# Patient Record
Sex: Female | Born: 1955
Health system: Southern US, Community
[De-identification: ages and names within clinical notes are randomized; demographics above are authoritative.]

## PROBLEM LIST (undated history)

## (undated) DIAGNOSIS — K449 Diaphragmatic hernia without obstruction or gangrene: Secondary | ICD-10-CM

## (undated) DIAGNOSIS — I7300 Raynaud's syndrome without gangrene: Secondary | ICD-10-CM

## (undated) DIAGNOSIS — E119 Type 2 diabetes mellitus without complications: Secondary | ICD-10-CM

## (undated) DIAGNOSIS — E785 Hyperlipidemia, unspecified: Secondary | ICD-10-CM

## (undated) DIAGNOSIS — C50919 Malignant neoplasm of unspecified site of unspecified female breast: Secondary | ICD-10-CM

## (undated) DIAGNOSIS — F419 Anxiety disorder, unspecified: Secondary | ICD-10-CM

## (undated) DIAGNOSIS — K219 Gastro-esophageal reflux disease without esophagitis: Secondary | ICD-10-CM

## (undated) DIAGNOSIS — Z923 Personal history of irradiation: Secondary | ICD-10-CM

## (undated) DIAGNOSIS — N809 Endometriosis, unspecified: Secondary | ICD-10-CM

## (undated) DIAGNOSIS — B009 Herpesviral infection, unspecified: Secondary | ICD-10-CM

## (undated) DIAGNOSIS — R7303 Prediabetes: Secondary | ICD-10-CM

## (undated) DIAGNOSIS — T7840XA Allergy, unspecified, initial encounter: Secondary | ICD-10-CM

## (undated) DIAGNOSIS — K838 Other specified diseases of biliary tract: Secondary | ICD-10-CM

## (undated) DIAGNOSIS — D649 Anemia, unspecified: Secondary | ICD-10-CM

## (undated) DIAGNOSIS — J45909 Unspecified asthma, uncomplicated: Secondary | ICD-10-CM

## (undated) DIAGNOSIS — I10 Essential (primary) hypertension: Secondary | ICD-10-CM

## (undated) DIAGNOSIS — E039 Hypothyroidism, unspecified: Secondary | ICD-10-CM

## (undated) DIAGNOSIS — Z803 Family history of malignant neoplasm of breast: Secondary | ICD-10-CM

## (undated) DIAGNOSIS — Z8489 Family history of other specified conditions: Secondary | ICD-10-CM

## (undated) DIAGNOSIS — G43109 Migraine with aura, not intractable, without status migrainosus: Secondary | ICD-10-CM

## (undated) DIAGNOSIS — Z5189 Encounter for other specified aftercare: Secondary | ICD-10-CM

## (undated) DIAGNOSIS — M1990 Unspecified osteoarthritis, unspecified site: Secondary | ICD-10-CM

## (undated) DIAGNOSIS — C801 Malignant (primary) neoplasm, unspecified: Secondary | ICD-10-CM

## (undated) DIAGNOSIS — N879 Dysplasia of cervix uteri, unspecified: Secondary | ICD-10-CM

## (undated) DIAGNOSIS — Z8042 Family history of malignant neoplasm of prostate: Secondary | ICD-10-CM

## (undated) DIAGNOSIS — M8580 Other specified disorders of bone density and structure, unspecified site: Secondary | ICD-10-CM

## (undated) HISTORY — DX: Anemia, unspecified: D64.9

## (undated) HISTORY — PX: COLONOSCOPY: SHX174

## (undated) HISTORY — DX: Other specified disorders of bone density and structure, unspecified site: M85.80

## (undated) HISTORY — DX: Unspecified asthma, uncomplicated: J45.909

## (undated) HISTORY — DX: Encounter for other specified aftercare: Z51.89

## (undated) HISTORY — PX: ABDOMINAL HYSTERECTOMY: SHX81

## (undated) HISTORY — DX: Essential (primary) hypertension: I10

## (undated) HISTORY — DX: Endometriosis, unspecified: N80.9

## (undated) HISTORY — DX: Other specified diseases of biliary tract: K83.8

## (undated) HISTORY — DX: Hypothyroidism, unspecified: E03.9

## (undated) HISTORY — PX: BREAST SURGERY: SHX581

## (undated) HISTORY — DX: Diaphragmatic hernia without obstruction or gangrene: K44.9

## (undated) HISTORY — DX: Gastro-esophageal reflux disease without esophagitis: K21.9

## (undated) HISTORY — DX: Allergy, unspecified, initial encounter: T78.40XA

## (undated) HISTORY — DX: Family history of malignant neoplasm of prostate: Z80.42

## (undated) HISTORY — PX: LAPAROSCOPIC ASSISTED VAGINAL HYSTERECTOMY: SHX5398

## (undated) HISTORY — DX: Type 2 diabetes mellitus without complications: E11.9

## (undated) HISTORY — DX: Hyperlipidemia, unspecified: E78.5

## (undated) HISTORY — DX: Migraine with aura, not intractable, without status migrainosus: G43.109

## (undated) HISTORY — PX: BREAST EXCISIONAL BIOPSY: SUR124

## (undated) HISTORY — DX: Dysplasia of cervix uteri, unspecified: N87.9

## (undated) HISTORY — DX: Family history of malignant neoplasm of breast: Z80.3

## (undated) HISTORY — PX: COLPOSCOPY: SHX161

## (undated) HISTORY — PX: UPPER GASTROINTESTINAL ENDOSCOPY: SHX188

## (undated) HISTORY — DX: Herpesviral infection, unspecified: B00.9

---
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
## 1983-11-24 HISTORY — PX: CERVICAL CONE BIOPSY: SUR198

## 2000-05-10 ENCOUNTER — Encounter: Admission: RE | Admit: 2000-05-10 | Discharge: 2000-05-10 | Payer: Self-pay | Admitting: Obstetrics and Gynecology

## 2000-05-10 ENCOUNTER — Encounter: Payer: Self-pay | Admitting: Obstetrics and Gynecology

## 2000-09-16 ENCOUNTER — Other Ambulatory Visit: Admission: RE | Admit: 2000-09-16 | Discharge: 2000-09-16 | Payer: Self-pay | Admitting: Obstetrics and Gynecology

## 2001-03-01 ENCOUNTER — Encounter: Payer: Self-pay | Admitting: Obstetrics and Gynecology

## 2001-03-01 ENCOUNTER — Encounter: Admission: RE | Admit: 2001-03-01 | Discharge: 2001-03-01 | Payer: Self-pay | Admitting: Obstetrics and Gynecology

## 2001-09-20 ENCOUNTER — Other Ambulatory Visit: Admission: RE | Admit: 2001-09-20 | Discharge: 2001-09-20 | Payer: Self-pay | Admitting: Obstetrics and Gynecology

## 2002-06-22 ENCOUNTER — Encounter: Payer: Self-pay | Admitting: Obstetrics and Gynecology

## 2002-06-22 ENCOUNTER — Encounter: Admission: RE | Admit: 2002-06-22 | Discharge: 2002-06-22 | Payer: Self-pay | Admitting: Obstetrics and Gynecology

## 2002-12-29 ENCOUNTER — Other Ambulatory Visit: Admission: RE | Admit: 2002-12-29 | Discharge: 2002-12-29 | Payer: Self-pay | Admitting: Obstetrics and Gynecology

## 2003-01-12 ENCOUNTER — Encounter: Payer: Self-pay | Admitting: Obstetrics and Gynecology

## 2003-01-12 ENCOUNTER — Encounter: Admission: RE | Admit: 2003-01-12 | Discharge: 2003-01-12 | Payer: Self-pay | Admitting: Obstetrics and Gynecology

## 2003-06-25 ENCOUNTER — Encounter: Admission: RE | Admit: 2003-06-25 | Discharge: 2003-06-25 | Payer: Self-pay | Admitting: Obstetrics and Gynecology

## 2003-06-25 ENCOUNTER — Encounter: Payer: Self-pay | Admitting: Obstetrics and Gynecology

## 2003-11-24 HISTORY — PX: COLONOSCOPY: SHX174

## 2004-04-02 ENCOUNTER — Other Ambulatory Visit: Admission: RE | Admit: 2004-04-02 | Discharge: 2004-04-02 | Payer: Self-pay | Admitting: Obstetrics and Gynecology

## 2004-08-04 ENCOUNTER — Encounter: Admission: RE | Admit: 2004-08-04 | Discharge: 2004-08-04 | Payer: Self-pay | Admitting: Obstetrics and Gynecology

## 2004-08-25 ENCOUNTER — Encounter: Admission: RE | Admit: 2004-08-25 | Discharge: 2004-08-25 | Payer: Self-pay | Admitting: Obstetrics and Gynecology

## 2005-01-12 ENCOUNTER — Ambulatory Visit: Payer: Self-pay | Admitting: Family Medicine

## 2005-04-25 ENCOUNTER — Other Ambulatory Visit: Payer: Self-pay

## 2005-04-25 ENCOUNTER — Emergency Department: Payer: Self-pay | Admitting: Internal Medicine

## 2005-05-01 ENCOUNTER — Other Ambulatory Visit: Admission: RE | Admit: 2005-05-01 | Discharge: 2005-05-01 | Payer: Self-pay | Admitting: Obstetrics and Gynecology

## 2005-07-23 ENCOUNTER — Ambulatory Visit: Payer: Self-pay | Admitting: Gastroenterology

## 2005-09-28 ENCOUNTER — Encounter: Admission: RE | Admit: 2005-09-28 | Discharge: 2005-09-28 | Payer: Self-pay | Admitting: Obstetrics and Gynecology

## 2005-10-23 ENCOUNTER — Ambulatory Visit: Payer: Self-pay | Admitting: Family Medicine

## 2005-10-27 ENCOUNTER — Ambulatory Visit: Payer: Self-pay | Admitting: Family Medicine

## 2005-10-30 ENCOUNTER — Ambulatory Visit: Payer: Self-pay | Admitting: Family Medicine

## 2005-11-05 ENCOUNTER — Emergency Department (HOSPITAL_COMMUNITY): Admission: EM | Admit: 2005-11-05 | Discharge: 2005-11-05 | Payer: Self-pay | Admitting: *Deleted

## 2006-01-20 ENCOUNTER — Ambulatory Visit: Payer: Self-pay | Admitting: Family Medicine

## 2006-10-22 ENCOUNTER — Encounter: Admission: RE | Admit: 2006-10-22 | Discharge: 2006-10-22 | Payer: Self-pay | Admitting: Obstetrics and Gynecology

## 2007-01-24 ENCOUNTER — Ambulatory Visit: Payer: Self-pay | Admitting: Family Medicine

## 2007-01-24 LAB — CONVERTED CEMR LAB
Basophils Relative: 1 % (ref 0.0–1.0)
Eosinophils Relative: 1.3 % (ref 0.0–5.0)
Free T4: 0.9 ng/dL (ref 0.6–1.6)
MCV: 94.2 fL (ref 78.0–100.0)
Monocytes Relative: 10 % (ref 3.0–11.0)
Neutro Abs: 2.3 10*3/uL (ref 1.4–7.7)
Neutrophils Relative %: 48.5 % (ref 43.0–77.0)
Platelets: 295 10*3/uL (ref 150–400)
RDW: 11.4 % — ABNORMAL LOW (ref 11.5–14.6)
Rhuematoid fact SerPl-aCnc: <20 intl units/mL — ABNORMAL LOW (ref 0.0–20.0)
Sed Rate: 17 mm/hr (ref 0–25)

## 2007-01-26 ENCOUNTER — Ambulatory Visit: Payer: Self-pay | Admitting: Family Medicine

## 2007-07-02 ENCOUNTER — Emergency Department: Payer: Self-pay | Admitting: Internal Medicine

## 2007-11-08 ENCOUNTER — Encounter: Admission: RE | Admit: 2007-11-08 | Discharge: 2007-11-08 | Payer: Self-pay | Admitting: Obstetrics and Gynecology

## 2007-11-11 ENCOUNTER — Ambulatory Visit: Payer: Self-pay | Admitting: Family Medicine

## 2007-11-11 DIAGNOSIS — G43109 Migraine with aura, not intractable, without status migrainosus: Secondary | ICD-10-CM | POA: Insufficient documentation

## 2008-01-12 ENCOUNTER — Ambulatory Visit: Payer: Self-pay | Admitting: Family Medicine

## 2008-01-12 LAB — CONVERTED CEMR LAB
Bilirubin Urine: NEGATIVE
Casts: 0 /lpf
Glucose, Urine, Semiquant: NEGATIVE
Ketones, urine, test strip: NEGATIVE
Nitrite: NEGATIVE
Specific Gravity, Urine: 1.01
pH: 7

## 2008-02-09 ENCOUNTER — Telehealth: Payer: Self-pay | Admitting: Family Medicine

## 2008-02-13 ENCOUNTER — Telehealth: Payer: Self-pay | Admitting: Family Medicine

## 2008-04-17 ENCOUNTER — Telehealth: Payer: Self-pay | Admitting: Family Medicine

## 2008-05-07 ENCOUNTER — Ambulatory Visit: Payer: Self-pay | Admitting: Family Medicine

## 2008-05-07 DIAGNOSIS — E039 Hypothyroidism, unspecified: Secondary | ICD-10-CM | POA: Insufficient documentation

## 2008-05-07 DIAGNOSIS — M810 Age-related osteoporosis without current pathological fracture: Secondary | ICD-10-CM

## 2008-05-07 DIAGNOSIS — M8580 Other specified disorders of bone density and structure, unspecified site: Secondary | ICD-10-CM | POA: Insufficient documentation

## 2008-05-07 HISTORY — DX: Age-related osteoporosis without current pathological fracture: M81.0

## 2008-05-09 LAB — CONVERTED CEMR LAB
Basophils Absolute: 0 10*3/uL (ref 0.0–0.1)
Basophils Relative: 0.8 % (ref 0.0–1.0)
Cholesterol: 176 mg/dL (ref 0–200)
Lymphocytes Relative: 34 % (ref 12.0–46.0)
Monocytes Relative: 9.2 % (ref 3.0–12.0)
Neutrophils Relative %: 54.9 % (ref 43.0–77.0)
Platelets: 288 10*3/uL (ref 150–400)
RDW: 11.5 % (ref 11.5–14.6)
TSH: 2.19 microintl units/mL (ref 0.35–5.50)
Triglycerides: 75 mg/dL (ref 0–149)
VLDL: 15 mg/dL (ref 0–40)
Vit D, 1,25-Dihydroxy: 37 (ref 30–89)
WBC: 5.2 10*3/uL (ref 4.5–10.5)

## 2008-07-06 ENCOUNTER — Ambulatory Visit: Payer: Self-pay

## 2008-08-23 HISTORY — PX: ROTATOR CUFF REPAIR: SHX139

## 2008-08-31 ENCOUNTER — Ambulatory Visit: Payer: Self-pay | Admitting: General Practice

## 2008-09-12 ENCOUNTER — Ambulatory Visit: Payer: Self-pay | Admitting: General Practice

## 2008-11-14 ENCOUNTER — Telehealth: Payer: Self-pay | Admitting: Family Medicine

## 2008-11-27 ENCOUNTER — Ambulatory Visit: Payer: Self-pay | Admitting: Family Medicine

## 2009-01-24 ENCOUNTER — Telehealth (INDEPENDENT_AMBULATORY_CARE_PROVIDER_SITE_OTHER): Payer: Self-pay | Admitting: *Deleted

## 2009-01-24 ENCOUNTER — Telehealth: Payer: Self-pay | Admitting: Family Medicine

## 2009-01-28 ENCOUNTER — Ambulatory Visit: Payer: Self-pay | Admitting: Family Medicine

## 2009-01-31 ENCOUNTER — Telehealth: Payer: Self-pay | Admitting: Family Medicine

## 2009-02-04 ENCOUNTER — Encounter: Admission: RE | Admit: 2009-02-04 | Discharge: 2009-02-04 | Payer: Self-pay | Admitting: Obstetrics and Gynecology

## 2009-02-07 ENCOUNTER — Telehealth (INDEPENDENT_AMBULATORY_CARE_PROVIDER_SITE_OTHER): Payer: Self-pay | Admitting: *Deleted

## 2009-03-04 ENCOUNTER — Telehealth: Payer: Self-pay | Admitting: Family Medicine

## 2009-03-20 ENCOUNTER — Telehealth: Payer: Self-pay | Admitting: Family Medicine

## 2009-03-21 ENCOUNTER — Telehealth: Payer: Self-pay | Admitting: Family Medicine

## 2009-04-11 ENCOUNTER — Ambulatory Visit: Payer: Self-pay | Admitting: Family Medicine

## 2009-04-11 ENCOUNTER — Encounter: Payer: Self-pay | Admitting: Family Medicine

## 2009-08-21 ENCOUNTER — Ambulatory Visit: Payer: Self-pay | Admitting: Family Medicine

## 2009-08-22 ENCOUNTER — Encounter: Payer: Self-pay | Admitting: Family Medicine

## 2009-08-23 ENCOUNTER — Telehealth: Payer: Self-pay | Admitting: Family Medicine

## 2009-08-23 LAB — CONVERTED CEMR LAB
ALT: 17 units/L (ref 0–35)
AST: 19 units/L (ref 0–37)
Albumin: 4.1 g/dL (ref 3.5–5.2)
Alkaline Phosphatase: 45 units/L (ref 39–117)
Basophils Absolute: 0 10*3/uL (ref 0.0–0.1)
CO2: 32 meq/L (ref 19–32)
Calcium: 9.6 mg/dL (ref 8.4–10.5)
Cholesterol: 168 mg/dL (ref 0–200)
Creatinine, Ser: 0.7 mg/dL (ref 0.4–1.2)
Eosinophils Relative: 1.6 % (ref 0.0–5.0)
Glucose, Bld: 98 mg/dL (ref 70–99)
Hemoglobin: 14.1 g/dL (ref 12.0–15.0)
LDL Cholesterol: 106 mg/dL — ABNORMAL HIGH (ref 0–99)
Lymphs Abs: 1.6 10*3/uL (ref 0.7–4.0)
MCHC: 35.8 g/dL (ref 30.0–36.0)
MCV: 94.6 fL (ref 78.0–100.0)
Monocytes Relative: 9.8 % (ref 3.0–12.0)
Potassium: 4.4 meq/L (ref 3.5–5.1)
RDW: 12 % (ref 11.5–14.6)
TSH: 2.18 microintl units/mL (ref 0.35–5.50)
Total CHOL/HDL Ratio: 3
Total Protein: 7.3 g/dL (ref 6.0–8.3)
Triglycerides: 65 mg/dL (ref 0.0–149.0)
VLDL: 13 mg/dL (ref 0.0–40.0)
Vit D, 25-Hydroxy: 41 ng/mL (ref 30–89)

## 2009-08-28 ENCOUNTER — Telehealth (INDEPENDENT_AMBULATORY_CARE_PROVIDER_SITE_OTHER): Payer: Self-pay | Admitting: *Deleted

## 2009-12-25 ENCOUNTER — Encounter: Admission: RE | Admit: 2009-12-25 | Discharge: 2009-12-25 | Payer: Self-pay | Admitting: Obstetrics and Gynecology

## 2010-05-01 ENCOUNTER — Ambulatory Visit: Payer: Self-pay | Admitting: Otolaryngology

## 2010-07-24 ENCOUNTER — Encounter: Admission: RE | Admit: 2010-07-24 | Discharge: 2010-07-24 | Payer: Self-pay | Admitting: Obstetrics and Gynecology

## 2010-08-28 ENCOUNTER — Ambulatory Visit: Payer: Self-pay | Admitting: Family Medicine

## 2010-09-16 ENCOUNTER — Ambulatory Visit: Payer: Self-pay | Admitting: Family Medicine

## 2010-09-16 DIAGNOSIS — K219 Gastro-esophageal reflux disease without esophagitis: Secondary | ICD-10-CM | POA: Insufficient documentation

## 2010-09-22 LAB — CONVERTED CEMR LAB
ALT: 26 units/L (ref 0–35)
AST: 20 units/L (ref 0–37)
Cholesterol: 203 mg/dL — ABNORMAL HIGH (ref 0–200)
Direct LDL: 121.8 mg/dL
Total CHOL/HDL Ratio: 3
Triglycerides: 124 mg/dL (ref 0.0–149.0)

## 2010-12-13 ENCOUNTER — Encounter: Payer: Self-pay | Admitting: Obstetrics and Gynecology

## 2010-12-14 ENCOUNTER — Encounter: Payer: Self-pay | Admitting: Obstetrics and Gynecology

## 2010-12-23 NOTE — Assessment & Plan Note (Signed)
Summary: F/U,REFILL MEDICATION/CLE   Vital Signs:  Patient profile:   55 year old female Height:      60.5 inches Weight:      125 pounds BMI:     24.10 Temp:     98.2 degrees F oral Pulse rate:   60 / minute Pulse rhythm:   regular BP sitting:   110 / 70  (left arm) Cuff size:   regular  Vitals Entered By: Selena Batten Dance CMA Duncan Dull) (September 16, 2010 12:14 PM) CC: Refill meds   History of Present Illness: here for f/u of osteopenia and hypothyroidism/ migraines   due for labs   feels like thyroid is about the same   still on actonel and is stable with dexa 5/10 -- was stable  stopped it based on the studies she has seen (was on it 3 years)   ca and vit D diet rich in ca and takes supplements  D total is 1400 international units    wt is up 8 lb-- 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 * Actonel 150 Monthly 2)  ! 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 gerd      neurology--- gyn- Dr Jackelyn Knife   Review of Systems General:  Denies fatigue, fever, loss of appetite, and malaise. Eyes:  Denies blurring and eye pain. CV:  Denies chest pain or discomfort, lightheadness, palpitations, and shortness of breath with exertion. Resp:  Denies cough and wheezing. GI:  Complains of indigestion; denies abdominal  pain, nausea, and vomiting; gets gerd symptoms without med . GU:  Denies dysuria. MS:  Denies joint pain, muscle aches, and cramps. Derm:  Denies itching, lesion(s), poor wound healing, and rash. Neuro:  Denies numbness and tingling. Psych:  Denies anxiety and depression. Endo:  Denies cold intolerance, excessive thirst, excessive urination, and heat intolerance. Heme:  Denies abnormal bruising and bleeding.  Physical Exam  General:  Well-developed,well-nourished,in no acute distress; alert,appropriate and cooperative throughout examination Head:  normocephalic, atraumatic, and no abnormalities observed.   Eyes:  vision grossly intact, pupils equal, pupils round, and pupils reactive to light.   Mouth:  pharynx pink and moist.   Neck:  supple with full rom and no masses or thyromegally, no JVD or carotid bruit  Chest Wall:  No deformities, masses, or tenderness noted. Lungs:  Normal respiratory effort, chest expands symmetrically. Lungs are clear to auscultation, no crackles or wheezes. Heart:  Normal rate and regular rhythm. S1 and S2 normal without gallop, murmur, click, rub or other extra sounds. Abdomen:  Bowel sounds positive,abdomen soft and non-tender without masses, organomegaly or hernias noted. Msk:  no kyphophosis  no acute joint changes Extremities:  No clubbing, cyanosis, edema, or deformity noted with normal full range of motion of all joints.   Neurologic:  gait normal and DTRs symmetrical and normal.  Skin:  Intact without suspicious lesions or rashes Cervical Nodes:  No lymphadenopathy noted Inguinal Nodes:  No significant adenopathy Psych:  normal affect, talkative and pleasant    Impression & Recommendations:  Problem # 1:  OSTEOPENIA (ICD-733.90) Assessment Unchanged pt choose to stop actonel out of fear after 3 y of tx is on hrt- to disc with gyn  rev ca andD check D level  dexa due in may The following medications were removed from the medication list:     Actonel 35 Mg Tabs (Risedronate sodium) .Marland Kitchen... 1 by mouth once weekly as directed Her updated medication list for this problem includes:    Calcium Carbonate-vitamin D 600-400 Mg-unit Tabs (Calcium carbonate-vitamin d) .Marland Kitchen... Take 1 tablet by mouth two times a day  Orders: Venipuncture (16109) TLB-Lipid Panel (80061-LIPID) TLB-TSH (Thyroid Stimulating Hormone) (84443-TSH) TLB-ALT (SGPT) (84460-ALT) TLB-AST (SGOT) (84450-SGOT) T-Vitamin D (25-Hydroxy) (60454-09811) Specimen Handling (91478) Prescription Created Electronically [id number])  Problem # 2:  HYPOTHYROIDISM (ICD-244.9) Assessment: Unchanged  clinically stable lab today and update Her updated medication list for this problem includes:    Levoxyl 50 Mcg Tabs (Levothyroxine sodium) .Marland Kitchen... Take 1 tablet by mouth once a day  Orders: Venipuncture (13086) TLB-Lipid Panel (80061-LIPID) TLB-TSH (Thyroid Stimulating Hormone) (84443-TSH) TLB-ALT (SGPT) (84460-ALT) TLB-AST (SGOT) (84450-SGOT) T-Vitamin D (25-Hydroxy) [phone]) Prescription Created Electronically [phone])  Labs Reviewed: TSH: 2.18 (08/21/2009)    Chol: 168 (08/21/2009)   HDL: 49.50 (08/21/2009)   LDL: 106 (08/21/2009)   TG: 65.0 (08/21/2009)  Problem # 3:  SCREENING FOR LIPOID DISORDERS (ICD-V77.91) Assessment: Comment Only  chol today- diet worse  Orders: Prescription Created Electronically [phone])  Problem # 4:  GERD (ICD-530.81) Assessment: Unchanged  pt aware that ppi inc risk of OP - but needs it at times (PPI) refilled  disc prev of 2nd ary problems  Her updated medication list for this problem includes:    Protonix 40 Mg Tbec (Pantoprazole sodium) .Marland Kitchen... Take 1 tablet by mouth once a day    Magnesium Oxide 400 Mg Caps (Magnesium oxide) .Marland Kitchen... Take one by mouth daily  Orders: Prescription Created Electronically [phone])  Complete Medication List: 1)  Levoxyl 50 Mcg Tabs (Levothyroxine sodium) .... Take 1 tablet by mouth once a day 2)  Nasonex 50  Mcg/act Susp (Mometasone furoate) .... Use 2 sprays once daily as needed 3)  Calcium Carbonate-vitamin D 600-400 Mg-unit Tabs (Calcium carbonate-vitamin d) .... Take 1 tablet by mouth two times a day 4)  Femhrt 1/5 1-5 Mg-mcg Tabs (Norethindrone-eth estradiol) .... Take 1 tablet by mouth once a day 5)  Protonix 40 Mg Tbec (Pantoprazole sodium) .... Take 1 tablet by mouth once a day 6)  Maxalt-mlt 5 Mg Tbdp (Rizatriptan benzoate) .... As needed 7)  Promethazine Hcl 25 Mg Tabs (Promethazine hcl) .... As needed 8)  Ketorolac Tromethamine 10 Mg Tabs (Ketorolac tromethamine) .Marland Kitchen.. 1 by mouth with food up to two times a day as needed headache 9)  Magnesium Oxide 400 Mg Caps (Magnesium oxide) .... Take one by mouth daily 10)  Atenolol 25 Mg Tabs (Atenolol) .... Take 1/2 by mouth two times a day 11)  Vitamin C 500 Mg Tabs (Ascorbic acid) .... Take 1 tablet by mouth once a day 12)  Baclofen 10 Mg Tabs (Baclofen) .... As needed for migraine prevention 13)  Fish Oil Oil (Fish oil) .... By mouth once daily 14)  Alprazolam 0.5 Mg Tabs (Alprazolam) .... 1/2 to 1 by mouth once daily as needed anxiety with travel  Patient Instructions: 1)  labs today for thyroid and cholesterol and vitamin d 2)  I will take actonel off your list  3)  you will be due for dexa (I think ) in 03/2011 4)  take protonix as often as you need it  Prescriptions: PROTONIX 40 MG  TBEC (PANTOPRAZOLE SODIUM) Take 1 tablet by mouth once a day  #30 x 11   Entered and Authorized by:   Judith Part MD   Signed by:   Judith Part MD on 09/16/2010   Method used:   [facility]        [facility] ([facility]       [address]       [place], [place]  [address]       Ph: [phone] or [phone]       Fax: [phone]   RxID:   [id number] NASONEX 50 MCG/ACT  SUSP (MOMETASONE FUROATE) Use 2 sprays once daily as needed  #1 mdi x 11   Entered and Authorized by:   Judith Part MD   Signed by:   Judith Part MD on 09/16/2010   Method used:   [facility]        [facility] (retail)       [address]       [place], [place]  [address]       Ph: [phone] or [phone]       Fax: [phone]   RxID:   [id number] LEVOXYL 50 MCG  TABS (LEVOTHYROXINE SODIUM) Take 1 tablet by mouth once a day  #30 x 11   Entered and Authorized by:   Judith Part MD   Signed by:   Judith Part MD on 09/16/2010   Method used:   [facility]        [id number]       [address]       [place], [place]  [address]       Ph: [phone] or [phone]       Fax: [phone]   RxID:   [id number]    Orders Added: 1)  Venipuncture [05397] 2)  TLB-Lipid Panel [80061-LIPID] 3)  TLB-TSH (Thyroid Stimulating Hormone) [84443-TSH] 4)  TLB-ALT (SGPT) [84460-ALT] 5)  TLB-AST (SGOT) [84450-SGOT] 6)  T-Vitamin D (25-Hydroxy) [67341-93790] 7)  Specimen Handling [99000] 8)  Prescription Created Electronically [G8553] 9)  Est. Patient Level IV [24097]      Current Allergies (reviewed today): ! * ACTONEL 150 MONTHLY ! HYDROCODONE CODEINE PHOSPHATE (CODEINE PHOSPHATE) * DECONGESTANTS

## 2010-12-23 NOTE — Assessment & Plan Note (Signed)
Summary: FLU SHOT/CLE  Nurse Visit   Allergies: 1)  ! * Actonel 150 Monthly 2)  ! Hydrocodone 3)  Codeine Phosphate (Codeine Phosphate) 4)  * Decongestants  Orders Added: 1)  Admin 1st Vaccine [90471] 2)  Flu Vaccine 52yrs + [16109]   Flu Vaccine Consent Questions     Do you have a history of severe allergic reactions to this vaccine? no    Any prior history of allergic reactions to egg and/or gelatin? no    Do you have a sensitivity to the preservative Thimersol? no    Do you have a past history of Guillan-Barre Syndrome? no    Do you currently have an acute febrile illness? no    Have you ever had a severe reaction to latex? no    Vaccine information given and explained to patient? yes    Are you currently pregnant? no    Lot Number:[id number]   Exp Date:05/23/2011   Site Given  Left Deltoid IMu

## 2010-12-23 NOTE — Miscellaneous (Signed)
Summary: Controlled Substance Agreement  Controlled Substance Agreement   Imported By: Lanelle Bal 09/23/2010 12:44:14  _____________________________________________________________________  External Attachment:    Type:   Image     Comment:   External Document

## 2011-02-24 ENCOUNTER — Encounter: Payer: Self-pay | Admitting: Family Medicine

## 2011-02-25 ENCOUNTER — Other Ambulatory Visit: Payer: Self-pay | Admitting: Obstetrics and Gynecology

## 2011-02-25 DIAGNOSIS — Z09 Encounter for follow-up examination after completed treatment for conditions other than malignant neoplasm: Secondary | ICD-10-CM

## 2011-02-26 ENCOUNTER — Ambulatory Visit (INDEPENDENT_AMBULATORY_CARE_PROVIDER_SITE_OTHER): Payer: Commercial Managed Care - PPO | Admitting: Family Medicine

## 2011-02-26 ENCOUNTER — Encounter: Payer: Self-pay | Admitting: Family Medicine

## 2011-02-26 VITALS — BP 108/80 | HR 64 | Temp 98.5°F | Wt 125.0 lb

## 2011-02-26 DIAGNOSIS — K219 Gastro-esophageal reflux disease without esophagitis: Secondary | ICD-10-CM

## 2011-02-26 MED ORDER — ESOMEPRAZOLE MAGNESIUM 40 MG PO CPDR: 40.0000 mg | DELAYED_RELEASE_CAPSULE | Freq: Every day | ORAL | Status: DC

## 2011-02-26 NOTE — Progress Notes (Signed)
  Subjective:    Patient ID: Jennifer [name], female    DOB: [birth year], 55 y.o.   MRN: [id number]  HPI CC: GERD?  For the last 1 1/2 wks worsening reflux.  Started as "pinching" pain with minor electric shock middle of chest.  Very uncomfortable.  Having 3-4 episodes/day.  Then progressed to burning sensation mid chest that radiated outward.  This week more reflux after eating.  Having reflux after typical foods.  Today had whole grain toast, jelly, skim milk and had reflux.  + h/o hiatal hernia, small.  Not exhertional, not positional.  Only other difference is started aspirin daily.  Stopped now 1 wk ago but still having reflux remaining.  No weight changes despite increased weight recently.  No early satiety.  No dysphagia, odynophagia.  Adding pepcid prn on top of daily protonix.  EGD 2006 by Dr. Henrene Hawking showed normal duodenum, normal esophagus, small hiatal hernia.  Non-bleeding erythematous gastropathy.  Has appt scheduled for May.  Has been on prevacid in past but didn't cut symptoms.  Medications and allergies reviewed and updated as above. PMHx reviewed.  Review of Systems Per HPI    Objective:   Physical Exam  Constitutional: She appears well-developed and well-nourished. No distress.  HENT:  Head: Normocephalic and atraumatic.  Mouth/Throat: Oropharynx is clear and moist. No oropharyngeal exudate.  Eyes: Conjunctivae are normal. Pupils are equal, round, and reactive to light.  Neck: Normal range of motion. Neck supple.  Cardiovascular: Normal rate, regular rhythm, normal heart sounds and intact distal pulses.   No murmur heard. Pulmonary/Chest: Effort normal and breath sounds normal. No respiratory distress. She has no wheezes. She has no rales.  Abdominal: Soft. Bowel sounds are normal. She exhibits no distension and no mass. There is no tenderness. There is no rebound and no guarding.  Lymphadenopathy:    She has no cervical adenopathy.  Skin: Skin is warm and dry. No rash  noted.          Assessment & Plan:

## 2011-02-26 NOTE — Patient Instructions (Signed)
I think this is combination of weight gain and recent aspirin use causing irritation of stomach lining (mild gastritis). Increase ppi to nexium dailyl. GERD precautions: Head of bed elevated. Avoidance of citrus, fatty foods, chocolate, peppermint, and excessive alcohol, along with sodas, orange juice (acidic drinks) At least a few hours between dinner and bed, minimize naps after eating. Good to see you today, call us if not improving as expected.

## 2011-02-26 NOTE — Assessment & Plan Note (Signed)
Worsened after recent aspirin use Also anticipate worsened with weight gain. No red flags. F/u with GI if continued.   For now, treat with increasing PPI to nexium from protonix (had failed prevacid in past as well.)   GERD precautions discussed. RTC if worsening

## 2011-03-17 ENCOUNTER — Ambulatory Visit
Admission: RE | Admit: 2011-03-17 | Discharge: 2011-03-17 | Disposition: A | Discharge status: Home / Self Care | Payer: Commercial Managed Care - PPO | Source: Ambulatory Visit | Attending: Obstetrics and Gynecology | Admitting: Obstetrics and Gynecology

## 2011-03-17 ENCOUNTER — Other Ambulatory Visit: Payer: Self-pay | Admitting: Obstetrics and Gynecology

## 2011-03-17 DIAGNOSIS — Z09 Encounter for follow-up examination after completed treatment for conditions other than malignant neoplasm: Secondary | ICD-10-CM

## 2011-04-10 NOTE — Assessment & Plan Note (Signed)
[facility]                                 ON-CALL NOTE   Jennifer [name], Jennifer [name]                           MRN:          [id number]  DATE:07/29/2008                            DOB:          24-Jan-1956    DATE OF INTERACTION:  July 29, 2008. At 10:01 p.m.  Phone number [phone].   OBJECTIVE:  The patient has suffered from migraine for a long time.  She  was recently started on Topamax by Neurology 12 mg for 2 weeks and then  it is to go up to 25 mg for 2 weeks and eventually be on 50 mg.  She has  been on the 25 for 2 days now and has had nausea, feeling nauseated and  daily headaches, and the headaches are worse now.  She is on the 25 as  opposed to the 12 mg, would like to stop her medication.  Objective, is  difficulty with Topamax.   PLAN:  My opinion would be to go back to 12 mg and then call Neurology  when she gets a chance and see what they have to say about what she  should do.   PRIMARY CARE Idaliz Tinkle:  Idamae Schuller A. Tower, MD and home office is [facility].     Arta Silence, MD  Electronically Signed    RNS/MedQ  DD: 07/29/2008  DT: 07/30/2008  Job #: [id number]

## 2011-04-21 ENCOUNTER — Other Ambulatory Visit: Payer: Self-pay | Admitting: *Deleted

## 2011-04-21 MED ORDER — VALACYCLOVIR HCL 1 G PO TABS: 2000.0000 mg | ORAL_TABLET | Freq: Two times a day (BID) | ORAL | Status: AC

## 2011-04-21 NOTE — Telephone Encounter (Signed)
Rx called in as directed.   

## 2011-04-21 NOTE — Telephone Encounter (Signed)
Px written for call in   

## 2011-04-21 NOTE — Telephone Encounter (Signed)
Can this be refilled? Medication is not on med list.

## 2011-04-21 NOTE — Telephone Encounter (Signed)
Opened in error

## 2011-04-28 ENCOUNTER — Telehealth: Payer: Self-pay | Admitting: *Deleted

## 2011-04-28 DIAGNOSIS — B001 Herpesviral vesicular dermatitis: Secondary | ICD-10-CM | POA: Insufficient documentation

## 2011-04-28 MED ORDER — VALACYCLOVIR HCL 1 G PO TABS: 2000.0000 mg | ORAL_TABLET | Freq: Two times a day (BID) | ORAL | Status: AC

## 2011-04-28 NOTE — Telephone Encounter (Signed)
Left message for pt to call back. Medication phoned to [facility] [address] [facility] as instructed.

## 2011-04-28 NOTE — Telephone Encounter (Signed)
For some reason I cannot find valtrex on her med list .... ? Do not know why-- wanted to see dose and last refil  Any hints?  Also -- how often is she having outbreaks ? -- perhaps we need to consider daily suppresive therapy Let me know -thanks

## 2011-04-28 NOTE — Telephone Encounter (Signed)
Thanks for finding that  I reviewed her derm note-  In agreement that if these episodes happen any more often we need to discuss preventative daily therapy Make sure to wear lip balm with sunscreen Px written for call in

## 2011-04-28 NOTE — Telephone Encounter (Signed)
Pt calling for a refill of valtrex, she states she has been having breakouts frequently and the quantity given for her last refill wasn't enough. She request an increased quantity called in to pharmacy.

## 2011-04-28 NOTE — Telephone Encounter (Signed)
I looked under chart review then meds tab and when clicked off current med the refill was there for 04/20/12. (copy on your shelf) Not sure why it is not showing up on med list. Spoke with pt and she has outbreaks about every 2 weeks. The longest she could remember with no outbreak was 1 month. Pt said sometimes her lip swells also so pt said if you need more test she is agreeable. Pt saw dermatologist Dr Terri Piedra the last time was 04/28/10. Copy of Dr Dorita Sciara note is on your shelf in the in box. Pt request a refill with more than 4  Tablets at one time because co pay is the same and with frequency of outbreaks pt said is expensive. Pt does not want to take preventative med on a daily basis also. [facility] [address]Please advise.

## 2011-04-29 NOTE — Telephone Encounter (Signed)
Left message for pt to call back  °

## 2011-04-30 ENCOUNTER — Other Ambulatory Visit: Payer: Self-pay | Admitting: *Deleted

## 2011-04-30 MED ORDER — LEVOTHYROXINE SODIUM 50 MCG PO TABS: 50.0000 ug | ORAL_TABLET | Freq: Every day | ORAL | Status: DC

## 2011-05-01 NOTE — Telephone Encounter (Signed)
Patient notified as instructed by telephone. 

## 2011-05-29 ENCOUNTER — Telehealth: Payer: Self-pay | Admitting: *Deleted

## 2011-05-29 MED ORDER — VALACYCLOVIR HCL 500 MG PO TABS: 500.0000 mg | ORAL_TABLET | Freq: Every day | ORAL | Status: AC

## 2011-05-29 NOTE — Telephone Encounter (Signed)
Px sent electronically  

## 2011-05-29 NOTE — Telephone Encounter (Signed)
Patient says that she was told to take valacyclovir every day as a preventative and she says it was working great, and she wasn't having any outbreaks, but now she is out of medication and is noticing some outbreaks. She is asking if she can get this refilled. [facility] [address]

## 2011-06-16 ENCOUNTER — Other Ambulatory Visit: Payer: Self-pay | Admitting: *Deleted

## 2011-06-16 MED ORDER — ALPRAZOLAM 0.5 MG PO TABS: 0.2500 mg | ORAL_TABLET | Freq: Every day | ORAL | Status: DC | PRN

## 2011-06-16 NOTE — Telephone Encounter (Signed)
Px written for call in   

## 2011-06-16 NOTE — Telephone Encounter (Signed)
Pt states she will be doing some traveling and is asking for a refill.

## 2011-06-16 NOTE — Telephone Encounter (Signed)
Patient notified as instructed by telephone.Medication phoned to [facility] as instructed.  

## 2011-10-08 ENCOUNTER — Other Ambulatory Visit: Payer: Self-pay

## 2011-10-08 MED ORDER — LEVOTHYROXINE SODIUM 50 MCG PO TABS: 50.0000 ug | ORAL_TABLET | Freq: Every day | ORAL | Status: DC

## 2011-10-08 NOTE — Telephone Encounter (Signed)
[facility] faxed refill request for Levothyroxine 50 mcg # 30 x 0 with note pt needs to call for appt. Last filled 09/03/11 and pt last seen 09/16/10.

## 2011-10-30 ENCOUNTER — Other Ambulatory Visit: Payer: Self-pay | Admitting: Internal Medicine

## 2011-10-30 NOTE — Telephone Encounter (Signed)
Message left for patient to return my call.  

## 2011-10-30 NOTE — Telephone Encounter (Signed)
If not in chart ask her what she uses it for - ?when last refil was thanks

## 2011-10-30 NOTE — Telephone Encounter (Signed)
Patient called and wanted to know if she could get a refill on her Desonide Cream 5%.  I didn't see it in her chart.  Please advise.

## 2011-11-03 MED ORDER — DESONIDE 0.05 % EX CREA: TOPICAL_CREAM | CUTANEOUS | Status: DC

## 2011-11-03 NOTE — Telephone Encounter (Signed)
Left v/m for pt to call back. Need to verify pharmacy also.

## 2011-11-03 NOTE — Telephone Encounter (Signed)
Px written for call in   Did not know what pharmacy

## 2011-11-03 NOTE — Telephone Encounter (Signed)
Patient notified as instructed by telephone.Medication phoned to [facility] as instructed.  

## 2011-11-03 NOTE — Telephone Encounter (Signed)
Pt called back the rx she is requesting is for eczema, she was prescribed rx from and old dermatologist that she no longer sees.

## 2011-11-04 ENCOUNTER — Telehealth: Payer: Self-pay | Admitting: *Deleted

## 2011-11-04 MED ORDER — ALPRAZOLAM 0.5 MG PO TABS: 0.2500 mg | ORAL_TABLET | Freq: Every day | ORAL | Status: DC | PRN

## 2011-11-04 NOTE — Telephone Encounter (Signed)
Px written for call in   

## 2011-11-04 NOTE — Telephone Encounter (Signed)
Patient notified as instructed by telephone. Pt said Dr Milinda Antis has already called in the Desonide cream to [facility]. Pt is now requesting refill on alprazolam 0.5 mg to be called to [facility] if pt needs it during the holidays.Pt will ck with [facility] later tonight for refill.

## 2011-11-04 NOTE — Telephone Encounter (Signed)
Patient called to let Dr. Milinda Antis know that the cream she requested (did not give name of cream) was not prescribed by Dr. Milinda Antis but by her Dermatologist.  She is also requesting a Rx for Alprazolam because she will be traveling during the holidays.  Please advise.

## 2011-11-04 NOTE — Telephone Encounter (Signed)
Medication phoned [facility] as instructed.Patient notified as instructed by telephone.

## 2011-11-04 NOTE — Telephone Encounter (Signed)
I'm so sorry - but ask her the name and strength of the cream and what it is used for  Let me know if she still sees the dermatologist for that problem  It may be in the record but I cannot find it -thanks Then will do refils

## 2011-11-05 ENCOUNTER — Other Ambulatory Visit: Payer: Self-pay | Admitting: Family Medicine

## 2011-11-06 ENCOUNTER — Other Ambulatory Visit: Payer: Self-pay

## 2011-11-06 ENCOUNTER — Other Ambulatory Visit (INDEPENDENT_AMBULATORY_CARE_PROVIDER_SITE_OTHER): Payer: Commercial Managed Care - PPO

## 2011-11-06 DIAGNOSIS — E039 Hypothyroidism, unspecified: Secondary | ICD-10-CM

## 2011-11-06 MED ORDER — LEVOTHYROXINE SODIUM 50 MCG PO TABS: 50.0000 ug | ORAL_TABLET | Freq: Every day | ORAL | Status: DC

## 2011-11-06 NOTE — Telephone Encounter (Signed)
Jennifer [name] from front office brought note that pt needs Levothyroxine 50 mcg filled today. Her insurance expires tonight at midnight. Last filled 10/08/11 and pt last seen 10/17/10.Please advise.

## 2011-11-06 NOTE — Telephone Encounter (Signed)
Spoke with patient and advised results rx sent to pharmacy by e-script Patient will come by now and get labs done.

## 2011-11-06 NOTE — Telephone Encounter (Signed)
Needs tsh drawn -- can she come in by the end of the day? -- tsh for hypothyroid Px written for call in  Because I don't know what pharmacy

## 2011-11-07 LAB — TSH: TSH: 0.63 u[IU]/mL (ref 0.350–4.500)

## 2011-11-11 ENCOUNTER — Other Ambulatory Visit: Payer: Commercial Managed Care - PPO

## 2011-11-30 ENCOUNTER — Ambulatory Visit: Payer: Commercial Managed Care - PPO | Admitting: Family Medicine

## 2011-12-08 ENCOUNTER — Ambulatory Visit: Payer: Commercial Managed Care - PPO | Admitting: Family Medicine

## 2011-12-11 ENCOUNTER — Ambulatory Visit: Payer: Commercial Managed Care - PPO | Admitting: Family Medicine

## 2011-12-11 ENCOUNTER — Ambulatory Visit (INDEPENDENT_AMBULATORY_CARE_PROVIDER_SITE_OTHER): Payer: PRIVATE HEALTH INSURANCE | Admitting: Family Medicine

## 2011-12-11 ENCOUNTER — Encounter: Payer: Self-pay | Admitting: Family Medicine

## 2011-12-11 VITALS — BP 112/70 | HR 61 | Temp 98.3°F | Wt 125.2 lb

## 2011-12-11 DIAGNOSIS — A084 Viral intestinal infection, unspecified: Secondary | ICD-10-CM | POA: Insufficient documentation

## 2011-12-11 DIAGNOSIS — A088 Other specified intestinal infections: Secondary | ICD-10-CM

## 2011-12-11 NOTE — Patient Instructions (Signed)
I think you have had a viral gastroenteritis (GI virus) i'm glad you are improving - I do think that it will be a week or two- until you feel back to normal  Not uncommon to remain bloated and uncomfortable for a while -- avoid spicy and fatty food Yogurt for the probiotic is ok too  Update if not starting to improve in a week or if worsening

## 2011-12-11 NOTE — Assessment & Plan Note (Signed)
Now mostly resolved but with some residual bloating and early satiety  Advised bland diet and keeping up with fluids Probiotic or yogurt ok  Update if not starting to improve in a week or if worsening

## 2011-12-11 NOTE — Progress Notes (Signed)
Subjective:    Patient ID: Jennifer [name], female    DOB: [birth year], 56 y.o.   MRN: [id number]  HPI Here for abdominal pain and fullness  1 week ago Sunday  Ate a [employer] chicken for dinner -- then later that night had diarrhea - got better Then ate the chicken again- then worse with diarrhea and abdominal cramping (husband ate it and was fine)  Ended up going with a bland diet for the whole week  Continued to have grey colored stools -- no blood  3 days ago- started going regularly again = and color went to normal  No n/v or fever at all  Bloating and discomfort below umbilicus -- felt very full Now still feeling early satiety - even though she does not overeat    Is already on protonix - no problems with gerd lately or side effects   Patient Active Problem List  Diagnoses  . HYPOTHYROIDISM  . MIGRAINE WITH AURA  . GERD  . OSTEOPENIA  . Recurrent cold sores  . Gastroenteritis and colitis, viral   Past Medical History  Diagnosis Date  . Esophageal reflux   . Unspecified hypothyroidism   . Migraine with aura, without mention of intractable migraine without mention of status migrainosus   . Disorder of bone and cartilage, unspecified   . Allergic rhinitis    Past Surgical History  Procedure Date  . Rotator cuff repair 08/2008  . Dexa 5/10    osteopenia-stable on actonel  . Colonoscopy 2005    normal   History  Substance Use Topics  . Smoking status: Never Smoker   . Smokeless tobacco: Not on file  . Alcohol Use: Yes     6-8 ounces/week   Family History  Problem Relation Age of Onset  . Breast cancer Mother   . Anxiety disorder Mother     "Nervous stomach"  . Osteoporosis      grandmother   Allergies  Allergen Reactions  . Codeine Phosphate     REACTION: unspecified  . Hydrocodone     REACTION: made nervous--10/09 surgery   Current Outpatient Prescriptions on File Prior to Visit  Medication Sig Dispense Refill  . ALPRAZolam (XANAX) 0.5 MG tablet Take 0.5-1  tablets (0.25-0.5 mg total) by mouth daily as needed. For travel anxiety  15 tablet  0  . Ascorbic Acid (VITAMIN C) 500 MG tablet Take 500 mg by mouth daily.        Marland Kitchen atenolol (TENORMIN) 25 MG tablet Take 12.5 mg by mouth 2 (two) times daily.        . baclofen (LIORESAL) 10 MG tablet Take 10 mg by mouth as needed. For migraine prevention       . Calcium Carbonate-Vitamin D (CALCIUM 600+D) 600-400 MG-UNIT per tablet Take 1 tablet by mouth 2 (two) times daily.        Marland Kitchen desonide (DESOWEN) 0.05 % cream Apply to affected area 1 time daily as needed for eczema  60 g  1  . Evening Primrose Oil 1000 MG CAPS Take 1 capsule by mouth daily.        . fish oil-omega-3 fatty acids 1000 MG capsule Take 1 g by mouth daily.        Marland Kitchen ketoprofen (ORUDIS) 75 MG capsule Take 75 mg by mouth 4 (four) times daily as needed. With maxalt for migraines       . ketorolac (TORADOL) 10 MG tablet Take 10 mg by mouth 2 (two) times daily as needed. With  food for headache       . levothyroxine (SYNTHROID, LEVOTHROID) 50 MCG tablet Take 1 tablet (50 mcg total) by mouth daily.  30 tablet  3  . magnesium oxide (MAG-OX) 400 MG tablet Take 400 mg by mouth daily.        . mometasone (NASONEX) 50 MCG/ACT nasal spray 2 sprays by Nasal route daily as needed.        . norethindrone-ethinyl estradiol (FEMHRT 1/5) 1-5 MG-MCG TABS Take 1 tablet by mouth daily.        . pantoprazole (PROTONIX) 40 MG tablet Take 40 mg by mouth daily.        . promethazine (PHENERGAN) 25 MG tablet Take 25 mg by mouth every 6 (six) hours as needed.        . rizatriptan (MAXALT) 5 MG tablet Take 5 mg by mouth as needed. May repeat in 2 hours if needed            Review of Systems Review of Systems  Constitutional: Negative for fever, appetite change, fatigue and unexpected weight change.  Eyes: Negative for pain and visual disturbance.  Respiratory: Negative for cough and shortness of breath.   Cardiovascular: Negative for cp or palpitations      Gastrointestinal: Negative for nausea, constipation or lbood in stool or gerd symptoms   Genitourinary: Negative for urgency and frequency.  Skin: Negative for pallor or rash   Neurological: Negative for weakness, light-headedness, numbness and headaches.  Hematological: Negative for adenopathy. Does not bruise/bleed easily.  Psychiatric/Behavioral: Negative for dysphoric mood. The patient is not nervous/anxious.          Objective:   Physical Exam  Constitutional: She appears well-developed and well-nourished. No distress.  HENT:  Head: Normocephalic and atraumatic.  Mouth/Throat: Oropharynx is clear and moist.  Eyes: Conjunctivae and EOM are normal. Pupils are equal, round, and reactive to light. No scleral icterus.  Neck: Normal range of motion. Neck supple. No JVD present. No thyromegaly present.  Cardiovascular: Normal rate, regular rhythm and normal heart sounds.   Pulmonary/Chest: Effort normal and breath sounds normal. No respiratory distress. She has no wheezes.  Abdominal: Soft. Bowel sounds are normal. She exhibits no distension and no mass. There is no tenderness. There is no rebound and no guarding.  Lymphadenopathy:    She has no cervical adenopathy.  Neurological: She is alert. She has normal reflexes.  Skin: Skin is warm and dry. No rash noted. No erythema. No pallor.  Psychiatric: She has a normal mood and affect.          Assessment & Plan:

## 2012-01-01 ENCOUNTER — Encounter: Payer: Self-pay | Admitting: Family Medicine

## 2012-01-01 ENCOUNTER — Ambulatory Visit (INDEPENDENT_AMBULATORY_CARE_PROVIDER_SITE_OTHER): Payer: PRIVATE HEALTH INSURANCE | Admitting: Family Medicine

## 2012-01-01 VITALS — BP 112/80 | HR 68 | Temp 98.3°F | Wt 125.0 lb

## 2012-01-01 DIAGNOSIS — J019 Acute sinusitis, unspecified: Secondary | ICD-10-CM

## 2012-01-01 MED ORDER — HYDROCODONE-ACETAMINOPHEN 5-500 MG PO TABS: 1.0000 | ORAL_TABLET | Freq: Three times a day (TID) | ORAL | Status: AC | PRN

## 2012-01-01 MED ORDER — AMOXICILLIN-POT CLAVULANATE 875-125 MG PO TABS: 1.0000 | ORAL_TABLET | Freq: Two times a day (BID) | ORAL | Status: AC

## 2012-01-01 NOTE — Progress Notes (Signed)
Addended by: Eustaquio Boyden on: 01/01/2012 04:44 PM   Modules accepted: Orders

## 2012-01-01 NOTE — Patient Instructions (Signed)
You have a sinus infection. Take medicine as prescribed: augmentin twice daily for 10 days - complete course. Push fluids and plenty of rest. Nasal saline irrigation or neti pot to help drain sinuses. May use simple mucinex with plenty of fluid to help mobilize mucous. Let us know if fever >101.5, trouble opening/closing mouth, difficulty swallowing, or worsening - you may need to be seen again.

## 2012-01-01 NOTE — Assessment & Plan Note (Addendum)
Given duration and progression of symptoms, will treat with augmentin x 10 days. Update [place] if sxs not improving. Pt requests hydrocodone tablets for cough as takes 1/2 to 1/4 pill prn cough - tolerates in small doses.

## 2012-01-01 NOTE — Progress Notes (Signed)
  Subjective:    Patient ID: Jennifer [name], female    DOB: Oct 29, 1956, 56 y.o.   MRN: [id number]  HPI CC: laryngitis, cough  2wk h/o URTI, keeping at bay with nasal saline, nasonex, ricola, robitussin, etc.  Also using immediate release guaifenesin.  Feeling fatigued as well.  Having malaise throughout day.  Mild ST and cough.  3d h/o voice gone.  Mild myalgias.  Main concern is sinus pressure, congestion as this tends to worsen migraines.    No fevers/chills, HA, abd pain, n/v, arthralgias, new rashes.  H/o migraines in past.  Recently taken phenergan and ketoprofen and maxalt for migraine today.  Husband recently sick.  No smokers at home.  No h/o asthma, COPD.    Back in December had similar sxs which did resolve.  Got 95% better between then and now.    Review of Systems Per HPI    Objective:   Physical Exam  Nursing note and vitals reviewed. Constitutional: She appears well-developed and well-nourished. No distress.       Evidently congested  HENT:  Head: Normocephalic and atraumatic.  Right Ear: Hearing, tympanic membrane, external ear and ear canal normal.  Left Ear: Hearing, tympanic membrane, external ear and ear canal normal.  Nose: Mucosal edema present. Right sinus exhibits no maxillary sinus tenderness and no frontal sinus tenderness. Left sinus exhibits no maxillary sinus tenderness and no frontal sinus tenderness.  Mouth/Throat: Uvula is midline and mucous membranes are normal. Posterior oropharyngeal edema and posterior oropharyngeal erythema present. No oropharyngeal exudate or tonsillar abscesses.       Mild sinus pressure  Eyes: Conjunctivae and EOM are normal. Pupils are equal, round, and reactive to light. No scleral icterus.  Neck: Normal range of motion. Neck supple.  Cardiovascular: Normal rate, regular rhythm, normal heart sounds and intact distal pulses.   No murmur heard. Pulmonary/Chest: Effort normal and breath sounds normal. No respiratory distress. She  has no wheezes. She has no rales.  Lymphadenopathy:    She has no cervical adenopathy.  Skin: Skin is warm and dry. No rash noted.      Assessment & Plan:

## 2012-01-18 ENCOUNTER — Telehealth: Payer: Self-pay | Admitting: Family Medicine

## 2012-01-18 NOTE — Telephone Encounter (Signed)
Triage Record Num: [id number] Operator: Tarri Glenn Patient Name: Jennifer [name] Call Date & Time: 01/15/2012 8:34:14PM Patient Phone: [phone] PCP: Audrie Gallus. Tower Patient Gender: Female PCP Fax : Patient DOB: 02-Dec-1955 Practice Name: [facility] Reason for Call: Caller: [name]/Patient; PCP: Roxy Manns A.; CB#: [phone]; Call regarding Medication Issue; Medication(s): nasonex spray, completely out, now in [place]; Patient is calling about Nasonex refill and patient is now in [place]. [facility] @ [phone] who verified prescription originally written 01/04/2011 with 9 refills and patient only used 2 refills. Prescription for Nasonex 2 sprays nasally prn, no refills, [facility] into [facility] @ [phone] to Candace, Rph. Protocol(s) Used: Office Note Recommended Outcome per Protocol: Information Noted and Sent to Office Reason for Outcome: Caller information to office Care Advice: ~ 01/15/2012 8:56:31PM Page 1 of 1 [email or web address]

## 2012-01-21 ENCOUNTER — Other Ambulatory Visit: Payer: Self-pay | Admitting: *Deleted

## 2012-01-21 MED ORDER — FLUTICASONE PROPIONATE 50 MCG/ACT NA SUSP: 2.0000 | Freq: Every day | NASAL | Status: DC

## 2012-02-29 ENCOUNTER — Telehealth: Payer: Self-pay

## 2012-02-29 NOTE — Telephone Encounter (Signed)
Patient advised as instructed via telephone, she stated that it will be very hard for her to be seen before her flight Wednesday because [place].  She stated that she will just play it by ear and see how she feels, will keep f/u appt here on Thursday.

## 2012-02-29 NOTE — Telephone Encounter (Signed)
I cannot do that over the phone - also with her lung congestion that may not be the appropriate abx anyway... If feeling that bad, she should [facility] to [facility] where she [facility] before she flies

## 2012-02-29 NOTE — Telephone Encounter (Signed)
Pt is in [place] and has productive cough with dark yellow phlegm,weak,lungs are congested and crackles and pt hurts in chest when coughs also sinus pressure and congestion, fever. Pt has been using nasal saline irrigation,mucinex,flonase and pushing fluids but pt said feels worse as day goes on. Pt to fly back home wed and already has appt on Thur with Dr Milinda Antis but hates to fly like she is and does not want to go to [facility] in [place]. Pt said seen 01/01/12 and Amoxicilliln helped. Explained usually does not call in antibiotic but pt wants to see if Dr Milinda Antis will call in. Pt wants Amoxicillin called to [facility] in [place] [phone]. Pt can be reached at [phone].

## 2012-03-01 ENCOUNTER — Telehealth: Payer: Self-pay | Admitting: Family Medicine

## 2012-03-01 NOTE — Telephone Encounter (Signed)
Triage Record Num: [id number] Operator: Craig Guess Patient Name: Jennifer [name] Call Date & Time: 02/29/2012 4:32:59PM Patient Phone: [phone] PCP: Audrie Gallus. Tower Patient Gender: Female PCP Fax : Patient DOB: Dec 16, 1955 Practice Name: [facility] Reason for Call: Caller: [name]/Patient; PCP: Roxy Manns A.; CB#: [phone]. Call regarding Upper Respiratory Concerns. Upon callback, caller reports she has spoken with staff in the office and note was sent for MD re antibxs. RN reviewed EPIC record, there is a note for callback from MD in her chart taken by [facility] in office. Caller advised she will get a callback from MD. Protocol(s) Used: Office Note Recommended Outcome per Protocol: Information Noted and Sent to Office Reason for Outcome: Caller information to office Care Advice: ~ 02/29/2012 4:37:33PM Page 1 of 1 [email or web address]

## 2012-03-01 NOTE — Telephone Encounter (Signed)
This was addressed

## 2012-03-03 ENCOUNTER — Ambulatory Visit (INDEPENDENT_AMBULATORY_CARE_PROVIDER_SITE_OTHER): Payer: PRIVATE HEALTH INSURANCE | Admitting: Family Medicine

## 2012-03-03 ENCOUNTER — Ambulatory Visit: Payer: PRIVATE HEALTH INSURANCE | Admitting: Family Medicine

## 2012-03-03 ENCOUNTER — Encounter: Payer: Self-pay | Admitting: Family Medicine

## 2012-03-03 VITALS — BP 110/78 | HR 58 | Temp 98.4°F | Wt 125.8 lb

## 2012-03-03 DIAGNOSIS — J069 Acute upper respiratory infection, unspecified: Secondary | ICD-10-CM | POA: Insufficient documentation

## 2012-03-03 MED ORDER — AMOXICILLIN 500 MG PO CAPS: 500.0000 mg | ORAL_CAPSULE | Freq: Two times a day (BID) | ORAL | Status: AC

## 2012-03-03 NOTE — Patient Instructions (Addendum)
You have an upper respiratory infection, likely viral Take amoxicillin if not improving as expected. Push fluids and plenty of rest. Nasal saline irrigation or neti pot to help drain sinuses. May use simple mucinex with plenty of fluid to help mobilize mucous. Let us know if fever >101.5, trouble opening/closing mouth, difficulty swallowing, or worsening - you may need to be seen again.

## 2012-03-03 NOTE — Assessment & Plan Note (Signed)
Anticipate viral - as improving, continue supportive care. WASP script for amoxicillin provided today (10d course) Red flags to fill abx discussed, if worsening despite abx to return to see [place].

## 2012-03-03 NOTE — Progress Notes (Signed)
Addended by: Gilmer Mor on: 03/03/2012 09:32 AM   Modules accepted: Orders

## 2012-03-03 NOTE — Progress Notes (Signed)
  Subjective:    Patient ID: Jennifer [name], female    DOB: [birth year], 56 y.o.   MRN: [id number]  HPI CC: fever, nasal congestion, cough  1 + wk h/o mild cough then 5d ago started having worsening cough.  Then on Monday felt acutely worse - malaise, body aches, weak, tired, sinus congestion, productive cough of white mucous.  Clear sinus drainage.  Tmax 99.2.  Some skin weakness.  Increase in migraines using ketoprofen and maxalt.  Has appt to see neurologist today at 1pm for trigger point injections.  Mild nausea.  Some muffled hearing.  + PNDrainage.  Using flonase, claritin, nasal saline throughout the day, guaifenesin 400mg  Q4-5 hours.  No abd pain, vomiting, diarrhea/constipation.  No ear or tooth pain.    No sick contacts at home.  Recent trip back from [place] (on plane yesterday).  No smokers at home. No h/o asthma, COPD.   Review of Systems Per HPI    Objective:   Physical Exam  Nursing note and vitals reviewed. Constitutional: She appears well-developed and well-nourished. No distress.  HENT:  Head: Normocephalic and atraumatic.  Right Ear: Hearing, tympanic membrane, external ear and ear canal normal.  Left Ear: Hearing, tympanic membrane, external ear and ear canal normal.  Nose: No mucosal edema or rhinorrhea. Right sinus exhibits maxillary sinus tenderness and frontal sinus tenderness. Left sinus exhibits maxillary sinus tenderness and frontal sinus tenderness.  Mouth/Throat: Uvula is midline and mucous membranes are normal. Posterior oropharyngeal erythema present. No oropharyngeal exudate, posterior oropharyngeal edema or tonsillar abscesses.  Eyes: Conjunctivae and EOM are normal. Pupils are equal, round, and reactive to light. No scleral icterus.  Neck: Normal range of motion. Neck supple.  Cardiovascular: Normal rate, regular rhythm, normal heart sounds and intact distal pulses.   No murmur heard. Pulmonary/Chest: Effort normal and breath sounds normal. No respiratory  distress. She has no wheezes. She has no rales.  Lymphadenopathy:    She has no cervical adenopathy.  Skin: Skin is warm and dry. No rash noted.       Assessment & Plan:

## 2012-03-08 ENCOUNTER — Other Ambulatory Visit: Payer: Self-pay | Admitting: Internal Medicine

## 2012-04-29 ENCOUNTER — Other Ambulatory Visit: Payer: Self-pay | Admitting: Gynecology

## 2012-04-29 DIAGNOSIS — Z1231 Encounter for screening mammogram for malignant neoplasm of breast: Secondary | ICD-10-CM

## 2012-05-05 ENCOUNTER — Other Ambulatory Visit: Payer: Self-pay

## 2012-05-05 MED ORDER — ALPRAZOLAM 0.5 MG PO TABS: 0.2500 mg | ORAL_TABLET | ORAL | Status: DC | PRN

## 2012-05-05 NOTE — Telephone Encounter (Signed)
Px written for call in   

## 2012-05-05 NOTE — Telephone Encounter (Signed)
Medication phoned to pharmacy.  

## 2012-05-05 NOTE — Telephone Encounter (Signed)
Pt request refill alprazolam 0.5 mg to [facility]. Pt said does not need until 05/10/12.

## 2012-05-10 ENCOUNTER — Ambulatory Visit: Payer: PRIVATE HEALTH INSURANCE | Admitting: Gynecology

## 2012-05-10 ENCOUNTER — Other Ambulatory Visit: Payer: Self-pay

## 2012-05-10 MED ORDER — PANTOPRAZOLE SODIUM 40 MG PO TBEC: 40.0000 mg | DELAYED_RELEASE_TABLET | Freq: Every day | ORAL | Status: DC

## 2012-05-10 NOTE — Telephone Encounter (Signed)
Will refill electronically  

## 2012-05-10 NOTE — Telephone Encounter (Signed)
Done

## 2012-05-10 NOTE — Telephone Encounter (Signed)
Pt request refill Pantoprazole sent to [facility]. Pt said her Pantoprazole 40 mg  prescription says one tab twice a day. Pt said usually takes once daily unless a lot of problem with GERD. Pts med list has once daily. Pt going out of town 05/11/12 and request sent to [facility] today.Please advise.

## 2012-05-17 ENCOUNTER — Ambulatory Visit: Payer: PRIVATE HEALTH INSURANCE

## 2012-05-20 ENCOUNTER — Ambulatory Visit
Admission: RE | Admit: 2012-05-20 | Discharge: 2012-05-20 | Disposition: A | Discharge status: Home / Self Care | Payer: PRIVATE HEALTH INSURANCE | Source: Ambulatory Visit | Attending: Gynecology | Admitting: Gynecology

## 2012-05-20 DIAGNOSIS — Z1231 Encounter for screening mammogram for malignant neoplasm of breast: Secondary | ICD-10-CM

## 2012-06-02 ENCOUNTER — Ambulatory Visit: Payer: PRIVATE HEALTH INSURANCE | Admitting: Gynecology

## 2012-06-08 ENCOUNTER — Encounter: Payer: Self-pay | Admitting: Gynecology

## 2012-06-08 ENCOUNTER — Ambulatory Visit (INDEPENDENT_AMBULATORY_CARE_PROVIDER_SITE_OTHER): Payer: PRIVATE HEALTH INSURANCE | Admitting: Gynecology

## 2012-06-08 VITALS — BP 120/78 | Ht 61.0 in | Wt 123.0 lb

## 2012-06-08 DIAGNOSIS — Z01419 Encounter for gynecological examination (general) (routine) without abnormal findings: Secondary | ICD-10-CM

## 2012-06-08 DIAGNOSIS — M899 Disorder of bone, unspecified: Secondary | ICD-10-CM

## 2012-06-08 DIAGNOSIS — Z79890 Hormone replacement therapy (postmenopausal): Secondary | ICD-10-CM

## 2012-06-08 DIAGNOSIS — M8580 Other specified disorders of bone density and structure, unspecified site: Secondary | ICD-10-CM

## 2012-06-08 MED ORDER — NORETHINDRONE-ETH ESTRADIOL 1-5 MG-MCG PO TABS: 1.0000 | ORAL_TABLET | Freq: Every day | ORAL | Status: DC

## 2012-06-08 NOTE — Patient Instructions (Signed)
Follow up for bone density as scheduled. Follow up for annual gynecologic exam in one year.

## 2012-06-08 NOTE — Progress Notes (Signed)
Jennifer [name] 02-Aug-1956 [id number]        56 y.o.  G0P0 new patient for annual exam.  Several issues noted below.  Past medical history,surgical history, medications, allergies, family history and social history were all reviewed and documented in the EPIC chart. ROS:  Was performed and pertinent positives and negatives are included in the history.  Exam: Kim assistant Filed Vitals:   06/08/12 1132  BP: 120/78  Height: 5\' 1"  (1.549 m)  Weight: 123 lb (55.792 kg)   General appearance  Normal Skin grossly normal Head/Neck normal with no cervical or supraclavicular adenopathy thyroid normal Lungs  clear Cardiac RR, without RMG Abdominal  soft, nontender, without masses, organomegaly or hernia Breasts  examined lying and sitting without masses, retractions, discharge or axillary adenopathy. Pelvic  Ext/BUS/vagina  normal   Cervix  normal   Uterus  retroverted, normal size, shape and contour, midline and mobile nontender   Adnexa  Without masses or tenderness    Anus and perineum  normal   Rectovaginal  normal sphincter tone without palpated masses or tenderness.    Assessment/Plan:  56 y.o. G0P0 female for annual exam.   1. HRT. Patient is on [facility] for 5 years started for menopausal symptoms and osteopenia. She's tried weaning with return of hot flashes. I reviewed the WHI study with increased risk of stroke heart attack DVT and breast cancer risk. ACOG/NAMS recommendations for lowest dose shortest period of time. Transdermal versus oral first pass effect benefits reviewed. After lengthy discussion patient wants to continue this coming year and I refilled her times a year. Options for switching to transdermal review but I think that she is doing well with the oral will continue her on this and she accepts this.  She did have an episode of bleeding last year that Dr. Jackelyn Knife evaluate her for to include ultrasound with no further treatment needed. She knows to report any bleeding to  me. 2. Osteopenia. History of Actonel use in the past for approximately 5 years and subsequently discontinued. She is 3 years out from her last DEXA and I ordered a DEXA today. Increase calcium vitamin D reviewed. 3. Mammography. Patient had mammogram June 2013. We'll continue with annual mammography. SBE monthly reviewed. 4. Pap smear. Patient does have a history of severe dysplasia status post cone biopsy 1985 with normal annual Pap smears since then. No Pap smear done today with last Pap smear 2012. We'll plan every 3-5 your screening. 5. Colonoscopy. Patient had a colonoscopy 6 years ago. I gave her a home stool guaiac check kit to take home she does develop back call us to make sure we received it in its negative. She'll plan repeat colonoscopy in a 10 year interval. 6. Health maintenance. No blood work was done today as is all done through Dr. Lucretia Roers office who she sees routinely.    Dara Lords MD, 12:47 PM 06/08/2012

## 2012-06-09 ENCOUNTER — Ambulatory Visit: Payer: PRIVATE HEALTH INSURANCE | Admitting: Gynecology

## 2012-06-16 ENCOUNTER — Other Ambulatory Visit: Payer: Self-pay | Admitting: Family Medicine

## 2012-06-16 NOTE — Telephone Encounter (Signed)
Ok to refill? Last OV was 03/03/12

## 2012-06-16 NOTE — Telephone Encounter (Signed)
Will refill electronically  

## 2012-06-22 ENCOUNTER — Telehealth: Payer: Self-pay | Admitting: Gynecology

## 2012-06-22 DIAGNOSIS — D259 Leiomyoma of uterus, unspecified: Secondary | ICD-10-CM

## 2012-06-22 DIAGNOSIS — N83209 Unspecified ovarian cyst, unspecified side: Secondary | ICD-10-CM

## 2012-06-22 NOTE — Telephone Encounter (Signed)
Tell patient that I received records from Dr. Eligha Bridegroom office where she had an ultrasound August 2012 which showed a 32 mm cyst on her right ovary and multiple small leiomyoma.  I think it would be prudent to repeat the ultrasound now at a one-year interval just to make sure everything looks stable and relook at that cyst. Have her schedule an ultrasound. Order placed

## 2012-06-22 NOTE — Telephone Encounter (Signed)
Pt informed with the below note, pt will check insurance company to check if they will pay. She will call back to scheduled.

## 2012-06-23 ENCOUNTER — Telehealth: Payer: Self-pay | Admitting: *Deleted

## 2012-06-23 NOTE — Telephone Encounter (Signed)
Please see the below. 

## 2012-06-23 NOTE — Telephone Encounter (Signed)
Message copied by Aura Camps on Thu Jun 23, 2012  3:38 PM ------      Message from: Carole Civil R      Created: Thu Jun 23, 2012 10:53 AM       Redgie Grayer: This patient needs to schedule a bone density and when she checked with her insurance they require a predetermination letter to be sent prior to approval of the test.      The patient faxed over today the results of her most recent test for him to see. Would you please ask him if he will do that for her?            If he needs the insurance address: [facility]                                                                [address]                                                                [place], [place] [address]      Thank you. Toniann Fail

## 2012-06-29 ENCOUNTER — Ambulatory Visit (INDEPENDENT_AMBULATORY_CARE_PROVIDER_SITE_OTHER): Payer: PRIVATE HEALTH INSURANCE | Admitting: Gynecology

## 2012-06-29 ENCOUNTER — Ambulatory Visit (INDEPENDENT_AMBULATORY_CARE_PROVIDER_SITE_OTHER): Payer: PRIVATE HEALTH INSURANCE

## 2012-06-29 ENCOUNTER — Encounter: Payer: Self-pay | Admitting: Gynecology

## 2012-06-29 DIAGNOSIS — M8580 Other specified disorders of bone density and structure, unspecified site: Secondary | ICD-10-CM

## 2012-06-29 DIAGNOSIS — N949 Unspecified condition associated with female genital organs and menstrual cycle: Secondary | ICD-10-CM

## 2012-06-29 DIAGNOSIS — N83209 Unspecified ovarian cyst, unspecified side: Secondary | ICD-10-CM

## 2012-06-29 DIAGNOSIS — M899 Disorder of bone, unspecified: Secondary | ICD-10-CM

## 2012-06-29 DIAGNOSIS — R102 Pelvic and perineal pain: Secondary | ICD-10-CM

## 2012-06-29 DIAGNOSIS — D251 Intramural leiomyoma of uterus: Secondary | ICD-10-CM

## 2012-06-29 DIAGNOSIS — D259 Leiomyoma of uterus, unspecified: Secondary | ICD-10-CM

## 2012-06-29 NOTE — Telephone Encounter (Signed)
WILL FORWARD MESSAGE TO WENDY.

## 2012-06-29 NOTE — Patient Instructions (Signed)
Office will contact you to arrange bone density. Follow up for repeat ultrasound in 3 months.

## 2012-06-29 NOTE — Progress Notes (Addendum)
Patient presents for ultrasound with history of ultrasound at Dr. Eligha Bridegroom office a year ago showing multiple small myomas and a 32 mm cyst on her right ovary.  Ultrasound today shows endometrial echo 3.1 mm. Multiple small myomas largest measuring 28 mm. Right ovary with thin wall echo-free cyst 32 mm mean negative color flow. Left ovary normal. Cul-de-sac negative.  Assessment and plan: 1. Persistent right simple avascular cyst. Recommend repeat study in 3 months just for stability at the same facility. I reviewed with her the differential to include persistent functional, non-malignant neoplastic and cancer. Given the stability over time highly unlikely carcinoma. Issues of CA 125 screening also reviewed to include false-positive false-negative issues. Patient's comfortable with not screening with CA 125 but repeating the ultrasound 3 months. If stable at that point I think we can stop altogether or we look at it at an annual interval. 2. Received DEXA from other facility May 2010 showed normal spine. Left neck -2.0 noting in 2008 it was -2.1. Right neck -1.7 noting in 2008 it was -1.9. Left forearm normal. Patient had been on Actonel previously but had stopped it since then and has been off of it for the last 3 or so years. We'll repeat DEXA now and then go from there.

## 2012-06-29 NOTE — Telephone Encounter (Signed)
Please forward the below note for predetermination for DEXA and schedule the patient for a DEXA.  To Whom It May Concern,  It is recommended that [facility] undergo DEXA bone density in follow up of her 2010 DEXA which demonstrated osteopenia with a -2.0 T score. This would be a 3 year follow up study for assessment for treatment need given the significant prior degree of osteopenia. Patient is Caucasian, postmenopausal, hypothyroid and considered at an increased risk for fracture.  Sincerely,

## 2012-09-12 ENCOUNTER — Other Ambulatory Visit: Payer: Self-pay | Admitting: Family Medicine

## 2012-09-12 NOTE — Telephone Encounter (Signed)
Please refil for 6 months, thanks 

## 2012-09-12 NOTE — Telephone Encounter (Signed)
Ok to refill? No recent labs 

## 2012-09-28 ENCOUNTER — Other Ambulatory Visit: Payer: PRIVATE HEALTH INSURANCE

## 2012-09-28 ENCOUNTER — Ambulatory Visit: Payer: PRIVATE HEALTH INSURANCE | Admitting: Gynecology

## 2012-10-03 ENCOUNTER — Other Ambulatory Visit: Payer: Self-pay | Admitting: *Deleted

## 2012-10-03 MED ORDER — LEVOTHYROXINE SODIUM 50 MCG PO TABS: ORAL_TABLET | ORAL | Status: DC

## 2012-10-26 ENCOUNTER — Encounter: Payer: Self-pay | Admitting: Gynecology

## 2012-10-26 ENCOUNTER — Ambulatory Visit (INDEPENDENT_AMBULATORY_CARE_PROVIDER_SITE_OTHER): Payer: PRIVATE HEALTH INSURANCE

## 2012-10-26 ENCOUNTER — Ambulatory Visit (INDEPENDENT_AMBULATORY_CARE_PROVIDER_SITE_OTHER): Payer: PRIVATE HEALTH INSURANCE | Admitting: Gynecology

## 2012-10-26 DIAGNOSIS — N83209 Unspecified ovarian cyst, unspecified side: Secondary | ICD-10-CM

## 2012-10-26 DIAGNOSIS — N949 Unspecified condition associated with female genital organs and menstrual cycle: Secondary | ICD-10-CM

## 2012-10-26 DIAGNOSIS — D251 Intramural leiomyoma of uterus: Secondary | ICD-10-CM

## 2012-10-26 DIAGNOSIS — D259 Leiomyoma of uterus, unspecified: Secondary | ICD-10-CM

## 2012-10-26 DIAGNOSIS — R102 Pelvic and perineal pain: Secondary | ICD-10-CM

## 2012-10-26 NOTE — Progress Notes (Signed)
Patient presents for follow up ultrasound. History of ultrasound showing multiple small myomas and an echo free avascular right ovarian cyst that originally was done over a year ago with Dr. Eligha Bridegroom office for bleeding measured at 32 mm.  She did alert me today that she has a history of endometriosis diagnosed during an infertility workup a number of years ago and from what she describes a soft superficial small implants.  Ultrasound today shows small myomas with persistence of the right ovarian thin-walled echo free cyst avascular with 30 mm mean. Left ovary atrophic.  Assessment and plan: Persistent unchanged right ovarian thin-walled echo free avascular cyst at 30 mm. Does have a history of endometriosis although this does not appear to be a classic endometrioma that possibility was discussed with her. Options for management including expectant management versus interventional with laparoscopy and removal discussed. She's not having significant pain or other symptoms and would prefer observation which I think is reasonable.  We have discussed the disclaimer that I cannot guarantee as to cancer and she understands and accepts this. She will follow up in July when she is due for her annual. She never followed up with her DEXA as recommended. She has a history of osteopenia had been on Actonel although has been off of this for several years and her last DEXA was 2010. I again encouraged her to schedule this and she agrees to arrange.

## 2012-10-26 NOTE — Patient Instructions (Signed)
Schedule bone density study as we discussed. Follow up in July for your annual exam. Sooner if any issues.

## 2012-10-28 ENCOUNTER — Ambulatory Visit (INDEPENDENT_AMBULATORY_CARE_PROVIDER_SITE_OTHER): Payer: PRIVATE HEALTH INSURANCE | Admitting: Family Medicine

## 2012-10-28 ENCOUNTER — Encounter: Payer: Self-pay | Admitting: Family Medicine

## 2012-10-28 ENCOUNTER — Other Ambulatory Visit: Payer: Self-pay | Admitting: *Deleted

## 2012-10-28 VITALS — BP 102/58 | HR 57 | Temp 98.4°F | Ht 60.5 in | Wt 119.0 lb

## 2012-10-28 DIAGNOSIS — E039 Hypothyroidism, unspecified: Secondary | ICD-10-CM

## 2012-10-28 DIAGNOSIS — Z23 Encounter for immunization: Secondary | ICD-10-CM

## 2012-10-28 DIAGNOSIS — Z0000 Encounter for general adult medical examination without abnormal findings: Secondary | ICD-10-CM | POA: Insufficient documentation

## 2012-10-28 DIAGNOSIS — M949 Disorder of cartilage, unspecified: Secondary | ICD-10-CM

## 2012-10-28 DIAGNOSIS — M899 Disorder of bone, unspecified: Secondary | ICD-10-CM

## 2012-10-28 LAB — TSH: TSH: 1.17 u[IU]/mL (ref 0.35–5.50)

## 2012-10-28 LAB — CBC WITH DIFFERENTIAL/PLATELET
Basophils Relative: 0.6 % (ref 0.0–3.0)
Eosinophils Absolute: 0.1 10*3/uL (ref 0.0–0.7)
HCT: 39.9 % (ref 36.0–46.0)
Lymphs Abs: 1.5 10*3/uL (ref 0.7–4.0)
MCHC: 33.1 g/dL (ref 30.0–36.0)
MCV: 93.7 fl (ref 78.0–100.0)
Monocytes Absolute: 0.5 10*3/uL (ref 0.1–1.0)
Neutro Abs: 3.1 10*3/uL (ref 1.4–7.7)
Neutrophils Relative %: 60.4 % (ref 43.0–77.0)
RBC: 4.26 Mil/uL (ref 3.87–5.11)

## 2012-10-28 LAB — LIPID PANEL
Cholesterol: 188 mg/dL (ref 0–200)
HDL: 64 mg/dL (ref >39.00)
LDL Cholesterol: 108 mg/dL — ABNORMAL HIGH (ref 0–99)
Triglycerides: 82 mg/dL (ref 0.0–149.0)
VLDL: 16.4 mg/dL (ref 0.0–40.0)

## 2012-10-28 LAB — COMPREHENSIVE METABOLIC PANEL
AST: 19 U/L (ref 0–37)
Alkaline Phosphatase: 61 U/L (ref 39–117)
BUN: 13 mg/dL (ref 6–23)
Creatinine, Ser: 0.7 mg/dL (ref 0.4–1.2)
Glucose, Bld: 97 mg/dL (ref 70–99)
Potassium: 4.3 mEq/L (ref 3.5–5.1)
Total Bilirubin: 0.9 mg/dL (ref 0.3–1.2)

## 2012-10-28 MED ORDER — ACYCLOVIR 5 % EX CREA: 1.0000 "application " | TOPICAL_CREAM | CUTANEOUS | Status: DC | PRN

## 2012-10-28 MED ORDER — ALPRAZOLAM 0.5 MG PO TABS: 0.2500 mg | ORAL_TABLET | ORAL | Status: DC | PRN

## 2012-10-28 MED ORDER — DESONIDE 0.05 % EX CREA: TOPICAL_CREAM | CUTANEOUS | Status: AC

## 2012-10-28 NOTE — Patient Instructions (Addendum)
Tdap vaccine today  Wellness labs today  Take care of yourself - diet and exercise

## 2012-10-28 NOTE — Progress Notes (Signed)
Subjective:    Patient ID: Jennifer [name], female    DOB: 03/12/1956, 56 y.o.   MRN: [id number]  HPI Here for health maintenance exam and to review chronic medical problems    Is doing fairly well  A crazy/ busy year -lots of stress with work  Family issues- step daughter is drug addict and going through some rehab- that has been a lot of work  With stress her migraines are a little worse - she expected that   Has a new rash - breaking out on her arms -this is better than it was  Very sensitive skin  Face and neck Uses desonide cream from Dr Luan Moore  Seems to react to something in donuts and also apple pie - is strange Could also be reacting to eve primrose oil Was also traveling to [facility] is down 4 lb with bmi 22  May be stress related   Just had her pap at gyn in sept  Did transvaginal US for ovarian cyst - and just had follow up - cyst is stable   Tdap- needs that   Flu vaccine -- had that in october  colonosc 2/05 - 10 year recall   mammo 6/13 Normal  No lumps on self exam   Osteopenia - dexa is planned (gyn follows)-- has it upcoming  Is taking her ca and D - needs to be more regular     Hypothyroid Hypothyroidism  Pt has no clinical changes No change in energy level/ hair or skin/ edema and no tremor Lab Results  Component Value Date   TSH 0.630 11/06/2011     Labs - is due for them   Patient Active Problem List  Diagnosis  . HYPOTHYROIDISM  . MIGRAINE WITH AURA  . GERD  . OSTEOPENIA  . Recurrent cold sores  . Gastroenteritis and colitis, viral   Past Medical History  Diagnosis Date  . Esophageal reflux   . Unspecified hypothyroidism   . Migraine with aura, without mention of intractable migraine without mention of status migrainosus   . Allergic rhinitis   . Osteopenia   . Endometriosis    Past Surgical History  Procedure Date  . Rotator cuff repair 08/2008  . Dexa 5/10    osteopenia-stable on actonel  . Colonoscopy 2005   normal  . Cervical cone biopsy 1985    severe dysplasia  . Breast surgery     Benign breast lump excised   History  Substance Use Topics  . Smoking status: Never Smoker   . Smokeless tobacco: Not on file  . Alcohol Use: No   Family History  Problem Relation Age of Onset  . Breast cancer Mother   . Diabetes Mother   . Hypertension Father   . Heart disease Father   . Osteoporosis Maternal Grandmother   . Diabetes Maternal Grandmother    Allergies  Allergen Reactions  . Codeine Phosphate Other (See Comments)    Dizziness, heart palpitations, nervous  . Hydrocodone     REACTION: made nervous--10/09 surgery rotator cuff   Current Outpatient Prescriptions on File Prior to Visit  Medication Sig Dispense Refill  . ALPRAZolam (XANAX) 0.5 MG tablet Take 0.5-1 tablets (0.25-0.5 mg total) by mouth as needed for anxiety. For travel anxiety  15 tablet  0  . Ascorbic Acid (VITAMIN C) 500 MG tablet Take 500 mg by mouth daily.        Marland Kitchen atenolol (TENORMIN) 25 MG tablet Take 12.5 mg by  mouth 3 (three) times daily.       . baclofen (LIORESAL) 10 MG tablet Take 10 mg by mouth as needed. For migraine prevention       . Calcium Carbonate-Vitamin D (CALCIUM 600+D) 600-400 MG-UNIT per tablet Take 1 tablet by mouth 2 (two) times daily.        Marland Kitchen desonide (DESOWEN) 0.05 % cream Apply to affected area 1 time daily as needed for eczema  60 g  1  . Evening Primrose Oil 1000 MG CAPS Take 1 capsule by mouth daily.        . fish oil-omega-3 fatty acids 1000 MG capsule Take 1 g by mouth daily.        . fluticasone (FLONASE) 50 MCG/ACT nasal spray USE 2 SPRAYS IN EACH NOSTRIL ONCE A DAY  16 g  5  . ketoprofen (ORUDIS) 75 MG capsule Take 75 mg by mouth as needed. With maxalt for migraines      . ketorolac (TORADOL) 10 MG tablet Take 10 mg by mouth as needed. With food for headache      . levothyroxine (SYNTHROID, LEVOTHROID) 50 MCG tablet TAKE 1 TABLET (50 MCG TOTAL) BY MOUTH DAILY. * Needs appointment with Dr  for any additional refills*  30 tablet  0  . loratadine (CLARITIN) 10 MG tablet Take 10 mg by mouth daily.      . magnesium oxide (MAG-OX) 400 MG tablet Take 400 mg by mouth daily.        . norethindrone-ethinyl estradiol (FEMHRT 1/5) 1-5 MG-MCG TABS Take 1 tablet by mouth daily.  28 tablet  11  . pantoprazole (PROTONIX) 40 MG tablet Take 1 tablet (40 mg total) by mouth daily. For GERD  30 tablet  11  . promethazine (PHENERGAN) 25 MG tablet Take 25 mg by mouth as needed. When having a migraine      . rizatriptan (MAXALT) 10 MG tablet Take 10 mg by mouth as needed. May repeat in 2 hours if needed      . valACYclovir (VALTREX) 500 MG tablet TAKE 1 TABLET BY MOUTH EVERY DAY  30 tablet  1      Review of Systems Review of Systems  Constitutional: Negative for fever, appetite change, fatigue and unexpected weight change.  Eyes: Negative for pain and visual disturbance.  Respiratory: Negative for cough and shortness of breath.   Cardiovascular: Negative for cp or palpitations    Gastrointestinal: Negative for nausea, diarrhea and constipation.  Genitourinary: Negative for urgency and frequency.  Skin: Negative for pallor and pos for intermittent rash Neurological: Negative for weakness, light-headedness, numbness and headaches.  Hematological: Negative for adenopathy. Does not bruise/bleed easily.  Psychiatric/Behavioral: Negative for dysphoric mood. The patient is not nervous/anxious.         Objective:   Physical Exam  Constitutional: She appears well-developed and well-nourished. No distress.  HENT:  Head: Normocephalic and atraumatic.  Right Ear: External ear normal.  Left Ear: External ear normal.  Nose: Nose normal.  Mouth/Throat: Oropharynx is clear and moist.  Eyes: Conjunctivae normal and EOM are normal. Pupils are equal, round, and reactive to light. Right eye exhibits no discharge. Left eye exhibits no discharge. No scleral icterus.  Neck: Normal range of motion. Neck supple.  No JVD present. Carotid bruit is not present. No thyromegaly present.  Cardiovascular: Normal rate, normal heart sounds and intact distal pulses.  Exam reveals no gallop.   Pulmonary/Chest: Effort normal and breath sounds normal. No respiratory distress. She has  no wheezes.  Abdominal: Soft. Bowel sounds are normal. She exhibits no distension, no abdominal bruit and no mass. There is no tenderness.  Musculoskeletal: She exhibits no edema and no tenderness.  Lymphadenopathy:    She has no cervical adenopathy.  Neurological: She is alert. She has normal reflexes. No cranial nerve deficit. She exhibits normal muscle tone. Coordination normal.  Skin: Skin is warm and dry. No rash noted. No erythema. No pallor.       Fair complexion  No rash today  Psychiatric: She has a normal mood and affect.          Assessment & Plan:

## 2012-10-30 NOTE — Assessment & Plan Note (Signed)
Reviewed health habits including diet and exercise and skin cancer prevention Also reviewed health mt list, fam hx and immunizations  Wellness labs today Tdap vaccine

## 2012-10-30 NOTE — Assessment & Plan Note (Signed)
Gyn follows dexa planned  Ca and D rev Check D level No fragility fractures

## 2012-10-30 NOTE — Assessment & Plan Note (Signed)
Lab today  No symptoms  

## 2012-10-31 ENCOUNTER — Encounter: Payer: Self-pay | Admitting: *Deleted

## 2012-11-04 ENCOUNTER — Other Ambulatory Visit: Payer: Self-pay

## 2012-11-04 NOTE — Telephone Encounter (Signed)
Pt left v/m picked up zovirax cream for 5 gm instead of 15 gm. Pharmacy will not take prescription back and pt paid $35.oo copay. Pt request call back.

## 2012-11-04 NOTE — Telephone Encounter (Signed)
Called pharm. and they advise me that they don't make a 15 gm tube anymore it only comes in 5 gm, advise pt and she is ok with the 5mg  tube

## 2012-11-08 ENCOUNTER — Other Ambulatory Visit: Payer: Self-pay | Admitting: *Deleted

## 2012-11-08 MED ORDER — LEVOTHYROXINE SODIUM 50 MCG PO TABS: ORAL_TABLET | ORAL | Status: DC

## 2012-11-22 ENCOUNTER — Ambulatory Visit (INDEPENDENT_AMBULATORY_CARE_PROVIDER_SITE_OTHER): Payer: PRIVATE HEALTH INSURANCE

## 2012-11-22 DIAGNOSIS — M8580 Other specified disorders of bone density and structure, unspecified site: Secondary | ICD-10-CM

## 2012-11-22 DIAGNOSIS — M899 Disorder of bone, unspecified: Secondary | ICD-10-CM

## 2012-11-24 ENCOUNTER — Encounter: Payer: Self-pay | Admitting: Gynecology

## 2012-11-24 ENCOUNTER — Telehealth: Payer: Self-pay | Admitting: Gynecology

## 2012-11-24 NOTE — Telephone Encounter (Signed)
Tell patient that her eczema does show osteopenia. I do not have a comparison study but her FRAX calculation does not indicate need to treat at this time. She had been on Actonel in the past and my recommendation would be to maximize calcium that 1200 mg total dietary, vitamin D 1000 units daily and recheck DEXA in 2 years.

## 2012-11-24 NOTE — Telephone Encounter (Signed)
Patient informed. 

## 2012-11-24 NOTE — Telephone Encounter (Signed)
Left message patient to call.  

## 2012-12-14 ENCOUNTER — Other Ambulatory Visit: Payer: Self-pay | Admitting: Family Medicine

## 2012-12-14 ENCOUNTER — Other Ambulatory Visit: Payer: Self-pay | Admitting: *Deleted

## 2012-12-14 MED ORDER — VALACYCLOVIR HCL 500 MG PO TABS: 500.0000 mg | ORAL_TABLET | Freq: Every day | ORAL | Status: DC

## 2012-12-14 MED ORDER — NORETHINDRONE-ETH ESTRADIOL 1-5 MG-MCG PO TABS: 1.0000 | ORAL_TABLET | Freq: Every day | ORAL | Status: DC

## 2012-12-14 NOTE — Telephone Encounter (Signed)
Pt switched pharmacies but pt was out of refills from previous pharmacy.  Last OV 10/28/12.  RX sent to [facility].

## 2013-01-24 ENCOUNTER — Telehealth: Payer: Self-pay | Admitting: Family Medicine

## 2013-01-24 NOTE — Telephone Encounter (Signed)
Pt said that her insurance didn't pay for her vitamin D level because the way it was coded, it was coded as part of routine labs for a CPE, pt wanted to know if we could resend that lab charge to her insurance with the code for her osteopenia (bone loss) then she said that they would pay for it, please advise

## 2013-01-24 NOTE — Telephone Encounter (Signed)
Pt called and was very concerned that a Vitamin D Lab was completed at her 10/28/12 office visit.  She stated that she is usually informed when labs are being ordered and because of the type of insurance she had in 2013, she would have requested less labs to be completed.  I told Jennifer [name] that I would start the process by just informing you of her concerns regarding labs.  Jennifer [name] has charged her $40.70 and that is when she became aware of the Vitamin D lab test.  She reiterated several times how much she enjoyed you as her provider and our office staff, she just wanted you informed.  Best number for patient [phone]

## 2013-01-24 NOTE — Telephone Encounter (Signed)
For any of my patients with low or under average bone mass- I add a vit D level to their yearly labs - if she does not want me to do this - just let me know each year - I really do not know which insurance covers what which makes things difficult - so in the future if she has labs here at a visit or has pre office visit labs makes sure she voices her concerns to the phlebotomist or myself so we do not order things her insurance does not cover Thanks for giving me the heads up !

## 2013-01-25 NOTE — Telephone Encounter (Signed)
Spoke with the patient regarding her bill for the Vitamin D test.  She went ahead and paid her bill with [facility].  Mills Koller called [facility] to be sure we had coded with the dx code for Osteopenia and Dr. Milinda Antis had, in fact, done that correctly despite what the patient was told by [facility].  Patient is not upset about the bill anymore.

## 2013-02-13 ENCOUNTER — Encounter: Payer: Self-pay | Admitting: Family Medicine

## 2013-02-13 ENCOUNTER — Ambulatory Visit (INDEPENDENT_AMBULATORY_CARE_PROVIDER_SITE_OTHER): Payer: PRIVATE HEALTH INSURANCE | Admitting: Family Medicine

## 2013-02-13 VITALS — BP 122/68 | HR 64 | Temp 97.3°F | Ht 60.5 in | Wt 122.4 lb

## 2013-02-13 DIAGNOSIS — J069 Acute upper respiratory infection, unspecified: Secondary | ICD-10-CM | POA: Insufficient documentation

## 2013-02-13 MED ORDER — BENZONATATE 200 MG PO CAPS: 200.0000 mg | ORAL_CAPSULE | Freq: Three times a day (TID) | ORAL | Status: DC | PRN

## 2013-02-13 NOTE — Patient Instructions (Addendum)
Drink lots of fluids and try to get rest  DM products otc may be helpful for cough Also tessalon was sent to the pharmacy Update if not starting to improve in a week or if worsening

## 2013-02-13 NOTE — Progress Notes (Signed)
Subjective:    Patient ID: Jennifer [name], female    DOB: [birth year], 57 y.o.   MRN: [id number]  HPI Here with uri symptoms -now at 2 weeks  Dry hacking cough -persists - and now has become more productive  Nasal congestion- using saline washes and guifenesin  The cong is causing her migraines to worsen - has had a series of them  One day - yellow/ green cong from nose - and no sinus pain  White mucous from chest   No fever   Had an old px for vicodin - and took a fraction of a tablet for cough - she is very sensitive to it and gets rebound symptoms from it   Tessalon in the past did not work well - but it was the lower dose product    Patient Active Problem List  Diagnosis  . HYPOTHYROIDISM  . MIGRAINE WITH AURA  . GERD  . OSTEOPENIA  . Recurrent cold sores  . Routine general medical examination at a health care facility   Past Medical History  Diagnosis Date  . Esophageal reflux   . Unspecified hypothyroidism   . Migraine with aura, without mention of intractable migraine without mention of status migrainosus   . Allergic rhinitis   . Osteopenia 10/2012    T score -2.1 FRAX 8%/1.1%  . Endometriosis    Past Surgical History  Procedure Laterality Date  . Rotator cuff repair  08/2008  . Dexa  5/10    osteopenia-stable on actonel  . Colonoscopy  2005    normal  . Cervical cone biopsy  1985    severe dysplasia  . Breast surgery      Benign breast lump excised   History  Substance Use Topics  . Smoking status: Never Smoker   . Smokeless tobacco: Not on file  . Alcohol Use: 1.5 oz/week    3 drink(s) per week     Comment: rare   Family History  Problem Relation Age of Onset  . Breast cancer Mother   . Diabetes Mother   . Hypertension Father   . Heart disease Father   . Osteoporosis Maternal Grandmother   . Diabetes Maternal Grandmother    Allergies  Allergen Reactions  . Codeine Phosphate Other (See Comments)    Dizziness, heart palpitations, nervous  .  Hydrocodone     REACTION: made nervous--10/09 surgery rotator cuff   Current Outpatient Prescriptions on File Prior to Visit  Medication Sig Dispense Refill  . acyclovir cream (ZOVIRAX) 5 % Apply 1 application topically as needed.  5 g  5  . ALPRAZolam (XANAX) 0.5 MG tablet Take 0.5-1 tablets (0.25-0.5 mg total) by mouth as needed for sleep or anxiety. For travel anxiety  15 tablet  1  . Ascorbic Acid (VITAMIN C) 500 MG tablet Take 500 mg by mouth daily.        Marland Kitchen atenolol (TENORMIN) 25 MG tablet Take 12.5 mg by mouth 3 (three) times daily.       . baclofen (LIORESAL) 10 MG tablet Take 10 mg by mouth as needed. For migraine prevention       . Calcium Carbonate-Vitamin D (CALCIUM 600+D) 600-400 MG-UNIT per tablet Take 1 tablet by mouth 2 (two) times daily.        Marland Kitchen desonide (DESOWEN) 0.05 % cream Apply to affected area 1 time daily as needed for eczema  60 g  5  . Evening Primrose Oil 1000 MG CAPS Take 1 capsule by mouth daily.        Marland Kitchen  fish oil-omega-3 fatty acids 1000 MG capsule Take 1 g by mouth daily.        . fluticasone (FLONASE) 50 MCG/ACT nasal spray USE 2 SPRAYS IN EACH NOSTRIL ONCE A DAY  16 g  5  . ketoprofen (ORUDIS) 75 MG capsule Take 75 mg by mouth as needed. With maxalt for migraines      . ketorolac (TORADOL) 10 MG tablet Take 10 mg by mouth as needed. With food for headache      . levothyroxine (SYNTHROID, LEVOTHROID) 50 MCG tablet TAKE 1 TABLET (50 MCG TOTAL) BY MOUTH DAILY.  30 tablet  6  . loratadine (CLARITIN) 10 MG tablet Take 10 mg by mouth daily.      . magnesium oxide (MAG-OX) 400 MG tablet Take 400 mg by mouth daily.        . norethindrone-ethinyl estradiol (FEMHRT 1/5) 1-5 MG-MCG TABS Take 1 tablet by mouth daily.  28 tablet  11  . pantoprazole (PROTONIX) 40 MG tablet Take 1 tablet (40 mg total) by mouth daily. For GERD  30 tablet  11  . promethazine (PHENERGAN) 25 MG tablet Take 25 mg by mouth as needed. When having a migraine      . rizatriptan (MAXALT) 10 MG tablet  Take 10 mg by mouth as needed. May repeat in 2 hours if needed      . valACYclovir (VALTREX) 500 MG tablet Take 1 tablet (500 mg total) by mouth daily.  30 tablet  1   No current facility-administered medications on file prior to visit.      Review of Systems Review of Systems  Constitutional: Negative for fever, appetite change,  and unexpected weight change. some fatigue ENT pos for cong/ neg for ST or sinus pain  Eyes: Negative for pain and visual disturbance.  Respiratory: Negative for wheeze and shortness of breath.   Cardiovascular: Negative for cp or palpitations    Gastrointestinal: Negative for nausea, diarrhea and constipation.  Genitourinary: Negative for urgency and frequency.  Skin: Negative for pallor or rash   Neurological: Negative for weakness, light-headedness, numbness and headaches.  Hematological: Negative for adenopathy. Does not bruise/bleed easily.  Psychiatric/Behavioral: Negative for dysphoric mood. The patient is not nervous/anxious.         Objective:   Physical Exam  Constitutional: She appears well-developed and well-nourished. No distress.  HENT:  Head: Normocephalic and atraumatic.  Right Ear: External ear normal.  Left Ear: External ear normal.  Mouth/Throat: Oropharynx is clear and moist. No oropharyngeal exudate.  Nares are boggy and injected   Eyes: Conjunctivae and EOM are normal. Pupils are equal, round, and reactive to light. Right eye exhibits no discharge. Left eye exhibits no discharge. No scleral icterus.  Neck: Normal range of motion. Neck supple. No JVD present. No thyromegaly present.  Cardiovascular: Normal rate and regular rhythm.   Pulmonary/Chest: Effort normal and breath sounds normal. No respiratory distress. She has no wheezes. She has no rales. She exhibits no tenderness.  Lymphadenopathy:    She has no cervical adenopathy.  Neurological: She is alert.  Skin: Skin is warm and dry. No rash noted.  Psychiatric: She has a  normal mood and affect.          Assessment & Plan:

## 2013-02-13 NOTE — Assessment & Plan Note (Signed)
At 2 weeks-is improving with residual cough Reassuring exam  Given px for tessalon - and can also use DM products otc to help the cough cycle Disc symptomatic care - see instructions on AVS  Update if not starting to improve in a week or if worsening

## 2013-04-04 ENCOUNTER — Other Ambulatory Visit: Payer: Self-pay | Admitting: Family Medicine

## 2013-04-13 ENCOUNTER — Other Ambulatory Visit: Payer: Self-pay

## 2013-04-13 MED ORDER — VALACYCLOVIR HCL 500 MG PO TABS: 500.0000 mg | ORAL_TABLET | Freq: Every day | ORAL | Status: DC

## 2013-04-13 NOTE — Telephone Encounter (Signed)
Pt presently in [facility] [place]; has flair up on lip with puffiness and redness; pt has one valtrex and request refill sent to [facility] in [place]. Pt will not return home until weekend.Please advise.

## 2013-04-13 NOTE — Telephone Encounter (Signed)
Please call in one refill, thanks

## 2013-06-05 ENCOUNTER — Other Ambulatory Visit: Payer: Self-pay | Admitting: Family Medicine

## 2013-06-05 NOTE — Telephone Encounter (Signed)
Please schedule PE in Jan and refill until then, thanks

## 2013-06-05 NOTE — Telephone Encounter (Signed)
Electronic refill request, no recent/future appt., please advise  

## 2013-06-09 ENCOUNTER — Encounter: Payer: PRIVATE HEALTH INSURANCE | Admitting: Gynecology

## 2013-06-27 ENCOUNTER — Other Ambulatory Visit: Payer: Self-pay

## 2013-06-27 MED ORDER — ALPRAZOLAM 0.5 MG PO TABS: 0.2500 mg | ORAL_TABLET | ORAL | Status: DC | PRN

## 2013-06-27 NOTE — Telephone Encounter (Signed)
Px written for call in   

## 2013-06-27 NOTE — Telephone Encounter (Signed)
Pt left v/m requesting refill xanax to [facility].Please advise.

## 2013-06-27 NOTE — Telephone Encounter (Signed)
Rx called in as prescribed, pt notified 

## 2013-08-29 ENCOUNTER — Other Ambulatory Visit: Payer: Self-pay | Admitting: Family Medicine

## 2013-08-29 NOTE — Telephone Encounter (Signed)
Electronic refill request, no recent/future appt., please advise  

## 2013-08-29 NOTE — Telephone Encounter (Signed)
Please schedule PE for January and refill until then, thanks

## 2013-08-31 NOTE — Telephone Encounter (Signed)
Pt called and scheduled first available CPX 01/10/14 at 2:30 pm; pantoprazole refill done and pt notified.

## 2013-08-31 NOTE — Telephone Encounter (Signed)
Left voicemail requesting pt to call office 

## 2013-09-18 ENCOUNTER — Other Ambulatory Visit: Payer: Self-pay

## 2013-09-28 ENCOUNTER — Ambulatory Visit: Payer: Self-pay | Admitting: Gynecology

## 2013-09-29 ENCOUNTER — Ambulatory Visit (INDEPENDENT_AMBULATORY_CARE_PROVIDER_SITE_OTHER): Payer: BC Managed Care – PPO | Admitting: Gynecology

## 2013-09-29 ENCOUNTER — Encounter: Payer: Self-pay | Admitting: Gynecology

## 2013-09-29 DIAGNOSIS — N644 Mastodynia: Secondary | ICD-10-CM

## 2013-09-29 NOTE — Progress Notes (Signed)
The patient presents complaining of left chest wall/mastalgia lateral tail of Spence junction the breast and chest wall over the past year. Comes and goes will go days without it. No palpable or visual abnormalities on self-exam. No nipple discharge or past trauma. Does wear an underwire bra. Went to get mammogram and she mentioned the discomfort in the did not do the mammogram but told her she needed to see her physician first.  Exam was [employer] Both breast examined lying and sitting without masses retractions discharge adenopathy. Area she's pointing to the junction of the breast tissue and chest wall.  Assessment and plan: Discomfort may be related to the underwire bras as is right where it comes up against the chest wall. Possible mastalgia, physiologic. No palpable abnormalities. Discussed decreased caffeine. We'll start with diagnostic mammography and ultrasound. Assuming negative then she'll continue self breast exams and as long as no palpable abnormalities and we'll follow expectantly.

## 2013-09-29 NOTE — Patient Instructions (Signed)
Office will contact you to arrange mammogram and ultrasound. 

## 2013-10-02 ENCOUNTER — Telehealth: Payer: Self-pay | Admitting: *Deleted

## 2013-10-02 DIAGNOSIS — N644 Mastodynia: Secondary | ICD-10-CM

## 2013-10-02 NOTE — Telephone Encounter (Signed)
Orders placed for the below note, breast center will call pt to schedule.

## 2013-10-02 NOTE — Telephone Encounter (Signed)
Message copied by Aura Camps on Mon Oct 02, 2013  8:45 AM ------      Message from: Dara Lords      Created: Fri Sep 29, 2013  2:45 PM       Arrange diagnostic mammography and ultrasound at breast Center reference left tail of Spence discomfort that comes and goes. No palpable abnormalities. Suspect physiologic. ------

## 2013-10-06 NOTE — Telephone Encounter (Signed)
I called patient because breast center tired to contact pt to schedule imaging. Pt said she was very busy and will call today to schedule.

## 2013-10-10 ENCOUNTER — Other Ambulatory Visit: Payer: Self-pay | Admitting: Family Medicine

## 2013-10-24 NOTE — Telephone Encounter (Signed)
Appointment 11/03/13 at breast center

## 2013-10-27 ENCOUNTER — Other Ambulatory Visit (HOSPITAL_COMMUNITY)
Admission: RE | Admit: 2013-10-27 | Discharge: 2013-10-27 | Disposition: A | Discharge status: Home / Self Care | Payer: BC Managed Care – PPO | Source: Ambulatory Visit | Attending: Gynecology | Admitting: Gynecology

## 2013-10-27 ENCOUNTER — Ambulatory Visit (INDEPENDENT_AMBULATORY_CARE_PROVIDER_SITE_OTHER): Payer: BC Managed Care – PPO | Admitting: Gynecology

## 2013-10-27 ENCOUNTER — Encounter: Payer: Self-pay | Admitting: Gynecology

## 2013-10-27 VITALS — BP 120/74 | Ht 61.0 in | Wt 115.0 lb

## 2013-10-27 DIAGNOSIS — B009 Herpesviral infection, unspecified: Secondary | ICD-10-CM

## 2013-10-27 DIAGNOSIS — E039 Hypothyroidism, unspecified: Secondary | ICD-10-CM

## 2013-10-27 DIAGNOSIS — Z01419 Encounter for gynecological examination (general) (routine) without abnormal findings: Secondary | ICD-10-CM

## 2013-10-27 DIAGNOSIS — N952 Postmenopausal atrophic vaginitis: Secondary | ICD-10-CM

## 2013-10-27 DIAGNOSIS — M8580 Other specified disorders of bone density and structure, unspecified site: Secondary | ICD-10-CM

## 2013-10-27 DIAGNOSIS — M899 Disorder of bone, unspecified: Secondary | ICD-10-CM

## 2013-10-27 DIAGNOSIS — Z79890 Hormone replacement therapy (postmenopausal): Secondary | ICD-10-CM

## 2013-10-27 DIAGNOSIS — Z1151 Encounter for screening for human papillomavirus (HPV): Secondary | ICD-10-CM | POA: Insufficient documentation

## 2013-10-27 DIAGNOSIS — B001 Herpesviral vesicular dermatitis: Secondary | ICD-10-CM

## 2013-10-27 MED ORDER — LEVOTHYROXINE SODIUM 50 MCG PO TABS: ORAL_TABLET | ORAL | Status: DC

## 2013-10-27 MED ORDER — NORETHINDRONE-ETH ESTRADIOL 1-5 MG-MCG PO TABS: 1.0000 | ORAL_TABLET | Freq: Every day | ORAL | Status: DC

## 2013-10-27 NOTE — Patient Instructions (Signed)
Followup for mammogram as arranged Schedule colonoscopy with [facility] at [phone] or [facility] gastro[facility]ology at [phone] Followup in one year for annual exam

## 2013-10-27 NOTE — Progress Notes (Signed)
Jeanette Rauth [birth year] [id number]        57 y.o.  G0P0 for annual exam.  Several issues noted below.  Past medical history,surgical history, problem list, medications, allergies, family history and social history were all reviewed and documented in the EPIC chart.  ROS:  Performed and pertinent positives and negatives are included in the history, assessment and plan .  Exam: Kim assistant Filed Vitals:   10/27/13 1112  BP: 120/74  Height: 5\' 1"  (1.549 m)  Weight: 115 lb (52.164 kg)   General appearance  Normal Skin grossly normal Head/Neck normal with no cervical or supraclavicular adenopathy thyroid normal Lungs  clear Cardiac RR, without RMG Abdominal  soft, nontender, without masses, organomegaly or hernia Breasts  examined lying and sitting without masses, retractions, discharge or axillary adenopathy. Pelvic  Ext/BUS/vagina  Normal with mild atrophic changes  Cervix  Normal with atrophic changes  Uterus  axial, normal size, shape and contour, midline and mobile nontender   Adnexa  Without masses or tenderness    Anus and perineum  Normal   Rectovaginal  Normal sphincter tone without palpated masses or tenderness.    Assessment/Plan:  57 y.o. G0P0 female for annual exam.   1. Postmenopausal/HRT. Currently on [facility] 1/5 for menopausal symptoms. Wants to continue. Reports no bleeding. I reviewed the whole issue of HRT with her to include the WHI study with increased risk of stroke, heart attack, DVT and breast cancer. The ACOG and NAMS statements for lowest dose for the shortest period of time reviewed. Transdermal versus oral first-pass effect benefit discussed. Patient's comfortable with continuing I refilled her x1 year. Patient knows to report any vaginal bleeding. 2. Osteopenia. DEXA 10/2012 with T score -2.1 FRAX 8%/1.1%. Stable from prior DEXA. Had used Actonel previously for approximately 5 years but has been off of this for several years.  Recommend repeat DEXA next year a  two-year interval. Vitamin D level today.  3. Hypothyroid. On 50 mcg daily. I refilled this for her and we'll check TSH. 4. Herpes labialis. Uses Valtrex intermittently. Has not used for a long time. Has supply at home but will call if she needs a refill. 5. Pap smear 2012. Results not available as done elsewhere but reported normal. Pap/HPV today. History of high-grade dysplasia with cone biopsy 1985. Assuming negative then we'll plan less frequent screening interval. 6. Mammography scheduled with ultrasound due to her mastalgia per 09/29/2013 note. Assuming negative then we'll plan expectant management with SBE monthly emphasized and annual mammography. 7. Colonoscopy 2005. Recommend followup colonoscopy this coming year. Patient agrees to call and schedule. 8. Health maintenance. Baseline CBC comprehensive metabolic panel lipid profile urinalysis TSH and vitamin D done. Followup one year, sooner as needed.   Note: This document was prepared with digital dictation and possible smart phrase technology. Any transcriptional errors that result from this process are unintentional.   Dara Lords MD, 12:05 PM 10/27/2013

## 2013-10-28 ENCOUNTER — Other Ambulatory Visit: Payer: Self-pay | Admitting: Family Medicine

## 2013-10-28 LAB — URINALYSIS W MICROSCOPIC + REFLEX CULTURE
Bacteria, UA: NONE SEEN
Glucose, UA: NEGATIVE mg/dL
Ketones, ur: NEGATIVE mg/dL
Nitrite: NEGATIVE
Urobilinogen, UA: 0.2 mg/dL (ref 0.0–1.0)

## 2013-11-03 ENCOUNTER — Ambulatory Visit
Admission: RE | Admit: 2013-11-03 | Discharge: 2013-11-03 | Disposition: A | Discharge status: Home / Self Care | Payer: BC Managed Care – PPO | Source: Ambulatory Visit | Attending: Gynecology | Admitting: Gynecology

## 2013-11-03 ENCOUNTER — Other Ambulatory Visit: Payer: BC Managed Care – PPO

## 2013-11-03 DIAGNOSIS — N644 Mastodynia: Secondary | ICD-10-CM

## 2013-12-28 ENCOUNTER — Other Ambulatory Visit: Payer: Self-pay | Admitting: Family Medicine

## 2013-12-28 NOTE — Telephone Encounter (Signed)
Please verify that this is the generic for femhrt - and refill times one  She has upcoming appt

## 2013-12-28 NOTE — Telephone Encounter (Signed)
Electronic refill request, pt had Rx refill for Femhrt on 10/27/13, and now pharmacy is requesting a refill of Jinteil, please advise

## 2013-12-29 NOTE — Telephone Encounter (Signed)
Spoke with pharmacy and Rx sent in error they still have pt's femhrt Rx on file and they will fill that, Rx declined and pharmacy is aware

## 2014-01-10 ENCOUNTER — Ambulatory Visit (INDEPENDENT_AMBULATORY_CARE_PROVIDER_SITE_OTHER): Payer: BC Managed Care – PPO | Admitting: Family Medicine

## 2014-01-10 ENCOUNTER — Encounter: Payer: Self-pay | Admitting: Family Medicine

## 2014-01-10 VITALS — BP 114/82 | HR 79 | Temp 97.7°F | Ht 60.5 in | Wt 115.2 lb

## 2014-01-10 DIAGNOSIS — M949 Disorder of cartilage, unspecified: Secondary | ICD-10-CM

## 2014-01-10 DIAGNOSIS — M899 Disorder of bone, unspecified: Secondary | ICD-10-CM

## 2014-01-10 DIAGNOSIS — E039 Hypothyroidism, unspecified: Secondary | ICD-10-CM

## 2014-01-10 DIAGNOSIS — Z0000 Encounter for general adult medical examination without abnormal findings: Secondary | ICD-10-CM

## 2014-01-10 LAB — TSH: TSH: 1.32 u[IU]/mL (ref 0.35–5.50)

## 2014-01-10 LAB — LIPID PANEL
CHOL/HDL RATIO: 3
Cholesterol: 168 mg/dL (ref 0–200)
HDL: 56.3 mg/dL (ref >39.00)
TRIGLYCERIDES: 261 mg/dL — AB (ref 0.0–149.0)
VLDL: 52.2 mg/dL — ABNORMAL HIGH (ref 0.0–40.0)

## 2014-01-10 LAB — COMPREHENSIVE METABOLIC PANEL
ALK PHOS: 48 U/L (ref 39–117)
ALT: 18 U/L (ref 0–35)
AST: 14 U/L (ref 0–37)
Albumin: 4.1 g/dL (ref 3.5–5.2)
BILIRUBIN TOTAL: 0.6 mg/dL (ref 0.3–1.2)
BUN: 9 mg/dL (ref 6–23)
CO2: 26 mEq/L (ref 19–32)
CREATININE: 0.6 mg/dL (ref 0.4–1.2)
Calcium: 9 mg/dL (ref 8.4–10.5)
Chloride: 102 mEq/L (ref 96–112)
GFR: 101.56 mL/min (ref >60.00)
Glucose, Bld: 82 mg/dL (ref 70–99)
Potassium: 3.5 mEq/L (ref 3.5–5.1)
SODIUM: 135 meq/L (ref 135–145)
Total Protein: 7.2 g/dL (ref 6.0–8.3)

## 2014-01-10 LAB — CBC WITH DIFFERENTIAL/PLATELET
BASOS ABS: 0 10*3/uL (ref 0.0–0.1)
Basophils Relative: 0.6 % (ref 0.0–3.0)
Eosinophils Absolute: 0.1 10*3/uL (ref 0.0–0.7)
Eosinophils Relative: 1.1 % (ref 0.0–5.0)
HEMATOCRIT: 41.1 % (ref 36.0–46.0)
Hemoglobin: 13.9 g/dL (ref 12.0–15.0)
LYMPHS ABS: 2.1 10*3/uL (ref 0.7–4.0)
Lymphocytes Relative: 31.6 % (ref 12.0–46.0)
MCHC: 33.7 g/dL (ref 30.0–36.0)
MCV: 95.1 fl (ref 78.0–100.0)
Monocytes Absolute: 0.5 10*3/uL (ref 0.1–1.0)
Monocytes Relative: 7.1 % (ref 3.0–12.0)
NEUTROS ABS: 3.9 10*3/uL (ref 1.4–7.7)
Neutrophils Relative %: 59.6 % (ref 43.0–77.0)
PLATELETS: 281 10*3/uL (ref 150.0–400.0)
RBC: 4.33 Mil/uL (ref 3.87–5.11)
RDW: 12.2 % (ref 11.5–14.6)
WBC: 6.5 10*3/uL (ref 4.5–10.5)

## 2014-01-10 LAB — LDL CHOLESTEROL, DIRECT: Direct LDL: 98.6 mg/dL

## 2014-01-10 MED ORDER — PANTOPRAZOLE SODIUM 40 MG PO TBEC: DELAYED_RELEASE_TABLET | ORAL | Status: DC

## 2014-01-10 MED ORDER — ALPRAZOLAM 0.5 MG PO TABS: 0.2500 mg | ORAL_TABLET | ORAL | Status: DC | PRN

## 2014-01-10 MED ORDER — LEVOTHYROXINE SODIUM 50 MCG PO TABS: ORAL_TABLET | ORAL | Status: DC

## 2014-01-10 NOTE — Patient Instructions (Signed)
Take care of yourself  Stay active and eat a healthy diet  Labs today  Call back when you are ready to schedule your colonoscopy

## 2014-01-10 NOTE — Progress Notes (Signed)
Subjective:    Patient ID: Jennifer [name], female    DOB: 1955-12-03, 58 y.o.   MRN: [id number]  HPI Here for health maintenance exam and to review chronic medical problems    Is doing ok overall -mentally exhausted  Staying motivated overall   Wt is stable with bmi of 22  She lost her husband in sept - very tough / lost wt and put it back on (appetite is coming back) She has not had grief counseling - may do that later , and her faith is helping a lot  Now is sleeping again also  Has xanax on hand for when she needs it   Flu vaccine- got that in sept  colonosc 2/05- is about time , she is not quite ready- will call back for that  Mammogram 12/14 nl Self exam-no lumps   Pap 12/14 nl- gyn exam and pap -was ok  Hx of conization  Osteopenia  dexa 12/13 -up to date   Hypothyroid-due for labs  Needs all of her wellness labs  Had some labs at gyn    Diet is fair-no red meat / worries about iron   Td 12/13   Patient Active Problem List   Diagnosis Date Noted  . Routine general medical examination at a health care facility 10/28/2012  . Recurrent cold sores 04/28/2011  . GERD 09/16/2010  . HYPOTHYROIDISM 05/07/2008  . OSTEOPENIA 05/07/2008  . MIGRAINE WITH AURA 11/11/2007   Past Medical History  Diagnosis Date  . Esophageal reflux   . Migraine with aura, without mention of intractable migraine without mention of status migrainosus   . Allergic rhinitis   . Osteopenia 10/2012    T score -2.1 FRAX 8%/1.1%  . Endometriosis   . Cervical dysplasia   . Hypothyroid    Past Surgical History  Procedure Laterality Date  . Rotator cuff repair  08/2008  . Colonoscopy  2005    normal  . Cervical cone biopsy  1985    severe dysplasia  . Breast surgery      Benign breast lump excised  . Colposcopy     History  Substance Use Topics  . Smoking status: Never Smoker   . Smokeless tobacco: Not on file  . Alcohol Use: No   Family History  Problem Relation Age of Onset  .  Breast cancer Mother 58  . Diabetes Mother   . Hypertension Father   . Heart disease Father   . Osteoporosis Maternal Grandmother   . Diabetes Maternal Grandmother    Allergies  Allergen Reactions  . Codeine Phosphate Other (See Comments)    Dizziness, heart palpitations, nervous  . Hydrocodone     REACTION: made nervous--10/09 surgery rotator cuff   Current Outpatient Prescriptions on File Prior to Visit  Medication Sig Dispense Refill  . ALPRAZolam (XANAX) 0.5 MG tablet Take 0.5-1 tablets (0.25-0.5 mg total) by mouth as needed for sleep or anxiety. For travel anxiety  15 tablet  0  . atenolol (TENORMIN) 25 MG tablet Take 12.5 mg by mouth 3 (three) times daily.       . baclofen (LIORESAL) 10 MG tablet Take 10 mg by mouth as needed. For migraine prevention       . fish oil-omega-3 fatty acids 1000 MG capsule Take 1 g by mouth daily.        . fluticasone (FLONASE) 50 MCG/ACT nasal spray USE 2 SPRAYS INTO EACH NOSTRIL DAILY  16 g  4  . ketoprofen (ORUDIS) 75  MG capsule Take 75 mg by mouth as needed. With maxalt for migraines      . ketorolac (TORADOL) 10 MG tablet Take 10 mg by mouth as needed. With food for headache      . levothyroxine (SYNTHROID, LEVOTHROID) 50 MCG tablet TAKE 1 TABLET BY MOUTH EVERY DAY  30 tablet  11  . loratadine (CLARITIN) 10 MG tablet Take 10 mg by mouth daily.      . norethindrone-ethinyl estradiol (FEMHRT 1/5) 1-5 MG-MCG TABS Take 1 tablet by mouth daily.  28 tablet  11  . pantoprazole (PROTONIX) 40 MG tablet TAKE 1 TABLET EVERY DAY  30 tablet  6  . promethazine (PHENERGAN) 25 MG tablet Take 25 mg by mouth as needed. When having a migraine      . rizatriptan (MAXALT) 10 MG tablet Take 10 mg by mouth as needed. May repeat in 2 hours if needed       No current facility-administered medications on file prior to visit.    Review of Systems Review of Systems  Constitutional: Negative for fever, appetite change,  and unexpected weight change. pos for fatigue    Eyes: Negative for pain and visual disturbance.  Respiratory: Negative for cough and shortness of breath.   Cardiovascular: Negative for cp or palpitations    Gastrointestinal: Negative for nausea, diarrhea and constipation.  Genitourinary: Negative for urgency and frequency.  Skin: Negative for pallor or rash   Neurological: Negative for weakness, light-headedness, numbness and headaches.  Hematological: Negative for adenopathy. Does not bruise/bleed easily.  Psychiatric/Behavioral: Negative for dysphoric mood. The patient is not nervous/anxious.  pos for grief reaction        Objective:   Physical Exam  Constitutional: She appears well-developed and well-nourished. No distress.  HENT:  Head: Normocephalic and atraumatic.  Right Ear: External ear normal.  Left Ear: External ear normal.  Nose: Nose normal.  Mouth/Throat: Oropharynx is clear and moist.  Eyes: Conjunctivae and EOM are normal. Pupils are equal, round, and reactive to light. Right eye exhibits no discharge. Left eye exhibits no discharge. No scleral icterus.  Neck: Normal range of motion. Neck supple. No JVD present. No thyromegaly present.  Cardiovascular: Normal rate, regular rhythm, normal heart sounds and intact distal pulses.  Exam reveals no gallop.   Pulmonary/Chest: Effort normal and breath sounds normal. No respiratory distress. She has no wheezes. She has no rales.  Abdominal: Soft. Bowel sounds are normal. She exhibits no distension and no mass. There is no tenderness.  Musculoskeletal: She exhibits no edema and no tenderness.  Lymphadenopathy:    She has no cervical adenopathy.  Neurological: She is alert. She has normal reflexes. No cranial nerve deficit. She exhibits normal muscle tone. Coordination normal.  Skin: Skin is warm and dry. No rash noted. No erythema. No pallor.  Psychiatric: She has a normal mood and affect.  Pleasant and talkative           Assessment & Plan:

## 2014-01-10 NOTE — Progress Notes (Signed)
Pre-visit discussion using our clinic review tool. No additional management support is needed unless otherwise documented below in the visit note.  

## 2014-01-11 ENCOUNTER — Encounter: Payer: Self-pay | Admitting: Family Medicine

## 2014-01-11 NOTE — Assessment & Plan Note (Signed)
Reviewed health habits including diet and exercise and skin cancer prevention Reviewed appropriate screening tests for age  Also reviewed health mt list, fam hx and immunization status , as well as social and family history   Lab today   Disc grief rxn/ loss of husband- pt states she is doing ok and does not feel depressed overall/ stays motivated

## 2014-01-11 NOTE — Assessment & Plan Note (Signed)
utd dexa Watched by her gyn  Disc need for calcium/ vitamin D/ wt bearing exercise and bone density test every 2 y to monitor Disc safety/ fracture risk in detail

## 2014-01-11 NOTE — Assessment & Plan Note (Signed)
No clinical changes Reassuring exam  Lab today for tsh

## 2014-01-12 ENCOUNTER — Encounter: Payer: Self-pay | Admitting: *Deleted

## 2014-01-30 ENCOUNTER — Other Ambulatory Visit: Payer: Self-pay | Admitting: Family Medicine

## 2014-04-18 ENCOUNTER — Ambulatory Visit (INDEPENDENT_AMBULATORY_CARE_PROVIDER_SITE_OTHER): Payer: BC Managed Care – PPO | Admitting: Family Medicine

## 2014-04-18 ENCOUNTER — Encounter: Payer: Self-pay | Admitting: Family Medicine

## 2014-04-18 VITALS — BP 122/68 | HR 59 | Temp 98.8°F | Ht 60.5 in | Wt 116.2 lb

## 2014-04-18 DIAGNOSIS — M25519 Pain in unspecified shoulder: Secondary | ICD-10-CM

## 2014-04-18 DIAGNOSIS — M25512 Pain in left shoulder: Secondary | ICD-10-CM | POA: Insufficient documentation

## 2014-04-18 DIAGNOSIS — M542 Cervicalgia: Secondary | ICD-10-CM | POA: Insufficient documentation

## 2014-04-18 NOTE — Patient Instructions (Signed)
Stop up front and tell the staff to call you regarding the physical therapy referral  Use heat to relax muscles  Continue massage  Chiropractor is ok  Get a foam cervical support pillow  Use baclofen as needed

## 2014-04-18 NOTE — Progress Notes (Signed)
Pre visit review using our clinic review tool, if applicable. No additional management support is needed unless otherwise documented below in the visit note. 

## 2014-04-18 NOTE — Progress Notes (Signed)
Subjective:    Patient ID: Jennifer [name], female    DOB: 1956-07-24, 58 y.o.   MRN: [id number]  HPI Here for shoulder and back pain  Past month- over the L trapezius area - and it hurts to abduct her arm  Now headaches are getting worse  She has been getting massages 1-2 times per week - and this immediately helps her rom for about a day  Has baclofen- takes 1/2 at a time - helps quite a bit    Neuro is doing trigger point shots for her migraines - lidocaine and steriods  This helped to begin with  Then she started having more headaches   Patient Active Problem List   Diagnosis Date Noted  . Routine general medical examination at a health care facility 10/28/2012  . Recurrent cold sores 04/28/2011  . GERD 09/16/2010  . HYPOTHYROIDISM 05/07/2008  . OSTEOPENIA 05/07/2008  . MIGRAINE WITH AURA 11/11/2007   Past Medical History  Diagnosis Date  . Esophageal reflux   . Migraine with aura, without mention of intractable migraine without mention of status migrainosus   . Allergic rhinitis   . Osteopenia 10/2012    T score -2.1 FRAX 8%/1.1%  . Endometriosis   . Cervical dysplasia   . Hypothyroid    Past Surgical History  Procedure Laterality Date  . Rotator cuff repair  08/2008  . Colonoscopy  2005    normal  . Cervical cone biopsy  1985    severe dysplasia  . Breast surgery      Benign breast lump excised  . Colposcopy     History  Substance Use Topics  . Smoking status: Never Smoker   . Smokeless tobacco: Not on file  . Alcohol Use: No   Family History  Problem Relation Age of Onset  . Breast cancer Mother 40  . Diabetes Mother   . Hypertension Father   . Heart disease Father   . Osteoporosis Maternal Grandmother   . Diabetes Maternal Grandmother    Allergies  Allergen Reactions  . Codeine Phosphate Other (See Comments)    Dizziness, heart palpitations, nervous  . Hydrocodone     REACTION: made nervous--10/09 surgery rotator cuff   Current Outpatient  Prescriptions on File Prior to Visit  Medication Sig Dispense Refill  . ALPRAZolam (XANAX) 0.5 MG tablet Take 0.5-1 tablets (0.25-0.5 mg total) by mouth as needed for sleep or anxiety. For travel anxiety  15 tablet  0  . atenolol (TENORMIN) 25 MG tablet Take 12.5 mg by mouth 3 (three) times daily.       . baclofen (LIORESAL) 10 MG tablet Take 10 mg by mouth as needed. For migraine prevention       . fish oil-omega-3 fatty acids 1000 MG capsule Take 1 g by mouth daily.        . fluticasone (FLONASE) 50 MCG/ACT nasal spray USE 2 SPRAYS INTO EACH NOSTRIL DAILY  16 g  4  . ketoprofen (ORUDIS) 75 MG capsule Take 75 mg by mouth as needed. With maxalt for migraines      . ketorolac (TORADOL) 10 MG tablet Take 10 mg by mouth as needed. With food for Migraine      . levothyroxine (SYNTHROID, LEVOTHROID) 50 MCG tablet TAKE 1 TABLET BY MOUTH EVERY DAY  30 tablet  11  . loratadine (CLARITIN) 10 MG tablet Take 10 mg by mouth daily.      . norethindrone-ethinyl estradiol (FEMHRT 1/5) 1-5 MG-MCG TABS Take 1 tablet  by mouth daily.  28 tablet  11  . pantoprazole (PROTONIX) 40 MG tablet TAKE 1 TABLET EVERY DAY  30 tablet  11  . promethazine (PHENERGAN) 25 MG tablet Take 25 mg by mouth as needed. When having a migraine      . rizatriptan (MAXALT) 10 MG tablet Take 10 mg by mouth as needed. May repeat in 2 hours if needed       No current facility-administered medications on file prior to visit.    Review of Systems Review of Systems  Constitutional: Negative for fever, appetite change, fatigue and unexpected weight change.  Eyes: Negative for pain and visual disturbance.  Respiratory: Negative for cough and shortness of breath.   Cardiovascular: Negative for cp or palpitations    Gastrointestinal: Negative for nausea, diarrhea and constipation.  Genitourinary: Negative for urgency and frequency.  Skin: Negative for pallor or rash   MSK pos for pain in L neck and shoulder area  Neurological: Negative for  weakness, light-headedness, numbness and pos for  headaches.  Hematological: Negative for adenopathy. Does not bruise/bleed easily.  Psychiatric/Behavioral: Negative for dysphoric mood. The patient is not nervous/anxious.         Objective:   Physical Exam  Constitutional: She appears well-developed and well-nourished. No distress.  HENT:  Head: Normocephalic and atraumatic.  Eyes: Conjunctivae and EOM are normal. Pupils are equal, round, and reactive to light.  Neck: Normal range of motion. Neck supple.  Cardiovascular: Normal rate and regular rhythm.   Musculoskeletal: She exhibits tenderness. She exhibits no edema.       Left shoulder: She exhibits decreased range of motion and tenderness. She exhibits no bony tenderness, no swelling, no effusion, no crepitus, no deformity, normal pulse and normal strength.       Cervical back: She exhibits tenderness and spasm. She exhibits normal range of motion, no bony tenderness, no swelling and no deformity.  Tender in L cervical musculature/trapezius (with spasm) and over anterior shoulder  Pain to passively or actively abd arm over 90 deg  Otherwise nl rom  Nl int/ext rot of shoulder (nl scratch test) Some pain on hawkings maneuver     Lymphadenopathy:    She has no cervical adenopathy.  Neurological: She is alert. She has normal strength and normal reflexes. She displays no atrophy. No cranial nerve deficit or sensory deficit. She exhibits normal muscle tone. Coordination normal.  Skin: Skin is warm and dry. No rash noted. No erythema.  Psychiatric: She has a normal mood and affect.          Assessment & Plan:

## 2014-04-19 NOTE — Assessment & Plan Note (Signed)
Rad to L shoulder-muscular in nature with imp upon massage and use of baclofen Adv heat/ stretches/cerv support pillow  Continue baclofen prn  She plans on chiropractic visit also  Ref to PT  Update if not starting to improve in a week or if worsening  Or if any neuro symptoms

## 2014-06-25 ENCOUNTER — Encounter: Payer: Self-pay | Admitting: Internal Medicine

## 2014-06-25 ENCOUNTER — Ambulatory Visit (INDEPENDENT_AMBULATORY_CARE_PROVIDER_SITE_OTHER): Payer: BC Managed Care – PPO | Admitting: Internal Medicine

## 2014-06-25 VITALS — BP 100/70 | HR 65 | Temp 98.5°F | Wt 117.0 lb

## 2014-06-25 DIAGNOSIS — N342 Other urethritis: Secondary | ICD-10-CM

## 2014-06-25 NOTE — Progress Notes (Signed)
Subjective:    Patient ID: Jennifer [name], female    DOB: Aug 25, 1956, 58 y.o.   MRN: [id number]  HPI  Pt presents to the clinic today with c/o inflammation in the vaginal area. She noticed this 2 days ago. She reports a area near her urethra that feels like a "sac that is long and hard". She denies vaginal pain, discharge, odor. She denies all urinary symptoms. She denies fever or chills.  Review of Systems      Past Medical History  Diagnosis Date  . Esophageal reflux   . Migraine with aura, without mention of intractable migraine without mention of status migrainosus   . Allergic rhinitis   . Osteopenia 10/2012    T score -2.1 FRAX 8%/1.1%  . Endometriosis   . Cervical dysplasia   . Hypothyroid     Current Outpatient Prescriptions  Medication Sig Dispense Refill  . ALPRAZolam (XANAX) 0.5 MG tablet Take 0.5-1 tablets (0.25-0.5 mg total) by mouth as needed for sleep or anxiety. For travel anxiety  15 tablet  0  . atenolol (TENORMIN) 25 MG tablet Take 12.5 mg by mouth 3 (three) times daily.       . baclofen (LIORESAL) 10 MG tablet Take 10 mg by mouth as needed. For migraine prevention       . fish oil-omega-3 fatty acids 1000 MG capsule Take 1 g by mouth daily.        . fluticasone (FLONASE) 50 MCG/ACT nasal spray USE 2 SPRAYS INTO EACH NOSTRIL DAILY  16 g  4  . ketoprofen (ORUDIS) 75 MG capsule Take 75 mg by mouth as needed. With maxalt for migraines      . ketorolac (TORADOL) 10 MG tablet Take 10 mg by mouth as needed. With food for Migraine      . levothyroxine (SYNTHROID, LEVOTHROID) 50 MCG tablet TAKE 1 TABLET BY MOUTH EVERY DAY  30 tablet  11  . loratadine (CLARITIN) 10 MG tablet Take 10 mg by mouth daily.      . norethindrone-ethinyl estradiol (FEMHRT 1/5) 1-5 MG-MCG TABS Take 1 tablet by mouth daily.  28 tablet  11  . pantoprazole (PROTONIX) 40 MG tablet TAKE 1 TABLET EVERY DAY  30 tablet  11  . promethazine (PHENERGAN) 25 MG tablet Take 25 mg by mouth as needed. When having  a migraine      . rizatriptan (MAXALT) 10 MG tablet Take 10 mg by mouth as needed. May repeat in 2 hours if needed       No current facility-administered medications for this visit.    Allergies  Allergen Reactions  . Codeine Phosphate Other (See Comments)    Dizziness, heart palpitations, nervous  . Hydrocodone     REACTION: made nervous--10/09 surgery rotator cuff    Family History  Problem Relation Age of Onset  . Breast cancer Mother 46  . Diabetes Mother   . Hypertension Father   . Heart disease Father   . Osteoporosis Maternal Grandmother   . Diabetes Maternal Grandmother     History   Social History  . Marital Status: Married    Spouse Name: N/A    Number of Children: N/A  . 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 Smoking status: Never Smoker   . Smokeless tobacco: Not on file  . Alcohol Use: No  . Drug Use: No  . Sexual Activity: Not Currently    Birth  Control/ Protection: Post-menopausal   Other Topics Concern  . Not on file   Social History Narrative   Mangum her husband in 9/14     Constitutional: Denies fever, malaise, fatigue, headache or abrupt weight changes.  Gastrointestinal: Denies abdominal pain, bloating, constipation, diarrhea or blood in the stool.  GU: Denies urgency, frequency, pain with urination, burning sensation, blood in urine, odor or discharge. Skin: Pt reports inflammation in the vaginal area. Denies rashes, lesions or ulcercations.    No other specific complaints in a complete review of systems (except as listed in HPI above).  Objective:   Physical Exam  BP 100/70  Pulse 65  Temp(Src) 98.5 F (36.9 C) (Tympanic)  Wt 117 lb (53.071 kg)  SpO2 99% Wt Readings from Last 3 Encounters:  06/25/14 117 lb (53.071 kg)  04/18/14 116 lb 4 oz (52.731 kg)  01/10/14 115 lb 4 oz (52.277 kg)    General: Appears her stated age, well developed, well nourished in  NAD. Cardiovascular: Normal rate and rhythm. S1,S2 noted.  No murmur, rubs or gallops noted. No JVD or BLE edema. No carotid bruits noted. Pulmonary/Chest: Normal effort and positive vesicular breath sounds. No respiratory distress. No wheezes, rales or ronchi noted.  Abdomen: Soft and nontender. Normal bowel sounds, no bruits noted. No distention or masses noted. Liver, spleen and kidneys non palpable. Pelvic: Normal female anatomy. No discharge noted from vaginal opening. Urethra large and inflamed.  BMET    Component Value Date/Time   NA 135 01/10/2014 1516   K 3.5 01/10/2014 1516   CL 102 01/10/2014 1516   CO2 26 01/10/2014 1516   GLUCOSE 82 01/10/2014 1516   BUN 9 01/10/2014 1516   CREATININE 0.6 01/10/2014 1516   CALCIUM 9.0 01/10/2014 1516   GFRNONAA 93.07 08/21/2009 1107    Lipid Panel     Component Value Date/Time   CHOL 168 01/10/2014 1516   TRIG 261.0* 01/10/2014 1516   HDL 56.30 01/10/2014 1516   CHOLHDL 3 01/10/2014 1516   VLDL 52.2* 01/10/2014 1516   LDLCALC 108* 10/28/2012 1053    CBC    Component Value Date/Time   WBC 6.5 01/10/2014 1516   RBC 4.33 01/10/2014 1516   HGB 13.9 01/10/2014 1516   HCT 41.1 01/10/2014 1516   PLT 281.0 01/10/2014 1516   MCV 95.1 01/10/2014 1516   MCHC 33.7 01/10/2014 1516   RDW 12.2 01/10/2014 1516   LYMPHSABS 2.1 01/10/2014 1516   MONOABS 0.5 01/10/2014 1516   EOSABS 0.1 01/10/2014 1516   BASOSABS 0.0 01/10/2014 1516    Hgb A1C No results found for this basename: HGBA1C         Assessment & Plan:   External urethritis:  She declines to give a urine sample today Advised to push fluids Cool compresses to the area may be helpful Ibuprofen for pain/inflammation  RTC as needed

## 2014-06-25 NOTE — Patient Instructions (Signed)
Urethritis °Urethritis is an inflammation of the tube through which urine exits your bladder (urethra).  °CAUSES °Urethritis is often caused by an infection in your urethra. The infection can be viral, like herpes. The infection can also be bacterial, like gonorrhea. °RISK FACTORS °Risk factors of urethritis include: °· Having sex without using a condom. °· Having multiple sexual partners. °· Having poor hygiene. °SIGNS AND SYMPTOMS °Symptoms of urethritis are less noticeable in women than in men. These symptoms include: °· Burning feeling when you urinate (dysuria). °· Discharge from your urethra. °· Blood in your urine (hematuria). °· Urinating more than usual. °DIAGNOSIS  °To confirm a diagnosis of urethritis, your health care provider will do the following: °· Ask about your sexual history. °· Perform a physical exam. °· Have you provide a sample of your urine for lab testing. °· Use a cotton swab to gently collect a sample from your urethra for lab testing. °TREATMENT  °It is important to treat urethritis. Depending on the cause, untreated urethritis may lead to serious genital infections and possibly infertility. Urethritis caused by a bacterial infection is treated with antibiotic medicine. All sexual partners must be treated.  °HOME CARE INSTRUCTIONS °· Do not have sex until the test results are known and treatment is completed, even if your symptoms go away before you finish treatment. °· If you were prescribed an antibiotic, finish it all even if you start to feel better. °SEEK MEDICAL CARE IF:  °· Your symptoms are not improved in 3 days. °· Your symptoms are getting worse. °· You develop abdominal pain or pelvic pain (in women). °· You develop joint pain. °· You have a fever. °SEEK IMMEDIATE MEDICAL CARE IF:  °· You have severe pain in the belly, back, or side. °· You have repeated vomiting. °MAKE SURE YOU: °· Understand these instructions. °· Will watch your condition. °· Will get help right away if you  are not doing well or get worse. °Document Released: 05/05/2001 Document Revised: 03/26/2014 Document Reviewed: 07/10/2013 °[employer]® Patient Information ©2015 [employer]. This information is not intended to replace advice given to you by your health care provider. Make sure you discuss any questions you have with your health care provider. ° °

## 2014-06-25 NOTE — Progress Notes (Signed)
Pre visit review using our clinic review tool, if applicable. No additional management support is needed unless otherwise documented below in the visit note. 

## 2014-08-03 ENCOUNTER — Other Ambulatory Visit: Payer: Self-pay

## 2014-08-03 MED ORDER — ALPRAZOLAM 0.5 MG PO TABS: 0.2500 mg | ORAL_TABLET | ORAL | Status: DC | PRN

## 2014-08-03 NOTE — Telephone Encounter (Signed)
Rx called in as prescribed 

## 2014-08-03 NOTE — Telephone Encounter (Signed)
Pt left v/m requesting refill alprazolam to [facility].Please advise.

## 2014-08-03 NOTE — Telephone Encounter (Signed)
Px written for call in   

## 2014-08-07 ENCOUNTER — Ambulatory Visit (INDEPENDENT_AMBULATORY_CARE_PROVIDER_SITE_OTHER): Payer: BC Managed Care – PPO | Admitting: Family Medicine

## 2014-08-07 ENCOUNTER — Encounter: Payer: Self-pay | Admitting: Family Medicine

## 2014-08-07 VITALS — BP 106/70 | HR 61 | Temp 97.9°F | Ht 60.5 in | Wt 116.5 lb

## 2014-08-07 DIAGNOSIS — N342 Other urethritis: Secondary | ICD-10-CM | POA: Insufficient documentation

## 2014-08-07 DIAGNOSIS — N949 Unspecified condition associated with female genital organs and menstrual cycle: Secondary | ICD-10-CM | POA: Insufficient documentation

## 2014-08-07 DIAGNOSIS — R300 Dysuria: Secondary | ICD-10-CM | POA: Insufficient documentation

## 2014-08-07 LAB — POCT WET PREP (WET MOUNT): KOH Wet Prep POC: NEGATIVE

## 2014-08-07 LAB — POCT URINALYSIS DIPSTICK
Glucose, UA: NEGATIVE
Nitrite, UA: NEGATIVE
SPEC GRAV UA: 1.01
Urobilinogen, UA: 0.2
pH, UA: 7

## 2014-08-07 MED ORDER — CEPHALEXIN 250 MG PO CAPS: 250.0000 mg | ORAL_CAPSULE | Freq: Two times a day (BID) | ORAL | Status: DC

## 2014-08-07 NOTE — Patient Instructions (Addendum)
Avoid baths Avoid swimming or hot tubs until this improves Drink water only as much as possible - avoid coffee/soda and tea which can irritate bladder and urethra  Cleanse area with water only-and if you cannot, then use a color and fragrance free product  Take the keflex antibiotic as directed  We will send urine for a culture and get back to you with a result

## 2014-08-07 NOTE — Progress Notes (Signed)
Pre visit review using our clinic review tool, if applicable. No additional management support is needed unless otherwise documented below in the visit note. 

## 2014-08-07 NOTE — Progress Notes (Signed)
Subjective:    Patient ID: Jennifer [name], female    DOB: Sep 06, 1956, 58 y.o.   MRN: [id number]  HPI Here with vaginal issues   Was here recently for urethritis  At that time she felt swelling in the area   She admits to over wiping when she urinates  Now is trying to be more careful   States that she was not asked to give a urine sample today   Had some improvement  At times - still a little uncomfortable  Also a burning sensation at times (not to urinate- just uncomfortable)  No vaginal d/c or itching or bleeding  No blood in urine  Perhaps more frequency for the past several months   She does drink a lot of fluids  Drinks sparkling water /some unsweet tea   No fever  Had a bit of nausea once - thought it was stress   She does not take baths  She does cleanse with aaveda or neutrogena body wash -with scent   No worries about STD exposure   Results for orders placed in visit on 08/07/14  POCT WET PREP (WET [place])      Result Value Ref Range   Source Wet Prep POC vag     WBC, Wet Prep HPF POC many     Bacteria Wet Prep HPF POC few     BACTERIA WET PREP MORPHOLOGY POC       Clue Cells Wet Prep HPF POC None     CLUE CELLS WET PREP WHIFF POC       Yeast Wet Prep HPF POC None     KOH Wet Prep POC neg     Trichomonas Wet Prep HPF POC none       Patient Active Problem List   Diagnosis Date Noted  . Vaginal discomfort 08/07/2014  . Urethritis 08/07/2014  . Neck pain on left side 04/18/2014  . Left shoulder pain 04/18/2014  . Routine general medical examination at a health care facility 10/28/2012  . Recurrent cold sores 04/28/2011  . GERD 09/16/2010  . HYPOTHYROIDISM 05/07/2008  . OSTEOPENIA 05/07/2008  . MIGRAINE WITH AURA 11/11/2007   Past Medical History  Diagnosis Date  . Esophageal reflux   . Migraine with aura, without mention of intractable migraine without mention of status migrainosus   . Allergic rhinitis   . Osteopenia 10/2012    T score -2.1 FRAX  8%/1.1%  . Endometriosis   . Cervical dysplasia   . Hypothyroid    Past Surgical History  Procedure Laterality Date  . Rotator cuff repair  08/2008  . Colonoscopy  2005    normal  . Cervical cone biopsy  1985    severe dysplasia  . Breast surgery      Benign breast lump excised  . Colposcopy     History  Substance Use Topics  . Smoking status: Never Smoker   . Smokeless tobacco: Not on file  . Alcohol Use: No   Family History  Problem Relation Age of Onset  . Breast cancer Mother 59  . Diabetes Mother   . Hypertension Father   . Heart disease Father   . Osteoporosis Maternal Grandmother   . Diabetes Maternal Grandmother    Allergies  Allergen Reactions  . Codeine Phosphate Other (See Comments)    Dizziness, heart palpitations, nervous  . Hydrocodone     REACTION: made nervous--10/09 surgery rotator cuff   Current Outpatient Prescriptions on File Prior to Visit  Medication  Sig Dispense Refill  . ALPRAZolam (XANAX) 0.5 MG tablet Take 0.5-1 tablets (0.25-0.5 mg total) by mouth as needed for sleep or anxiety. For travel anxiety  15 tablet  0  . atenolol (TENORMIN) 25 MG tablet Take 12.5 mg by mouth 3 (three) times daily.       . baclofen (LIORESAL) 10 MG tablet Take 10 mg by mouth as needed. For migraine prevention       . fish oil-omega-3 fatty acids 1000 MG capsule Take 1 g by mouth daily.        . fluticasone (FLONASE) 50 MCG/ACT nasal spray USE 2 SPRAYS INTO EACH NOSTRIL DAILY  16 g  4  . ketorolac (TORADOL) 10 MG tablet Take 10 mg by mouth as needed. With food for Migraine      . levothyroxine (SYNTHROID, LEVOTHROID) 50 MCG tablet TAKE 1 TABLET BY MOUTH EVERY DAY  30 tablet  11  . loratadine (CLARITIN) 10 MG tablet Take 10 mg by mouth daily.      . norethindrone-ethinyl estradiol (FEMHRT 1/5) 1-5 MG-MCG TABS Take 1 tablet by mouth daily.  28 tablet  11  . pantoprazole (PROTONIX) 40 MG tablet TAKE 1 TABLET EVERY DAY  30 tablet  11  . promethazine (PHENERGAN) 25 MG  tablet Take 25 mg by mouth as needed. When having a migraine      . rizatriptan (MAXALT) 10 MG tablet Take 10 mg by mouth as needed. May repeat in 2 hours if needed       No current facility-administered medications on file prior to visit.      Review of Systems Review of Systems  Constitutional: Negative for fever, appetite change, fatigue and unexpected weight change.  Eyes: Negative for pain and visual disturbance.  Respiratory: Negative for cough and shortness of breath.   Cardiovascular: Negative for cp or palpitations    Gastrointestinal: Negative for nausea, diarrhea and constipation.  Genitourinary: Negative for urgency and pos for mild frequency pos for vaginal discomfort  Skin: Negative for pallor or rash   Neurological: Negative for weakness, light-headedness, numbness and headaches.  Hematological: Negative for adenopathy. Does not bruise/bleed easily.  Psychiatric/Behavioral: Negative for dysphoric mood. The patient is not nervous/anxious.         Objective:   Physical Exam  Constitutional: She appears well-developed and well-nourished. No distress.  Eyes: Conjunctivae and EOM are normal. Pupils are equal, round, and reactive to light. No scleral icterus.  Neck: Normal range of motion. Neck supple.  Cardiovascular: Normal rate and regular rhythm.   Abdominal: Soft. Bowel sounds are normal. She exhibits no distension and no mass. There is no tenderness. There is no rebound, no guarding and no CVA tenderness.  No suprapubic tenderness or fullness    Genitourinary:  Mild swelling of urethra noted  Wet prep obt  Vag d/c is clear and normal appearing  No lesions or excoriations   Pt's urethral area is tender  Neurological: She is alert.  Skin: Skin is warm and dry. No rash noted.  Psychiatric: She has a normal mood and affect.          Assessment & Plan:   Problem List Items Addressed This Visit     Genitourinary   Urethritis     Disc ways to prevent this  -avoidance of baths/ avoidance of soap/ cleanser in perineal area  Drinking water and avoidance of other beverages or acidic products  Pt thinks in retrospect she may have reacted to a scented cleansing  product and she will stop that   ua was borderline today- sent for cx  tx with 3 d of keflex while waiting for cx to return   Enc a good water intake as well     Relevant Orders      POCT urinalysis dipstick (Completed)      Urine culture     Other   Vaginal discomfort - Primary     Wet prep negative today Suspect related to urethritis     Relevant Orders      POCT Wet Prep [facility]) (Completed)      POCT urinalysis dipstick (Completed)      Urine culture

## 2014-08-08 NOTE — Assessment & Plan Note (Signed)
Wet prep negative today Suspect related to urethritis

## 2014-08-08 NOTE — Assessment & Plan Note (Addendum)
Disc ways to prevent this -avoidance of baths/ avoidance of soap/ cleanser in perineal area  Drinking water and avoidance of other beverages or acidic products  Pt thinks in retrospect she may have reacted to a scented cleansing product and she will stop that   ua was borderline today- sent for cx  tx with 3 d of keflex while waiting for cx to return   Enc a good water intake as well

## 2014-08-09 LAB — URINE CULTURE
COLONY COUNT: NO GROWTH
Organism ID, Bacteria: NO GROWTH

## 2014-09-02 ENCOUNTER — Emergency Department: Payer: Self-pay | Admitting: Emergency Medicine

## 2014-09-03 ENCOUNTER — Telehealth: Payer: Self-pay | Admitting: Family Medicine

## 2014-09-03 NOTE — Telephone Encounter (Signed)
appt scheduled for 09/07/14

## 2014-09-03 NOTE — Telephone Encounter (Signed)
Please make her an appt for later this week instead of waiting for next week  I hope she is starting to feel better

## 2014-09-03 NOTE — Telephone Encounter (Signed)
Patient Information:  Caller Name: Justina  Phone: [phone]  Patient: Jennifer [name], Jennifer [name]  Gender: Female  DOB: Aug 25, 1956  Age: 58 Years  PCP: [facility], [facility], [facility])  Office Follow Up:  Does the office need to follow up with this patient?: Yes  Instructions For The Office: Please call and advise if MD feels pt should be seen sooner than next week. She is having minimal pain but afraid it will come back.   Symptoms  Reason For Call & Symptoms: Pt was seen last night in the ED for lumbar sacral pain. She was to f/u from the ED at the office - she is scheduled for next week. But pt wanted to give the symptoms to the triage nurse to see if she needs to be seen before then. Pt started with pain on Friday . Yesterday getting out of bed was excrutiating . She couldn't stand/sit or walk 10/10.  Pt was given an injection of Toradol and Prednisone. Should she be seen sooner than Tuesday afternoon 09/11/14. Pts pain today on steroids is a 1 1/2.  Reviewed Health History In EMR: N/A  Reviewed Medications In EMR: N/A  Reviewed Allergies In EMR: N/A  Reviewed Surgeries / Procedures: N/A  Date of Onset of Symptoms: 09/03/2014  Guideline(s) Used:  Back Pain  Disposition Per Guideline:   Home Care  Reason For Disposition Reached:   Back pain  Advice Given:  N/A  Patient Will Follow Care Advice:  YES

## 2014-09-04 ENCOUNTER — Emergency Department: Payer: Self-pay | Admitting: Emergency Medicine

## 2014-09-04 NOTE — Telephone Encounter (Addendum)
Pt left v/m wanting to update Dr Glori Bickers; pt has appt with Dr Glori Bickers on 09/07/14; pt took oral steroid last night; had severe allergic reaction and pt was seen in ED.(did not put on allergy list due to need name of med and reaction). Pt is not on any antiinflammatory; pts pain level is 2 - 3.

## 2014-09-04 NOTE — Telephone Encounter (Signed)
Thanks for the update -please call for that ER note

## 2014-09-06 NOTE — Telephone Encounter (Signed)
Records requested

## 2014-09-07 ENCOUNTER — Other Ambulatory Visit: Payer: Self-pay

## 2014-09-07 ENCOUNTER — Ambulatory Visit: Payer: BC Managed Care – PPO | Admitting: Family Medicine

## 2014-09-11 ENCOUNTER — Ambulatory Visit: Payer: BC Managed Care – PPO | Admitting: Family Medicine

## 2014-09-25 ENCOUNTER — Telehealth: Payer: Self-pay | Admitting: Family Medicine

## 2014-09-25 DIAGNOSIS — K219 Gastro-esophageal reflux disease without esophagitis: Secondary | ICD-10-CM

## 2014-09-25 DIAGNOSIS — Z0000 Encounter for general adult medical examination without abnormal findings: Secondary | ICD-10-CM

## 2014-09-25 DIAGNOSIS — M8580 Other specified disorders of bone density and structure, unspecified site: Secondary | ICD-10-CM

## 2014-09-25 DIAGNOSIS — Z13220 Encounter for screening for lipoid disorders: Secondary | ICD-10-CM

## 2014-09-25 DIAGNOSIS — E039 Hypothyroidism, unspecified: Secondary | ICD-10-CM

## 2014-09-25 NOTE — Telephone Encounter (Signed)
Can make a 30 min appt for her any time and we can review whatever she wants to review-  I usually review recommended health mt items for a pt of their age and then address or review chronic medical problems   So if she wants to come in earlier then Feb - do that please
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
## 2014-09-26 DIAGNOSIS — Z13220 Encounter for screening for lipoid disorders: Secondary | ICD-10-CM | POA: Insufficient documentation

## 2014-09-26 DIAGNOSIS — E785 Hyperlipidemia, unspecified: Secondary | ICD-10-CM | POA: Insufficient documentation

## 2014-09-26 DIAGNOSIS — Z0000 Encounter for general adult medical examination without abnormal findings: Secondary | ICD-10-CM | POA: Insufficient documentation

## 2014-09-26 NOTE — Telephone Encounter (Signed)
Pt did want to be seen earlier, she just wasn't aware that she had already had her CPE this year. Pt scheduled a 59min f/u with Dr. Glori Bickers on 10/01/14 and a lab appt for this Friday 09/28/14, pt would like to have her thyroid, and cholesterol level checked and any other labs that you usually check, please put in lab order.

## 2014-09-26 NOTE — Telephone Encounter (Signed)
Labs ordered.

## 2014-09-26 NOTE — Addendum Note (Signed)
Addended by: Loura Pardon A on: 09/26/2014 07:03 PM   Modules accepted: Orders

## 2014-09-28 ENCOUNTER — Telehealth: Payer: Self-pay | Admitting: Family Medicine

## 2014-09-28 ENCOUNTER — Other Ambulatory Visit (INDEPENDENT_AMBULATORY_CARE_PROVIDER_SITE_OTHER): Payer: BC Managed Care – PPO

## 2014-09-28 ENCOUNTER — Emergency Department: Payer: Self-pay | Admitting: Internal Medicine

## 2014-09-28 DIAGNOSIS — K219 Gastro-esophageal reflux disease without esophagitis: Secondary | ICD-10-CM

## 2014-09-28 DIAGNOSIS — Z0000 Encounter for general adult medical examination without abnormal findings: Secondary | ICD-10-CM

## 2014-09-28 DIAGNOSIS — E039 Hypothyroidism, unspecified: Secondary | ICD-10-CM

## 2014-09-28 DIAGNOSIS — Z13220 Encounter for screening for lipoid disorders: Secondary | ICD-10-CM

## 2014-09-28 DIAGNOSIS — M8580 Other specified disorders of bone density and structure, unspecified site: Secondary | ICD-10-CM

## 2014-09-28 LAB — COMPREHENSIVE METABOLIC PANEL
ALK PHOS: 50 U/L (ref 39–117)
ALT: 18 U/L (ref 0–35)
AST: 16 U/L (ref 0–37)
Albumin: 3.5 g/dL (ref 3.5–5.2)
BILIRUBIN TOTAL: 0.8 mg/dL (ref 0.2–1.2)
BUN: 15 mg/dL (ref 6–23)
CO2: 24 mEq/L (ref 19–32)
CREATININE: 0.8 mg/dL (ref 0.4–1.2)
Calcium: 9.2 mg/dL (ref 8.4–10.5)
Chloride: 103 mEq/L (ref 96–112)
GFR: 80.63 mL/min (ref >60.00)
Glucose, Bld: 97 mg/dL (ref 70–99)
Potassium: 3.9 mEq/L (ref 3.5–5.1)
Sodium: 138 mEq/L (ref 135–145)
Total Protein: 7.1 g/dL (ref 6.0–8.3)

## 2014-09-28 LAB — CBC WITH DIFFERENTIAL/PLATELET
BASOS ABS: 0 10*3/uL (ref 0.0–0.1)
Basophils Relative: 0.6 % (ref 0.0–3.0)
EOS ABS: 0.1 10*3/uL (ref 0.0–0.7)
Eosinophils Relative: 1.7 % (ref 0.0–5.0)
HEMATOCRIT: 39.9 % (ref 36.0–46.0)
HEMOGLOBIN: 13.5 g/dL (ref 12.0–15.0)
LYMPHS ABS: 1.9 10*3/uL (ref 0.7–4.0)
LYMPHS PCT: 27.2 % (ref 12.0–46.0)
MCHC: 33.9 g/dL (ref 30.0–36.0)
MCV: 94 fl (ref 78.0–100.0)
Monocytes Absolute: 0.7 10*3/uL (ref 0.1–1.0)
Monocytes Relative: 10.6 % (ref 3.0–12.0)
Neutro Abs: 4.1 10*3/uL (ref 1.4–7.7)
Neutrophils Relative %: 59.9 % (ref 43.0–77.0)
Platelets: 272 10*3/uL (ref 150.0–400.0)
RBC: 4.25 Mil/uL (ref 3.87–5.11)
RDW: 12.4 % (ref 11.5–15.5)
WBC: 6.9 10*3/uL (ref 4.0–10.5)

## 2014-09-28 LAB — LIPID PANEL
CHOL/HDL RATIO: 3
Cholesterol: 187 mg/dL (ref 0–200)
HDL: 55.5 mg/dL (ref >39.00)
LDL Cholesterol: 117 mg/dL — ABNORMAL HIGH (ref 0–99)
NonHDL: 131.5
Triglycerides: 75 mg/dL (ref 0.0–149.0)
VLDL: 15 mg/dL (ref 0.0–40.0)

## 2014-09-28 LAB — VITAMIN D 25 HYDROXY (VIT D DEFICIENCY, FRACTURES): VITD: 30.07 ng/mL (ref 30.00–100.00)

## 2014-09-28 LAB — TSH: TSH: 4.92 u[IU]/mL — ABNORMAL HIGH (ref 0.35–4.50)

## 2014-09-28 NOTE — Telephone Encounter (Signed)
Patient Information:  Caller Name: Courtenay  Phone: [phone]  Patient: Jennifer [name], Jennifer [name]  Gender: Female  DOB: [birth year]  Age: 58 Years  PCP: [facility], [facility], [facility])  Office Follow Up:  Does the office need to follow up with this patient?: No  Instructions For The Office: N/A  RN Note:  Since office is closing for the day, advised her to go to ED.  Symptoms  Reason For Call & Symptoms: Calling about severe low back pain--no injury. Had lumbar strain about 3 weeks ago--> was seen in ED, given steroids and pain med. Is driving herself to the ED now. Has an orthopedic surgeon but she called them and they aren't able to help her today.  Reviewed Health History In EMR: Yes  Reviewed Medications In EMR: Yes  Reviewed Allergies In EMR: Yes  Reviewed Surgeries / Procedures: Yes  Date of Onset of Symptoms: 09/27/2014  Guideline(s) Used:  Back Pain  Disposition Per Guideline:   Go to Office Now  Reason For Disposition Reached:   Severe back pain  Advice Given:  N/A  Patient Will Follow Care Advice:  YES

## 2014-09-28 NOTE — Telephone Encounter (Signed)
Pt called in to state that she was driving herself to [facility] ER due to back pain. She asked to speak with Dr Glori Bickers to see if she agreed that is what she should do but I advised that she was in with a patient and transferred her to [facility].

## 2014-09-29 NOTE — Telephone Encounter (Signed)
I agree with going to ER

## 2014-10-01 ENCOUNTER — Ambulatory Visit (INDEPENDENT_AMBULATORY_CARE_PROVIDER_SITE_OTHER): Payer: BC Managed Care – PPO | Admitting: Family Medicine

## 2014-10-01 ENCOUNTER — Encounter: Payer: Self-pay | Admitting: Radiology

## 2014-10-01 ENCOUNTER — Other Ambulatory Visit: Payer: Self-pay

## 2014-10-01 ENCOUNTER — Encounter: Payer: Self-pay | Admitting: Family Medicine

## 2014-10-01 VITALS — BP 106/70 | HR 66 | Temp 98.2°F | Ht 61.0 in | Wt 120.2 lb

## 2014-10-01 DIAGNOSIS — Z1231 Encounter for screening mammogram for malignant neoplasm of breast: Secondary | ICD-10-CM

## 2014-10-01 DIAGNOSIS — E039 Hypothyroidism, unspecified: Secondary | ICD-10-CM

## 2014-10-01 DIAGNOSIS — Z13220 Encounter for screening for lipoid disorders: Secondary | ICD-10-CM

## 2014-10-01 DIAGNOSIS — M8580 Other specified disorders of bone density and structure, unspecified site: Secondary | ICD-10-CM

## 2014-10-01 MED ORDER — LEVOTHYROXINE SODIUM 75 MCG PO TABS: 75.0000 ug | ORAL_TABLET | Freq: Every day | ORAL | Status: DC

## 2014-10-01 NOTE — Progress Notes (Signed)
Pre visit review using our clinic review tool, if applicable. No additional management support is needed unless otherwise documented below in the visit note. 

## 2014-10-01 NOTE — Assessment & Plan Note (Signed)
Lab Results  Component Value Date   TSH 4.92* 09/28/2014  pt has suffered from fatigue  Will inc levothyroxine to 75 mcg from 50  Re check tsh in 6 weeks

## 2014-10-01 NOTE — Assessment & Plan Note (Signed)
Will add 2000 iu vit D daily for bone health Gyn will order her next 2 y f/u dexa Disc need for calcium/ vitamin D/ wt bearing exercise and bone density test every 2 y to monitor Disc safety/ fracture risk in detail

## 2014-10-01 NOTE — Progress Notes (Signed)
Subjective:    Patient ID: Jennifer [name], female    DOB: [birth year], 58 y.o.   MRN: [id number]  HPI Here for f/u of chronic medical problems   Ended up in the ER with lumbosacral pain - again last week  Not getting anywhere with current ortho  Cannot predict when flare ups are going to come up  -- wondering if she is going to need an epidural  Plans to go back for another visit  Usually sees Dr Ellard Artis   Has been very busy  Working long hours and traveling for work   [employer]  up 4 lb with bmi of 82  Hypothyroid Lab Results  Component Value Date   TSH 4.92* 09/28/2014   This is up  Takes levothyroxine 50 mcg daily  She has been tired   Lipoid screen Lab Results  Component Value Date   CHOL 187 09/28/2014   CHOL 168 01/10/2014   CHOL 188 10/28/2012   Lab Results  Component Value Date   HDL 55.50 09/28/2014   HDL 56.30 01/10/2014   HDL 64.00 10/28/2012   Lab Results  Component Value Date   LDLCALC 117* 09/28/2014   [place] 108* 10/28/2012   [place] 106* 08/21/2009   Lab Results  Component Value Date   TRIG 75.0 09/28/2014   TRIG 261.0* 01/10/2014   TRIG 82.0 10/28/2012   Lab Results  Component Value Date   CHOLHDL 3 09/28/2014   CHOLHDL 3 01/10/2014   CHOLHDL 3 10/28/2012   Lab Results  Component Value Date   LDLDIRECT 98.6 01/10/2014   LDLDIRECT 121.8 09/16/2010   risk ratio remains good   Her glucose is 97 -she is upset about it - worried about prediabetes  Has a strong family hx of DM -wants to watch that  She will continue to watch diet   Osteopenia hx  D level is 30  Is not dilligent about her ca and D  She will plan to take that and add more D   Sees Dr Phineas Real next mo for her gyn /breast exam  He orders her bone density test   She takes xanax only when she has trouble sleeping  Had last dose 1/4 pill several days ago   Patient Active Problem List   Diagnosis Date Noted  . Screening for lipoid disorders 09/26/2014  . Preventative  health care 09/26/2014  . Vaginal discomfort 08/07/2014  . Urethritis 08/07/2014  . Dysuria 08/07/2014  . Neck pain on left side 04/18/2014  . Left shoulder pain 04/18/2014  . Routine general medical examination at a health care facility 10/28/2012  . Recurrent cold sores 04/28/2011  . GERD 09/16/2010  . Hypothyroidism 05/07/2008  . Osteopenia 05/07/2008  . MIGRAINE WITH AURA 11/11/2007   Past Medical History  Diagnosis Date  . Esophageal reflux   . Migraine with aura, without mention of intractable migraine without mention of status migrainosus   . Allergic rhinitis   . Osteopenia 10/2012    T score -2.1 FRAX 8%/1.1%  . Endometriosis   . Cervical dysplasia   . Hypothyroid    Past Surgical History  Procedure Laterality Date  . Rotator cuff repair  08/2008  . Colonoscopy  2005    normal  . Cervical cone biopsy  1985    severe dysplasia  . Breast surgery      Benign breast lump excised  . Colposcopy     History  Substance Use Topics  . Smoking status: Never Smoker   .  Smokeless tobacco: Not on file  . Alcohol Use: No   Family History  Problem Relation Age of Onset  . Breast cancer Mother 6  . Diabetes Mother   . Hypertension Father   . Heart disease Father   . Osteoporosis Maternal Grandmother   . Diabetes Maternal Grandmother    Allergies  Allergen Reactions  . Codeine Phosphate Other (See Comments)    Dizziness, heart palpitations, nervous  . Hydrocodone     REACTION: made nervous--10/09 surgery rotator cuff   Current Outpatient Prescriptions on File Prior to Visit  Medication Sig Dispense Refill  . ALPRAZolam (XANAX) 0.5 MG tablet Take 0.5-1 tablets (0.25-0.5 mg total) by mouth as needed for sleep or anxiety. For travel anxiety 15 tablet 0  . atenolol (TENORMIN) 25 MG tablet Take 12.5 mg by mouth 3 (three) times daily.     . baclofen (LIORESAL) 10 MG tablet Take 10 mg by mouth as needed. For migraine prevention     . fish oil-omega-3 fatty acids 1000 MG  capsule Take 1 g by mouth daily.      . fluticasone (FLONASE) 50 MCG/ACT nasal spray USE 2 SPRAYS INTO EACH NOSTRIL DAILY 16 g 4  . ketorolac (TORADOL) 10 MG tablet Take 10 mg by mouth as needed. With food for Migraine    . levothyroxine (SYNTHROID, LEVOTHROID) 50 MCG tablet TAKE 1 TABLET BY MOUTH EVERY DAY 30 tablet 11  . loratadine (CLARITIN) 10 MG tablet Take 10 mg by mouth daily.    . nabumetone (RELAFEN) 500 MG tablet Take 0.5 tablets by mouth daily as needed.    . norethindrone-ethinyl estradiol (FEMHRT 1/5) 1-5 MG-MCG TABS Take 1 tablet by mouth daily. 28 tablet 11  . pantoprazole (PROTONIX) 40 MG tablet TAKE 1 TABLET EVERY DAY 30 tablet 11  . promethazine (PHENERGAN) 25 MG tablet Take 25 mg by mouth as needed. When having a migraine    . rizatriptan (MAXALT) 10 MG tablet Take 10 mg by mouth as needed. May repeat in 2 hours if needed     No current facility-administered medications on file prior to visit.     Review of Systems    Review of Systems  Constitutional: Negative for fever, appetite change,  and unexpected weight change. pos for fatigue Eyes: Negative for pain and visual disturbance.  Respiratory: Negative for cough and shortness of breath.   Cardiovascular: Negative for cp or palpitations    Gastrointestinal: Negative for nausea, diarrhea and constipation.  Genitourinary: Negative for urgency and frequency.  Skin: Negative for pallor or rash  neg for skin or hair changes  Neurological: Negative for weakness, light-headedness, numbness and headaches.  Hematological: Negative for adenopathy. Does not bruise/bleed easily.  Psychiatric/Behavioral: pos for dysphoric mood. The patient is not nervous/anxious.  pos for significant stressors     Objective:   Physical Exam  Constitutional: She appears well-developed and well-nourished. No distress.  HENT:  Head: Normocephalic and atraumatic.  Mouth/Throat: Oropharynx is clear and moist.  Eyes: Conjunctivae and EOM are  normal. Pupils are equal, round, and reactive to light. No scleral icterus.  Neck: Normal range of motion. Neck supple. No JVD present. Carotid bruit is not present. No thyromegaly present.  Cardiovascular: Normal rate, regular rhythm and intact distal pulses.  Exam reveals no gallop.   Pulmonary/Chest: Effort normal and breath sounds normal. No respiratory distress. She has no wheezes. She has no rales.  Abdominal: Soft. Bowel sounds are normal. She exhibits no distension and no mass. There  is no tenderness.  Musculoskeletal: She exhibits no edema.  No acute joint changes   Lymphadenopathy:    She has no cervical adenopathy.  Neurological: She is alert. She has normal reflexes. No cranial nerve deficit. She exhibits normal muscle tone. Coordination normal.  Skin: Skin is warm and dry. No rash noted. No pallor.  Psychiatric: She has a normal mood and affect.  Pt seems generally stressed and fatigued           Assessment & Plan:   Problem List Items Addressed This Visit      Endocrine   Hypothyroidism - Primary    Lab Results  Component Value Date   TSH 4.92* 09/28/2014  pt has suffered from fatigue  Will inc levothyroxine to 75 mcg from 50  Re check tsh in 6 weeks     Relevant Medications      levothyroxine (SYNTHROID, LEVOTHROID) tablet     Musculoskeletal and Integument   Osteopenia    Will add 2000 iu vit D daily for bone health Gyn will order her next 2 y f/u dexa Disc need for calcium/ vitamin D/ wt bearing exercise and bone density test every 2 y to monitor Disc safety/ fracture risk in detail        Other   Screening for lipoid disorders    Disc goals for lipids and reasons to control them Rev labs with pt Rev low sat fat diet in detail Good cholesterol control with diet

## 2014-10-01 NOTE — Patient Instructions (Signed)
Increase your thyroid medicine from 50 mcg to 75 mcg once daily  Schedule non fasting labs for 6 weeks (will do tsh)  Try to take care of yourself Add another 1000 iu vitamin D daily (and continue your usual calcium plus D)

## 2014-10-01 NOTE — Assessment & Plan Note (Signed)
Disc goals for lipids and reasons to control them Rev labs with pt Rev low sat fat diet in detail Good cholesterol control with diet

## 2014-10-03 ENCOUNTER — Telehealth: Payer: Self-pay | Admitting: Family Medicine

## 2014-10-03 NOTE — Telephone Encounter (Signed)
Patient Information:  Caller Name: Sahiba  Phone: [phone]  Patient: Jennifer [name], Jennifer [name]  Gender: Female  DOB: 1956/11/19  Age: 58 Years  PCP: Tower, [place] ([facility])  Office Follow Up:  Does the office need to follow up with this patient?: Yes  Instructions For The Office: Pt instru cted to hold Levothyroxine this AM 10/03/14 until she hears from Dr.   She asks does she just need to continue this dose, cut it in half or go back to her old dose, or come back in to be seen.  Please call pt and advise her what to do. TY!   Symptoms  Reason For Call & Symptoms: Pt's Levothyroxine increased from 50 mcg tyo 75 mg and 10/02/14 was her first dose and she had heart palpitations in the middle of the night 10/02/14.  She had them until 0830 this AM 10/03/14.   Should she continue this dose?  Also took Relafin 500 mgfor back pain.  Reviewed Health History In EMR: Yes  Reviewed Medications In EMR: Yes  Reviewed Allergies In EMR: Yes  Reviewed Surgeries / Procedures: Yes  Date of Onset of Symptoms: 10/02/2014  Guideline(s) Used:  Heart Rate and Heartbeat Questions  Disposition Per Guideline:   See Within 3 Days in Office  Reason For Disposition Reached:   History of hyperthyroidism or taking thyroid medication  Advice Given:  Avoid Caffeine  Avoid caffeine-containing beverages (Reason: caffeine is a stimulant and can aggravate palpitations).  Examples of caffeine-containing beverages include coffee, tea, colas, Mountain Dew, [employer], and some energy drinks.  Expected Course  : If your symptoms do not improve over the next couple days then you should make an appointment to see your doctor.  Call Back If:  Chest pain, lightheadedness, or difficulty breathing occurs  Heart beating more than 130 beats / minute  More than 3 extra or skipped beats / minute  You become worse.  Patient Will Follow Care Advice:  YES

## 2014-10-04 NOTE — Telephone Encounter (Signed)
Pt request call sent to Dr Glori Bickers and not another provider; pt took levothyroxine 50 mcg this morning. Pt said heart palpitations that started on 10/02/14 stopped completely on 10/03/14 at 10 AM. Pt having no problem this morning. Pt is flying out of town on 10/05/14 and will return home on 10/10/14. Pt is taking her Levothyroxine 50 mcg and 75 mcg with her; pt said she does not need any med called to pharmacy but wants to know how to take levothyroxine. Pt request cb at [phone].Please advise.

## 2014-10-04 NOTE — Telephone Encounter (Signed)
If she is certain that the 75 mcg caused palpitations then go back to the 50 mcg and stay on that - call in if needed  Then check tsh/ free t4 in about a month

## 2014-10-04 NOTE — Telephone Encounter (Signed)
Left voicemail letting pt know Dr. Marliss Coots comments/recommendation and to go back to 22mcg of levothyroxine and to call office back and schedule a lab appt in 1 month

## 2014-10-11 ENCOUNTER — Encounter: Payer: Self-pay | Admitting: Family Medicine

## 2014-10-15 ENCOUNTER — Ambulatory Visit (INDEPENDENT_AMBULATORY_CARE_PROVIDER_SITE_OTHER): Payer: BC Managed Care – PPO | Admitting: Family Medicine

## 2014-10-15 ENCOUNTER — Encounter: Payer: Self-pay | Admitting: Family Medicine

## 2014-10-15 VITALS — BP 118/78 | HR 67 | Temp 99.0°F | Ht 61.0 in | Wt 118.5 lb

## 2014-10-15 DIAGNOSIS — B9789 Other viral agents as the cause of diseases classified elsewhere: Principal | ICD-10-CM

## 2014-10-15 DIAGNOSIS — J069 Acute upper respiratory infection, unspecified: Secondary | ICD-10-CM

## 2014-10-15 MED ORDER — AZITHROMYCIN 250 MG PO TABS: ORAL_TABLET | ORAL | Status: DC

## 2014-10-15 NOTE — Patient Instructions (Addendum)
Drink lots of fluids and take guifenesin You can start the zithromax now or wait a few days and see if symptoms do not improve (I sent it to the pharmacy)  Update if not starting to improve in a week or if worsening

## 2014-10-15 NOTE — Assessment & Plan Note (Signed)
Pt is worried she may be developing a bacterial secondary lung infx due to inc prod cough  May just be the tail end of the virus  Overall improved in terms of sinusitis  Recommend fluids and rest  guifenisen prn  Also px zpak- she can see how she does and fill in 2 d if still not improving  Update if not starting to improve in a week or if worsening

## 2014-10-15 NOTE — Progress Notes (Signed)
Pre visit review using our clinic review tool, if applicable. No additional management support is needed unless otherwise documented below in the visit note. 

## 2014-10-15 NOTE — Progress Notes (Signed)
Subjective:    Patient ID: Jennifer [name], female    DOB: [birth year], 58 y.o.   MRN: [id number]  HPI Here with uri symptoms - ? Poss sinus inf Started getting sick Monday night -went to [facility] out of town - given 6 day methylprednisolone pack It really helps  Dx with sinusitis  She is finishing that now   In am - blows nose -scantly yellow -getting better  Yesterday and today - cough was worse and prod of green sputum   Temp of 99 right now Has not felt feverish   No facial pain   Using her nasal irrigations  guifenesin prn  Cough drops   Patient Active Problem List   Diagnosis Date Noted  . Screening for lipoid disorders 09/26/2014  . Preventative health care 09/26/2014  . Vaginal discomfort 08/07/2014  . Urethritis 08/07/2014  . Dysuria 08/07/2014  . Neck pain on left side 04/18/2014  . Left shoulder pain 04/18/2014  . Routine general medical examination at a health care facility 10/28/2012  . Recurrent cold sores 04/28/2011  . GERD 09/16/2010  . Hypothyroidism 05/07/2008  . Osteopenia 05/07/2008  . MIGRAINE WITH AURA 11/11/2007   Past Medical History  Diagnosis Date  . Esophageal reflux   . Migraine with aura, without mention of intractable migraine without mention of status migrainosus   . Allergic rhinitis   . Osteopenia 10/2012    T score -2.1 FRAX 8%/1.1%  . Endometriosis   . Cervical dysplasia   . Hypothyroid    Past Surgical History  Procedure Laterality Date  . Rotator cuff repair  08/2008  . Colonoscopy  2005    normal  . Cervical cone biopsy  1985    severe dysplasia  . Breast surgery      Benign breast lump excised  . Colposcopy     History  Substance Use Topics  . Smoking status: Never Smoker   . Smokeless tobacco: Not on file  . Alcohol Use: No   Family History  Problem Relation Age of Onset  . Breast cancer Mother 80  . Diabetes Mother   . Hypertension Father   . Heart disease Father   . Osteoporosis Maternal Grandmother   . Diabetes  Maternal Grandmother    Allergies  Allergen Reactions  . Codeine Phosphate Other (See Comments)    Dizziness, heart palpitations, nervous  . Hydrocodone     REACTION: made nervous--10/09 surgery rotator cuff   Current Outpatient Prescriptions on File Prior to Visit  Medication Sig Dispense Refill  . ALPRAZolam (XANAX) 0.5 MG tablet Take 0.5-1 tablets (0.25-0.5 mg total) by mouth as needed for sleep or anxiety. For travel anxiety 15 tablet 0  . atenolol (TENORMIN) 25 MG tablet Take 12.5 mg by mouth 3 (three) times daily.     . baclofen (LIORESAL) 10 MG tablet Take 10 mg by mouth as needed. For migraine prevention     . fish oil-omega-3 fatty acids 1000 MG capsule Take 1 g by mouth daily.      . fluticasone (FLONASE) 50 MCG/ACT nasal spray USE 2 SPRAYS INTO EACH NOSTRIL DAILY 16 g 4  . ketorolac (TORADOL) 10 MG tablet Take 10 mg by mouth as needed. With food for Migraine    . levothyroxine (SYNTHROID, LEVOTHROID) 75 MCG tablet Take 1 tablet (75 mcg total) by mouth daily. 30 tablet 3  . loratadine (CLARITIN) 10 MG tablet Take 10 mg by mouth daily.    . nabumetone (RELAFEN) 500 MG tablet Take  0.5 tablets by mouth daily as needed.    . norethindrone-ethinyl estradiol (FEMHRT 1/5) 1-5 MG-MCG TABS Take 1 tablet by mouth daily. 28 tablet 11  . pantoprazole (PROTONIX) 40 MG tablet TAKE 1 TABLET EVERY DAY 30 tablet 11  . promethazine (PHENERGAN) 25 MG tablet Take 25 mg by mouth as needed. When having a migraine    . rizatriptan (MAXALT) 10 MG tablet Take 10 mg by mouth as needed. May repeat in 2 hours if needed     No current facility-administered medications on file prior to visit.     Review of Systems Review of Systems  Constitutional: Negative for , appetite change,  and unexpected weight change. pos for fatigue  ENT pos for cong and rhinorrhea , neg for sinus pain  Eyes: Negative for pain and visual disturbance.  Respiratory: Negative for wheeze  and shortness of breath.     Cardiovascular: Negative for cp or palpitations    Gastrointestinal: Negative for nausea, diarrhea and constipation.  Genitourinary: Negative for urgency and frequency.  Skin: Negative for pallor or rash   Neurological: Negative for weakness, light-headedness, numbness and headaches.  Hematological: Negative for adenopathy. Does not bruise/bleed easily.  Psychiatric/Behavioral: Negative for dysphoric mood. The patient is not nervous/anxious.         Objective:   Physical Exam  Constitutional: She appears well-developed and well-nourished. No distress.  HENT:  Head: Normocephalic and atraumatic.  Right Ear: External ear normal.  Left Ear: External ear normal.  Mouth/Throat: Oropharynx is clear and moist. No oropharyngeal exudate.  Nares are injected and congested   Mild maxillary sinus tenderness Throat clear  Clear rhinorrhea   Eyes: Conjunctivae and EOM are normal. Pupils are equal, round, and reactive to light. Right eye exhibits no discharge. Left eye exhibits no discharge.  Neck: Normal range of motion. Neck supple.  Cardiovascular: Normal rate and regular rhythm.   Pulmonary/Chest: Effort normal and breath sounds normal. No respiratory distress. She has no wheezes. She has no rales.  No rales or rhonchi  Lymphadenopathy:    She has no cervical adenopathy.  Neurological: She is alert.  Skin: Skin is warm and dry. No rash noted. No erythema. No pallor.  Psychiatric: She has a normal mood and affect.          Assessment & Plan:   Problem List Items Addressed This Visit      Respiratory   Viral URI with cough - Primary    Pt is worried she may be developing a bacterial secondary lung infx due to inc prod cough  May just be the tail end of the virus  Overall improved in terms of sinusitis  Recommend fluids and rest  guifenisen prn  Also px zpak- she can see how she does and fill in 2 d if still not improving  Update if not starting to improve in a week or if  worsening      Relevant Medications      azithromycin (ZITHROMAX) tablet

## 2014-10-23 DIAGNOSIS — M8580 Other specified disorders of bone density and structure, unspecified site: Secondary | ICD-10-CM

## 2014-10-23 HISTORY — DX: Other specified disorders of bone density and structure, unspecified site: M85.80

## 2014-10-29 ENCOUNTER — Ambulatory Visit (INDEPENDENT_AMBULATORY_CARE_PROVIDER_SITE_OTHER): Payer: BC Managed Care – PPO | Admitting: Gynecology

## 2014-10-29 ENCOUNTER — Encounter: Payer: Self-pay | Admitting: Gynecology

## 2014-10-29 ENCOUNTER — Telehealth: Payer: Self-pay | Admitting: Family Medicine

## 2014-10-29 VITALS — BP 112/66 | Ht 61.0 in | Wt 118.0 lb

## 2014-10-29 DIAGNOSIS — N898 Other specified noninflammatory disorders of vagina: Secondary | ICD-10-CM

## 2014-10-29 DIAGNOSIS — M8580 Other specified disorders of bone density and structure, unspecified site: Secondary | ICD-10-CM

## 2014-10-29 DIAGNOSIS — Z01419 Encounter for gynecological examination (general) (routine) without abnormal findings: Secondary | ICD-10-CM

## 2014-10-29 DIAGNOSIS — N9489 Other specified conditions associated with female genital organs and menstrual cycle: Secondary | ICD-10-CM

## 2014-10-29 LAB — WET PREP FOR TRICH, YEAST, CLUE
Clue Cells Wet Prep HPF POC: NONE SEEN
TRICH WET PREP: NONE SEEN

## 2014-10-29 MED ORDER — FLUCONAZOLE 150 MG PO TABS: 150.0000 mg | ORAL_TABLET | Freq: Once | ORAL | Status: DC

## 2014-10-29 NOTE — Patient Instructions (Signed)
You may obtain a copy of any labs that were done today by logging onto MyChart as outlined in the instructions provided with your AVS (after visit summary). The office will not call with normal lab results but certainly if there are any significant abnormalities then we will contact you.   Health Maintenance, Female A healthy lifestyle and preventative care can promote health and wellness.  Maintain regular health, dental, and eye exams.  Eat a healthy diet. Foods like vegetables, fruits, whole grains, low-fat dairy products, and lean protein foods contain the nutrients you need without too many calories. Decrease your intake of foods high in solid fats, added sugars, and salt. Get information about a proper diet from your caregiver, if necessary.  Regular physical exercise is one of the most important things you can do for your health. Most adults should get at least 150 minutes of moderate-intensity exercise (any activity that increases your heart rate and causes you to sweat) each week. In addition, most adults need muscle-strengthening exercises on 2 or more days a week.   Maintain a healthy weight. The body mass index (BMI) is a screening tool to identify possible weight problems. It provides an estimate of body fat based on height and weight. Your caregiver can help determine your BMI, and can help you achieve or maintain a healthy weight. For adults 20 years and older:  A BMI below 18.5 is considered underweight.  A BMI of 18.5 to 24.9 is normal.  A BMI of 25 to 29.9 is considered overweight.  A BMI of 30 and above is considered obese.  Maintain normal blood lipids and cholesterol by exercising and minimizing your intake of saturated fat. Eat a balanced diet with plenty of fruits and vegetables. Blood tests for lipids and cholesterol should begin at age 61 and be repeated every 5 years. If your lipid or cholesterol levels are high, you are over 50, or you are a high risk for heart  disease, you may need your cholesterol levels checked more frequently.Ongoing high lipid and cholesterol levels should be treated with medicines if diet and exercise are not effective.  If you smoke, find out from your caregiver how to quit. If you do not use tobacco, do not start.  Lung cancer screening is recommended for adults aged 33 80 years who are at high risk for developing lung cancer because of a history of smoking. Yearly low-dose computed tomography (CT) is recommended for people who have at least a 30-pack-year history of smoking and are a current smoker or have quit within the past 15 years. A pack year of smoking is smoking an average of 1 pack of cigarettes a day for 1 year (for example: 1 pack a day for 30 years or 2 packs a day for 15 years). Yearly screening should continue until the smoker has stopped smoking for at least 15 years. Yearly screening should also be stopped for people who develop a health problem that would prevent them from having lung cancer treatment.  If you are pregnant, do not drink alcohol. If you are breastfeeding, be very cautious about drinking alcohol. If you are not pregnant and choose to drink alcohol, do not exceed 1 drink per day. One drink is considered to be 12 ounces (355 mL) of beer, 5 ounces (148 mL) of wine, or 1.5 ounces (44 mL) of liquor.  Avoid use of street drugs. Do not share needles with anyone. Ask for help if you need support or instructions about stopping  the use of drugs.  High blood pressure causes heart disease and increases the risk of stroke. Blood pressure should be checked at least every 1 to 2 years. Ongoing high blood pressure should be treated with medicines, if weight loss and exercise are not effective.  If you are 59 to 58 years old, ask your caregiver if you should take aspirin to prevent strokes.  Diabetes screening involves taking a blood sample to check your fasting blood sugar level. This should be done once every 3  years, after age 91, if you are within normal weight and without risk factors for diabetes. Testing should be considered at a younger age or be carried out more frequently if you are overweight and have at least 1 risk factor for diabetes.  Breast cancer screening is essential preventative care for women. You should practice "breast self-awareness." This means understanding the normal appearance and feel of your breasts and may include breast self-examination. Any changes detected, no matter how small, should be reported to a caregiver. Women in their 66s and 30s should have a clinical breast exam (CBE) by a caregiver as part of a regular health exam every 1 to 3 years. After age 101, women should have a CBE every year. Starting at age 100, women should consider having a mammogram (breast X-ray) every year. Women who have a family history of breast cancer should talk to their caregiver about genetic screening. Women at a high risk of breast cancer should talk to their caregiver about having an MRI and a mammogram every year.  Breast cancer gene (BRCA)-related cancer risk assessment is recommended for women who have family members with BRCA-related cancers. BRCA-related cancers include breast, ovarian, tubal, and peritoneal cancers. Having family members with these cancers may be associated with an increased risk for harmful changes (mutations) in the breast cancer genes BRCA1 and BRCA2. Results of the assessment will determine the need for genetic counseling and BRCA1 and BRCA2 testing.  The Pap test is a screening test for cervical cancer. Women should have a Pap test starting at age 57. Between ages 25 and 35, Pap tests should be repeated every 2 years. Beginning at age 37, you should have a Pap test every 3 years as long as the past 3 Pap tests have been normal. If you had a hysterectomy for a problem that was not cancer or a condition that could lead to cancer, then you no longer need Pap tests. If you are  between ages 50 and 76, and you have had normal Pap tests going back 10 years, you no longer need Pap tests. If you have had past treatment for cervical cancer or a condition that could lead to cancer, you need Pap tests and screening for cancer for at least 20 years after your treatment. If Pap tests have been discontinued, risk factors (such as a new sexual partner) need to be reassessed to determine if screening should be resumed. Some women have medical problems that increase the chance of getting cervical cancer. In these cases, your caregiver may recommend more frequent screening and Pap tests.  The human papillomavirus (HPV) test is an additional test that may be used for cervical cancer screening. The HPV test looks for the virus that can cause the cell changes on the cervix. The cells collected during the Pap test can be tested for HPV. The HPV test could be used to screen women aged 44 years and older, and should be used in women of any age  who have unclear Pap test results. After the age of 55, women should have HPV testing at the same frequency as a Pap test.  Colorectal cancer can be detected and often prevented. Most routine colorectal cancer screening begins at the age of 44 and continues through age 20. However, your caregiver may recommend screening at an earlier age if you have risk factors for colon cancer. On a yearly basis, your caregiver may provide home test kits to check for hidden blood in the stool. Use of a small camera at the end of a tube, to directly examine the colon (sigmoidoscopy or colonoscopy), can detect the earliest forms of colorectal cancer. Talk to your caregiver about this at age 86, when routine screening begins. Direct examination of the colon should be repeated every 5 to 10 years through age 13, unless early forms of pre-cancerous polyps or small growths are found.  Hepatitis C blood testing is recommended for all people born from [birth year] through 1965 and any  individual with known risks for hepatitis C.  Practice safe sex. Use condoms and avoid high-risk sexual practices to reduce the spread of sexually transmitted infections (STIs). Sexually active women aged 36 and younger should be checked for Chlamydia, which is a common sexually transmitted infection. Older women with new or multiple partners should also be tested for Chlamydia. Testing for other STIs is recommended if you are sexually active and at increased risk.  Osteoporosis is a disease in which the bones lose minerals and strength with aging. This can result in serious bone fractures. The risk of osteoporosis can be identified using a bone density scan. Women ages 20 and over and women at risk for fractures or osteoporosis should discuss screening with their caregivers. Ask your caregiver whether you should be taking a calcium supplement or vitamin D to reduce the rate of osteoporosis.  Menopause can be associated with physical symptoms and risks. Hormone replacement therapy is available to decrease symptoms and risks. You should talk to your caregiver about whether hormone replacement therapy is right for you.  Use sunscreen. Apply sunscreen liberally and repeatedly throughout the day. You should seek shade when your shadow is shorter than you. Protect yourself by wearing long sleeves, pants, a wide-brimmed hat, and sunglasses year round, whenever you are outdoors.  Notify your caregiver of new moles or changes in moles, especially if there is a change in shape or color. Also notify your caregiver if a mole is larger than the size of a pencil eraser.  Stay current with your immunizations. Document Released: 05/25/2011 Document Revised: 03/06/2013 Document Reviewed: 05/25/2011 [facility] Patient Information 2014 [employer].

## 2014-10-29 NOTE — Progress Notes (Signed)
Jennifer [name] [birth year] [id number]        58 y.o.  G0P0 for annual exam.  Several issues noted below.  Past medical history,surgical history, problem list, medications, allergies, family history and social history were all reviewed and documented as reviewed in the EPIC chart.  ROS:  12 system ROS performed with pertinent positives and negatives included in the history, assessment and plan.   Additional significant findings :  Vaginal odor as described below   Exam: Kim assistant Filed Vitals:   10/29/14 1544  BP: 112/66  Height: 5\' 1"  (1.549 m)  Weight: 118 lb (53.524 kg)   General appearance:  Normal affect, orientation and appearance. Skin: Grossly normal HEENT: Without gross lesions.  No cervical or supraclavicular adenopathy. Thyroid normal.  Lungs:  Clear without wheezing, rales or rhonchi Cardiac: RR, without RMG Abdominal:  Soft, nontender, without masses, guarding, rebound, organomegaly or hernia Breasts:  Examined lying and sitting without masses, retractions, discharge or axillary adenopathy. Pelvic:  Ext/BUS/vagina with atrophic changes. Scant white discharge  Cervix normal with atrophic changes  Uterus anteverted, normal size, shape and contour, midline and mobile nontender   Adnexa  Without masses or tenderness    Anus and perineum  Normal   Rectovaginal  Normal sphincter tone without palpated masses or tenderness.    Assessment/Plan:  58 y.o. G0P0 female for annual exam.   1. Vaginal odor. Patient notes vaginal odor at the end of the day. No real discharge or irritation. No urinary symptoms.  Exam shows a scant white discharge. Wet prep is positive for yeast. Will treat with Diflucan 150 mg 1 dose. Patient will call if her symptoms persist, worsen or recur. 2. Osteopenia.  DEXA 10/2012 with T score -2.1 FRAX a percent/1.1%. Stable from prior DEXA. History of Actonel previously for approximately 5 years and has been off of this for several years. Plan repeat DEXA now  at 2 year interval. Increased calcium and vitamin D reviewed. Recent vitamin D level 30 she's going to supplement with 1000 units daily. 3. Hypothyroid. Recent TSH by Dr. Glori Bickers was elevated and she recently increased her Synthroid dose. She'll continue to follow up with Dr. Glori Bickers in reference to this. 4. Herpes labialis. Is not having any further issues with this like in the past. Does not need Valtrex at this point but will call if becomes an issue. 5. Pap/HPV negative 2014. History of high-grade dysplasia with cone biopsy 1985.  Normal Pap smears since then. Plan repeat Pap smear at 3-5 year interval per current screening guidelines. 6. Mammography is scheduled and patients going to follow up for this. SBE monthly reviewed. 7. Colonoscopy 2005. Patient is planning to schedule this coming year. 8. Health maintenance. No routine blood work done as she had this recently done at Dr. Alba Cory office. Follow up in one year, sooner as needed.     Anastasio Auerbach MD, 4:05 PM 10/29/2014

## 2014-10-29 NOTE — Telephone Encounter (Signed)
Pt called to get a number for grief councling Gave her [facility] [place] phone number

## 2014-10-30 ENCOUNTER — Ambulatory Visit: Payer: Self-pay | Admitting: General Practice

## 2014-10-30 ENCOUNTER — Encounter: Payer: Self-pay | Admitting: Family Medicine

## 2014-10-30 ENCOUNTER — Ambulatory Visit (INDEPENDENT_AMBULATORY_CARE_PROVIDER_SITE_OTHER): Payer: BC Managed Care – PPO | Admitting: Family Medicine

## 2014-10-30 VITALS — BP 120/60 | HR 67 | Temp 98.0°F | Wt 119.0 lb

## 2014-10-30 DIAGNOSIS — R590 Localized enlarged lymph nodes: Secondary | ICD-10-CM

## 2014-10-30 LAB — URINALYSIS W MICROSCOPIC + REFLEX CULTURE
BILIRUBIN URINE: NEGATIVE
Bacteria, UA: NONE SEEN
Casts: NONE SEEN
Crystals: NONE SEEN
GLUCOSE, UA: NEGATIVE mg/dL
Ketones, ur: NEGATIVE mg/dL
Leukocytes, UA: NEGATIVE
Nitrite: NEGATIVE
PROTEIN: NEGATIVE mg/dL
SQUAMOUS EPITHELIAL / LPF: NONE SEEN
Specific Gravity, Urine: 1.008 (ref 1.005–1.030)
Urobilinogen, UA: 0.2 mg/dL (ref 0.0–1.0)
pH: 7 (ref 5.0–8.0)

## 2014-10-30 NOTE — Progress Notes (Signed)
Pre visit review using our clinic review tool, if applicable. No additional management support is needed unless otherwise documented below in the visit note. 

## 2014-10-30 NOTE — Patient Instructions (Signed)
I think you have a small inflamed lymph node in your neck  Try not to touch it too much  I expect it to go down  In a month if it is not smaller or if it grows let me know and we will get you to ENT specialist  I do not feel any other lymph nodes

## 2014-10-30 NOTE — Assessment & Plan Note (Signed)
Shotty (less than 1 cm) , mobile, nt on exam today Suspect reactive   (pt had recent uri) No other adenopathy on exam today  Will watch-if growth or if not smaller/gone in 1 mo consider ENT

## 2014-10-30 NOTE — Progress Notes (Signed)
Subjective:    Patient ID: Jennifer [name], female    DOB: 1956-08-13, 58 y.o.   MRN: [id number]  HPI Here for a lump in throat  She feels "a little ball" in R neck  Noticed it this am  Was tender to the touch and pea sized  Can feel it when she moves certain ways    Sinuses are much better  Always has some allergy symptoms     Very stressed  Not a lot of time to take care of herself  Just dx with yeast vaginitis Also dx with another rotator cuff tear== in L shoulder (has had it in R shoulder in the past)  She did start back on her accurate thyroid dose -feeling ok with that   Patient Active Problem List   Diagnosis Date Noted  . Viral URI with cough 10/15/2014  . Screening for lipoid disorders 09/26/2014  . Preventative health care 09/26/2014  . Vaginal discomfort 08/07/2014  . Urethritis 08/07/2014  . Dysuria 08/07/2014  . Neck pain on left side 04/18/2014  . Left shoulder pain 04/18/2014  . Routine general medical examination at a health care facility 10/28/2012  . Recurrent cold sores 04/28/2011  . GERD 09/16/2010  . Hypothyroidism 05/07/2008  . Osteopenia 05/07/2008  . MIGRAINE WITH AURA 11/11/2007   Past Medical History  Diagnosis Date  . Esophageal reflux   . Migraine with aura, without mention of intractable migraine without mention of status migrainosus   . Allergic rhinitis   . Osteopenia 10/2012    T score -2.1 FRAX 8%/1.1%  . Endometriosis   . Cervical dysplasia   . Hypothyroid    Past Surgical History  Procedure Laterality Date  . Rotator cuff repair  08/2008  . Colonoscopy  2005    normal  . Cervical cone biopsy  1985    severe dysplasia  . Breast surgery      Benign breast lump excised  . Colposcopy     History  Substance Use Topics  . Smoking status: Never Smoker   . Smokeless tobacco: Not on file  . Alcohol Use: 0.0 oz/week    0 Not specified per week   Family History  Problem Relation Age of Onset  . Breast cancer Mother 21  .  Diabetes Mother   . Hypertension Father   . Heart disease Father   . Osteoporosis Maternal Grandmother   . Diabetes Maternal Grandmother    Allergies  Allergen Reactions  . Codeine Phosphate Other (See Comments)    Dizziness, heart palpitations, nervous  . Hydrocodone     REACTION: made nervous--10/09 surgery rotator cuff   Current Outpatient Prescriptions on File Prior to Visit  Medication Sig Dispense Refill  . ALPRAZolam (XANAX) 0.5 MG tablet Take 0.5-1 tablets (0.25-0.5 mg total) by mouth as needed for sleep or anxiety. For travel anxiety 15 tablet 0  . atenolol (TENORMIN) 25 MG tablet Take 12.5 mg by mouth 3 (three) times daily.     . baclofen (LIORESAL) 10 MG tablet Take 10 mg by mouth as needed. For migraine prevention     . fish oil-omega-3 fatty acids 1000 MG capsule Take 1 g by mouth daily.      . fluconazole (DIFLUCAN) 150 MG tablet Take 1 tablet (150 mg total) by mouth once. 1 tablet 0  . fluticasone (FLONASE) 50 MCG/ACT nasal spray USE 2 SPRAYS INTO EACH NOSTRIL DAILY 16 g 4  . ketorolac (TORADOL) 10 MG tablet Take 10 mg by mouth  as needed. With food for Migraine    . levothyroxine (SYNTHROID, LEVOTHROID) 75 MCG tablet Take 1 tablet (75 mcg total) by mouth daily. 30 tablet 3  . loratadine (CLARITIN) 10 MG tablet Take 10 mg by mouth daily.    . nabumetone (RELAFEN) 500 MG tablet Take 0.5 tablets by mouth daily as needed.    . norethindrone-ethinyl estradiol (FEMHRT 1/5) 1-5 MG-MCG TABS Take 1 tablet by mouth daily. 28 tablet 11  . pantoprazole (PROTONIX) 40 MG tablet TAKE 1 TABLET EVERY DAY 30 tablet 11  . promethazine (PHENERGAN) 25 MG tablet Take 25 mg by mouth as needed. When having a migraine    . rizatriptan (MAXALT) 10 MG tablet Take 10 mg by mouth as needed. May repeat in 2 hours if needed     No current facility-administered medications on file prior to visit.      Review of Systems Review of Systems  Constitutional: Negative for fever, appetite change,  and  unexpected weight change. pos for fatigue from stress  Eyes: Negative for pain and visual disturbance.  Respiratory: Negative for cough and shortness of breath.   Cardiovascular: Negative for cp or palpitations    Gastrointestinal: Negative for nausea, diarrhea and constipation.  Genitourinary: Negative for urgency and frequency.  Skin: Negative for pallor or rash   Neurological: Negative for weakness, light-headedness, numbness and headaches.  Hematological: Negative for adenopathy. Does not bruise/bleed easily.  Psychiatric/Behavioral: Negative for dysphoric mood. The patient is not nervous/anxious. Pos for significant stressors          Objective:   Physical Exam  Constitutional: She appears well-developed and well-nourished. No distress.  HENT:  Head: Normocephalic and atraumatic.  Right Ear: External ear normal.  Left Ear: External ear normal.  Nose: Nose normal.  Mouth/Throat: Oropharynx is clear and moist.  Nares are boggy  Eyes: Conjunctivae and EOM are normal. Pupils are equal, round, and reactive to light. No scleral icterus.  Neck: Normal range of motion. Neck supple. No JVD present. No tracheal deviation present. No thyromegaly present.  Small .5-1 cm mobile knot (suspect LN) felt in R ant cervical area nt No skin change   Cardiovascular: Normal rate and regular rhythm.   Pulmonary/Chest: Effort normal and breath sounds normal. No respiratory distress. She has no wheezes. She has no rales.  Lymphadenopathy:    She has cervical adenopathy.       Right cervical: Superficial cervical adenopathy present.    She has no axillary adenopathy.       Right: No inguinal, no supraclavicular and no epitrochlear adenopathy present.       Left: No inguinal, no supraclavicular and no epitrochlear adenopathy present.  No other adenopathy   Neurological: She is alert.  Skin: Skin is warm and dry. No rash noted. No erythema.  Psychiatric: She has a normal mood and affect.  Seems  tired and stressed          Assessment & Plan:   Problem List Items Addressed This Visit      Immune and Lymphatic   Adenopathy, cervical - Primary    Shotty (less than 1 cm) , mobile, nt on exam today Suspect reactive   (pt had recent uri) No other adenopathy on exam today  Will watch-if growth or if not smaller/gone in 1 mo consider ENT

## 2014-11-05 ENCOUNTER — Ambulatory Visit
Admission: RE | Admit: 2014-11-05 | Discharge: 2014-11-05 | Disposition: A | Discharge status: Home / Self Care | Payer: BC Managed Care – PPO | Source: Ambulatory Visit

## 2014-11-05 DIAGNOSIS — Z1231 Encounter for screening mammogram for malignant neoplasm of breast: Secondary | ICD-10-CM

## 2014-11-06 ENCOUNTER — Ambulatory Visit (INDEPENDENT_AMBULATORY_CARE_PROVIDER_SITE_OTHER): Payer: BC Managed Care – PPO

## 2014-11-06 ENCOUNTER — Telehealth: Payer: Self-pay | Admitting: Gynecology

## 2014-11-06 ENCOUNTER — Encounter: Payer: Self-pay | Admitting: Gynecology

## 2014-11-06 DIAGNOSIS — M8580 Other specified disorders of bone density and structure, unspecified site: Secondary | ICD-10-CM

## 2014-11-06 NOTE — Telephone Encounter (Signed)
Tell patient her bone density does show some loss at the spine but still within the normal range. Her vitamin D level done in November was 30 which is the lowest end of the normal range. I would recommend supplementing extra vitamin D daily like 1000 units per day OTC. Repeat the DEXA in 2 years.

## 2014-11-06 NOTE — Telephone Encounter (Signed)
Pt informed with the below note. 

## 2014-11-09 ENCOUNTER — Encounter: Payer: Self-pay | Admitting: Family Medicine

## 2014-11-13 ENCOUNTER — Encounter: Payer: Self-pay | Admitting: Family Medicine

## 2014-11-13 ENCOUNTER — Ambulatory Visit (INDEPENDENT_AMBULATORY_CARE_PROVIDER_SITE_OTHER): Payer: BC Managed Care – PPO | Admitting: Family Medicine

## 2014-11-13 ENCOUNTER — Telehealth: Payer: Self-pay

## 2014-11-13 VITALS — BP 104/64 | HR 62 | Temp 98.6°F | Ht 61.0 in | Wt 119.5 lb

## 2014-11-13 DIAGNOSIS — J309 Allergic rhinitis, unspecified: Secondary | ICD-10-CM | POA: Insufficient documentation

## 2014-11-13 DIAGNOSIS — J069 Acute upper respiratory infection, unspecified: Secondary | ICD-10-CM

## 2014-11-13 DIAGNOSIS — B9789 Other viral agents as the cause of diseases classified elsewhere: Principal | ICD-10-CM

## 2014-11-13 MED ORDER — AZITHROMYCIN 250 MG PO TABS: ORAL_TABLET | ORAL | Status: DC

## 2014-11-13 MED ORDER — MOMETASONE FUROATE 50 MCG/ACT NA SUSP: 2.0000 | Freq: Every day | NASAL | Status: DC

## 2014-11-13 NOTE — Assessment & Plan Note (Signed)
Early in its course with reassuring exam Disc symptomatic care - see instructions on AVS  If symptoms worsen- ie: fever/ facial pain/ inc prod cough -pt has doxycycline to take (has bottle given from ENT) Will keep [place] posted  Has surgery for shoulder planned on 12/30- if still having chest congestion she may need to re schedule this   Update if not starting to improve in a week or if worsening

## 2014-11-13 NOTE — Telephone Encounter (Signed)
I'm fine with that - but I think she has a cold (virus) and I highly doubt that an antibiotic will help (I know she is nervous about it however) Most folks do fine with doxycycline but zpak is fine  Please call it in

## 2014-11-13 NOTE — Telephone Encounter (Signed)
Pt notified of Dr. Marliss Coots comments/recommendations, Rx sent to pharmacy

## 2014-11-13 NOTE — Patient Instructions (Signed)
You have a viral upper respiratory infection with cough  Drink lots of fluids and rest  mucinex is ok  Tylenol (acetaminophen) for pain or fever  You can use nasonex   If you develop fever over 100 degrees/ facial pain/ much worse productive cough- fill the doxycycline you already have in the bottle  If you still have a lot of congestion at the end of the month - you may have to put off your surgery

## 2014-11-13 NOTE — Progress Notes (Signed)
Pre visit review using our clinic review tool, if applicable. No additional management support is needed unless otherwise documented below in the visit note. 

## 2014-11-13 NOTE — Progress Notes (Signed)
Subjective:    Patient ID: Jennifer [name], female    DOB: 12-16-1955, 58 y.o.   MRN: [id number]  HPI Here with uri symptoms   Started getting this yesterday  Hit her quickly  Very busy over the weekend  Sunday - very slight sore throat  Then a little coughing  Now nasal congestion  Also runny nose  ST is better now  No fever  Ears feel full/ not painful    Last time she had sinus infx - was given methylprednisolone   Cough is somewhat productive - white mucous   Is doing saline irrigation also    flonase is giving her headache - and wants to change to nasonex    She is planning surgery on L shoulder on dec 30th   Patient Active Problem List   Diagnosis Date Noted  . Adenopathy, cervical 10/30/2014  . Viral URI with cough 10/15/2014  . Screening for lipoid disorders 09/26/2014  . Preventative health care 09/26/2014  . Vaginal discomfort 08/07/2014  . Urethritis 08/07/2014  . Dysuria 08/07/2014  . Neck pain on left side 04/18/2014  . Left shoulder pain 04/18/2014  . Routine general medical examination at a health care facility 10/28/2012  . Recurrent cold sores 04/28/2011  . GERD 09/16/2010  . Hypothyroidism 05/07/2008  . Osteopenia 05/07/2008  . MIGRAINE WITH AURA 11/11/2007   Past Medical History  Diagnosis Date  . Esophageal reflux   . Migraine with aura, without mention of intractable migraine without mention of status migrainosus   . Allergic rhinitis   . Osteopenia 10/2014    T score -1.9 FRAX 7%/0.7%  . Endometriosis   . Cervical dysplasia   . Hypothyroid    Past Surgical History  Procedure Laterality Date  . Rotator cuff repair  08/2008  . Colonoscopy  2005    normal  . Cervical cone biopsy  1985    severe dysplasia  . Breast surgery      Benign breast lump excised  . Colposcopy     History  Substance Use Topics  . Smoking status: Never Smoker   . Smokeless tobacco: Not on file  . Alcohol Use: 0.0 oz/week    0 Not specified per week   Comment: occ   Family History  Problem Relation Age of Onset  . Breast cancer Mother 33  . Diabetes Mother   . Hypertension Father   . Heart disease Father   . Osteoporosis Maternal Grandmother   . Diabetes Maternal Grandmother    Allergies  Allergen Reactions  . Codeine Phosphate Other (See Comments)    Dizziness, heart palpitations, nervous  . Hydrocodone     REACTION: made nervous--10/09 surgery rotator cuff   Current Outpatient Prescriptions on File Prior to Visit  Medication Sig Dispense Refill  . ALPRAZolam (XANAX) 0.5 MG tablet Take 0.5-1 tablets (0.25-0.5 mg total) by mouth as needed for sleep or anxiety. For travel anxiety 15 tablet 0  . atenolol (TENORMIN) 25 MG tablet Take 12.5 mg by mouth 3 (three) times daily.     . baclofen (LIORESAL) 10 MG tablet Take 10 mg by mouth as needed. For migraine prevention     . fish oil-omega-3 fatty acids 1000 MG capsule Take 1 g by mouth daily.      . fluticasone (FLONASE) 50 MCG/ACT nasal spray USE 2 SPRAYS INTO EACH NOSTRIL DAILY 16 g 4  . ketorolac (TORADOL) 10 MG tablet Take 10 mg by mouth as needed. With food for Migraine    .  levothyroxine (SYNTHROID, LEVOTHROID) 75 MCG tablet Take 1 tablet (75 mcg total) by mouth daily. 30 tablet 3  . loratadine (CLARITIN) 10 MG tablet Take 10 mg by mouth daily.    . nabumetone (RELAFEN) 500 MG tablet Take 0.5 tablets by mouth daily as needed.    . norethindrone-ethinyl estradiol (FEMHRT 1/5) 1-5 MG-MCG TABS Take 1 tablet by mouth daily. 28 tablet 11  . pantoprazole (PROTONIX) 40 MG tablet TAKE 1 TABLET EVERY DAY 30 tablet 11  . promethazine (PHENERGAN) 25 MG tablet Take 25 mg by mouth as needed. When having a migraine    . rizatriptan (MAXALT) 10 MG tablet Take 10 mg by mouth as needed. May repeat in 2 hours if needed     No current facility-administered medications on file prior to visit.      Review of Systems    Review of Systems  Constitutional: Negative for fever, appetite change, and  unexpected weight change.  ENt pos for congestion and rhinorrhea and mild st that is improving/neg for facial pain  Eyes: Negative for pain and visual disturbance.  Respiratory: Negative for wheeze  and shortness of breath.   Cardiovascular: Negative for cp or palpitations    Gastrointestinal: Negative for nausea, diarrhea and constipation.  Genitourinary: Negative for urgency and frequency.  Skin: Negative for pallor or rash   Neurological: Negative for weakness, light-headedness, numbness and headaches.  Hematological: Negative for adenopathy. Does not bruise/bleed easily.  Psychiatric/Behavioral: Negative for dysphoric mood. The patient is not nervous/anxious.      Objective:   Physical Exam  Constitutional: She appears well-developed and well-nourished. No distress.  HENT:  Head: Normocephalic and atraumatic.  Right Ear: External ear normal.  Left Ear: External ear normal.  Mouth/Throat: Oropharynx is clear and moist.  Nares are injected and congested  No sinus tenderness  Throat clear Clear rhinorrhea   Eyes: Conjunctivae and EOM are normal. Pupils are equal, round, and reactive to light. Right eye exhibits no discharge. Left eye exhibits no discharge.  Neck: Normal range of motion. Neck supple.  Cardiovascular: Normal rate and regular rhythm.   Pulmonary/Chest: Effort normal and breath sounds normal. No respiratory distress. She has no wheezes. She has no rales.  Lymphadenopathy:    She has no cervical adenopathy.  Neurological: She is alert.  Skin: Skin is warm and dry. No rash noted.  Psychiatric: She has a normal mood and affect.          Assessment & Plan:   Problem List Items Addressed This Visit      Respiratory   Allergic rhinitis    Switched from flonase to nasonex due to side eff of ha  Will update if no improvement     Viral URI with cough - Primary    Early in its course with reassuring exam Disc symptomatic care - see instructions on AVS  If  symptoms worsen- ie: fever/ facial pain/ inc prod cough -pt has doxycycline to take (has bottle given from ENT) Will keep [place] posted  Has surgery for shoulder planned on 12/30- if still having chest congestion she may need to re schedule this   Update if not starting to improve in a week or if worsening

## 2014-11-13 NOTE — Assessment & Plan Note (Signed)
Switched from flonase to nasonex due to side eff of ha  Will update if no improvement

## 2014-11-13 NOTE — Telephone Encounter (Signed)
Pt left v/m pt was seen today 11/13/14 and pt is concerned about taking Doxycycline. Pt has been told that common side effect of Doxycycline is nausea and pt does not want to take a chance of having nausea. Pt has taken Z pak before  And wants to get Zpak instead of Doxycyclline. Pt also cannot take doxycyline with full glass of water on her surgery date; if pt takes Z pak she will be finished with med prior to surgery date. Pt request cb.

## 2014-11-13 NOTE — Telephone Encounter (Signed)
Pt left v/m ; pt does want to start an abx today ; pt request cb today.

## 2014-11-17 ENCOUNTER — Other Ambulatory Visit: Payer: Self-pay | Admitting: Gynecology

## 2014-11-19 NOTE — Telephone Encounter (Signed)
Pt had annual on 10/29/14 Rx was never prescribed this day. Last prescribed in 10/2013.

## 2014-11-19 NOTE — Telephone Encounter (Signed)
Okay to refill her Jinteli x one year.  Review with her nothing new as far as HRT. Slight increased risk of blood clots such as stroke heart attack DVT and possible slight increased risk of breast cancer. We have discussed this in the past and as long as she feels comfortable with this then refill 1 year.

## 2014-11-20 NOTE — Telephone Encounter (Signed)
Pt informed with the below note, rx sent. 

## 2014-11-21 ENCOUNTER — Ambulatory Visit: Payer: Self-pay | Admitting: General Practice

## 2014-11-30 ENCOUNTER — Telehealth: Payer: Self-pay | Admitting: Family Medicine

## 2014-11-30 NOTE — Telephone Encounter (Signed)
Pt called, concerned about bill she received from [facility].  States that she works in [place] for a drug screen is excessive and she can get completed in [place] for $45.  She also states that she was unaware that her insurance was being billed for this amount.  Gave her [facility] toll free number and also private number for [employer] from [employer].  I called her back after she spoke with [employer], she understood and was OK with their direction.

## 2015-01-23 ENCOUNTER — Other Ambulatory Visit: Payer: Self-pay | Admitting: Family Medicine

## 2015-03-11 ENCOUNTER — Other Ambulatory Visit: Payer: Self-pay

## 2015-03-11 MED ORDER — LEVOTHYROXINE SODIUM 75 MCG PO TABS: 75.0000 ug | ORAL_TABLET | Freq: Every day | ORAL | Status: DC

## 2015-03-11 NOTE — Telephone Encounter (Signed)
PLEASE NOTE: All timestamps contained within this report are represented as [place] Standard Time. CONFIDENTIALTY NOTICE: This fax transmission is intended only for the addressee. It contains information that is legally privileged, confidential or otherwise protected from use or disclosure. If you are not the intended recipient, you are strictly prohibited from reviewing, disclosing, copying using or disseminating any of this information or taking any action in reliance on or regarding this information. If you have received this fax in error, please notify us immediately by telephone so that we can arrange for its return to [place]. Phone: [phone], Toll-Free: [phone], Fax: [phone] Page: 1 of 1 Call Id: [id number] [place] Patient Name: Jennifer [name] Gender: Female DOB: [birth year] Age: 59 Y 5 M 6 D Return Phone Number: [phone] (Primary) Address: [place] City/State/Zip: [place] [address] Client [facility] Night - Client Client Site [facility] [place] Contact Type Call Call Type Triage / Clinical Relationship To Patient Self Return Phone Number [phone] (Primary) Chief Complaint Prescription Refill or Medication Request (non symptomatic) Initial Comment Caller states the Dr refused her request for a refill and she needs her medication Nurse Assessment Nurse: Einar Gip, RN, Neoma Laming Date/Time (Eastern Time): 03/08/2015 6:03:07 PM Confirm and document reason for call. If symptomatic, describe symptoms. ---Caller states her levothyroxine was increased in December and the doctor has refused to refill the prescription. Advised if she needs the medication before Monday she will have to go somewhere to be seen. Advised she should contact the office on Monday regarding the prescription. Caller  verbalized understanding. Has the patient traveled out of the country within the last 30 days? ---Not Applicable Does the patient require triage? ---No Guidelines Guideline Title Affirmed Question Affirmed Notes Nurse Date/Time (Eastern Time) Disp. Time Eilene Ghazi Time) Disposition Final User 03/08/2015 6:06:32 PM Clinical Call Yes Einar Gip, RN, Neoma Laming After Care Instructions Given Call Event Type User Date / Time Description

## 2015-03-11 NOTE — Telephone Encounter (Signed)
Pt has been out of levothyroxine 75 mcg for 4 days; pt has been taking the levothyroxine 75 mcg and doing well. Pt request refill levothyroxine 75 mcg to [facility] ASAP. Pt said no one told her to have another TSH after being seen 10/01/14. Pt request cb this morning.

## 2015-03-11 NOTE — Telephone Encounter (Signed)
Spoke with patient and advised results rx sent to pharmacy by e-script Verbally discussed with Dr. Glori Bickers.

## 2015-03-17 ENCOUNTER — Emergency Department
Admit: 2015-03-17 | Disposition: A | Discharge status: Home / Self Care | Payer: Self-pay | Admitting: Emergency Medicine

## 2015-03-17 LAB — COMPREHENSIVE METABOLIC PANEL
ANION GAP: 11 (ref 7–16)
Albumin: 4.4 g/dL
Alkaline Phosphatase: 48 U/L
BUN: 13 mg/dL
Bilirubin,Total: 1.1 mg/dL
CALCIUM: 8.8 mg/dL — AB
CHLORIDE: 103 mmol/L
Co2: 26 mmol/L
Creatinine: 0.72 mg/dL
Glucose: 126 mg/dL — ABNORMAL HIGH
Potassium: 3.5 mmol/L
SGOT(AST): 22 U/L
SGPT (ALT): 23 U/L
Sodium: 140 mmol/L
TOTAL PROTEIN: 7.6 g/dL

## 2015-03-17 LAB — CBC WITH DIFFERENTIAL/PLATELET
BASOS ABS: 0 10*3/uL (ref 0.0–0.1)
Basophil %: 0.4 %
Eosinophil #: 0 10*3/uL (ref 0.0–0.7)
Eosinophil %: 0.2 %
HCT: 40.7 % (ref 35.0–47.0)
HGB: 14.1 g/dL (ref 12.0–16.0)
LYMPHS ABS: 0.3 10*3/uL — AB (ref 1.0–3.6)
Lymphocyte %: 3.1 %
MCH: 31.9 pg (ref 26.0–34.0)
MCHC: 34.5 g/dL (ref 32.0–36.0)
MCV: 92 fL (ref 80–100)
MONOS PCT: 2.8 %
Monocyte #: 0.3 x10 3/mm (ref 0.2–0.9)
NEUTROS ABS: 8.7 10*3/uL — AB (ref 1.4–6.5)
Neutrophil %: 93.5 %
Platelet: 255 10*3/uL (ref 150–440)
RBC: 4.41 10*6/uL (ref 3.80–5.20)
RDW: 12.3 % (ref 11.5–14.5)
WBC: 9.3 10*3/uL (ref 3.6–11.0)

## 2015-03-17 LAB — URINALYSIS, COMPLETE
BACTERIA: NONE SEEN
Bilirubin,UR: NEGATIVE
Glucose,UR: NEGATIVE mg/dL (ref 0–75)
LEUKOCYTE ESTERASE: NEGATIVE
NITRITE: NEGATIVE
Ph: 7 (ref 4.5–8.0)
Protein: 30
Specific Gravity: 1.02 (ref 1.003–1.030)

## 2015-03-20 ENCOUNTER — Encounter: Payer: Self-pay | Admitting: Internal Medicine

## 2015-03-20 ENCOUNTER — Ambulatory Visit (INDEPENDENT_AMBULATORY_CARE_PROVIDER_SITE_OTHER): Payer: BLUE CROSS/BLUE SHIELD | Admitting: Internal Medicine

## 2015-03-20 VITALS — BP 106/72 | HR 69 | Temp 98.7°F | Wt 121.5 lb

## 2015-03-20 DIAGNOSIS — R1110 Vomiting, unspecified: Secondary | ICD-10-CM | POA: Diagnosis not present

## 2015-03-20 NOTE — Patient Instructions (Signed)

## 2015-03-20 NOTE — Progress Notes (Signed)
Subjective:    Patient ID: Jennifer [name], female    DOB: [birth year], 60 y.o.   MRN: [id number]  HPI  Pt presents to the clinic today for hospital follow up. She went to the ER 03/17/15 with c/o vomiting. She had not had any abdominal pain, fever or diarrhea. She does have a history of GERD and is taking Protonix daily. Her lab work was normal. Her exam was normal. She thought this was related to eating out at [employer] the night prior. No intervention was needed and she was discharged home to follow up with her PCP. She reports she has not had any nausea or vomiting since that time. She has had some issues with constipation. She did use a suppository with good relief.  Review of Systems      Past Medical History  Diagnosis Date  . Esophageal reflux   . Migraine with aura, without mention of intractable migraine without mention of status migrainosus   . Allergic rhinitis   . Osteopenia 10/2014    T score -1.9 FRAX 7%/0.7%  . Endometriosis   . Cervical dysplasia   . Hypothyroid     Current Outpatient Prescriptions  Medication Sig Dispense Refill  . ALPRAZolam (XANAX) 0.5 MG tablet Take 0.5-1 tablets (0.25-0.5 mg total) by mouth as needed for sleep or anxiety. For travel anxiety 15 tablet 0  . atenolol (TENORMIN) 25 MG tablet Take 12.5 mg by mouth 3 (three) times daily.     . baclofen (LIORESAL) 10 MG tablet Take 10 mg by mouth as needed. For migraine prevention     . fish oil-omega-3 fatty acids 1000 MG capsule Take 1 g by mouth daily.      Marland Kitchen JINTELI 1-5 MG-MCG TABS TAKE 1 TABLET BY MOUTH DAILY. 90 tablet 3  . ketorolac (TORADOL) 10 MG tablet Take 10 mg by mouth as needed. With food for Migraine    . levothyroxine (SYNTHROID, LEVOTHROID) 75 MCG tablet Take 1 tablet (75 mcg total) by mouth daily. 30 tablet 0  . loratadine (CLARITIN) 10 MG tablet Take 10 mg by mouth daily.    . mometasone (NASONEX) 50 MCG/ACT nasal spray Place 2 sprays into the nose daily. 17 g 12  . nabumetone  (RELAFEN) 500 MG tablet Take 0.5 tablets by mouth daily as needed.    . pantoprazole (PROTONIX) 40 MG tablet TAKE 1 TABLET EVERY DAY 30 tablet 1  . promethazine (PHENERGAN) 25 MG tablet Take 25 mg by mouth as needed. When having a migraine    . rizatriptan (MAXALT) 10 MG tablet Take 10 mg by mouth as needed. May repeat in 2 hours if needed     No current facility-administered medications for this visit.    Allergies  Allergen Reactions  . Codeine Phosphate Other (See Comments)    Dizziness, heart palpitations, nervous  . Flonase [Fluticasone Propionate] Other (See Comments)    Worsens her migraine   . Hydrocodone     REACTION: made nervous--10/09 surgery rotator cuff    Family History  Problem Relation Age of Onset  . Breast cancer Mother 62  . Diabetes Mother   . Hypertension Father   . Heart disease Father   . Osteoporosis Maternal Grandmother   . Diabetes Maternal Grandmother     History   Social History  . Marital Status: Married    Spouse Name: N/A  . Number of Children: N/A  . 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 Smoking status: Never Smoker   . Smokeless tobacco: Not on file  . Alcohol Use: 0.0 oz/week    0 Standard drinks or equivalent per week     Comment: occ  . Drug Use: No  . Sexual Activity: Not Currently    Birth Control/ Protection: Post-menopausal   Other Topics Concern  . Not on file   Social History Narrative   [employer] her husband in 9/14     Constitutional: Denies fever, malaise, fatigue, headache or abrupt weight changes.  Respiratory: Denies difficulty breathing, shortness of breath, cough or sputum production.   Cardiovascular: Denies chest pain, chest tightness, palpitations or swelling in the hands or feet.  Gastrointestinal: Denies abdominal pain, bloating, diarrhea or blood in the stool.     No other specific complaints in a complete review of systems (except as  listed in HPI above).  Objective:   Physical Exam   BP 106/72 mmHg  Pulse 69  Temp(Src) 98.7 F (37.1 C) (Oral)  Wt 121 lb 8 oz (55.112 kg)  SpO2 98% Wt Readings from Last 3 Encounters:  03/20/15 121 lb 8 oz (55.112 kg)  11/13/14 119 lb 8 oz (54.205 kg)  10/30/14 119 lb (53.978 kg)    General: Appears her stated age, well developed, well nourished in NAD. Cardiovascular: Normal rate and rhythm. S1,S2 noted.  No murmur, rubs or gallops noted.  Pulmonary/Chest: Normal effort and positive vesicular breath sounds. No respiratory distress. No wheezes, rales or ronchi noted.  Abdomen: Soft and nontender. Normal bowel sounds, no bruits noted. No distention or masses noted.    BMET    Component Value Date/Time   NA 138 09/28/2014 0944   K 3.9 09/28/2014 0944   CL 103 09/28/2014 0944   CO2 24 09/28/2014 0944   GLUCOSE 97 09/28/2014 0944   BUN 15 09/28/2014 0944   CREATININE 0.8 09/28/2014 0944   CALCIUM 9.2 09/28/2014 0944   GFRNONAA 93.07 08/21/2009 1107    Lipid Panel     Component Value Date/Time   CHOL 187 09/28/2014 0944   TRIG 75.0 09/28/2014 0944   HDL 55.50 09/28/2014 0944   CHOLHDL 3 09/28/2014 0944   VLDL 15.0 09/28/2014 0944   LDLCALC 117* 09/28/2014 0944    CBC    Component Value Date/Time   WBC 6.9 09/28/2014 0944   RBC 4.25 09/28/2014 0944   HGB 13.5 09/28/2014 0944   HCT 39.9 09/28/2014 0944   PLT 272.0 09/28/2014 0944   MCV 94.0 09/28/2014 0944   MCHC 33.9 09/28/2014 0944   RDW 12.4 09/28/2014 0944   LYMPHSABS 1.9 09/28/2014 0944   MONOABS 0.7 09/28/2014 0944   EOSABS 0.1 09/28/2014 0944   BASOSABS 0.0 09/28/2014 0944    Hgb A1C No results found for: HGBA1C      Assessment & Plan:   Hospital follow up for vomiting:  Hospital note and labs reviewed She has a RX for phenergan as needed for nausea Continue Protonix daily Watch for increased nausea, recurrent vomiting, bloating or constipation  RTC as needed or if symptoms persist or  worsen

## 2015-03-20 NOTE — Op Note (Signed)
PATIENT NAME:  Jennifer, [name] MR#:  [id number] DATE OF BIRTH:  06/03/1956  DATE OF PROCEDURE:  11/21/2014  PREOPERATIVE DIAGNOSIS: Left rotator cuff tear.   POSTOPERATIVE DIAGNOSIS: Left rotator cuff tear (supraspinatus, chronic).   PROCEDURE PERFORMED: Left subacromial decompression and rotator cuff repair.   SURGEON: Skip Estimable, MD.   ANESTHESIA: Interscalene block and general endotracheal anesthesia.   ESTIMATED BLOOD LOSS: 20 mL.   FLUIDS REPLACED: Crystalloid 1300 mL.   DRAINS: None.   IMPLANTS UTILIZED: ArthroCare Spartan 5.5 mm PEEK suture anchors x 2.   INDICATIONS FOR SURGERY: The patient is a 59 year old female who has been seen for complaints of persistent left shoulder pain. She did not see any significant improvement despite conservative measures including physical therapy. MRI demonstrated findings consistent with full-thickness tear of the supraspinatus tendon with mild retraction. After discussion of the risks and benefits of surgical intervention, the patient expressed understanding of the risks, benefits, and agreed with plans for surgical intervention.   PROCEDURE IN DETAIL: The patient was brought to the operating room and, after adequate interscalene block and general endotracheal anesthesia was achieved, the patient was placed in the modified beach chair position. The head was secured in headrest and all bony prominences were well padded. The patient's left shoulder and arm were cleaned and prepped with alcohol and DuraPrep, draped in the usual sterile fashion. A "timeout" was performed as per usual protocol. The anticipated incision site was injected with 0.25% Marcaine with epinephrine. An anterior oblique incision was made roughly bisecting the anterior aspect of the acromion. The deltoid was split in line and a "deltoid on" approach was utilized by elevating the deltoid in a subperiosteal fashion off the acromion. The subdeltoid bursa was noted to be thickened and  inflamed. The subdeltoid bursa was excised. A Darrach retractor was inserted so as to protect the cuff and then an osteotome was used to removed an anterior-inferior wafer of bone from the acromion measuring approximately 3 mm in thickness. Additional decompression and contouring was performed using a TPS high-speed rasp. The wound was irrigated with copious amounts of normal saline with antibiotic solution and then suctioned dry. The shoulder was placed through range of motion and there was noted to be full-thickness tear of the supraspinatus tendon with retraction centrally. Rongeurs were used to create a bony trough along the greater tuberosity. Two Spartan 5.5 mm PEEK suture anchors were inserted and the leading edge of the supraspinatus tendon was repaired using the associated sutures. The shoulder was placed through a range of motion with good maintenance of the repair. Good decompression of the subacromial space was also noted. The wound was irrigated with copious amounts of normal saline with antibiotic solution. Deltoid was repaired in side-to-side fashion using interrupted sutures of #1 Ethibond. The subcutaneous tissue was approximated in layers using first #0 Vicryl followed by #2-0 Vicryl. Skin was closed with a running subcuticular suture of #4-0 Vicryl. Steri-Strips were applied followed by application of a sterile dressing. The patient tolerated the procedure well. She was transported to the recovery room in stable condition.    ____________________________ Jennifer [name]. Jennifer [name]., MD jph:TT D: 11/21/2014 19:54:17 ET T: 11/21/2014 20:23:57 ET JOB#: [id number]  cc: Jennifer [name]. Jennifer [name]., MD, <Dictator> Jennifer P Jennifer Bouche MD ELECTRONICALLY SIGNED 12/10/2014 0:45

## 2015-03-20 NOTE — Progress Notes (Signed)
Pre visit review using our clinic review tool, if applicable. No additional management support is needed unless otherwise documented below in the visit note. 

## 2015-03-22 ENCOUNTER — Telehealth: Payer: Self-pay | Admitting: Family Medicine

## 2015-03-22 ENCOUNTER — Other Ambulatory Visit: Payer: Self-pay | Admitting: *Deleted

## 2015-03-22 MED ORDER — PANTOPRAZOLE SODIUM 40 MG PO TBEC: 40.0000 mg | DELAYED_RELEASE_TABLET | Freq: Every day | ORAL | Status: DC

## 2015-03-22 NOTE — Telephone Encounter (Signed)
Patient called back stating she will only be taking Protonix once a day.  Refill sent to pharmacy.

## 2015-03-22 NOTE — Telephone Encounter (Signed)
That's fine -please add A1C to her lab orders for dx: hyperglycemia  Thanks

## 2015-03-22 NOTE — Telephone Encounter (Signed)
Left voicemail letting pt know we can add a1c to labs

## 2015-03-22 NOTE — Telephone Encounter (Signed)
Patient is requesting refill on Protonix last filled #30 x1 refill on 01/23/15.  Patient states she was instructed to take BID instead of QD due to recent food poisoning and is requesting #60 so that she will not run out.  Okay to change Rx?

## 2015-03-22 NOTE — Telephone Encounter (Signed)
Pt went to ed on Sunday with food poisoning and blood sugar was 126.  Pt would like to have a1c checked, can she do on Tues when she comes in for labs? Pt has a strong family history of diabetes.  [phone] for call back. Thanks.

## 2015-03-26 ENCOUNTER — Other Ambulatory Visit: Payer: Self-pay

## 2015-03-27 ENCOUNTER — Other Ambulatory Visit: Payer: Self-pay | Admitting: Family Medicine

## 2015-03-27 DIAGNOSIS — R739 Hyperglycemia, unspecified: Secondary | ICD-10-CM

## 2015-03-29 ENCOUNTER — Ambulatory Visit (INDEPENDENT_AMBULATORY_CARE_PROVIDER_SITE_OTHER): Payer: BLUE CROSS/BLUE SHIELD | Admitting: Primary Care

## 2015-03-29 ENCOUNTER — Encounter: Payer: Self-pay | Admitting: Primary Care

## 2015-03-29 VITALS — BP 110/64 | HR 70 | Temp 97.6°F | Ht 61.0 in | Wt 119.8 lb

## 2015-03-29 DIAGNOSIS — R109 Unspecified abdominal pain: Secondary | ICD-10-CM

## 2015-03-29 DIAGNOSIS — E039 Hypothyroidism, unspecified: Secondary | ICD-10-CM | POA: Diagnosis not present

## 2015-03-29 DIAGNOSIS — R739 Hyperglycemia, unspecified: Secondary | ICD-10-CM

## 2015-03-29 LAB — TSH: TSH: 2.18 u[IU]/mL (ref 0.35–4.50)

## 2015-03-29 LAB — HEMOGLOBIN A1C: Hgb A1c MFr Bld: 5.5 % (ref 4.6–6.5)

## 2015-03-29 NOTE — Patient Instructions (Addendum)
Obtain your stool container prior to leaving today. I will notify you of your results. Please return to the emergency department if you experience any bleeding, worsening pain, vomiting over the weekend. It was nice meeting you!

## 2015-03-29 NOTE — Progress Notes (Signed)
Subjective:    Patient ID: Jennifer [name], female    DOB: Sep 09, 1956, 59 y.o.   MRN: [id number]  HPI  Jennifer [name] is a 59 year old female who presents today with a chief complaint of abdominal cramping. Her symptoms began on 4/23  after eating at a [employer]. She ate dinner around 6pm and then began experiecing nausea, vomiting, and abdominal cramping at 10pm that night. Her symptoms continued so she presented to the emergency department Sunday 4/24 where they administered IV fluids and attributed her symptoms to food poisoning. Her lab work was unremarkable. She was evaluated on 4/27 and had no further episodes of vomiting at that point. She started to feel better this past Monday, and has been able to advance to her regular diet this week without cramping. Yesterday she prepared  her smoothy and started feeling cramping again, which dissipated.  She received an last night from [employer] reporting listeria in presence in the organic products that she purchases. She started feeling naused again last night so she took some phenergan with relief. Overall her stomach continues to feel "girgly" and experienced some cramping early this morning at 3am but was not as intense as in the past. She drank camolle tea and was felt better. She had her smoothie for breakfast today without difficulty and reports her bowel movements have begun to normalize. She doesn't feel 100% herself. Denies fevers, recent diarrhea, bloody stools.  Review of Systems  Constitutional: Negative for fever and chills.  Respiratory: Negative for shortness of breath.   Cardiovascular: Negative for chest pain.  Gastrointestinal: Positive for nausea. Negative for vomiting, diarrhea, constipation and blood in stool.       Abdominal cramping. Mostly overactivity.  Musculoskeletal: Negative for myalgias.  Neurological: Positive for headaches. Negative for dizziness and weakness.       Past Medical History  Diagnosis Date  . Esophageal  reflux   . Migraine with aura, without mention of intractable migraine without mention of status migrainosus   . Allergic rhinitis   . Osteopenia 10/2014    T score -1.9 FRAX 7%/0.7%  . Endometriosis   . Cervical dysplasia   . Hypothyroid     History   Social History  . Marital Status: Married    Spouse Name: N/A  . Number of Children: N/A  . 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 Smoking status: Never Smoker   . Smokeless tobacco: Not on file  . Alcohol Use: 0.0 oz/week    0 Standard drinks or equivalent per week     Comment: rare  . Drug Use: No  . Sexual Activity: Not Currently    Birth Control/ Protection: Post-menopausal   Other Topics Concern  . Not on file   Social History Narrative   [place] her husband in 9/14    Past Surgical History  Procedure Laterality Date  . Rotator cuff repair  08/2008  . Colonoscopy  2005    normal  . Cervical cone biopsy  1985    severe dysplasia  . Breast surgery      Benign breast lump excised  . Colposcopy      Family History  Problem Relation Age of Onset  . Breast cancer Mother 70  . Diabetes Mother   . Hypertension Father   . Heart disease Father   . Osteoporosis Maternal Grandmother   . Diabetes Maternal Grandmother     Allergies  Allergen Reactions  . Codeine Phosphate Other (See Comments)    Dizziness, heart palpitations, nervous  . Flonase [Fluticasone Propionate] Other (See Comments)    Worsens her migraine   . Hydrocodone     REACTION: made nervous--10/09 surgery rotator cuff    Current Outpatient Prescriptions on File Prior to Visit  Medication Sig Dispense Refill  . ALPRAZolam (XANAX) 0.5 MG tablet Take 0.5-1 tablets (0.25-0.5 mg total) by mouth as needed for sleep or anxiety. For travel anxiety 15 tablet 0  . atenolol (TENORMIN) 25 MG tablet Take 12.5 mg by mouth 3 (three) times daily.     . baclofen (LIORESAL) 10 MG tablet Take 10  mg by mouth as needed. For migraine prevention     . fish oil-omega-3 fatty acids 1000 MG capsule Take 1 g by mouth daily.      Marland Kitchen JINTELI 1-5 MG-MCG TABS TAKE 1 TABLET BY MOUTH DAILY. 90 tablet 3  . ketorolac (TORADOL) 10 MG tablet Take 10 mg by mouth as needed. With food for Migraine    . levothyroxine (SYNTHROID, LEVOTHROID) 75 MCG tablet Take 1 tablet (75 mcg total) by mouth daily. 30 tablet 0  . loratadine (CLARITIN) 10 MG tablet Take 10 mg by mouth daily.    . mometasone (NASONEX) 50 MCG/ACT nasal spray Place 2 sprays into the nose daily. 17 g 12  . nabumetone (RELAFEN) 500 MG tablet Take 0.5 tablets by mouth daily as needed.    . pantoprazole (PROTONIX) 40 MG tablet Take 1 tablet (40 mg total) by mouth daily. 30 tablet 11  . promethazine (PHENERGAN) 25 MG tablet Take 25 mg by mouth as needed. When having a migraine    . rizatriptan (MAXALT) 10 MG tablet Take 10 mg by mouth as needed. May repeat in 2 hours if needed     No current facility-administered medications on file prior to visit.    BP 110/64 mmHg  Pulse 70  Temp(Src) 97.6 F (36.4 C) (Oral)  Ht 5\' 1"  (1.549 m)  Wt 119 lb 12.8 oz (54.341 kg)  BMI 22.65 kg/m2    Objective:   Physical Exam  Constitutional: She is oriented to person, place, and time. She appears well-developed. She does not appear ill.  Neck: Neck supple.  Cardiovascular: Normal rate and regular rhythm.   Pulmonary/Chest: Effort normal and breath sounds normal.  Abdominal: Soft. Bowel sounds are increased. There is no splenomegaly or hepatomegaly.  Tender to LMQ and periumbilical region.   Lymphadenopathy:    She has no cervical adenopathy.  Neurological: She is alert and oriented to person, place, and time.  Skin: Skin is warm and dry.          Assessment & Plan:  Abdominal cramping:  Could possibly be due to bacteria in food products. CMP and CBC from hospital visit 2 weeks ago unremarkable regarding infection or hepatic/gallbladder  involvement. Overall, she seems to be improving. Cramping decreased in intensity, no diarrhea, stools are returning to normal, she's able to eat her regular diet. Stool cultures today, will treat accordingly. Phenergan PRN. Return to the ER if she develops worsening symptoms over the weekend.

## 2015-03-29 NOTE — Progress Notes (Signed)
Pre visit review using our clinic review tool, if applicable. No additional management support is needed unless otherwise documented below in the visit note. 

## 2015-04-02 ENCOUNTER — Other Ambulatory Visit: Payer: Self-pay

## 2015-04-09 ENCOUNTER — Other Ambulatory Visit: Payer: Self-pay | Admitting: Family Medicine

## 2015-05-16 ENCOUNTER — Other Ambulatory Visit: Payer: Self-pay

## 2015-05-16 MED ORDER — ALPRAZOLAM 0.5 MG PO TABS: 0.2500 mg | ORAL_TABLET | ORAL | Status: DC | PRN

## 2015-05-16 NOTE — Telephone Encounter (Signed)
Rx called in as directed and patient notified.  

## 2015-05-16 NOTE — Telephone Encounter (Signed)
plz phone in. 

## 2015-05-16 NOTE — Telephone Encounter (Signed)
Pt just found out last night that pt has to fly to [facility] and be at airport at 7 AM at [facility]. Pt realized she needs refill on alprazolam and request refill this afternoon because she needs the med for traveling. Pt will ck with pharmacy later today.Please advise.

## 2015-08-27 ENCOUNTER — Ambulatory Visit: Payer: BLUE CROSS/BLUE SHIELD

## 2015-08-28 ENCOUNTER — Ambulatory Visit (INDEPENDENT_AMBULATORY_CARE_PROVIDER_SITE_OTHER): Payer: BLUE CROSS/BLUE SHIELD

## 2015-08-28 DIAGNOSIS — Z23 Encounter for immunization: Secondary | ICD-10-CM

## 2015-09-06 ENCOUNTER — Other Ambulatory Visit: Payer: Self-pay | Admitting: Family Medicine

## 2015-09-06 NOTE — Telephone Encounter (Signed)
Px written for call in   

## 2015-09-06 NOTE — Telephone Encounter (Signed)
Pt left v/m requesting status of refill request from [facility] for alprazolam. rx last refilled # 15 on 05/16/15. Last f/u appt 10/01/14. Last acute visit 03/29/15 No future appt scheduled.

## 2015-09-06 NOTE — Telephone Encounter (Signed)
Rx called in as prescribed 

## 2015-10-08 ENCOUNTER — Other Ambulatory Visit: Payer: Self-pay

## 2015-10-08 DIAGNOSIS — Z1231 Encounter for screening mammogram for malignant neoplasm of breast: Secondary | ICD-10-CM

## 2015-10-25 LAB — HM PAP SMEAR: HM PAP: NORMAL

## 2015-11-04 ENCOUNTER — Encounter: Payer: Self-pay | Admitting: Gynecology

## 2015-11-04 ENCOUNTER — Ambulatory Visit (INDEPENDENT_AMBULATORY_CARE_PROVIDER_SITE_OTHER): Payer: BLUE CROSS/BLUE SHIELD | Admitting: Gynecology

## 2015-11-04 ENCOUNTER — Other Ambulatory Visit: Payer: Self-pay | Admitting: Family Medicine

## 2015-11-04 VITALS — BP 120/70 | Ht 61.0 in | Wt 122.0 lb

## 2015-11-04 DIAGNOSIS — Z01419 Encounter for gynecological examination (general) (routine) without abnormal findings: Secondary | ICD-10-CM | POA: Diagnosis not present

## 2015-11-04 DIAGNOSIS — N952 Postmenopausal atrophic vaginitis: Secondary | ICD-10-CM

## 2015-11-04 DIAGNOSIS — B001 Herpesviral vesicular dermatitis: Secondary | ICD-10-CM | POA: Diagnosis not present

## 2015-11-04 MED ORDER — VALACYCLOVIR HCL 1 G PO TABS: ORAL_TABLET | ORAL | Status: DC

## 2015-11-04 NOTE — Progress Notes (Addendum)
Jennifer [name] [birth year] ZH:[id number]        59 y.o.  G0P0  for annual exam.  Doing well without complaints  Past medical history,surgical history, problem list, medications, allergies, family history and social history were all reviewed and documented as reviewed in the EPIC chart.  ROS:  Performed with pertinent positives and negatives included in the history, assessment and plan.   Additional significant findings :  none   Exam:  [name] Vitals:   11/04/15 1538  BP: 120/70  Height: 5\' 1"  (1.549 m)  Weight: 122 lb (55.339 kg)   General appearance:  Normal affect, orientation and appearance. Skin: Grossly normal excepting classic cold sore left upper lip HEENT: Without gross lesions.  No cervical or supraclavicular adenopathy. Thyroid normal.  Lungs:  Clear without wheezing, rales or rhonchi Cardiac: RR, without RMG Abdominal:  Soft, nontender, without masses, guarding, rebound, organomegaly or hernia Breasts:  Examined lying and sitting without masses, retractions, discharge or axillary adenopathy. Pelvic:  Ext/BUS/vagina with atrophic changes  Cervix with atrophic changes  Uterus axial, normal size, shape and contour, midline and mobile nontender   Adnexa  Without masses or tenderness    Anus and perineum  Normal   Rectovaginal  Normal sphincter tone without palpated masses or tenderness.    Assessment/Plan:  59 y.o. G0P0 female for annual exam.   1. Postmenopausal/atrophic genital changes.  Doing well without significant hot flashes night sweats vaginal dryness or any vaginal bleeding.  Was on [facility] 1/5 and I was under the impression that she had discontinued this but apparently she has continued on this and we did not discuss this at that visit. I'm going to call her in follow up to review again with her the risks as I have previously and to see if she wants to continue on this now. 2. Osteopenia. DEXA 10/2014 T score -1.9 FRAX 7%/0.7%. Plan repeat next year a  two-year interval. Increased calcium vitamin D reviewed. 3. Herpes labialis. Patient with occasional outbreaks. Currently has one now.  Recommend Valtrex 2 g 12 hours apart 2 doses at earliest onset of outbreak.  #16 with 2 refills ordered. 4. Mammography 10/2014. Patient has scheduled next week. SBE monthly reviewed. 5. Pap smear/HPV negative 2014. No Pap smear done today. 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 Normal Pap smears since. Plan repeat at 3-5 year interval. 6. Colonoscopy due now. Patient asked me about alternatives. I recommended she discuss this with her primary physician as well as her gastroenterologist. She knows though that she is due now for studies. 7. Health maintenance. No routine lab work done as patient relates that she is in the process of setting up an appointment with her primary physician will have her lab work done through their office.  Addendum 11/06/2015: See telephone note 11/06/2015 for discussion about her HRT.  Anastasio Auerbach MD, 4:08 PM 11/04/2015

## 2015-11-04 NOTE — Patient Instructions (Signed)
Use the Valtrex 2 tablets 12 hours apart for 1 day. That's a total of 4 tablets altogether at the earliest onset of cold sore symptoms  You may obtain a copy of any labs that were done today by logging onto MyChart as outlined in the instructions provided with your AVS (after visit summary). The office will not call with normal lab results but certainly if there are any significant abnormalities then we will contact you.   Health Maintenance Adopting a healthy lifestyle and getting preventive care can go a long way to promote health and wellness. Talk with your health care provider about what schedule of regular examinations is right for you. This is a good chance for you to check in with your provider about disease prevention and staying healthy. In between checkups, there are plenty of things you can do on your own. Experts have done a lot of research about which lifestyle changes and preventive measures are most likely to keep you healthy. Ask your health care provider for more information. WEIGHT AND DIET  Eat a healthy diet  Be sure to include plenty of vegetables, fruits, low-fat dairy products, and lean protein.  Do not eat a lot of foods high in solid fats, added sugars, or salt.  Get regular exercise. This is one of the most important things you can do for your health.  Most adults should exercise for at least 150 minutes each week. The exercise should increase your heart rate and make you sweat (moderate-intensity exercise).  Most adults should also do strengthening exercises at least twice a week. This is in addition to the moderate-intensity exercise.  Maintain a healthy weight  Body mass index (BMI) is a measurement that can be used to identify possible weight problems. It estimates body fat based on height and weight. Your health care provider can help determine your BMI and help you achieve or maintain a healthy weight.  For females 37 years of age and older:   A BMI below  18.5 is considered underweight.  A BMI of 18.5 to 24.9 is normal.  A BMI of 25 to 29.9 is considered overweight.  A BMI of 30 and above is considered obese.  Watch levels of cholesterol and blood lipids  You should start having your blood tested for lipids and cholesterol at 59 years of age, then have this test every 5 years.  You may need to have your cholesterol levels checked more often if:  Your lipid or cholesterol levels are high.  You are older than 59 years of age.  You are at high risk for heart disease.  CANCER SCREENING   Lung Cancer  Lung cancer screening is recommended for adults 61-21 years old who are at high risk for lung cancer because of a history of smoking.  A yearly low-dose CT scan of the lungs is recommended for people who:  Currently smoke.  Have quit within the past 15 years.  Have at least a 30-pack-year history of smoking. A pack year is smoking an average of one pack of cigarettes a day for 1 year.  Yearly screening should continue until it has been 15 years since you quit.  Yearly screening should stop if you develop a health problem that would prevent you from having lung cancer treatment.  Breast Cancer  Practice breast self-awareness. This means understanding how your breasts normally appear and feel.  It also means doing regular breast self-exams. Let your health care provider know about any changes, no  matter how small.  If you are in your 20s or 30s, you should have a clinical breast exam (CBE) by a health care provider every 1-3 years as part of a regular health exam.  If you are 62 or older, have a CBE every year. Also consider having a breast X-ray (mammogram) every year.  If you have a family history of breast cancer, talk to your health care provider about genetic screening.  If you are at high risk for breast cancer, talk to your health care provider about having an MRI and a mammogram every year.  Breast cancer gene (BRCA)  assessment is recommended for women who have family members with BRCA-related cancers. BRCA-related cancers include:  Breast.  Ovarian.  Tubal.  Peritoneal cancers.  Results of the assessment will determine the need for genetic counseling and BRCA1 and BRCA2 testing. Cervical Cancer Routine pelvic examinations to screen for cervical cancer are no longer recommended for nonpregnant women who are considered low risk for cancer of the pelvic organs (ovaries, uterus, and vagina) and who do not have symptoms. A pelvic examination may be necessary if you have symptoms including those associated with pelvic infections. Ask your health care provider if a screening pelvic exam is right for you.   The Pap test is the screening test for cervical cancer for women who are considered at risk.  If you had a hysterectomy for a problem that was not cancer or a condition that could lead to cancer, then you no longer need Pap tests.  If you are older than 65 years, and you have had normal Pap tests for the past 10 years, you no longer need to have Pap tests.  If you have had past treatment for cervical cancer or a condition that could lead to cancer, you need Pap tests and screening for cancer for at least 20 years after your treatment.  If you no longer get a Pap test, assess your risk factors if they change (such as having a new sexual partner). This can affect whether you should start being screened again.  Some women have medical problems that increase their chance of getting cervical cancer. If this is the case for you, your health care provider may recommend more frequent screening and Pap tests.  The human papillomavirus (HPV) test is another test that may be used for cervical cancer screening. The HPV test looks for the virus that can cause cell changes in the cervix. The cells collected during the Pap test can be tested for HPV.  The HPV test can be used to screen women 84 years of age and older.  Getting tested for HPV can extend the interval between normal Pap tests from three to five years.  An HPV test also should be used to screen women of any age who have unclear Pap test results.  After 59 years of age, women should have HPV testing as often as Pap tests.  Colorectal Cancer  This type of cancer can be detected and often prevented.  Routine colorectal cancer screening usually begins at 59 years of age and continues through 59 years of age.  Your health care provider may recommend screening at an earlier age if you have risk factors for colon cancer.  Your health care provider may also recommend using home test kits to check for hidden blood in the stool.  A small camera at the end of a tube can be used to examine your colon directly (sigmoidoscopy or colonoscopy). This is  done to check for the earliest forms of colorectal cancer.  Routine screening usually begins at age 50.  Direct examination of the colon should be repeated every 5-10 years through 59 years of age. However, you may need to be screened more often if early forms of precancerous polyps or small growths are found. Skin Cancer  Check your skin from head to toe regularly.  Tell your health care provider about any new moles or changes in moles, especially if there is a change in a mole's shape or color.  Also tell your health care provider if you have a mole that is larger than the size of a pencil eraser.  Always use sunscreen. Apply sunscreen liberally and repeatedly throughout the day.  Protect yourself by wearing long sleeves, pants, a wide-brimmed hat, and sunglasses whenever you are outside. HEART DISEASE, DIABETES, AND HIGH BLOOD PRESSURE   Have your blood pressure checked at least every 1-2 years. High blood pressure causes heart disease and increases the risk of stroke.  If you are between 55 years and 79 years old, ask your health care provider if you should take aspirin to prevent  strokes.  Have regular diabetes screenings. This involves taking a blood sample to check your fasting blood sugar level.  If you are at a normal weight and have a low risk for diabetes, have this test once every three years after 59 years of age.  If you are overweight and have a high risk for diabetes, consider being tested at a younger age or more often. PREVENTING INFECTION  Hepatitis B  If you have a higher risk for hepatitis B, you should be screened for this virus. You are considered at high risk for hepatitis B if:  You were born in a country where hepatitis B is common. Ask your health care provider which countries are considered high risk.  Your parents were born in a high-risk country, and you have not been immunized against hepatitis B (hepatitis B vaccine).  You have HIV or AIDS.  You use needles to inject street drugs.  You live with someone who has hepatitis B.  You have had sex with someone who has hepatitis B.  You get hemodialysis treatment.  You take certain medicines for conditions, including cancer, organ transplantation, and autoimmune conditions. Hepatitis C  Blood testing is recommended for:  Everyone born from 1945 through 1965.  Anyone with known risk factors for hepatitis C. Sexually transmitted infections (STIs)  You should be screened for sexually transmitted infections (STIs) including gonorrhea and chlamydia if:  You are sexually active and are younger than 59 years of age.  You are older than 59 years of age and your health care provider tells you that you are at risk for this type of infection.  Your sexual activity has changed since you were last screened and you are at an increased risk for chlamydia or gonorrhea. Ask your health care provider if you are at risk.  If you do not have HIV, but are at risk, it may be recommended that you take a prescription medicine daily to prevent HIV infection. This is called pre-exposure prophylaxis  (PrEP). You are considered at risk if:  You are sexually active and do not regularly use condoms or know the HIV status of your partner(s).  You take drugs by injection.  You are sexually active with a partner who has HIV. Talk with your health care provider about whether you are at high risk of being infected   with HIV. If you choose to begin PrEP, you should first be tested for HIV. You should then be tested every 3 months for as long as you are taking PrEP.  PREGNANCY   If you are premenopausal and you may become pregnant, ask your health care provider about preconception counseling.  If you may become pregnant, take 400 to 800 micrograms (mcg) of folic acid every day.  If you want to prevent pregnancy, talk to your health care provider about birth control (contraception). OSTEOPOROSIS AND MENOPAUSE   Osteoporosis is a disease in which the bones lose minerals and strength with aging. This can result in serious bone fractures. Your risk for osteoporosis can be identified using a bone density scan.  If you are 65 years of age or older, or if you are at risk for osteoporosis and fractures, ask your health care provider if you should be screened.  Ask your health care provider whether you should take a calcium or vitamin D supplement to lower your risk for osteoporosis.  Menopause may have certain physical symptoms and risks.  Hormone replacement therapy may reduce some of these symptoms and risks. Talk to your health care provider about whether hormone replacement therapy is right for you.  HOME CARE INSTRUCTIONS   Schedule regular health, dental, and eye exams.  Stay current with your immunizations.   Do not use any tobacco products including cigarettes, chewing tobacco, or electronic cigarettes.  If you are pregnant, do not drink alcohol.  If you are breastfeeding, limit how much and how often you drink alcohol.  Limit alcohol intake to no more than 1 drink per day for  nonpregnant women. One drink equals 12 ounces of beer, 5 ounces of wine, or 1 ounces of hard liquor.  Do not use street drugs.  Do not share needles.  Ask your health care provider for help if you need support or information about quitting drugs.  Tell your health care provider if you often feel depressed.  Tell your health care provider if you have ever been abused or do not feel safe at home. Document Released: 05/25/2011 Document Revised: 03/26/2014 Document Reviewed: 10/11/2013 ExitCare Patient Information 2015 [employer]. This information is not intended to replace advice given to you by your health care provider. Make sure you discuss any questions you have with your health care provider.  

## 2015-11-06 ENCOUNTER — Telehealth: Payer: Self-pay | Admitting: Gynecology

## 2015-11-06 MED ORDER — NORETHINDRONE-ETH ESTRADIOL 1-5 MG-MCG PO TABS: 1.0000 | ORAL_TABLET | Freq: Every day | ORAL | Status: DC

## 2015-11-06 NOTE — Telephone Encounter (Signed)
I saw patient 2 days ago for her annual exam. I was under the incorrect assumption that she stopped her HRT but she has continued on this. I called her today to discuss this. She is on [facility] equivalent doing well. I again reviewed the risks to include increased risk of stroke heart attack DVT and possible breast cancer. The issues as to when to wean and current recommendations as far as lowest dose for shortest period of time discussed.  At this point the patient wants to continue and I will refill her HRT 1 year.

## 2015-11-11 ENCOUNTER — Ambulatory Visit
Admission: RE | Admit: 2015-11-11 | Discharge: 2015-11-11 | Disposition: A | Discharge status: Home / Self Care | Payer: BLUE CROSS/BLUE SHIELD | Source: Ambulatory Visit

## 2015-11-11 DIAGNOSIS — Z1231 Encounter for screening mammogram for malignant neoplasm of breast: Secondary | ICD-10-CM

## 2015-11-22 ENCOUNTER — Other Ambulatory Visit: Payer: Self-pay

## 2015-11-22 ENCOUNTER — Ambulatory Visit (INDEPENDENT_AMBULATORY_CARE_PROVIDER_SITE_OTHER): Payer: BLUE CROSS/BLUE SHIELD | Admitting: Family Medicine

## 2015-11-22 ENCOUNTER — Encounter: Payer: Self-pay | Admitting: Family Medicine

## 2015-11-22 VITALS — BP 112/74 | HR 64 | Temp 98.8°F | Ht 61.0 in | Wt 120.8 lb

## 2015-11-22 DIAGNOSIS — B9789 Other viral agents as the cause of diseases classified elsewhere: Principal | ICD-10-CM

## 2015-11-22 DIAGNOSIS — J3489 Other specified disorders of nose and nasal sinuses: Secondary | ICD-10-CM | POA: Diagnosis not present

## 2015-11-22 DIAGNOSIS — J069 Acute upper respiratory infection, unspecified: Secondary | ICD-10-CM

## 2015-11-22 DIAGNOSIS — G43109 Migraine with aura, not intractable, without status migrainosus: Secondary | ICD-10-CM

## 2015-11-22 MED ORDER — PREDNISONE 10 MG PO TABS: ORAL_TABLET | ORAL | Status: DC

## 2015-11-22 MED ORDER — NORETHINDRONE-ETH ESTRADIOL 1-5 MG-MCG PO TABS: 1.0000 | ORAL_TABLET | Freq: Every day | ORAL | Status: DC

## 2015-11-22 NOTE — Patient Instructions (Signed)
Complete steroid taper.  Continue nasal saline and guaifenesin.  Call if not improving as expected.

## 2015-11-22 NOTE — Progress Notes (Signed)
Pre visit review using our clinic review tool, if applicable. No additional management support is needed unless otherwise documented below in the visit note. 

## 2015-11-22 NOTE — Assessment & Plan Note (Signed)
Symptomatic care.  Given propensity for migraines and sinus pressure.. Will treat with sterpoid taper. NAsal steroids not helpful in past.  Continue mucolytic and nasal saline irrigation.

## 2015-11-22 NOTE — Progress Notes (Signed)
   Subjective:    Patient ID: Jennifer [name], female    DOB: [birth year], 59 y.o.   MRN: [id number]  Cough This is a new problem. The current episode started in the past 7 days. The problem has been gradually worsening. The problem occurs constantly. The cough is productive of sputum (white mucus). Associated symptoms include ear congestion, ear pain, headaches, nasal congestion and rhinorrhea. Pertinent negatives include no chest pain, fever, sore throat, shortness of breath or wheezing. Associated symptoms comments: Sinus pressure. Exacerbated by: able to sleep well at ngiht propped up. Risk factors: nonsmoker. Treatments tried:  saline rinse, guafenesin. The treatment provided moderate relief. Her past medical history is significant for asthma. There is no history of COPD, environmental allergies or pneumonia. childhood asthma   She has already had to treat migraine twice.  She states that nasal Steroid did not help much in past. Oral steroids helped her a lot in past to prevent getting in a migraine flare.  Social History /Family History/Past Medical History reviewed and updated if needed.  Review of Systems  Constitutional: Negative for fever.  HENT: Positive for ear pain and rhinorrhea. Negative for sore throat.   Respiratory: Positive for cough. Negative for shortness of breath and wheezing.   Cardiovascular: Negative for chest pain.  Allergic/Immunologic: Negative for environmental allergies.  Neurological: Positive for headaches.       Objective:   Physical Exam  Constitutional: Vital signs are normal. She appears well-developed and well-nourished. She is cooperative.  Non-toxic appearance. She does not appear ill. No distress.  HENT:  Head: Normocephalic.  Right Ear: Hearing, tympanic membrane, external ear and ear canal normal. Tympanic membrane is not erythematous, not retracted and not bulging.  Left Ear: Hearing, tympanic membrane, external ear and ear canal normal. Tympanic  membrane is not erythematous, not retracted and not bulging.  Nose: Mucosal edema and rhinorrhea present. Right sinus exhibits maxillary sinus tenderness. Right sinus exhibits no frontal sinus tenderness. Left sinus exhibits maxillary sinus tenderness. Left sinus exhibits no frontal sinus tenderness.  Mouth/Throat: Uvula is midline, oropharynx is clear and moist and mucous membranes are normal.  Eyes: Conjunctivae, EOM and lids are normal. Pupils are equal, round, and reactive to light. Lids are everted and swept, no foreign bodies found.  Neck: Trachea normal and normal range of motion. Neck supple. Carotid bruit is not present. No thyroid mass and no thyromegaly present.  Cardiovascular: Normal rate, regular rhythm, S1 normal, S2 normal, normal heart sounds, intact distal pulses and normal pulses.  Exam reveals no gallop and no friction rub.   No murmur heard. Pulmonary/Chest: Effort normal and breath sounds normal. No tachypnea. No respiratory distress. She has no decreased breath sounds. She has no wheezes. She has no rhonchi. She has no rales.  Neurological: She is alert.  Skin: Skin is warm, dry and intact. No rash noted.  Psychiatric: Her speech is normal and behavior is normal. Judgment normal. Her mood appears not anxious. Cognition and memory are normal. She does not exhibit a depressed mood.          Assessment & Plan:

## 2016-01-29 ENCOUNTER — Other Ambulatory Visit: Payer: Self-pay | Admitting: Gynecology

## 2016-03-09 ENCOUNTER — Other Ambulatory Visit: Payer: Self-pay | Admitting: Family Medicine

## 2016-03-09 NOTE — Telephone Encounter (Signed)
CPE scheduled in September.  

## 2016-03-24 ENCOUNTER — Ambulatory Visit (INDEPENDENT_AMBULATORY_CARE_PROVIDER_SITE_OTHER): Payer: BLUE CROSS/BLUE SHIELD | Admitting: Family Medicine

## 2016-03-24 ENCOUNTER — Other Ambulatory Visit: Payer: Self-pay | Admitting: Family Medicine

## 2016-03-24 ENCOUNTER — Encounter: Payer: Self-pay | Admitting: Family Medicine

## 2016-03-24 VITALS — BP 92/60 | HR 68 | Temp 98.5°F | Wt 120.0 lb

## 2016-03-24 DIAGNOSIS — R195 Other fecal abnormalities: Secondary | ICD-10-CM | POA: Insufficient documentation

## 2016-03-24 NOTE — Patient Instructions (Signed)
Go to the lab on the way out.  We'll contact you with your lab report. Take care.  Glad to see you.  Update us as needed.   

## 2016-03-24 NOTE — Assessment & Plan Note (Signed)
Nontoxic, no alarming findings on exam.  D/w pt.  Okay for outpatient f/u.  Presumed to have temporary mild malabsorption from prev ingested cookies that may have been spoiled.  This should resolve.  She agrees.   Check labs today, see orders.

## 2016-03-24 NOTE — Progress Notes (Signed)
Pre visit review using our clinic review tool, if applicable. No additional management support is needed unless otherwise documented below in the visit note.  Sx started about 2 weeks ago.  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  She had some cookies about 2 weeks ago, ate those and it had a slightly altered taste.  The next night she had loose stools.  Continued for the next few days, then the diarrhea resolved.  Then had some oily residue after BMs with some mucous with the BMs.  No blood in stools. No black stools.  She has some cramping after eating.  She has had 2 BMs per day, usually about 1 per day prev.  No worms in stool.   No other atypical foods.  No FCNAV o/w.  Had tried taking mylanta in the meantime, for some occ cramping at night, with relief.  No travel.  City water.    Meds, vitals, and allergies reviewed.   ROS: Per HPI unless specifically indicated in ROS section   GEN: nad, alert and oriented HEENT: mucous membranes moist NECK: supple w/o LA CV: rrr PULM: ctab, no inc wob ABD: soft, +bs, no ttp.   EXT: no edema

## 2016-03-28 LAB — STOOL CULTURE

## 2016-03-29 LAB — FECAL FAT, QUALITATIVE: FECAL FAT QUALITATIVE: NORMAL

## 2016-03-30 LAB — C. DIFFICILE GDH AND TOXIN A/B

## 2016-05-08 ENCOUNTER — Other Ambulatory Visit: Payer: Self-pay

## 2016-05-08 MED ORDER — ALPRAZOLAM 0.5 MG PO TABS: ORAL_TABLET | ORAL | Status: DC

## 2016-05-08 NOTE — Telephone Encounter (Signed)
Medication phoned to [facility] at [place] as instructed. Pt notified done and pt voiced understanding.

## 2016-05-08 NOTE — Telephone Encounter (Signed)
Approved: #[phone] x 0

## 2016-05-08 NOTE — Telephone Encounter (Signed)
Pt left v/m requesting refill alprazolam to [facility]; last refilled # 15 x 3 on 09/06/15. No recent f/u appt, last acute visit 03/24/16 and CPX scheduled on 08/03/16.Please advise. Dr Glori Bickers out of office and limited access to computer.

## 2016-06-29 DIAGNOSIS — G43019 Migraine without aura, intractable, without status migrainosus: Secondary | ICD-10-CM | POA: Diagnosis not present

## 2016-07-22 DIAGNOSIS — H52223 Regular astigmatism, bilateral: Secondary | ICD-10-CM | POA: Diagnosis not present

## 2016-07-23 ENCOUNTER — Telehealth: Payer: Self-pay | Admitting: Family Medicine

## 2016-07-23 DIAGNOSIS — Z0000 Encounter for general adult medical examination without abnormal findings: Secondary | ICD-10-CM

## 2016-07-23 NOTE — Telephone Encounter (Signed)
-----   Message from Ellamae Sia sent at 07/21/2016  4:12 PM EDT ----- Regarding: Lab orders for  Wednesday, 9.6.17 Patient is scheduled for CPX labs, please order future labs, Thanks , [name]

## 2016-07-28 ENCOUNTER — Telehealth: Payer: Self-pay | Admitting: Family Medicine

## 2016-07-28 DIAGNOSIS — R7303 Prediabetes: Secondary | ICD-10-CM | POA: Insufficient documentation

## 2016-07-28 DIAGNOSIS — Z131 Encounter for screening for diabetes mellitus: Secondary | ICD-10-CM

## 2016-07-28 NOTE — Telephone Encounter (Signed)
I added the A1C

## 2016-07-28 NOTE — Telephone Encounter (Signed)
Patient is having lab work done tomorrow. Patient wants to have her A1c checked.  Patient said she has a strong family history,she has gained weight and she's hispanic.  She has had pre- diabatic readings. Patient spoke to her insurance and they need to be coded either cpt 82947 and z13.1 or hcpcs [phone].

## 2016-07-29 ENCOUNTER — Other Ambulatory Visit (INDEPENDENT_AMBULATORY_CARE_PROVIDER_SITE_OTHER): Payer: BLUE CROSS/BLUE SHIELD

## 2016-07-29 DIAGNOSIS — Z131 Encounter for screening for diabetes mellitus: Secondary | ICD-10-CM

## 2016-07-29 DIAGNOSIS — Z0000 Encounter for general adult medical examination without abnormal findings: Secondary | ICD-10-CM | POA: Diagnosis not present

## 2016-07-29 LAB — LIPID PANEL
Cholesterol: 178 mg/dL (ref 0–200)
HDL: 59 mg/dL (ref >39.00)
LDL Cholesterol: 102 mg/dL — ABNORMAL HIGH (ref 0–99)
NONHDL: 119.12
TRIGLYCERIDES: 88 mg/dL (ref 0.0–149.0)
Total CHOL/HDL Ratio: 3
VLDL: 17.6 mg/dL (ref 0.0–40.0)

## 2016-07-29 LAB — HEMOGLOBIN A1C: Hgb A1c MFr Bld: 5.7 % (ref 4.6–6.5)

## 2016-07-29 LAB — CBC WITH DIFFERENTIAL/PLATELET
Basophils Absolute: 0 10*3/uL (ref 0.0–0.1)
Basophils Relative: 0.8 % (ref 0.0–3.0)
Eosinophils Absolute: 0.1 10*3/uL (ref 0.0–0.7)
Eosinophils Relative: 1.8 % (ref 0.0–5.0)
HCT: 37.7 % (ref 36.0–46.0)
HEMOGLOBIN: 12.9 g/dL (ref 12.0–15.0)
LYMPHS PCT: 33.5 % (ref 12.0–46.0)
Lymphs Abs: 2 10*3/uL (ref 0.7–4.0)
MCHC: 34.3 g/dL (ref 30.0–36.0)
MCV: 90.2 fl (ref 78.0–100.0)
MONOS PCT: 10.2 % (ref 3.0–12.0)
Monocytes Absolute: 0.6 10*3/uL (ref 0.1–1.0)
Neutro Abs: 3.3 10*3/uL (ref 1.4–7.7)
Neutrophils Relative %: 53.7 % (ref 43.0–77.0)
Platelets: 268 10*3/uL (ref 150.0–400.0)
RBC: 4.18 Mil/uL (ref 3.87–5.11)
RDW: 12.3 % (ref 11.5–15.5)
WBC: 6.1 10*3/uL (ref 4.0–10.5)

## 2016-07-29 LAB — COMPREHENSIVE METABOLIC PANEL
ALK PHOS: 53 U/L (ref 39–117)
ALT: 13 U/L (ref 0–35)
AST: 19 U/L (ref 0–37)
Albumin: 4.1 g/dL (ref 3.5–5.2)
BILIRUBIN TOTAL: 0.6 mg/dL (ref 0.2–1.2)
BUN: 15 mg/dL (ref 6–23)
CO2: 29 mEq/L (ref 19–32)
CREATININE: 0.76 mg/dL (ref 0.40–1.20)
Calcium: 8.9 mg/dL (ref 8.4–10.5)
Chloride: 102 mEq/L (ref 96–112)
GFR: 82.56 mL/min (ref >60.00)
Glucose, Bld: 103 mg/dL — ABNORMAL HIGH (ref 70–99)
Potassium: 3.9 mEq/L (ref 3.5–5.1)
Sodium: 137 mEq/L (ref 135–145)
TOTAL PROTEIN: 6.9 g/dL (ref 6.0–8.3)

## 2016-07-29 NOTE — Telephone Encounter (Signed)
I used Z00.0 - which is the code for health mt exam (that is a preventative visit)- does that count ? Who do I need to ask?  For the A1C I used a code she gave me for diabetes screening  Thanks  I will route to [name]- perhaps she will be able to tell me who to ask

## 2016-07-29 NOTE — Telephone Encounter (Signed)
Pt had labs done today wanted to make they were coded preventative Code cpt [id number] and  z13.1 or hcpcs [id number] Her insurance will pay 100% for preventative

## 2016-07-29 NOTE — Telephone Encounter (Signed)
Rose- is Z00.0 a screening dx?   (it is for health maintenance examination) -- I would assume health mt means preventative ?   Thanks   Is there a way to know if what I ordered will be correct for her insurance?

## 2016-07-29 NOTE — Telephone Encounter (Signed)
Per Rose in billing, any test you do such as, A1C  And the patient doesn't have a reason needs to have a "screening dx"

## 2016-07-30 DIAGNOSIS — H11003 Unspecified pterygium of eye, bilateral: Secondary | ICD-10-CM | POA: Diagnosis not present

## 2016-07-30 LAB — TSH: TSH: 3.71 u[IU]/mL (ref 0.35–4.50)

## 2016-07-30 NOTE — Telephone Encounter (Signed)
Thank you :)

## 2016-07-30 NOTE — Telephone Encounter (Signed)
Dr. Glori Bickers, Z00,00 is for the health maintenance examination and is ok to use on CPE labs.  You used the [id number] CPT w/a dx of Z13.1, which is what the patient requested.  Since it's already gone to insurance, I would suggest leaving it the way it is and if insurance denies, I can have the dx changed to Z00.00.  But she said per her insurance to use [phone] and Z13.1 so if she understood correctly it should be paid as it is.  Hope this helps!  Thank you!

## 2016-08-03 ENCOUNTER — Ambulatory Visit (INDEPENDENT_AMBULATORY_CARE_PROVIDER_SITE_OTHER): Payer: BLUE CROSS/BLUE SHIELD | Admitting: Family Medicine

## 2016-08-03 ENCOUNTER — Encounter: Payer: Self-pay | Admitting: Family Medicine

## 2016-08-03 VITALS — BP 104/62 | HR 62 | Temp 98.2°F | Ht 60.5 in | Wt 121.0 lb

## 2016-08-03 DIAGNOSIS — M8580 Other specified disorders of bone density and structure, unspecified site: Secondary | ICD-10-CM

## 2016-08-03 DIAGNOSIS — Z13220 Encounter for screening for lipoid disorders: Secondary | ICD-10-CM | POA: Diagnosis not present

## 2016-08-03 DIAGNOSIS — Z23 Encounter for immunization: Secondary | ICD-10-CM

## 2016-08-03 DIAGNOSIS — Z131 Encounter for screening for diabetes mellitus: Secondary | ICD-10-CM | POA: Diagnosis not present

## 2016-08-03 DIAGNOSIS — E039 Hypothyroidism, unspecified: Secondary | ICD-10-CM | POA: Diagnosis not present

## 2016-08-03 DIAGNOSIS — Z0000 Encounter for general adult medical examination without abnormal findings: Secondary | ICD-10-CM

## 2016-08-03 MED ORDER — PANTOPRAZOLE SODIUM 40 MG PO TBEC: DELAYED_RELEASE_TABLET | ORAL | 11 refills | Status: DC

## 2016-08-03 MED ORDER — LEVOTHYROXINE SODIUM 75 MCG PO TABS: 75.0000 ug | ORAL_TABLET | Freq: Every day | ORAL | 11 refills | Status: DC

## 2016-08-03 NOTE — Progress Notes (Signed)
Subjective:    Patient ID: Jennifer [name], female    DOB: 20-May-1956, 60 y.o.   MRN: [id number]  HPI Here for health maintenance exam and to review chronic medical problems    Wt Readings from Last 3 Encounters:  08/03/16 121 lb (54.9 kg)  03/24/16 120 lb (54.4 kg)  11/22/15 120 lb 12 oz (54.8 kg)  good weight and bmi  She is mindful of diet and good habits  bmi is 23.24  Hep C/HIV screening  Not interested due to low risk   Colonoscopy 2/05 She is interested in cologard/not sure? - will do research and let us know   Flu vaccine -will get today   Pap 12/16 normal with (neg HPV here in 2014)  Had conization in the 1980s No vaginal bleeding Sees gyn every year   Mammogram 12/16= breast center  Mother had hx of breast cancer  Self breast exam - at one time she felt a lump in L axilla - thinks it may be gone  Has hx of scar tissue  Has gyn appt in December   Hx of osteopenia  dexa 12/15 Will be due for 2 y check in Dec  She is bad about taking ca and D Eats greek yogurt twice daily  No falls or fractures   Results for orders placed or performed in visit on 07/29/16  CBC with Differential/Platelet  Result Value Ref Range   WBC 6.1 4.0 - 10.5 K/uL   RBC 4.18 3.87 - 5.11 Mil/uL   Hemoglobin 12.9 12.0 - 15.0 g/dL   HCT 37.7 36.0 - 46.0 %   MCV 90.2 78.0 - 100.0 fl   MCHC 34.3 30.0 - 36.0 g/dL   RDW 12.3 11.5 - 15.5 %   Platelets 268.0 150.0 - 400.0 K/uL   Neutrophils Relative % 53.7 43.0 - 77.0 %   Lymphocytes Relative 33.5 12.0 - 46.0 %   Monocytes Relative 10.2 3.0 - 12.0 %   Eosinophils Relative 1.8 0.0 - 5.0 %   Basophils Relative 0.8 0.0 - 3.0 %   Neutro Abs 3.3 1.4 - 7.7 K/uL   Lymphs Abs 2.0 0.7 - 4.0 K/uL   Monocytes Absolute 0.6 0.1 - 1.0 K/uL   Eosinophils Absolute 0.1 0.0 - 0.7 K/uL   Basophils Absolute 0.0 0.0 - 0.1 K/uL  Comprehensive metabolic panel  Result Value Ref Range   Sodium 137 135 - 145 mEq/L   Potassium 3.9 3.5 - 5.1 mEq/L   Chloride 102  96 - 112 mEq/L   CO2 29 19 - 32 mEq/L   Glucose, Bld 103 (H) 70 - 99 mg/dL   BUN 15 6 - 23 mg/dL   Creatinine, Ser 0.76 0.40 - 1.20 mg/dL   Total Bilirubin 0.6 0.2 - 1.2 mg/dL   Alkaline Phosphatase 53 39 - 117 U/L   AST 19 0 - 37 U/L   ALT 13 0 - 35 U/L   Total Protein 6.9 6.0 - 8.3 g/dL   Albumin 4.1 3.5 - 5.2 g/dL   Calcium 8.9 8.4 - 10.5 mg/dL   GFR 82.56 >60.00 mL/min  Lipid panel  Result Value Ref Range   Cholesterol 178 0 - 200 mg/dL   Triglycerides 88.0 0.0 - 149.0 mg/dL   HDL 59.00 >39.00 mg/dL   VLDL 17.6 0.0 - 40.0 mg/dL   LDL Cholesterol 102 (H) 0 - 99 mg/dL   Total CHOL/HDL Ratio 3    NonHDL 119.12   TSH  Result Value Ref Range  TSH 3.71 0.35 - 4.50 uIU/mL  Hemoglobin A1c  Result Value Ref Range   Hgb A1c MFr Bld 5.7 4.6 - 6.5 %    Continues to watch her diet for strong family history  Has not been exercising   Tetanus shot 12/13    Patient Active Problem List   Diagnosis Date Noted  . Screening for diabetes mellitus 07/28/2016  . Allergic rhinitis 11/13/2014  . Adenopathy, cervical 10/30/2014  . Screening for lipoid disorders 09/26/2014  . Preventative health care 09/26/2014  . Routine general medical examination at a health care facility 10/28/2012  . Recurrent cold sores 04/28/2011  . GERD 09/16/2010  . Hypothyroidism 05/07/2008  . Osteopenia 05/07/2008  . Migraine with aura 11/11/2007   Past Medical History:  Diagnosis Date  . Allergic rhinitis   . Cervical dysplasia   . Endometriosis   . Esophageal reflux   . Hypothyroid   . Migraine with aura, without mention of intractable migraine without mention of status migrainosus   . Osteopenia 10/2014   T score -1.9 FRAX 7%/0.7%   Past Surgical History:  Procedure Laterality Date  . BREAST SURGERY     Benign breast lump excised  . CERVICAL CONE BIOPSY  1985   severe dysplasia  . COLONOSCOPY  2005   normal  . COLPOSCOPY    . ROTATOR CUFF REPAIR  08/2008   Social History  Substance Use  Topics  . Smoking status: Never Smoker  . Smokeless tobacco: Never Used  . Alcohol use No   Family History  Problem Relation Age of Onset  . Breast cancer Mother 14  . Diabetes Mother   . Hypertension Father   . Heart disease Father   . Osteoporosis Maternal Grandmother   . Diabetes Maternal Grandmother    Allergies  Allergen Reactions  . Codeine Phosphate Other (See Comments)    Dizziness, heart palpitations, nervous  . Flonase [Fluticasone Propionate] Other (See Comments)    Worsens her migraine   . Hydrocodone     REACTION: made nervous--10/09 surgery rotator cuff   Current Outpatient Prescriptions on File Prior to Visit  Medication Sig Dispense Refill  . ALPRAZolam (XANAX) 0.5 MG tablet TAKE 1/2 TO 1 TABLET BY MOUTH AS NEED FOR SLEEP OR ANXIETY 15 tablet 0  . atenolol (TENORMIN) 25 MG tablet Take 12.5 mg by mouth 3 (three) times daily.     . baclofen (LIORESAL) 10 MG tablet Take 10 mg by mouth as needed. For migraine prevention     . fish oil-omega-3 fatty acids 1000 MG capsule Take 1 g by mouth daily.      Marland Kitchen JINTELI 1-5 MG-MCG TABS tablet TAKE 1 TABLET BY MOUTH DAILY. 84 tablet 3  . ketorolac (TORADOL) 10 MG tablet Take 10 mg by mouth as needed. With food for Migraine    . loratadine (CLARITIN) 10 MG tablet Take 10 mg by mouth daily.    . nabumetone (RELAFEN) 500 MG tablet Take 500 mg by mouth daily as needed.     . promethazine (PHENERGAN) 25 MG tablet Take 25 mg by mouth as needed. When having a migraine    . rizatriptan (MAXALT) 10 MG tablet Take 10 mg by mouth as needed. May repeat in 2 hours if needed     No current facility-administered medications on file prior to visit.     Review of Systems    Review of Systems  Constitutional: Negative for fever, appetite change, fatigue and unexpected weight change.  Eyes: Negative  for pain and visual disturbance.  Respiratory: Negative for cough and shortness of breath.   Cardiovascular: Negative for cp or palpitations      Gastrointestinal: Negative for nausea, diarrhea and constipation.  Genitourinary: Negative for urgency and frequency.  Skin: Negative for pallor or rash   Neurological: Negative for weakness, light-headedness, numbness and headaches.  Hematological: Negative for adenopathy. Does not bruise/bleed easily.  Psychiatric/Behavioral: Negative for dysphoric mood. The patient is not nervous/anxious.      Objective:   Physical Exam  Constitutional: She appears well-developed and well-nourished. No distress.  Well appearing   HENT:  Head: Normocephalic and atraumatic.  Right Ear: External ear normal.  Left Ear: External ear normal.  Mouth/Throat: Oropharynx is clear and moist.  Eyes: Conjunctivae and EOM are normal. Pupils are equal, round, and reactive to light. No scleral icterus.  Neck: Normal range of motion. Neck supple. No JVD present. Carotid bruit is not present. No thyromegaly present.  Cardiovascular: Normal rate, regular rhythm, normal heart sounds and intact distal pulses.  Exam reveals no gallop.   Pulmonary/Chest: Effort normal and breath sounds normal. No respiratory distress. She has no wheezes. She exhibits no tenderness.  Abdominal: Soft. Bowel sounds are normal. She exhibits no distension, no abdominal bruit and no mass. There is no tenderness.  Genitourinary: No breast swelling, tenderness, discharge or bleeding.  Genitourinary Comments: Breast exam: No mass, nodules, thickening, tenderness, bulging, retraction, inflamation, nipple discharge or skin changes noted.  No axillary or clavicular LA.      Musculoskeletal: Normal range of motion. She exhibits no edema or tenderness.  Lymphadenopathy:    She has no cervical adenopathy.  Neurological: She is alert. She has normal reflexes. No cranial nerve deficit. She exhibits normal muscle tone. Coordination normal.  Skin: Skin is warm and dry. No rash noted. No erythema. No pallor.  Few brown nevi  Psychiatric: She has a normal  mood and affect.          Assessment & Plan:   Problem List Items Addressed This Visit      Endocrine   Hypothyroidism    Hypothyroidism  Pt has no clinical changes No change in energy level/ hair or skin/ edema and no tremor Lab Results  Component Value Date   TSH 3.71 07/29/2016           Relevant Medications   levothyroxine (SYNTHROID, LEVOTHROID) 75 MCG tablet     Musculoskeletal and Integument   Osteopenia    Pt will be due for 2 y dexa in Dec-she will discuss with her gyn  Good diet  Enc to take vit D daily  No falls or fx On PPI  Disc need for calcium/ vitamin D/ wt bearing exercise and bone density test every 2 y to monitor Disc safety/ fracture risk in detail          Other   Screening for lipoid disorders    Good profile Disc goals for lipids and reasons to control them Rev labs with pt Rev low sat fat diet in detail       Screening for diabetes mellitus - Primary    Lab Results  Component Value Date   HGBA1C 5.7 07/29/2016   Pt continues to watch carbs in diet  Enc her to start an exercise program       Routine general medical examination at a health care facility    Reviewed health habits including diet and exercise and skin cancer prevention Reviewed appropriate screening tests  for age  Also reviewed health mt list, fam hx and immunization status , as well as social and family history   Labs reviewed See HPI Flu shot today  Medications refilled She will f/u with gyn annually -due in Dec       Other Visit Diagnoses    Need for influenza vaccination       Relevant Orders   Flu Vaccine QUAD 36+ mos IM (Completed)

## 2016-08-03 NOTE — Patient Instructions (Addendum)
Options for colon cancer screening include the cologuard (you must call insurance to see if it is covered) and colonoscopy -which is also preventative because it removes the potential for cancer Let us know what you decide  Don't forget to take your vitamin D Keep watching carbs in your diet Also think about an exercise program   Flu shot today  Take care of yourself

## 2016-08-03 NOTE — Assessment & Plan Note (Signed)
Reviewed health habits including diet and exercise and skin cancer prevention Reviewed appropriate screening tests for age  Also reviewed health mt list, fam hx and immunization status , as well as social and family history   Labs reviewed See HPI Flu shot today  Medications refilled She will f/u with gyn annually -due in Dec

## 2016-08-03 NOTE — Assessment & Plan Note (Signed)
Hypothyroidism  Pt has no clinical changes No change in energy level/ hair or skin/ edema and no tremor Lab Results  Component Value Date   TSH 3.71 07/29/2016

## 2016-08-03 NOTE — Assessment & Plan Note (Signed)
Pt will be due for 2 y dexa in Dec-she will discuss with her gyn  Good diet  Enc to take vit D daily  No falls or fx On PPI  Disc need for calcium/ vitamin D/ wt bearing exercise and bone density test every 2 y to monitor Disc safety/ fracture risk in detail

## 2016-08-03 NOTE — Progress Notes (Signed)
Pre visit review using our clinic review tool, if applicable. No additional management support is needed unless otherwise documented below in the visit note. 

## 2016-08-03 NOTE — Assessment & Plan Note (Signed)
Good profile Disc goals for lipids and reasons to control them Rev labs with pt Rev low sat fat diet in detail

## 2016-08-03 NOTE — Assessment & Plan Note (Signed)
Lab Results  Component Value Date   HGBA1C 5.7 07/29/2016   Pt continues to watch carbs in diet  Enc her to start an exercise program

## 2016-10-01 ENCOUNTER — Other Ambulatory Visit: Payer: Self-pay

## 2016-10-01 MED ORDER — ALPRAZOLAM 0.5 MG PO TABS: ORAL_TABLET | ORAL | 0 refills | Status: DC

## 2016-10-01 NOTE — Telephone Encounter (Signed)
Pt left v/m requesting refill xanax to [facility]. Last refilled # 15 on 05/08/16. Last annual exam on 08/03/16.

## 2016-10-01 NOTE — Telephone Encounter (Signed)
Px written for call in   

## 2016-10-01 NOTE — Telephone Encounter (Signed)
Rx called in as prescribed to the [facility]

## 2016-10-08 ENCOUNTER — Other Ambulatory Visit: Payer: Self-pay | Admitting: Family Medicine

## 2016-10-09 ENCOUNTER — Other Ambulatory Visit: Payer: Self-pay | Admitting: Gynecology

## 2016-10-09 DIAGNOSIS — Z1231 Encounter for screening mammogram for malignant neoplasm of breast: Secondary | ICD-10-CM

## 2016-11-10 ENCOUNTER — Ambulatory Visit (INDEPENDENT_AMBULATORY_CARE_PROVIDER_SITE_OTHER): Payer: BLUE CROSS/BLUE SHIELD | Admitting: Gynecology

## 2016-11-10 ENCOUNTER — Encounter: Payer: Self-pay | Admitting: Gynecology

## 2016-11-10 VITALS — BP 112/64 | Ht 62.0 in | Wt 124.0 lb

## 2016-11-10 DIAGNOSIS — Z01411 Encounter for gynecological examination (general) (routine) with abnormal findings: Secondary | ICD-10-CM | POA: Diagnosis not present

## 2016-11-10 DIAGNOSIS — Z79890 Hormone replacement therapy (postmenopausal): Secondary | ICD-10-CM

## 2016-11-10 DIAGNOSIS — N952 Postmenopausal atrophic vaginitis: Secondary | ICD-10-CM | POA: Diagnosis not present

## 2016-11-10 DIAGNOSIS — M8580 Other specified disorders of bone density and structure, unspecified site: Secondary | ICD-10-CM

## 2016-11-10 MED ORDER — NORETHINDRONE-ETH ESTRADIOL 1-5 MG-MCG PO TABS: 1.0000 | ORAL_TABLET | Freq: Every day | ORAL | 4 refills | Status: DC

## 2016-11-10 NOTE — Progress Notes (Signed)
    Jennifer [name] 17-Apr-1956 [id number]        60 y.o.  G0P0 for annual exam.    Past medical history,surgical history, problem list, medications, allergies, family history and social history were all reviewed and documented as reviewed in the EPIC chart.  ROS:  Performed with pertinent positives and negatives included in the history, assessment and plan.   Additional significant findings :  None   Exam: Caryn Bee assistant Vitals:   11/10/16 1531  BP: 112/64  Weight: 124 lb (56.2 kg)  Height: 5\' 2"  (1.575 m)   Body mass index is 22.68 kg/m.  General appearance:  Normal affect, orientation and appearance. Skin: Grossly normal HEENT: Without gross lesions.  No cervical or supraclavicular adenopathy. Thyroid normal.  Lungs:  Clear without wheezing, rales or rhonchi Cardiac: RR, without RMG Abdominal:  Soft, nontender, without masses, guarding, rebound, organomegaly or hernia Breasts:  Examined lying and sitting without masses, retractions, discharge or axillary adenopathy. Pelvic:  Ext, BUS, Vagina with atrophic changes  Cervix with atrophic changes  Uterus anteverted, normal size, shape and contour, midline and mobile nontender   Adnexa without masses or tenderness    Anus and perineum normal   Rectovaginal normal sphincter tone without palpated masses or tenderness.    Assessment/Plan:  60 y.o. G0P0 female for annual exam.   1. Postmenopausal/atrophic genital changes. Patient continues to use FemHRT equivalent. I reviewed the latest 2017 NAMS guidelines with her to include the risks of stroke heart attack DVT and breast cancer. I discussed the benefits to include symptom relief impossible cardiovascular/bone health if started early. At this point the patient wants to continue and I refilled her 1 year. 2. Osteopenia. DEXA 10/2014 T score -1.9 FRAX 17%/0.7%. Plan DEXA now a 2 year interval and patient will schedule. Calcium/vitamin D reviewed. 3. Herpes labialis. Had used  Valtrex intermittently in the past but has not had an outbreak this past year. Will call if she needs refills. 4. Mammography 10/2015. Due now and patient will schedule. SBE monthly reviewed. 5. Colonoscopy 2005. I again reviewed current recommendations to schedule. Benefits of precancerous polyp removal and early detection discussed. Patient agrees to call and schedule this coming year. 6. Pap smear/HPV 2014 negative. No Pap smear done today. 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 Normal Pap smears since. Plan repeat Pap smear at 5 year interval per current screening guidelines. 7. Health maintenance. No routine lab work done as patient reports this is done elsewhere. Follow up 1 year, sooner as needed.   Anastasio Auerbach MD, 4:02 PM 11/10/2016

## 2016-11-10 NOTE — Patient Instructions (Signed)
Follow-up for the bone density as scheduled. 

## 2016-11-17 DIAGNOSIS — M791 Myalgia: Secondary | ICD-10-CM | POA: Diagnosis not present

## 2016-11-17 DIAGNOSIS — M542 Cervicalgia: Secondary | ICD-10-CM | POA: Diagnosis not present

## 2016-11-17 DIAGNOSIS — G518 Other disorders of facial nerve: Secondary | ICD-10-CM | POA: Diagnosis not present

## 2016-11-17 DIAGNOSIS — G43019 Migraine without aura, intractable, without status migrainosus: Secondary | ICD-10-CM | POA: Diagnosis not present

## 2016-11-20 ENCOUNTER — Ambulatory Visit
Admission: RE | Admit: 2016-11-20 | Discharge: 2016-11-20 | Disposition: A | Discharge status: Home / Self Care | Payer: BLUE CROSS/BLUE SHIELD | Source: Ambulatory Visit | Attending: Gynecology | Admitting: Gynecology

## 2016-11-20 DIAGNOSIS — Z1231 Encounter for screening mammogram for malignant neoplasm of breast: Secondary | ICD-10-CM | POA: Diagnosis not present

## 2016-11-24 ENCOUNTER — Telehealth: Payer: Self-pay | Admitting: Family Medicine

## 2016-11-24 NOTE — Telephone Encounter (Signed)
Patient Name: Jennifer [name] DOB: 1956/07/19 Initial Comment pinching feeling in middle of chest, comes/goes, has history of gerd, antacid is not working Nurse Assessment Nurse: Vallery Sa, RN, [facility] [place] Date/Time (Eastern Time): 11/24/2016 10:40:51 AM Confirm and document reason for call. If symptomatic, describe symptoms. ---Caller states she developed a pinching pain in the middle of chest this morning (rated as a 2 on the 1 to 10 scale). She has a history of GERD and taking an antacid has not helped. No severe breathing difficulty. No injury. Alert and responsive. Does the patient have any new or worsening symptoms? ---Yes Will a triage be completed? ---Yes Related visit to physician within the last 2 weeks? ---No Does the PT have any chronic conditions? (i.e. diabetes, asthma, etc.) ---Yes List chronic conditions. ---GERD, Hypothyroidism, Allergies, Pre-Diabetes Is this a behavioral health or substance abuse call? ---No Guidelines Guideline Title Affirmed Question Affirmed Notes Chest Pain [1] Intermittent chest pain or "angina" AND [2] increasing in severity or frequency (Exception: pains lasting a few seconds) Final Disposition User Go to ED Now Vallery Sa, RN, [facility] - ED Disagree/Comply: Comply

## 2016-11-24 NOTE — Telephone Encounter (Signed)
Aware-I will watch out for records

## 2016-11-24 NOTE — Telephone Encounter (Signed)
Refer to [facility] note- patient sent to ER due to acute chest pain symptoms.

## 2016-11-25 ENCOUNTER — Ambulatory Visit: Payer: BLUE CROSS/BLUE SHIELD | Admitting: Internal Medicine

## 2016-11-25 ENCOUNTER — Encounter: Payer: Self-pay | Admitting: Family Medicine

## 2016-11-25 ENCOUNTER — Ambulatory Visit (INDEPENDENT_AMBULATORY_CARE_PROVIDER_SITE_OTHER): Payer: BLUE CROSS/BLUE SHIELD | Admitting: Family Medicine

## 2016-11-25 VITALS — BP 122/62 | HR 66 | Temp 98.6°F | Wt 123.5 lb

## 2016-11-25 DIAGNOSIS — K219 Gastro-esophageal reflux disease without esophagitis: Secondary | ICD-10-CM

## 2016-11-25 LAB — H. PYLORI ANTIBODY, IGG: H PYLORI IGG: NEGATIVE

## 2016-11-25 NOTE — Progress Notes (Signed)
Subjective:   Patient ID: Jennifer [name], female    DOB: 1956-01-11, 61 y.o.   MRN: [id number]  Charika Schiele is a pleasant 61 y.o. year old female pt of Dr. Glori Bickers, who presents to clinic today with Gastroesophageal Reflux  on 11/25/2016  HPI: GERD- has been taking protonix 40 mg daily for years. Has not had an endoscopy in many years. Not having difficulty swallowing foods. No black stools.  Noticing more reflux symptoms over the past couple of months despite taking her PPI daily.  Does not smoke.  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   Current Outpatient Prescriptions on File Prior to Visit  Medication Sig Dispense Refill  . ALPRAZolam (XANAX) 0.5 MG tablet TAKE 1/2 TO 1 TABLET BY MOUTH AS NEED FOR SLEEP OR ANXIETY 15 tablet 0  . atenolol (TENORMIN) 25 MG tablet Take 12.5 mg by mouth 3 (three) times daily.     . baclofen (LIORESAL) 10 MG tablet Take 10 mg by mouth as needed. For migraine prevention     . fish oil-omega-3 fatty acids 1000 MG capsule Take 1 g by mouth daily.      Marland Kitchen ketorolac (TORADOL) 10 MG tablet Take 10 mg by mouth as needed. With food for Migraine    . levothyroxine (SYNTHROID, LEVOTHROID) 75 MCG tablet Take 1 tablet (75 mcg total) by mouth daily. 30 tablet 11  . loratadine (CLARITIN) 10 MG tablet Take 10 mg by mouth daily.    . nabumetone (RELAFEN) 500 MG tablet Take 500 mg by mouth daily as needed.     . norethindrone-ethinyl estradiol (JINTELI) 1-5 MG-MCG TABS tablet Take 1 tablet by mouth daily. 84 tablet 4  . pantoprazole (PROTONIX) 40 MG tablet TAKE 1 TABLET (40 MG TOTAL) BY MOUTH DAILY. 30 tablet 11  . promethazine (PHENERGAN) 25 MG tablet Take 25 mg by mouth as needed. When having a migraine    . rizatriptan (MAXALT) 10 MG tablet Take 10 mg by mouth as needed. May repeat in 2 hours if needed     No current facility-administered medications on file prior to visit.     Allergies  Allergen Reactions  . Codeine Phosphate Other (See Comments)    Dizziness,  heart palpitations, nervous  . Flonase [Fluticasone Propionate] Other (See Comments)    Worsens her migraine   . Hydrocodone     REACTION: made nervous--10/09 surgery rotator cuff    Past Medical History:  Diagnosis Date  . Allergic rhinitis   . Cervical dysplasia   . Endometriosis   . Esophageal reflux   . Hypothyroid   . Migraine with aura, without mention of intractable migraine without mention of status migrainosus   . Osteopenia 10/2014   T score -1.9 FRAX 7%/0.7%    Past Surgical History:  Procedure Laterality Date  . BREAST SURGERY     Benign breast lump excised  . CERVICAL CONE BIOPSY  1985   severe dysplasia  . COLONOSCOPY  2005   normal  . COLPOSCOPY    . ROTATOR CUFF REPAIR  08/2008    Family History  Problem Relation Age of Onset  . Breast cancer Mother 50  . Diabetes Mother   . Hypertension Father   . Heart disease Father   . Osteoporosis Maternal Grandmother   . Diabetes Maternal Grandmother   . Hypertension Sister     Social History   Social History  . Marital status: Married    Spouse name: N/A  . Number of children:  N/A  . 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 Smoking status: Never Smoker  . Smokeless tobacco: Never Used  . Alcohol use No  . Drug use: No  . Sexual activity: Not Currently    Birth control/ protection: Post-menopausal     Comment: 1st intercourse 62 yo-5 partners   Other Topics Concern  . Not on file   Social History Narrative   [place] her husband in 9/14   The PMH, PSH, Social History, Family History, Medications, and allergies have been reviewed in [facility], and have been updated if relevant.   Review of Systems  Constitutional: Negative.   Respiratory: Negative.   Cardiovascular: Negative.   Gastrointestinal: Negative for blood in stool, diarrhea, nausea, rectal pain and vomiting.       +GERD  All other systems reviewed and are negative.        Objective:    BP 122/62   Pulse 66   Temp 98.6 F (37 C) (Oral)   Wt 123 lb 8 oz (56 kg)   SpO2 99%   BMI 22.59 kg/m    Physical Exam  Constitutional: She is oriented to person, place, and time. She appears well-developed and well-nourished. No distress.  HENT:  Head: Normocephalic.  Eyes: Conjunctivae are normal.  Cardiovascular: Normal rate.   Pulmonary/Chest: Effort normal.  Abdominal: Soft. She exhibits no distension. There is no tenderness. There is no rebound.  Musculoskeletal: Normal range of motion.  Neurological: She is alert and oriented to person, place, and time. No cranial nerve deficit.  Skin: Skin is warm and dry. She is not diaphoretic.  Psychiatric: She has a normal mood and affect. Her behavior is normal. Judgment and thought content normal.  Nursing note and vitals reviewed.         Assessment & Plan:   No diagnosis found. No Follow-up on file.

## 2016-11-25 NOTE — Assessment & Plan Note (Signed)
Deteriorated. >25 minutes spent in face to face time with patient, >50% spent in counselling or coordination of care Add H2 blocker to PPI- advised Pepcid 20 mg daily. Refer to GI for endoscopy. H Pylori screen today. The patient indicates understanding of these issues and agrees with the plan.

## 2016-11-25 NOTE — Patient Instructions (Signed)
Nice to  Meet you. Happy New Year!  Please start Pepcid 20 mg daily for 2 weeks.  Please stop by to see Rosaria Ferries on your way out.

## 2016-11-25 NOTE — Progress Notes (Signed)
Pre visit review using our clinic review tool, if applicable. No additional management support is needed unless otherwise documented below in the visit note. 

## 2016-11-26 ENCOUNTER — Encounter: Payer: Self-pay | Admitting: *Deleted

## 2016-12-02 ENCOUNTER — Ambulatory Visit: Payer: Self-pay | Admitting: Gastroenterology

## 2016-12-07 ENCOUNTER — Other Ambulatory Visit: Payer: Self-pay | Admitting: *Deleted

## 2016-12-08 ENCOUNTER — Ambulatory Visit (INDEPENDENT_AMBULATORY_CARE_PROVIDER_SITE_OTHER): Payer: BLUE CROSS/BLUE SHIELD

## 2016-12-08 ENCOUNTER — Other Ambulatory Visit: Payer: Self-pay | Admitting: Gynecology

## 2016-12-08 DIAGNOSIS — M8589 Other specified disorders of bone density and structure, multiple sites: Secondary | ICD-10-CM | POA: Diagnosis not present

## 2016-12-08 DIAGNOSIS — M899 Disorder of bone, unspecified: Secondary | ICD-10-CM

## 2016-12-08 DIAGNOSIS — Z13820 Encounter for screening for osteoporosis: Secondary | ICD-10-CM

## 2016-12-08 DIAGNOSIS — M8580 Other specified disorders of bone density and structure, unspecified site: Secondary | ICD-10-CM

## 2016-12-15 ENCOUNTER — Encounter: Payer: Self-pay | Admitting: Gynecology

## 2016-12-15 ENCOUNTER — Ambulatory Visit (INDEPENDENT_AMBULATORY_CARE_PROVIDER_SITE_OTHER): Payer: BLUE CROSS/BLUE SHIELD | Admitting: Gastroenterology

## 2016-12-15 ENCOUNTER — Telehealth: Payer: Self-pay | Admitting: Gynecology

## 2016-12-15 VITALS — BP 116/76 | HR 61 | Ht 60.0 in | Wt 125.0 lb

## 2016-12-15 DIAGNOSIS — K219 Gastro-esophageal reflux disease without esophagitis: Secondary | ICD-10-CM | POA: Diagnosis not present

## 2016-12-15 DIAGNOSIS — E559 Vitamin D deficiency, unspecified: Secondary | ICD-10-CM

## 2016-12-15 MED ORDER — PANTOPRAZOLE SODIUM 20 MG PO TBEC: 40.0000 mg | DELAYED_RELEASE_TABLET | Freq: Every day | ORAL | 1 refills | Status: DC

## 2016-12-15 NOTE — Progress Notes (Signed)
Gastroenterology Consultation  Referring Provider:     Abner Greenspan, MD Primary Care Physician:  Loura Pardon, MD Primary Gastroenterologist:  Dr. Jonathon Bellows  Reason for Consultation:     GERD        HPI:   Jennifer [name] is a 61 y.o. y/o female referred for consultation & management  by Dr. Loura Pardon, MD.    She has been referred by Dr Deborra Medina for reflux symptoms worse over the past few months.   Reflux: Onset : Began about 15 years, only got worse last few months  Symptoms: heartburn, no issues with swallowing usually, occasionally when she is sleeping feels like she is choking .  Recent weight gain: 12 lbs gain  Medications: no new medications  Narcotics or anticholinergics use : none  PPI /H2 blockers or Antacid  use and timing :protonix 40 mg once a day for many , 30 mins before meals .  Dinner time : 6-8pm , goes to bed at midnight , sitting up in between , has a snack before bedtime -greek yoghurt, almonds pomogrenate sits, fat free yoghurt , 5 oz, 10-30 mins before bed time for many years.   Prior EGD: 10 years back which was normal  Family history of esophageal cancer:no     Prevacid was added at night for 10 days and it helped, after stopping still feeling normal .    Past Medical History:  Diagnosis Date  . Allergic rhinitis   . Cervical dysplasia   . Endometriosis   . Esophageal reflux   . Hypothyroid   . Migraine with aura, without mention of intractable migraine without mention of status migrainosus   . Osteopenia 11/2016   T score -2.2 FRAX 9.9%/1.6%    Past Surgical History:  Procedure Laterality Date  . BREAST SURGERY     Benign breast lump excised  . CERVICAL CONE BIOPSY  1985   severe dysplasia  . COLONOSCOPY  2005   normal  . COLPOSCOPY    . ROTATOR CUFF REPAIR  08/2008    Prior to Admission medications   Medication Sig Start Date End Date Taking? Authorizing Provider  ALPRAZolam (XANAX) 0.5 MG tablet TAKE 1/2 TO 1 TABLET BY MOUTH AS NEED FOR  SLEEP OR ANXIETY 10/01/16  Yes Abner Greenspan, MD  atenolol (TENORMIN) 25 MG tablet Take 12.5 mg by mouth 3 (three) times daily.    Yes Historical Provider, MD  atenolol (TENORMIN) 25 MG tablet Take by mouth.   Yes Historical Provider, MD  baclofen (LIORESAL) 10 MG tablet Take 10 mg by mouth as needed. For migraine prevention    Yes Historical Provider, MD  Calcium Carbonate-Vitamin D 600-400 MG-UNIT tablet Take 2 tablets by mouth daily. Totaling 1200 mg   Yes Historical Provider, MD  Cholecalciferol (VITAMIN D3) 400 units tablet Take by mouth daily. 950 mg   Yes Historical Provider, MD  fish oil-omega-3 fatty acids 1000 MG capsule Take 1 g by mouth daily.     Yes Historical Provider, MD  ketorolac (TORADOL) 10 MG tablet Take 10 mg by mouth as needed. With food for Migraine   Yes Historical Provider, MD  levothyroxine (SYNTHROID, LEVOTHROID) 75 MCG tablet Take 1 tablet (75 mcg total) by mouth daily. 08/03/16  Yes Abner Greenspan, MD  Loratadine (CLARITIN PO) Take daily as needed.   Yes Historical Provider, MD  loratadine (CLARITIN) 10 MG tablet Take 10 mg by mouth daily.   Yes Historical Provider, MD  nabumetone (RELAFEN)  500 MG tablet Take 500 mg by mouth daily as needed.    Yes Historical Provider, MD  norethindrone-ethinyl estradiol (JINTELI) 1-5 MG-MCG TABS tablet Take 1 tablet by mouth daily. 11/10/16  Yes Anastasio Auerbach, MD  OMEGA-3 FATTY ACIDS-VITAMIN E PO Take by mouth.   Yes Historical Provider, MD  pantoprazole (PROTONIX) 40 MG tablet TAKE 1 TABLET (40 MG TOTAL) BY MOUTH DAILY. 08/03/16  Yes Abner Greenspan, MD  pantoprazole (PROTONIX) 40 MG tablet Take by mouth.   Yes Historical Provider, MD  promethazine (PHENERGAN) 25 MG tablet Take 25 mg by mouth as needed. When having a migraine   Yes Historical Provider, MD  promethazine (PHENERGAN) 25 MG tablet Take by mouth.   Yes Historical Provider, MD  rizatriptan (MAXALT) 10 MG tablet Take 10 mg by mouth as needed. May repeat in 2 hours if needed    Yes Historical Provider, MD    Family History  Problem Relation Age of Onset  . Breast cancer Mother 88  . Diabetes Mother   . Hypertension Father   . Heart disease Father   . Osteoporosis Maternal Grandmother   . Diabetes Maternal Grandmother   . Hypertension Sister      Social History  Substance Use Topics  . Smoking status: Never Smoker  . Smokeless tobacco: Never Used  . Alcohol use No    Allergies as of 12/15/2016 - Review Complete 12/15/2016  Allergen Reaction Noted  . Codeine phosphate Other (See Comments) 02/07/2007  . Flonase [fluticasone propionate] Other (See Comments) 11/13/2014  . Hydrocodone  11/27/2008    Review of Systems:    All systems reviewed and negative except where noted in HPI.   Physical Exam:  Ht 5' (1.524 m)   Wt 125 lb (56.7 kg)   BMI 24.41 kg/m  No LMP recorded. Patient is postmenopausal. Psych:  Alert and cooperative. Normal mood and affect. General:   Alert,  Well-developed, well-nourished, pleasant and cooperative in NAD Head:  Normocephalic and atraumatic. Eyes:  Sclera clear, no icterus.   Conjunctiva pink. Ears:  Normal auditory acuity. Nose:  No deformity, discharge, or lesions. Mouth:  No deformity or lesions,oropharynx pink & moist. Neck:  Supple; no masses or thyromegaly. Lungs:  Respirations even and unlabored.  Clear throughout to auscultation.   No wheezes, crackles, or rhonchi. No acute distress. Heart:  Regular rate and rhythm; no murmurs, clicks, rubs, or gallops. Abdomen:  Normal bowel sounds.  No bruits.  Soft, non-tender and non-distended without masses, hepatosplenomegaly or hernias noted.  No guarding or rebound tenderness.    Msk:  Symmetrical without gross deformities. Good, equal movement & strength bilaterally. Pulses:  Normal pulses noted. Extremities:  No clubbing or edema.  No cyanosis. Neurologic:  Alert and oriented x3;  grossly normal neurologically. Skin:  Intact without significant lesions or rashes. No  jaundice. Lymph Nodes:  No significant cervical adenopathy. Psych:  Alert and cooperative. Normal mood and affect.  Imaging Studies: Mm Screening Breast Tomo Bilateral  Result Date: 11/24/2016 CLINICAL DATA:  Screening. EXAM: 2D DIGITAL SCREENING BILATERAL MAMMOGRAM WITH CAD AND ADJUNCT TOMO COMPARISON:  Previous exam(s). ACR Breast Density Category d: The breast tissue is extremely dense, which lowers the sensitivity of mammography. FINDINGS: There are no findings suspicious for malignancy. Images were processed with CAD. IMPRESSION: No mammographic evidence of malignancy. A result letter of this screening mammogram will be mailed directly to the patient. RECOMMENDATION: Screening mammogram in one year. (Code:SM-B-01Y) BI-RADS CATEGORY  1: Negative. Electronically Signed  By: Nolon Nations M.D.   On: 11/24/2016 10:44  Body mass index is 24.41 kg/m.   Assessment and Plan:   Jennifer [name] is a 61 y.o. y/o female has been referred for  for worsening of GERD symptoms despite use of PPI.Since she saw her physician , her symptoms have resolved. The change in her symptoms is likely related to slight gain in weight , hiatal hernia which she says she has had, and the night time snack.   Counseled on life style changes, suggest to use PPI first thing in the morning on empty stomach and eat 30 minutes after. Advised on the use of a wedge pillow at night , avoid meals for 2 hours prior to bed time.   Discussed the risks and benefits of long term PPI use including but not limited to bone loss, chronic kidney disease, infections , low magnesium . Aim to use at the lowest dose for the shortest period of time . She will try decreasing the dose of her PPI by 50% in a few weeks , if does well I intent to transition her to a H2 blocker   Discussed the rationale for an endoscopy to screen for [facility]  and she is at low risk for [facility], she was not keen on an EGD at this time.If she changes her mind in the  future will be happy to proceed.    Follow up in 8 weeks

## 2016-12-15 NOTE — Patient Instructions (Addendum)
Gastroesophageal Reflux Disease, Adult Introduction Normally, food travels down the esophagus and stays in the stomach to be digested. If a person has gastroesophageal reflux disease (GERD), food and stomach acid move back up into the esophagus. When this happens, the esophagus becomes sore and swollen (inflamed). Over time, GERD can make small holes (ulcers) in the lining of the esophagus. Follow these instructions at home: Diet  Follow a diet as told by your doctor. You may need to avoid foods and drinks such as:  Coffee and tea (with or without caffeine).  Drinks that contain alcohol.  Energy drinks and sports drinks.  Carbonated drinks or sodas.  Chocolate and cocoa.  Peppermint and mint flavorings.  Garlic and onions.  Horseradish.  Spicy and acidic foods, such as peppers, chili powder, curry powder, vinegar, hot sauces, and BBQ sauce.  Citrus fruit juices and citrus fruits, such as oranges, lemons, and limes.  Tomato-based foods, such as red sauce, chili, salsa, and pizza with red sauce.  Fried and fatty foods, such as donuts, french fries, potato chips, and high-fat dressings.  High-fat meats, such as hot dogs, rib eye steak, sausage, ham, and bacon.  High-fat dairy items, such as whole milk, butter, and cream cheese.  Eat small meals often. Avoid eating large meals.  Avoid drinking large amounts of liquid with your meals.  Avoid eating meals during the 2-3 hours before bedtime.  Avoid lying down right after you eat.  Do not exercise right after you eat. General instructions  Pay attention to any changes in your symptoms.  Take over-the-counter and prescription medicines only as told by your doctor. Do not take aspirin, ibuprofen, or other NSAIDs unless your doctor says it is okay.  Do not use any tobacco products, including cigarettes, chewing tobacco, and e-cigarettes. If you need help quitting, ask your doctor.  Wear loose clothes. Do not wear anything  tight around your waist.  Raise (elevate) the head of your bed about 6 inches (15 cm).  Try to lower your stress. If you need help doing this, ask your doctor.  If you are overweight, lose an amount of weight that is healthy for you. Ask your doctor about a safe weight loss goal.  Keep all follow-up visits as told by your doctor. This is important. Contact a doctor if:  You have new symptoms.  You lose weight and you do not know why it is happening.  You have trouble swallowing, or it hurts to swallow.  You have wheezing or a cough that keeps happening.  Your symptoms do not get better with treatment.  You have a hoarse voice. Get help right away if:  You have pain in your arms, neck, jaw, teeth, or back.  You feel sweaty, dizzy, or light-headed.  You have chest pain or shortness of breath.  You throw up (vomit) and your throw up looks like blood or coffee grounds.  You pass out (faint).  Your poop (stool) is bloody or black.  You cannot swallow, drink, or eat. This information is not intended to replace advice given to you by your health care provider. Make sure you discuss any questions you have with your health care provider.  ** Follow Appointment March 27,2018 @ 1:00 PM** Document Released: 04/27/2008 Document Revised: 04/16/2016 Document Reviewed: 03/06/2015  2017 Elsevier

## 2016-12-15 NOTE — Telephone Encounter (Signed)
Tell patient her bone density overall looks stable. She had a little loss of the left hip but her overall fracture risk is still low. I would recommend repeating the bone density in 2 years. I would recommend checking a vitamin D level either through our office or her primary physician's office to make sure she is in the therapeutic range.

## 2016-12-15 NOTE — Telephone Encounter (Addendum)
Pt informed with the below note, orders placed. Pt will call to schedule lab appointment.

## 2016-12-18 ENCOUNTER — Other Ambulatory Visit: Payer: Self-pay

## 2016-12-18 MED ORDER — ALPRAZOLAM 0.5 MG PO TABS: ORAL_TABLET | ORAL | 0 refills | Status: DC

## 2016-12-18 NOTE — Telephone Encounter (Signed)
Px written for call in   

## 2016-12-18 NOTE — Telephone Encounter (Signed)
Pt left v/m requesting refill alprazolam to [facility]. Last refilled # 15 on 10/01/16; last annual exam on 08/03/16.

## 2016-12-18 NOTE — Telephone Encounter (Signed)
Rx called in as prescribed 

## 2016-12-28 ENCOUNTER — Telehealth: Payer: Self-pay

## 2016-12-28 NOTE — Telephone Encounter (Signed)
Advised pt of Dr. Georgeann Oppenheim recommendations. Pt will continue with plan and call back in 3 months to schedule follow up appt.

## 2016-12-28 NOTE — Telephone Encounter (Signed)
Patient is calling because Dr. Vicente Males changed her Protonix MG from 40 to 20. She has gone back to the 40 Mg because she couldn't handle the 20. Please call patient and advice on a next step of action.

## 2016-12-28 NOTE — Telephone Encounter (Signed)
Please advise regarding message below.

## 2016-12-28 NOTE — Telephone Encounter (Signed)
1. Continue with omeprazole at 40 mg a day .  2. Use wedge pillow if not using  3.  F/u in 3 months to discuss options for long term -

## 2017-01-12 DIAGNOSIS — M791 Myalgia: Secondary | ICD-10-CM | POA: Diagnosis not present

## 2017-01-12 DIAGNOSIS — M542 Cervicalgia: Secondary | ICD-10-CM | POA: Diagnosis not present

## 2017-01-12 DIAGNOSIS — G518 Other disorders of facial nerve: Secondary | ICD-10-CM | POA: Diagnosis not present

## 2017-01-12 DIAGNOSIS — G43019 Migraine without aura, intractable, without status migrainosus: Secondary | ICD-10-CM | POA: Diagnosis not present

## 2017-01-25 ENCOUNTER — Telehealth: Payer: Self-pay

## 2017-01-25 ENCOUNTER — Ambulatory Visit (INDEPENDENT_AMBULATORY_CARE_PROVIDER_SITE_OTHER): Payer: BLUE CROSS/BLUE SHIELD | Admitting: Family Medicine

## 2017-01-25 ENCOUNTER — Encounter: Payer: Self-pay | Admitting: Family Medicine

## 2017-01-25 VITALS — BP 106/70 | HR 70 | Temp 98.4°F | Ht 60.0 in | Wt 127.5 lb

## 2017-01-25 DIAGNOSIS — J069 Acute upper respiratory infection, unspecified: Secondary | ICD-10-CM | POA: Diagnosis not present

## 2017-01-25 DIAGNOSIS — B9789 Other viral agents as the cause of diseases classified elsewhere: Secondary | ICD-10-CM

## 2017-01-25 NOTE — Telephone Encounter (Signed)
PLEASE NOTE: All timestamps contained within this report are represented as [place] Standard Time. CONFIDENTIALTY NOTICE: This fax transmission is intended only for the addressee. It contains information that is legally privileged, confidential or otherwise protected from use or disclosure. If you are not the intended recipient, you are strictly prohibited from reviewing, disclosing, copying using or disseminating any of this information or taking any action in reliance on or regarding this information. If you have received this fax in error, please notify us immediately by telephone so that we can arrange for its return to [place]. Phone: [phone], Toll-Free: [phone], Fax: [phone] Page: 1 of 2 Call Id: [id number] [place] Patient Name: Jennifer [name] Gender: Female DOB: 1955-12-11 Age: 61 Y 3 M 23 D Return Phone Number: [phone] (Primary) Address: [place]/State/Zip: [place] [address] Client [place] Night - Client Client Site [facility], Roque Lias - MD Who Is Calling Patient / Member / Family / Caregiver Call Type Triage / Clinical Relationship To Patient Self Return Phone Number [phone] (Primary) Chief Complaint Sore Throat Reason for Call Symptomatic / Request for [place] is started to get a cold, sore throat, phlegm, feels tired, weak, congestion, cough. Can't take decongestants due to thyroid, is wanting predinsone. Nurse Assessment Nurse: Laqueta Due, RN, Metallurgist (Eastern Time): 01/24/2017 5:28:36 PM Confirm and document reason for call. If symptomatic, describe symptoms. ---Caller states has cold, sore throat, phlegm, congestion, cough. Can't take decongestants due to thyroid, is wanting predinsone. no fever. treating with saline nasal spray (but  doesn't have it right now.), guafinisen, robitussin. pushing fluids. Does the PT have any chronic conditions? (i.e. diabetes, asthma, etc.) ---Yes List chronic conditions. ---hypothyroidism, reflux, small hiatal hernia, seasonal allergies. Guidelines Guideline Title Affirmed Question Cough - Acute Productive Earache Disp. Time Eilene Ghazi Time) Disposition Final User 01/24/2017 5:43:28 PM See Physician within 24 Hours Yes Laqueta Due, RN, Museum/gallery conservator Referrals REFERRED TO PCP OFFICE Care Advice Given Per Guideline SEE PHYSICIAN WITHIN 24 HOURS: * IF OFFICE WILL BE OPEN: You need to be seen within the next 24 hours. Call your doctor when the office opens, and make an appointment. * Use the lowest amount that makes your pain feel better. * For pain relief, take acetaminophen, ibuprofen, or naproxen. PAIN MEDICINES: HOME REMEDY - HARD CANDY: Hard candy works just as well as a medicine-flavored OTC cough drops. COUGH DROPS FOR COUGH: COUGHING SPELLS: * Drink warm fluids. Inhale warm mist. (Reason: both relax the airway and loosen up the phlegm) * Suck on cough drops or hard candy to coat the irritated throat. CALL BACK IF: * Difficulty breathing occurs * You become worse. CARE ADVICE given per Cough - Acute Productive (Adult) guideline. PLEASE NOTE: All timestamps contained within this report are represented as [place] Standard Time. CONFIDENTIALTY NOTICE: This fax transmission is intended only for the addressee. It contains information that is legally privileged, confidential or otherwise protected from use or disclosure. If you are not the intended recipient, you are strictly prohibited from reviewing, disclosing, copying using or disseminating any of this information or taking any action in reliance on or regarding this information. If you have received this fax in error, please notify us immediately by telephone so that we can arrange for its return to [place]. Phone: [phone], Toll-Free: [phone],  Fax: [phone] Page: 2 of 2 Call Id: [id number]

## 2017-01-25 NOTE — Assessment & Plan Note (Signed)
Re assuring exam w/o wheezing or sob  Disc symptomatic care - see instructions on AVS  She is unable to take many medications -urged her to continue guifen/fluids/rest and breathe steam along with nasal saline She can use some of the tramadol she has for severe cough if needed (with caution of sedation) Try the generic relafen for pain and congestion  Stop prednisone -I do not feel she needs this currently  Update if not starting to improve in a week or if worsening

## 2017-01-25 NOTE — Patient Instructions (Addendum)
I think you have a viral cold  Do not take any more prednisone for now  Drink lots of fluids  Continue guaifenesin  Rest  Breathe steam if you can  Use nasal saline spray  Take the anti inflammatory (generic relafen) - 500 mg twice daily with a meal as needed   nasacort or flonase over the counter may be helpful but if they trigger migraine don't use them   Update if not starting to improve in a week or if worsening   Especially if fever/ sinus pain or worse wheeze/short of breath   If you have tramadol at home- this can help cough if it becomes severe -take as directed (watch out for sedation)      Upper Respiratory Infection, Adult Most upper respiratory infections (URIs) are a viral infection of the air passages leading to the lungs. A URI affects the nose, throat, and upper air passages. The most common type of URI is nasopharyngitis and is typically referred to as "the common cold." URIs run their course and usually go away on their own. Most of the time, a URI does not require medical attention, but sometimes a bacterial infection in the upper airways can follow a viral infection. This is called a secondary infection. Sinus and middle ear infections are common types of secondary upper respiratory infections. Bacterial pneumonia can also complicate a URI. A URI can worsen asthma and chronic obstructive pulmonary disease (COPD). Sometimes, these complications can require emergency medical care and may be life threatening. What are the causes? Almost all URIs are caused by viruses. A virus is a type of germ and can spread from one person to another. What increases the risk? You may be at risk for a URI if:  You smoke.  You have chronic heart or lung disease.  You have a weakened defense (immune) system.  You are very young or very old.  You have nasal allergies or asthma.  You work in crowded or poorly ventilated areas.  You work in health care facilities or schools. What  are the signs or symptoms? Symptoms typically develop 2-3 days after you come in contact with a cold virus. Most viral URIs last 7-10 days. However, viral URIs from the influenza virus (flu virus) can last 14-18 days and are typically more severe. Symptoms may include:  Runny or stuffy (congested) nose.  Sneezing.  Cough.  Sore throat.  Headache.  Fatigue.  Fever.  Loss of appetite.  Pain in your forehead, behind your eyes, and over your cheekbones (sinus pain).  Muscle aches. How is this diagnosed? Your health care provider may diagnose a URI by:  Physical exam.  Tests to check that your symptoms are not due to another condition such as:  Strep throat.  Sinusitis.  Pneumonia.  Asthma. How is this treated? A URI goes away on its own with time. It cannot be cured with medicines, but medicines may be prescribed or recommended to relieve symptoms. Medicines may help:  Reduce your fever.  Reduce your cough.  Relieve nasal congestion. Follow these instructions at home:  Take medicines only as directed by your health care provider.  Gargle warm saltwater or take cough drops to comfort your throat as directed by your health care provider.  Use a warm mist humidifier or inhale steam from a shower to increase air moisture. This may make it easier to breathe.  Drink enough fluid to keep your urine clear or pale yellow.  Eat soups and other clear  broths and maintain good nutrition.  Rest as needed.  Return to work when your temperature has returned to normal or as your health care provider advises. You may need to stay home longer to avoid infecting others. You can also use a face mask and careful hand washing to prevent spread of the virus.  Increase the usage of your inhaler if you have asthma.  Do not use any tobacco products, including cigarettes, chewing tobacco, or electronic cigarettes. If you need help quitting, ask your health care provider. How is this  prevented? The best way to protect yourself from getting a cold is to practice good hygiene.  Avoid oral or hand contact with people with cold symptoms.  Wash your hands often if contact occurs. There is no clear evidence that vitamin C, vitamin E, echinacea, or exercise reduces the chance of developing a cold. However, it is always recommended to get plenty of rest, exercise, and practice good nutrition. Contact a health care provider if:  You are getting worse rather than better.  Your symptoms are not controlled by medicine.  You have chills.  You have worsening shortness of breath.  You have brown or red mucus.  You have yellow or brown nasal discharge.  You have pain in your face, especially when you bend forward.  You have a fever.  You have swollen neck glands.  You have pain while swallowing.  You have white areas in the back of your throat. Get help right away if:  You have severe or persistent:  Headache.  Ear pain.  Sinus pain.  Chest pain.  You have chronic lung disease and any of the following:  Wheezing.  Prolonged cough.  Coughing up blood.  A change in your usual mucus.  You have a stiff neck.  You have changes in your:  Vision.  Hearing.  Thinking.  Mood. This information is not intended to replace advice given to you by your health care provider. Make sure you discuss any questions you have with your health care provider. Document Released: 05/05/2001 Document Revised: 07/12/2016 Document Reviewed: 02/14/2014 Elsevier Interactive Patient Education  2017 [employer].

## 2017-01-25 NOTE — Progress Notes (Signed)
Pre visit review using our clinic review tool, if applicable. No additional management support is needed unless otherwise documented below in the visit note. 

## 2017-01-25 NOTE — Progress Notes (Signed)
Subjective:    Patient ID: Jennifer [name], female    DOB: February 27, 1956, 61 y.o.   MRN: [id number]  HPI Here for uri symptoms with cough and congestion   She took one of her 20 mg prednisone pills that she has for migraines   Started her symptoms last Thursday (was traveling in [facility]) She had itchy eyes and puffiness under her eyes  Friday - ST that got better  Saturday- worse again and ST with hoarse voice  Sunday nasal congestion and ear congestion   Started guifen yesterday  Did not have saline with her   No fever  Cough -productive - white to clear with some specks of brown  Breathing is a little tight as well   Nasal d/c is clear   She has px for nabumatone (nsaid) and took 1/2 yesterday and this am   Patient Active Problem List   Diagnosis Date Noted  . Viral URI with cough 01/25/2017  . Screening for diabetes mellitus 07/28/2016  . Allergic rhinitis 11/13/2014  . Adenopathy, cervical 10/30/2014  . Screening for lipoid disorders 09/26/2014  . Preventative health care 09/26/2014  . Routine general medical examination at a health care facility 10/28/2012  . Recurrent cold sores 04/28/2011  . GERD 09/16/2010  . Hypothyroidism 05/07/2008  . Osteopenia 05/07/2008  . Migraine with aura 11/11/2007   Past Medical History:  Diagnosis Date  . Allergic rhinitis   . Cervical dysplasia   . Endometriosis   . Esophageal reflux   . Hypothyroid   . Migraine with aura, without mention of intractable migraine without mention of status migrainosus   . Osteopenia 11/2016   T score -2.2 FRAX 9.9%/1.6%   Past Surgical History:  Procedure Laterality Date  . BREAST SURGERY     Benign breast lump excised  . CERVICAL CONE BIOPSY  1985   severe dysplasia  . COLONOSCOPY  2005   normal  . COLPOSCOPY    . ROTATOR CUFF REPAIR  08/2008   Social History  Substance Use Topics  . Smoking status: Never Smoker  . Smokeless tobacco: Never Used  . Alcohol use No   Family History  Problem  Relation Age of Onset  . Breast cancer Mother 54  . Diabetes Mother   . Hypertension Father   . Heart disease Father   . Osteoporosis Maternal Grandmother   . Diabetes Maternal Grandmother   . Hypertension Sister    Allergies  Allergen Reactions  . Codeine Phosphate Other (See Comments)    Dizziness, heart palpitations, nervous  . Flonase [Fluticasone Propionate] Other (See Comments)    Worsens her migraine   . Hydrocodone     REACTION: made nervous--10/09 surgery rotator cuff   Current Outpatient Prescriptions on File Prior to Visit  Medication Sig Dispense Refill  . ALPRAZolam (XANAX) 0.5 MG tablet TAKE 1/2 TO 1 TABLET BY MOUTH AS NEED FOR SLEEP OR ANXIETY 15 tablet 0  . atenolol (TENORMIN) 25 MG tablet Take 12.5 mg by mouth 3 (three) times daily.     Marland Kitchen atenolol (TENORMIN) 25 MG tablet Take by mouth.    . baclofen (LIORESAL) 10 MG tablet Take 10 mg by mouth as needed. For migraine prevention     . Calcium Carbonate-Vitamin D 600-400 MG-UNIT tablet Take 2 tablets by mouth daily. Totaling 1200 mg    . Cholecalciferol (VITAMIN D3) 400 units tablet Take by mouth daily. 950 mg    . fish oil-omega-3 fatty acids 1000 MG capsule Take 1 g  by mouth daily.      Marland Kitchen ketorolac (TORADOL) 10 MG tablet Take 10 mg by mouth as needed. With food for Migraine    . levothyroxine (SYNTHROID, LEVOTHROID) 75 MCG tablet Take 1 tablet (75 mcg total) by mouth daily. 30 tablet 11  . Loratadine (CLARITIN PO) Take daily as needed.    . loratadine (CLARITIN) 10 MG tablet Take 10 mg by mouth daily.    . nabumetone (RELAFEN) 500 MG tablet Take 500 mg by mouth daily as needed.     . norethindrone-ethinyl estradiol (JINTELI) 1-5 MG-MCG TABS tablet Take 1 tablet by mouth daily. 84 tablet 4  . OMEGA-3 FATTY ACIDS-VITAMIN E PO Take by mouth.    . promethazine (PHENERGAN) 25 MG tablet Take 25 mg by mouth as needed. When having a migraine    . promethazine (PHENERGAN) 25 MG tablet Take by mouth.    . rizatriptan  (MAXALT) 10 MG tablet Take 10 mg by mouth as needed. May repeat in 2 hours if needed    . pantoprazole (PROTONIX) 20 MG tablet Take 2 tablets (40 mg total) by mouth daily. 60 tablet 1   No current facility-administered medications on file prior to visit.     Review of Systems  Constitutional: Positive for appetite change and fatigue. Negative for fever.  HENT: Positive for congestion, postnasal drip, rhinorrhea, sinus pressure, sneezing and sore throat. Negative for ear pain.   Eyes: Positive for itching. Negative for pain, discharge and redness.  Respiratory: Positive for cough. Negative for shortness of breath, wheezing and stridor.   Cardiovascular: Negative for chest pain.  Gastrointestinal: Negative for diarrhea, nausea and vomiting.  Genitourinary: Negative for frequency, hematuria and urgency.  Musculoskeletal: Negative for arthralgias and myalgias.  Skin: Negative for rash.  Neurological: Positive for headaches. Negative for dizziness, weakness and light-headedness.  Psychiatric/Behavioral: Negative for confusion and dysphoric mood.       Objective:   Physical Exam  Constitutional: She appears well-developed and well-nourished. No distress.  Well appearing with nasal congestion  HENT:  Head: Normocephalic and atraumatic.  Right Ear: External ear normal.  Left Ear: External ear normal.  Mouth/Throat: Oropharynx is clear and moist.  Nares are injected and congested  No sinus tenderness Clear rhinorrhea and post nasal drip  No erythema or swelling of throat   Eyes: Conjunctivae and EOM are normal. Pupils are equal, round, and reactive to light. Right eye exhibits no discharge. Left eye exhibits no discharge. No scleral icterus.  Neck: Normal range of motion. Neck supple.  Cardiovascular: Normal rate and normal heart sounds.   Pulmonary/Chest: Effort normal and breath sounds normal. No respiratory distress. She has no wheezes. She has no rales. She exhibits no tenderness.    Good air exch No wheeze/even on forced expiration  Lymphadenopathy:    She has no cervical adenopathy.  Neurological: She is alert.  Skin: Skin is warm and dry. No rash noted.  Psychiatric: She has a normal mood and affect.          Assessment & Plan:   Problem List Items Addressed This Visit      Respiratory   Viral URI with cough    Re assuring exam w/o wheezing or sob  Disc symptomatic care - see instructions on AVS  She is unable to take many medications -urged her to continue guifen/fluids/rest and breathe steam along with nasal saline She can use some of the tramadol she has for severe cough if needed (with caution of sedation)  Try the generic relafen for pain and congestion  Stop prednisone -I do not feel she needs this currently  Update if not starting to improve in a week or if worsening

## 2017-01-25 NOTE — Telephone Encounter (Signed)
Pt has appt on 01/25/17 at 4 PM with Dr Glori Bickers.

## 2017-01-25 NOTE — Telephone Encounter (Signed)
I will see her then  

## 2017-01-26 ENCOUNTER — Telehealth: Payer: Self-pay

## 2017-01-26 MED ORDER — TRAMADOL HCL 50 MG PO TABS: 50.0000 mg | ORAL_TABLET | Freq: Three times a day (TID) | ORAL | 0 refills | Status: DC | PRN

## 2017-01-26 NOTE — Telephone Encounter (Signed)
She thought she had some- guess not  Px written for call in

## 2017-01-26 NOTE — Telephone Encounter (Signed)
Rx called in as prescribed 

## 2017-01-26 NOTE — Telephone Encounter (Signed)
Pt left v/m requesting tramadol to be called in to [facility]; pt was seen 01/25/17 and per office note pt can use tramadol for cough prn (sedation precaution) do not see tramadol on current or hx med list.

## 2017-01-27 ENCOUNTER — Telehealth: Payer: Self-pay

## 2017-01-27 MED ORDER — PREDNISONE 10 MG PO TABS: ORAL_TABLET | ORAL | 0 refills | Status: DC

## 2017-01-27 NOTE — Telephone Encounter (Signed)
Pt notified Rx sent 

## 2017-01-27 NOTE — Telephone Encounter (Signed)
Prednisone sent

## 2017-01-27 NOTE — Telephone Encounter (Signed)
Patient was here asking to restart on the prednisone she said she would like a low dose of prednisone taper, she said she couldn't sleep last night. Her throat is raw, nasal congested has clear mucus with some green and brown in it. She hasn't had to use the tramadol for her cough.  She's using the guaifenesin every 4hrs prn.  [facility] [place].

## 2017-03-15 ENCOUNTER — Encounter: Payer: Self-pay | Admitting: Gastroenterology

## 2017-03-15 ENCOUNTER — Ambulatory Visit (INDEPENDENT_AMBULATORY_CARE_PROVIDER_SITE_OTHER): Payer: BLUE CROSS/BLUE SHIELD | Admitting: Gastroenterology

## 2017-03-15 ENCOUNTER — Other Ambulatory Visit: Payer: Self-pay

## 2017-03-15 VITALS — BP 114/65 | HR 62 | Temp 98.0°F | Wt 128.2 lb

## 2017-03-15 DIAGNOSIS — K219 Gastro-esophageal reflux disease without esophagitis: Secondary | ICD-10-CM | POA: Diagnosis not present

## 2017-03-15 MED ORDER — RANITIDINE HCL 150 MG PO TABS: 150.0000 mg | ORAL_TABLET | Freq: Two times a day (BID) | ORAL | 2 refills | Status: DC

## 2017-03-15 NOTE — Progress Notes (Signed)
Primary Care Physician: Loura Pardon, MD  Primary Gastroenterologist:  Dr. Jonathon Bellows    She is here today to follow up for GERD.   Summary of history : She was initially seen on 12/15/16 with a 15 year history of GERD , worse last few months , been on Protonix for many years at 40 mg, prevacid added recently at night but had no improvement of symptoms.  Recent gain in weight likely felt to be the reason for worsening of her symptoms and consuming a night time snack prior to bed time.   Interval history   12/15/2016-  03/15/2017    Enforced lifestyle changes which we discussed in depth last visit.She says that since cutting her night time snack and has had a significant improvement - no choking episodes since.   Presently she has gone back to 40 mg protonix. Not on any prevacid. She had recurrence of symptoms at 20 mg when I decreased the dose.    Chief Complaint  Patient presents with  . Follow-up  . Gastroesophageal Reflux    LIFESTYLE CHANGES HELPED    HPI: Jennifer [name] is a 61 y.o. female  Current Outpatient Prescriptions  Medication Sig Dispense Refill  . atenolol (TENORMIN) 25 MG tablet Take by mouth.    . baclofen (LIORESAL) 10 MG tablet Take 10 mg by mouth as needed. For migraine prevention     . Calcium Carbonate-Vitamin D 600-400 MG-UNIT tablet Take 2 tablets by mouth daily. Totaling 1200 mg    . Cholecalciferol (VITAMIN D3) 400 units tablet Take by mouth daily. 950 mg    . ketorolac (TORADOL) 10 MG tablet Take 10 mg by mouth as needed. With food for Migraine    . levothyroxine (SYNTHROID, LEVOTHROID) 75 MCG tablet Take 1 tablet (75 mcg total) by mouth daily. 30 tablet 11  . loratadine (CLARITIN) 10 MG tablet Take 10 mg by mouth daily.    . nabumetone (RELAFEN) 500 MG tablet Take 500 mg by mouth daily as needed.     . norethindrone-ethinyl estradiol (JINTELI) 1-5 MG-MCG TABS tablet Take 1 tablet by mouth daily. 84 tablet 4  . pantoprazole (PROTONIX) 40 MG tablet   5  .  predniSONE (DELTASONE) 20 MG tablet TAPER AS DIRECTED OVER 4 DAYS, TAKING 4 TABS-3 TABS-2 TABS-1 TAB. TO BREAK  0  . promethazine (PHENERGAN) 25 MG tablet Take 25 mg by mouth as needed. When having a migraine    . rizatriptan (MAXALT) 10 MG tablet Take 10 mg by mouth as needed. May repeat in 2 hours if needed    . traMADol (ULTRAM) 50 MG tablet Take 1 tablet (50 mg total) by mouth every 8 (eight) hours as needed (cough). Caution of sedation 30 tablet 0  . ALPRAZolam (XANAX) 0.5 MG tablet TAKE 1/2 TO 1 TABLET BY MOUTH AS NEED FOR SLEEP OR ANXIETY 15 tablet 0  . Omega-3 Fatty Acids (FISH OIL) 1000 MG CAPS Take by mouth.    . pantoprazole (PROTONIX) 20 MG tablet   0  . predniSONE (DELTASONE) 10 MG tablet Take 3 pills once daily by mouth for 3 days, then 2 pills once daily for 3 days, then 1 pill once daily for 3 days and then stop (Patient not taking: Reported on 03/15/2017) 18 tablet 0   No current facility-administered medications for this visit.     Allergies as of 03/15/2017 - Review Complete 03/15/2017  Allergen Reaction Noted  . Codeine phosphate Other (See Comments) 02/07/2007  . Flonase [  fluticasone propionate] Other (See Comments) 11/13/2014  . Hydrocodone  11/27/2008    ROS:  General: Negative for anorexia, weight loss, fever, chills, fatigue, weakness. ENT: Negative for hoarseness, difficulty swallowing , nasal congestion. CV: Negative for chest pain, angina, palpitations, dyspnea on exertion, peripheral edema.  Respiratory: Negative for dyspnea at rest, dyspnea on exertion, cough, sputum, wheezing.  GI: See history of present illness. GU:  Negative for dysuria, hematuria, urinary incontinence, urinary frequency, nocturnal urination.  Endo: Negative for unusual weight change.    Physical Examination:   BP 114/65   Pulse 62   Temp 98 F (36.7 C) (Oral)   Wt 128 lb 3.2 oz (58.2 kg)   BMI 25.04 kg/m   General: Well-nourished, well-developed in no acute distress.  Eyes: No  icterus. Conjunctivae pink. Mouth: Oropharyngeal mucosa moist and pink , no lesions erythema or exudate. Lungs: Clear to auscultation bilaterally. Non-labored. Heart: Regular rate and rhythm, no murmurs rubs or gallops.  Abdomen: Bowel sounds are normal, nontender, nondistended, no hepatosplenomegaly or masses, no abdominal bruits or hernia , no rebound or guarding.   Extremities: No lower extremity edema. No clubbing or deformities. Neuro: Alert and oriented x 3.  Grossly intact. Skin: Warm and dry, no jaundice.   Psych: Alert and cooperative, normal mood and affect.   Imaging Studies: No results found.  Assessment and Plan:   Jennifer [name] is a 61 y.o. y/o female here to follow up  for worsening of GERD symptoms despite use of PPI.Marland KitchenAfter changing her life style changes has noticed a significant improvement. She did not tolerate a lower dose of PPI/. She will try to switch to BID dose H 2 blocker and stop PPI.   Dr Jonathon Bellows  MD Follow up in 4 months.

## 2017-03-29 ENCOUNTER — Other Ambulatory Visit: Payer: Self-pay | Admitting: *Deleted

## 2017-03-29 MED ORDER — ALPRAZOLAM 0.5 MG PO TABS: ORAL_TABLET | ORAL | 0 refills | Status: DC

## 2017-03-29 NOTE — Telephone Encounter (Signed)
Rx called in as prescribed 

## 2017-03-29 NOTE — Telephone Encounter (Signed)
Px written for call in   

## 2017-03-29 NOTE — Telephone Encounter (Signed)
Last Rx 12/18/2016. Last OV  11/2016-acute. pls advise

## 2017-04-13 DIAGNOSIS — G43019 Migraine without aura, intractable, without status migrainosus: Secondary | ICD-10-CM | POA: Diagnosis not present

## 2017-05-24 ENCOUNTER — Ambulatory Visit (INDEPENDENT_AMBULATORY_CARE_PROVIDER_SITE_OTHER): Payer: BLUE CROSS/BLUE SHIELD | Admitting: Family Medicine

## 2017-05-24 ENCOUNTER — Encounter: Payer: Self-pay | Admitting: Family Medicine

## 2017-05-24 VITALS — BP 110/70 | HR 59 | Temp 98.4°F | Wt 125.0 lb

## 2017-05-24 DIAGNOSIS — H9203 Otalgia, bilateral: Secondary | ICD-10-CM | POA: Diagnosis not present

## 2017-05-24 DIAGNOSIS — B001 Herpesviral vesicular dermatitis: Secondary | ICD-10-CM | POA: Diagnosis not present

## 2017-05-24 MED ORDER — PREDNISONE 20 MG PO TABS: ORAL_TABLET | ORAL | 0 refills | Status: DC

## 2017-05-24 MED ORDER — VALACYCLOVIR HCL 1 G PO TABS: 2000.0000 mg | ORAL_TABLET | Freq: Two times a day (BID) | ORAL | 3 refills | Status: DC

## 2017-05-24 MED ORDER — AMOXICILLIN-POT CLAVULANATE 875-125 MG PO TABS: 1.0000 | ORAL_TABLET | Freq: Two times a day (BID) | ORAL | 0 refills | Status: DC

## 2017-05-24 NOTE — Assessment & Plan Note (Signed)
Rx valtrex to pharmacy.

## 2017-05-24 NOTE — Assessment & Plan Note (Addendum)
No signs of ear infection. Anticipate referred pain from acute bacterial sinusitis given duration and progession of symptoms. Treat with 7d augmentin course, short prednisone burst. Update if not improving with treatment.

## 2017-05-24 NOTE — Patient Instructions (Addendum)
Valtrex sent to pharmacy for possible cold sore.  I am suspicious for sinus infection triggering migraine. Treat with 7 d augmentin course. May take short prednisone burst as well. Push fluids and rest.  May take simple mucinex with plenty of water to help mobilize mucous.  Good to see you today, call us with questions.

## 2017-05-24 NOTE — Progress Notes (Signed)
BP 110/70 (BP Location: Left Arm, Patient Position: Sitting, Cuff Size: Normal)   Pulse (!) 59   Temp 98.4 F (36.9 C) (Oral)   Wt 125 lb (56.7 kg)   SpO2 98%   BMI 24.41 kg/m    CC: ear pain Subjective:    Patient ID: Jennifer [name], female    DOB: [birth year], 61 y.o.   MRN: [id number]  HPI: Jennifer [name] is a 61 y.o. female presenting on 05/24/2017 for Ear Pain (Bilateral ear pain for 2 weeks.) and Mouth Lesions (Thinks she has a cold sore coming up)   2 wk h/o bilateral earache associated with R maxillary sinus headache. No significant congestion. She did recently take plane ride. Initially improved but over weekend sxs seem to be worsening again.   Also felt tingling in lips lower midline and left upper lip, possible cold sore starting.  Denies fevers/chills, tooth pain, ST, PNdainage. So far has not tried anything for this.  Not around smokers.   H/o migraines - ongoing for last 4 days. Has taken prednisone for this - sees neurology.  This doesn't feel like prior sinus infections.  Known allergic rhinitis treated with claritin.   Relevant past medical, surgical, family and social history reviewed and updated as indicated. Interim medical history since our last visit reviewed. Allergies and medications reviewed and updated. Outpatient Medications Prior to Visit  Medication Sig Dispense Refill  . ALPRAZolam (XANAX) 0.5 MG tablet TAKE 1/2 TO 1 TABLET BY MOUTH AS NEED FOR SLEEP OR ANXIETY 15 tablet 0  . atenolol (TENORMIN) 25 MG tablet Take by mouth.    . baclofen (LIORESAL) 10 MG tablet Take 10 mg by mouth as needed. For migraine prevention     . Calcium Carbonate-Vitamin D 600-400 MG-UNIT tablet Take 2 tablets by mouth daily. Totaling 1200 mg    . Cholecalciferol (VITAMIN D3) 400 units tablet Take by mouth daily. 950 mg    . ketorolac (TORADOL) 10 MG tablet Take 10 mg by mouth as needed. With food for Migraine    . levothyroxine (SYNTHROID, LEVOTHROID) 75 MCG tablet Take 1 tablet  (75 mcg total) by mouth daily. 30 tablet 11  . loratadine (CLARITIN) 10 MG tablet Take 10 mg by mouth daily.    . nabumetone (RELAFEN) 500 MG tablet Take 500 mg by mouth daily as needed.     . norethindrone-ethinyl estradiol (JINTELI) 1-5 MG-MCG TABS tablet Take 1 tablet by mouth daily. 84 tablet 4  . Omega-3 Fatty Acids (FISH OIL) 1000 MG CAPS Take by mouth.    . pantoprazole (PROTONIX) 40 MG tablet   5  . predniSONE (DELTASONE) 10 MG tablet Take 3 pills once daily by mouth for 3 days, then 2 pills once daily for 3 days, then 1 pill once daily for 3 days and then stop 18 tablet 0  . predniSONE (DELTASONE) 20 MG tablet TAPER AS DIRECTED OVER 4 DAYS, TAKING 4 TABS-3 TABS-2 TABS-1 TAB. TO BREAK  0  . promethazine (PHENERGAN) 25 MG tablet Take 25 mg by mouth as needed. When having a migraine    . rizatriptan (MAXALT) 10 MG tablet Take 10 mg by mouth as needed. May repeat in 2 hours if needed    . traMADol (ULTRAM) 50 MG tablet Take 1 tablet (50 mg total) by mouth every 8 (eight) hours as needed (cough). Caution of sedation 30 tablet 0  . ranitidine (ZANTAC) 150 MG tablet Take 1 tablet (150 mg total) by mouth 2 (two) times daily.  60 tablet 2  . pantoprazole (PROTONIX) 20 MG tablet   0   No facility-administered medications prior to visit.      Per HPI unless specifically indicated in ROS section below Review of Systems     Objective:    BP 110/70 (BP Location: Left Arm, Patient Position: Sitting, Cuff Size: Normal)   Pulse (!) 59   Temp 98.4 F (36.9 C) (Oral)   Wt 125 lb (56.7 kg)   SpO2 98%   BMI 24.41 kg/m   Wt Readings from Last 3 Encounters:  05/24/17 125 lb (56.7 kg)  03/15/17 128 lb 3.2 oz (58.2 kg)  01/25/17 127 lb 8 oz (57.8 kg)    Physical Exam  Constitutional: She appears well-developed and well-nourished. No distress.  HENT:  Head: Normocephalic and atraumatic.  Right Ear: Hearing, tympanic membrane, external ear and ear canal normal.  Left Ear: Hearing, tympanic  membrane, external ear and ear canal normal.  Nose: Mucosal edema present. No rhinorrhea. Right sinus exhibits maxillary sinus tenderness (mild). Right sinus exhibits no frontal sinus tenderness. Left sinus exhibits maxillary sinus tenderness (mild). Left sinus exhibits no frontal sinus tenderness.  Mouth/Throat: Uvula is midline, oropharynx is clear and moist and mucous membranes are normal. No oropharyngeal exudate, posterior oropharyngeal edema, posterior oropharyngeal erythema or tonsillar abscesses.  Mild tender lip swelling left upper lip without erythema or fluctuance  Eyes: Conjunctivae and EOM are normal. Pupils are equal, round, and reactive to light. No scleral icterus.  Neck: Normal range of motion. Neck supple.  Cardiovascular: Normal rate, regular rhythm, normal heart sounds and intact distal pulses.   No murmur heard. Pulmonary/Chest: Effort normal and breath sounds normal. No respiratory distress. She has no wheezes. She has no rales.  Lymphadenopathy:    She has no cervical adenopathy.  Skin: Skin is warm and dry. No rash noted.  Nursing note and vitals reviewed.  Results for orders placed or performed in visit on 11/25/16  H. pylori antibody, IgG  Result Value Ref Range   H Pylori IgG Negative Negative      Assessment & Plan:   Problem List Items Addressed This Visit    Ear pain, bilateral - Primary    No signs of ear infection. Anticipate referred pain from acute bacterial sinusitis given duration and progession of symptoms. Treat with 7d augmentin course, short prednisone burst. Update if not improving with treatment.       Herpes labialis    Rx valtrex to pharmacy.       Relevant Medications   valACYclovir (VALTREX) 1000 MG tablet       Follow up plan: Return if symptoms worsen or fail to improve.  Ria Bush, MD

## 2017-05-27 DIAGNOSIS — G43019 Migraine without aura, intractable, without status migrainosus: Secondary | ICD-10-CM | POA: Diagnosis not present

## 2017-05-27 DIAGNOSIS — M791 Myalgia: Secondary | ICD-10-CM | POA: Diagnosis not present

## 2017-05-27 DIAGNOSIS — M542 Cervicalgia: Secondary | ICD-10-CM | POA: Diagnosis not present

## 2017-05-27 DIAGNOSIS — G518 Other disorders of facial nerve: Secondary | ICD-10-CM | POA: Diagnosis not present

## 2017-06-02 ENCOUNTER — Inpatient Hospital Stay (HOSPITAL_COMMUNITY)
Admit: 2017-06-02 | Discharge: 2017-06-02 | Disposition: A | Discharge status: Home / Self Care | Payer: BLUE CROSS/BLUE SHIELD | Attending: Student | Admitting: Student

## 2017-06-02 ENCOUNTER — Emergency Department: Payer: BLUE CROSS/BLUE SHIELD

## 2017-06-02 ENCOUNTER — Telehealth: Payer: Self-pay | Admitting: Family Medicine

## 2017-06-02 ENCOUNTER — Encounter: Payer: Self-pay | Admitting: Internal Medicine

## 2017-06-02 ENCOUNTER — Inpatient Hospital Stay
Admission: EM | Admit: 2017-06-02 | Discharge: 2017-06-03 | DRG: 313 | Disposition: A | Discharge status: Home / Self Care | Payer: BLUE CROSS/BLUE SHIELD | Attending: Internal Medicine | Admitting: Internal Medicine

## 2017-06-02 ENCOUNTER — Other Ambulatory Visit: Payer: Self-pay

## 2017-06-02 DIAGNOSIS — Z888 Allergy status to other drugs, medicaments and biological substances status: Secondary | ICD-10-CM

## 2017-06-02 DIAGNOSIS — Z8262 Family history of osteoporosis: Secondary | ICD-10-CM

## 2017-06-02 DIAGNOSIS — Z833 Family history of diabetes mellitus: Secondary | ICD-10-CM

## 2017-06-02 DIAGNOSIS — G43109 Migraine with aura, not intractable, without status migrainosus: Secondary | ICD-10-CM | POA: Diagnosis present

## 2017-06-02 DIAGNOSIS — Z79899 Other long term (current) drug therapy: Secondary | ICD-10-CM

## 2017-06-02 DIAGNOSIS — I10 Essential (primary) hypertension: Secondary | ICD-10-CM | POA: Diagnosis not present

## 2017-06-02 DIAGNOSIS — Z79890 Hormone replacement therapy (postmenopausal): Secondary | ICD-10-CM | POA: Diagnosis not present

## 2017-06-02 DIAGNOSIS — Z79891 Long term (current) use of opiate analgesic: Secondary | ICD-10-CM | POA: Diagnosis not present

## 2017-06-02 DIAGNOSIS — Z8249 Family history of ischemic heart disease and other diseases of the circulatory system: Secondary | ICD-10-CM | POA: Diagnosis not present

## 2017-06-02 DIAGNOSIS — I214 Non-ST elevation (NSTEMI) myocardial infarction: Secondary | ICD-10-CM

## 2017-06-02 DIAGNOSIS — Z803 Family history of malignant neoplasm of breast: Secondary | ICD-10-CM

## 2017-06-02 DIAGNOSIS — R079 Chest pain, unspecified: Secondary | ICD-10-CM

## 2017-06-02 DIAGNOSIS — Z885 Allergy status to narcotic agent status: Secondary | ICD-10-CM | POA: Diagnosis not present

## 2017-06-02 DIAGNOSIS — Z791 Long term (current) use of non-steroidal anti-inflammatories (NSAID): Secondary | ICD-10-CM

## 2017-06-02 DIAGNOSIS — R7989 Other specified abnormal findings of blood chemistry: Secondary | ICD-10-CM | POA: Diagnosis present

## 2017-06-02 DIAGNOSIS — N951 Menopausal and female climacteric states: Secondary | ICD-10-CM | POA: Diagnosis present

## 2017-06-02 DIAGNOSIS — K219 Gastro-esophageal reflux disease without esophagitis: Secondary | ICD-10-CM | POA: Diagnosis not present

## 2017-06-02 DIAGNOSIS — N809 Endometriosis, unspecified: Secondary | ICD-10-CM | POA: Diagnosis present

## 2017-06-02 DIAGNOSIS — E039 Hypothyroidism, unspecified: Secondary | ICD-10-CM | POA: Diagnosis not present

## 2017-06-02 DIAGNOSIS — R778 Other specified abnormalities of plasma proteins: Secondary | ICD-10-CM | POA: Diagnosis present

## 2017-06-02 DIAGNOSIS — R071 Chest pain on breathing: Secondary | ICD-10-CM | POA: Diagnosis not present

## 2017-06-02 DIAGNOSIS — F419 Anxiety disorder, unspecified: Secondary | ICD-10-CM | POA: Diagnosis present

## 2017-06-02 DIAGNOSIS — R0789 Other chest pain: Secondary | ICD-10-CM

## 2017-06-02 DIAGNOSIS — M8580 Other specified disorders of bone density and structure, unspecified site: Secondary | ICD-10-CM | POA: Diagnosis present

## 2017-06-02 DIAGNOSIS — Z87410 Personal history of cervical dysplasia: Secondary | ICD-10-CM | POA: Diagnosis not present

## 2017-06-02 DIAGNOSIS — R748 Abnormal levels of other serum enzymes: Secondary | ICD-10-CM | POA: Diagnosis not present

## 2017-06-02 LAB — BASIC METABOLIC PANEL
ANION GAP: 8 (ref 5–15)
BUN: 16 mg/dL (ref 6–20)
CO2: 25 mmol/L (ref 22–32)
Calcium: 9.4 mg/dL (ref 8.9–10.3)
Chloride: 104 mmol/L (ref 101–111)
Creatinine, Ser: 0.75 mg/dL (ref 0.44–1.00)
GFR calc Af Amer: >60 mL/min (ref >60)
GLUCOSE: 106 mg/dL — AB (ref 65–99)
POTASSIUM: 3.6 mmol/L (ref 3.5–5.1)
Sodium: 137 mmol/L (ref 135–145)

## 2017-06-02 LAB — PROTIME-INR
INR: 0.97
PROTHROMBIN TIME: 12.9 s (ref 11.4–15.2)

## 2017-06-02 LAB — CBC
HCT: 37.6 % (ref 35.0–47.0)
Hemoglobin: 13.2 g/dL (ref 12.0–16.0)
MCH: 30 pg (ref 26.0–34.0)
MCHC: 35 g/dL (ref 32.0–36.0)
MCV: 85.8 fL (ref 80.0–100.0)
Platelets: 309 10*3/uL (ref 150–440)
RBC: 4.38 MIL/uL (ref 3.80–5.20)
RDW: 12.9 % (ref 11.5–14.5)
WBC: 6.8 10*3/uL (ref 3.6–11.0)

## 2017-06-02 LAB — LIPID PANEL
CHOL/HDL RATIO: 3.2 ratio
CHOLESTEROL: 166 mg/dL (ref 0–200)
HDL: 52 mg/dL (ref >40)
LDL Cholesterol: 98 mg/dL (ref 0–99)
Triglycerides: 79 mg/dL (ref <150)
VLDL: 16 mg/dL (ref 0–40)

## 2017-06-02 LAB — TROPONIN I
TROPONIN I: 0.16 ng/mL — AB (ref <0.03)
Troponin I: 0.16 ng/mL (ref <0.03)
Troponin I: 0.15 ng/mL (ref <0.03)

## 2017-06-02 LAB — HEPARIN LEVEL (UNFRACTIONATED): Heparin Unfractionated: 0.68 IU/mL (ref 0.30–0.70)

## 2017-06-02 LAB — ECHOCARDIOGRAM COMPLETE
Height: 61 in
WEIGHTICAEL: 2000 [oz_av]

## 2017-06-02 LAB — APTT: aPTT: 28 s (ref 24–36)

## 2017-06-02 LAB — TSH: TSH: 1.027 u[IU]/mL (ref 0.350–4.500)

## 2017-06-02 MED ORDER — ONDANSETRON HCL 4 MG PO TABS: 4.0000 mg | ORAL_TABLET | Freq: Four times a day (QID) | ORAL | Status: DC | PRN

## 2017-06-02 MED ORDER — ASPIRIN 81 MG PO CHEW
324.0000 mg | CHEWABLE_TABLET | Freq: Once | ORAL | Status: AC
  Administered 2017-06-02: 324 mg via ORAL
  Filled 2017-06-02: qty 4

## 2017-06-02 MED ORDER — OMEGA-3-ACID ETHYL ESTERS 1 G PO CAPS
1.0000 g | ORAL_CAPSULE | Freq: Every day | ORAL | Status: DC
  Administered 2017-06-02 – 2017-06-03 (×2): 1 g via ORAL
  Filled 2017-06-02 (×2): qty 1

## 2017-06-02 MED ORDER — NITROGLYCERIN 0.4 MG SL SUBL
0.4000 mg | SUBLINGUAL_TABLET | SUBLINGUAL | Status: DC | PRN
  Filled 2017-06-02: qty 1

## 2017-06-02 MED ORDER — NABUMETONE 500 MG PO TABS
500.0000 mg | ORAL_TABLET | Freq: Every day | ORAL | Status: DC | PRN
  Filled 2017-06-02: qty 1

## 2017-06-02 MED ORDER — CALCIUM CARBONATE-VITAMIN D 500-200 MG-UNIT PO TABS
2.0000 | ORAL_TABLET | Freq: Every day | ORAL | Status: DC
  Administered 2017-06-03: 2 via ORAL
  Filled 2017-06-02 (×2): qty 2

## 2017-06-02 MED ORDER — PANTOPRAZOLE SODIUM 40 MG PO TBEC
40.0000 mg | DELAYED_RELEASE_TABLET | Freq: Every day | ORAL | Status: DC
  Administered 2017-06-03: 40 mg via ORAL
  Filled 2017-06-02: qty 1

## 2017-06-02 MED ORDER — GI COCKTAIL ~~LOC~~
30.0000 mL | Freq: Once | ORAL | Status: AC
  Administered 2017-06-02: 30 mL via ORAL

## 2017-06-02 MED ORDER — ALPRAZOLAM 0.5 MG PO TABS: 0.5000 mg | ORAL_TABLET | Freq: Two times a day (BID) | ORAL | Status: DC | PRN

## 2017-06-02 MED ORDER — ATENOLOL 25 MG PO TABS
25.0000 mg | ORAL_TABLET | Freq: Every day | ORAL | Status: DC
  Administered 2017-06-03: 25 mg via ORAL
  Filled 2017-06-02 (×2): qty 1

## 2017-06-02 MED ORDER — HEPARIN SODIUM (PORCINE) 5000 UNIT/ML IJ SOLN
INTRAMUSCULAR | Status: AC
  Filled 2017-06-02: qty 1

## 2017-06-02 MED ORDER — ACETAMINOPHEN 325 MG PO TABS: 650.0000 mg | ORAL_TABLET | Freq: Four times a day (QID) | ORAL | Status: DC | PRN

## 2017-06-02 MED ORDER — GI COCKTAIL ~~LOC~~
ORAL | Status: AC
  Administered 2017-06-02: 30 mL via ORAL
  Filled 2017-06-02: qty 30

## 2017-06-02 MED ORDER — NORETHINDRONE-ETH ESTRADIOL 1-5 MG-MCG PO TABS: 1.0000 | ORAL_TABLET | Freq: Every day | ORAL | Status: DC

## 2017-06-02 MED ORDER — ALUM & MAG HYDROXIDE-SIMETH 200-200-20 MG/5ML PO SUSP: 30.0000 mL | Freq: Four times a day (QID) | ORAL | Status: DC | PRN

## 2017-06-02 MED ORDER — HEPARIN BOLUS VIA INFUSION
3400.0000 [IU] | Freq: Once | INTRAVENOUS | Status: AC
  Administered 2017-06-02: 3400 [IU] via INTRAVENOUS
  Filled 2017-06-02: qty 3400

## 2017-06-02 MED ORDER — CHOLECALCIFEROL 10 MCG (400 UNIT) PO TABS
400.0000 [IU] | ORAL_TABLET | Freq: Every day | ORAL | Status: DC
  Administered 2017-06-02 – 2017-06-03 (×2): 400 [IU] via ORAL
  Filled 2017-06-02 (×2): qty 1

## 2017-06-02 MED ORDER — ACETAMINOPHEN 650 MG RE SUPP
650.0000 mg | Freq: Four times a day (QID) | RECTAL | Status: DC | PRN
Start: 2017-06-02 — End: 2017-06-03

## 2017-06-02 MED ORDER — SODIUM CHLORIDE 0.9 % IV SOLN
INTRAVENOUS | Status: DC
  Administered 2017-06-02 – 2017-06-03 (×2): via INTRAVENOUS

## 2017-06-02 MED ORDER — HEPARIN (PORCINE) IN NACL 100-0.45 UNIT/ML-% IJ SOLN
650.0000 [IU]/h | INTRAMUSCULAR | Status: DC
  Administered 2017-06-02: 700 [IU]/h via INTRAVENOUS
  Filled 2017-06-02: qty 250

## 2017-06-02 MED ORDER — LEVOTHYROXINE SODIUM 25 MCG PO TABS
75.0000 ug | ORAL_TABLET | Freq: Every day | ORAL | Status: DC
  Administered 2017-06-03: 75 ug via ORAL
  Filled 2017-06-02: qty 1

## 2017-06-02 MED ORDER — ASPIRIN 81 MG PO CHEW
81.0000 mg | CHEWABLE_TABLET | Freq: Every day | ORAL | Status: DC
  Filled 2017-06-02: qty 1

## 2017-06-02 MED ORDER — ONDANSETRON HCL 4 MG/2ML IJ SOLN: 4.0000 mg | Freq: Four times a day (QID) | INTRAMUSCULAR | Status: DC | PRN

## 2017-06-02 NOTE — Telephone Encounter (Signed)
I was going to try to reach pt again and saw per chart review tab pt went to [facility] ED.

## 2017-06-02 NOTE — ED Notes (Signed)
Pt arrives to ER via POV c/o chest pain with palpation and movement that started at approx 2AM today. Pt hx of GERD, states that she took Tums without relief. No pain at rest. No SOB, diaphoresis, NV.

## 2017-06-02 NOTE — Progress Notes (Signed)
ANTICOAGULATION CONSULT NOTE - Initial Consult  Pharmacy Consult for heparin drip management  Indication: chest pain/ACS  Allergies  Allergen Reactions  . Codeine Phosphate Other (See Comments)    Dizziness, heart palpitations, nervous  . Flonase [Fluticasone Propionate] Other (See Comments)    Worsens her migraine   . Hydrocodone     REACTION: made nervous--10/09 surgery rotator cuff    Patient Measurements: Height: 5\' 1"  (154.9 cm) Weight: 125 lb (56.7 kg) IBW/kg (Calculated) : 47.8  Vital Signs: Temp: 98.4 F (36.9 C) (07/11 0857) Temp Source: Oral (07/11 0857) BP: 133/71 (07/11 1000) Pulse Rate: 84 (07/11 1000)  Labs:  Recent Labs  06/02/17 0923  HGB 13.2  HCT 37.6  PLT 309  CREATININE 0.75  TROPONINI 0.16*    Estimated Creatinine Clearance: 56.4 mL/min (by C-G formula based on SCr of 0.75 mg/dL).   Medical History: Past Medical History:  Diagnosis Date  . Allergic rhinitis   . Cervical dysplasia   . Endometriosis   . Esophageal reflux   . Hypothyroid   . Migraine with aura, without mention of intractable migraine without mention of status migrainosus   . Osteopenia 11/2016   T score -2.2 FRAX 9.9%/1.6%    Medications:   (Not in a hospital admission)  Assessment: Pharmacy consulted for heparin drip management for 61 yo female admitted with possible NSTEMI. No noted anticoagulation as an outpatient.   Goal of Therapy:  Heparin level 0.3-0.7 units/ml Monitor platelets by anticoagulation protocol: Yes   Plan:  Give 3400 units bolus x 1 Start heparin infusion at 700 units/hr Check anti-Xa level in 6 hours and daily while on heparin Continue to monitor H&H and platelets  Simpson,Michael L 06/02/2017,10:25 AM

## 2017-06-02 NOTE — ED Notes (Signed)
Pt states that pain is "98% better" states there is still some heaviness and a "little prick" intermittent. EDP notified.Marland Kitchen

## 2017-06-02 NOTE — ED Provider Notes (Signed)
[facility] Emergency Department Provider Note  Time seen: 9:28 AM  I have reviewed the triage vital signs and the nursing notes.   HISTORY  Chief Complaint Chest Pain    HPI Jennifer [name] is a 61 y.o. female With a past medical history of gastric reflux, migraines, hypothyroidism who presents to the emergency department for chest discomfort. According to the patient for the past 2 days she has been experiencing some discomfort in her chest which she states feels somewhat like gastric reflux/indigestion however this morning it felt more tight and more so on the right side than the left side. Patient called her doctor who recommended she come to the ER for evaluatio. Denies any cardiac history. Denies any shortness of breath nausea or diaphoresis. Denies any leg pain or swelling. Describes her chest discomfort as very mild currently more of a pins and needles sensation.  Past Medical History:  Diagnosis Date  . Allergic rhinitis   . Cervical dysplasia   . Endometriosis   . Esophageal reflux   . Hypothyroid   . Migraine with aura, without mention of intractable migraine without mention of status migrainosus   . Osteopenia 11/2016   T score -2.2 FRAX 9.9%/1.6%    Patient Active Problem List   Diagnosis Date Noted  . Ear pain, bilateral 05/24/2017  . Viral URI with cough 01/25/2017  . Screening for diabetes mellitus 07/28/2016  . Allergic rhinitis 11/13/2014  . Adenopathy, cervical 10/30/2014  . Screening for lipoid disorders 09/26/2014  . Preventative health care 09/26/2014  . Routine general medical examination at a health care facility 10/28/2012  . Herpes labialis 04/28/2011  . GERD 09/16/2010  . Hypothyroidism 05/07/2008  . Osteopenia 05/07/2008  . Migraine with aura 11/11/2007    Past Surgical History:  Procedure Laterality Date  . BREAST SURGERY     Benign breast lump excised  . CERVICAL CONE BIOPSY  1985   severe dysplasia  . COLONOSCOPY   2005   normal  . COLPOSCOPY    . ROTATOR CUFF REPAIR  08/2008    Prior to Admission medications   Medication Sig Start Date End Date Taking? Authorizing Provider  ALPRAZolam (XANAX) 0.5 MG tablet TAKE 1/2 TO 1 TABLET BY MOUTH AS NEED FOR SLEEP OR ANXIETY 03/29/17   Tower, [name], MD  amoxicillin-clavulanate (AUGMENTIN) 875-125 MG tablet Take 1 tablet by mouth 2 (two) times daily. 05/24/17 06/03/17  Ria Bush, MD  atenolol (TENORMIN) 25 MG tablet Take by mouth.    [provider]  baclofen (LIORESAL) 10 MG tablet Take 10 mg by mouth as needed. For migraine prevention     [provider]  Calcium Carbonate-Vitamin D 600-400 MG-UNIT tablet Take 2 tablets by mouth daily. Totaling 1200 mg    [provider]  Cholecalciferol (VITAMIN D3) 400 units tablet Take by mouth daily. 950 mg    [provider]  ketorolac (TORADOL) 10 MG tablet Take 10 mg by mouth as needed. With food for Migraine    [provider]  levothyroxine (SYNTHROID, LEVOTHROID) 75 MCG tablet Take 1 tablet (75 mcg total) by mouth daily. 08/03/16   Tower, [name], MD  loratadine (CLARITIN) 10 MG tablet Take 10 mg by mouth daily.    [provider]  nabumetone (RELAFEN) 500 MG tablet Take 500 mg by mouth daily as needed.     [provider]  norethindrone-ethinyl estradiol (JINTELI) 1-5 MG-MCG TABS tablet Take 1 tablet by mouth daily. 11/10/16  Fontaine, Belinda Block, MD  Omega-3 Fatty Acids (FISH OIL) 1000 MG CAPS Take by mouth.    [provider]  pantoprazole (PROTONIX) 40 MG tablet  02/09/17   [provider]  predniSONE (DELTASONE) 10 MG tablet Take 3 pills once daily by mouth for 3 days, then 2 pills once daily for 3 days, then 1 pill once daily for 3 days and then stop 01/27/17   Tower, Benld A, MD  predniSONE (DELTASONE) 20 MG tablet TAPER AS DIRECTED OVER 4 DAYS, TAKING 4 TABS-3 TABS-2 TABS-1 TAB. TO BREAK 12/15/16   [provider]   predniSONE (DELTASONE) 20 MG tablet Take two tablets daily for 4 days 05/24/17   Ria Bush, MD  promethazine (PHENERGAN) 25 MG tablet Take 25 mg by mouth as needed. When having a migraine    [provider]  ranitidine (ZANTAC) 150 MG tablet Take 1 tablet (150 mg total) by mouth 2 (two) times daily. 03/15/17 05/15/17  Jonathon Bellows, MD  rizatriptan (MAXALT) 10 MG tablet Take 10 mg by mouth as needed. May repeat in 2 hours if needed    [provider]  traMADol (ULTRAM) 50 MG tablet Take 1 tablet (50 mg total) by mouth every 8 (eight) hours as needed (cough). Caution of sedation 01/26/17   Tower, [name], MD  valACYclovir (VALTREX) 1000 MG tablet Take 2 tablets (2,000 mg total) by mouth 2 (two) times daily. 05/24/17   Ria Bush, MD    Allergies  Allergen Reactions  . Codeine Phosphate Other (See Comments)    Dizziness, heart palpitations, nervous  . Flonase [Fluticasone Propionate] Other (See Comments)    Worsens her migraine   . Hydrocodone     REACTION: made nervous--10/09 surgery rotator cuff    Family History  Problem Relation Age of Onset  . Breast cancer Mother 6  . Diabetes Mother   . Hypertension Father   . Heart disease Father   . Osteoporosis Maternal Grandmother   . Diabetes Maternal Grandmother   . Hypertension Sister     Social History Social History  Substance Use Topics  . Smoking status: Never Smoker  . Smokeless tobacco: Never Used  . Alcohol use No    Review of Systems Constitutional: Negative for fever. Cardiovascular: Positive for chest discomfort/sinus Respiratory: Negative for shortness of breath. Gastrointestinal: Negative for abdominal pain Musculoskeletal: Negative for back pain. Negative for leg pain or swelling. Neurological: Negative for headache All other ROS negative  ____________________________________________   PHYSICAL EXAM:  VITAL SIGNS: ED Triage Vitals  Enc Vitals Group     BP 06/02/17 0857 (!) 152/72      Pulse Rate 06/02/17 0857 67     Resp 06/02/17 0857 17     Temp 06/02/17 0857 98.4 F (36.9 C)     Temp Source 06/02/17 0857 Oral     SpO2 06/02/17 0857 99 %     Weight 06/02/17 0856 125 lb (56.7 kg)     Height 06/02/17 0856 5\' 1"  (1.549 m)     Head Circumference --      Peak Flow --      Pain Score 06/02/17 0855 4     Pain Loc --      Pain Edu? --      Excl. in [place]? --    Constitutional: Alert and oriented. Well appearing and in no distress. Eyes: Normal exam ENT   Head: Normocephalic and atraumatic.   Mouth/Throat: Mucous membranes are moist. Cardiovascular: Normal rate, regular  rhythm. No murmur Respiratory: Normal respiratory effort without tachypnea nor retractions. Breath sounds are clear  Gastrointestinal: Soft and nontender. No distention. Musculoskeletal: Nontender with normal range of motion in all extremities. No lower extremity tenderness or edema. Neurologic:  Normal speech and language. No gross focal neurologic deficits Skin:  Skin is warm, dry and intact.  Psychiatric: Mood and affect are normal.  ____________________________________________    EKG  EKG reviewed and interpreted by myself shows normal sinus rhythm at 68 bpm, narrow QRS, normal axis, normal intervals, no concerning ST changes.  ____________________________________________    RADIOLOGY  Chest x-ray negative  ____________________________________________   INITIAL IMPRESSION / ASSESSMENT AND PLAN / ED COURSE  Pertinent labs & imaging results that were available during my care of the patient were reviewed by me and considered in my medical decision making (see chart for details).  Patient presents to the emergency department for chest discomfort. Patient states a significant history of gastric reflux, takes Protonix daily, thought this could be reflux related she took Tums but experienced minimal relief. She did state she felt somewhat better after drinking milk. This morning the  chest pain was more of a tightness sensation she called her doctor who recommended she come to the ER for evaluation. Currently the patient appears well, no distress. States very slight discomfort currently. We will check labs, EKG, chest x-ray and treat with a GI cocktail.  Troponin is elevated at 0.16, labs otherwise negative. Chest x-ray negative, EKG is reassuring. However given the patient's chest pain with an elevated troponin 0.16 we will start the patient on a heparin drip and admitted to the hospital for NSTEMI. I discussed this with the patient who is agreeable to this plan of care.  CRITICAL CARE Performed by: Harvest Dark   Total critical care time: 30 minutes  Critical care time was exclusive of separately billable procedures and treating other patients.  Critical care was necessary to treat or prevent imminent or life-threatening deterioration.  Critical care was time spent personally by me on the following activities: development of treatment plan with patient and/or surrogate as well as nursing, discussions with consultants, evaluation of patient's response to treatment, examination of patient, obtaining history from patient or surrogate, ordering and performing treatments and interventions, ordering and review of laboratory studies, ordering and review of radiographic studies, pulse oximetry and re-evaluation of patient's condition.  ____________________________________________   FINAL CLINICAL IMPRESSION(S) / ED DIAGNOSES  Chest discomfort NSTEMI   Harvest Dark, MD 06/02/17 1017

## 2017-06-02 NOTE — Consult Note (Signed)
Cardiology Consult    Patient ID: Jennifer [name] MRN: [id number], DOB/AGE: 02-02-1956   Admit date: 06/02/2017 Date of Consult: 06/02/2017  Primary Physician: Tower, [name], MD Reason for Consult: Chest Pain Primary Cardiologist: New to [facility] - Dr. Rockey Situ Requesting Provider: Dr. Tressia Miners   History of Present [name] is a 61 y.o. female with past medical history of HTN, GERD, Hypothyroidism and  Osteopenia who is being seen today for the evaluation of chest pain at the request of Dr. Tressia Miners.   She [facility] ED on 06/02/2017 for evaluation of new-onset chest pain. In talking with the patient today, she reports having initial episodes of chest pain occurring around midnight. Says she had added fruit to her nighttime snack and assumed her pain was secondary to reflux. She describes it as a "pricky" sensation which lasted for several minutes then lessened and she was able to return to sleep. This morning, she awoke with a similar sensation but reports there was a pressure as well. She called her PCP who recommended she proceed to the ED for further evaluation.   She was given a GI cocktail and reports this brought her pain down 98%. She still notes pain which is worse with sitting up or bringing in her arms into her chest.   She denies any recent exertional chest pain or dyspnea on exertion. No orthopnea, PND, or lower extremity edema. She denies any personal history of CAD. 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 Does report being under increased stress secondary to her job and the death of her husband occurring 4 years prior. She denies any history of alcohol use or tobacco use.  Initial labs show WBC of 6.8, Hgb 13.2, platelets 309. Na+ 137, K+ 3.6, creatinine 0.75. Initial troponin 0.16. Lipid Panel showing total cholesterol of 166, HDL 52, and LDL of 98. CXR shows no acute abnormalities. EKG shows NSR, HR 68, and isolated TWI along  Lead III.    Past Medical History   Past Medical History:  Diagnosis Date  . Allergic rhinitis   . Cervical dysplasia   . Endometriosis   . Esophageal reflux   . Hypothyroid   . Migraine with aura, without mention of intractable migraine without mention of status migrainosus   . Osteopenia 11/2016   T score -2.2 FRAX 9.9%/1.6%    Past Surgical History:  Procedure Laterality Date  . BREAST SURGERY     Benign breast lump excised  . CERVICAL CONE BIOPSY  1985   severe dysplasia  . COLONOSCOPY  2005   normal  . COLPOSCOPY    . ROTATOR CUFF REPAIR  08/2008     Allergies  Allergies  Allergen Reactions  . Codeine Phosphate Other (See Comments)    Dizziness, heart palpitations, nervous  . Flonase [Fluticasone Propionate] Other (See Comments)    Worsens her migraine   . Hydrocodone     REACTION: made nervous--10/09 surgery rotator cuff    Inpatient Medications      Family History    Family History  Problem Relation Age of Onset  . Breast cancer Mother 41  . Diabetes Mother   . Hypertension Father   . Heart disease Father   . Osteoporosis Maternal Grandmother   . Diabetes Maternal Grandmother   . Hypertension Sister     Social History    Social History   Social History  . Marital status: Widowed  Spouse name: N/A  . Number of children: N/A  . 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 Smoking status: Never Smoker  . Smokeless tobacco: Never Used  . Alcohol use No  . Drug use: No  . Sexual activity: Not Currently    Birth control/ protection: Post-menopausal     Comment: 1st intercourse 54 yo-5 partners   Other Topics Concern  . Not on file   Social History Narrative   [place] her husband in 9/14     Review of Systems    General:  No chills, fever, night sweats or weight changes.  Cardiovascular:  No dyspnea on exertion, edema, orthopnea, palpitations, paroxysmal nocturnal  dyspnea. Positive for chest pain.  Dermatological: No rash, lesions/masses Respiratory: No cough, dyspnea Urologic: No hematuria, dysuria Abdominal:   No nausea, vomiting, diarrhea, bright red blood per rectum, melena, or hematemesis Neurologic:  No visual changes, wkns, changes in mental status. All other systems reviewed and are otherwise negative except as noted above.  Physical Exam    Blood pressure (!) 126/56, pulse 65, temperature 98.4 F (36.9 C), temperature source Oral, resp. rate (!) 21, height 5\' 1"  (1.549 m), weight 125 lb (56.7 kg), SpO2 100 %.  General: Pleasant, Caucasian female appearing in NAD Psych: Normal affect. Neuro: Alert and oriented X 3. Moves all extremities spontaneously. HEENT: Normal  Neck: Supple without bruits or JVD. Lungs:  Resp regular and unlabored, CTA without wheezing or rales. Heart: RRR no s3, s4, or murmurs. Tender to palpation along right pectoral region and sternum.  Abdomen: Soft, non-tender, non-distended, BS + x 4.  Extremities: No clubbing, cyanosis or edema. DP/PT/Radials 2+ and equal bilaterally.  Labs    Troponin (Point of Care Test) No results for input(s): TROPIPOC in the last 72 hours.  Recent Labs  06/02/17 0923  TROPONINI 0.16*   Lab Results  Component Value Date   WBC 6.8 06/02/2017   HGB 13.2 06/02/2017   HCT 37.6 06/02/2017   MCV 85.8 06/02/2017   PLT 309 06/02/2017    Recent Labs Lab 06/02/17 0923  NA 137  K 3.6  CL 104  CO2 25  BUN 16  CREATININE 0.75  CALCIUM 9.4  GLUCOSE 106*   Lab Results  Component Value Date   CHOL 166 06/02/2017   HDL 52 06/02/2017   LDLCALC 98 06/02/2017   TRIG 79 06/02/2017   No results found for: [facility]   Radiology Studies    Dg Chest 2 View  Result Date: 06/02/2017 CLINICAL DATA:  Chest pain, burning and tightness since 0200 hours, history GERD, endometriosis, migraines EXAM: CHEST  2 VIEW COMPARISON:  10/27/2005 FINDINGS: Normal heart size, mediastinal contours, and  pulmonary vascularity. Lungs clear. No pleural effusion or pneumothorax. Bones unremarkable. IMPRESSION: No acute abnormalities. Electronically Signed   By: Lavonia Dana M.D.   On: 06/02/2017 09:26    EKG & Cardiac Imaging    EKG:  NSR, HR 68, and isolated TWI along Lead III.  - Personally Reviewed  Echocardiogram: None on File  Assessment & Plan    1. Atypical Chest Pain/ Elevated Troponin - the patient reports developing chest pain occurring around midnight which she describes as a "pricky" sensation which lasted for several minutes then lessened and she was able to return to sleep. The pain represented later in the morning and she described this as a pressure. Was given a GI cocktail while in the  ED which almost fully relieved his symptoms. Pain is reproducible on palpation and worse with positional changes.  - initial troponin 0.16. EKG shows NSR, HR 68, and isolated TWI along Lead III.  - continue to cycle cardiac enzymes. She has been started on IV Heparin. Keep NPO after midnight. If flat trend, would favor NST in the setting of her atypical symptoms. If enzymes become significantly elevated, she will need a cardiac catheterization for definitive evaluation.   2. HTN - BP at 108/56 - 152/76 since arrival to the ED.  - continue PTA Atenolol 25mg  daily.   3. GERD - continue Protonix 40mg  daily.   4. Hypothyroidism - TSH 3.71 in 07/2016 at last check.  - on Synthroid 75 mcg daily.   Signed, Erma Heritage, PA-C 06/02/2017, 12:53 PM Pager: [phone]

## 2017-06-02 NOTE — ED Triage Notes (Signed)
Pt reports centralized chest pain that started yesterday, today now radiates to the right side. Pt thought last night it was indigestion, took tums and milk with no relief. Pt describes pain as tightness today.

## 2017-06-02 NOTE — Telephone Encounter (Signed)
Unable to reach pt by phone by any contact #. I spoke with Vergia Alcon (DPR signed) at [phone] and the only contact # she had was same # I had.

## 2017-06-02 NOTE — Telephone Encounter (Signed)
Patient Name: Jennifer [name] DOB: [birth year] Initial Comment Caller states she has been having pain in middle of chest all night Nurse Assessment Nurse: Ronnald Ramp, RN, Miranda Date/Time (Eastern Time): 06/02/2017 8:23:56 AM Confirm and document reason for call. If symptomatic, describe symptoms. ---Caller states she has been having chest pain for the last 8 hrs. She feels it is from her reflux. Does the patient have any new or worsening symptoms? ---Yes Will a triage be completed? ---Yes Related visit to physician within the last 2 weeks? ---No Does the PT have any chronic conditions? (i.e. diabetes, asthma, etc.) ---Yes List chronic conditions. ---GERD, Thyroid, Is this a behavioral health or substance abuse call? ---No Guidelines Guideline Title Affirmed Question Affirmed Notes Chest Pain [1] Chest pain lasts > 5 minutes AND [2] described as crushing, pressure-like, or heavy Final Disposition User Call EMS 911 Now Ronnald Ramp, RN, [facility] - ED Disagree/Comply: Disagree Disagree/Comply Reason: Disagree with instructions

## 2017-06-02 NOTE — Progress Notes (Signed)
*  PRELIMINARY RESULTS* Echocardiogram 2D Echocardiogram has been performed.  Jennifer [name] 06/02/2017, 3:25 PM

## 2017-06-02 NOTE — Progress Notes (Addendum)
ANTICOAGULATION CONSULT NOTE - Initial Consult  Pharmacy Consult for heparin drip management  Indication: chest pain/ACS  Allergies  Allergen Reactions  . Codeine Phosphate Other (See Comments)    Dizziness, heart palpitations, nervous  . Flonase [Fluticasone Propionate] Other (See Comments)    Worsens her migraine   . Hydrocodone     REACTION: made nervous--10/09 surgery rotator cuff    Patient Measurements: Height: 5\' 1"  (154.9 cm) Weight: 125 lb (56.7 kg) IBW/kg (Calculated) : 47.8  Vital Signs: Temp: 98.3 F (36.8 C) (07/11 1423) Temp Source: Oral (07/11 1423) BP: 108/56 (07/11 1423) Pulse Rate: 59 (07/11 1423)  Labs:  Recent Labs  06/02/17 0923 06/02/17 1023 06/02/17 1452 06/02/17 1745  HGB 13.2  --   --   --   HCT 37.6  --   --   --   PLT 309  --   --   --   APTT  --  28  --   --   LABPROT  --  12.9  --   --   INR  --  0.97  --   --   HEPARINUNFRC  --   --   --  0.68  CREATININE 0.75  --   --   --   TROPONINI 0.16*  --  0.16*  --     Estimated Creatinine Clearance: 56.4 mL/min (by C-G formula based on SCr of 0.75 mg/dL).   Medical History: Past Medical History:  Diagnosis Date  . Allergic rhinitis   . Cervical dysplasia   . Endometriosis   . Esophageal reflux   . Hypothyroid   . Migraine with aura, without mention of intractable migraine without mention of status migrainosus   . Osteopenia 11/2016   T score -2.2 FRAX 9.9%/1.6%    Assessment: Pharmacy consulted for heparin drip management for 61 yo female admitted with possible NSTEMI. No noted anticoagulation as an outpatient.   Goal of Therapy:  Heparin level 0.3-0.7 units/ml Monitor platelets by anticoagulation protocol: Yes   Plan:  Give 3400 units bolus x 1 Start heparin infusion at 700 units/hr Check anti-Xa level in 6 hours and daily while on heparin Continue to monitor H&H and platelets   7/11 1900 AntiXa level at high end of therapeutic range; 0.68. Will decrease heparin dose to  650 U/hr and recheck level in 6 hours. CBC ordered with AM labs per protocol.   Pernell Dupre, PharmD, BCPS Clinical Pharmacist 06/02/2017 7:15 PM    7/12 02:30 heparin level 0.30. Repeat in 6 hours to confirm.  Sim Boast, PharmD, BCPS  06/03/17 3:12 AM

## 2017-06-02 NOTE — H&P (Signed)
[place] [facility] [place] NAME: Jennifer [name]    MR#:  [id number]  DATE OF BIRTH:  August 30, 1956  DATE OF ADMISSION:  06/02/2017  PRIMARY CARE PHYSICIAN: Tower, [name], MD   REQUESTING/REFERRING PHYSICIAN: Dr. Harvest Dark  CHIEF COMPLAINT:   Chief Complaint  Patient presents with  . Chest Pain    HISTORY OF PRESENT ILLNESS:  Jennifer [name]  is a 61 y.o. female with a known history of GERD, hypothyroidism, migraine headaches presents to hospital secondary to chest tightness that started this morning. Patient has had trouble with reflux in the past and has been seen by GI. Continues to take PPI. One month ago she had a prickly pain in the chest associated with some tightness, was seen by a PCP and was recommended to take GI cocktail. Symptoms have surprisingly relieved at the time. This morning around 2 AM she woke up with chest pain initially thought to be reflux, Fenton had some milk and Tums with some relief and went back to bed. She woke up again with chest tightness associated with nausea and diaphoresis. However her pain was getting worse with positional changes. Received an aspirin in the emergency room with worsening of pain, however pain improved with GI cocktail again. Also was treated with antibiotics 2 weeks ago for possible left ear infection followed by myalgias of neck muscles. EKG without any acute findings however troponin was elevated at 0.16 she is being admitted. Denies any other complaints.  PAST MEDICAL HISTORY:   Past Medical History:  Diagnosis Date  . Allergic rhinitis   . Cervical dysplasia   . Endometriosis   . Esophageal reflux   . Hypothyroid   . Migraine with aura, without mention of intractable migraine without mention of status migrainosus   . Osteopenia 11/2016   T score -2.2 FRAX 9.9%/1.6%    PAST SURGICAL HISTORY:   Past Surgical History:  Procedure Laterality Date  . BREAST SURGERY     Benign breast lump  excised  . CERVICAL CONE BIOPSY  1985   severe dysplasia  . COLONOSCOPY  2005   normal  . COLPOSCOPY    . ROTATOR CUFF REPAIR  08/2008    SOCIAL HISTORY:   Social History  Substance Use Topics  . Smoking status: Never Smoker  . Smokeless tobacco: Never Used  . Alcohol use No    FAMILY HISTORY:   Family History  Problem Relation Age of Onset  . Breast cancer Mother 32  . Diabetes Mother   . Hypertension Father   . Heart disease Father   . Osteoporosis Maternal Grandmother   . Diabetes Maternal Grandmother   . Hypertension Sister     DRUG ALLERGIES:   Allergies  Allergen Reactions  . Codeine Phosphate Other (See Comments)    Dizziness, heart palpitations, nervous  . Flonase [Fluticasone Propionate] Other (See Comments)    Worsens her migraine   . Hydrocodone     REACTION: made nervous--10/09 surgery rotator cuff    REVIEW OF SYSTEMS:   Review of Systems  Constitutional: Positive for diaphoresis. Negative for chills, fever, malaise/fatigue and weight loss.  HENT: Negative for ear discharge, ear pain, hearing loss, nosebleeds and tinnitus.   Eyes: Negative for blurred vision, double vision and photophobia.  Respiratory: Negative for cough, hemoptysis, shortness of breath and wheezing.   Cardiovascular: Positive for chest pain. Negative for palpitations, orthopnea and leg swelling.  Gastrointestinal: Positive for heartburn and nausea. Negative for abdominal  pain, constipation, diarrhea, melena and vomiting.  Genitourinary: Negative for dysuria, frequency, hematuria and urgency.  Musculoskeletal: Negative for back pain, myalgias and neck pain.  Skin: Negative for rash.  Neurological: Negative for dizziness, tingling, tremors, sensory change, speech change, focal weakness and headaches.  Endo/Heme/Allergies: Does not bruise/bleed easily.  Psychiatric/Behavioral: Negative for depression.    MEDICATIONS AT HOME:   Prior to Admission medications   Medication Sig  Start Date End Date Taking? Authorizing Provider  atenolol (TENORMIN) 25 MG tablet Take 25 mg by mouth daily.    Yes [provider]  Calcium Carbonate-Vitamin D 600-400 MG-UNIT tablet Take 2 tablets by mouth daily. Totaling 1200 mg   Yes [provider]  Cholecalciferol (VITAMIN D3) 400 units tablet Take by mouth daily. 950 mg   Yes [provider]  levothyroxine (SYNTHROID, LEVOTHROID) 75 MCG tablet Take 1 tablet (75 mcg total) by mouth daily. 08/03/16  Yes Tower, [name], MD  loratadine (CLARITIN) 10 MG tablet Take 10 mg by mouth daily.   Yes [provider]  Omega-3 Fatty Acids (FISH OIL) 1000 MG CAPS Take by mouth.   Yes [provider]  pantoprazole (PROTONIX) 40 MG tablet 40 mg daily.  02/09/17  Yes [provider]  ALPRAZolam (XANAX) 0.5 MG tablet TAKE 1/2 TO 1 TABLET BY MOUTH AS NEED FOR SLEEP OR ANXIETY 03/29/17   Tower, [name], MD  amoxicillin-clavulanate (AUGMENTIN) 875-125 MG tablet Take 1 tablet by mouth 2 (two) times daily. Patient not taking: Reported on 06/02/2017 05/24/17 06/03/17  Ria Bush, MD  baclofen (LIORESAL) 10 MG tablet Take 10 mg by mouth as needed. For migraine prevention     [provider]  ketorolac (TORADOL) 10 MG tablet Take 10 mg by mouth as needed. With food for Migraine    [provider]  nabumetone (RELAFEN) 500 MG tablet Take 500 mg by mouth daily as needed.     [provider]  norethindrone-ethinyl estradiol (JINTELI) 1-5 MG-MCG TABS tablet Take 1 tablet by mouth daily. 11/10/16   Fontaine, Belinda Block, MD  predniSONE (DELTASONE) 10 MG tablet Take 3 pills once daily by mouth for 3 days, then 2 pills once daily for 3 days, then 1 pill once daily for 3 days and then stop Patient not taking: Reported on 06/02/2017 01/27/17   Tower, [name], MD  predniSONE (DELTASONE) 20 MG tablet TAPER AS DIRECTED OVER 4 DAYS, TAKING 4 TABS-3 TABS-2 TABS-1 TAB. TO BREAK 12/15/16   [provider]  predniSONE (DELTASONE) 20 MG tablet Take two tablets daily for 4 days Patient not taking: Reported on 06/02/2017 05/24/17   Ria Bush, MD  promethazine (PHENERGAN) 25 MG tablet Take 25 mg by mouth as needed. When having a migraine    [provider]  ranitidine (ZANTAC) 150 MG tablet Take 1 tablet (150 mg total) by mouth 2 (two) times daily. 03/15/17 05/15/17  Jonathon Bellows, MD  rizatriptan (MAXALT) 10 MG tablet Take 10 mg by mouth as needed. May repeat in 2 hours if needed    [provider]  traMADol (ULTRAM) 50 MG tablet Take 1 tablet (50 mg total) by mouth every 8 (eight) hours as needed (cough). Caution of sedation Patient not taking: Reported on 06/02/2017 01/26/17   Tower, [name], MD  valACYclovir (VALTREX) 1000 MG tablet Take 2 tablets (2,000 mg total) by mouth 2 (two) times daily. 05/24/17   Ria Bush, MD      VITAL SIGNS:  Blood pressure  111/75, pulse (!) 59, temperature 98.4 F (36.9 C), temperature source Oral, resp. rate 10, height 5\' 1"  (1.549 m), weight 56.7 kg (125 lb), SpO2 100 %.  PHYSICAL EXAMINATION:   Physical Exam  GENERAL:  61 y.o.-year-old patient lying in the bed with no acute distress.  EYES: Pupils equal, round, reactive to light and accommodation. No scleral icterus. Extraocular muscles intact.  HEENT: Head atraumatic, normocephalic. Oropharynx and nasopharynx clear.  NECK:  Supple, no jugular venous distention. No thyroid enlargement, no tenderness.  LUNGS: Normal breath sounds bilaterally, no wheezing, rales,rhonchi or crepitation. No use of accessory muscles of respiration.  CARDIOVASCULAR: S1, S2 normal. No murmurs, rubs, or gallops.  ABDOMEN: Soft, nontender, nondistended. Bowel sounds present. No organomegaly or mass.  EXTREMITIES: No pedal edema, cyanosis, or clubbing.  NEUROLOGIC: Cranial nerves II through XII are intact. Muscle strength 5/5 in all extremities. Sensation intact. Gait not checked.  PSYCHIATRIC: The patient is  alert and oriented x 3.  SKIN: No obvious rash, lesion, or ulcer.   LABORATORY PANEL:   CBC  Recent Labs Lab 06/02/17 0923  WBC 6.8  HGB 13.2  HCT 37.6  PLT 309   ------------------------------------------------------------------------------------------------------------------  Chemistries   Recent Labs Lab 06/02/17 0923  NA 137  K 3.6  CL 104  CO2 25  GLUCOSE 106*  BUN 16  CREATININE 0.75  CALCIUM 9.4   ------------------------------------------------------------------------------------------------------------------  Cardiac Enzymes  Recent Labs Lab 06/02/17 0923  TROPONINI 0.16*   ------------------------------------------------------------------------------------------------------------------  RADIOLOGY:  Dg Chest 2 View  Result Date: 06/02/2017 CLINICAL DATA:  Chest pain, burning and tightness since 0200 hours, history GERD, endometriosis, migraines EXAM: CHEST  2 VIEW COMPARISON:  10/27/2005 FINDINGS: Normal heart size, mediastinal contours, and pulmonary vascularity. Lungs clear. No pleural effusion or pneumothorax. Bones unremarkable. IMPRESSION: No acute abnormalities. Electronically Signed   By: Lavonia Dana M.D.   On: 06/02/2017 09:26    EKG:   Orders placed or performed during the hospital encounter of 06/02/17  . EKG 12-Lead  . EKG 12-Lead  . ED EKG within 10 minutes  . ED EKG within 10 minutes    IMPRESSION AND PLAN:   Ambry Dix  is a 61 y.o. female with a known history of GERD, hypothyroidism, migraine headaches presents to hospital secondary to chest tightness that started this morning.  #1 chest pain-NSTEMI due to elevated troponin. -However viral myocarditis and GERD cannot be completely ruled out. No significant risk factors and no family history. -Admit to telemetry, recycle troponins. Cardiology has been consulted and will keep nothing by mouth after midnight -Started on heparin drip -Continue aspirin, check lipid panel. Patient  already on atenolol which will continue.  #2 anxiety-continue Xanax as needed  #3 menopausal symptoms-on hormone supplements. We'll continue  #4 hypothyroidism-continue Synthroid.  #5 DVT prophylaxis-currently on heparin drip   All the records are reviewed and case discussed with ED provider. Management plans discussed with the patient, family and they are in agreement.  CODE STATUS: Full code  TOTAL TIME TAKING CARE OF THIS PATIENT: 50 minutes.    Gladstone Lighter M.D on 06/02/2017 at 1:02 PM  Between 7am to 6pm - Pager - [phone]  After 6pm go to [email or web address] - password [email or web address]  Office  [phone]  CC: Primary care physician; Tower, [name], MD

## 2017-06-02 NOTE — ED Notes (Signed)
Patient transported to X-ray 

## 2017-06-03 ENCOUNTER — Inpatient Hospital Stay (HOSPITAL_BASED_OUTPATIENT_CLINIC_OR_DEPARTMENT_OTHER): Payer: BLUE CROSS/BLUE SHIELD

## 2017-06-03 DIAGNOSIS — R071 Chest pain on breathing: Secondary | ICD-10-CM

## 2017-06-03 DIAGNOSIS — R079 Chest pain, unspecified: Secondary | ICD-10-CM

## 2017-06-03 LAB — BASIC METABOLIC PANEL
ANION GAP: 7 (ref 5–15)
BUN: 15 mg/dL (ref 6–20)
CALCIUM: 8.9 mg/dL (ref 8.9–10.3)
CO2: 25 mmol/L (ref 22–32)
Chloride: 107 mmol/L (ref 101–111)
Creatinine, Ser: 0.71 mg/dL (ref 0.44–1.00)
Glucose, Bld: 103 mg/dL — ABNORMAL HIGH (ref 65–99)
Potassium: 3.8 mmol/L (ref 3.5–5.1)
SODIUM: 139 mmol/L (ref 135–145)

## 2017-06-03 LAB — NM MYOCAR MULTI W/SPECT W/WALL MOTION / EF
CHL CUP NUCLEAR SSS: 0
CHL CUP RESTING HR STRESS: 60 {beats}/min
CSEPHR: 73 %
CSEPPHR: 117 {beats}/min
LV dias vol: 28 mL (ref 46–106)
LVSYSVOL: 5 mL
SDS: 0
SRS: 0
TID: 1.21

## 2017-06-03 LAB — CBC
HCT: 35.8 % (ref 35.0–47.0)
Hemoglobin: 12.4 g/dL (ref 12.0–16.0)
MCH: 30.3 pg (ref 26.0–34.0)
MCHC: 34.6 g/dL (ref 32.0–36.0)
MCV: 87.4 fL (ref 80.0–100.0)
PLATELETS: 285 10*3/uL (ref 150–440)
RBC: 4.09 MIL/uL (ref 3.80–5.20)
RDW: 12.9 % (ref 11.5–14.5)
WBC: 6.3 10*3/uL (ref 3.6–11.0)

## 2017-06-03 LAB — HEMOGLOBIN A1C
Hgb A1c MFr Bld: 5.8 % — ABNORMAL HIGH (ref 4.8–5.6)
Mean Plasma Glucose: 120 mg/dL

## 2017-06-03 LAB — GLUCOSE, CAPILLARY: GLUCOSE-CAPILLARY: 124 mg/dL — AB (ref 65–99)

## 2017-06-03 LAB — HIV ANTIBODY (ROUTINE TESTING W REFLEX): HIV Screen 4th Generation wRfx: NONREACTIVE

## 2017-06-03 LAB — HEPARIN LEVEL (UNFRACTIONATED)
HEPARIN UNFRACTIONATED: 0.37 [IU]/mL (ref 0.30–0.70)
Heparin Unfractionated: 0.3 IU/mL (ref 0.30–0.70)

## 2017-06-03 LAB — TROPONIN I: TROPONIN I: 0.15 ng/mL — AB (ref <0.03)

## 2017-06-03 MED ORDER — TECHNETIUM TC 99M TETROFOSMIN IV KIT
31.8900 | PACK | Freq: Once | INTRAVENOUS | Status: AC | PRN
  Administered 2017-06-03: 31.89 via INTRAVENOUS

## 2017-06-03 MED ORDER — TECHNETIUM TC 99M TETROFOSMIN IV KIT
13.0000 | PACK | Freq: Once | INTRAVENOUS | Status: AC | PRN
  Administered 2017-06-03: 12.36 via INTRAVENOUS

## 2017-06-03 MED ORDER — RIZATRIPTAN BENZOATE 10 MG PO TABS
10.0000 mg | ORAL_TABLET | Freq: Once | ORAL | Status: AC | PRN
  Administered 2017-06-03: 10 mg via ORAL
  Filled 2017-06-03: qty 1

## 2017-06-03 MED ORDER — ASPIRIN 81 MG PO CHEW: 81.0000 mg | CHEWABLE_TABLET | Freq: Every day | ORAL | 0 refills | Status: DC

## 2017-06-03 MED ORDER — REGADENOSON 0.4 MG/5ML IV SOLN
0.4000 mg | Freq: Once | INTRAVENOUS | Status: AC
  Administered 2017-06-03: 0.4 mg via INTRAVENOUS

## 2017-06-03 NOTE — Progress Notes (Signed)
To nuclear medicine via bed 

## 2017-06-03 NOTE — Progress Notes (Signed)
ANTICOAGULATION CONSULT NOTE - Initial Consult  Pharmacy Consult for heparin drip management  Indication: chest pain/ACS  Allergies  Allergen Reactions  . Codeine Phosphate Other (See Comments)    Dizziness, heart palpitations, nervous  . Flonase [Fluticasone Propionate] Other (See Comments)    Worsens her migraine   . Hydrocodone     REACTION: made nervous--10/09 surgery rotator cuff    Patient Measurements: Height: 5\' 1"  (154.9 cm) Weight: 125 lb (56.7 kg) IBW/kg (Calculated) : 47.8  Vital Signs: Temp: 98.4 F (36.9 C) (07/12 0750) Temp Source: Oral (07/12 0750) BP: 116/47 (07/12 0752) Pulse Rate: 63 (07/12 0752)  Labs:  Recent Labs  06/02/17 0923 06/02/17 1023 06/02/17 1452 06/02/17 1745 06/02/17 2027 06/03/17 0224 06/03/17 0849  HGB 13.2  --   --   --   --  12.4  --   HCT 37.6  --   --   --   --  35.8  --   PLT 309  --   --   --   --  285  --   APTT  --  28  --   --   --   --   --   LABPROT  --  12.9  --   --   --   --   --   INR  --  0.97  --   --   --   --   --   HEPARINUNFRC  --   --   --  0.68  --  0.30 0.37  CREATININE 0.75  --   --   --   --  0.71  --   TROPONINI 0.16*  --  0.16*  --  0.15* 0.15*  --     Estimated Creatinine Clearance: 56.4 mL/min (by C-G formula based on SCr of 0.71 mg/dL).   Medical History: Past Medical History:  Diagnosis Date  . Allergic rhinitis   . Cervical dysplasia   . Endometriosis   . Esophageal reflux   . Hypothyroid   . Migraine with aura, without mention of intractable migraine without mention of status migrainosus   . Osteopenia 11/2016   T score -2.2 FRAX 9.9%/1.6%    Assessment: Pharmacy consulted for heparin drip management for 61 yo female admitted with possible NSTEMI. No noted anticoagulation as an outpatient.   Goal of Therapy:  Heparin level 0.3-0.7 units/ml Monitor platelets by anticoagulation protocol: Yes   Plan:  Give 3400 units bolus x 1 Start heparin infusion at 700 units/hr Check  anti-Xa level in 6 hours and daily while on heparin Continue to monitor H&H and platelets   7/11 1900 AntiXa level at high end of therapeutic range; 0.68. Will decrease heparin dose to 650 U/hr and recheck level in 6 hours. CBC ordered with AM labs per protocol.   7/12 02:30 heparin level 0.30. Repeat in 6 hours to confirm.  7/12 0849 HL therapeutic. Continue current rate. Pharmacy will continue to monitor HL and CBC daily.  Smt Lokey A. Jordan Hawks, PharmD, BCPS  Clinical Pharmacist 06/03/17 9:38 AM

## 2017-06-03 NOTE — Progress Notes (Signed)
Back from nuclear medicine 

## 2017-06-03 NOTE — Progress Notes (Signed)
Pt discharged to home via wc.  Instructions  given to pt.  Questions answered.  No distress.  

## 2017-06-03 NOTE — Discharge Summary (Signed)
[facility] at [place] NAME: Jennifer [name]    MR#:  [id number]  DATE OF BIRTH:  Mar 30, 1956  DATE OF ADMISSION:  06/02/2017 ADMITTING PHYSICIAN: Gladstone Lighter, MD  DATE OF DISCHARGE: 06/03/2017  PRIMARY CARE PHYSICIAN: Tower, [name], MD    ADMISSION DIAGNOSIS:  NSTEMI (non-ST elevated myocardial infarction) ([place]) [I21.4]  DISCHARGE DIAGNOSIS:  Chest pain-atypical with mild elevated troponin GERD Chronic anxiety SECONDARY DIAGNOSIS:   Past Medical History:  Diagnosis Date  . Allergic rhinitis   . Cervical dysplasia   . Endometriosis   . Esophageal reflux   . Hypothyroid   . Migraine with aura, without mention of intractable migraine without mention of status migrainosus   . Osteopenia 11/2016   T score -2.2 FRAX 9.9%/1.6%    HOSPITAL COURSE:  Jennifer [name]  is a 61 y.o. female with a known history of GERD, hypothyroidism, migraine headaches presents to hospital secondary to chest tightness that started this morning.  #1 chest pain-appears atypical -mild Elevated troponin with normal EKG -Patient's chest pain is reproducible on the right costochondral area appears musculoskeletal -Was started on heparin drip---now discontinued -Troponin 0.16---0.16---0.15---0.15 -Continue aspirin - Patient already on atenolol which will continue. -Myoview stress test results pending. Patient does not have any severe chest pain at present  #2 anxiety-continue Xanax as needed  #3 menopausal symptoms-on hormone supplements.   #4 hypothyroidism-continue Synthroid.  #5 DVT prophylaxis-currently on heparin    Overall seems to be stable. Patient has a lot of anxiety and social stressors ongoing at this time. If her stress test remains negative she'll go home with outpatient follow-up with cardiology as needed CONSULTS OBTAINED:  Treatment Team:  Minna Merritts, MD  DRUG ALLERGIES:   Allergies  Allergen Reactions  . Codeine  Phosphate Other (See Comments)    Dizziness, heart palpitations, nervous  . Flonase [Fluticasone Propionate] Other (See Comments)    Worsens her migraine   . Hydrocodone     REACTION: made nervous--10/09 surgery rotator cuff    DISCHARGE MEDICATIONS:   Current Discharge Medication List    START taking these medications   Details  aspirin 81 MG chewable tablet Chew 1 tablet (81 mg total) by mouth daily. Qty: 30 tablet, Refills: 0      CONTINUE these medications which have NOT CHANGED   Details  atenolol (TENORMIN) 25 MG tablet Take 25 mg by mouth daily.     Calcium Carbonate-Vitamin D 600-400 MG-UNIT tablet Take 2 tablets by mouth daily. Totaling 1200 mg    Cholecalciferol (VITAMIN D3) 400 units tablet Take by mouth daily. 950 mg    levothyroxine (SYNTHROID, LEVOTHROID) 75 MCG tablet Take 1 tablet (75 mcg total) by mouth daily. Qty: 30 tablet, Refills: 11    loratadine (CLARITIN) 10 MG tablet Take 10 mg by mouth daily.    Omega-3 Fatty Acids (FISH OIL) 1000 MG CAPS Take by mouth.    pantoprazole (PROTONIX) 40 MG tablet 40 mg daily.  Refills: 5    ALPRAZolam (XANAX) 0.5 MG tablet TAKE 1/2 TO 1 TABLET BY MOUTH AS NEED FOR SLEEP OR ANXIETY Qty: 15 tablet, Refills: 0    baclofen (LIORESAL) 10 MG tablet Take 10 mg by mouth as needed. For migraine prevention     ketorolac (TORADOL) 10 MG tablet Take 10 mg by mouth as needed. With food for Migraine    nabumetone (RELAFEN) 500 MG tablet Take 500 mg by mouth daily as needed.  norethindrone-ethinyl estradiol (JINTELI) 1-5 MG-MCG TABS tablet Take 1 tablet by mouth daily. Qty: 84 tablet, Refills: 4    predniSONE (DELTASONE) 20 MG tablet TAPER AS DIRECTED OVER 4 DAYS, TAKING 4 TABS-3 TABS-2 TABS-1 TAB. TO BREAK Refills: 0    promethazine (PHENERGAN) 25 MG tablet Take 25 mg by mouth as needed. When having a migraine    ranitidine (ZANTAC) 150 MG tablet Take 1 tablet (150 mg total) by mouth 2 (two) times daily. Qty: 60  tablet, Refills: 2    rizatriptan (MAXALT) 10 MG tablet Take 10 mg by mouth as needed. May repeat in 2 hours if needed    valACYclovir (VALTREX) 1000 MG tablet Take 2 tablets (2,000 mg total) by mouth 2 (two) times daily. Qty: 4 tablet, Refills: 3      STOP taking these medications     amoxicillin-clavulanate (AUGMENTIN) 875-125 MG tablet      traMADol (ULTRAM) 50 MG tablet         If you experience worsening of your admission symptoms, develop shortness of breath, life threatening emergency, suicidal or homicidal thoughts you must seek medical attention immediately by calling 911 or calling your MD immediately  if symptoms less severe.  You Must read complete instructions/literature along with all the possible adverse reactions/side effects for all the Medicines you take and that have been prescribed to you. Take any new Medicines after you have completely understood and accept all the possible adverse reactions/side effects.   Please note  You were cared for by a hospitalist during your hospital stay. If you have any questions about your discharge medications or the care you received while you were in the hospital after you are discharged, you can call the unit and asked to speak with the hospitalist on call if the hospitalist that took care of you is not available. Once you are discharged, your primary care physician will handle any further medical issues. Please note that NO REFILLS for any discharge medications will be authorized once you are discharged, as it is imperative that you return to your primary care physician (or establish a relationship with a primary care physician if you do not have one) for your aftercare needs so that they can reassess your need for medications and monitor your lab values. Today   SUBJECTIVE   Out of the chair since eating lunch  VITAL SIGNS:  Blood pressure (!) 106/55, pulse 79, temperature 98 F (36.7 C), temperature source Oral, resp. rate 18,  height 5\' 1"  (1.549 m), weight 56.7 kg (125 lb), SpO2 98 %.  I/O:   Intake/Output Summary (Last 24 hours) at 06/03/17 1335 Last data filed at 06/03/17 1306  Gross per 24 hour  Intake          2215.12 ml  Output             3375 ml  Net         -1159.88 ml    PHYSICAL EXAMINATION:  GENERAL:  61 y.o.-year-old patient lying in the bed with no acute distress.  EYES: Pupils equal, round, reactive to light and accommodation. No scleral icterus. Extraocular muscles intact.  HEENT: Head atraumatic, normocephalic. Oropharynx and nasopharynx clear.  NECK:  Supple, no jugular venous distention. No thyroid enlargement, no tenderness.  LUNGS: Normal breath sounds bilaterally, no wheezing, rales,rhonchi or crepitation. No use of accessory muscles of respiration.  CARDIOVASCULAR: S1, S2 normal. No murmurs, rubs, or gallops. Reproducible pain around the right costochondral area ABDOMEN: Soft, non-tender, non-distended.  Bowel sounds present. No organomegaly or mass.  EXTREMITIES: No pedal edema, cyanosis, or clubbing.  NEUROLOGIC: Cranial nerves II through XII are intact. Muscle strength 5/5 in all extremities. Sensation intact. Gait not checked.  PSYCHIATRIC: The patient is alert and oriented x 3.  SKIN: No obvious rash, lesion, or ulcer.   DATA REVIEW:   CBC   Recent Labs Lab 06/03/17 0224  WBC 6.3  HGB 12.4  HCT 35.8  PLT 285    Chemistries   Recent Labs Lab 06/03/17 0224  NA 139  K 3.8  CL 107  CO2 25  GLUCOSE 103*  BUN 15  CREATININE 0.71  CALCIUM 8.9    Microbiology Results   No results found for this or any previous visit (from the past 240 hour(s)).  RADIOLOGY:  Dg Chest 2 View  Result Date: 06/02/2017 CLINICAL DATA:  Chest pain, burning and tightness since 0200 hours, history GERD, endometriosis, migraines EXAM: CHEST  2 VIEW COMPARISON:  10/27/2005 FINDINGS: Normal heart size, mediastinal contours, and pulmonary vascularity. Lungs clear. No pleural effusion or  pneumothorax. Bones unremarkable. IMPRESSION: No acute abnormalities. Electronically Signed   By: Lavonia Dana M.D.   On: 06/02/2017 09:26     Management plans discussed with the patient, family and they are in agreement.  CODE STATUS:     Code Status Orders        Start     Ordered   06/02/17 1427  Full code  Continuous     06/02/17 1426    Code Status History    Date Active Date Inactive Code Status Order ID Comments User Context   This patient has a current code status but no historical code status.    Advance Directive Documentation     Most Recent Value  Type of Advance Directive  Healthcare Power of Attorney, Living will  Pre-existing out of facility DNR order (yellow form or pink MOST form)  -  "MOST" Form in Place?  -      TOTAL TIME TAKING CARE OF THIS PATIENT: 40 minutes.    Perl Folmar M.D on 06/03/2017 at 1:35 PM  Between 7am to 6pm - Pager - [phone] After 6pm go to [email or web address] - password [email or web address] [facility]  Office  [phone]  CC: Primary care physician; Tower, [name], MD

## 2017-06-03 NOTE — Progress Notes (Signed)
    Patient is for [facility] this morning. She continues to note atypical chest pain that is present with deep inspiration. Results to follow.

## 2017-06-03 NOTE — Plan of Care (Signed)
Problem: Pain Managment: Goal: General experience of comfort will improve Outcome: Progressing Pt complained of some "prickly type chest pain" but pt states did not want anything for pain, because she feels as if its more muscle pain, because its when she moves more so. Pt also complained of a migraine, got order to give pt's home med, maxalt. Which gave relief. Will continue to monitor.  Problem: Tissue Perfusion: Goal: Risk factors for ineffective tissue perfusion will decrease Outcome: Progressing Heparin gtt  Problem: Activity: Goal: Risk for activity intolerance will decrease Outcome: Completed/Met Date Met: 06/03/17 Up ad lib independently, tolerating well.

## 2017-06-03 NOTE — Progress Notes (Signed)
Pt complaining of migraine. Pt states she takes Rizatriptan at home for this, I explained to pt that we do not carry this medication here and she then stated that she has her pills from home that are still in the wrapper. MD was paged to get the okay to give her home med. Dr. Marcille Blanco okay with pt taking her home med, med was sent to pharmacy to put a label on it so this RN can redistribute it to pt.

## 2017-06-06 ENCOUNTER — Encounter: Payer: Self-pay | Admitting: Emergency Medicine

## 2017-06-06 DIAGNOSIS — G43009 Migraine without aura, not intractable, without status migrainosus: Secondary | ICD-10-CM | POA: Insufficient documentation

## 2017-06-06 DIAGNOSIS — Z79899 Other long term (current) drug therapy: Secondary | ICD-10-CM | POA: Diagnosis not present

## 2017-06-06 DIAGNOSIS — E039 Hypothyroidism, unspecified: Secondary | ICD-10-CM | POA: Diagnosis not present

## 2017-06-06 DIAGNOSIS — Z7982 Long term (current) use of aspirin: Secondary | ICD-10-CM | POA: Diagnosis not present

## 2017-06-06 DIAGNOSIS — R51 Headache: Secondary | ICD-10-CM | POA: Diagnosis not present

## 2017-06-06 NOTE — ED Triage Notes (Signed)
Pt reports that she was here for chest pain this past week and the stress of all of the testing began a migraine. Pt states that she is having on and off migraines and that she wants to be checked out before it becomes bad again. Pt is ambulatory to triage with NAD noted at this time.

## 2017-06-07 ENCOUNTER — Other Ambulatory Visit: Payer: Self-pay

## 2017-06-07 ENCOUNTER — Emergency Department
Admission: EM | Admit: 2017-06-07 | Discharge: 2017-06-07 | Disposition: A | Discharge status: Home / Self Care | Payer: BLUE CROSS/BLUE SHIELD | Attending: Emergency Medicine | Admitting: Emergency Medicine

## 2017-06-07 DIAGNOSIS — G43019 Migraine without aura, intractable, without status migrainosus: Secondary | ICD-10-CM | POA: Diagnosis not present

## 2017-06-07 DIAGNOSIS — G518 Other disorders of facial nerve: Secondary | ICD-10-CM | POA: Diagnosis not present

## 2017-06-07 DIAGNOSIS — M791 Myalgia: Secondary | ICD-10-CM | POA: Diagnosis not present

## 2017-06-07 DIAGNOSIS — M542 Cervicalgia: Secondary | ICD-10-CM | POA: Diagnosis not present

## 2017-06-07 DIAGNOSIS — G43009 Migraine without aura, not intractable, without status migrainosus: Secondary | ICD-10-CM

## 2017-06-07 MED ORDER — KETOROLAC TROMETHAMINE 10 MG PO TABS: 10.0000 mg | ORAL_TABLET | Freq: Four times a day (QID) | ORAL | 0 refills | Status: DC | PRN

## 2017-06-07 MED ORDER — KETOROLAC TROMETHAMINE 10 MG PO TABS
10.0000 mg | ORAL_TABLET | Freq: Once | ORAL | Status: AC
  Administered 2017-06-07: 10 mg via ORAL
  Filled 2017-06-07: qty 1

## 2017-06-07 MED ORDER — ALPRAZOLAM 0.5 MG PO TABS: ORAL_TABLET | ORAL | 0 refills | Status: DC

## 2017-06-07 NOTE — ED Provider Notes (Signed)
[facility] Emergency Department Provider Note   First MD Initiated Contact with Patient 06/07/17 [phone]     (approximate)  I have reviewed the triage vital signs and the nursing notes.   HISTORY  Chief Complaint Migraine    HPI Jennifer [name] is a 61 y.o. female with below list of chronic medical conditions presents to the emergency department with "migraine headache". Patient states that her headache is completely resolved at this time following taking Rizatriptan, Relafen before arrival to the emergency department. Patient denied any weakness no numbness no gait instability. Patient denies any visual changes no nausea or vomiting. Patient states that she is noting increasing headache activity over the past week which she attributes secondary to stress and evaluated for chest pain. Patient denies any chest pain at present no shortness of breath. States that she is followed at the headaches and in [place] where she receives steroid injections from Dr. Cecilie Lowers. Patient's current pain score is 0 out of 10.   Past Medical History:  Diagnosis Date  . Allergic rhinitis   . Cervical dysplasia   . Endometriosis   . Esophageal reflux   . Hypothyroid   . Migraine with aura, without mention of intractable migraine without mention of status migrainosus   . Osteopenia 11/2016   T score -2.2 FRAX 9.9%/1.6%    Patient Active Problem List   Diagnosis Date Noted  . Elevated troponin 06/02/2017  . Chest pain 06/02/2017  . Ear pain, bilateral 05/24/2017  . Viral URI with cough 01/25/2017  . Screening for diabetes mellitus 07/28/2016  . Allergic rhinitis 11/13/2014  . Adenopathy, cervical 10/30/2014  . Screening for lipoid disorders 09/26/2014  . Preventative health care 09/26/2014  . Routine general medical examination at a health care facility 10/28/2012  . Herpes labialis 04/28/2011  . GERD 09/16/2010  . Hypothyroidism 05/07/2008  . Osteopenia 05/07/2008  .  Migraine with aura 11/11/2007    Past Surgical History:  Procedure Laterality Date  . BREAST SURGERY     Benign breast lump excised  . CERVICAL CONE BIOPSY  1985   severe dysplasia  . COLONOSCOPY  2005   normal  . COLPOSCOPY    . ROTATOR CUFF REPAIR  08/2008    Prior to Admission medications   Medication Sig Start Date End Date Taking? Authorizing Provider  ALPRAZolam Duanne Moron) 0.5 MG tablet TAKE 1/2 TO 1 TABLET BY MOUTH AS NEED FOR SLEEP OR ANXIETY 03/29/17   Tower, [name], MD  aspirin 81 MG chewable tablet Chew 1 tablet (81 mg total) by mouth daily. 06/04/17   Fritzi Mandes, MD  atenolol (TENORMIN) 25 MG tablet Take 25 mg by mouth daily.     [provider]  baclofen (LIORESAL) 10 MG tablet Take 10 mg by mouth as needed. For migraine prevention     [provider]  Calcium Carbonate-Vitamin D 600-400 MG-UNIT tablet Take 2 tablets by mouth daily. Totaling 1200 mg    [provider]  Cholecalciferol (VITAMIN D3) 400 units tablet Take by mouth daily. 950 mg    [provider]  ketorolac (TORADOL) 10 MG tablet Take 10 mg by mouth as needed. With food for Migraine    [provider]  levothyroxine (SYNTHROID, LEVOTHROID) 75 MCG tablet Take 1 tablet (75 mcg total) by mouth daily. 08/03/16   Tower, [name], MD  loratadine (CLARITIN) 10 MG tablet Take 10 mg by mouth daily.    [provider]  nabumetone (RELAFEN) 500 MG  tablet Take 500 mg by mouth daily as needed.     [provider]  norethindrone-ethinyl estradiol (JINTELI) 1-5 MG-MCG TABS tablet Take 1 tablet by mouth daily. 11/10/16   Fontaine, Belinda Block, MD  Omega-3 Fatty Acids (FISH OIL) 1000 MG CAPS Take by mouth.    [provider]  pantoprazole (PROTONIX) 40 MG tablet 40 mg daily.  02/09/17   [provider]  predniSONE (DELTASONE) 20 MG tablet TAPER AS DIRECTED OVER 4 DAYS, TAKING 4 TABS-3 TABS-2 TABS-1 TAB. TO BREAK 12/15/16   [provider]    promethazine (PHENERGAN) 25 MG tablet Take 25 mg by mouth as needed. When having a migraine    [provider]  ranitidine (ZANTAC) 150 MG tablet Take 1 tablet (150 mg total) by mouth 2 (two) times daily. 03/15/17 05/15/17  Jonathon Bellows, MD  rizatriptan (MAXALT) 10 MG tablet Take 10 mg by mouth as needed. May repeat in 2 hours if needed    [provider]  valACYclovir (VALTREX) 1000 MG tablet Take 2 tablets (2,000 mg total) by mouth 2 (two) times daily. 05/24/17   Ria Bush, MD    Allergies Codeine phosphate; Flonase [fluticasone propionate]; and Hydrocodone  Family History  Problem Relation Age of Onset  . Breast cancer Mother 65  . Diabetes Mother   . Hypertension Father   . Heart disease Father   . Osteoporosis Maternal Grandmother   . Diabetes Maternal Grandmother   . Hypertension Sister     Social History Social History  Substance Use Topics  . Smoking status: Never Smoker  . Smokeless tobacco: Never Used  . Alcohol use No    Review of Systems Constitutional: No fever/chills Eyes: No visual changes. ENT: No sore throat. Cardiovascular: Denies chest pain. Respiratory: Denies shortness of breath. Gastrointestinal: No abdominal pain.  No nausea, no vomiting.  No diarrhea.  No constipation. Genitourinary: Negative for dysuria. Musculoskeletal: Negative for neck pain.  Negative for back pain. Integumentary: Negative for rash. Neurological: Positive for headaches, negative for focal weakness or numbness.   ____________________________________________   PHYSICAL EXAM:  VITAL SIGNS: ED Triage Vitals  Enc Vitals Group     BP 06/06/17 2130 133/71     Pulse Rate 06/06/17 2130 83     Resp 06/06/17 2130 18     Temp 06/06/17 2130 98.8 F (37.1 C)     Temp Source 06/06/17 2130 Oral     SpO2 06/06/17 2130 98 %     Weight 06/06/17 2131 56.7 kg (125 lb)     Height 06/06/17 2131 1.549 m (5\' 1" )     Head Circumference --      Peak Flow --      Pain  Score 06/06/17 2130 1     Pain Loc --      Pain Edu? --      Excl. in [place]? --     Constitutional: Alert and oriented. Well appearing and in no acute distress. Eyes: Conjunctivae are normal. PERRL. EOMI. Head: Atraumatic. Ears:  Healthy appearing ear canals and TMs bilaterally Nose: No congestion/rhinnorhea. Mouth/Throat: Mucous membranes are moist.  Oropharynx non-erythematous. Neck: No stridor.   Cardiovascular: Normal rate, regular rhythm. Good peripheral circulation. Grossly normal heart sounds. Respiratory: Normal respiratory effort.  No retractions. Lungs CTAB. Gastrointestinal: Soft and nontender. No distention.  Musculoskeletal: No lower extremity tenderness nor edema. No gross deformities of extremities. Neurologic:  Normal speech and language. No gross focal neurologic deficits are appreciated.  Skin:  Skin  is warm, dry and intact. No rash noted. Psychiatric: Mood and affect are normal. Speech and behavior are normal.    Procedures   ____________________________________________   INITIAL IMPRESSION / ASSESSMENT AND PLAN / ED COURSE  Pertinent labs & imaging results that were available during my care of the patient were reviewed by me and considered in my medical decision making (see chart for details).  61-year-old female with known history of migraine headaches [facility] Lady Gary presents with headache consistent with previous migraine headache. Patient pain was completely resolved before I evaluated her. She states that she had a prescription for Toradol home however that has expired in 2016. As such patient was given a prescription for Toradol. She is advised to follow-up with her neurologist today.      ____________________________________________  FINAL CLINICAL IMPRESSION(S) / ED DIAGNOSES  Final diagnoses:  Migraine without aura and without status migrainosus, not intractable     MEDICATIONS GIVEN DURING THIS VISIT:  Medications    ketorolac (TORADOL) tablet 10 mg (not administered)     NEW OUTPATIENT MEDICATIONS STARTED DURING THIS VISIT:  New Prescriptions   No medications on file    Modified Medications   No medications on file    Discontinued Medications   No medications on file     Note:  This document was prepared using Dragon voice recognition software and may include unintentional dictation errors.    Gregor Hams, MD 06/08/17 [phone]

## 2017-06-07 NOTE — ED Notes (Signed)
ED Provider at bedside. 

## 2017-06-07 NOTE — Telephone Encounter (Signed)
Rx called in as prescribed 

## 2017-06-07 NOTE — Telephone Encounter (Signed)
Px written for call in   

## 2017-06-07 NOTE — ED Notes (Signed)
Patient reports hx of frequent migraine.  Pt reports migraine has resolved.

## 2017-06-07 NOTE — ED Notes (Signed)
Reviewed d/c instruction, follow-up care, prescription with patient. Pt verbalized understanding

## 2017-06-07 NOTE — Telephone Encounter (Signed)
Pt left /vm requesting refill alprazolam [facility]. Pt will be leaving town on 06/08/17 and request to be refilled 06/07/17. Last refilled # 15 on 03/29/17. Last annual 08/03/16.

## 2017-06-11 ENCOUNTER — Telehealth: Payer: Self-pay

## 2017-06-11 ENCOUNTER — Ambulatory Visit: Payer: BLUE CROSS/BLUE SHIELD | Admitting: Family Medicine

## 2017-06-11 NOTE — Telephone Encounter (Signed)
Patient left a voice message last pm at 5:30 stating that she will be unable to keep appointment tomorrow with Dr. Glori Bickers due to a family emergency.    I received message this am (7/20) at 9:30am.  Cancelled appointment off schedule and LM for patient on her cell to please call us when she gets back to town to reschedule.  *this was a hospital/TCM follow up visit.

## 2017-07-01 DIAGNOSIS — H1132 Conjunctival hemorrhage, left eye: Secondary | ICD-10-CM | POA: Diagnosis not present

## 2017-07-13 DIAGNOSIS — H11003 Unspecified pterygium of eye, bilateral: Secondary | ICD-10-CM | POA: Diagnosis not present

## 2017-07-15 ENCOUNTER — Ambulatory Visit: Payer: BLUE CROSS/BLUE SHIELD | Admitting: Gastroenterology

## 2017-07-19 ENCOUNTER — Ambulatory Visit: Payer: BLUE CROSS/BLUE SHIELD | Admitting: Gastroenterology

## 2017-07-19 NOTE — Progress Notes (Deleted)
She is here today to follow up for GERD.   Summary of history : She was initially seen on 12/15/16 with a 15 year history of GERD , worse last few months , been on Protonix for many years at 40 mg, prevacid added recently at night but had no improvement of symptoms.  Recent gain in weight likely felt to be the reason for worsening of her symptoms and consuming a night time snack prior to bed time, cessation of the night snack helped with the symptoms .   Interval history  : 03/15/2017  -06/2017    Presently she has gone back to 40 mg protonix. Not on any prevacid. She had recurrence of symptoms at 20 mg when I decreased the dose.   Jennifer [name] a 61 y.o.y/o female here to follow up for worsening of GERD symptoms despite use of PPI.Marland KitchenAfter changing her life style changes has noticed a significant improvement. She did not tolerate a lower dose of PPI. She will try to switch to BID dose H 2 blocker and stop PPI.

## 2017-08-05 DIAGNOSIS — G518 Other disorders of facial nerve: Secondary | ICD-10-CM | POA: Diagnosis not present

## 2017-08-05 DIAGNOSIS — M542 Cervicalgia: Secondary | ICD-10-CM | POA: Diagnosis not present

## 2017-08-05 DIAGNOSIS — M791 Myalgia: Secondary | ICD-10-CM | POA: Diagnosis not present

## 2017-08-05 DIAGNOSIS — G43019 Migraine without aura, intractable, without status migrainosus: Secondary | ICD-10-CM | POA: Diagnosis not present

## 2017-08-12 ENCOUNTER — Other Ambulatory Visit: Payer: Self-pay | Admitting: Family Medicine

## 2017-08-13 NOTE — Telephone Encounter (Signed)
No recent or future appts. But pt did have her TSH level checked when she was in the hospital recently. Please advise

## 2017-08-13 NOTE — Telephone Encounter (Signed)
Will refill electronically  

## 2017-08-17 ENCOUNTER — Other Ambulatory Visit: Payer: Self-pay

## 2017-08-17 MED ORDER — ALPRAZOLAM 0.5 MG PO TABS: ORAL_TABLET | ORAL | 0 refills | Status: DC

## 2017-08-17 NOTE — Telephone Encounter (Signed)
Px written for call in   

## 2017-08-17 NOTE — Telephone Encounter (Signed)
Pt left vm requesting refill alprazolam; last refilled # 15 on 06/07/17. Last annual 08/03/16; no future appt scheduled.[facility].

## 2017-08-17 NOTE — Telephone Encounter (Signed)
Rx called in to requested pharmacy 

## 2017-08-18 DIAGNOSIS — G43019 Migraine without aura, intractable, without status migrainosus: Secondary | ICD-10-CM | POA: Diagnosis not present

## 2017-08-18 DIAGNOSIS — M542 Cervicalgia: Secondary | ICD-10-CM | POA: Diagnosis not present

## 2017-08-18 DIAGNOSIS — M791 Myalgia: Secondary | ICD-10-CM | POA: Diagnosis not present

## 2017-08-18 DIAGNOSIS — G518 Other disorders of facial nerve: Secondary | ICD-10-CM | POA: Diagnosis not present

## 2017-09-16 ENCOUNTER — Telehealth: Payer: Self-pay | Admitting: Family Medicine

## 2017-09-16 DIAGNOSIS — Z1211 Encounter for screening for malignant neoplasm of colon: Secondary | ICD-10-CM | POA: Insufficient documentation

## 2017-09-16 NOTE — Telephone Encounter (Signed)
Referral faxed to [facility] Dept. LMOM that we faxed her referral over to [place] and they would call her to schedule.

## 2017-09-16 NOTE — Telephone Encounter (Signed)
Copied from [place] #[id number]. Topic: Referral - Request >> Sep 15, 2017  2:27 PM Aurelio Brash B wrote: Reason for CRM: Pt needs referral  For colonoscopy  at [facility] clinic [phone]  Ref done  Will route to [facility]

## 2017-09-24 ENCOUNTER — Ambulatory Visit (INDEPENDENT_AMBULATORY_CARE_PROVIDER_SITE_OTHER): Payer: BLUE CROSS/BLUE SHIELD | Admitting: Family Medicine

## 2017-09-24 ENCOUNTER — Encounter: Payer: Self-pay | Admitting: Family Medicine

## 2017-09-24 ENCOUNTER — Ambulatory Visit: Payer: Self-pay | Admitting: *Deleted

## 2017-09-24 VITALS — BP 128/74 | HR 57 | Temp 98.9°F | Ht 61.0 in | Wt 125.0 lb

## 2017-09-24 DIAGNOSIS — R21 Rash and other nonspecific skin eruption: Secondary | ICD-10-CM | POA: Diagnosis not present

## 2017-09-24 MED ORDER — PREDNISONE 5 MG PO TABS: ORAL_TABLET | ORAL | 0 refills | Status: DC

## 2017-09-24 NOTE — Telephone Encounter (Signed)
   Reason for Disposition . Mild widespread rash  Answer Assessment - Initial Assessment Questions 1. APPEARANCE of RASH: "Describe the rash." (e.g., spots, blisters, raised areas, skin peeling, scaly)   Red  Raised   In  Certain   Areas   2. SIZE: "How big are the spots?" (e.g., tip of pen, eraser, coin; inches, centimeters)      Both  Arms   Spots    On   Other   Areas     3. LOCATION: "Where is the rash located?"       Mainly   On  Arms    4. COLOR: "What color is the rash?" (Note: It is difficult to assess rash color in people with darker-colored skin. When this situation occurs, simply ask the caller to describe what they see.)    Red   Bright    5. ONSET: "When did the rash begin?"     sev  6. FEVER: "Do you have a fever?" If so, ask: "What is your temperature, how was it measured, and when did it start?"     no 7. ITCHING: "Does the rash itch?" If so, ask: "How bad is the itch?" (Scale 1-10; or mild, moderate, severe)       No  Itching      But  Feels  sensative  And    Irritated     8. CAUSE: "What do you think is causing the rash?"       No  Idea    9. MEDICATION FACTORS: "Have you started any new medications within the last 2 weeks?" (e.g., antibiotics)      No  New  medication 10. OTHER SYMPTOMS: "Do you have any other symptoms?" (e.g., dizziness, headache, sore throat, joint pain) had   Cold  Recently     11. PREGNANCY: "Is there any chance you are pregnant?" "When was your last menstrual period?"     None  Protocols used: RASH OR REDNESS - [facility]

## 2017-09-24 NOTE — Telephone Encounter (Signed)
Pt  Informed  Of  Appointment today  With dr  Raeford Razor   At  [place]

## 2017-09-24 NOTE — Assessment & Plan Note (Signed)
Possible for a type of urticaria such as dermatographia  - Encouraged to continue the antihistamine - Counseled on emollient use - Provided prednisone in case her symptoms fail to improve - Could consider an allergy referral if no improvement

## 2017-09-24 NOTE — Patient Instructions (Addendum)
Thank you for coming in,   Please continue the Claritin   Please let us know if your symptoms don't improve.    Please feel free to call with any questions or concerns at any time, at [phone]. --Dr. Raeford Razor

## 2017-09-24 NOTE — Progress Notes (Signed)
Jennifer [name] - 61 y.o. female MRN [id number]  Date of birth: 12/26/1955  SUBJECTIVE:  Including CC & ROS.  Chief Complaint  Patient presents with  . Rash    located on both of her arms-starts at her shoulder and extends downward. Noticed the rash yesterday. She has been applying cortisone cream. Denies itching. Describes it as a burn.    Jennifer [name] is a 61 y.o. female that is Presenting with bilateral upper extremity rash. This started yesterday. There is prickly sensation on her arms. There is a macular red rash extending from her upper extremities down to her forearm. This is on the anterior aspect of her arms. Does not extend down to her hands. Does not extend proximally plan the sleeve line. She takes Claritin and this seems to help. She also been rubbing hydrocortisone cream on the arms which seems to have helped. Denies any fevers or chills. Has not had any recent illness. No new medications. No travel.   Review of Systems  Constitutional: Negative for fever.  Musculoskeletal: Negative for gait problem.  Skin: Positive for rash.    HISTORY: Past Medical, Surgical, Social, and Family History Reviewed & Updated per EMR.   Pertinent Historical Findings include:  Past Medical History:  Diagnosis Date  . Allergic rhinitis   . Cervical dysplasia   . Endometriosis   . Esophageal reflux   . Hypothyroid   . Migraine with aura, without mention of intractable migraine without mention of status migrainosus   . Osteopenia 11/2016   T score -2.2 FRAX 9.9%/1.6%    Past Surgical History:  Procedure Laterality Date  . BREAST SURGERY     Benign breast lump excised  . CERVICAL CONE BIOPSY  1985   severe dysplasia  . COLONOSCOPY  2005   normal  . COLPOSCOPY    . ROTATOR CUFF REPAIR  08/2008    Allergies  Allergen Reactions  . Codeine Phosphate Other (See Comments)    Dizziness, heart palpitations, nervous  . Flonase [Fluticasone Propionate] Other (See Comments)    Worsens her migraine    . Hydrocodone     REACTION: made nervous--10/09 surgery rotator cuff  . Sulfa Antibiotics Anxiety    Family History  Problem Relation Age of Onset  . Breast cancer Mother 13  . Diabetes Mother   . Hypertension Father   . Heart disease Father   . Osteoporosis Maternal Grandmother   . Diabetes Maternal Grandmother   . Hypertension Sister      Social History   Social History  . Marital status: Widowed    Spouse name: N/A  . Number of children: N/A  . 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 Smoking status: Never Smoker  . Smokeless tobacco: Never Used  . Alcohol use No  . Drug use: No  . Sexual activity: Not Currently    Birth control/ protection: Post-menopausal     Comment: 1st intercourse 77 yo-5 partners   Other Topics Concern  . 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     PHYSICAL EXAM:  VS: BP 128/74 (BP Location: Left Arm, Patient Position: Sitting, Cuff Size: Normal)   Pulse (!) 57   Temp 98.9 F (37.2 C) (Oral)   Ht 5\' 1"  (1.549 m)   Wt 125 lb (56.7 kg)   SpO2 99%  BMI 23.62 kg/m  Physical Exam Gen: NAD, alert, cooperative with exam, well-appearing ENT: normal lips, normal nasal mucosa,  Eye: normal EOM, normal conjunctiva and lids CV:  no edema, +2 pedal pulses   Resp: no accessory muscle use, non-labored,  Skin: Macular red rash on the arms extending from the sleeve to the middle of the forearm on the lateral aspect, not encircling of the arm, no areas of induration  Neuro: normal tone, normal sensation to touch Psych:  normal insight, alert and oriented MSK: Normal gait, normal strength      ASSESSMENT & PLAN:   Rash Possible for a type of urticaria such as dermatographia  - Encouraged to continue the antihistamine - Counseled on emollient use - Provided prednisone in case her symptoms  fail to improve - Could consider an allergy referral if no improvement

## 2017-10-08 DIAGNOSIS — F4322 Adjustment disorder with anxiety: Secondary | ICD-10-CM | POA: Diagnosis not present

## 2017-10-11 ENCOUNTER — Ambulatory Visit (INDEPENDENT_AMBULATORY_CARE_PROVIDER_SITE_OTHER): Payer: BLUE CROSS/BLUE SHIELD | Admitting: Psychology

## 2017-10-11 DIAGNOSIS — F411 Generalized anxiety disorder: Secondary | ICD-10-CM | POA: Diagnosis not present

## 2017-10-18 ENCOUNTER — Telehealth: Payer: Self-pay | Admitting: Family Medicine

## 2017-10-18 ENCOUNTER — Ambulatory Visit: Payer: Self-pay | Admitting: *Deleted

## 2017-10-18 ENCOUNTER — Other Ambulatory Visit: Payer: Self-pay

## 2017-10-18 ENCOUNTER — Emergency Department
Admission: EM | Admit: 2017-10-18 | Discharge: 2017-10-18 | Disposition: A | Discharge status: Home / Self Care | Payer: BLUE CROSS/BLUE SHIELD | Attending: Emergency Medicine | Admitting: Emergency Medicine

## 2017-10-18 ENCOUNTER — Ambulatory Visit (INDEPENDENT_AMBULATORY_CARE_PROVIDER_SITE_OTHER): Payer: BLUE CROSS/BLUE SHIELD | Admitting: Psychology

## 2017-10-18 DIAGNOSIS — Z79899 Other long term (current) drug therapy: Secondary | ICD-10-CM | POA: Diagnosis not present

## 2017-10-18 DIAGNOSIS — T7840XA Allergy, unspecified, initial encounter: Secondary | ICD-10-CM | POA: Diagnosis not present

## 2017-10-18 DIAGNOSIS — E039 Hypothyroidism, unspecified: Secondary | ICD-10-CM | POA: Insufficient documentation

## 2017-10-18 DIAGNOSIS — F411 Generalized anxiety disorder: Secondary | ICD-10-CM | POA: Diagnosis not present

## 2017-10-18 MED ORDER — FAMOTIDINE 20 MG PO TABS: 20.0000 mg | ORAL_TABLET | Freq: Two times a day (BID) | ORAL | 0 refills | Status: DC

## 2017-10-18 MED ORDER — HYDROXYZINE HCL 25 MG PO TABS: 25.0000 mg | ORAL_TABLET | Freq: Three times a day (TID) | ORAL | 0 refills | Status: DC | PRN

## 2017-10-18 MED ORDER — FAMOTIDINE 20 MG PO TABS
20.0000 mg | ORAL_TABLET | Freq: Once | ORAL | Status: AC
  Administered 2017-10-18: 20 mg via ORAL
  Filled 2017-10-18: qty 1

## 2017-10-18 MED ORDER — METHYLPREDNISOLONE SODIUM SUCC 125 MG IJ SOLR
125.0000 mg | Freq: Once | INTRAMUSCULAR | Status: AC
  Administered 2017-10-18: 125 mg via INTRAMUSCULAR
  Filled 2017-10-18: qty 2

## 2017-10-18 MED ORDER — HYDROXYZINE HCL 50 MG PO TABS
50.0000 mg | ORAL_TABLET | Freq: Once | ORAL | Status: AC
  Administered 2017-10-18: 50 mg via ORAL
  Filled 2017-10-18: qty 1

## 2017-10-18 MED ORDER — PREDNISONE 10 MG (21) PO TBPK: ORAL_TABLET | ORAL | 0 refills | Status: DC

## 2017-10-18 NOTE — ED Notes (Signed)
Pt reports that she has a rash on her entire back and bilat arms - rash started last pm - c/o itching - feels "prickly/scratchy" all over

## 2017-10-18 NOTE — ED Triage Notes (Signed)
Pt states has rash throughout that started yesterday, unsure of cause. States started after eating thanksgiving left overs. States started on back and now on arm, raised and itchy.

## 2017-10-18 NOTE — Telephone Encounter (Signed)
  Reason for Disposition . Mild widespread rash  Answer Assessment - Initial Assessment Questions 1. APPEARANCE of RASH: "Describe the rash." (e.g., spots, blisters, raised areas, skin peeling, scaly)     Pink in color, raised 2. SIZE: "How big are the spots?" (e.g., tip of pen, eraser, coin; inches, centimeters)     Tip of pin 3. LOCATION: "Where is the rash located?"     Generalized: back, bilateral arms,face, neck,around hair line legs feel prickly  4. COLOR: "What color is the rash?" (Note: It is difficult to assess rash color in people with darker-colored skin. When this situation occurs, simply ask the caller to describe what they see.)     Pink in color 5. ONSET: "When did the rash begin?"     Last night 6. FEVER: "Do you have a fever?" If so, ask: "What is your temperature, how was it measured, and when did it start?"    No 7. ITCHING: "Does the rash itch?" If so, ask: "How bad is the itch?" (Scale 1-10; or mild, moderate, severe)     Moderate itchin 8. CAUSE: "What do you think is causing the rash?"     unsure 9. MEDICATION FACTORS: "Have you started any new medications within the last 2 weeks?" (e.g., antibiotics)      No new medications started 10. OTHER SYMPTOMS: "Do you have any other symptoms?" (e.g., dizziness, headache, sore throat, joint pain)       No  Protocols used: RASH OR REDNESS - [facility]   Pt states she had a similar rash on Nov. 2 that covered her arms and was seen by a [facility] provider at the [facility] office and felt as if nothing was done.Pt states she was told to try Hydrocortisone cream which did not help.Pt encouraged  to go to Urgent Care so that she could be seen sooner,but pt states that is not something that she wants to do. Appt made for 11/27 to be seen at [facility] location. Pt states that current rash resembles sunburn but knows that it is not the cause of the rash.

## 2017-10-18 NOTE — ED Provider Notes (Signed)
[facility] Emergency Department Provider Note  ____________________________________________  Time seen: Approximately 8:01 PM  I have reviewed the triage vital signs and the nursing notes.   HISTORY  Chief Complaint Rash   HPI Jennifer [name] is a 61 y.o. female who presents to the emergency department for evaluation periodic rash that covers her trunk, arms, and legs.  She states that she has had a similar reaction earlier in the month, but was unable to identify the offending source.  She denies change in hygiene products or laundry detergents.  She has not eaten any new foods that she is aware of.  She does state that she tried on some clothing that had been hanging in her closet since last winter as well as shopping in a few stores prior to the onset of symptoms.  She has not taken any medications for her symptoms. Past Medical History:  Diagnosis Date  . Allergic rhinitis   . Cervical dysplasia   . Endometriosis   . Esophageal reflux   . Hypothyroid   . Migraine with aura, without mention of intractable migraine without mention of status migrainosus   . Osteopenia 11/2016   T score -2.2 FRAX 9.9%/1.6%    Patient Active Problem List   Diagnosis Date Noted  . Rash 09/24/2017  . Colon cancer screening 09/16/2017  . Elevated troponin 06/02/2017  . Chest pain 06/02/2017  . Ear pain, bilateral 05/24/2017  . Viral URI with cough 01/25/2017  . Screening for diabetes mellitus 07/28/2016  . Allergic rhinitis 11/13/2014  . Adenopathy, cervical 10/30/2014  . Screening for lipoid disorders 09/26/2014  . Preventative health care 09/26/2014  . Routine general medical examination at a health care facility 10/28/2012  . Herpes labialis 04/28/2011  . GERD 09/16/2010  . Hypothyroidism 05/07/2008  . Osteopenia 05/07/2008  . Migraine with aura 11/11/2007    Past Surgical History:  Procedure Laterality Date  . BREAST SURGERY     Benign breast lump excised  .  CERVICAL CONE BIOPSY  1985   severe dysplasia  . COLONOSCOPY  2005   normal  . COLPOSCOPY    . ROTATOR CUFF REPAIR  08/2008    Prior to Admission medications   Medication Sig Start Date End Date Taking? Authorizing Provider  ALPRAZolam (XANAX) 0.5 MG tablet TAKE 1/2 TO 1 TABLET BY MOUTH AS NEED FOR SLEEP OR ANXIETY 08/17/17   Tower, [name], MD  atenolol (TENORMIN) 25 MG tablet Take 25 mg by mouth daily.     [provider]  baclofen (LIORESAL) 10 MG tablet Take 10 mg by mouth as needed. For migraine prevention     [provider]  Calcium Carbonate-Vitamin D 600-400 MG-UNIT tablet Take 2 tablets by mouth daily. Totaling 1200 mg    [provider]  famotidine (PEPCID) 20 MG tablet Take 1 tablet (20 mg total) by mouth 2 (two) times daily. 10/18/17 10/18/18  Hulen Mandler, Johnette Abraham B, FNP  hydrOXYzine (ATARAX/VISTARIL) 25 MG tablet Take 1 tablet (25 mg total) by mouth 3 (three) times daily as needed. 10/18/17   Ramere Downs, Johnette Abraham B, FNP  ketorolac (TORADOL) 10 MG tablet Take 10 mg by mouth as needed. With food for Migraine    [provider]  ketorolac (TORADOL) 10 MG tablet Take 1 tablet (10 mg total) by mouth every 6 (six) hours as needed. 06/07/17   Gregor Hams, MD  levothyroxine (SYNTHROID, LEVOTHROID) 75 MCG tablet TAKE 1 TABLET (75 MCG TOTAL) BY MOUTH DAILY. 08/13/17  Tower, [name], MD  loratadine (CLARITIN) 10 MG tablet Take 10 mg by mouth daily.    [provider]  nabumetone (RELAFEN) 500 MG tablet Take 500 mg by mouth daily as needed.     [provider]  norethindrone-ethinyl estradiol (JINTELI) 1-5 MG-MCG TABS tablet Take 1 tablet by mouth daily. 11/10/16   Fontaine, Belinda Block, MD  Omega-3 Fatty Acids (FISH OIL) 1000 MG CAPS Take by mouth.    [provider]  pantoprazole (PROTONIX) 40 MG tablet TAKE 1 TABLET (40 MG TOTAL) BY MOUTH DAILY. 08/13/17   Tower, [name], MD  predniSONE (STERAPRED UNI-PAK 21 TAB) 10 MG (21) TBPK tablet  Take 6 tablets on day 1 Take 5 tablets on day 2 Take 4 tablets on day 3 Take 3 tablets on day 4 Take 2 tablets on day 5 Take 1 tablet on day 6 10/18/17   Danee Soller B, FNP  promethazine (PHENERGAN) 25 MG tablet Take 25 mg by mouth as needed. When having a migraine    [provider]  rizatriptan (MAXALT-MLT) 10 MG disintegrating tablet  07/14/17   [provider]    Allergies Codeine phosphate; Flonase [fluticasone propionate]; Hydrocodone; Benadryl [diphenhydramine hcl]; and Sulfa antibiotics  Family History  Problem Relation Age of Onset  . Breast cancer Mother 3  . Diabetes Mother   . Hypertension Father   . Heart disease Father   . Osteoporosis Maternal Grandmother   . Diabetes Maternal Grandmother   . Hypertension Sister     Social History Social History   Tobacco Use  . Smoking status: Never Smoker  . Smokeless tobacco: Never Used  Substance Use Topics  . Alcohol use: No    Alcohol/week: 0.0 oz  . Drug use: No    Review of Systems  Constitutional: Negative for fever. Respiratory: Negative for cough or shortness of breath.  Musculoskeletal: Negative for myalgias Skin: Positive for erythematous and pruritic rash Neurological: Negative for numbness or paresthesias. ____________________________________________   PHYSICAL EXAM:  VITAL SIGNS: ED Triage Vitals  Enc Vitals Group     BP 10/18/17 1922 137/69     Pulse Rate 10/18/17 1922 63     Resp 10/18/17 1922 20     Temp 10/18/17 1922 99.1 F (37.3 C)     Temp Source 10/18/17 1922 Oral     SpO2 10/18/17 1922 100 %     Weight 10/18/17 1922 125 lb (56.7 kg)     Height 10/18/17 1922 5\' 1"  (1.549 m)     Head Circumference --      Peak Flow --      Pain Score 10/18/17 1947 0     Pain Loc --      Pain Edu? --      Excl. in [place]? --      Constitutional: Uncomfortable appearing. Eyes: Conjunctivae are clear without discharge or drainage. Nose: No rhinorrhea noted. Mouth/Throat: Airway  is patent.  No edema of the tongue.  No edema of the lips.  Neck: No stridor. Unrestricted range of motion observed. Lymphatic: No palpable lymphadenopathy of the anterior cervical area Cardiovascular: Capillary refill is <3 seconds.  Respiratory: Respirations are even and unlabored.. Musculoskeletal: Unrestricted range of motion observed. Neurologic: Awake, alert, and oriented x 4.  Skin: Diffuse, erythematous, maculopapular rash is present over the back and left lateral thorax as well as bilateral upper extremities.  ____________________________________________   LABS (all labs ordered are listed, but only abnormal results are displayed)  Labs  Reviewed - No data to display ____________________________________________  EKG  Not indicated ____________________________________________  RADIOLOGY  Not indicated ____________________________________________   PROCEDURES  Procedures ____________________________________________   INITIAL IMPRESSION / ASSESSMENT AND PLAN / ED COURSE  Jennifer [name] is a 61 y.o. female who presents to the emergency department for treatment and evaluation after sudden onset of allergic reaction to an unknown substance.  Medications given in the emergency department provided significant and near immediate relief.  She will be given prescriptions for prednisone taper, hydroxyzine, and Pepcid to be taken over the next week.  She was strongly encouraged to schedule follow-up appointment with her primary care provider and request allergy testing.  She was instructed to return to the emergency department for symptoms of change or worsen if she is unable to schedule an appointment.  Medications  methylPREDNISolone sodium succinate (SOLU-MEDROL) 125 mg/2 mL injection 125 mg (125 mg Intramuscular Given 10/18/17 2010)  hydrOXYzine (ATARAX/VISTARIL) tablet 50 mg (50 mg Oral Given 10/18/17 2010)  famotidine (PEPCID) tablet 20 mg (20 mg Oral Given 10/18/17 2010)      Pertinent labs & imaging results that were available during my care of the patient were reviewed by me and considered in my medical decision making (see chart for details). ____________________________________________   FINAL CLINICAL IMPRESSION(S) / ED DIAGNOSES  Final diagnoses:  Allergic reaction, initial encounter    ED Discharge Orders        Ordered    famotidine (PEPCID) 20 MG tablet  2 times daily     10/18/17 2108    hydrOXYzine (ATARAX/VISTARIL) 25 MG tablet  3 times daily PRN     10/18/17 2108    predniSONE (STERAPRED UNI-PAK 21 TAB) 10 MG (21) TBPK tablet     10/18/17 2108       Note:  This document was prepared using Dragon voice recognition software and may include unintentional dictation errors.    Victorino Dike, FNP 10/18/17 2112    Eula Listen, MD 10/18/17 [phone]

## 2017-10-18 NOTE — Telephone Encounter (Signed)
Copied from [place] [id number]. Topic: Quick Communication - See Telephone Encounter >> Oct 18, 2017 12:36 PM Hewitt Shorts wrote: CRM for notification. See Telephone encounter for:  Pt states that the rash has returned and has sprad and needs to know what to do next and understands that Dr. Glori Bickers is out of office til after the 28th  11/26/1

## 2017-10-19 ENCOUNTER — Ambulatory Visit: Payer: BLUE CROSS/BLUE SHIELD | Admitting: Family Medicine

## 2017-10-19 NOTE — Telephone Encounter (Signed)
Patient evaluated in the ED yesterday and rash has resolved.   Rosemarie Ax, MD [facility] 10/19/2017, 12:29 PM

## 2017-10-22 ENCOUNTER — Ambulatory Visit (INDEPENDENT_AMBULATORY_CARE_PROVIDER_SITE_OTHER): Payer: BLUE CROSS/BLUE SHIELD | Admitting: Family Medicine

## 2017-10-22 VITALS — BP 112/74 | HR 56 | Temp 98.3°F | Wt 127.0 lb

## 2017-10-22 DIAGNOSIS — T7840XA Allergy, unspecified, initial encounter: Secondary | ICD-10-CM

## 2017-10-22 DIAGNOSIS — R21 Rash and other nonspecific skin eruption: Secondary | ICD-10-CM | POA: Diagnosis not present

## 2017-10-22 NOTE — Patient Instructions (Signed)
Please take a daily antihistamine, can try children's zyrtec   I have put in a referral to allergy, please stop and see Rosaria Ferries

## 2017-10-22 NOTE — Progress Notes (Signed)
   Subjective:    Patient ID: Jennifer [name], female    DOB: Apr 10, 1956, 61 y.o.   MRN: [id number]  HPI This is a 61 yo female who presents today for follow up of rash. She had the first episode after showering on 09/24/17, she was very red on her arms without much raised surface area. Was seen at Saturday clinic and told she could use otc hydrocortisone. She had another episode 10/17/17, with raised area on arms and back to work.  Last and went to ED and was given solumedrol, prednisone, pepcid, hydroxyzine with quick resolution of symptoms. She requests referral for allergy testing. She has not changed any soaps, lotions, shampoos, detergents or travel.  She reports skin sensitivities and allergies as a child. 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   Past Medical History:  Diagnosis Date  . Allergic rhinitis   . Cervical dysplasia   . Endometriosis   . Esophageal reflux   . Hypothyroid   . Migraine with aura, without mention of intractable migraine without mention of status migrainosus   . Osteopenia 11/2016   T score -2.2 FRAX 9.9%/1.6%   Past Surgical History:  Procedure Laterality Date  . BREAST SURGERY     Benign breast lump excised  . CERVICAL CONE BIOPSY  1985   severe dysplasia  . COLONOSCOPY  2005   normal  . COLPOSCOPY    . ROTATOR CUFF REPAIR  08/2008   Family History  Problem Relation Age of Onset  . Breast cancer Mother 41  . Diabetes Mother   . Hypertension Father   . Heart disease Father   . Osteoporosis Maternal Grandmother   . Diabetes Maternal Grandmother   . Hypertension Sister    Social History   Tobacco Use  . Smoking status: Never Smoker  . Smokeless tobacco: Never Used  Substance Use Topics  . Alcohol use: No    Alcohol/week: 0.0 oz  . Drug use: No      Review of Systems Per HPI    Objective:   Physical Exam  Constitutional: She is oriented to person, place, and time. She appears well-developed and well-nourished. No distress.  HENT:  Head: Normocephalic and  atraumatic.  Eyes: Conjunctivae are normal.  Cardiovascular: Normal rate.  Pulmonary/Chest: Effort normal.  Neurological: She is alert and oriented to person, place, and time.  Skin: Skin is warm and dry. She is not diaphoretic.  Possible trace lacy erythema on arms.  No raised areas.  Psychiatric: She has a normal mood and affect. Her behavior is normal. Judgment and thought content normal.  Vitals reviewed.     BP 112/74 (BP Location: Left Arm, Patient Position: Sitting, Cuff Size: Normal)   Pulse (!) 56   Temp 98.3 F (36.8 C) (Oral)   Wt 127 lb (57.6 kg)   SpO2 98%   BMI 24.00 kg/m  Wt Readings from Last 3 Encounters:  10/22/17 127 lb (57.6 kg)  10/18/17 125 lb (56.7 kg)  09/24/17 125 lb (56.7 kg)       Assessment & Plan:  1. Allergic reaction, initial encounter - Ambulatory referral to Allergy -RTC/ED precautions reviewed -Encouraged her to take a daily antihistamine, if sedation is a problem she can take pediatric dose  2. Rash - Ambulatory referral to Allergy   Clarene Reamer, FNP-BC  [facility] [place] [facility]  10/24/2017 1:36 PM

## 2017-10-24 ENCOUNTER — Encounter: Payer: Self-pay | Admitting: Family Medicine

## 2017-10-25 ENCOUNTER — Telehealth: Payer: Self-pay | Admitting: Family Medicine

## 2017-10-25 ENCOUNTER — Ambulatory Visit (INDEPENDENT_AMBULATORY_CARE_PROVIDER_SITE_OTHER): Payer: BLUE CROSS/BLUE SHIELD | Admitting: Psychology

## 2017-10-25 DIAGNOSIS — F411 Generalized anxiety disorder: Secondary | ICD-10-CM

## 2017-10-25 NOTE — Telephone Encounter (Signed)
Called and spoke with patient.  Advised her to stop hydroxyzine since she is going to have skin testing later this week.

## 2017-10-25 NOTE — Telephone Encounter (Signed)
Copied from [place] [id number]. Topic: Quick Communication - See Telephone Encounter >> Oct 25, 2017 12:03 PM Bea Graff, NT wrote: CRM for notification. See Telephone encounter for: Pt saw Tor Netters on Friday and wants to know if she needs continue taking hydroxyzine and famotidine? She is out of the famotidine. Patient is feeling better from her rash. She was also told to stop taking clartin 3 days before her allergist appt should she stop the hydroxyzine as well?  10/25/17.

## 2017-10-25 NOTE — Telephone Encounter (Signed)
Needs to know today

## 2017-10-28 DIAGNOSIS — R21 Rash and other nonspecific skin eruption: Secondary | ICD-10-CM | POA: Diagnosis not present

## 2017-10-28 DIAGNOSIS — L298 Other pruritus: Secondary | ICD-10-CM | POA: Diagnosis not present

## 2017-10-29 ENCOUNTER — Other Ambulatory Visit: Payer: Self-pay | Admitting: Family Medicine

## 2017-10-29 MED ORDER — ALPRAZOLAM 0.5 MG PO TABS: ORAL_TABLET | ORAL | 0 refills | Status: DC

## 2017-10-29 NOTE — Telephone Encounter (Signed)
Pt  Is  Requesting  A  Refill  On  Xanax

## 2017-10-29 NOTE — Telephone Encounter (Signed)
Last Rx 08/17/2017. Last OV 09/2017-acute

## 2017-10-29 NOTE — Telephone Encounter (Signed)
Copied from [place] [id number]. Topic: Quick Communication - Rx Refill/Question >> Oct 29, 2017 11:27 AM Ahmed Prima L wrote: Has the patient contacted their pharmacy? Cant call for the meds, its a controlled substance   Preferred Pharmacy (with phone number or street name): [facility] [address] In [place]   Agent: Please be advised that RX refills may take up to 3 business days. We ask that you follow-up with your pharmacy.  XANEX 0.5mg 

## 2017-10-29 NOTE — Telephone Encounter (Signed)
Px written for call in   

## 2017-10-29 NOTE — Telephone Encounter (Signed)
Rx called in as prescribed 

## 2017-11-01 ENCOUNTER — Ambulatory Visit: Payer: BLUE CROSS/BLUE SHIELD | Admitting: Psychology

## 2017-11-03 ENCOUNTER — Ambulatory Visit: Payer: BLUE CROSS/BLUE SHIELD | Admitting: Psychology

## 2017-11-03 ENCOUNTER — Ambulatory Visit (INDEPENDENT_AMBULATORY_CARE_PROVIDER_SITE_OTHER): Payer: BLUE CROSS/BLUE SHIELD | Admitting: Psychology

## 2017-11-03 DIAGNOSIS — F411 Generalized anxiety disorder: Secondary | ICD-10-CM

## 2017-11-04 ENCOUNTER — Telehealth: Payer: Self-pay | Admitting: Family Medicine

## 2017-11-04 DIAGNOSIS — E039 Hypothyroidism, unspecified: Secondary | ICD-10-CM

## 2017-11-04 DIAGNOSIS — Z0000 Encounter for general adult medical examination without abnormal findings: Secondary | ICD-10-CM

## 2017-11-04 DIAGNOSIS — Z131 Encounter for screening for diabetes mellitus: Secondary | ICD-10-CM

## 2017-11-04 NOTE — Telephone Encounter (Signed)
-----   Message from Ellamae Sia sent at 11/04/2017 11:23 AM EST ----- Regarding: Lab orders for Monday, 12.17.18 Patient is scheduled for CPX labs, please order future labs, Thanks , [name]

## 2017-11-06 DIAGNOSIS — F4322 Adjustment disorder with anxiety: Secondary | ICD-10-CM | POA: Diagnosis not present

## 2017-11-08 ENCOUNTER — Ambulatory Visit (INDEPENDENT_AMBULATORY_CARE_PROVIDER_SITE_OTHER): Payer: BLUE CROSS/BLUE SHIELD | Admitting: Psychology

## 2017-11-08 ENCOUNTER — Other Ambulatory Visit (INDEPENDENT_AMBULATORY_CARE_PROVIDER_SITE_OTHER): Payer: BLUE CROSS/BLUE SHIELD

## 2017-11-08 DIAGNOSIS — Z131 Encounter for screening for diabetes mellitus: Secondary | ICD-10-CM | POA: Diagnosis not present

## 2017-11-08 DIAGNOSIS — E039 Hypothyroidism, unspecified: Secondary | ICD-10-CM | POA: Diagnosis not present

## 2017-11-08 DIAGNOSIS — E559 Vitamin D deficiency, unspecified: Secondary | ICD-10-CM | POA: Diagnosis not present

## 2017-11-08 DIAGNOSIS — Z0000 Encounter for general adult medical examination without abnormal findings: Secondary | ICD-10-CM | POA: Diagnosis not present

## 2017-11-08 DIAGNOSIS — F411 Generalized anxiety disorder: Secondary | ICD-10-CM | POA: Diagnosis not present

## 2017-11-08 LAB — LIPID PANEL
Cholesterol: 153 mg/dL (ref 0–200)
HDL: 54.4 mg/dL
LDL Cholesterol: 76 mg/dL (ref 0–99)
NonHDL: 98.76
Total CHOL/HDL Ratio: 3
Triglycerides: 114 mg/dL (ref 0.0–149.0)
VLDL: 22.8 mg/dL (ref 0.0–40.0)

## 2017-11-08 LAB — COMPREHENSIVE METABOLIC PANEL WITH GFR
ALT: 15 U/L (ref 0–35)
AST: 21 U/L (ref 0–37)
Albumin: 4.1 g/dL (ref 3.5–5.2)
Alkaline Phosphatase: 54 U/L (ref 39–117)
BUN: 15 mg/dL (ref 6–23)
CO2: 30 meq/L (ref 19–32)
Calcium: 9.2 mg/dL (ref 8.4–10.5)
Chloride: 102 meq/L (ref 96–112)
Creatinine, Ser: 0.72 mg/dL (ref 0.40–1.20)
GFR: 87.49 mL/min
Glucose, Bld: 95 mg/dL (ref 70–99)
Potassium: 4.3 meq/L (ref 3.5–5.1)
Sodium: 136 meq/L (ref 135–145)
Total Bilirubin: 0.6 mg/dL (ref 0.2–1.2)
Total Protein: 7 g/dL (ref 6.0–8.3)

## 2017-11-08 LAB — CBC WITH DIFFERENTIAL/PLATELET
BASOS PCT: 1 % (ref 0.0–3.0)
Basophils Absolute: 0.1 10*3/uL (ref 0.0–0.1)
EOS PCT: 2.2 % (ref 0.0–5.0)
Eosinophils Absolute: 0.1 10*3/uL (ref 0.0–0.7)
HEMATOCRIT: 38.7 % (ref 36.0–46.0)
HEMOGLOBIN: 13 g/dL (ref 12.0–15.0)
LYMPHS PCT: 27.5 % (ref 12.0–46.0)
Lymphs Abs: 1.6 10*3/uL (ref 0.7–4.0)
MCHC: 33.6 g/dL (ref 30.0–36.0)
MCV: 89.7 fl (ref 78.0–100.0)
MONOS PCT: 10.4 % (ref 3.0–12.0)
Monocytes Absolute: 0.6 10*3/uL (ref 0.1–1.0)
NEUTROS ABS: 3.4 10*3/uL (ref 1.4–7.7)
Neutrophils Relative %: 58.9 % (ref 43.0–77.0)
Platelets: 270 10*3/uL (ref 150.0–400.0)
RBC: 4.32 Mil/uL (ref 3.87–5.11)
RDW: 13 % (ref 11.5–15.5)
WBC: 5.8 10*3/uL (ref 4.0–10.5)

## 2017-11-08 LAB — HEMOGLOBIN A1C: Hgb A1c MFr Bld: 6.1 % (ref 4.6–6.5)

## 2017-11-08 LAB — TSH: TSH: 1.04 u[IU]/mL (ref 0.35–4.50)

## 2017-11-08 LAB — VITAMIN D 25 HYDROXY (VIT D DEFICIENCY, FRACTURES): VITD: 29.5 ng/mL — ABNORMAL LOW (ref 30.00–100.00)

## 2017-11-08 NOTE — Addendum Note (Signed)
Addended by: Ellamae Sia on: 11/08/2017 10:33 AM   Modules accepted: Orders

## 2017-11-08 NOTE — Addendum Note (Signed)
Addended by: Ellamae Sia on: 11/08/2017 01:29 PM   Modules accepted: Orders

## 2017-11-09 DIAGNOSIS — F4322 Adjustment disorder with anxiety: Secondary | ICD-10-CM | POA: Diagnosis not present

## 2017-11-09 DIAGNOSIS — G43019 Migraine without aura, intractable, without status migrainosus: Secondary | ICD-10-CM | POA: Diagnosis not present

## 2017-11-10 ENCOUNTER — Encounter: Payer: Self-pay | Admitting: Family Medicine

## 2017-11-10 ENCOUNTER — Ambulatory Visit (INDEPENDENT_AMBULATORY_CARE_PROVIDER_SITE_OTHER): Payer: BLUE CROSS/BLUE SHIELD | Admitting: Family Medicine

## 2017-11-10 VITALS — BP 116/68 | HR 67 | Temp 98.2°F | Ht 60.25 in | Wt 124.5 lb

## 2017-11-10 DIAGNOSIS — R7303 Prediabetes: Secondary | ICD-10-CM | POA: Diagnosis not present

## 2017-11-10 DIAGNOSIS — R21 Rash and other nonspecific skin eruption: Secondary | ICD-10-CM | POA: Diagnosis not present

## 2017-11-10 DIAGNOSIS — M85859 Other specified disorders of bone density and structure, unspecified thigh: Secondary | ICD-10-CM | POA: Diagnosis not present

## 2017-11-10 DIAGNOSIS — Z0000 Encounter for general adult medical examination without abnormal findings: Secondary | ICD-10-CM | POA: Diagnosis not present

## 2017-11-10 DIAGNOSIS — E039 Hypothyroidism, unspecified: Secondary | ICD-10-CM

## 2017-11-10 DIAGNOSIS — Z1211 Encounter for screening for malignant neoplasm of colon: Secondary | ICD-10-CM | POA: Diagnosis not present

## 2017-11-10 MED ORDER — PANTOPRAZOLE SODIUM 40 MG PO TBEC: DELAYED_RELEASE_TABLET | ORAL | 11 refills | Status: DC

## 2017-11-10 MED ORDER — LEVOTHYROXINE SODIUM 75 MCG PO TABS: 75.0000 ug | ORAL_TABLET | Freq: Every day | ORAL | 11 refills | Status: DC

## 2017-11-10 NOTE — Progress Notes (Signed)
Subjective:    Patient ID: Jennifer [name], female    DOB: 1956-06-25, 61 y.o.   MRN: [id number]  HPI Here for health maintenance exam and to review chronic medical problems    Doing ok overall  Has battled an intermittent rash  Saw an allergist and has mild grass allergy -has f/u upcoming  ? 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  Went back to grief counseling (lost husband) - that is helping   Wt Readings from Last 3 Encounters:  11/10/17 124 lb 8 oz (56.5 kg)  10/22/17 127 lb (57.6 kg)  10/18/17 125 lb (56.7 kg)  wt is down 3 lb  She wants to be slimmer  Has not had time to exercise / starts and stops (has good equip at home) 24.11 kg/m   Colonoscopy 2/05 Is interested in seeing GI for this and GERD  She is set up with [facility] - early Jan   Flu shot 10/18  Pap 12/16 Sees gyn yearly - tomorrow  Does not do a pap every single year   Mammogram 12/17 neg -will get her mammogram also  Self breast exam -no lumps  fam hx of breast cancer   Tetanus shot 12/13  HIV screen neg 7/18  Zoster status -interested in shingrix/ plans to get when available   dexa 1/18 osteopenia  Lowest T score -2.2 Dr Phineas Real  D level is 29.5-she is not compliant with taking daily  Takes 1000 plus ca plus D   Hypothyroidism  Pt has no clinical changes No change in energy level/ hair or skin/ edema and no tremor Lab Results  Component Value Date   TSH 1.04 11/08/2017     Diabetes screen Lab Results  Component Value Date   HGBA1C 6.1 11/08/2017  up from 5.8  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) Not enough exercise  Chronically stressed  DM in family   Lab Results  Component Value Date   WBC 5.8 11/08/2017   HGB 13.0 11/08/2017   HCT 38.7 11/08/2017   MCV 89.7 11/08/2017   PLT 270.0 11/08/2017   Lab Results  Component Value Date   CREATININE 0.72 11/08/2017   BUN 15 11/08/2017   NA 136 11/08/2017   K 4.3 11/08/2017   CL 102 11/08/2017   CO2 30 11/08/2017    Lab Results  Component Value Date   ALT 15 11/08/2017   AST 21 11/08/2017   ALKPHOS 54 11/08/2017   BILITOT 0.6 11/08/2017   Cholesterol Lab Results  Component Value Date   CHOL 153 11/08/2017   CHOL 166 06/02/2017   CHOL 178 07/29/2016   Lab Results  Component Value Date   HDL 54.40 11/08/2017   HDL 52 06/02/2017   HDL 59.00 07/29/2016   Lab Results  Component Value Date   LDLCALC 76 11/08/2017   LDLCALC 98 06/02/2017   LDLCALC 102 (H) 07/29/2016   Lab Results  Component Value Date   TRIG 114.0 11/08/2017   TRIG 79 06/02/2017   TRIG 88.0 07/29/2016   Lab Results  Component Value Date   CHOLHDL 3 11/08/2017   CHOLHDL 3.2 06/02/2017   CHOLHDL 3 07/29/2016   Lab Results  Component Value Date   LDLDIRECT 98.6 01/10/2014   LDLDIRECT 121.8 09/16/2010   Cholesterol is excellent ! Diet is very good    Patient Active Problem List   Diagnosis Date Noted  . Hyperglycemia 11/10/2017  . Rash 09/24/2017  . Colon cancer screening  09/16/2017  . Elevated troponin 06/02/2017  . Chest pain 06/02/2017  . Ear pain, bilateral 05/24/2017  . Screening for diabetes mellitus 07/28/2016  . Allergic rhinitis 11/13/2014  . Adenopathy, cervical 10/30/2014  . Screening for lipoid disorders 09/26/2014  . Preventative health care 09/26/2014  . Routine general medical examination at a health care facility 10/28/2012  . Herpes labialis 04/28/2011  . GERD 09/16/2010  . Hypothyroidism 05/07/2008  . Osteopenia 05/07/2008  . Migraine with aura 11/11/2007   Past Medical History:  Diagnosis Date  . Allergic rhinitis   . Cervical dysplasia   . Endometriosis   . Esophageal reflux   . Hypothyroid   . Migraine with aura, without mention of intractable migraine without mention of status migrainosus   . Osteopenia 11/2016   T score -2.2 FRAX 9.9%/1.6%   Past Surgical History:  Procedure Laterality Date  . BREAST SURGERY     Benign breast lump excised  . CERVICAL CONE BIOPSY   1985   severe dysplasia  . COLONOSCOPY  2005   normal  . COLPOSCOPY    . ROTATOR CUFF REPAIR  08/2008   Social History   Tobacco Use  . Smoking status: Never Smoker  . Smokeless tobacco: Never Used  Substance Use Topics  . Alcohol use: No    Alcohol/week: 0.0 oz  . Drug use: No   Family History  Problem Relation Age of Onset  . Breast cancer Mother 24  . Diabetes Mother   . Hypertension Father   . Heart disease Father   . Osteoporosis Maternal Grandmother   . Diabetes Maternal Grandmother   . Hypertension Sister    Allergies  Allergen Reactions  . Codeine Phosphate Other (See Comments)    Dizziness, heart palpitations, nervous  . Flonase [Fluticasone Propionate] Other (See Comments)    Worsens her migraine   . Hydrocodone     REACTION: made nervous--10/09 surgery rotator cuff  . Benadryl [Diphenhydramine Hcl] Palpitations  . Sulfa Antibiotics Anxiety   Current Outpatient Medications on File Prior to Visit  Medication Sig Dispense Refill  . ALPRAZolam (XANAX) 0.5 MG tablet TAKE 1/2 TO 1 TABLET BY MOUTH AS NEED FOR SLEEP OR ANXIETY 15 tablet 0  . atenolol (TENORMIN) 25 MG tablet Take 25 mg by mouth daily.     . baclofen (LIORESAL) 10 MG tablet Take 10 mg by mouth as needed. For migraine prevention     . Calcium Carbonate-Vitamin D 600-400 MG-UNIT tablet Take 2 tablets by mouth daily. Totaling 1200 mg    . famotidine (PEPCID) 40 MG tablet Take 40 mg by mouth 2 (two) times daily.  3  . hydrOXYzine (ATARAX/VISTARIL) 25 MG tablet Take 1 tablet (25 mg total) by mouth 3 (three) times daily as needed. 30 tablet 0  . ketorolac (TORADOL) 10 MG tablet Take 1 tablet (10 mg total) by mouth every 6 (six) hours as needed. (Patient not taking: Reported on 11/11/2017) 20 tablet 0  . levocetirizine (XYZAL) 5 MG tablet Take 1 tablet by mouth every evening.  3  . nabumetone (RELAFEN) 500 MG tablet Take 500 mg by mouth daily as needed.     . Omega-3 Fatty Acids (FISH OIL) 1000 MG CAPS Take  by mouth.    . promethazine (PHENERGAN) 25 MG tablet Take 25 mg by mouth as needed. When having a migraine    . rizatriptan (MAXALT-MLT) 10 MG disintegrating tablet   1   No current facility-administered medications on file prior to visit.  Review of Systems  Constitutional: Negative for activity change, appetite change, fatigue, fever and unexpected weight change.  HENT: Negative for congestion, ear pain, rhinorrhea, sinus pressure and sore throat.   Eyes: Negative for pain, redness and visual disturbance.  Respiratory: Negative for cough, shortness of breath and wheezing.   Cardiovascular: Negative for chest pain and palpitations.  Gastrointestinal: Negative for abdominal pain, blood in stool, constipation and diarrhea.  Endocrine: Negative for polydipsia and polyuria.  Genitourinary: Negative for dysuria, frequency and urgency.  Musculoskeletal: Negative for arthralgias, back pain and myalgias.  Skin: Positive for rash. Negative for pallor.       Pos for intermittent rash / urticaria   Allergic/Immunologic: Negative for environmental allergies.  Neurological: Negative for dizziness, syncope and headaches.  Hematological: Negative for adenopathy. Does not bruise/bleed easily.  Psychiatric/Behavioral: Negative for decreased concentration and dysphoric mood. The patient is not nervous/anxious.        Pos or stressors        Objective:   Physical Exam  Constitutional: She appears well-developed and well-nourished. No distress.  Well appearing   HENT:  Head: Normocephalic and atraumatic.  Right Ear: External ear normal.  Left Ear: External ear normal.  Nose: Nose normal.  Mouth/Throat: Oropharynx is clear and moist.  Eyes: Conjunctivae and EOM are normal. Pupils are equal, round, and reactive to light. Right eye exhibits no discharge. Left eye exhibits no discharge. No scleral icterus.  Neck: Normal range of motion. Neck supple. No JVD present. Carotid bruit is not present. No  thyromegaly present.  Cardiovascular: Normal rate, regular rhythm, normal heart sounds and intact distal pulses. Exam reveals no gallop.  Pulmonary/Chest: Effort normal and breath sounds normal. No respiratory distress. She has no wheezes. She has no rales.  Abdominal: Soft. Bowel sounds are normal. She exhibits no distension and no mass. There is no tenderness.  Musculoskeletal: She exhibits no edema or tenderness.  No kyphosis   Lymphadenopathy:    She has no cervical adenopathy.  Neurological: She is alert. She has normal reflexes. No cranial nerve deficit. She exhibits normal muscle tone. Coordination normal.  Skin: Skin is warm and dry. No rash noted. No erythema. No pallor.  Few resolving whelps on L upper arm  Psychiatric: She has a normal mood and affect.  Seems generally fatigued and stressed           Assessment & Plan:   Problem List Items Addressed This Visit      Endocrine   Hypothyroidism    Hypothyroidism  Pt has no clinical changes No change in energy level/ hair or skin/ edema and no tremor Lab Results  Component Value Date   TSH 1.04 11/08/2017          Relevant Medications   levothyroxine (SYNTHROID, LEVOTHROID) 75 MCG tablet     Musculoskeletal and Integument   Osteopenia    Rev dexa (followed by gyn)  Lowest T -2.2 No falsl or fx  Urged to continue D and start exercise       Rash    Sounds like non allergic urticaria ? Trigger -poss stress or temp  Antihistamine prn  Sees allergist         Other   Colon cancer screening    Pt has GI visit planned to discuss colonoscopy      Prediabetes    Lab Results  Component Value Date   HGBA1C 6.1 11/08/2017   Prediabetes now  disc imp of low glycemic diet and wt  loss to prevent DM2  Continue to follow  Strong fam hx and diet is very good       Routine general medical examination at a health care facility - Primary    Reviewed health habits including diet and exercise and skin cancer  prevention Reviewed appropriate screening tests for age  Also reviewed health mt list, fam hx and immunization status , as well as social and family history   See HPI Labs rev  For gyn visit tomorrow Will schedule her own mammogram appt  F/u with gi as planned also  Disc need to reduce stress

## 2017-11-10 NOTE — Patient Instructions (Addendum)
Don't forget to call and schedule your mammogram with the breast center   The Shingrix vaccine is available at pharmacies - call your pharmacy to check on wait list and also cost    Take at least 2000 iu vit D daily (plus your Ca and D ) - every day  Exercise is also important for bone health   See GI as planned  Take care of yourself

## 2017-11-11 ENCOUNTER — Encounter: Payer: Self-pay | Admitting: Gynecology

## 2017-11-11 ENCOUNTER — Ambulatory Visit (INDEPENDENT_AMBULATORY_CARE_PROVIDER_SITE_OTHER): Payer: BLUE CROSS/BLUE SHIELD | Admitting: Gynecology

## 2017-11-11 VITALS — BP 120/76 | Ht 60.25 in | Wt 125.0 lb

## 2017-11-11 DIAGNOSIS — N952 Postmenopausal atrophic vaginitis: Secondary | ICD-10-CM | POA: Diagnosis not present

## 2017-11-11 DIAGNOSIS — M8580 Other specified disorders of bone density and structure, unspecified site: Secondary | ICD-10-CM

## 2017-11-11 DIAGNOSIS — Z01411 Encounter for gynecological examination (general) (routine) with abnormal findings: Secondary | ICD-10-CM

## 2017-11-11 DIAGNOSIS — Z79890 Hormone replacement therapy (postmenopausal): Secondary | ICD-10-CM

## 2017-11-11 MED ORDER — NORETHINDRONE-ETH ESTRADIOL 1-5 MG-MCG PO TABS: 1.0000 | ORAL_TABLET | Freq: Every day | ORAL | 4 refills | Status: DC

## 2017-11-11 NOTE — Patient Instructions (Signed)
Follow-up in 1 year for annual exam, sooner if any issues. 

## 2017-11-11 NOTE — Progress Notes (Signed)
    Jennifer [name] [birth year] [id number]        60 y.o.  G0P0 for annual gynecologic exam.  Doing well without gynecologic complaints  Past medical history,surgical history, problem list, medications, allergies, family history and social history were all reviewed and documented as reviewed in the EPIC chart.  ROS:  Performed with pertinent positives and negatives included in the history, assessment and plan.   Additional significant findings : None   Exam: Caryn Bee assistant Vitals:   11/11/17 1025  BP: 120/76  Weight: 125 lb (56.7 kg)  Height: 5' 0.25" (1.53 m)   Body mass index is 24.21 kg/m.  General appearance:  Normal affect, orientation and appearance. Skin: Grossly normal HEENT: Without gross lesions.  No cervical or supraclavicular adenopathy. Thyroid normal.  Lungs:  Clear without wheezing, rales or rhonchi Cardiac: RR, without RMG Abdominal:  Soft, nontender, without masses, guarding, rebound, organomegaly or hernia Breasts:  Examined lying and sitting without masses, retractions, discharge or axillary adenopathy. Pelvic:  Ext, BUS, Vagina: With atrophic changes  Cervix: With atrophic changes  Uterus: Anteverted, normal size, shape and contour, midline and mobile nontender   Adnexa: Without masses or tenderness    Anus and perineum: Normal   Rectovaginal: Normal sphincter tone without palpated masses or tenderness.    Assessment/Plan:  61 y.o. G0P0 female for annual gynecologic exam.   1. Postmenopausal/atrophic genital changes/HRT.  Continues on FemHRT equivalent.  Doing well without bleeding or any issues as far as hot flushes or sweats.  We again reviewed the risks versus benefits to include thrombosis such as stroke heart attack DVT in the breast cancer issue.  Benefits as far as symptom relief and possible cardiovascular and bone health when started early discussed.  At this point the patient wants to continue.  Refill times 1 year provided.  Call if any  bleeding. 2. Osteopenia.  DEXA 11/2016 T score -2.2.  FRAX 9.9% / 1.6% recognizing she is on HRT.  Reports recent vitamin D low when she is on supplemental vitamin D.  Plan repeat DEXA at 2-year interval. 3. History of herpes labialis.  Uses Valtrex intermittently.  Has supply but may call if needs more. 4. Mammography 10/2016.  Patient is going to call and schedule now.  Breast exam normal today.  SBE monthly reviewed. 5. Pap smear/HPV 2014.  No Pap smear done today.  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  Normal Pap smears since.  Plan repeat Pap smear next year at 5-year interval per current screening guidelines. 6. Colonoscopy 2005.  Patient knows she is overdue and recommendations to schedule. 7. Health maintenance.  No routine lab work done as patient does this elsewhere.  Follow-up 1 year, sooner as needed.   Anastasio Auerbach MD, 10:42 AM 11/11/2017

## 2017-11-11 NOTE — Assessment & Plan Note (Signed)
Hypothyroidism  Pt has no clinical changes No change in energy level/ hair or skin/ edema and no tremor Lab Results  Component Value Date   TSH 1.04 11/08/2017

## 2017-11-11 NOTE — Assessment & Plan Note (Signed)
Rev dexa (followed by gyn)  Lowest T -2.2 No falsl or fx  Urged to continue D and start exercise

## 2017-11-11 NOTE — Assessment & Plan Note (Signed)
Lab Results  Component Value Date   HGBA1C 6.1 11/08/2017   Prediabetes now  disc imp of low glycemic diet and wt loss to prevent DM2  Continue to follow  Strong fam hx and diet is very good

## 2017-11-11 NOTE — Assessment & Plan Note (Signed)
Sounds like non allergic urticaria ? Trigger -poss stress or temp  Antihistamine prn  Sees allergist

## 2017-11-11 NOTE — Assessment & Plan Note (Signed)
Reviewed health habits including diet and exercise and skin cancer prevention Reviewed appropriate screening tests for age  Also reviewed health mt list, fam hx and immunization status , as well as social and family history   See HPI Labs rev  For gyn visit tomorrow Will schedule her own mammogram appt  F/u with gi as planned also  Disc need to reduce stress

## 2017-11-11 NOTE — Assessment & Plan Note (Signed)
Pt has GI visit planned to discuss colonoscopy

## 2017-11-19 DIAGNOSIS — F4322 Adjustment disorder with anxiety: Secondary | ICD-10-CM | POA: Diagnosis not present

## 2017-11-22 ENCOUNTER — Other Ambulatory Visit: Payer: Self-pay | Admitting: Gynecology

## 2017-11-22 ENCOUNTER — Ambulatory Visit: Payer: BLUE CROSS/BLUE SHIELD | Admitting: Psychology

## 2017-11-22 ENCOUNTER — Other Ambulatory Visit: Payer: Self-pay | Admitting: Family Medicine

## 2017-11-22 DIAGNOSIS — Z1231 Encounter for screening mammogram for malignant neoplasm of breast: Secondary | ICD-10-CM

## 2017-11-22 NOTE — Telephone Encounter (Signed)
LOV 11/10/17 with Dr. Glori Bickers / Refill request for Xanax /

## 2017-11-22 NOTE — Telephone Encounter (Signed)
Copied from [place] [phone]. Topic: Quick Communication - See Telephone Encounter >> Nov 22, 2017  3:39 PM Arletha Grippe wrote: CRM for notification. See Telephone encounter for:   11/22/17. Pt would like to have refill on ALPRAZolam (XANAX) 0.5 MG tablet. Please call when ready for pickup.  Pt states this is a pickup rx Cb number is [phone]

## 2017-11-23 DIAGNOSIS — Z923 Personal history of irradiation: Secondary | ICD-10-CM

## 2017-11-23 DIAGNOSIS — C50919 Malignant neoplasm of unspecified site of unspecified female breast: Secondary | ICD-10-CM

## 2017-11-23 HISTORY — DX: Personal history of irradiation: Z92.3

## 2017-11-23 HISTORY — DX: Malignant neoplasm of unspecified site of unspecified female breast: C50.919

## 2017-11-24 MED ORDER — ALPRAZOLAM 0.5 MG PO TABS: ORAL_TABLET | ORAL | 0 refills | Status: DC

## 2017-11-24 NOTE — Telephone Encounter (Signed)
Px written for call in   

## 2017-11-24 NOTE — Telephone Encounter (Signed)
Rx called in as prescribed 

## 2017-11-24 NOTE — Telephone Encounter (Signed)
Last Rx 10/29/17. Last OV 11/10/2017-CPE

## 2017-11-26 DIAGNOSIS — K219 Gastro-esophageal reflux disease without esophagitis: Secondary | ICD-10-CM | POA: Diagnosis not present

## 2017-11-26 DIAGNOSIS — Z1211 Encounter for screening for malignant neoplasm of colon: Secondary | ICD-10-CM | POA: Diagnosis not present

## 2018-01-12 ENCOUNTER — Ambulatory Visit: Payer: Self-pay | Admitting: *Deleted

## 2018-01-12 ENCOUNTER — Other Ambulatory Visit: Payer: Self-pay

## 2018-01-12 ENCOUNTER — Emergency Department
Admission: EM | Admit: 2018-01-12 | Discharge: 2018-01-12 | Disposition: A | Discharge status: Home / Self Care | Payer: BLUE CROSS/BLUE SHIELD | Attending: Emergency Medicine | Admitting: Emergency Medicine

## 2018-01-12 DIAGNOSIS — R202 Paresthesia of skin: Secondary | ICD-10-CM | POA: Diagnosis not present

## 2018-01-12 DIAGNOSIS — E039 Hypothyroidism, unspecified: Secondary | ICD-10-CM | POA: Insufficient documentation

## 2018-01-12 DIAGNOSIS — R52 Pain, unspecified: Secondary | ICD-10-CM | POA: Insufficient documentation

## 2018-01-12 DIAGNOSIS — W010XXA Fall on same level from slipping, tripping and stumbling without subsequent striking against object, initial encounter: Secondary | ICD-10-CM | POA: Insufficient documentation

## 2018-01-12 DIAGNOSIS — Z79899 Other long term (current) drug therapy: Secondary | ICD-10-CM | POA: Insufficient documentation

## 2018-01-12 DIAGNOSIS — W19XXXA Unspecified fall, initial encounter: Secondary | ICD-10-CM

## 2018-01-12 DIAGNOSIS — Y9289 Other specified places as the place of occurrence of the external cause: Secondary | ICD-10-CM | POA: Diagnosis not present

## 2018-01-12 DIAGNOSIS — Y999 Unspecified external cause status: Secondary | ICD-10-CM | POA: Insufficient documentation

## 2018-01-12 NOTE — Telephone Encounter (Signed)
Patient is calling to report she fell at work yesterday. She fell on her R hip and she tried to grab the wall with her R hand going down. She was doing well with a little soreness yesterday- but today she has started having some neck pain with tingling- pins and needles in her arm down to her finger tips. She has contacted her neurologist and they told her to contact her PCP first. Told patient she needs to be evaluated to make sure she has not pulled or damaged nerves. Appointment made for tomorrow. She states she may go to ED- but she will call back and let the office know.  Reason for Disposition . [1] After 3 days (72 hours) AND [2] neck pain not improving  Answer Assessment - Initial Assessment Questions 1. MECHANISM: "How did the injury happen?" (Consider the possibility of domestic violence or elder abuse)     Fall at work- to floor 2. ONSET: "When did the injury happen?" (Minutes or hours ago)     Yesterday- afternoon- 1:00 3. LOCATION: "What part of the neck is injured?"     R side and back of neck 4. SEVERITY: "Can you move the neck normally?"     ROM- normal- no strain or pinching when moving. Feels muscular or nerve related- feels tingling into arm- pushing and lifting is uncomfortable 5. PAIN: "Is there any pain?" If so, ask: "How bad is the pain?"   (Scale 1-10; or mild, moderate, severe)     5 6. CORD SYMPTOMS: "Any weakness or numbness of the arms or legs?"     No- tingling in R arm down to fingers and R side of head/face 7. SIZE: For cuts, bruises, or swelling, ask: "How large is it?" (e.g., inches or centimeters)      None noticed- small scrape on hand 8. TETANUS: For any breaks in the skin, ask: "When was the last tetanus booster?"     Up to date 9. OTHER SYMPTOMS: "Do you have any other symptoms?" (e.g., headache)     no 10. PREGNANCY: "Is there any chance you are pregnant?" "When was your last menstrual period?"       n/a  Protocols used: NECK INJURY-A-AH

## 2018-01-12 NOTE — ED Triage Notes (Signed)
She arrives today ambulatory to triage with reports of a fall yesterday  - she report falling onto her right side and has been experiencing right sided hip, arm, neck and head pain since  Pt reports improvement last pm but today the pain has returned and she reports a "numbness" to her right upper body  Full ROM present

## 2018-01-12 NOTE — Discharge Instructions (Signed)
If you have persistent numbness or tingling or any weakness or any other concerns please follow close with primary care doctor.  Return to the emergency room for any new or worrisome symptoms as discussed.  If you have any numbness or weakness that is worsening or feel other concerns we are happy to take care of you again.  Take over-the-counter pain medications as tolerated for pain as needed although it certainly is the case in a few days for no you likely will feel back to baseline.  If this is not the case that you feel we met something obviously follow closely up with her primary care.

## 2018-01-12 NOTE — ED Provider Notes (Signed)
[facility] Emergency Department Provider Note  ____________________________________________   I have reviewed the triage vital signs and the nursing notes. Where available I have reviewed prior notes and, if possible and indicated, outside hospital notes.    HISTORY  Chief Complaint Fall; Extremity Pain; and Numbness    HPI Jennifer [name] is a 62 y.o. female who states that she had a non-syncopal fall yesterday.  She tripped on something at work.  Landed on her right bottom and jostled up against the wall.  She states that "with my osteopenia I tend to panic when I fall".  She has had no bony tenderness, she actually does not have much pain at all.  She states at one point she had some nonspecific tingling in the area of her right arm that was jostled when she bumped the wall.  That seems to be gone at this time.  She does not have any focal weakness.  She states that she feels that her entire right side of her body hurts a little bit when she moves the wrong way but she is able to move with no difficulty.  She is taken nothing for this discomfort, she did not hit her head she did not pass out.  She has full range of motion of all joints.  She has no difficulty walking or talking.  She denies any other focal or generalized neurologic issues.      Past Medical History:  Diagnosis Date  . Allergic rhinitis   . Cervical dysplasia   . Endometriosis   . Esophageal reflux   . Hypothyroid   . Migraine with aura, without mention of intractable migraine without mention of status migrainosus   . Osteopenia 11/2016   T score -2.2 FRAX 9.9%/1.6%    Patient Active Problem List   Diagnosis Date Noted  . Rash 09/24/2017  . Colon cancer screening 09/16/2017  . Elevated troponin 06/02/2017  . Ear pain, bilateral 05/24/2017  . Prediabetes 07/28/2016  . Allergic rhinitis 11/13/2014  . Adenopathy, cervical 10/30/2014  . Screening for lipoid disorders 09/26/2014  .  Preventative health care 09/26/2014  . Routine general medical examination at a health care facility 10/28/2012  . Herpes labialis 04/28/2011  . GERD 09/16/2010  . Hypothyroidism 05/07/2008  . Osteopenia 05/07/2008  . Migraine with aura 11/11/2007    Past Surgical History:  Procedure Laterality Date  . BREAST SURGERY     Benign breast lump excised  . CERVICAL CONE BIOPSY  1985   severe dysplasia  . COLONOSCOPY  2005   normal  . COLPOSCOPY    . ROTATOR CUFF REPAIR  08/2008    Prior to Admission medications   Medication Sig Start Date End Date Taking? Authorizing Provider  ALPRAZolam (XANAX) 0.5 MG tablet TAKE 1/2 TO 1 TABLET BY MOUTH AS NEED FOR SLEEP OR ANXIETY 11/24/17   Tower, [name], MD  atenolol (TENORMIN) 25 MG tablet Take 25 mg by mouth daily.     [provider]  baclofen (LIORESAL) 10 MG tablet Take 10 mg by mouth as needed. For migraine prevention     [provider]  Calcium Carbonate-Vitamin D 600-400 MG-UNIT tablet Take 2 tablets by mouth daily. Totaling 1200 mg    [provider]  famotidine (PEPCID) 40 MG tablet Take 40 mg by mouth 2 (two) times daily. 10/28/17   [provider]  hydrOXYzine (ATARAX/VISTARIL) 25 MG tablet Take 1 tablet (25 mg total) by mouth 3 (three) times daily as  needed. 10/18/17   Triplett, Johnette Abraham B, FNP  ketorolac (TORADOL) 10 MG tablet Take 1 tablet (10 mg total) by mouth every 6 (six) hours as needed. Patient not taking: Reported on 11/11/2017 06/07/17   Gregor Hams, MD  levocetirizine (XYZAL) 5 MG tablet Take 1 tablet by mouth every evening. 10/28/17   [provider]  levothyroxine (SYNTHROID, LEVOTHROID) 75 MCG tablet Take 1 tablet (75 mcg total) by mouth daily. 11/10/17   Tower, [name], MD  nabumetone (RELAFEN) 500 MG tablet Take 500 mg by mouth daily as needed.     [provider]  norethindrone-ethinyl estradiol (JINTELI) 1-5 MG-MCG TABS tablet Take 1 tablet by mouth daily. 11/11/17    Fontaine, Belinda Block, MD  Omega-3 Fatty Acids (FISH OIL) 1000 MG CAPS Take by mouth.    [provider]  pantoprazole (PROTONIX) 40 MG tablet TAKE 1 TABLET (40 MG TOTAL) BY MOUTH DAILY. 11/10/17   Tower, [name], MD  promethazine (PHENERGAN) 25 MG tablet Take 25 mg by mouth as needed. When having a migraine    [provider]  rizatriptan (MAXALT-MLT) 10 MG disintegrating tablet  07/14/17   [provider]    Allergies Codeine phosphate; Flonase [fluticasone propionate]; Hydrocodone; Benadryl [diphenhydramine hcl]; and Sulfa antibiotics  Family History  Problem Relation Age of Onset  . Breast cancer Mother 3  . Diabetes Mother   . Hypertension Father   . Heart disease Father   . Osteoporosis Maternal Grandmother   . Diabetes Maternal Grandmother   . Hypertension Sister     Social History Social History   Tobacco Use  . Smoking status: Never Smoker  . Smokeless tobacco: Never Used  Substance Use Topics  . Alcohol use: No    Alcohol/week: 0.0 oz  . Drug use: No    Review of Systems Constitutional: No fever/chills Eyes: No visual changes. ENT: No sore throat. No stiff neck no neck pain Cardiovascular: Denies chest pain. Respiratory: Denies shortness of breath. Gastrointestinal:   no vomiting.  No diarrhea.  No constipation. Genitourinary: Negative for dysuria. Musculoskeletal: Negative lower extremity swelling Skin: Negative for rash. Neurological: Negative for severe headaches, focal weakness or numbness.   ____________________________________________   PHYSICAL EXAM:  VITAL SIGNS: ED Triage Vitals  Enc Vitals Group     BP 01/12/18 1611 126/62     Pulse Rate 01/12/18 1611 68     Resp 01/12/18 1611 16     Temp 01/12/18 1611 99 F (37.2 C)     Temp Source 01/12/18 1611 Oral     SpO2 01/12/18 1611 98 %     Weight 01/12/18 1612 125 lb (56.7 kg)     Height 01/12/18 1612 5\' 1"  (1.549 m)     Head Circumference --      Peak Flow --       Pain Score 01/12/18 1612 5     Pain Loc --      Pain Edu? --      Excl. in [place]? --     Constitutional: Alert and oriented. Well appearing and in no acute distress.  Very anxious.  Ambulating around the room cleaning her bag with a bleach wipe when I enter. Eyes: Conjunctivae are normal Head: Atraumatic HEENT: No congestion/rhinnorhea. Mucous membranes are moist.  Oropharynx non-erythematous Neck:   Nontender with no meningismus, no masses, no stridor Cardiovascular: Normal rate, regular rhythm. Grossly normal heart sounds.  Good peripheral circulation. Respiratory: Normal respiratory effort.  No retractions. Lungs CTAB.  Abdominal: Soft and nontender. No distention. No guarding no rebound Back:  There is no focal tenderness or step off.  there is no midline tenderness there are no lesions noted. there is no CVA tenderness Musculoskeletal: No lower extremity tenderness, no upper extremity tenderness. No joint effusions, no DVT signs strong distal pulses no edema, I cannot reproduce any significant discomfort on this patient. Neurologic: Cranial nerves II through XII are grossly intact 5 out of 5 strength bilateral upper and lower extremity. Finger to nose within normal limits heel to shin within normal limits, speech is normal with no word finding difficulty or dysarthria, reflexes symmetric, pupils are equally round and reactive to light, there is no pronator drift, sensation is normal, vision is intact to confrontation, gait is deferred, there is no nystagmus, normal neurologic exam Skin:  Skin is warm, dry and intact. No rash noted. Psychiatric: Mood and affect are just. Speech and behavior are normal.  ____________________________________________   LABS (all labs ordered are listed, but only abnormal results are displayed)  Labs Reviewed - No data to display  Pertinent labs  results that were available during my care of the patient were reviewed by me and considered in my medical decision  making (see chart for details). ____________________________________________  EKG  I personally interpreted any EKGs ordered by me or triage  ____________________________________________  RADIOLOGY  Pertinent labs & imaging results that were available during my care of the patient were reviewed by me and considered in my medical decision making (see chart for details). If possible, patient and/or family made aware of any abnormal findings.  No results found. ____________________________________________    PROCEDURES  Procedure(s) performed: None  Procedures  Critical Care performed: None  ____________________________________________   INITIAL IMPRESSION / ASSESSMENT AND PLAN / ED COURSE  Pertinent labs & imaging results that were available during my care of the patient were reviewed by me and considered in my medical decision making (see chart for details).  Patient here with a great deal of anxiety about the fact that she fell yesterday.  Fortunately, I do not detect any an injury that is of any significance I do not see any evidence of fracture, she is in no acute distress neurologically her NIH stroke scale is 0, there is no evidence of CVA, this was a non-syncopal fall, I do not think she requires workup for that.  Patient states she just gets very anxious after these kind of falls.  She has no evidence of compartment syndrome she has no evidence of occult fracture, I can palpate all areas of her body with no evidence of any comfort.  She has no evidence of spinal fracture, she has no evidence of stroke symptoms.  We will have her follow closely with PCP as an outpatient.  I did advise taking nonsteroidal pain medication as tolerated at home.  Return precautions and follow-up given and understood    ____________________________________________   FINAL CLINICAL IMPRESSION(S) / ED DIAGNOSES  Final diagnoses:  None      This chart was dictated using voice recognition  software.  Despite best efforts to proofread,  errors can occur which can change meaning.      Schuyler Amor, MD 01/12/18 [phone]

## 2018-01-13 ENCOUNTER — Ambulatory Visit: Payer: Self-pay | Admitting: Family Medicine

## 2018-01-13 NOTE — Telephone Encounter (Signed)
Pt went to ED yesterday. No showed at appt this AM.

## 2018-02-02 DIAGNOSIS — M791 Myalgia, unspecified site: Secondary | ICD-10-CM | POA: Diagnosis not present

## 2018-02-02 DIAGNOSIS — M542 Cervicalgia: Secondary | ICD-10-CM | POA: Diagnosis not present

## 2018-02-02 DIAGNOSIS — G43019 Migraine without aura, intractable, without status migrainosus: Secondary | ICD-10-CM | POA: Diagnosis not present

## 2018-02-02 DIAGNOSIS — G518 Other disorders of facial nerve: Secondary | ICD-10-CM | POA: Diagnosis not present

## 2018-02-24 ENCOUNTER — Telehealth: Payer: Self-pay | Admitting: Family Medicine

## 2018-02-24 DIAGNOSIS — R21 Rash and other nonspecific skin eruption: Secondary | ICD-10-CM | POA: Diagnosis not present

## 2018-02-24 DIAGNOSIS — N644 Mastodynia: Secondary | ICD-10-CM

## 2018-02-24 DIAGNOSIS — L298 Other pruritus: Secondary | ICD-10-CM | POA: Diagnosis not present

## 2018-02-24 DIAGNOSIS — J301 Allergic rhinitis due to pollen: Secondary | ICD-10-CM | POA: Diagnosis not present

## 2018-02-24 NOTE — Telephone Encounter (Signed)
Pt had annual exam on 11/10/17.Please advise.

## 2018-02-24 NOTE — Telephone Encounter (Signed)
I put the orders in 

## 2018-02-24 NOTE — Telephone Encounter (Signed)
Copied from [place] [id number]. Topic: Quick Communication - See Telephone Encounter >> Feb 24, 2018 10:46 AM Synthia Innocent wrote: CRM for notification. See Telephone encounter for: 02/24/18. Patient called to schedule routine mammogram, was told to contact us for diagnostic mammogram and [place] order due to having pain in left breast, per [facility]. Please advise

## 2018-03-04 ENCOUNTER — Ambulatory Visit
Admission: RE | Admit: 2018-03-04 | Discharge: 2018-03-04 | Disposition: A | Discharge status: Home / Self Care | Payer: BLUE CROSS/BLUE SHIELD | Source: Ambulatory Visit | Attending: Family Medicine | Admitting: Family Medicine

## 2018-03-04 ENCOUNTER — Other Ambulatory Visit: Payer: Self-pay | Admitting: Family Medicine

## 2018-03-04 DIAGNOSIS — N644 Mastodynia: Secondary | ICD-10-CM

## 2018-03-04 DIAGNOSIS — R921 Mammographic calcification found on diagnostic imaging of breast: Secondary | ICD-10-CM | POA: Diagnosis not present

## 2018-03-04 DIAGNOSIS — N6489 Other specified disorders of breast: Secondary | ICD-10-CM | POA: Diagnosis not present

## 2018-04-08 ENCOUNTER — Telehealth: Payer: Self-pay

## 2018-04-08 DIAGNOSIS — M2550 Pain in unspecified joint: Secondary | ICD-10-CM

## 2018-04-08 DIAGNOSIS — J3089 Other allergic rhinitis: Secondary | ICD-10-CM

## 2018-04-08 DIAGNOSIS — R208 Other disturbances of skin sensation: Secondary | ICD-10-CM

## 2018-04-08 DIAGNOSIS — R202 Paresthesia of skin: Secondary | ICD-10-CM

## 2018-04-08 DIAGNOSIS — R21 Rash and other nonspecific skin eruption: Secondary | ICD-10-CM

## 2018-04-08 NOTE — Telephone Encounter (Signed)
Copied from [place] [id number]. Topic: Referral - Request >> Apr 08, 2018  4:01 PM Conception Chancy, NT wrote: Reason for CRM: patient is calling and wants to be referred to a immunologist. She states she has already seen a allergist. She states everything on the outside of her skin still feels prickly when she puts on clothes. She states it was improving and now it is not. Please advise.

## 2018-04-10 NOTE — Telephone Encounter (Signed)
Is there rash present currently? (or does it come and go)   Any hives ?  Fever/ joint swelling or other symptoms?  Does it worsen with heat?   Thanks - let me know  I rev her allergy notes

## 2018-04-11 ENCOUNTER — Other Ambulatory Visit: Payer: Self-pay

## 2018-04-11 DIAGNOSIS — R202 Paresthesia of skin: Secondary | ICD-10-CM | POA: Insufficient documentation

## 2018-04-11 DIAGNOSIS — R208 Other disturbances of skin sensation: Secondary | ICD-10-CM | POA: Insufficient documentation

## 2018-04-11 DIAGNOSIS — M2550 Pain in unspecified joint: Secondary | ICD-10-CM | POA: Insufficient documentation

## 2018-04-11 NOTE — Telephone Encounter (Signed)
It has been a while since she did any autoimmune labs - (ANA/ rheum factor) - I would like to get blood work next before a referral  Let me know and I will put lab orders in  If there was a rash - I would send to dermatology but does not sound like there is   Does she prefer [facility] or [facility] for new allergy ref?

## 2018-04-11 NOTE — Telephone Encounter (Signed)
Pt notified of Dr. Marliss Coots comments and verbalized understanding. Pt coming in tomorrow for labs please put orders in, she will also talk directly to [facility] while she is here tomorrow about new allergy referral. Pt said it didn't matter she can go to [place]

## 2018-04-11 NOTE — Telephone Encounter (Signed)
No rash or hives at all, never has had a rash, skin is completely, pt said sometime her skin will turn red if she goes outside for to long but that resolves pretty quickly. Pt hasn't had any fever or swelling but she does have arthritis already so she is afraid that this may be some type of autoimmune issues. Pt said her sxs don't worsen with heat they are just worse during day when she has to wear clothes. She said everything just feels "prickly" like there is something under her skin, she can't wear wool at al due to how bad it makes her skin feel. Pt said her sxs are the worse in her back and arms and sometimes in her lips too.  Pt also would like to switch allergist she didn't like how she was treated with Jennifer [name] she felt rush and felt like he got irritated with her due to her asking questions and just told her that whatever she is doing now just keep doing it and really didn't address any of her issues, pt would like a referral to an new allergist because she knows she will have to keep following up with them too.

## 2018-04-11 NOTE — Telephone Encounter (Signed)
Lab orders are in  Also allergy ref done   Aware she will come in tomorrow and talk to pcc Will route as well

## 2018-04-12 ENCOUNTER — Other Ambulatory Visit (INDEPENDENT_AMBULATORY_CARE_PROVIDER_SITE_OTHER): Payer: BLUE CROSS/BLUE SHIELD

## 2018-04-12 DIAGNOSIS — R202 Paresthesia of skin: Secondary | ICD-10-CM | POA: Diagnosis not present

## 2018-04-12 DIAGNOSIS — R21 Rash and other nonspecific skin eruption: Secondary | ICD-10-CM | POA: Diagnosis not present

## 2018-04-12 DIAGNOSIS — R7303 Prediabetes: Secondary | ICD-10-CM | POA: Diagnosis not present

## 2018-04-12 DIAGNOSIS — R208 Other disturbances of skin sensation: Secondary | ICD-10-CM | POA: Diagnosis not present

## 2018-04-12 DIAGNOSIS — M2550 Pain in unspecified joint: Secondary | ICD-10-CM | POA: Diagnosis not present

## 2018-04-12 LAB — CBC WITH DIFFERENTIAL/PLATELET
BASOS ABS: 0 10*3/uL (ref 0.0–0.1)
Basophils Relative: 0.8 % (ref 0.0–3.0)
EOS ABS: 0.1 10*3/uL (ref 0.0–0.7)
Eosinophils Relative: 1.2 % (ref 0.0–5.0)
HCT: 37.7 % (ref 36.0–46.0)
Hemoglobin: 12.9 g/dL (ref 12.0–15.0)
LYMPHS ABS: 1.6 10*3/uL (ref 0.7–4.0)
Lymphocytes Relative: 33.1 % (ref 12.0–46.0)
MCHC: 34.1 g/dL (ref 30.0–36.0)
MCV: 87.6 fl (ref 78.0–100.0)
Monocytes Absolute: 0.5 10*3/uL (ref 0.1–1.0)
Monocytes Relative: 10.2 % (ref 3.0–12.0)
NEUTROS ABS: 2.6 10*3/uL (ref 1.4–7.7)
NEUTROS PCT: 54.7 % (ref 43.0–77.0)
PLATELETS: 307 10*3/uL (ref 150.0–400.0)
RBC: 4.31 Mil/uL (ref 3.87–5.11)
RDW: 13.1 % (ref 11.5–15.5)
WBC: 4.8 10*3/uL (ref 4.0–10.5)

## 2018-04-12 LAB — VITAMIN B12: Vitamin B-12: 212 pg/mL (ref 211–911)

## 2018-04-12 LAB — SEDIMENTATION RATE: Sed Rate: 15 mm/hr (ref 0–30)

## 2018-04-12 LAB — HEMOGLOBIN A1C: HEMOGLOBIN A1C: 5.9 % (ref 4.6–6.5)

## 2018-04-12 LAB — VITAMIN D 25 HYDROXY (VIT D DEFICIENCY, FRACTURES): VITD: 40.85 ng/mL (ref 30.00–100.00)

## 2018-04-12 NOTE — Addendum Note (Signed)
Addended by: Lendon Collar on: 04/12/2018 11:37 AM   Modules accepted: Orders

## 2018-04-13 ENCOUNTER — Telehealth: Payer: Self-pay | Admitting: Family Medicine

## 2018-04-13 DIAGNOSIS — E538 Deficiency of other specified B group vitamins: Secondary | ICD-10-CM | POA: Insufficient documentation

## 2018-04-13 NOTE — Telephone Encounter (Signed)
Copied from [place] [id number]. Topic: Appointment Scheduling - Scheduling Inquiry for Clinic >> Apr 13, 2018 12:40 PM Robina Ade, Helene Kelp D wrote: Reason for CRM: Patient looked at there lab results and would like a better clarification of it but also start coming in for her B12 injection and is not sure when she will start and how many she needs a week. Please call patient back, thanks.

## 2018-04-13 NOTE — Telephone Encounter (Signed)
Dr. Marliss Coots comments are on her lab results. Pt didn't answer so I routed the labs to [facility] triage nurse. If pt calls back please have NT advise pt of Dr. Marliss Coots comments and schedule nurse visit to have B12 inj. CRM created

## 2018-04-14 ENCOUNTER — Ambulatory Visit (INDEPENDENT_AMBULATORY_CARE_PROVIDER_SITE_OTHER): Payer: BLUE CROSS/BLUE SHIELD | Admitting: *Deleted

## 2018-04-14 DIAGNOSIS — E538 Deficiency of other specified B group vitamins: Secondary | ICD-10-CM

## 2018-04-14 LAB — RHEUMATOID FACTOR

## 2018-04-14 LAB — ANA: ANA: NEGATIVE

## 2018-04-14 MED ORDER — CYANOCOBALAMIN 1000 MCG/ML IJ SOLN
1000.0000 ug | Freq: Once | INTRAMUSCULAR | Status: AC
  Administered 2018-04-14: 1000 ug via INTRAMUSCULAR

## 2018-04-14 NOTE — Telephone Encounter (Signed)
Pt has been advise of this info under result notes

## 2018-04-14 NOTE — Telephone Encounter (Addendum)
Spoke to the patient, she wants to wait on the New Allergy Referral at this time. She will call us when she decides what she wants to do.

## 2018-04-21 ENCOUNTER — Ambulatory Visit (INDEPENDENT_AMBULATORY_CARE_PROVIDER_SITE_OTHER): Payer: BLUE CROSS/BLUE SHIELD | Admitting: *Deleted

## 2018-04-21 ENCOUNTER — Telehealth: Payer: Self-pay | Admitting: Family Medicine

## 2018-04-21 DIAGNOSIS — E538 Deficiency of other specified B group vitamins: Secondary | ICD-10-CM

## 2018-04-21 MED ORDER — CYANOCOBALAMIN 1000 MCG/ML IJ SOLN
1000.0000 ug | Freq: Once | INTRAMUSCULAR | Status: AC
  Administered 2018-04-21: 1000 ug via INTRAMUSCULAR

## 2018-04-21 NOTE — Telephone Encounter (Signed)
Left message asking pt to call office please transfer toRobin @ [facility]

## 2018-04-21 NOTE — Progress Notes (Signed)
Per orders of Dr. Tower, injection of b12 given by Finnbar Cedillos H. Patient tolerated injection well.  

## 2018-04-21 NOTE — Telephone Encounter (Signed)
Copied from [place] [id number]. Topic: Quick Communication - See Telephone Encounter >> Apr 21, 2018  9:30 AM Aurelio Brash B wrote: CRM for notification. See Telephone encounter for: 04/21/18. PT is asking for Shirlean Mylar to call her back about scheduling

## 2018-04-21 NOTE — Telephone Encounter (Signed)
Spoke with pt she wanted to keep 6/13 appointment

## 2018-04-28 ENCOUNTER — Ambulatory Visit (INDEPENDENT_AMBULATORY_CARE_PROVIDER_SITE_OTHER): Payer: BLUE CROSS/BLUE SHIELD

## 2018-04-28 DIAGNOSIS — E538 Deficiency of other specified B group vitamins: Secondary | ICD-10-CM | POA: Diagnosis not present

## 2018-04-28 MED ORDER — CYANOCOBALAMIN 1000 MCG/ML IJ SOLN
1000.0000 ug | Freq: Once | INTRAMUSCULAR | Status: AC
  Administered 2018-04-28: 1000 ug via INTRAMUSCULAR

## 2018-04-28 NOTE — Progress Notes (Signed)
Per orders of Dr. Tower, injection of Vit B 12 given by Duval Macleod. Patient tolerated injection well. 

## 2018-05-04 ENCOUNTER — Ambulatory Visit: Payer: Self-pay

## 2018-05-05 ENCOUNTER — Ambulatory Visit: Payer: Self-pay

## 2018-05-05 ENCOUNTER — Ambulatory Visit (INDEPENDENT_AMBULATORY_CARE_PROVIDER_SITE_OTHER): Payer: BLUE CROSS/BLUE SHIELD | Admitting: Emergency Medicine

## 2018-05-05 DIAGNOSIS — E538 Deficiency of other specified B group vitamins: Secondary | ICD-10-CM

## 2018-05-05 MED ORDER — CYANOCOBALAMIN 1000 MCG/ML IJ SOLN
1000.0000 ug | Freq: Once | INTRAMUSCULAR | Status: AC
  Administered 2018-05-05: 1000 ug via INTRAMUSCULAR

## 2018-05-05 NOTE — Progress Notes (Signed)
Per orders of Dr.Tower , injection of B12 given by Elmon Kirschner. Patient tolerated injection well. Left arm

## 2018-05-08 ENCOUNTER — Telehealth: Payer: Self-pay | Admitting: Family Medicine

## 2018-05-08 DIAGNOSIS — E538 Deficiency of other specified B group vitamins: Secondary | ICD-10-CM

## 2018-05-08 NOTE — Telephone Encounter (Signed)
-----   Message from Lendon Collar, RT sent at 05/03/2018 10:34 AM EDT ----- Regarding: Lab orders for Thursday 05/12/18 Looks like patient is scheduled for lab work to check her B12 next Thursday. If so, please enter orders. Thanks-Lauren

## 2018-05-10 DIAGNOSIS — G43719 Chronic migraine without aura, intractable, without status migrainosus: Secondary | ICD-10-CM | POA: Diagnosis not present

## 2018-05-10 DIAGNOSIS — G43019 Migraine without aura, intractable, without status migrainosus: Secondary | ICD-10-CM | POA: Diagnosis not present

## 2018-05-12 ENCOUNTER — Other Ambulatory Visit: Payer: BLUE CROSS/BLUE SHIELD

## 2018-05-17 ENCOUNTER — Other Ambulatory Visit (INDEPENDENT_AMBULATORY_CARE_PROVIDER_SITE_OTHER): Payer: BLUE CROSS/BLUE SHIELD

## 2018-05-17 ENCOUNTER — Ambulatory Visit: Payer: BLUE CROSS/BLUE SHIELD

## 2018-05-17 DIAGNOSIS — E538 Deficiency of other specified B group vitamins: Secondary | ICD-10-CM | POA: Diagnosis not present

## 2018-05-17 LAB — VITAMIN B12: Vitamin B-12: 1120 pg/mL — ABNORMAL HIGH (ref 211–911)

## 2018-05-18 DIAGNOSIS — G43719 Chronic migraine without aura, intractable, without status migrainosus: Secondary | ICD-10-CM | POA: Diagnosis not present

## 2018-06-08 DIAGNOSIS — M542 Cervicalgia: Secondary | ICD-10-CM | POA: Diagnosis not present

## 2018-06-08 DIAGNOSIS — G43719 Chronic migraine without aura, intractable, without status migrainosus: Secondary | ICD-10-CM | POA: Diagnosis not present

## 2018-06-08 DIAGNOSIS — G518 Other disorders of facial nerve: Secondary | ICD-10-CM | POA: Diagnosis not present

## 2018-06-08 DIAGNOSIS — G43019 Migraine without aura, intractable, without status migrainosus: Secondary | ICD-10-CM | POA: Diagnosis not present

## 2018-06-08 DIAGNOSIS — M791 Myalgia, unspecified site: Secondary | ICD-10-CM | POA: Diagnosis not present

## 2018-06-29 ENCOUNTER — Other Ambulatory Visit: Payer: Self-pay | Admitting: Family Medicine

## 2018-06-29 ENCOUNTER — Telehealth: Payer: Self-pay | Admitting: Family Medicine

## 2018-06-29 NOTE — Telephone Encounter (Signed)
Alprazolam 0.5 mg was already filled to day to [facility].

## 2018-06-29 NOTE — Telephone Encounter (Signed)
Copied from [place] [id number]. Topic: Quick Communication - Rx Refill/Question >> Jun 29, 2018  9:42 AM Gardiner Ramus wrote: Medication:ALPRAZolam Duanne Moron) 0.5 MG tablet [[id number]]   Has the patient contacted their pharmacy? No   Preferred Pharmacy (with phone number or street name): [id number] [place] [phone] (Phone) [phone] (Fax)   Agent: Please be advised that RX refills may take up to 3 business days. We ask that you follow-up with your pharmacy.

## 2018-06-29 NOTE — Telephone Encounter (Signed)
alprazolam refill Last Refill: 11/24/17 # 15 Last OV: 11/10/17 PCP: Dr Glori Bickers Pharmacy: [facility] [address] [place], [place]

## 2018-06-29 NOTE — Telephone Encounter (Signed)
Name of Medication: xanax Name of Pharmacy: [facility] Date and Quantity: 11/24/17 #15 with 0 refills Last Office Visit and Type: CPE 11/10/17 Next Office Visit and Type:  Lab only on 07/19/18 Last Controlled Substance Agreement Date: 10/01/14  Last UDS: 10/01/14 (per Dr. Glori Bickers no additional UDS need to be done)

## 2018-07-08 ENCOUNTER — Telehealth: Payer: Self-pay | Admitting: *Deleted

## 2018-07-08 NOTE — Telephone Encounter (Signed)
Patient called and left message c/o breast pain in left breast, I called patient back and received her voicemail. I told her to schedule OV with provider

## 2018-07-11 ENCOUNTER — Encounter: Payer: Self-pay | Admitting: Gynecology

## 2018-07-11 ENCOUNTER — Ambulatory Visit: Payer: BLUE CROSS/BLUE SHIELD | Admitting: Gynecology

## 2018-07-11 VITALS — BP 118/78

## 2018-07-11 DIAGNOSIS — N644 Mastodynia: Secondary | ICD-10-CM | POA: Diagnosis not present

## 2018-07-11 NOTE — Patient Instructions (Signed)
Call if you decide that you want [place] to schedule an MRI.

## 2018-07-11 NOTE — Progress Notes (Signed)
    Jennifer [name] [birth year] [id number]        62 y.o.  G0P0 presents complaining of intermittent left breast tenderness.  Patient notes actually has been going on for years but seems to have gotten worse particularly since April.  Went and had a mammography which on the right showed some calcifications and on magnification views they felt these were benign and recommended a six-month follow-up.  On the left they saw no abnormalities.  Follow-up ultrasound on the left showed normal appearing breast parenchyma.  The radiologist's exam was negative.  The patient notes since then her pain has persisted coming and going.  No palpable abnormalities on self breast exam.  No nipple discharge  Past medical history,surgical history, problem list, medications, allergies, family history and social history were all reviewed and documented in the EPIC chart.  Directed ROS with pertinent positives and negatives documented in the history of present illness/assessment and plan.  Exam: Caryn Bee assistant Vitals:   07/11/18 1547  BP: 118/78   General appearance:  Normal Both breasts examined lying and sitting without masses, retractions, discharge, adenopathy. , Assessment/Plan:  62 y.o. G0P0 with history of intermittent breast tenderness primarily left tail of Spence coming and going that she has noticed for years but seems to progressively have gotten worse.  She currently is on [facility].  Exam today is normal.  Recent diagnostic mammography and ultrasound were negative.  We discussed various possibilities to include hormonal as a source of the discomfort as well as breast cancer.  It is not a constant discomfort but comes and goes.  Options for management reviewed to include expectant management, stopping the HRT for several weeks and see how she does, order MRI for additional information.  The pros and cons of each choice were reviewed.  At this point the patient desires no intervention but will continue to  monitor.  She is undergoing some medication changes for her migraines and she is reluctant to stop the hormones feeling that it may contribute to migraine issues.  Does not want to pursue the MRI at this time but will call if she changes her mind and we will order this for her.  Reimbursement issues reviewed.  I spent a total of 15 face-to-face minutes with the patient, over 50% was spent counseling and coordination of care.     Anastasio Auerbach MD, 4:49 PM 07/11/2018

## 2018-07-19 ENCOUNTER — Telehealth: Payer: Self-pay

## 2018-07-19 ENCOUNTER — Other Ambulatory Visit: Payer: BLUE CROSS/BLUE SHIELD

## 2018-07-19 DIAGNOSIS — M542 Cervicalgia: Secondary | ICD-10-CM | POA: Diagnosis not present

## 2018-07-19 DIAGNOSIS — G43019 Migraine without aura, intractable, without status migrainosus: Secondary | ICD-10-CM | POA: Diagnosis not present

## 2018-07-19 DIAGNOSIS — M791 Myalgia, unspecified site: Secondary | ICD-10-CM | POA: Diagnosis not present

## 2018-07-19 DIAGNOSIS — G43719 Chronic migraine without aura, intractable, without status migrainosus: Secondary | ICD-10-CM | POA: Diagnosis not present

## 2018-07-19 DIAGNOSIS — G518 Other disorders of facial nerve: Secondary | ICD-10-CM | POA: Diagnosis not present

## 2018-07-19 NOTE — Telephone Encounter (Signed)
PLEASE NOTE: All timestamps contained within this report are represented as [place] Standard Time. CONFIDENTIALTY NOTICE: This fax transmission is intended only for the addressee. It contains information that is legally privileged, confidential or otherwise protected from use or disclosure. If you are not the intended recipient, you are strictly prohibited from reviewing, disclosing, copying using or disseminating any of this information or taking any action in reliance on or regarding this information. If you have received this fax in error, please notify us immediately by telephone so that we can arrange for its return to [place]. Phone: [phone], Toll-Free: [phone], Fax: [phone] Page: 1 of 1 Call Id: [id number] [place] Night - Client Nonclinical Telephone Record [place] Night - Client Client Site [place] Physician Glori Bickers, [place] MD Contact Type Call Who Is Calling Patient / Member / Family / Caregiver Caller Name [place] Phone Number [phone] Patient Name Jennifer [name] Patient DOB 03-05-1956 Call Type Message Only Information Provided Reason for Call Request to [facility] Appointment Initial Comment Needing to cancel an appt. Additional Comment Call Closed By: Mauri Pole Transaction Date/Time: 07/19/2018 2:29:29 AM (ET)

## 2018-08-05 ENCOUNTER — Other Ambulatory Visit: Payer: Self-pay | Admitting: Family Medicine

## 2018-08-05 ENCOUNTER — Encounter: Payer: Self-pay | Admitting: Family Medicine

## 2018-08-05 ENCOUNTER — Ambulatory Visit: Payer: BLUE CROSS/BLUE SHIELD | Admitting: Family Medicine

## 2018-08-05 VITALS — BP 116/70 | HR 68 | Temp 98.1°F | Ht 61.0 in | Wt 122.8 lb

## 2018-08-05 DIAGNOSIS — E538 Deficiency of other specified B group vitamins: Secondary | ICD-10-CM

## 2018-08-05 DIAGNOSIS — Z23 Encounter for immunization: Secondary | ICD-10-CM | POA: Diagnosis not present

## 2018-08-05 DIAGNOSIS — B001 Herpesviral vesicular dermatitis: Secondary | ICD-10-CM

## 2018-08-05 LAB — VITAMIN B12: Vitamin B-12: >1500 pg/mL — ABNORMAL HIGH (ref 211–911)

## 2018-08-05 MED ORDER — VALACYCLOVIR HCL 1 G PO TABS: 2.0000 g | ORAL_TABLET | Freq: Two times a day (BID) | ORAL | 1 refills | Status: DC

## 2018-08-05 NOTE — Assessment & Plan Note (Signed)
Cold sore R upper lip-just started  Disc valacyclovir tx 2 g bid for 1 d prn cold sore  Px sent to pharmacy  Disc getting rest/self care   Also disc shingrix vaccine for prev of shingles as well  She will call pharmacy to get on a wait list

## 2018-08-05 NOTE — Patient Instructions (Addendum)
For a cold sore- take 2 g of valtrex twice daily for one day  I sent px to your pharmacy   If you are interested in the new shingles vaccine (Shingrix) - call your local pharmacy to check on coverage and availability  If affordable have your pharmacy put you on a wait list   Labs for B12 today

## 2018-08-05 NOTE — Telephone Encounter (Signed)
PLEASE NOTE: All timestamps contained within this report are represented as [place] Standard Time. CONFIDENTIALTY NOTICE: This fax transmission is intended only for the addressee. It contains information that is legally privileged, confidential or otherwise protected from use or disclosure. If you are not the intended recipient, you are strictly prohibited from reviewing, disclosing, copying using or disseminating any of this information or taking any action in reliance on or regarding this information. If you have received this fax in error, please notify us immediately by telephone so that we can arrange for its return to [place]. Phone: [phone], Toll-Free: [phone], Fax: [phone] Page: 1 of 1 Call Id: [id number] [place] Patient Name: GENINE BECKETT Gender: Female DOB: 1956/03/13 Age: 62 Y 10 M 4 D Return Phone Number: [phone] (Primary) Address: [place] City/State/Zip: [place] [address] Client [place] Night - Client Client Site [place] Physician Tower, Roque Lias - MD Contact Type Call Who Is Calling Patient / Member / Family / Caregiver Call Type Triage / Clinical Relationship To Patient Self Return Phone Number [phone] (Primary) Chief Complaint Prescription Refill or Medication Request (non symptomatic) Reason for Call Medication Question / Request Initial Comment Caller needs a refill and no symptoms. Translation No Nurse Assessment Guidelines Guideline Title Affirmed Question Affirmed Notes Nurse Date/Time (Eastern Time) Disp. Time Eilene Ghazi Time) Disposition Final User 08/05/2018 1:42:18 AM Attempt made - message left Carmon, RN, Langley Gauss 08/05/2018 1:43:16 AM Attempt made - message left Carmon, RN, Langley Gauss 08/05/2018 2:13:08 AM FINAL ATTEMPT MADE - message left Yes Carmon, RN, Langley Gauss

## 2018-08-05 NOTE — Progress Notes (Signed)
Subjective:    Patient ID: Jennifer [name], female    DOB: Mar 20, 1956, 62 y.o.   MRN: [id number]  HPI Here for cold sore on lip  Yesterday afternoon- started the tingling    Wednesday woke up with a lot of sneezing and runny nose  Thursday-same - treated a migraine as well  Got better the next day    Still gets migraines- one today  Has started botox treatments- may be starting to improve   Desires labs - overdue for B12 check   Hypothyroidism  Pt has no clinical changes No change in energy level/ hair or skin/ edema and no tremor Lab Results  Component Value Date   TSH 1.04 11/08/2017     Prediabetes Lab Results  Component Value Date   HGBA1C 5.9 04/12/2018    B12 def Lab Results  Component Value Date   VITAMINB12 1,120 (H) 05/17/2018  last B12 shot was in June (had 4 in 4 weeks)  Taking 500 mcg twice daily  Takes it very compliantly  Still quite tired lately-but this may be from a very busy schedule /long hours  Patient Active Problem List   Diagnosis Date Noted  . B12 deficiency 04/13/2018  . Joint pain 04/11/2018  . Skin pain 04/11/2018  . Paresthesia 04/11/2018  . Breast pain, left 02/24/2018  . Rash 09/24/2017  . Colon cancer screening 09/16/2017  . Elevated troponin 06/02/2017  . Ear pain, bilateral 05/24/2017  . Prediabetes 07/28/2016  . Allergic rhinitis 11/13/2014  . Adenopathy, cervical 10/30/2014  . Screening for lipoid disorders 09/26/2014  . Preventative health care 09/26/2014  . Routine general medical examination at a health care facility 10/28/2012  . Herpes labialis 04/28/2011  . GERD 09/16/2010  . Hypothyroidism 05/07/2008  . Osteopenia 05/07/2008  . Migraine with aura 11/11/2007   Past Medical History:  Diagnosis Date  . Allergic rhinitis   . Cervical dysplasia   . Endometriosis   . Esophageal reflux   . Hypothyroid   . Migraine with aura, without mention of intractable migraine without mention of status migrainosus   .  Osteopenia 11/2016   T score -2.2 FRAX 9.9%/1.6%   Past Surgical History:  Procedure Laterality Date  . BREAST EXCISIONAL BIOPSY Bilateral over 10 years ago   benign x 2   . BREAST SURGERY     Benign breast lump excised  . CERVICAL CONE BIOPSY  1985   severe dysplasia  . COLONOSCOPY  2005   normal  . COLPOSCOPY    . ROTATOR CUFF REPAIR  08/2008   Social History   Tobacco Use  . Smoking status: Never Smoker  . Smokeless tobacco: Never Used  Substance Use Topics  . Alcohol use: No    Alcohol/week: 0.0 standard drinks  . Drug use: No   Family History  Problem Relation Age of Onset  . Breast cancer Mother 60  . Diabetes Mother   . Hypertension Father   . Heart disease Father   . Osteoporosis Maternal Grandmother   . Diabetes Maternal Grandmother   . Hypertension Sister    Allergies  Allergen Reactions  . Codeine Phosphate Other (See Comments)    Dizziness, heart palpitations, nervous  . Flonase [Fluticasone Propionate] Other (See Comments)    Worsens her migraine   . Hydrocodone     REACTION: made nervous--10/09 surgery rotator cuff  . Benadryl [Diphenhydramine Hcl] Palpitations  . Sulfa Antibiotics Anxiety   Current Outpatient Medications on File Prior to Visit  Medication Sig  Dispense Refill  . ALPRAZolam (XANAX) 0.5 MG tablet TAKE 1/2 TO 1 TABLET BY MOUTH ONCE DAILY AS NEEDED FOR SLEEP/ANXIETY 15 tablet 0  . atenolol (TENORMIN) 25 MG tablet Take 25 mg by mouth daily.     . baclofen (LIORESAL) 10 MG tablet Take 10 mg by mouth as needed. For migraine prevention     . Calcium Carbonate-Vitamin D 600-400 MG-UNIT tablet Take 2 tablets by mouth daily. Totaling 1200 mg    . famotidine (PEPCID) 40 MG tablet Take 40 mg by mouth daily.    . hydrOXYzine (ATARAX/VISTARIL) 25 MG tablet Take 1 tablet (25 mg total) by mouth 3 (three) times daily as needed. 30 tablet 0  . ketorolac (TORADOL) 10 MG tablet Take 1 tablet (10 mg total) by mouth every 6 (six) hours as needed. 20  tablet 0  . levocetirizine (XYZAL) 5 MG tablet Take 1 tablet by mouth every evening.  3  . levothyroxine (SYNTHROID, LEVOTHROID) 75 MCG tablet Take 1 tablet (75 mcg total) by mouth daily. 30 tablet 11  . nabumetone (RELAFEN) 500 MG tablet Take 500 mg by mouth daily as needed.     . norethindrone-ethinyl estradiol (JINTELI) 1-5 MG-MCG TABS tablet Take 1 tablet by mouth daily. 84 tablet 4  . Omega-3 Fatty Acids (FISH OIL) 1000 MG CAPS Take by mouth.    . OnabotulinumtoxinA (BOTOX IJ) Inject as directed.    . pantoprazole (PROTONIX) 40 MG tablet TAKE 1 TABLET (40 MG TOTAL) BY MOUTH DAILY. 30 tablet 11  . promethazine (PHENERGAN) 25 MG tablet Take 25 mg by mouth as needed. When having a migraine    . rizatriptan (MAXALT-MLT) 10 MG disintegrating tablet   1   No current facility-administered medications on file prior to visit.     Review of Systems  Constitutional: Positive for fatigue. Negative for activity change, appetite change, fever and unexpected weight change.  HENT: Negative for congestion, ear pain, rhinorrhea, sinus pressure and sore throat.   Eyes: Negative for pain, redness and visual disturbance.  Respiratory: Negative for cough, shortness of breath and wheezing.   Cardiovascular: Negative for chest pain and palpitations.  Gastrointestinal: Negative for abdominal pain, blood in stool, constipation and diarrhea.  Endocrine: Negative for polydipsia and polyuria.  Genitourinary: Negative for dysuria, frequency and urgency.  Musculoskeletal: Negative for arthralgias, back pain and myalgias.  Skin: Negative for pallor and rash.       Cold sore   Allergic/Immunologic: Negative for environmental allergies.  Neurological: Negative for dizziness, syncope and headaches.  Hematological: Negative for adenopathy. Does not bruise/bleed easily.  Psychiatric/Behavioral: Negative for decreased concentration and dysphoric mood. The patient is not nervous/anxious.        Objective:   Physical  Exam  Constitutional: She appears well-developed and well-nourished. No distress.  Well appearing   HENT:  Head: Normocephalic and atraumatic.  Mouth/Throat: Oropharynx is clear and moist.  Cold sore L upper lip  Nares boggy  Eyes: Pupils are equal, round, and reactive to light. Conjunctivae and EOM are normal.  Neck: Normal range of motion. Neck supple. No thyromegaly present.  Cardiovascular: Normal rate, regular rhythm and normal heart sounds.  Pulmonary/Chest: Effort normal and breath sounds normal. No respiratory distress. She has no wheezes.  Lymphadenopathy:    She has no cervical adenopathy.  Neurological: She is alert. No cranial nerve deficit.  Skin: Skin is warm and dry. No rash noted.  Cold sore with surrounding edema L upper lip   Psychiatric: She has a  normal mood and affect.  Fatigued  Pleasant           Assessment & Plan:   Problem List Items Addressed This Visit      Digestive   Herpes labialis - Primary    Cold sore R upper lip-just started  Disc valacyclovir tx 2 g bid for 1 d prn cold sore  Px sent to pharmacy  Disc getting rest/self care   Also disc shingrix vaccine for prev of shingles as well  She will call pharmacy to get on a wait list        Relevant Medications   valACYclovir (VALTREX) 1000 MG tablet     Other   B12 deficiency    Level of 212 in may  Given 4 shots in 4 weeks- then level over 1000 in June Now taking 500 mcg orally bid  Level today and advise       Relevant Orders   Vitamin B12

## 2018-08-05 NOTE — Assessment & Plan Note (Signed)
Level of 212 in may  Given 4 shots in 4 weeks- then level over 1000 in June Now taking 500 mcg orally bid  Level today and advise

## 2018-08-14 ENCOUNTER — Encounter: Payer: Self-pay | Admitting: Family Medicine

## 2018-08-16 DIAGNOSIS — G43719 Chronic migraine without aura, intractable, without status migrainosus: Secondary | ICD-10-CM | POA: Diagnosis not present

## 2018-09-02 DIAGNOSIS — R1010 Upper abdominal pain, unspecified: Secondary | ICD-10-CM | POA: Diagnosis not present

## 2018-09-02 DIAGNOSIS — Z8371 Family history of colonic polyps: Secondary | ICD-10-CM | POA: Diagnosis not present

## 2018-09-02 DIAGNOSIS — R1013 Epigastric pain: Secondary | ICD-10-CM | POA: Diagnosis not present

## 2018-09-02 DIAGNOSIS — R21 Rash and other nonspecific skin eruption: Secondary | ICD-10-CM | POA: Diagnosis not present

## 2018-09-06 ENCOUNTER — Other Ambulatory Visit: Payer: Self-pay | Admitting: Family Medicine

## 2018-09-06 ENCOUNTER — Ambulatory Visit
Admission: RE | Admit: 2018-09-06 | Discharge: 2018-09-06 | Disposition: A | Discharge status: Home / Self Care | Payer: BLUE CROSS/BLUE SHIELD | Source: Ambulatory Visit | Attending: Family Medicine | Admitting: Family Medicine

## 2018-09-06 DIAGNOSIS — M791 Myalgia, unspecified site: Secondary | ICD-10-CM | POA: Diagnosis not present

## 2018-09-06 DIAGNOSIS — N6312 Unspecified lump in the right breast, upper inner quadrant: Secondary | ICD-10-CM | POA: Diagnosis not present

## 2018-09-06 DIAGNOSIS — G518 Other disorders of facial nerve: Secondary | ICD-10-CM | POA: Diagnosis not present

## 2018-09-06 DIAGNOSIS — M542 Cervicalgia: Secondary | ICD-10-CM | POA: Diagnosis not present

## 2018-09-06 DIAGNOSIS — N6310 Unspecified lump in the right breast, unspecified quadrant: Secondary | ICD-10-CM

## 2018-09-06 DIAGNOSIS — R928 Other abnormal and inconclusive findings on diagnostic imaging of breast: Secondary | ICD-10-CM

## 2018-09-06 DIAGNOSIS — R922 Inconclusive mammogram: Secondary | ICD-10-CM | POA: Diagnosis not present

## 2018-09-06 DIAGNOSIS — G43019 Migraine without aura, intractable, without status migrainosus: Secondary | ICD-10-CM | POA: Diagnosis not present

## 2018-09-06 DIAGNOSIS — G43719 Chronic migraine without aura, intractable, without status migrainosus: Secondary | ICD-10-CM | POA: Diagnosis not present

## 2018-09-06 DIAGNOSIS — R921 Mammographic calcification found on diagnostic imaging of breast: Secondary | ICD-10-CM

## 2018-09-13 ENCOUNTER — Ambulatory Visit
Admission: RE | Admit: 2018-09-13 | Discharge: 2018-09-13 | Disposition: A | Discharge status: Home / Self Care | Payer: BLUE CROSS/BLUE SHIELD | Source: Ambulatory Visit | Attending: Family Medicine | Admitting: Family Medicine

## 2018-09-13 ENCOUNTER — Other Ambulatory Visit: Payer: Self-pay | Admitting: Family Medicine

## 2018-09-13 DIAGNOSIS — N6310 Unspecified lump in the right breast, unspecified quadrant: Secondary | ICD-10-CM

## 2018-09-13 DIAGNOSIS — L298 Other pruritus: Secondary | ICD-10-CM | POA: Diagnosis not present

## 2018-09-13 DIAGNOSIS — N6011 Diffuse cystic mastopathy of right breast: Secondary | ICD-10-CM | POA: Diagnosis not present

## 2018-09-13 DIAGNOSIS — N6312 Unspecified lump in the right breast, upper inner quadrant: Secondary | ICD-10-CM | POA: Diagnosis not present

## 2018-09-13 DIAGNOSIS — R21 Rash and other nonspecific skin eruption: Secondary | ICD-10-CM | POA: Diagnosis not present

## 2018-09-13 DIAGNOSIS — J301 Allergic rhinitis due to pollen: Secondary | ICD-10-CM | POA: Diagnosis not present

## 2018-09-13 DIAGNOSIS — C50211 Malignant neoplasm of upper-inner quadrant of right female breast: Secondary | ICD-10-CM | POA: Diagnosis not present

## 2018-09-15 ENCOUNTER — Telehealth: Payer: Self-pay | Admitting: Hematology and Oncology

## 2018-09-15 NOTE — Telephone Encounter (Signed)
Spoke to patient to confirm [facility] appointment for 10/30, packet will be mailed and emailed to patient

## 2018-09-20 ENCOUNTER — Other Ambulatory Visit: Payer: Self-pay

## 2018-09-20 ENCOUNTER — Encounter: Payer: Self-pay | Admitting: *Deleted

## 2018-09-20 DIAGNOSIS — Z170 Estrogen receptor positive status [ER+]: Secondary | ICD-10-CM | POA: Insufficient documentation

## 2018-09-20 DIAGNOSIS — C50211 Malignant neoplasm of upper-inner quadrant of right female breast: Secondary | ICD-10-CM

## 2018-09-21 ENCOUNTER — Encounter: Payer: Self-pay | Admitting: Physical Therapy

## 2018-09-21 ENCOUNTER — Other Ambulatory Visit: Payer: Self-pay

## 2018-09-21 ENCOUNTER — Ambulatory Visit: Payer: BLUE CROSS/BLUE SHIELD | Attending: General Surgery | Admitting: Physical Therapy

## 2018-09-21 ENCOUNTER — Encounter: Payer: Self-pay | Admitting: Hematology and Oncology

## 2018-09-21 ENCOUNTER — Ambulatory Visit
Admission: RE | Admit: 2018-09-21 | Discharge: 2018-09-21 | Disposition: A | Discharge status: Home / Self Care | Payer: BLUE CROSS/BLUE SHIELD | Source: Ambulatory Visit | Attending: Radiation Oncology | Admitting: Radiation Oncology

## 2018-09-21 ENCOUNTER — Other Ambulatory Visit: Payer: Self-pay | Admitting: *Deleted

## 2018-09-21 ENCOUNTER — Inpatient Hospital Stay: Payer: BLUE CROSS/BLUE SHIELD

## 2018-09-21 ENCOUNTER — Inpatient Hospital Stay: Payer: BLUE CROSS/BLUE SHIELD | Attending: Hematology and Oncology | Admitting: Hematology and Oncology

## 2018-09-21 DIAGNOSIS — Z170 Estrogen receptor positive status [ER+]: Secondary | ICD-10-CM

## 2018-09-21 DIAGNOSIS — Z79899 Other long term (current) drug therapy: Secondary | ICD-10-CM | POA: Insufficient documentation

## 2018-09-21 DIAGNOSIS — Z803 Family history of malignant neoplasm of breast: Secondary | ICD-10-CM | POA: Diagnosis not present

## 2018-09-21 DIAGNOSIS — C50211 Malignant neoplasm of upper-inner quadrant of right female breast: Secondary | ICD-10-CM | POA: Diagnosis not present

## 2018-09-21 DIAGNOSIS — C50311 Malignant neoplasm of lower-inner quadrant of right female breast: Secondary | ICD-10-CM | POA: Diagnosis not present

## 2018-09-21 DIAGNOSIS — G43019 Migraine without aura, intractable, without status migrainosus: Secondary | ICD-10-CM | POA: Diagnosis not present

## 2018-09-21 DIAGNOSIS — R293 Abnormal posture: Secondary | ICD-10-CM | POA: Diagnosis not present

## 2018-09-21 DIAGNOSIS — G518 Other disorders of facial nerve: Secondary | ICD-10-CM | POA: Diagnosis not present

## 2018-09-21 DIAGNOSIS — G43719 Chronic migraine without aura, intractable, without status migrainosus: Secondary | ICD-10-CM | POA: Diagnosis not present

## 2018-09-21 DIAGNOSIS — M791 Myalgia, unspecified site: Secondary | ICD-10-CM | POA: Diagnosis not present

## 2018-09-21 DIAGNOSIS — M542 Cervicalgia: Secondary | ICD-10-CM | POA: Diagnosis not present

## 2018-09-21 LAB — CMP (CANCER CENTER ONLY)
ALT: 32 U/L (ref 0–44)
ANION GAP: 7 (ref 5–15)
AST: 22 U/L (ref 15–41)
Albumin: 3.8 g/dL (ref 3.5–5.0)
Alkaline Phosphatase: 78 U/L (ref 38–126)
BUN: 14 mg/dL (ref 8–23)
CHLORIDE: 99 mmol/L (ref 98–111)
CO2: 29 mmol/L (ref 22–32)
Calcium: 9.6 mg/dL (ref 8.9–10.3)
Creatinine: 0.83 mg/dL (ref 0.44–1.00)
GFR, Est AFR Am: >60 mL/min (ref >60)
GFR, Estimated: >60 mL/min (ref >60)
GLUCOSE: 98 mg/dL (ref 70–99)
POTASSIUM: 4.5 mmol/L (ref 3.5–5.1)
Sodium: 135 mmol/L (ref 135–145)
Total Bilirubin: 0.6 mg/dL (ref 0.3–1.2)
Total Protein: 7.4 g/dL (ref 6.5–8.1)

## 2018-09-21 LAB — CBC WITH DIFFERENTIAL (CANCER CENTER ONLY)
Abs Immature Granulocytes: 0.05 10*3/uL (ref 0.00–0.07)
BASOS PCT: 0 %
Basophils Absolute: 0.1 10*3/uL (ref 0.0–0.1)
EOS ABS: 0.4 10*3/uL (ref 0.0–0.5)
Eosinophils Relative: 3 %
HCT: 36.7 % (ref 36.0–46.0)
Hemoglobin: 12.2 g/dL (ref 12.0–15.0)
Immature Granulocytes: 0 %
LYMPHS ABS: 0.9 10*3/uL (ref 0.7–4.0)
Lymphocytes Relative: 7 %
MCH: 28.8 pg (ref 26.0–34.0)
MCHC: 33.2 g/dL (ref 30.0–36.0)
MCV: 86.8 fL (ref 80.0–100.0)
MONO ABS: 0.5 10*3/uL (ref 0.1–1.0)
MONOS PCT: 4 %
Neutro Abs: 12.1 10*3/uL — ABNORMAL HIGH (ref 1.7–7.7)
Neutrophils Relative %: 86 %
PLATELETS: 300 10*3/uL (ref 150–400)
RBC: 4.23 MIL/uL (ref 3.87–5.11)
RDW: 13 % (ref 11.5–15.5)
WBC Count: 14.1 10*3/uL — ABNORMAL HIGH (ref 4.0–10.5)
nRBC: 0 % (ref 0.0–0.2)

## 2018-09-21 NOTE — Assessment & Plan Note (Signed)
09/13/2018:Diagnostic mammogram detected right breast mass with distortion LIQ at 2:00 9 cm from nipple 1.7 cm, at 1:00 there was a benign 1.2 cm fibrocystic change, biopsy of the 2:00 mass revealed grade 1 IDC with DCIS ER 100%, PR 90%, Ki-67 10%, HER-2 1+ negative, T1CN0 stage Ia clinical stage  Pathology and radiology counseling:Discussed with the patient, the details of pathology including the type of breast cancer,the clinical staging, the significance of ER, PR and HER-2/neu receptors and the implications for treatment. After reviewing the pathology in detail, we proceeded to discuss the different treatment options between surgery, radiation, chemotherapy, antiestrogen therapies.  Recommendations: 1. Breast conserving surgery followed by 2. Oncotype DX testing to determine if chemotherapy would be of any benefit followed by 3. Adjuvant radiation therapy followed by 4. Adjuvant antiestrogen therapy  Oncotype counseling: I discussed Oncotype DX test. I explained to the patient that this is a 21 gene panel to evaluate patient tumors DNA to calculate recurrence score. This would help determine whether patient has high risk or intermediate risk or low risk breast cancer. She understands that if her tumor was found to be high risk, she would benefit from systemic chemotherapy. If low risk, no need of chemotherapy. If she was found to be intermediate risk, we would need to evaluate the score as well as other risk factors and determine if an abbreviated chemotherapy may be of benefit.  Return to clinic after surgery to discuss final pathology report and then determine if Oncotype DX testing will need to be sent.

## 2018-09-21 NOTE — Therapy (Signed)
Jennifer [name], [place], [address] Phone: [phone]   Fax:  [phone]  Physical Therapy Evaluation  Patient Details  Name: Jennifer [name] MRN: [id number] Date of Birth: 02-Mar-1956 Referring Provider (PT): Dr. Autumn [name]   Encounter Date: 09/21/2018  PT End of Session - 09/21/18 1603    Visit Number  1    Number of Visits  2    Date for PT Re-Evaluation  11/16/18    PT Start Time  1502    PT Stop Time  1538    PT Time Calculation (min)  36 min    Activity Tolerance  Patient tolerated treatment well    Behavior During Therapy  [facility] for tasks assessed/performed       Past Medical History:  Diagnosis Date  . Allergic rhinitis   . Cervical dysplasia   . Endometriosis   . Esophageal reflux   . Hypothyroid   . Migraine with aura, without mention of intractable migraine without mention of status migrainosus   . Osteopenia 11/2016   T score -2.2 FRAX 9.9%/1.6%    Past Surgical History:  Procedure Laterality Date  . BREAST EXCISIONAL BIOPSY Bilateral over 10 years ago   benign x 2   . BREAST SURGERY     Benign breast lump excised  . CERVICAL CONE BIOPSY  1985   severe dysplasia  . COLONOSCOPY  2005   normal  . COLPOSCOPY    . ROTATOR CUFF REPAIR  08/2008    There were no vitals filed for this visit.   Subjective Assessment - 09/21/18 1557    Subjective  Patient reports she is here today to be seen by her medical team for her newly diagnosed right breast cancer.    Pertinent History  Patient was diagnosed on 09/06/18 with right grade I invasive ductal carcinoma breast cancer. It measures 1.7 cm and is located in the lower inner quadrant. It is ER/PR positive and HER2 negative with a Ki67 of 10%. Bilateral rotator cuff repair surgeries: right 2013; left 2016.    Patient Stated Goals  Lymphedema risk reduction and post op shoulder ROM HEP    Currently in Pain?  No/denies         [facility] PT  Assessment - 09/21/18 0001      Assessment   Medical Diagnosis  Right breast cancer    Referring Provider (PT)  Dr. Autumn [name]    Onset Date/Surgical Date  09/06/18    Hand Dominance  Right    Prior Therapy  none      Precautions   Precautions  Other (comment)    Precaution Comments  active cancer      Restrictions   Weight Bearing Restrictions  No      Balance Screen   Has the patient fallen in the past 6 months  No    Has the patient had a decrease in activity level because of a fear of falling?   No    Is the patient reluctant to leave their home because of a fear of falling?   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  Leisure  She walks 1 hour and uses her recumbant bike 15-20 min 3x/week      Cognition   Overall Cognitive Status  Within Functional Limits for tasks assessed      Posture/Postural Control   Posture/Postural Control  Postural limitations    Postural Limitations  Rounded Shoulders;Forward head      ROM / Strength   AROM / PROM / Strength  AROM;Strength      AROM   AROM Assessment Site  Shoulder;Cervical    Right/Left Shoulder  Right;Left    Right Shoulder Extension  55 Degrees    Right Shoulder Flexion  145 Degrees    Right Shoulder ABduction  165 Degrees    Right Shoulder Internal Rotation  43 Degrees    Right Shoulder External Rotation  90 Degrees    Left Shoulder Extension  59 Degrees    Left Shoulder Flexion  157 Degrees    Left Shoulder ABduction  168 Degrees    Left Shoulder Internal Rotation  55 Degrees    Left Shoulder External Rotation  90 Degrees    Cervical Flexion  WNL    Cervical Extension  WNL    Cervical - Right Side Bend  WNL    Cervical - Left Side Bend  WNL     Cervical - Right Rotation  WNL    Cervical - Left Rotation  WNL      Strength   Overall Strength  Within functional limits for tasks performed        LYMPHEDEMA/ONCOLOGY QUESTIONNAIRE - 09/21/18 1601      Type   Cancer Type  Right breast cancer      Lymphedema Assessments   Lymphedema Assessments  Upper extremities      Right Upper Extremity Lymphedema   10 cm Proximal to Olecranon Process  26.4 cm    Olecranon Process  23.1 cm    10 cm Proximal to Ulnar Styloid Process  20 cm    Just Proximal to Ulnar Styloid Process  13.5 cm    Across Hand at [employer]  16.1 cm    At [place] of 2nd Digit  5.5 cm      Left Upper Extremity Lymphedema   10 cm Proximal to Olecranon Process  26 cm    Olecranon Process  22.7 cm    10 cm Proximal to Ulnar Styloid Process  20.1 cm    Just Proximal to Ulnar Styloid Process  13.7 cm    Across Hand at [employer]  17.4 cm    At [place] of 2nd Digit  5.4 cm          Quick Dash - 09/21/18 0001    Open a tight or new jar  Mild difficulty    Do heavy household chores (wash walls, wash floors)  No difficulty    Carry a shopping bag or briefcase  No difficulty    Wash your back  No difficulty    Use a knife to cut food  No difficulty    Recreational activities in which you take some force or impact through your arm, shoulder, or hand (golf, hammering, tennis)  No difficulty    During the past week, to what extent has your arm, shoulder or hand problem interfered with your normal social activities with family, friends, neighbors, or groups?  Not at all    During the past week, to what extent has your arm, shoulder or hand problem limited your work or  other regular daily activities  Not at all    Arm, shoulder, or hand pain.  None    Tingling (pins and needles) in your arm, shoulder, or hand  None    Difficulty Sleeping  No difficulty    DASH Score  2.27 %        Objective measurements completed on examination: See above findings.          Patient was instructed today in a home exercise program today for post op shoulder range of motion. These included active assist shoulder flexion in sitting, scapular retraction, wall walking with shoulder abduction, and hands behind head external rotation.  She was encouraged to do these twice a day, holding 3 seconds and repeating 5 times when permitted by her physician.         PT Education - 09/21/18 1603    Education Details  Lymphedema risk reduction and post op shoulder ROM HEP    Person(s) Educated  Patient    Methods  Explanation;Demonstration;Handout    Comprehension  Returned demonstration;Verbalized understanding          PT Long Term Goals - 09/21/18 1606      PT LONG TERM GOAL #1   Title  Patient will demonstrate she has regained full shoulder ROM and function post operatively compared to baseline.    Time  [facility] Goals - 09/21/18 1606      Patient will be able to verbalize understanding of pertinent lymphedema risk reduction practices relevant to her diagnosis specifically related to skin care.   Time  1    Period  Days    Status  Achieved      Patient will be able to return demonstrate and/or verbalize understanding of the post-op home exercise program related to regaining shoulder range of motion.   Time  1    Period  Days    Status  Achieved      Patient will be able to verbalize understanding of the importance of attending the postoperative After Breast Cancer Class for further lymphedema risk reduction education and therapeutic exercise.   Time  1    Period  Days    Status  Achieved            Plan - 09/21/18 1603    Clinical Impression Statement  Patient was diagnosed on 09/06/18 with right grade I invasive ductal carcinoma breast cancer. It measures 1.7 cm and is located in the lower inner quadrant. It is ER/PR positive and HER2 negative with a Ki67 of 10%. Bilateral rotator cuff  repair surgeries: right 2013; left 2016. Her multidisciplinary medical team met prior to her assessments to determine a recommended treatment plan. She is planning to have a right lumpectomy and sentinel node biopsy followed by Oncotype testing, radiation, and anti-estrogen therapy. She will benefit from post op PT to regain shoulder ROM and reduce lymphedema risk.    History and Personal Factors relevant to plan of care:  Lives alone    Clinical Presentation  Stable    Clinical Decision Making  Low    Rehab Potential  Excellent    Clinical Impairments Affecting Rehab Potential  None    PT Frequency  One time visit    PT Treatment/Interventions  ADLs/Self Care Home Management;Therapeutic exercise;Patient/family education    PT Next Visit Plan  Will reassess 3-4 weeks post op to determine needs  PT Home Exercise Plan  Post op shoulder ROM HEP    Consulted and Agree with Plan of Care  Patient      Patient will follow up at outpatient cancer rehab 3-4 weeks following surgery.  If the patient requires physical therapy at that time, a specific plan will be dictated and sent to the referring physician for approval. The patient was educated today on appropriate basic range of motion exercises to begin post operatively and the importance of attending the After Breast Cancer class following surgery.  Patient was educated today on lymphedema risk reduction practices as it pertains to recommendations that will benefit the patient immediately following surgery.  She verbalized good understanding.     Patient will benefit from skilled therapeutic intervention in order to improve the following deficits and impairments:  Pain, Impaired UE functional use, Postural dysfunction, Decreased knowledge of precautions, Decreased range of motion  Visit Diagnosis: Malignant neoplasm of lower-inner quadrant of right breast of female, estrogen receptor positive ([place]) - Plan: PT plan of care cert/re-cert  Abnormal  posture - Plan: PT plan of care cert/re-cert     Problem List Patient Active Problem List   Diagnosis Date Noted  . Malignant neoplasm of upper-inner quadrant of right breast in female, estrogen receptor positive ([place]) 09/20/2018  . B12 deficiency 04/13/2018  . Joint pain 04/11/2018  . Skin pain 04/11/2018  . Paresthesia 04/11/2018  . Breast pain, left 02/24/2018  . Rash 09/24/2017  . Colon cancer screening 09/16/2017  . Elevated troponin 06/02/2017  . Ear pain, bilateral 05/24/2017  . Prediabetes 07/28/2016  . Allergic rhinitis 11/13/2014  . Adenopathy, cervical 10/30/2014  . Screening for lipoid disorders 09/26/2014  . Preventative health care 09/26/2014  . Routine general medical examination at a health care facility 10/28/2012  . Herpes labialis 04/28/2011  . GERD 09/16/2010  . Hypothyroidism 05/07/2008  . Osteopenia 05/07/2008  . Migraine with aura 11/11/2007    Annia Friendly, PT 09/21/18 4:08 PM   [place], [place], [address] Phone: [phone]   Fax:  [phone]  Name: Jennifer [name] MRN: [id number] Date of Birth: 06/29/1956

## 2018-09-21 NOTE — Progress Notes (Signed)
Radiation Oncology         [phone] ________________________________  Name: Jennifer [name]        MRN: [id number]  Date of Service: 09/21/2018 DOB: Jun 15, 1956  CC:Tower, [name], MD  Jovita Kussmaul, MD     REFERRING PHYSICIAN: [name], MD   DIAGNOSIS: The encounter diagnosis was Malignant neoplasm of upper-inner quadrant of right breast in female, estrogen receptor positive ([place]).   HISTORY OF PRESENT ILLNESS: Jennifer [name] is a 62 y.o. female seen in the multidisciplinary breast clinic for a new diagnosis of right breast cancer. She had left breast pain and in April bilateral diagnostic mammography revealed no visible mass in the left breast, and some calcifications. Her diagnostic mammogram more recently revealed a mass and distortion, and further imaging revealed a mass at 2:00 in the lower inner quadrant of the right breast revealed a 1.7 x .8 x .7 cm and at 1:00 there was a mass measuring 1.2 x .9 x .6 cm, and her axilla was negative for adenopathy. A biopsy of the 2:00 position was significant for a grade 1 invasive ductal carcinoma with DCIS, ER/PR positive, HER2 negative with a Ki67 of 10%. Her 1:00 site was also biopsied and revealed benign fibrocystic changes. She comes today to discuss options of treatment for her cancer.   PREVIOUS RADIATION THERAPY: No  PAST MEDICAL HISTORY:  Past Medical History:  Diagnosis Date  . Allergic rhinitis   . Cervical dysplasia   . Endometriosis   . Esophageal reflux   . Hypothyroid   . Migraine with aura, without mention of intractable migraine without mention of status migrainosus   . Osteopenia 11/2016   T score -2.2 FRAX 9.9%/1.6%       PAST SURGICAL HISTORY: Past Surgical History:  Procedure Laterality Date  . BREAST EXCISIONAL BIOPSY Bilateral over 10 years ago   benign x 2   . BREAST SURGERY     Benign breast lump excised  . CERVICAL CONE BIOPSY  1985   severe dysplasia  . COLONOSCOPY  2005   normal  . COLPOSCOPY    .  ROTATOR CUFF REPAIR  08/2008     FAMILY HISTORY:  Family History  Problem Relation Age of Onset  . Breast cancer Mother 58  . Diabetes Mother   . Hypertension Father   . Heart disease Father   . Osteoporosis Maternal Grandmother   . Diabetes Maternal Grandmother   . Hypertension Sister      SOCIAL HISTORY:  reports that she has never smoked. She has never used smokeless tobacco. She reports that she does not drink alcohol or use drugs. The patient is widowed and resides in [place]. 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   ALLERGIES: Ciprofloxacin; Codeine phosphate; Decongestant [pseudoephedrine]; Flonase [fluticasone propionate]; Hydrocodone; Topamax [topiramate]; Benadryl [diphenhydramine hcl]; and Sulfa antibiotics   MEDICATIONS:  Current Outpatient Medications  Medication Sig Dispense Refill  . ALPRAZolam (XANAX) 0.5 MG tablet TAKE 1/2 TO 1 TABLET BY MOUTH ONCE DAILY AS NEEDED FOR SLEEP/ANXIETY 15 tablet 0  . atenolol (TENORMIN) 25 MG tablet Take 25 mg by mouth daily.     . baclofen (LIORESAL) 10 MG tablet Take 10 mg by mouth as needed. For migraine prevention     . Calcium Carbonate-Vitamin D 600-400 MG-UNIT tablet Take 2 tablets by mouth daily. Totaling 1200 mg    . cyanocobalamin 2000 MCG tablet Take 2,000 mcg by mouth daily.    . famotidine (PEPCID) 40 MG tablet Take 40  mg by mouth daily.    . hydrOXYzine (ATARAX/VISTARIL) 25 MG tablet Take 1 tablet (25 mg total) by mouth 3 (three) times daily as needed. 30 tablet 0  . ketorolac (TORADOL) 10 MG tablet Take 1 tablet (10 mg total) by mouth every 6 (six) hours as needed. 20 tablet 0  . levocetirizine (XYZAL) 5 MG tablet Take 1 tablet by mouth every evening.  3  . levothyroxine (SYNTHROID, LEVOTHROID) 75 MCG tablet Take 1 tablet (75 mcg total) by mouth daily. 30 tablet 11  . Misc Natural Products (GLUCOSAMINE CHOND COMPLEX/MSM) TABS Take 1 tablet by mouth.    . Multiple Vitamin (MULTIVITAMIN WITH MINERALS) TABS tablet Take 1 tablet  by mouth daily.    . nabumetone (RELAFEN) 500 MG tablet Take 500 mg by mouth daily as needed.     . norethindrone-ethinyl estradiol (JINTELI) 1-5 MG-MCG TABS tablet Take 1 tablet by mouth daily. 84 tablet 4  . Omega-3 Fatty Acids (FISH OIL) 1000 MG CAPS Take by mouth.    . OnabotulinumtoxinA (BOTOX IJ) Inject as directed.    . pantoprazole (PROTONIX) 40 MG tablet TAKE 1 TABLET (40 MG TOTAL) BY MOUTH DAILY. 30 tablet 11  . Probiotic Product (PROBIOTIC-10 PO) Take 1 capsule by mouth.    . promethazine (PHENERGAN) 25 MG tablet Take 25 mg by mouth as needed. When having a migraine    . rizatriptan (MAXALT-MLT) 10 MG disintegrating tablet   1  . TURMERIC PO Take 1 tablet by mouth.    . valACYclovir (VALTREX) 1000 MG tablet Take 2 tablets (2,000 mg total) by mouth 2 (two) times daily. For one day for a cold sore 16 tablet 1   No current facility-administered medications for this encounter.      REVIEW OF SYSTEMS: On review of systems, the patient reports that she is doing well overall. She denies any chest pain, shortness of breath, cough, fevers, chills, night sweats, unintended weight changes. She denies any bowel or bladder disturbances, and denies abdominal pain, nausea or vomiting. She denies any new musculoskeletal or joint aches or pains. A complete review of systems is obtained and is otherwise negative.     PHYSICAL EXAM:  Wt Readings from Last 3 Encounters:  09/21/18 126 lb 9.6 oz (57.4 kg)  08/05/18 122 lb 12 oz (55.7 kg)  01/12/18 125 lb (56.7 kg)   Temp Readings from Last 3 Encounters:  09/21/18 98.4 F (36.9 C) (Oral)  08/05/18 98.1 F (36.7 C) (Oral)  01/12/18 99 F (37.2 C) (Oral)   BP Readings from Last 3 Encounters:  09/21/18 130/72  08/05/18 116/70  07/11/18 118/78   Pulse Readings from Last 3 Encounters:  09/21/18 70  08/05/18 68  01/12/18 75     In general this is a well appearing hispanic female in no acute distress. She is alert and oriented x4 and  appropriate throughout the examination. Cardiopulmonary assessment is negative for acute distress and she exhibits normal effort. Breast exam is deferred.  ECOG = 0           0 - Asymptomatic (Fully active, able to carry on all predisease activities without restriction)  1 - Symptomatic but completely ambulatory (Restricted in physically strenuous activity but ambulatory and able to carry out work of a light or sedentary nature. For example, light housework, office work)  2 - Symptomatic, <50% in bed during the day (Ambulatory and capable of all self care but unable to carry out any work activities. Up and about  more than 50% of waking hours)  3 - Symptomatic, >50% in bed, but not bedbound (Capable of only limited self-care, confined to bed or chair 50% or more of waking hours)  4 - Bedbound (Completely disabled. Cannot carry on any self-care. Totally confined to bed or chair)  5 - Death   Eustace Pen MM, Creech RH, Tormey DC, et al. [phone]). "Toxicity and response criteria of the [facility]". [place] Oncol. 5 (6): 649-55    LABORATORY DATA:  Lab Results  Component Value Date   WBC 14.1 (H) 09/21/2018   HGB 12.2 09/21/2018   HCT 36.7 09/21/2018   MCV 86.8 09/21/2018   PLT 300 09/21/2018   Lab Results  Component Value Date   NA 135 09/21/2018   K 4.5 09/21/2018   CL 99 09/21/2018   CO2 29 09/21/2018   Lab Results  Component Value Date   ALT 32 09/21/2018   AST 22 09/21/2018   ALKPHOS 78 09/21/2018   BILITOT 0.6 09/21/2018      RADIOGRAPHY: US Breast Ltd Uni Right Inc Axilla  Result Date: 09/06/2018 CLINICAL DATA:  Follow-up for probably benign dystrophic type calcifications in the right breast. EXAM: DIGITAL DIAGNOSTIC UNILATERAL RIGHT MAMMOGRAM WITH CAD AND TOMO RIGHT BREAST ULTRASOUND COMPARISON:  Previous exam(s). ACR Breast Density Category d: The breast tissue is extremely dense, which lowers the sensitivity of mammography. FINDINGS: There is a  mass with associated distortion in the far inner right breast. This is confirmed on the spot compression CC tomograms and not definitely seen on the spot compression MLO tomograms. There is a surgical scar in the lower central to slightly outer right breast and the initially seen distortion on the MLO view appears to correspond with postsurgical change. The benign appearing dystrophic calcifications in the upper right breast are stable. Mammographic images were processed with CAD. Physical examination of the upper slightly inner right breast reveals an area of thickening at the approximate 2 o'clock position. Targeted ultrasound of the right breast was performed demonstrating an irregular hypoechoic mass at 2 o'clock 9 cm from nipple measuring 1.7 x 0.8 x 0.7 cm. This corresponds well with mammography findings. There is an oval circumscribed hypoechoic mass in the right breast at 1 o'clock 5 cm from the nipple with calcifications measuring 0.9 x 0.6 x 1.2 cm, likely corresponding with the probably benign calcifications at mammography and representing a fibroadenoma. No lymphadenopathy seen in the right axilla. IMPRESSION: 1. Highly suspicious mass in the right breast at the 2 o'clock position. 2. Right breast mass at the 1 o'clock position, possibly a fibroadenoma however given the additional suspicious mass in the right breast biopsy of this is warranted to proven benignity. RECOMMENDATION: 1. Recommend ultrasound-guided biopsy of the irregular mass in the right breast at the 2 o'clock position. 2. Recommend ultrasound-guided biopsy of the mass possibly representing a fibroadenoma in the right breast at the 1 o'clock position. I have discussed the findings and recommendations with the patient. Results were also provided in writing at the conclusion of the visit. If applicable, a reminder letter will be sent to the patient regarding the next appointment. BI-RADS CATEGORY  5: Highly suggestive of malignancy.  Electronically Signed   By: Everlean Alstrom M.D.   On: 09/06/2018 11:57   Mm Diag Breast Tomo Uni Right  Result Date: 09/06/2018 CLINICAL DATA:  Follow-up for probably benign dystrophic type calcifications in the right breast. EXAM: DIGITAL DIAGNOSTIC UNILATERAL RIGHT MAMMOGRAM WITH CAD AND TOMO  RIGHT BREAST ULTRASOUND COMPARISON:  Previous exam(s). ACR Breast Density Category d: The breast tissue is extremely dense, which lowers the sensitivity of mammography. FINDINGS: There is a mass with associated distortion in the far inner right breast. This is confirmed on the spot compression CC tomograms and not definitely seen on the spot compression MLO tomograms. There is a surgical scar in the lower central to slightly outer right breast and the initially seen distortion on the MLO view appears to correspond with postsurgical change. The benign appearing dystrophic calcifications in the upper right breast are stable. Mammographic images were processed with CAD. Physical examination of the upper slightly inner right breast reveals an area of thickening at the approximate 2 o'clock position. Targeted ultrasound of the right breast was performed demonstrating an irregular hypoechoic mass at 2 o'clock 9 cm from nipple measuring 1.7 x 0.8 x 0.7 cm. This corresponds well with mammography findings. There is an oval circumscribed hypoechoic mass in the right breast at 1 o'clock 5 cm from the nipple with calcifications measuring 0.9 x 0.6 x 1.2 cm, likely corresponding with the probably benign calcifications at mammography and representing a fibroadenoma. No lymphadenopathy seen in the right axilla. IMPRESSION: 1. Highly suspicious mass in the right breast at the 2 o'clock position. 2. Right breast mass at the 1 o'clock position, possibly a fibroadenoma however given the additional suspicious mass in the right breast biopsy of this is warranted to proven benignity. RECOMMENDATION: 1. Recommend ultrasound-guided biopsy of  the irregular mass in the right breast at the 2 o'clock position. 2. Recommend ultrasound-guided biopsy of the mass possibly representing a fibroadenoma in the right breast at the 1 o'clock position. I have discussed the findings and recommendations with the patient. Results were also provided in writing at the conclusion of the visit. If applicable, a reminder letter will be sent to the patient regarding the next appointment. BI-RADS CATEGORY  5: Highly suggestive of malignancy. Electronically Signed   By: Everlean Alstrom M.D.   On: 09/06/2018 11:57   Mm Clip Placement Right  Result Date: 09/13/2018 CLINICAL DATA:  Patient status post ultrasound-guided core needle biopsy 2 right breast masses. EXAM: DIAGNOSTIC RIGHT MAMMOGRAM POST ULTRASOUND BIOPSY COMPARISON:  Previous exam(s). FINDINGS: Mammographic images were obtained following ultrasound guided biopsy of two right breast masses. Site 1: Ribbon shaped marking clip, demonstrated on the CC view only, right breast mass 2 o'clock position. Site 2: Coil shaped marking clip, demonstrated on the MLO view only, right breast mass 1 o'clock position. IMPRESSION: Appropriate position biopsy marking clips as above. Note given the posterior location of these lesions, the clips were only able to be visualized on a single view. Final Assessment: Post Procedure Mammograms for Marker Placement Electronically Signed   By: Lovey Newcomer M.D.   On: 09/13/2018 14:25   [place] Rt Breast Bx W Loc Dev 1st Lesion Img Bx Spec US Guide  Addendum Date: 09/15/2018   ADDENDUM REPORT: 09/14/2018 14:17 ADDENDUM: Pathology revealed GRADE I INVASIVE DUCTAL CARCINOMA, DUCTAL CARCINOMA IN SITU of the Right breast, 2 o'clock, 9 cm fn. This was found to be concordant by Dr. Lovey Newcomer. Pathology revealed FIBROCYSTIC CHANGES of the Right breast, 1 o'clock, 5 cm fn. This was found to be concordant by Dr. Lovey Newcomer. Pathology results were discussed with the patient by telephone. The patient  reported doing well after the biopsies with tenderness at the sites. Post biopsy instructions and care were reviewed and questions were answered. The patient was encouraged  to call [facility] for any additional concerns. The patient was referred to [facility] at [facility] on September 21, 2018. Pathology results reported by Terie Purser, RN on 09/14/2018. Electronically Signed   By: Lovey Newcomer M.D.   On: 09/14/2018 14:17   Result Date: 09/15/2018 CLINICAL DATA:  Indeterminate right breast mass 2 o'clock position. EXAM: ULTRASOUND GUIDED RIGHT BREAST CORE NEEDLE BIOPSY COMPARISON:  Previous exam(s). FINDINGS: I met with the patient and we discussed the procedure of ultrasound-guided biopsy, including benefits and alternatives. We discussed the high likelihood of a successful procedure. We discussed the risks of the procedure, including infection, bleeding, tissue injury, clip migration, and inadequate sampling. Informed written consent was given. The usual time-out protocol was performed immediately prior to the procedure. Lesion quadrant: Upper inner quadrant Using sterile technique and 1% Lidocaine as local anesthetic, under direct ultrasound visualization, a 12 gauge spring-loaded device was used to perform biopsy of right breast mass 2 o'clock position using a lateral approach. At the conclusion of the procedure a ribbon shaped tissue marker clip was deployed into the biopsy cavity. Follow up 2 view mammogram was performed and dictated separately. IMPRESSION: Ultrasound guided biopsy of right breast mass 2 o'clock position. No apparent complications. Electronically Signed: By: Lovey Newcomer M.D. On: 09/13/2018 14:26   [place] Rt Breast Bx W Loc Dev Ea Add Lesion Img Bx Spec US Guide  Addendum Date: 09/15/2018   ADDENDUM REPORT: 09/14/2018 14:17 ADDENDUM: Pathology revealed GRADE I INVASIVE DUCTAL CARCINOMA, DUCTAL  CARCINOMA IN SITU of the Right breast, 2 o'clock, 9 cm fn. This was found to be concordant by Dr. Lovey Newcomer. Pathology revealed FIBROCYSTIC CHANGES of the Right breast, 1 o'clock, 5 cm fn. This was found to be concordant by Dr. Lovey Newcomer. Pathology results were discussed with the patient by telephone. The patient reported doing well after the biopsies with tenderness at the sites. Post biopsy instructions and care were reviewed and questions were answered. The patient was encouraged to call [facility] for any additional concerns. The patient was referred to [facility] at [facility] on September 21, 2018. Pathology results reported by Terie Purser, RN on 09/14/2018. Electronically Signed   By: Lovey Newcomer M.D.   On: 09/14/2018 14:17   Result Date: 09/15/2018 CLINICAL DATA:  Patient with indeterminate right breast mass. EXAM: ULTRASOUND GUIDED RIGHT BREAST CORE NEEDLE BIOPSY COMPARISON:  Previous exam(s). FINDINGS: I met with the patient and we discussed the procedure of ultrasound-guided biopsy, including benefits and alternatives. We discussed the high likelihood of a successful procedure. We discussed the risks of the procedure, including infection, bleeding, tissue injury, clip migration, and inadequate sampling. Informed written consent was given. The usual time-out protocol was performed immediately prior to the procedure. Lesion quadrant: Upper inner quadrant Using sterile technique and 1% Lidocaine as local anesthetic, under direct ultrasound visualization, a 12 gauge spring-loaded device was used to perform biopsy of right breast mass 1 o'clock position using a lateral approach. At the conclusion of the procedure a coil shaped tissue marker clip was deployed into the biopsy cavity. Follow up 2 view mammogram was performed and dictated separately. IMPRESSION: Ultrasound guided biopsy of right breast mass 1 o'clock  position. No apparent complications. Electronically Signed: By: Lovey Newcomer M.D. On: 09/13/2018 14:26       IMPRESSION/PLAN: 1. Stage [id number], grade 1 ER/PR  positive invasive ductal carcinoma of the right breast. Dr. Lisbeth Renshaw discusses the pathology findings and reviews the nature of invasive breast disease. The consensus from the breast conference includes breast conservation with lumpectomy with sentinel node biopsy. Her tumor will be tested for Oncotype Dx score to determine a role for systemic therapy. Provided that chemotherapy is not indicated, the patient's course would then be followed by external radiotherapy to the breast followed by antiestrogen therapy. We discussed the risks, benefits, short, and long term effects of radiotherapy, and the patient is interested in proceeding. Dr. Lisbeth Renshaw discusses the delivery and logistics of radiotherapy and anticipates a course of 4 weeks of radiotherapy. We will see her back about 2 weeks after surgery to discuss the simulation process and anticipate we starting radiotherapy about 4-6 weeks after surgery.   In a visit lasting 30 minutes, greater than 50% of the time was spent face to face discussing her case, and coordinating the patient's care.    The above documentation reflects my direct findings during this shared patient visit. Please see the separate note by Dr. Lisbeth Renshaw on this date for the remainder of the patient's plan of care.    Carola Rhine, PAC

## 2018-09-21 NOTE — Progress Notes (Signed)
Mri b

## 2018-09-21 NOTE — Progress Notes (Signed)
[facility] CONSULT NOTE  Patient Care Team: Tower, [name], MD as PCP - General Nicholas Lose, MD as Consulting Physician (Hematology and Oncology) Kyung Rudd, MD as Consulting Physician (Radiation Oncology) Jovita Kussmaul, MD as Consulting Physician (General Surgery)  CHIEF COMPLAINTS/PURPOSE OF CONSULTATION:  Newly diagnosed breast cancer  HISTORY OF PRESENTING ILLNESS:  Jennifer [name] 62 y.o. female is here because of recent diagnosis of right breast cancer.  Patient had a diagnostic mammogram that detected a distortion and right breast mass in the lower inner quadrant 9 cm from nipple at 2 o'clock position.  It was a 1.7 cm tumor.  There was an additional at 1 o'clock position 1.2 cm area of abnormality which on biopsy was fibrocystic change.  Biopsy of the 2 o'clock position mass came back as invasive ductal carcinoma grade 1 with DCIS that was ER PR positive HER-2 negative with a Ki-67 of 10%.  She was presented this morning at the multidisciplinary clinic and she is here by herself to discuss her treatment plan.  She tells me that she has a huge family waiting to help her but she wanted to do this by herself today.  I reviewed her records extensively and collaborated the history with the patient.  SUMMARY OF ONCOLOGIC HISTORY:   Malignant neoplasm of upper-inner quadrant of right breast in female, estrogen receptor positive ([place])   09/13/2018 Initial Diagnosis    Diagnostic mammogram detected right breast mass with distortion LIQ at 2:00 9 cm from nipple 1.7 cm, at 1:00 there was a benign 1.2 cm fibrocystic change, biopsy of the 2:00 mass revealed grade 1 IDC with DCIS ER 100%, PR 90%, Ki-67 10%, HER-2 1+ negative, T1CN0 stage Ia clinical stage      MEDICAL HISTORY:  Past Medical History:  Diagnosis Date  . Allergic rhinitis   . Cervical dysplasia   . Endometriosis   . Esophageal reflux   . Hypothyroid   . Migraine with aura, without mention of intractable  migraine without mention of status migrainosus   . Osteopenia 11/2016   T score -2.2 FRAX 9.9%/1.6%    SURGICAL HISTORY: Past Surgical History:  Procedure Laterality Date  . BREAST EXCISIONAL BIOPSY Bilateral over 10 years ago   benign x 2   . BREAST SURGERY     Benign breast lump excised  . CERVICAL CONE BIOPSY  1985   severe dysplasia  . COLONOSCOPY  2005   normal  . COLPOSCOPY    . ROTATOR CUFF REPAIR  08/2008    SOCIAL HISTORY: Social History   Socioeconomic History  . Marital status: Widowed    Spouse name: Not on file  . Number of children: Not on file  . Years of education: Not on file  . Highest education level: Not on file  Occupational History  . 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 Financial resource strain: Not on file  . Food insecurity:    Worry: Not on file    Inability: Not on file  . Transportation needs:    Medical: Not on file    Non-medical: Not on file  Tobacco Use  . Smoking status: Never Smoker  . Smokeless tobacco: Never Used  Substance and Sexual Activity  . Alcohol use: No    Alcohol/week: 0.0 standard drinks  . Drug use: No  . Sexual activity: Not Currently    Birth control/protection: Post-menopausal    Comment: 1st intercourse 25 yo-5 partners  Lifestyle  . Physical activity:  Days per week: Not on file    Minutes per session: Not on file  . Stress: Not on file  Relationships  . Social connections:    Talks on phone: Not on file    Gets together: Not on file    Attends religious service: Not on file    Active member of club or organization: Not on file    Attends meetings of clubs or organizations: Not on file    Relationship status: Not on file  . Intimate partner violence:    Fear of current or ex partner: Not on file    Emotionally abused: Not on file    Physically abused: Not on file    Forced sexual activity: Not on file  Other Topics Concern  . 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    FAMILY HISTORY: Family History  Problem Relation Age of Onset  . Breast cancer Mother 60  . Diabetes Mother   . Hypertension Father   . Heart disease Father   . Osteoporosis Maternal Grandmother   . Diabetes Maternal Grandmother   . Hypertension Sister     ALLERGIES:  is allergic to ciprofloxacin; codeine phosphate; decongestant [pseudoephedrine]; flonase [fluticasone propionate]; hydrocodone; topamax [topiramate]; benadryl [diphenhydramine hcl]; and sulfa antibiotics.  MEDICATIONS:  Current Outpatient Medications  Medication Sig Dispense Refill  . ALPRAZolam (XANAX) 0.5 MG tablet TAKE 1/2 TO 1 TABLET BY MOUTH ONCE DAILY AS NEEDED FOR SLEEP/ANXIETY 15 tablet 0  . atenolol (TENORMIN) 25 MG tablet Take 25 mg by mouth daily.     . baclofen (LIORESAL) 10 MG tablet Take 10 mg by mouth as needed. For migraine prevention     . Calcium Carbonate-Vitamin D 600-400 MG-UNIT tablet Take 2 tablets by mouth daily. Totaling 1200 mg    . cyanocobalamin 2000 MCG tablet Take 2,000 mcg by mouth daily.    . famotidine (PEPCID) 40 MG tablet Take 40 mg by mouth daily.    . hydrOXYzine (ATARAX/VISTARIL) 25 MG tablet Take 1 tablet (25 mg total) by mouth 3 (three) times daily as needed. 30 tablet 0  . ketorolac (TORADOL) 10 MG tablet Take 1 tablet (10 mg total) by mouth every 6 (six) hours as needed. 20 tablet 0  . levocetirizine (XYZAL) 5 MG tablet Take 1 tablet by mouth every evening.  3  . levothyroxine (SYNTHROID, LEVOTHROID) 75 MCG tablet Take 1 tablet (75 mcg total) by mouth daily. 30 tablet 11  . Misc Natural Products (GLUCOSAMINE CHOND COMPLEX/MSM) TABS Take 1 tablet by mouth.    . Multiple Vitamin (MULTIVITAMIN WITH MINERALS) TABS tablet Take 1 tablet by mouth daily.    . nabumetone (RELAFEN) 500 MG tablet Take 500 mg by mouth daily as needed.     . norethindrone-ethinyl estradiol (JINTELI) 1-5 MG-MCG TABS tablet Take 1  tablet by mouth daily. 84 tablet 4  . Omega-3 Fatty Acids (FISH OIL) 1000 MG CAPS Take by mouth.    . OnabotulinumtoxinA (BOTOX IJ) Inject as directed.    . pantoprazole (PROTONIX) 40 MG tablet TAKE 1 TABLET (40 MG TOTAL) BY MOUTH DAILY. 30 tablet 11  . Probiotic Product (PROBIOTIC-10 PO) Take 1 capsule by mouth.    . promethazine (PHENERGAN) 25 MG tablet Take 25 mg by mouth as needed. When having a migraine    . rizatriptan (MAXALT-MLT) 10 MG disintegrating tablet   1  . TURMERIC PO Take  1 tablet by mouth.    . valACYclovir (VALTREX) 1000 MG tablet Take 2 tablets (2,000 mg total) by mouth 2 (two) times daily. For one day for a cold sore 16 tablet 1   No current facility-administered medications for this visit.     REVIEW OF SYSTEMS:   Constitutional: Denies fevers, chills or abnormal night sweats Eyes: Denies blurriness of vision, double vision or watery eyes Ears, nose, mouth, throat, and face: Denies mucositis or sore throat Respiratory: Denies cough, dyspnea or wheezes Cardiovascular: Denies palpitation, chest discomfort or lower extremity swelling Gastrointestinal:  Denies nausea, heartburn or change in bowel habits Skin: Denies abnormal skin rashes Lymphatics: Denies new lymphadenopathy or easy bruising Neurological:Denies numbness, tingling or new weaknesses Behavioral/Psych: Mood is stable, no new changes  Breast:  Denies any palpable lumps or discharge All other systems were reviewed with the patient and are negative.  PHYSICAL EXAMINATION: ECOG PERFORMANCE STATUS: 1 - Symptomatic but completely ambulatory  Vitals:   09/21/18 1309  BP: 130/72  Pulse: 70  Resp: 18  Temp: 98.4 F (36.9 C)  SpO2: 99%   Filed Weights   09/21/18 1309  Weight: 126 lb 9.6 oz (57.4 kg)    GENERAL:alert, no distress and comfortable SKIN: skin color, texture, turgor are normal, no rashes or significant lesions EYES: normal, conjunctiva are pink and non-injected, sclera  clear OROPHARYNX:no exudate, no erythema and lips, buccal mucosa, and tongue normal  NECK: supple, thyroid normal size, non-tender, without nodularity LYMPH:  no palpable lymphadenopathy in the cervical, axillary or inguinal LUNGS: clear to auscultation and percussion with normal breathing effort HEART: regular rate & rhythm and no murmurs and no lower extremity edema ABDOMEN:abdomen soft, non-tender and normal bowel sounds Musculoskeletal:no cyanosis of digits and no clubbing  PSYCH: alert & oriented x 3 with fluent speech NEURO: no focal motor/sensory deficits  LABORATORY DATA:  I have reviewed the data as listed Lab Results  Component Value Date   WBC 14.1 (H) 09/21/2018   HGB 12.2 09/21/2018   HCT 36.7 09/21/2018   MCV 86.8 09/21/2018   PLT 300 09/21/2018   Lab Results  Component Value Date   NA 135 09/21/2018   K 4.5 09/21/2018   CL 99 09/21/2018   CO2 29 09/21/2018    RADIOGRAPHIC STUDIES: I have personally reviewed the radiological reports and agreed with the findings in the report.  ASSESSMENT AND PLAN:  Malignant neoplasm of upper-inner quadrant of right breast in female, estrogen receptor positive ([place]) 09/13/2018:Diagnostic mammogram detected right breast mass with distortion LIQ at 2:00 9 cm from nipple 1.7 cm, at 1:00 there was a benign 1.2 cm fibrocystic change, biopsy of the 2:00 mass revealed grade 1 IDC with DCIS ER 100%, PR 90%, Ki-67 10%, HER-2 1+ negative, T1CN0 stage Ia clinical stage  Pathology and radiology counseling:Discussed with the patient, the details of pathology including the type of breast cancer,the clinical staging, the significance of ER, PR and HER-2/neu receptors and the implications for treatment. After reviewing the pathology in detail, we proceeded to discuss the different treatment options between surgery, radiation, chemotherapy, antiestrogen therapies.  Recommendations: 1. Breast conserving surgery followed by 2. Oncotype DX testing  to determine if chemotherapy would be of any benefit followed by 3. Adjuvant radiation therapy followed by 4. Adjuvant antiestrogen therapy  Oncotype counseling: I discussed Oncotype DX test. I explained to the patient that this is a 21 gene panel to evaluate patient tumors DNA to calculate recurrence score. This would help determine whether  patient has high risk or intermediate risk or low risk breast cancer. She understands that if her tumor was found to be high risk, she would benefit from systemic chemotherapy. If low risk, no need of chemotherapy. If she was found to be intermediate risk, we would need to evaluate the score as well as other risk factors and determine if an abbreviated chemotherapy may be of benefit.  Return to clinic after surgery to discuss final pathology report and then determine if Oncotype DX testing will need to be sent.     All questions were answered. The patient knows to call the clinic with any problems, questions or concerns.    Harriette Ohara, MD 09/21/18

## 2018-09-21 NOTE — Patient Instructions (Signed)

## 2018-09-22 ENCOUNTER — Telehealth: Payer: Self-pay | Admitting: *Deleted

## 2018-09-22 ENCOUNTER — Other Ambulatory Visit: Payer: Self-pay

## 2018-09-22 ENCOUNTER — Encounter: Payer: Self-pay | Admitting: Emergency Medicine

## 2018-09-22 ENCOUNTER — Emergency Department
Admission: EM | Admit: 2018-09-22 | Discharge: 2018-09-22 | Disposition: A | Discharge status: Home / Self Care | Payer: BLUE CROSS/BLUE SHIELD | Attending: Emergency Medicine | Admitting: Emergency Medicine

## 2018-09-22 DIAGNOSIS — Z170 Estrogen receptor positive status [ER+]: Secondary | ICD-10-CM | POA: Diagnosis not present

## 2018-09-22 DIAGNOSIS — E039 Hypothyroidism, unspecified: Secondary | ICD-10-CM | POA: Insufficient documentation

## 2018-09-22 DIAGNOSIS — Z79899 Other long term (current) drug therapy: Secondary | ICD-10-CM | POA: Insufficient documentation

## 2018-09-22 DIAGNOSIS — G43009 Migraine without aura, not intractable, without status migrainosus: Secondary | ICD-10-CM | POA: Diagnosis not present

## 2018-09-22 DIAGNOSIS — C50211 Malignant neoplasm of upper-inner quadrant of right female breast: Secondary | ICD-10-CM | POA: Diagnosis not present

## 2018-09-22 MED ORDER — KETOROLAC TROMETHAMINE 30 MG/ML IJ SOLN
30.0000 mg | Freq: Once | INTRAMUSCULAR | Status: AC
  Administered 2018-09-22: 30 mg via INTRAVENOUS
  Filled 2018-09-22: qty 1

## 2018-09-22 MED ORDER — ONDANSETRON HCL 4 MG/2ML IJ SOLN
4.0000 mg | Freq: Once | INTRAMUSCULAR | Status: AC
  Administered 2018-09-22: 4 mg via INTRAVENOUS
  Filled 2018-09-22: qty 2

## 2018-09-22 MED ORDER — SODIUM CHLORIDE 0.9 % IV SOLN
1000.0000 mL | Freq: Once | INTRAVENOUS | Status: AC
  Administered 2018-09-22: 1000 mL via INTRAVENOUS

## 2018-09-22 NOTE — Telephone Encounter (Signed)
Left vm for pt regarding upcoming testing she is scheduled for in Dec. Informed pt may proceed with colonoscopy/endoscopy as scheduled. Request return call for further questions or needs. Contact information provided.

## 2018-09-22 NOTE — ED Triage Notes (Signed)
Patient reports migraine for 5 days with no relief from home medications. Reports she went to PCP for steroid shot yesterday with no improvement. Patient complaining of photophobia and nausea. History of migraines.
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
## 2018-09-23 ENCOUNTER — Emergency Department (HOSPITAL_COMMUNITY)
Admission: EM | Admit: 2018-09-23 | Discharge: 2018-09-24 | Disposition: A | Discharge status: Home / Self Care | Payer: BLUE CROSS/BLUE SHIELD | Attending: Emergency Medicine | Admitting: Emergency Medicine

## 2018-09-23 ENCOUNTER — Encounter (HOSPITAL_COMMUNITY): Payer: Self-pay | Admitting: Emergency Medicine

## 2018-09-23 ENCOUNTER — Emergency Department (HOSPITAL_COMMUNITY): Payer: BLUE CROSS/BLUE SHIELD

## 2018-09-23 ENCOUNTER — Other Ambulatory Visit: Payer: Self-pay

## 2018-09-23 ENCOUNTER — Ambulatory Visit: Payer: Self-pay | Admitting: General Surgery

## 2018-09-23 DIAGNOSIS — R51 Headache: Secondary | ICD-10-CM | POA: Diagnosis not present

## 2018-09-23 DIAGNOSIS — R079 Chest pain, unspecified: Secondary | ICD-10-CM | POA: Diagnosis not present

## 2018-09-23 DIAGNOSIS — C801 Malignant (primary) neoplasm, unspecified: Secondary | ICD-10-CM

## 2018-09-23 DIAGNOSIS — C50211 Malignant neoplasm of upper-inner quadrant of right female breast: Secondary | ICD-10-CM | POA: Insufficient documentation

## 2018-09-23 DIAGNOSIS — E039 Hypothyroidism, unspecified: Secondary | ICD-10-CM | POA: Insufficient documentation

## 2018-09-23 DIAGNOSIS — Z170 Estrogen receptor positive status [ER+]: Secondary | ICD-10-CM

## 2018-09-23 DIAGNOSIS — C50311 Malignant neoplasm of lower-inner quadrant of right female breast: Secondary | ICD-10-CM

## 2018-09-23 DIAGNOSIS — S20219A Contusion of unspecified front wall of thorax, initial encounter: Secondary | ICD-10-CM | POA: Diagnosis not present

## 2018-09-23 DIAGNOSIS — R519 Headache, unspecified: Secondary | ICD-10-CM

## 2018-09-23 DIAGNOSIS — S299XXA Unspecified injury of thorax, initial encounter: Secondary | ICD-10-CM | POA: Diagnosis not present

## 2018-09-23 HISTORY — DX: Malignant (primary) neoplasm, unspecified: C80.1

## 2018-09-23 MED ORDER — SODIUM CHLORIDE 0.9 % IV BOLUS
500.0000 mL | Freq: Once | INTRAVENOUS | Status: AC
  Administered 2018-09-23: 500 mL via INTRAVENOUS

## 2018-09-23 MED ORDER — DEXAMETHASONE SODIUM PHOSPHATE 10 MG/ML IJ SOLN
10.0000 mg | Freq: Once | INTRAMUSCULAR | Status: AC
  Administered 2018-09-23: 10 mg via INTRAVENOUS
  Filled 2018-09-23: qty 1

## 2018-09-23 MED ORDER — LORAZEPAM 2 MG/ML IJ SOLN
0.5000 mg | Freq: Once | INTRAMUSCULAR | Status: AC
  Administered 2018-09-24: 0.5 mg via INTRAVENOUS
  Filled 2018-09-23: qty 1

## 2018-09-23 MED ORDER — MAGNESIUM SULFATE 2 GM/50ML IV SOLN
2.0000 g | Freq: Once | INTRAVENOUS | Status: AC
  Administered 2018-09-23: 2 g via INTRAVENOUS
  Filled 2018-09-23: qty 50

## 2018-09-23 MED ORDER — KETOROLAC TROMETHAMINE 30 MG/ML IJ SOLN
30.0000 mg | Freq: Once | INTRAMUSCULAR | Status: AC
  Administered 2018-09-23: 30 mg via INTRAVENOUS
  Filled 2018-09-23: qty 1

## 2018-09-23 MED ORDER — ONDANSETRON HCL 4 MG/2ML IJ SOLN
4.0000 mg | Freq: Once | INTRAMUSCULAR | Status: AC
  Administered 2018-09-23: 4 mg via INTRAVENOUS
  Filled 2018-09-23: qty 2

## 2018-09-23 NOTE — ED Triage Notes (Addendum)
Patient c/o headache with light sensitivity and nausea x1 hour. Hx migraines. Reports recent dx breast cancer. States has not yet started treatments. Seen yesterday for same.

## 2018-09-23 NOTE — ED Notes (Signed)
Blood work sent to lab.

## 2018-09-23 NOTE — Progress Notes (Signed)
[facility] Psychosocial Distress Screening Spiritual Care  Met with Belicia in [facility] to introduce [facility] team/resources, reviewing distress screen per protocol.  The patient scored a 4 on the Psychosocial Distress Thermometer which indicates moderate distress. Also assessed for distress and other psychosocial needs.   ONCBCN DISTRESS SCREENING 09/23/2018  Screening Type Initial Screening  Distress experienced in past week (1-10) 4  Practical problem type Work/school  Emotional problem type Nervousness/Anxiety;Adjusting to illness  Information Concerns Type Lack of info about diagnosis;Lack of info about treatment  Physical Problem type Loss of appetitie  Referral to support programs Yes   The patient reported that she has a history with symptoms of anxiety, and her diagnosis has added to these symptoms. The patient expressed that she will be reaching out to her PCP in the next week to discuss whether psychotropic medication may be helpful. The counselor also extended Patient and [facility] resources, including individual counseling. The patient reported that she is feeling "hopeful" and encouraged by having a treatment plan. She expressed that she would reach out if she decides that counseling or other support programs may be helpful to her.  Follow up needed: No.  Doris Cheadle, Counseling Intern [phone]

## 2018-09-23 NOTE — ED Provider Notes (Signed)
Emergency Department Provider Note   I have reviewed the triage vital signs and the nursing notes.   HISTORY  Chief Complaint Headache   HPI Jennifer [name] is a 62 y.o. female with PMH of migraine HA and breast cancer presents to the emergency department for evaluation of headache.  The patient's headache is consistent with her migraines in the past.  She states she has had more frequent migraine headaches over the past several weeks.  She was seen in the emergency department yesterday at [facility] and treated for migraine at that time.  Her symptoms improved but states she has had multiple stressors including poor sleep and has developed another migraine headache.  She states this feels totally consistent with her prior migraines and in no way new or different.  She denies any weakness or numbness.  She does have photophobia.  No speech changes or difficulty walking.   Past Medical History:  Diagnosis Date  . Allergic rhinitis   . Cancer ([employer])    Breast CA new DX  . Cervical dysplasia   . Endometriosis   . Esophageal reflux   . Hypothyroid   . Migraine with aura, without mention of intractable migraine without mention of status migrainosus   . Osteopenia 11/2016   T score -2.2 FRAX 9.9%/1.6%    Patient Active Problem List   Diagnosis Date Noted  . Malignant neoplasm of upper-inner quadrant of right breast in female, estrogen receptor positive ([place]) 09/20/2018  . B12 deficiency 04/13/2018  . Joint pain 04/11/2018  . Skin pain 04/11/2018  . Paresthesia 04/11/2018  . Breast pain, left 02/24/2018  . Rash 09/24/2017  . Colon cancer screening 09/16/2017  . Elevated troponin 06/02/2017  . Ear pain, bilateral 05/24/2017  . Prediabetes 07/28/2016  . Allergic rhinitis 11/13/2014  . Adenopathy, cervical 10/30/2014  . Screening for lipoid disorders 09/26/2014  . Preventative health care 09/26/2014  . Routine general medical examination at a health care facility 10/28/2012    . Herpes labialis 04/28/2011  . GERD 09/16/2010  . Hypothyroidism 05/07/2008  . Osteopenia 05/07/2008  . Migraine with aura 11/11/2007    Past Surgical History:  Procedure Laterality Date  . BREAST EXCISIONAL BIOPSY Bilateral over 10 years ago   benign x 2   . BREAST SURGERY     Benign breast lump excised  . CERVICAL CONE BIOPSY  1985   severe dysplasia  . COLONOSCOPY  2005   normal  . COLPOSCOPY    . ROTATOR CUFF REPAIR  08/2008    Allergies Ciprofloxacin; Codeine phosphate; Decongestant [pseudoephedrine]; Flonase [fluticasone propionate]; Hydrocodone; Reglan [metoclopramide]; Topamax [topiramate]; Benadryl [diphenhydramine hcl]; and Sulfa antibiotics  Family History  Problem Relation Age of Onset  . Breast cancer Mother 61  . Diabetes Mother   . Hypertension Father   . Heart disease Father   . Osteoporosis Maternal Grandmother   . Diabetes Maternal Grandmother   . Hypertension Sister     Social History Social History   Tobacco Use  . Smoking status: Never Smoker  . Smokeless tobacco: Never Used  Substance Use Topics  . Alcohol use: No    Alcohol/week: 0.0 standard drinks  . Drug use: No    Review of Systems  Constitutional: No fever/chills Eyes: No visual changes. Positive photophobia.  ENT: No sore throat. Cardiovascular: Denies chest pain. Respiratory: Denies shortness of breath. Gastrointestinal: No abdominal pain.  No nausea, no vomiting.  No diarrhea.  No constipation. Genitourinary: Negative for dysuria. Musculoskeletal: Negative for back  pain. Skin: Negative for rash. Neurological: Negative for focal weakness or numbness. Positive HA.   10-point ROS otherwise negative.  ____________________________________________   PHYSICAL EXAM:  VITAL SIGNS: ED Triage Vitals  Enc Vitals Group     BP 09/23/18 2119 127/73     Pulse Rate 09/23/18 2119 67     Resp 09/23/18 2119 18     Temp 09/23/18 2119 98.1 F (36.7 C)     Temp Source 09/23/18 2119  Oral     SpO2 09/23/18 2119 100 %     Pain Score 09/23/18 2124 9   Constitutional: Alert and oriented. Sitting in a dark room with eyes covered.  Eyes: Conjunctivae are normal. Head: Atraumatic. Nose: No congestion/rhinnorhea. Mouth/Throat: Mucous membranes are moist.  Oropharynx non-erythematous. Neck: No stridor.  Cardiovascular: Normal rate, regular rhythm. Good peripheral circulation. Grossly normal heart sounds.   Respiratory: Normal respiratory effort.  No retractions. Lungs CTAB. Gastrointestinal: Soft and nontender. No distention.  Musculoskeletal: No lower extremity tenderness nor edema. No gross deformities of extremities. Neurologic:  Normal speech and language. No gross focal neurologic deficits are appreciated.  Skin:  Skin is warm, dry and intact. No rash noted.  ____________________________________________  RADIOLOGY  None ____________________________________________   PROCEDURES  Procedure(s) performed:   Procedures  None ____________________________________________   INITIAL IMPRESSION / ASSESSMENT AND PLAN / ED COURSE  Pertinent labs & imaging results that were available during my care of the patient were reviewed by me and considered in my medical decision making (see chart for details).  Patient returns to the emergency department with migraine headache type symptoms.  She has had multiple migraines in the past and states this feels the same as her prior headaches.  No numbness or weakness.  Patient does have ongoing breast cancer but without neurological deficit and history of similar headaches I do not plan for CT imaging of the head at this time.  Plan for migraine medications.  She does have allergy to Benadryl.  Will add Decadron.  Patient has not had recent steroids.   Patient with continued symptoms. Ordered CT head with breast cancer treatment. Care transferred to Dr. Florina Ou who will follow CT imaging and reassess prior to discharge.    ____________________________________________  FINAL CLINICAL IMPRESSION(S) / ED DIAGNOSES  Final diagnoses:  Bad headache     MEDICATIONS GIVEN DURING THIS VISIT:  Medications  ketorolac (TORADOL) 30 MG/ML injection 30 mg (30 mg Intravenous Given 09/23/18 2208)  ondansetron (ZOFRAN) injection 4 mg (4 mg Intravenous Given 09/23/18 2208)  sodium chloride 0.9 % bolus 500 mL (0 mLs Intravenous Stopped 09/23/18 2332)  dexamethasone (DECADRON) injection 10 mg (10 mg Intravenous Given 09/23/18 2208)  magnesium sulfate IVPB 2 g 50 mL (0 g Intravenous Stopped 09/23/18 2332)  ondansetron (ZOFRAN) injection 4 mg (4 mg Intravenous Given 09/23/18 2333)  LORazepam (ATIVAN) injection 0.5 mg (0.5 mg Intravenous Given 09/24/18 0016)     Note:  This document was prepared using Dragon voice recognition software and may include unintentional dictation errors.  Nanda Quinton, MD Emergency Medicine    Samaiya Awadallah, [facility], MD 09/24/18 [phone]

## 2018-09-24 ENCOUNTER — Encounter (HOSPITAL_COMMUNITY): Payer: Self-pay | Admitting: Oncology

## 2018-09-24 ENCOUNTER — Encounter (HOSPITAL_COMMUNITY): Payer: Self-pay | Admitting: Radiology

## 2018-09-24 ENCOUNTER — Emergency Department (HOSPITAL_COMMUNITY): Payer: BLUE CROSS/BLUE SHIELD

## 2018-09-24 ENCOUNTER — Emergency Department (HOSPITAL_COMMUNITY)
Admission: EM | Admit: 2018-09-24 | Discharge: 2018-09-24 | Disposition: A | Discharge status: Home / Self Care | Payer: BLUE CROSS/BLUE SHIELD | Source: Home / Self Care | Attending: Emergency Medicine | Admitting: Emergency Medicine

## 2018-09-24 ENCOUNTER — Other Ambulatory Visit: Payer: Self-pay

## 2018-09-24 DIAGNOSIS — Y999 Unspecified external cause status: Secondary | ICD-10-CM | POA: Insufficient documentation

## 2018-09-24 DIAGNOSIS — S0990XA Unspecified injury of head, initial encounter: Secondary | ICD-10-CM | POA: Diagnosis not present

## 2018-09-24 DIAGNOSIS — Z853 Personal history of malignant neoplasm of breast: Secondary | ICD-10-CM

## 2018-09-24 DIAGNOSIS — E039 Hypothyroidism, unspecified: Secondary | ICD-10-CM | POA: Insufficient documentation

## 2018-09-24 DIAGNOSIS — Y9389 Activity, other specified: Secondary | ICD-10-CM

## 2018-09-24 DIAGNOSIS — R079 Chest pain, unspecified: Secondary | ICD-10-CM | POA: Diagnosis not present

## 2018-09-24 DIAGNOSIS — Y92410 Unspecified street and highway as the place of occurrence of the external cause: Secondary | ICD-10-CM | POA: Insufficient documentation

## 2018-09-24 DIAGNOSIS — R400 Somnolence: Secondary | ICD-10-CM | POA: Diagnosis not present

## 2018-09-24 DIAGNOSIS — S20219A Contusion of unspecified front wall of thorax, initial encounter: Secondary | ICD-10-CM | POA: Insufficient documentation

## 2018-09-24 DIAGNOSIS — Z79899 Other long term (current) drug therapy: Secondary | ICD-10-CM

## 2018-09-24 DIAGNOSIS — R51 Headache: Secondary | ICD-10-CM | POA: Diagnosis not present

## 2018-09-24 DIAGNOSIS — S299XXA Unspecified injury of thorax, initial encounter: Secondary | ICD-10-CM | POA: Diagnosis not present

## 2018-09-24 DIAGNOSIS — R0789 Other chest pain: Secondary | ICD-10-CM | POA: Diagnosis not present

## 2018-09-24 HISTORY — DX: Malignant (primary) neoplasm, unspecified: C80.1

## 2018-09-24 LAB — I-STAT CHEM 8, ED
BUN: 12 mg/dL (ref 8–23)
CALCIUM ION: 1.15 mmol/L (ref 1.15–1.40)
CREATININE: 0.6 mg/dL (ref 0.44–1.00)
Chloride: 99 mmol/L (ref 98–111)
Glucose, Bld: 151 mg/dL — ABNORMAL HIGH (ref 70–99)
HCT: 38 % (ref 36.0–46.0)
Hemoglobin: 12.9 g/dL (ref 12.0–15.0)
Potassium: 4.1 mmol/L (ref 3.5–5.1)
Sodium: 136 mmol/L (ref 135–145)
TCO2: 26 mmol/L (ref 22–32)

## 2018-09-24 LAB — I-STAT TROPONIN, ED: Troponin i, poc: 0 ng/mL (ref 0.00–0.08)

## 2018-09-24 MED ORDER — ATENOLOL 25 MG PO TABS
12.5000 mg | ORAL_TABLET | Freq: Once | ORAL | Status: AC
  Administered 2018-09-24: 12.5 mg via ORAL
  Filled 2018-09-24: qty 1

## 2018-09-24 MED ORDER — PANTOPRAZOLE SODIUM 40 MG IV SOLR
40.0000 mg | Freq: Once | INTRAVENOUS | Status: AC
  Administered 2018-09-24: 40 mg via INTRAVENOUS
  Filled 2018-09-24: qty 40

## 2018-09-24 MED ORDER — HYDROXYZINE HCL 25 MG PO TABS
25.0000 mg | ORAL_TABLET | Freq: Once | ORAL | Status: DC
  Filled 2018-09-24: qty 1

## 2018-09-24 MED ORDER — LEVOTHYROXINE SODIUM 75 MCG PO TABS
75.0000 ug | ORAL_TABLET | Freq: Every day | ORAL | Status: DC
  Administered 2018-09-24: 75 ug via ORAL
  Filled 2018-09-24: qty 1

## 2018-09-24 MED ORDER — IOHEXOL 300 MG/ML  SOLN
100.0000 mL | Freq: Once | INTRAMUSCULAR | Status: AC | PRN
  Administered 2018-09-24: 100 mL via INTRAVENOUS

## 2018-09-24 NOTE — ED Notes (Signed)
Patient is resting with call bell in reach  
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
## 2018-09-24 NOTE — ED Notes (Signed)
Walked patient to the bathroom  
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
## 2018-09-24 NOTE — ED Notes (Signed)
Pt discharged from ED; instructions provided and scripts given; Pt encouraged to return to ED if symptoms worsen and to f/u with PCP; Pt verbalized understanding of all instructions 

## 2018-09-24 NOTE — Discharge Instructions (Addendum)
You had a CT scan of your chest performed today that showed very small cysts on your liver.  Please follow up with your family doctor for recheck.  You have a possible broken nose.  Do not blow your nose for the next 5 days.  You may take ibuprofen, available over the counter for the next 3-5 days according to label instructions as needed for pain.  Get rechecked if you have any new or concerning symptoms.

## 2018-09-26 ENCOUNTER — Ambulatory Visit (INDEPENDENT_AMBULATORY_CARE_PROVIDER_SITE_OTHER): Payer: BLUE CROSS/BLUE SHIELD | Admitting: Family Medicine

## 2018-09-26 ENCOUNTER — Encounter: Payer: Self-pay | Admitting: Family Medicine

## 2018-09-26 ENCOUNTER — Other Ambulatory Visit: Payer: Self-pay | Admitting: General Surgery

## 2018-09-26 DIAGNOSIS — S20219A Contusion of unspecified front wall of thorax, initial encounter: Secondary | ICD-10-CM | POA: Diagnosis not present

## 2018-09-26 DIAGNOSIS — C50311 Malignant neoplasm of lower-inner quadrant of right female breast: Secondary | ICD-10-CM

## 2018-09-26 DIAGNOSIS — G43109 Migraine with aura, not intractable, without status migrainosus: Secondary | ICD-10-CM | POA: Diagnosis not present

## 2018-09-26 DIAGNOSIS — C50211 Malignant neoplasm of upper-inner quadrant of right female breast: Secondary | ICD-10-CM | POA: Diagnosis not present

## 2018-09-26 DIAGNOSIS — Z170 Estrogen receptor positive status [ER+]: Secondary | ICD-10-CM

## 2018-09-26 DIAGNOSIS — J069 Acute upper respiratory infection, unspecified: Secondary | ICD-10-CM | POA: Insufficient documentation

## 2018-09-26 DIAGNOSIS — F430 Acute stress reaction: Secondary | ICD-10-CM

## 2018-09-26 DIAGNOSIS — B9789 Other viral agents as the cause of diseases classified elsewhere: Secondary | ICD-10-CM

## 2018-09-26 MED ORDER — BENZONATATE 200 MG PO CAPS: 200.0000 mg | ORAL_CAPSULE | Freq: Three times a day (TID) | ORAL | 1 refills | Status: DC | PRN

## 2018-09-26 MED ORDER — BUSPIRONE HCL 15 MG PO TABS: 7.5000 mg | ORAL_TABLET | Freq: Two times a day (BID) | ORAL | 3 refills | Status: DC

## 2018-09-26 NOTE — Patient Instructions (Addendum)
Continue nasal saline and guaifenesin  Try the tessalon for cough as needed Lots of fluids and rest   Update if not starting to improve in a week or if worsening    Do move forward with counseling with the breast treatment clinic   You can increase your hydroxyzine to every 8 hours   If you want to try buspar - here is a px to hold on to   Meditation is a safe way to help anxiety /sleep/mood and focus  Calm is a great app   I really think you should stay home for at least 2-3 days to rest and recover from what you have been through
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
## 2018-09-26 NOTE — Assessment & Plan Note (Signed)
Worse lately due to weather change and uri  Has had several ED visits Improved today  Reviewed hospital records, lab results and studies in detail
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
## 2018-09-26 NOTE — Assessment & Plan Note (Signed)
Reassuring exam  Cough hurts due to cw contusion Tessalon px  Pt declined any more sedating medicine Disc symptomatic care - see instructions on AVS  Update if not starting to improve in a week or if worsening   Will watch for s/s of sinusitis esp in light of nasal injury
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
## 2018-09-27 ENCOUNTER — Encounter: Payer: Self-pay | Admitting: Family Medicine

## 2018-09-27 ENCOUNTER — Ambulatory Visit (HOSPITAL_COMMUNITY)
Admission: RE | Admit: 2018-09-27 | Discharge: 2018-09-27 | Disposition: A | Discharge status: Home / Self Care | Payer: BLUE CROSS/BLUE SHIELD | Source: Ambulatory Visit | Attending: General Surgery | Admitting: General Surgery

## 2018-09-27 ENCOUNTER — Ambulatory Visit (INDEPENDENT_AMBULATORY_CARE_PROVIDER_SITE_OTHER): Payer: BLUE CROSS/BLUE SHIELD | Admitting: Family Medicine

## 2018-09-27 VITALS — BP 104/66 | HR 67 | Temp 98.1°F | Ht 61.0 in | Wt 126.2 lb

## 2018-09-27 DIAGNOSIS — N3001 Acute cystitis with hematuria: Secondary | ICD-10-CM | POA: Diagnosis not present

## 2018-09-27 DIAGNOSIS — R319 Hematuria, unspecified: Secondary | ICD-10-CM | POA: Diagnosis not present

## 2018-09-27 DIAGNOSIS — Z170 Estrogen receptor positive status [ER+]: Secondary | ICD-10-CM | POA: Diagnosis not present

## 2018-09-27 DIAGNOSIS — N644 Mastodynia: Secondary | ICD-10-CM | POA: Insufficient documentation

## 2018-09-27 DIAGNOSIS — C50911 Malignant neoplasm of unspecified site of right female breast: Secondary | ICD-10-CM | POA: Diagnosis not present

## 2018-09-27 DIAGNOSIS — C50211 Malignant neoplasm of upper-inner quadrant of right female breast: Secondary | ICD-10-CM | POA: Insufficient documentation

## 2018-09-27 DIAGNOSIS — N390 Urinary tract infection, site not specified: Secondary | ICD-10-CM | POA: Insufficient documentation

## 2018-09-27 LAB — POC URINALSYSI DIPSTICK (AUTOMATED)
BILIRUBIN UA: NEGATIVE
GLUCOSE UA: NEGATIVE
KETONES UA: NEGATIVE
NITRITE UA: NEGATIVE
Protein, UA: POSITIVE — AB
SPEC GRAV UA: 1.01 (ref 1.010–1.025)
Urobilinogen, UA: 0.2 E.U./dL
pH, UA: 6 (ref 5.0–8.0)

## 2018-09-27 MED ORDER — GADOBUTROL 1 MMOL/ML IV SOLN
7.5000 mL | Freq: Once | INTRAVENOUS | Status: AC | PRN
  Administered 2018-09-27: 6 mL via INTRAVENOUS

## 2018-09-27 MED ORDER — CEPHALEXIN 500 MG PO CAPS: 500.0000 mg | ORAL_CAPSULE | Freq: Two times a day (BID) | ORAL | 0 refills | Status: DC
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
## 2018-09-27 NOTE — Patient Instructions (Signed)
Drink lots of water  Take the keflex twice daily as directed  (start now - one now and one tonight)   If not starting to improve in several days please let us know We will contact you with urine culture results   If completely better in 5 days-can stop antibiotic (otherwise finish it)
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
## 2018-09-28 ENCOUNTER — Telehealth: Payer: Self-pay | Admitting: Hematology and Oncology

## 2018-09-28 LAB — URINE CULTURE
MICRO NUMBER: 91330623
SPECIMEN QUALITY: ADEQUATE

## 2018-09-28 NOTE — Telephone Encounter (Signed)
I spoke with patient regarding upcoming appts/ letter/calendar mailed per 11/6 sch msg

## 2018-10-03 DIAGNOSIS — G43019 Migraine without aura, intractable, without status migrainosus: Secondary | ICD-10-CM | POA: Diagnosis not present

## 2018-10-03 DIAGNOSIS — G43719 Chronic migraine without aura, intractable, without status migrainosus: Secondary | ICD-10-CM | POA: Diagnosis not present

## 2018-10-04 ENCOUNTER — Encounter (HOSPITAL_BASED_OUTPATIENT_CLINIC_OR_DEPARTMENT_OTHER): Payer: Self-pay | Admitting: *Deleted

## 2018-10-04 ENCOUNTER — Other Ambulatory Visit: Payer: Self-pay

## 2018-10-07 ENCOUNTER — Ambulatory Visit
Admission: RE | Admit: 2018-10-07 | Discharge: 2018-10-07 | Disposition: A | Discharge status: Home / Self Care | Payer: BLUE CROSS/BLUE SHIELD | Source: Ambulatory Visit | Attending: General Surgery | Admitting: General Surgery

## 2018-10-07 ENCOUNTER — Other Ambulatory Visit: Payer: Self-pay | Admitting: General Surgery

## 2018-10-07 DIAGNOSIS — C50211 Malignant neoplasm of upper-inner quadrant of right female breast: Secondary | ICD-10-CM | POA: Diagnosis not present

## 2018-10-07 DIAGNOSIS — Z170 Estrogen receptor positive status [ER+]: Secondary | ICD-10-CM

## 2018-10-07 DIAGNOSIS — C50311 Malignant neoplasm of lower-inner quadrant of right female breast: Secondary | ICD-10-CM

## 2018-10-07 NOTE — Progress Notes (Signed)
Ensure pre surgical drink given to patient with instructions. Verbalized understanding

## 2018-10-10 ENCOUNTER — Ambulatory Visit (HOSPITAL_BASED_OUTPATIENT_CLINIC_OR_DEPARTMENT_OTHER)
Admission: RE | Admit: 2018-10-10 | Discharge: 2018-10-10 | Disposition: A | Discharge status: Home / Self Care | Payer: BLUE CROSS/BLUE SHIELD | Source: Ambulatory Visit | Attending: General Surgery | Admitting: General Surgery

## 2018-10-10 ENCOUNTER — Other Ambulatory Visit: Payer: Self-pay

## 2018-10-10 ENCOUNTER — Encounter (HOSPITAL_BASED_OUTPATIENT_CLINIC_OR_DEPARTMENT_OTHER): Payer: Self-pay

## 2018-10-10 ENCOUNTER — Ambulatory Visit (HOSPITAL_BASED_OUTPATIENT_CLINIC_OR_DEPARTMENT_OTHER): Payer: BLUE CROSS/BLUE SHIELD | Admitting: Anesthesiology

## 2018-10-10 ENCOUNTER — Encounter (HOSPITAL_COMMUNITY)
Admission: RE | Admit: 2018-10-10 | Discharge: 2018-10-10 | Disposition: A | Discharge status: Home / Self Care | Payer: BLUE CROSS/BLUE SHIELD | Source: Ambulatory Visit | Attending: General Surgery | Admitting: General Surgery

## 2018-10-10 ENCOUNTER — Encounter (HOSPITAL_BASED_OUTPATIENT_CLINIC_OR_DEPARTMENT_OTHER)
Admission: RE | Disposition: A | Discharge status: Home / Self Care | Payer: Self-pay | Source: Ambulatory Visit | Attending: General Surgery

## 2018-10-10 ENCOUNTER — Ambulatory Visit
Admission: RE | Admit: 2018-10-10 | Discharge: 2018-10-10 | Disposition: A | Discharge status: Home / Self Care | Payer: BLUE CROSS/BLUE SHIELD | Source: Ambulatory Visit | Attending: General Surgery | Admitting: General Surgery

## 2018-10-10 DIAGNOSIS — Z820 Family history of epilepsy and other diseases of the nervous system: Secondary | ICD-10-CM | POA: Insufficient documentation

## 2018-10-10 DIAGNOSIS — F419 Anxiety disorder, unspecified: Secondary | ICD-10-CM | POA: Insufficient documentation

## 2018-10-10 DIAGNOSIS — Z818 Family history of other mental and behavioral disorders: Secondary | ICD-10-CM | POA: Diagnosis not present

## 2018-10-10 DIAGNOSIS — K219 Gastro-esophageal reflux disease without esophagitis: Secondary | ICD-10-CM | POA: Insufficient documentation

## 2018-10-10 DIAGNOSIS — Z8249 Family history of ischemic heart disease and other diseases of the circulatory system: Secondary | ICD-10-CM | POA: Diagnosis not present

## 2018-10-10 DIAGNOSIS — C50311 Malignant neoplasm of lower-inner quadrant of right female breast: Secondary | ICD-10-CM

## 2018-10-10 DIAGNOSIS — Z170 Estrogen receptor positive status [ER+]: Secondary | ICD-10-CM | POA: Insufficient documentation

## 2018-10-10 DIAGNOSIS — E039 Hypothyroidism, unspecified: Secondary | ICD-10-CM | POA: Insufficient documentation

## 2018-10-10 DIAGNOSIS — Z841 Family history of disorders of kidney and ureter: Secondary | ICD-10-CM | POA: Insufficient documentation

## 2018-10-10 DIAGNOSIS — G43909 Migraine, unspecified, not intractable, without status migrainosus: Secondary | ICD-10-CM | POA: Diagnosis not present

## 2018-10-10 DIAGNOSIS — E119 Type 2 diabetes mellitus without complications: Secondary | ICD-10-CM | POA: Diagnosis not present

## 2018-10-10 DIAGNOSIS — C50211 Malignant neoplasm of upper-inner quadrant of right female breast: Secondary | ICD-10-CM | POA: Diagnosis not present

## 2018-10-10 DIAGNOSIS — Z8261 Family history of arthritis: Secondary | ICD-10-CM | POA: Diagnosis not present

## 2018-10-10 DIAGNOSIS — Z8371 Family history of colonic polyps: Secondary | ICD-10-CM | POA: Insufficient documentation

## 2018-10-10 DIAGNOSIS — Z811 Family history of alcohol abuse and dependence: Secondary | ICD-10-CM | POA: Insufficient documentation

## 2018-10-10 DIAGNOSIS — G8918 Other acute postprocedural pain: Secondary | ICD-10-CM | POA: Diagnosis not present

## 2018-10-10 DIAGNOSIS — M1990 Unspecified osteoarthritis, unspecified site: Secondary | ICD-10-CM | POA: Diagnosis not present

## 2018-10-10 DIAGNOSIS — C50911 Malignant neoplasm of unspecified site of right female breast: Secondary | ICD-10-CM | POA: Diagnosis not present

## 2018-10-10 HISTORY — DX: Unspecified osteoarthritis, unspecified site: M19.90

## 2018-10-10 HISTORY — PX: BREAST LUMPECTOMY WITH RADIOACTIVE SEED AND SENTINEL LYMPH NODE BIOPSY: SHX6550

## 2018-10-10 HISTORY — DX: Prediabetes: R73.03

## 2018-10-10 HISTORY — PX: BREAST LUMPECTOMY: SHX2

## 2018-10-10 HISTORY — DX: Anxiety disorder, unspecified: F41.9

## 2018-10-10 SURGERY — BREAST LUMPECTOMY WITH RADIOACTIVE SEED AND SENTINEL LYMPH NODE BIOPSY
Anesthesia: General | Site: Breast | Laterality: Right

## 2018-10-10 MED ORDER — FENTANYL CITRATE (PF) 100 MCG/2ML IJ SOLN
50.0000 ug | INTRAMUSCULAR | Status: AC | PRN
  Administered 2018-10-10: 50 ug via INTRAVENOUS
  Administered 2018-10-10 (×2): 25 ug via INTRAVENOUS
  Administered 2018-10-10: 50 ug via INTRAVENOUS

## 2018-10-10 MED ORDER — CEFAZOLIN SODIUM-DEXTROSE 2-4 GM/100ML-% IV SOLN
INTRAVENOUS | Status: AC
  Filled 2018-10-10: qty 100

## 2018-10-10 MED ORDER — CHLORHEXIDINE GLUCONATE CLOTH 2 % EX PADS: 6.0000 | MEDICATED_PAD | Freq: Once | CUTANEOUS | Status: DC

## 2018-10-10 MED ORDER — PROPOFOL 10 MG/ML IV BOLUS
INTRAVENOUS | Status: AC
  Filled 2018-10-10: qty 20

## 2018-10-10 MED ORDER — SODIUM CHLORIDE (PF) 0.9 % IJ SOLN
INTRAMUSCULAR | Status: AC
  Filled 2018-10-10: qty 10

## 2018-10-10 MED ORDER — SCOPOLAMINE 1 MG/3DAYS TD PT72: 1.0000 | MEDICATED_PATCH | Freq: Once | TRANSDERMAL | Status: DC | PRN

## 2018-10-10 MED ORDER — DEXAMETHASONE SODIUM PHOSPHATE 4 MG/ML IJ SOLN
INTRAMUSCULAR | Status: DC | PRN
  Administered 2018-10-10: 10 mg via INTRAVENOUS

## 2018-10-10 MED ORDER — GABAPENTIN 300 MG PO CAPS
300.0000 mg | ORAL_CAPSULE | ORAL | Status: AC
  Administered 2018-10-10: 300 mg via ORAL

## 2018-10-10 MED ORDER — CEFAZOLIN SODIUM-DEXTROSE 2-4 GM/100ML-% IV SOLN
2.0000 g | INTRAVENOUS | Status: AC
  Administered 2018-10-10: 2 g via INTRAVENOUS

## 2018-10-10 MED ORDER — BUPIVACAINE HCL (PF) 0.25 % IJ SOLN
INTRAMUSCULAR | Status: DC | PRN
  Administered 2018-10-10: 18 mL

## 2018-10-10 MED ORDER — GABAPENTIN 300 MG PO CAPS
ORAL_CAPSULE | ORAL | Status: AC
  Filled 2018-10-10: qty 1

## 2018-10-10 MED ORDER — PROMETHAZINE HCL 25 MG/ML IJ SOLN: 6.2500 mg | INTRAMUSCULAR | Status: DC | PRN

## 2018-10-10 MED ORDER — MIDAZOLAM HCL 2 MG/2ML IJ SOLN
1.0000 mg | INTRAMUSCULAR | Status: DC | PRN
  Administered 2018-10-10: 2 mg via INTRAVENOUS

## 2018-10-10 MED ORDER — METHYLENE BLUE 0.5 % INJ SOLN
INTRAVENOUS | Status: AC
  Filled 2018-10-10: qty 10

## 2018-10-10 MED ORDER — ACETAMINOPHEN 500 MG PO TABS
ORAL_TABLET | ORAL | Status: AC
  Filled 2018-10-10: qty 2

## 2018-10-10 MED ORDER — OXYCODONE HCL 5 MG PO TABS: 5.0000 mg | ORAL_TABLET | Freq: Four times a day (QID) | ORAL | 0 refills | Status: DC | PRN

## 2018-10-10 MED ORDER — FENTANYL CITRATE (PF) 100 MCG/2ML IJ SOLN: 25.0000 ug | INTRAMUSCULAR | Status: DC | PRN

## 2018-10-10 MED ORDER — TECHNETIUM TC 99M SULFUR COLLOID FILTERED
1.0000 | Freq: Once | INTRAVENOUS | Status: AC | PRN
  Administered 2018-10-10: 1 via INTRADERMAL

## 2018-10-10 MED ORDER — PROPOFOL 10 MG/ML IV BOLUS
INTRAVENOUS | Status: DC | PRN
  Administered 2018-10-10: 140 mg via INTRAVENOUS

## 2018-10-10 MED ORDER — FENTANYL CITRATE (PF) 100 MCG/2ML IJ SOLN
INTRAMUSCULAR | Status: AC
  Filled 2018-10-10: qty 2

## 2018-10-10 MED ORDER — BUPIVACAINE HCL (PF) 0.25 % IJ SOLN
INTRAMUSCULAR | Status: AC
  Filled 2018-10-10: qty 30

## 2018-10-10 MED ORDER — LACTATED RINGERS IV SOLN
INTRAVENOUS | Status: DC
  Administered 2018-10-10 (×2): via INTRAVENOUS

## 2018-10-10 MED ORDER — EPHEDRINE SULFATE 50 MG/ML IJ SOLN
INTRAMUSCULAR | Status: DC | PRN
  Administered 2018-10-10 (×2): 10 mg via INTRAVENOUS

## 2018-10-10 MED ORDER — ACETAMINOPHEN 500 MG PO TABS
1000.0000 mg | ORAL_TABLET | ORAL | Status: AC
  Administered 2018-10-10: 1000 mg via ORAL

## 2018-10-10 MED ORDER — ONDANSETRON HCL 4 MG/2ML IJ SOLN
INTRAMUSCULAR | Status: AC
  Filled 2018-10-10: qty 2

## 2018-10-10 MED ORDER — TRAMADOL HCL 50 MG PO TABS: 50.0000 mg | ORAL_TABLET | Freq: Four times a day (QID) | ORAL | 0 refills | Status: DC | PRN

## 2018-10-10 MED ORDER — LIDOCAINE 2% (20 MG/ML) 5 ML SYRINGE
INTRAMUSCULAR | Status: AC
  Filled 2018-10-10: qty 5

## 2018-10-10 MED ORDER — BUPIVACAINE HCL (PF) 0.5 % IJ SOLN
INTRAMUSCULAR | Status: AC
  Filled 2018-10-10: qty 30

## 2018-10-10 MED ORDER — ONDANSETRON HCL 4 MG/2ML IJ SOLN
INTRAMUSCULAR | Status: DC | PRN
  Administered 2018-10-10: 4 mg via INTRAVENOUS

## 2018-10-10 MED ORDER — MIDAZOLAM HCL 2 MG/2ML IJ SOLN
INTRAMUSCULAR | Status: AC
  Filled 2018-10-10: qty 2

## 2018-10-10 MED ORDER — LIDOCAINE HCL (CARDIAC) PF 100 MG/5ML IV SOSY
PREFILLED_SYRINGE | INTRAVENOUS | Status: DC | PRN
  Administered 2018-10-10: 60 mg via INTRAVENOUS

## 2018-10-10 SURGICAL SUPPLY — 49 items
ADH SKN CLS APL DERMABOND .7 (GAUZE/BANDAGES/DRESSINGS) ×1
APPLIER CLIP 9.375 MED OPEN (MISCELLANEOUS) ×3
APR CLP MED 9.3 20 MLT OPN (MISCELLANEOUS) ×1
BLADE SURG 15 STRL LF DISP TIS (BLADE) ×1 IMPLANT
BLADE SURG 15 STRL SS (BLADE) ×3
CANISTER SUC SOCK COL 7IN (MISCELLANEOUS) IMPLANT
CANISTER SUCT 1200ML W/VALVE (MISCELLANEOUS) IMPLANT
CHLORAPREP W/TINT 26ML (MISCELLANEOUS) ×3 IMPLANT
CLIP APPLIE 9.375 MED OPEN (MISCELLANEOUS) ×1 IMPLANT
COVER BACK TABLE 60X90IN (DRAPES) ×3 IMPLANT
COVER MAYO STAND STRL (DRAPES) ×3 IMPLANT
COVER PROBE W GEL 5X96 (DRAPES) ×3 IMPLANT
COVER WAND RF STERILE (DRAPES) IMPLANT
DECANTER SPIKE VIAL GLASS SM (MISCELLANEOUS) IMPLANT
DERMABOND ADVANCED (GAUZE/BANDAGES/DRESSINGS) ×2
DERMABOND ADVANCED .7 DNX12 (GAUZE/BANDAGES/DRESSINGS) ×1 IMPLANT
DEVICE DUBIN W/COMP PLATE 8390 (MISCELLANEOUS) ×3 IMPLANT
DRAPE LAPAROSCOPIC ABDOMINAL (DRAPES) ×3 IMPLANT
DRAPE UTILITY XL STRL (DRAPES) ×3 IMPLANT
ELECT COATED BLADE 2.86 ST (ELECTRODE) ×3 IMPLANT
ELECT REM PT RETURN 9FT ADLT (ELECTROSURGICAL) ×3
ELECTRODE REM PT RTRN 9FT ADLT (ELECTROSURGICAL) ×1 IMPLANT
GLOVE BIO SURGEON STRL SZ7.5 (GLOVE) ×3 IMPLANT
GLOVE BIOGEL PI IND STRL 7.0 (GLOVE) IMPLANT
GLOVE BIOGEL PI INDICATOR 7.0 (GLOVE) ×2
GLOVE ECLIPSE 6.5 STRL STRAW (GLOVE) ×4 IMPLANT
GOWN STRL REUS W/ TWL LRG LVL3 (GOWN DISPOSABLE) ×2 IMPLANT
GOWN STRL REUS W/TWL LRG LVL3 (GOWN DISPOSABLE) ×6
ILLUMINATOR WAVEGUIDE N/F (MISCELLANEOUS) IMPLANT
KIT MARKER MARGIN INK (KITS) ×3 IMPLANT
LIGHT WAVEGUIDE WIDE FLAT (MISCELLANEOUS) IMPLANT
NDL HYPO 25X1 1.5 SAFETY (NEEDLE) ×1 IMPLANT
NDL SAFETY ECLIPSE 18X1.5 (NEEDLE) IMPLANT
NEEDLE HYPO 18GX1.5 SHARP (NEEDLE)
NEEDLE HYPO 25X1 1.5 SAFETY (NEEDLE) ×3 IMPLANT
NS IRRIG 1000ML POUR BTL (IV SOLUTION) IMPLANT
PACK BASIN DAY SURGERY FS (CUSTOM PROCEDURE TRAY) ×3 IMPLANT
PENCIL BUTTON HOLSTER BLD 10FT (ELECTRODE) ×3 IMPLANT
SLEEVE SCD COMPRESS KNEE MED (MISCELLANEOUS) ×3 IMPLANT
SPONGE LAP 18X18 RF (DISPOSABLE) ×3 IMPLANT
SUT MON AB 4-0 PC3 18 (SUTURE) ×6 IMPLANT
SUT SILK 2 0 SH (SUTURE) IMPLANT
SUT VICRYL 3-0 CR8 SH (SUTURE) ×3 IMPLANT
SYR CONTROL 10ML LL (SYRINGE) ×3 IMPLANT
TOWEL GREEN STERILE FF (TOWEL DISPOSABLE) ×3 IMPLANT
TOWEL OR NON WOVEN STRL DISP B (DISPOSABLE) ×3 IMPLANT
TUBE CONNECTING 20'X1/4 (TUBING)
TUBE CONNECTING 20X1/4 (TUBING) IMPLANT
YANKAUER SUCT BULB TIP NO VENT (SUCTIONS) IMPLANT

## 2018-10-10 NOTE — Op Note (Signed)
10/10/2018  9:27 AM  PATIENT:  Jennifer [name]  62 y.o. female  PRE-OPERATIVE DIAGNOSIS:  RIGHT BREAST CANCER  POST-OPERATIVE DIAGNOSIS:  RIGHT BREAST CANCER  PROCEDURE:  Procedure(s): RIGHT BREAST LUMPECTOMY WITH RADIOACTIVE SEED LOCALIZATION AND DEEP RIGHT AXILLARY SENTINEL LYMPH NODE BIOPSY (Right)  SURGEON:  Surgeon(s) and Role:    * Jovita Kussmaul, MD - Primary  PHYSICIAN ASSISTANT:   ASSISTANTS: none   ANESTHESIA:   local and general  EBL:  minimal   BLOOD ADMINISTERED:none  DRAINS: none   LOCAL MEDICATIONS USED:  MARCAINE     SPECIMEN:  Source of Specimen:  right breast tissue and sentinel node X 2  DISPOSITION OF SPECIMEN:  PATHOLOGY  COUNTS:  YES  TOURNIQUET:  * No tourniquets in log *  DICTATION: .Dragon Dictation   After informed consent was obtained the patient was brought to the operating room and placed in the supine position on the operating table.  After adequate induction of general anesthesia the patient's right chest, breast, and axillary area were prepped with ChloraPrep, allowed to dry, and draped in usual sterile manner.  An appropriate timeout was performed.  Previously an I-125 seed was placed in the inner aspect of the right breast to mark an area of invasive breast cancer.  Earlier in the day the patient also underwent injection of 1 mCi of technetium sulfur colloid in the subareolar position on the right.  The neoprobe was initially set to technetium and there was a good signal in the right axilla.  This area was infiltrated with quarter percent Marcaine.  A small transversely oriented incision was made with a 15 blade knife overlying the area of radioactivity.  The incision was carried through the skin and subcutaneous tissue sharply with the electrocautery until the deep right axillary space was entered.  Blunt hemostat dissection was then directed by the neoprobe until I was able to identify 2 lymph nodes with increased radioactivity.  These were  excised sharply with the electrocautery and the lymphatics were controlled with clips.  Ex vivo counts on these nodes were approximately 400 and 1200.  These were sent as sentinel nodes numbers 1 and 2.  There were no other hot or palpable lymph nodes in the right axilla.  The deep layer of the wound was then closed with interrupted 3-0 Vicryl stitches.  The skin was then closed with a running 4-0 Monocryl subcuticular stitch.  Attention was then turned to the right breast.  The neoprobe was set to I-125 in the area of radioactivity was readily identified far in the upper inner aspect of the right breast.  Because of this location and the thinness of the breast tissue at this site I was not able to perform hidden scar.  An elliptical incision was made in the skin overlying the area of radioactivity with a 15 blade knife.  The incision was carried through the skin and subcutaneous tissue sharply with electrocautery.  A circular portion of breast tissue was then excised sharply around the radioactive seed while checking the area of radioactivity frequently.  This dissection was carried all the way to the chest wall.  Once the specimen was removed it was oriented with the appropriate paint colors.  A specimen radiograph was obtained that showed the clip and seed to be near the center of the specimen.  Because of the appearance of the specimen I did decide to remove an additional medial and superior margin and these were marked appropriately.  The tissue was  then all sent to pathology for further evaluation.  Hemostasis was achieved using the Bovie electrocautery.  The breast tissue superiorly and inferiorly was mobilized off the chest wall as well as mobilized away from the skin and subcutaneous tissue.  I was then able to close this breast tissue with interrupted 3-0 Vicryl stitches.  The skin was then closed with a running 4-0 Monocryl subcuticular stitch.  Dermabond dressings were applied.  The patient tolerated the  procedure well.  At the end of the case all needle sponge and instrument counts were correct.  The patient was then awakened and taken to recovery in stable condition.  PLAN OF CARE: Discharge to home after PACU  PATIENT DISPOSITION:  PACU - hemodynamically stable.   Delay start of Pharmacological VTE agent (>24hrs) due to surgical blood loss or risk of bleeding: not applicable

## 2018-10-10 NOTE — Interval H&P Note (Signed)
History and Physical Interval Note:  10/10/2018 8:01 AM  Jennifer [name]  has presented today for surgery, with the diagnosis of RIGHT BREAST CANCER  The various methods of treatment have been discussed with the patient and family. After consideration of risks, benefits and other options for treatment, the patient has consented to  Procedure(s): RIGHT BREAST LUMPECTOMY WITH RADIOACTIVE SEED AND RIGHT SENTINEL LYMPH NODE BIOPSY (Right) as a surgical intervention .  The patient's history has been reviewed, patient examined, no change in status, stable for surgery.  I have reviewed the patient's chart and labs.  Questions were answered to the patient's satisfaction.     [name]

## 2018-10-10 NOTE — Anesthesia Postprocedure Evaluation (Signed)
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  Procedure(s) Performed: RIGHT BREAST LUMPECTOMY WITH RADIOACTIVE SEED AND RIGHT SENTINEL LYMPH NODE BIOPSY (Right Breast)     Patient location during evaluation: PACU Anesthesia Type: General Level of consciousness: awake and alert, oriented and awake Pain management: pain level controlled Vital Signs Assessment: post-procedure vital signs reviewed and stable Respiratory status: spontaneous breathing, nonlabored ventilation and respiratory function stable Cardiovascular status: blood pressure returned to baseline and stable Postop Assessment: no apparent nausea or vomiting Anesthetic complications: no    Last Vitals:  Vitals:   10/10/18 1015 10/10/18 1030  BP: 122/76 124/72  Pulse: 61 61  Resp: 13 13  Temp: (!) 36.4 C   SpO2: 100% 99%    Last Pain:  Vitals:   10/10/18 1015  TempSrc:   PainSc: 0-No pain                 Catalina Gravel

## 2018-10-10 NOTE — Anesthesia Procedure Notes (Signed)
Anesthesia Regional Block: Pectoralis block   Pre-Anesthetic Checklist: ,, timeout performed, Correct Patient, Correct Site, Correct Laterality, Correct Procedure, Correct Position, site marked, Risks and benefits discussed,  Surgical consent,  Pre-op evaluation,  At surgeon's request and post-op pain management  Laterality: Right  Prep: chloraprep       Needles:  Injection technique: Single-shot  Needle Type: Echogenic Needle     Needle Length: 9cm  Needle Gauge: 21     Additional Needles:   Procedures:,,,, ultrasound used (permanent image in chart),,,,  Narrative:  Start time: 10/10/2018 7:15 AM End time: 10/10/2018 7:23 AM Injection made incrementally with aspirations every 5 mL.  Performed by: Personally  Anesthesiologist: Catalina Gravel, MD  Additional Notes: No pain on injection. No increased resistance to injection. Injection made in 5cc increments.  Good needle visualization.  Patient tolerated procedure well.

## 2018-10-10 NOTE — Transfer of Care (Signed)
Immediate Anesthesia Transfer of Care Note  Patient: Jennifer [name]  Procedure(s) Performed: RIGHT BREAST LUMPECTOMY WITH RADIOACTIVE SEED AND RIGHT SENTINEL LYMPH NODE BIOPSY (Right Breast)  Patient Location: PACU  Anesthesia Type:GA combined with regional for post-op pain  Level of Consciousness: sedated  Airway & Oxygen Therapy: Patient Spontanous Breathing and Patient connected to face mask oxygen  Post-op Assessment: Report given to RN and Post -op Vital signs reviewed and stable  Post vital signs: Reviewed and stable  Last Vitals:  Vitals Value Taken Time  BP 116/70 10/10/2018  9:33 AM  Temp    Pulse 59 10/10/2018  9:36 AM  Resp 0 10/10/2018  9:36 AM  SpO2 100 % 10/10/2018  9:36 AM  Vitals shown include unvalidated device data.  Last Pain:  Vitals:   10/10/18 0933  TempSrc:   PainSc: (P) 0-No pain         Complications: No apparent anesthesia complications

## 2018-10-10 NOTE — Progress Notes (Signed)
Assisted Dr. Gifford Shave with right, ultrasound guided, pectoralis block. Side rails up, monitors on throughout procedure. See vital signs in flow sheet. Tolerated Procedure well.

## 2018-10-10 NOTE — H&P (Signed)
Jennifer [name]  Location: [facility] Surgery Patient #: [id number] DOB: Nov 02, 1956 Undefined / Language: Cleophus Molt / Race: White Female   History of Present Illness The patient is a 62 year old female who presents with breast cancer. We are asked to see the patient in consultation by Dr. Lisbeth Renshaw to evaluate her for a new right breast cancer. The patient is a 62 year old white female who presents with a 1.7cm distortion in the LIQ of the right breast. the axilla looked neg. It was biopsied and came back as grade I IDC that was Er and Pr + and Her2 - with a Ki67 of 10%. She does not smoke.   Past Surgical History  Breast Biopsy  Bilateral. multiple Breast Mass; Local Excision  Bilateral. Shoulder Surgery  Bilateral.  Diagnostic Studies History Colonoscopy  5-10 years ago  Medication History Medications Reconciled  Social History  No drug use  Tobacco use  Never smoker.  Family History Alcohol Abuse  Sister. Colon Polyps  Sister. Depression  Brother, Family Members In General. Heart Disease  Father. Hypertension  Father. Kidney Disease  Sister. Migraine Headache  Mother.  Pregnancy / Birth History Age at menarche  58 years. Age of menopause  7-55 Gravida  0 Regular periods   Other Problems Arthritis  Diabetes Mellitus  Lump In Breast  Migraine Headache  Transfusion history     Review of Systems General Present- Fatigue and Weight Gain. Not Present- Appetite Loss, Chills, Fever, Night Sweats and Weight Loss. HEENT Present- Seasonal Allergies and Sore Throat. Not Present- Earache, Hearing Loss, Hoarseness, Nose Bleed, Oral Ulcers, Ringing in the Ears, Sinus Pain, Visual Disturbances, Wears glasses/contact lenses and Yellow Eyes. Respiratory Not Present- Bloody sputum, Chronic Cough, Difficulty Breathing, Snoring and Wheezing. Breast Not Present- Breast Mass, Breast Pain, Nipple Discharge and Skin Changes. Cardiovascular Not Present- Chest Pain,  Difficulty Breathing Lying Down, Leg Cramps, Palpitations, Rapid Heart Rate, Shortness of Breath and Swelling of Extremities. Gastrointestinal Not Present- Abdominal Pain, Bloating, Bloody Stool, Change in Bowel Habits, Chronic diarrhea, Constipation, Difficulty Swallowing, Excessive gas, Gets full quickly at meals, Hemorrhoids, Indigestion, Nausea, Rectal Pain and Vomiting. Musculoskeletal Present- Joint Pain. Not Present- Back Pain, Joint Stiffness, Muscle Pain, Muscle Weakness and Swelling of Extremities. Neurological Not Present- Decreased Memory, Fainting, Headaches, Numbness, Seizures, Tingling, Tremor, Trouble walking and Weakness. Psychiatric Not Present- Anxiety, Bipolar, Change in Sleep Pattern, Depression, Fearful and Frequent crying. Endocrine Present- Cold Intolerance. Not Present- Excessive Hunger, Hair Changes, Heat Intolerance, Hot flashes and New Diabetes. Hematology Not Present- Blood Thinners, Easy Bruising, Excessive bleeding, Gland problems, HIV and Persistent Infections.   Physical Exam  General Mental Status-Alert. General Appearance-Consistent with stated age. Hydration-Well hydrated. Voice-Normal.  Head and Neck Head-normocephalic, atraumatic with no lesions or palpable masses. Trachea-midline. Thyroid Gland Characteristics - normal size and consistency.  Eye Eyeball - Bilateral-Extraocular movements intact. Sclera/Conjunctiva - Bilateral-No scleral icterus.  Chest and Lung Exam Chest and lung exam reveals -quiet, even and easy respiratory effort with no use of accessory muscles and on auscultation, normal breath sounds, no adventitious sounds and normal vocal resonance. Inspection Chest Wall - Normal. Back - normal.  Breast Note: there is no palpable mass in either breast. there is no palpable axillary, supraclavicular, or cervical lymphadenopathy   Cardiovascular Cardiovascular examination reveals -normal heart sounds, regular rate  and rhythm with no murmurs and normal pedal pulses bilaterally.  Abdomen Inspection Inspection of the abdomen reveals - No Hernias. Skin - Scar - no surgical scars. Palpation/Percussion Palpation  and Percussion of the abdomen reveal - Soft, Non Tender, No Rebound tenderness, No Rigidity (guarding) and No hepatosplenomegaly. Auscultation Auscultation of the abdomen reveals - Bowel sounds normal.  Neurologic Neurologic evaluation reveals -alert and oriented x 3 with no impairment of recent or remote memory. Mental Status-Normal.  Musculoskeletal Normal Exam - Left-Upper Extremity Strength Normal and Lower Extremity Strength Normal. Normal Exam - Right-Upper Extremity Strength Normal and Lower Extremity Strength Normal.  Lymphatic Head & Neck  General Head & Neck Lymphatics: Bilateral - Description - Normal. Axillary  General Axillary Region: Bilateral - Description - Normal. Tenderness - Non Tender. Femoral & Inguinal  Generalized Femoral & Inguinal Lymphatics: Bilateral - Description - Normal. Tenderness - Non Tender.    Assessment & Plan  MALIGNANT NEOPLASM OF LOWER-INNER QUADRANT OF RIGHT BREAST OF FEMALE, ESTROGEN RECEPTOR POSITIVE (C50.311) Impression: The patient appears to have a small favorable cancer in the LIQ of the right breast. I have talked to her in detail about the options for treatment and she favors lumpectomy. she is a good candidate for sentinel node mapping. She will need a radioactive seed. I have discussed with her the risks and benefits of the surgery as well as some of the technical aspects and she understands and wishes to proceed

## 2018-10-10 NOTE — Anesthesia Procedure Notes (Signed)
Procedure Name: LMA Insertion Date/Time: 10/10/2018 8:24 AM Performed by: Maryella Shivers, CRNA Pre-anesthesia Checklist: Patient identified, Emergency Drugs available, Suction available and Patient being monitored Patient Re-evaluated:Patient Re-evaluated prior to induction Oxygen Delivery Method: Circle system utilized Preoxygenation: Pre-oxygenation with 100% oxygen Induction Type: IV induction Ventilation: Mask ventilation without difficulty LMA: LMA inserted LMA Size: 4.0 Number of attempts: 1 Airway Equipment and Method: Bite block Placement Confirmation: positive ETCO2 Tube secured with: Tape Dental Injury: Teeth and Oropharynx as per pre-operative assessment

## 2018-10-10 NOTE — Discharge Instructions (Signed)
° ° °  NO TYLENOL BEFORE 1 PM TODAY ! ° ° ° ° ° °Post Anesthesia Home Care Instructions ° °Activity: °Get plenty of rest for the remainder of the day. A responsible individual must stay with you for 24 hours following the procedure.  °For the next 24 hours, DO NOT: °-Drive a car °-Operate machinery °-Drink alcoholic beverages °-Take any medication unless instructed by your physician °-Make any legal decisions or sign important papers. ° °Meals: °Start with liquid foods such as gelatin or soup. Progress to regular foods as tolerated. Avoid greasy, spicy, heavy foods. If nausea and/or vomiting occur, drink only clear liquids until the nausea and/or vomiting subsides. Call your physician if vomiting continues. ° °Special Instructions/Symptoms: °Your throat may feel dry or sore from the anesthesia or the breathing tube placed in your throat during surgery. If this causes discomfort, gargle with warm salt water. The discomfort should disappear within 24 hours. ° °If you had a scopolamine patch placed behind your ear for the management of post- operative nausea and/or vomiting: ° °1. The medication in the patch is effective for 72 hours, after which it should be removed.  Wrap patch in a tissue and discard in the trash. Wash hands thoroughly with soap and water. °2. You may remove the patch earlier than 72 hours if you experience unpleasant side effects which may include dry mouth, dizziness or visual disturbances. °3. Avoid touching the patch. Wash your hands with soap and water after contact with the patch. °   ° ° °

## 2018-10-10 NOTE — Anesthesia Preprocedure Evaluation (Addendum)
Anesthesia Evaluation  Patient identified by MRN, date of birth, ID band Patient awake    Reviewed: Allergy & Precautions, NPO status , Patient's Chart, lab work & pertinent test results, reviewed documented beta blocker date and time   Airway Mallampati: II  TM Distance: >3 FB Neck ROM: Full    Dental  (+) Teeth Intact, Dental Advisory Given   Pulmonary neg pulmonary ROS,    Pulmonary exam normal breath sounds clear to auscultation       Cardiovascular negative cardio ROS Normal cardiovascular exam Rhythm:Regular Rate:Normal     Neuro/Psych  Headaches, PSYCHIATRIC DISORDERS Anxiety    GI/Hepatic Neg liver ROS, GERD  Medicated and Controlled,  Endo/Other  Hypothyroidism   Renal/GU negative Renal ROS     Musculoskeletal negative musculoskeletal ROS (+)   Abdominal   Peds  Hematology negative hematology ROS (+)   Anesthesia Other Findings Day of surgery medications reviewed with the patient.  Right breast cancer  Reproductive/Obstetrics                            Anesthesia Physical Anesthesia Plan  ASA: II  Anesthesia Plan: General   Post-op Pain Management:  Regional for Post-op pain   Induction:   PONV Risk Score and Plan: 3 and Midazolam, Dexamethasone and Ondansetron  Airway Management Planned: LMA  Additional Equipment:   Intra-op Plan:   Post-operative Plan:   Informed Consent: I have reviewed the patients History and Physical, chart, labs and discussed the procedure including the risks, benefits and alternatives for the proposed anesthesia with the patient or authorized representative who has indicated his/her understanding and acceptance.   Dental advisory given  Plan Discussed with: CRNA, Anesthesiologist and Surgeon  Anesthesia Plan Comments:        Anesthesia Quick Evaluation

## 2018-10-11 ENCOUNTER — Encounter (HOSPITAL_BASED_OUTPATIENT_CLINIC_OR_DEPARTMENT_OTHER): Payer: Self-pay | Admitting: General Surgery

## 2018-10-14 ENCOUNTER — Telehealth: Payer: Self-pay | Admitting: Family Medicine

## 2018-10-14 DIAGNOSIS — S0992XA Unspecified injury of nose, initial encounter: Secondary | ICD-10-CM

## 2018-10-14 NOTE — Telephone Encounter (Signed)
Referral faxed to [facility] left detailes message on patients voicemail that referral was sent and they will call her to schedule. [place]
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
## 2018-10-14 NOTE — Telephone Encounter (Signed)
Ref done  Will route to [facility] 

## 2018-10-14 NOTE — Addendum Note (Signed)
Addended by: Loura Pardon A on: 10/14/2018 03:55 PM   Modules accepted: Orders

## 2018-10-14 NOTE — Telephone Encounter (Signed)
Pt does want referral to ENT, a very long time ago she saw an ENT near the hospital in [place] but she said she will go to whatever ENT can see her 1st. Pt said her nose is still swollen, and putting on her glasses hurts and when she blows her nose it feels full/swollen on the right side. Please put referral in and I advise pt our [facility] will call her to schedule appt

## 2018-10-14 NOTE — Telephone Encounter (Signed)
What symptoms is she having?  I would refer her to ENT- does she pref [facility] or [facility]?

## 2018-10-17 ENCOUNTER — Emergency Department: Payer: BLUE CROSS/BLUE SHIELD

## 2018-10-17 ENCOUNTER — Emergency Department
Admission: EM | Admit: 2018-10-17 | Discharge: 2018-10-17 | Disposition: A | Discharge status: Home / Self Care | Payer: BLUE CROSS/BLUE SHIELD | Attending: Emergency Medicine | Admitting: Emergency Medicine

## 2018-10-17 ENCOUNTER — Inpatient Hospital Stay: Payer: BLUE CROSS/BLUE SHIELD | Attending: Hematology and Oncology | Admitting: Hematology and Oncology

## 2018-10-17 DIAGNOSIS — Z79899 Other long term (current) drug therapy: Secondary | ICD-10-CM

## 2018-10-17 DIAGNOSIS — C50211 Malignant neoplasm of upper-inner quadrant of right female breast: Secondary | ICD-10-CM | POA: Diagnosis not present

## 2018-10-17 DIAGNOSIS — C50212 Malignant neoplasm of upper-inner quadrant of left female breast: Secondary | ICD-10-CM

## 2018-10-17 DIAGNOSIS — E039 Hypothyroidism, unspecified: Secondary | ICD-10-CM | POA: Diagnosis not present

## 2018-10-17 DIAGNOSIS — R220 Localized swelling, mass and lump, head: Secondary | ICD-10-CM | POA: Diagnosis not present

## 2018-10-17 DIAGNOSIS — R51 Headache: Secondary | ICD-10-CM | POA: Diagnosis not present

## 2018-10-17 DIAGNOSIS — G44301 Post-traumatic headache, unspecified, intractable: Secondary | ICD-10-CM

## 2018-10-17 DIAGNOSIS — Z791 Long term (current) use of non-steroidal anti-inflammatories (NSAID): Secondary | ICD-10-CM

## 2018-10-17 DIAGNOSIS — Z170 Estrogen receptor positive status [ER+]: Secondary | ICD-10-CM | POA: Diagnosis not present

## 2018-10-17 DIAGNOSIS — G44311 Acute post-traumatic headache, intractable: Secondary | ICD-10-CM | POA: Insufficient documentation

## 2018-10-17 DIAGNOSIS — S0993XA Unspecified injury of face, initial encounter: Secondary | ICD-10-CM | POA: Diagnosis not present

## 2018-10-17 DIAGNOSIS — S0990XA Unspecified injury of head, initial encounter: Secondary | ICD-10-CM | POA: Diagnosis not present
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
## 2018-10-17 NOTE — Discharge Instructions (Addendum)
Follow-up with your regular doctor as needed.  See your neurologist tomorrow.  Take over-the-counter Aleve or Advil for pain.  Return to emergency department if worsening.
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
## 2018-10-17 NOTE — Assessment & Plan Note (Signed)
10/10/2018: Right lumpectomy: IDC grade 1, 1.2 cm, with intermediate grade DCIS, margins negative, 0/2 lymph nodes negative, T1CN0 stage Ia ER 100%, PR 90%, Ki-67 10%, HER-2 1+ negative  Pathology counseling: I discussed the final pathology report of the patient provided  a copy of this report. I discussed the margins as well as lymph node surgeries. We also discussed the final staging along with previously performed ER/PR and HER-2/neu testing.  Treatment plan: 1. Oncotype DX testing to determine if chemotherapy would be of any benefit followed by 2. Adjuvant radiation therapy followed by 3. Adjuvant antiestrogen therapy  Return to clinic based upon Oncotype test result

## 2018-10-17 NOTE — Progress Notes (Signed)
Patient Care Team: Tower, [name], MD as PCP - General Nicholas Lose, MD as Consulting Physician (Hematology and Oncology) Kyung Rudd, MD as Consulting Physician (Radiation Oncology) Jovita Kussmaul, MD as Consulting Physician (General Surgery)  DIAGNOSIS:  Encounter Diagnosis  Name Primary?  . Malignant neoplasm of upper-inner quadrant of right breast in female, estrogen receptor positive ([place])     SUMMARY OF ONCOLOGIC HISTORY:   Malignant neoplasm of upper-inner quadrant of right breast in female, estrogen receptor positive ([place])   09/13/2018 Initial Diagnosis    Diagnostic mammogram detected right breast mass with distortion LIQ at 2:00 9 cm from nipple 1.7 cm, at 1:00 there was a benign 1.2 cm fibrocystic change, biopsy of the 2:00 mass revealed grade 1 IDC with DCIS ER 100%, PR 90%, Ki-67 10%, HER-2 1+ negative, T1CN0 stage Ia clinical stage    10/10/2018 Surgery    Right lumpectomy: IDC grade 1, 1.2 cm, with intermediate grade DCIS, margins negative, 0/2 lymph nodes negative, T1CN0 stage Ia ER 100%, PR 90%, Ki-67 10%, HER-2 1+ negative      CHIEF COMPLIANT: Follow-up after recent right lumpectomy  INTERVAL HISTORY: Jennifer [name] is a 62 year old with above-mentioned steroid breast cancer treated with right lumpectomy and is here today to discuss the pathology report.  She is healing recovering very well from recent surgery.  REVIEW OF SYSTEMS:   Constitutional: Denies fevers, chills or abnormal weight loss Eyes: Denies blurriness of vision Ears, nose, mouth, throat, and face: Denies mucositis or sore throat Respiratory: Denies cough, dyspnea or wheezes Cardiovascular: Denies palpitation, chest discomfort Gastrointestinal:  Denies nausea, heartburn or change in bowel habits Skin: Denies abnormal skin rashes Lymphatics: Denies new lymphadenopathy or easy bruising Neurological:Denies numbness, tingling or new weaknesses Behavioral/Psych: Mood is stable, no new changes    Extremities: No lower extremity edema Breast: Recent right lumpectomy All other systems were reviewed with the patient and are negative.  I have reviewed the past medical history, past surgical history, social history and family history with the patient and they are unchanged from previous note.  ALLERGIES:  is allergic to ciprofloxacin; codeine phosphate; decongestant [pseudoephedrine]; flonase [fluticasone propionate]; hydrocodone; reglan [metoclopramide]; topamax [topiramate]; benadryl [diphenhydramine hcl]; and sulfa antibiotics.  MEDICATIONS:  Current Outpatient Medications  Medication Sig Dispense Refill  . ALPRAZolam (XANAX) 0.5 MG tablet TAKE 1/2 TO 1 TABLET BY MOUTH ONCE DAILY AS NEEDED FOR SLEEP/ANXIETY (Patient taking differently: Take 0.25 mg by mouth daily as needed for anxiety. ) 15 tablet 0  . atenolol (TENORMIN) 25 MG tablet Take 12.5 mg by mouth 3 (three) times daily. For migraine prevention    . baclofen (LIORESAL) 10 MG tablet Take 10 mg by mouth daily as needed (migraines).     . Calcium Carbonate-Vitamin D 600-400 MG-UNIT tablet Take 2 tablets by mouth daily.     . cyanocobalamin 2000 MCG tablet Take 2,000 mcg by mouth daily.    . famotidine (PEPCID) 40 MG tablet Take 40 mg by mouth 2 (two) times daily.     . hydrOXYzine (ATARAX/VISTARIL) 25 MG tablet Take 1 tablet (25 mg total) by mouth 3 (three) times daily as needed. (Patient taking differently: Take 25 mg by mouth 2 (two) times daily. ) 30 tablet 0  . ketorolac (TORADOL) 10 MG tablet Take 1 tablet (10 mg total) by mouth every 6 (six) hours as needed. (Patient taking differently: Take 10 mg by mouth every 6 (six) hours as needed for moderate pain. ) 20 tablet 0  .  levocetirizine (XYZAL) 5 MG tablet Take 1 tablet by mouth every evening.  3  . levothyroxine (SYNTHROID, LEVOTHROID) 75 MCG tablet Take 1 tablet (75 mcg total) by mouth daily. 30 tablet 11  . Misc Natural Products (GLUCOSAMINE CHOND COMPLEX/MSM) TABS Take 1  tablet by mouth daily.     . Multiple Vitamin (MULTIVITAMIN WITH MINERALS) TABS tablet Take 1 tablet by mouth daily.    . nabumetone (RELAFEN) 500 MG tablet Take 500 mg by mouth daily as needed (migraines).     . Omega-3 Fatty Acids (FISH OIL) 1000 MG CAPS Take 1,000 mg by mouth daily.     . OnabotulinumtoxinA (BOTOX IJ) Inject 1 Dose as directed every 6 (six) weeks.     Marland Kitchen oxyCODONE (OXY IR/ROXICODONE) 5 MG immediate release tablet Take 1-2 tablets (5-10 mg total) by mouth every 6 (six) hours as needed for moderate pain, severe pain or breakthrough pain. 10 tablet 0  . pantoprazole (PROTONIX) 40 MG tablet TAKE 1 TABLET (40 MG TOTAL) BY MOUTH DAILY. (Patient taking differently: Take 40 mg by mouth daily. ) 30 tablet 11  . Probiotic Product (PROBIOTIC-10 PO) Take 1 capsule by mouth daily.     . promethazine (PHENERGAN) 25 MG tablet Take 12.5 mg by mouth daily as needed for nausea or vomiting. When having a migraine    . rizatriptan (MAXALT) 10 MG tablet Take 10 mg by mouth daily as needed for migraine.   0  . traMADol (ULTRAM) 50 MG tablet Take 1-2 tablets (50-100 mg total) by mouth every 6 (six) hours as needed. 20 tablet 0  . TURMERIC PO Take 1 tablet by mouth daily.      No current facility-administered medications for this visit.     PHYSICAL EXAMINATION: ECOG PERFORMANCE STATUS: 1 - Symptomatic but completely ambulatory  There were no vitals filed for this visit. There were no vitals filed for this visit.  GENERAL:alert, no distress and comfortable SKIN: skin color, texture, turgor are normal, no rashes or significant lesions EYES: normal, Conjunctiva are pink and non-injected, sclera clear OROPHARYNX:no exudate, no erythema and lips, buccal mucosa, and tongue normal  NECK: supple, thyroid normal size, non-tender, without nodularity LYMPH:  no palpable lymphadenopathy in the cervical, axillary or inguinal LUNGS: clear to auscultation and percussion with normal breathing effort HEART:  regular rate & rhythm and no murmurs and no lower extremity edema ABDOMEN:abdomen soft, non-tender and normal bowel sounds MUSCULOSKELETAL:no cyanosis of digits and no clubbing  NEURO: alert & oriented x 3 with fluent speech, no focal motor/sensory deficits EXTREMITIES: No lower extremity edema  LABORATORY DATA:  I have reviewed the data as listed CMP Latest Ref Rng & Units 09/24/2018 09/21/2018 11/08/2017  Glucose 70 - 99 mg/dL 151(H) 98 95  BUN 8 - 23 mg/dL 12 14 15   Creatinine 0.44 - 1.00 mg/dL 0.60 0.83 0.72  Sodium 135 - 145 mmol/L 136 135 136  Potassium 3.5 - 5.1 mmol/L 4.1 4.5 4.3  Chloride 98 - 111 mmol/L 99 99 102  CO2 22 - 32 mmol/L - 29 30  Calcium 8.9 - 10.3 mg/dL - 9.6 9.2  Total Protein 6.5 - 8.1 g/dL - 7.4 7.0  Total Bilirubin 0.3 - 1.2 mg/dL - 0.6 0.6  Alkaline Phos 38 - 126 U/L - 78 54  AST 15 - 41 U/L - 22 21  ALT 0 - 44 U/L - 32 15    Lab Results  Component Value Date   WBC 14.1 (H) 09/21/2018   HGB  12.9 09/24/2018   HCT 38.0 09/24/2018   MCV 86.8 09/21/2018   PLT 300 09/21/2018   NEUTROABS 12.1 (H) 09/21/2018    ASSESSMENT & PLAN:  Malignant neoplasm of upper-inner quadrant of right breast in female, estrogen receptor positive ([place]) 10/10/2018: Right lumpectomy: IDC grade 1, 1.2 cm, with intermediate grade DCIS, margins negative, 0/2 lymph nodes negative, T1CN0 stage Ia ER 100%, PR 90%, Ki-67 10%, HER-2 1+ negative  Pathology counseling: I discussed the final pathology report of the patient provided  a copy of this report. I discussed the margins as well as lymph node surgeries. We also discussed the final staging along with previously performed ER/PR and HER-2/neu testing.  Treatment plan: 1. Oncotype DX testing to determine if chemotherapy would be of any benefit followed by 2. Adjuvant radiation therapy followed by 3. Adjuvant antiestrogen therapy  Return to clinic based upon Oncotype test result    No orders of the defined types were placed in  this encounter.  The patient has a good understanding of the overall plan. she agrees with it. she will call with any problems that may develop before the next visit here.   Harriette Ohara, MD 10/17/18

## 2018-10-18 ENCOUNTER — Telehealth: Payer: Self-pay | Admitting: *Deleted

## 2018-10-18 ENCOUNTER — Telehealth: Payer: Self-pay | Admitting: Hematology and Oncology

## 2018-10-18 DIAGNOSIS — G43719 Chronic migraine without aura, intractable, without status migrainosus: Secondary | ICD-10-CM | POA: Diagnosis not present

## 2018-10-18 DIAGNOSIS — M791 Myalgia, unspecified site: Secondary | ICD-10-CM | POA: Diagnosis not present

## 2018-10-18 DIAGNOSIS — M542 Cervicalgia: Secondary | ICD-10-CM | POA: Diagnosis not present

## 2018-10-18 DIAGNOSIS — G43019 Migraine without aura, intractable, without status migrainosus: Secondary | ICD-10-CM | POA: Diagnosis not present

## 2018-10-18 DIAGNOSIS — G518 Other disorders of facial nerve: Secondary | ICD-10-CM | POA: Diagnosis not present

## 2018-10-18 NOTE — Telephone Encounter (Signed)
Per Dr. Lindi Adie, 11/25 los.  Called patient with Radiation appt date/time.

## 2018-10-18 NOTE — Telephone Encounter (Signed)
Received order for oncotype testing. Requisition faxed to pathology 

## 2018-10-23 HISTORY — PX: OTHER SURGICAL HISTORY: SHX169

## 2018-10-27 DIAGNOSIS — Z170 Estrogen receptor positive status [ER+]: Secondary | ICD-10-CM | POA: Diagnosis not present

## 2018-10-27 DIAGNOSIS — C50211 Malignant neoplasm of upper-inner quadrant of right female breast: Secondary | ICD-10-CM | POA: Diagnosis not present

## 2018-10-27 NOTE — Progress Notes (Signed)
Location of Breast Cancer: Rt Breast  Histology per Pathology Report: 10/10/18  Diagnosis 1. Lymph node, sentinel, biopsy, right axillary #1 - NO CARCINOMA IDENTIFIED IN ONE LYMPH NODE (0/1) 2. Lymph node, sentinel, biopsy, right axillary #2 - NO CARCINOMA IDENTIFIED IN ONE LYMPH NODE (0/1) 3. Breast, lumpectomy, right with radioactive seed - INVASIVE DUCTAL CARCINOMA, NOTTINGHAM GRADE 1 OF 3, 1.2 CM - DUCTAL CARCINOMA IN SITU, INTERMEDIATE GRADE - MARGINS UNINVOLVED BY CARCINOMA (0.1 CM; SUPERIOR MARGIN) - PREVIOUS BIOPSY SITE CHANGES PRESENT - SEE ONCOLOGY TABLE BELOW 4. Breast, excision, right additional medial margin - NO RESIDUAL CARCINOMA IDENTIFIED 5. Breast, excision, right additional medial superior margin - NO RESIDUAL CARCINOMA IDENTIFIED  Receptor Status:10/10/18  Estrogen Receptor: Positive (100%, strong) Progesterone Receptor: Positive (90%, strong) HER2: Negative ([facility]; 1+) ki-67: 10%  Did patient present with symptoms (if so, please note symptoms) or was this found on screening mammography?: Yes  Past/Anticipated interventions by surgeon, if any: 10/10/18 Dr. Marlou Starks   RIGHT BREAST LUMPECTOMY WITH RADIOACTIVE SEED AND RIGHT SENTINEL LYMPH NODE BIOPSY  Past/Anticipated interventions by medical oncology, if any:  Lymphedema issues, if any:  None  Pain issues, if any:  None  SAFETY ISSUES:  Prior radiation? No  Pacemaker/ICD? no  Possible current pregnancy?No  Is the patient on methotrexate? No  Current Complaints / other details:      Mardene Sayer, LPN 97/03/8831,54:98 PM Vitals:   11/01/18 1012  BP: 105/64  Pulse: 66  Resp: 20  Temp: 98.3 F (36.8 C)  TempSrc: Oral  SpO2: 100%  Weight: 127 lb 9.6 oz (57.9 kg)  Height: 5' 1"  (1.549 m)   Wt Readings from Last 3 Encounters:  11/01/18 127 lb 9.6 oz (57.9 kg)  10/31/18 126 lb (57.2 kg)  10/17/18 125 lb 1.6 oz (56.7 kg)

## 2018-10-28 ENCOUNTER — Telehealth: Payer: Self-pay | Admitting: *Deleted

## 2018-10-28 NOTE — Telephone Encounter (Signed)
Received oncotype results of 29/18%.  Spoke with patient and she will see Dr. Lindi Adie 10/31/18 at 11:15am to discuss oncotype results.

## 2018-10-31 ENCOUNTER — Encounter: Payer: Self-pay | Admitting: Hematology and Oncology

## 2018-10-31 ENCOUNTER — Telehealth: Payer: Self-pay | Admitting: Hematology and Oncology

## 2018-10-31 ENCOUNTER — Inpatient Hospital Stay: Payer: BLUE CROSS/BLUE SHIELD | Attending: Hematology and Oncology | Admitting: Hematology and Oncology

## 2018-10-31 DIAGNOSIS — C50211 Malignant neoplasm of upper-inner quadrant of right female breast: Secondary | ICD-10-CM

## 2018-10-31 DIAGNOSIS — Z79899 Other long term (current) drug therapy: Secondary | ICD-10-CM | POA: Diagnosis not present

## 2018-10-31 DIAGNOSIS — Z170 Estrogen receptor positive status [ER+]: Secondary | ICD-10-CM | POA: Diagnosis not present

## 2018-10-31 NOTE — Progress Notes (Signed)
Patient Care Team: Tower, [name], MD as PCP - General Nicholas Lose, MD as Consulting Physician (Hematology and Oncology) Kyung Rudd, MD as Consulting Physician (Radiation Oncology) Jovita Kussmaul, MD as Consulting Physician (General Surgery)  DIAGNOSIS:  Encounter Diagnosis  Name Primary?  . Malignant neoplasm of upper-inner quadrant of right breast in female, estrogen receptor positive ([place])     SUMMARY OF ONCOLOGIC HISTORY:   Malignant neoplasm of upper-inner quadrant of right breast in female, estrogen receptor positive ([place])   09/13/2018 Initial Diagnosis    Diagnostic mammogram detected right breast mass with distortion LIQ at 2:00 9 cm from nipple 1.7 cm, at 1:00 there was a benign 1.2 cm fibrocystic change, biopsy of the 2:00 mass revealed grade 1 IDC with DCIS ER 100%, PR 90%, Ki-67 10%, HER-2 1+ negative, T1CN0 stage Ia clinical stage    10/10/2018 Surgery    Right lumpectomy: IDC grade 1, 1.2 cm, with intermediate grade DCIS, margins negative, 0/2 lymph nodes negative, T1CN0 stage Ia ER 100%, PR 90%, Ki-67 10%, HER-2 1+ negative     10/10/2018 Oncotype testing    oncotype results of 29: Risk of recurrence without chemo 18%     CHIEF COMPLIANT: Follow-up to discuss Oncotype test results  INTERVAL HISTORY: Jennifer [name] is a 62 year old with above-mentioned history of right breast cancer underwent lumpectomy and Oncotype testing.  Oncotype score came back as 29 which translates into 18% risk of recurrence. Patient has recovered very well from the surgery.  REVIEW OF SYSTEMS:   Constitutional: Denies fevers, chills or abnormal weight loss Eyes: Denies blurriness of vision Ears, nose, mouth, throat, and face: Denies mucositis or sore throat Respiratory: Denies cough, dyspnea or wheezes Cardiovascular: Denies palpitation, chest discomfort Gastrointestinal:  Denies nausea, heartburn or change in bowel habits Skin: Denies abnormal skin rashes Lymphatics: Denies new  lymphadenopathy or easy bruising Neurological:Denies numbness, tingling or new weaknesses Behavioral/Psych: Mood is stable, no new changes  Extremities: No lower extremity edema  All other systems were reviewed with the patient and are negative.  I have reviewed the past medical history, past surgical history, social history and family history with the patient and they are unchanged from previous note.  ALLERGIES:  is allergic to ciprofloxacin; codeine phosphate; decongestant [pseudoephedrine]; flonase [fluticasone propionate]; hydrocodone; reglan [metoclopramide]; topamax [topiramate]; benadryl [diphenhydramine hcl]; and sulfa antibiotics.  MEDICATIONS:  Current Outpatient Medications  Medication Sig Dispense Refill  . ALPRAZolam (XANAX) 0.5 MG tablet TAKE 1/2 TO 1 TABLET BY MOUTH ONCE DAILY AS NEEDED FOR SLEEP/ANXIETY (Patient taking differently: Take 0.25 mg by mouth daily as needed for anxiety. ) 15 tablet 0  . atenolol (TENORMIN) 25 MG tablet Take 12.5 mg by mouth 3 (three) times daily. For migraine prevention    . baclofen (LIORESAL) 10 MG tablet Take 10 mg by mouth daily as needed (migraines).     . Calcium Carbonate-Vitamin D 600-400 MG-UNIT tablet Take 2 tablets by mouth daily.     . cyanocobalamin 2000 MCG tablet Take 2,000 mcg by mouth daily.    . famotidine (PEPCID) 40 MG tablet Take 40 mg by mouth 2 (two) times daily.     . hydrOXYzine (ATARAX/VISTARIL) 25 MG tablet Take 1 tablet (25 mg total) by mouth 3 (three) times daily as needed. (Patient taking differently: Take 25 mg by mouth 2 (two) times daily. ) 30 tablet 0  . ketorolac (TORADOL) 10 MG tablet Take 1 tablet (10 mg total) by mouth every 6 (six) hours as needed. (Patient  taking differently: Take 10 mg by mouth every 6 (six) hours as needed for moderate pain. ) 20 tablet 0  . levocetirizine (XYZAL) 5 MG tablet Take 1 tablet by mouth every evening.  3  . levothyroxine (SYNTHROID, LEVOTHROID) 75 MCG tablet Take 1 tablet (75  mcg total) by mouth daily. 30 tablet 11  . Misc Natural Products (GLUCOSAMINE CHOND COMPLEX/MSM) TABS Take 1 tablet by mouth daily.     . Multiple Vitamin (MULTIVITAMIN WITH MINERALS) TABS tablet Take 1 tablet by mouth daily.    . nabumetone (RELAFEN) 500 MG tablet Take 500 mg by mouth daily as needed (migraines).     . Omega-3 Fatty Acids (FISH OIL) 1000 MG CAPS Take 1,000 mg by mouth daily.     . OnabotulinumtoxinA (BOTOX IJ) Inject 1 Dose as directed every 6 (six) weeks.     Marland Kitchen oxyCODONE (OXY IR/ROXICODONE) 5 MG immediate release tablet Take 1-2 tablets (5-10 mg total) by mouth every 6 (six) hours as needed for moderate pain, severe pain or breakthrough pain. 10 tablet 0  . pantoprazole (PROTONIX) 40 MG tablet TAKE 1 TABLET (40 MG TOTAL) BY MOUTH DAILY. (Patient taking differently: Take 40 mg by mouth daily. ) 30 tablet 11  . Probiotic Product (PROBIOTIC-10 PO) Take 1 capsule by mouth daily.     . promethazine (PHENERGAN) 25 MG tablet Take 12.5 mg by mouth daily as needed for nausea or vomiting. When having a migraine    . rizatriptan (MAXALT) 10 MG tablet Take 10 mg by mouth daily as needed for migraine.   0  . traMADol (ULTRAM) 50 MG tablet Take 1-2 tablets (50-100 mg total) by mouth every 6 (six) hours as needed. 20 tablet 0  . TURMERIC PO Take 1 tablet by mouth daily.      No current facility-administered medications for this visit.     PHYSICAL EXAMINATION: ECOG PERFORMANCE STATUS: 1 - Symptomatic but completely ambulatory  Vitals:   10/31/18 1419  BP: 140/85  Pulse: 73  Resp: 18  Temp: 97.7 F (36.5 C)  SpO2: 99%   Filed Weights   10/31/18 1419  Weight: 126 lb (57.2 kg)    GENERAL:alert, no distress and comfortable SKIN: skin color, texture, turgor are normal, no rashes or significant lesions EYES: normal, Conjunctiva are pink and non-injected, sclera clear OROPHARYNX:no exudate, no erythema and lips, buccal mucosa, and tongue normal  NECK: supple, thyroid normal size,  non-tender, without nodularity LYMPH:  no palpable lymphadenopathy in the cervical, axillary or inguinal LUNGS: clear to auscultation and percussion with normal breathing effort HEART: regular rate & rhythm and no murmurs and no lower extremity edema ABDOMEN:abdomen soft, non-tender and normal bowel sounds MUSCULOSKELETAL:no cyanosis of digits and no clubbing  NEURO: alert & oriented x 3 with fluent speech, no focal motor/sensory deficits EXTREMITIES: No lower extremity edema   LABORATORY DATA:  I have reviewed the data as listed CMP Latest Ref Rng & Units 09/24/2018 09/21/2018 11/08/2017  Glucose 70 - 99 mg/dL 151(H) 98 95  BUN 8 - 23 mg/dL _0 Creatinine 0.44 - 1.00 mg/dL 0.60 0.83 0.72  Sodium 135 - 145 mmol/L 136 135 136  Potassium 3.5 - 5.1 mmol/L 4.1 4.5 4.3  Chloride 98 - 111 mmol/L 99 99 102  CO2 22 - 32 mmol/L - 29 30  Calcium 8.9 - 10.3 mg/dL - 9.6 9.2  Total Protein 6.5 - 8.1 g/dL - 7.4 7.0  Total Bilirubin 0.3 - 1.2 mg/dL - 0.6 0.6  Alkaline Phos 38 - 126 U/L - 78 54  AST 15 - 41 U/L - 22 21  ALT 0 - 44 U/L - 32 15    Lab Results  Component Value Date   WBC 14.1 (H) 09/21/2018   HGB 12.9 09/24/2018   HCT 38.0 09/24/2018   MCV 86.8 09/21/2018   PLT 300 09/21/2018   NEUTROABS 12.1 (H) 09/21/2018    ASSESSMENT & PLAN:  Malignant neoplasm of upper-inner quadrant of right breast in female, estrogen receptor positive ([place]) 10/10/2018: Right lumpectomy: IDC grade 1, 1.2 cm, with intermediate grade DCIS, margins negative, 0/2 lymph nodes negative, T1CN0 stage Ia ER 100%, PR 90%, Ki-67 10%, HER-2 1+ negative Oncotype DX: 29: 18% risk of recurrence  Treatment plan: 1.   I recommended systemic adjuvant chemotherapy with Taxotere and Cytoxan every 3 weeks x4 cycles 2. Adjuvant radiation therapy followed by 3. Adjuvant antiestrogen therapy  Patient however is not keen on going through chemotherapy. She will inform me tomorrow regarding her decision.  She has an  appointment to see radiation oncology tomorrow.  She will inform them if she plans to do chemo then we will have to do chemo before radiation.  Return to clinic at the end of radiation  No orders of the defined types were placed in this encounter.  The patient has a good understanding of the overall plan. she agrees with it. she will call with any problems that may develop before the next visit here.   Harriette Ohara, MD 10/31/18

## 2018-10-31 NOTE — Telephone Encounter (Signed)
Gave patient copy of appointment for December. No 12/9 los/orders/referrals.

## 2018-10-31 NOTE — Assessment & Plan Note (Signed)
10/10/2018: Right lumpectomy: IDC grade 1, 1.2 cm, with intermediate grade DCIS, margins negative, 0/2 lymph nodes negative, T1CN0 stage Ia ER 100%, PR 90%, Ki-67 10%, HER-2 1+ negative Oncotype DX: 29: 18% risk of recurrence  Treatment plan: 1.   Adjuvant chemotherapy with dose dense Adriamycin and Cytoxan x4 followed by Taxol weekly x12 2. Adjuvant radiation therapy followed by 3. Adjuvant antiestrogen therapy  Return to clinic in 2 weeks to start chemotherapy  UPBEAT clinical trial ([place]): Newly diagnosed stage I to III breast cancer patients receiving either adjuvant or neoadjuvant chemotherapy undergo cardiac MRI before treatment and at 24 months along with neurocognitive testing, exercise and disability measures at baseline 3, 12 and 24 months.  Return to clinic on day 1 cycle 1 of chemo.

## 2018-11-01 ENCOUNTER — Ambulatory Visit
Admit: 2018-11-01 | Discharge: 2018-11-01 | Disposition: A | Discharge status: Home / Self Care | Payer: BLUE CROSS/BLUE SHIELD | Attending: Radiation Oncology | Admitting: Radiation Oncology

## 2018-11-01 ENCOUNTER — Encounter: Payer: Self-pay | Admitting: Radiation Oncology

## 2018-11-01 ENCOUNTER — Ambulatory Visit
Admission: RE | Admit: 2018-11-01 | Discharge: 2018-11-01 | Disposition: A | Discharge status: Home / Self Care | Payer: BLUE CROSS/BLUE SHIELD | Source: Ambulatory Visit | Attending: Radiation Oncology | Admitting: Radiation Oncology

## 2018-11-01 ENCOUNTER — Other Ambulatory Visit: Payer: Self-pay

## 2018-11-01 VITALS — BP 105/64 | HR 66 | Temp 98.3°F | Resp 20 | Ht 61.0 in | Wt 127.6 lb

## 2018-11-01 DIAGNOSIS — C50211 Malignant neoplasm of upper-inner quadrant of right female breast: Secondary | ICD-10-CM | POA: Diagnosis not present

## 2018-11-01 DIAGNOSIS — Z170 Estrogen receptor positive status [ER+]: Secondary | ICD-10-CM

## 2018-11-01 DIAGNOSIS — Z803 Family history of malignant neoplasm of breast: Secondary | ICD-10-CM | POA: Diagnosis not present

## 2018-11-01 DIAGNOSIS — R7303 Prediabetes: Secondary | ICD-10-CM | POA: Diagnosis not present

## 2018-11-01 DIAGNOSIS — Z79899 Other long term (current) drug therapy: Secondary | ICD-10-CM | POA: Insufficient documentation

## 2018-11-01 DIAGNOSIS — Z9889 Other specified postprocedural states: Secondary | ICD-10-CM | POA: Diagnosis not present
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
## 2018-11-02 ENCOUNTER — Ambulatory Visit
Admission: RE | Admit: 2018-11-02 | Discharge: 2018-11-02 | Disposition: A | Discharge status: Home / Self Care | Payer: BLUE CROSS/BLUE SHIELD | Source: Ambulatory Visit | Attending: Radiation Oncology | Admitting: Radiation Oncology

## 2018-11-02 ENCOUNTER — Telehealth: Payer: Self-pay | Admitting: Hematology and Oncology

## 2018-11-02 ENCOUNTER — Inpatient Hospital Stay: Payer: BLUE CROSS/BLUE SHIELD

## 2018-11-02 ENCOUNTER — Telehealth: Payer: Self-pay

## 2018-11-02 ENCOUNTER — Inpatient Hospital Stay (HOSPITAL_BASED_OUTPATIENT_CLINIC_OR_DEPARTMENT_OTHER): Payer: BLUE CROSS/BLUE SHIELD | Admitting: Genetic Counselor

## 2018-11-02 DIAGNOSIS — Z803 Family history of malignant neoplasm of breast: Secondary | ICD-10-CM | POA: Insufficient documentation

## 2018-11-02 DIAGNOSIS — C50211 Malignant neoplasm of upper-inner quadrant of right female breast: Secondary | ICD-10-CM

## 2018-11-02 DIAGNOSIS — C50311 Malignant neoplasm of lower-inner quadrant of right female breast: Secondary | ICD-10-CM | POA: Diagnosis not present

## 2018-11-02 DIAGNOSIS — Z483 Aftercare following surgery for neoplasm: Secondary | ICD-10-CM | POA: Diagnosis not present

## 2018-11-02 DIAGNOSIS — Z170 Estrogen receptor positive status [ER+]: Secondary | ICD-10-CM | POA: Insufficient documentation

## 2018-11-02 DIAGNOSIS — M25611 Stiffness of right shoulder, not elsewhere classified: Secondary | ICD-10-CM | POA: Diagnosis not present

## 2018-11-02 DIAGNOSIS — R293 Abnormal posture: Secondary | ICD-10-CM | POA: Diagnosis not present

## 2018-11-02 DIAGNOSIS — Z510 Encounter for antineoplastic radiation therapy: Secondary | ICD-10-CM | POA: Insufficient documentation

## 2018-11-02 NOTE — Progress Notes (Signed)
REFERRING PROVIDER: Nicholas Lose, MD [place], [place] [address]  PRIMARY PROVIDER:  Abner Greenspan, MD  PRIMARY REASON FOR VISIT:  1. Malignant neoplasm of upper-inner quadrant of right breast in female, estrogen receptor positive ([place])   2. Family history of breast cancer      HISTORY OF PRESENT ILLNESS:   Jennifer [name], a 62 y.o. female, was seen for a [facility] cancer genetics consultation at the request of Dr. Lindi Adie due to a personal and family history of cancer.  Jennifer [name] presents to clinic today to discuss the possibility of a hereditary predisposition to cancer, genetic testing, and to further clarify her future cancer risks, as well as potential cancer risks for family members.   In November 2019, at the age of 72, Jennifer [name] was diagnosed with cancer of the right breast. This was treated with lumpectomy and radiation.  She has been conflicted with whether to have chemotherapy and decided not to pursue chemo even though her oncotype (29 by report) suggests that she may want to consider it. She came in to discuss genetic testing.     CANCER HISTORY:    Malignant neoplasm of upper-inner quadrant of right breast in female, estrogen receptor positive ([place])   09/13/2018 Initial Diagnosis    Diagnostic mammogram detected right breast mass with distortion LIQ at 2:00 9 cm from nipple 1.7 cm, at 1:00 there was a benign 1.2 cm fibrocystic change, biopsy of the 2:00 mass revealed grade 1 IDC with DCIS ER 100%, PR 90%, Ki-67 10%, HER-2 1+ negative, T1CN0 stage Ia clinical stage    10/10/2018 Surgery    Right lumpectomy: IDC grade 1, 1.2 cm, with intermediate grade DCIS, margins negative, 0/2 lymph nodes negative, T1CN0 stage Ia ER 100%, PR 90%, Ki-67 10%, HER-2 1+ negative     10/10/2018 Oncotype testing    oncotype results of 29: Risk of recurrence without chemo 18%      HORMONAL RISK FACTORS:  Menarche was at age 56.  First live birth at age N/A.  OCP use  for approximately 2 years.  Ovaries intact: yes.  Hysterectomy: yes.  Menopausal status: postmenopausal.  HRT use: 8 years. Colonoscopy: yes; normal. Mammogram within the last year: yes. Number of breast biopsies: 1. Up to date with pelvic exams:  yes. Any excessive radiation exposure in the past:  no  Past Medical History:  Diagnosis Date  . Allergic rhinitis   . Anxiety   . Arthritis    hands, knees  . Cancer ([place]) 09/2018   Breast CA new DX, right breast  . Cervical dysplasia   . Endometriosis   . Esophageal reflux   . Family history of breast cancer   . Hypothyroid   . Migraine with aura, without mention of intractable migraine without mention of status migrainosus   . Osteopenia 11/2016   T score -2.2 FRAX 9.9%/1.6%  . Pre-diabetes     Past Surgical History:  Procedure Laterality Date  . BREAST EXCISIONAL BIOPSY Bilateral over 10 years ago   benign x 2   . BREAST LUMPECTOMY WITH RADIOACTIVE SEED AND SENTINEL LYMPH NODE BIOPSY Right 10/10/2018   Procedure: RIGHT BREAST LUMPECTOMY WITH RADIOACTIVE SEED AND RIGHT SENTINEL LYMPH NODE BIOPSY;  Surgeon: Jovita Kussmaul, MD;  Location: [facility];  Service: General;  Laterality: Right;  . BREAST SURGERY     Benign breast lump excised  . CERVICAL CONE BIOPSY  1985   severe dysplasia  . COLONOSCOPY  2005   normal  . COLPOSCOPY    . ROTATOR CUFF REPAIR  08/2008    Social History   Socioeconomic History  . Marital status: Widowed    Spouse name: Not on file  . Number of children: Not on file  . Years of education: Not on file  . Highest education level: Not on file  Occupational History  . 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 Financial resource strain: Not on file  . Food insecurity:    Worry: Not on file    Inability: Not on file  . Transportation needs:    Medical: Not on file    Non-medical: Not on file  Tobacco Use  . Smoking status: Never Smoker  . Smokeless tobacco: Never Used   Substance and Sexual Activity  . Alcohol use: No    Alcohol/week: 0.0 standard drinks  . Drug use: No  . Sexual activity: Not Currently    Birth control/protection: Post-menopausal    Comment: 1st intercourse 54 yo-5 partners  Lifestyle  . Physical activity:    Days per week: Not on file    Minutes per session: Not on file  . Stress: Not on file  Relationships  . Social connections:    Talks on phone: Not on file    Gets together: Not on file    Attends religious service: Not on file    Active member of club or organization: Not on file    Attends meetings of clubs or organizations: Not on file    Relationship status: Not on file  Other Topics Concern  . 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     FAMILY HISTORY:  We obtained a detailed, 4-generation family history.  Significant diagnoses are listed below: Family History  Problem Relation Age of Onset  . Breast cancer Mother 50  . Diabetes Mother   . Hypertension Father   . Heart disease Father   . Osteoporosis Maternal Grandmother   . Diabetes Maternal Grandmother   . Hypertension Sister   . COPD Paternal Grandmother   . Heart disease Paternal Grandfather   . Breast cancer Cousin 17       pat first cousin  . Breast cancer Cousin        mat second cousin with breast cancer in her 61s    The patient does not have children.  She has one brother and two sisters.  None of her siblings have had cancer.  The patient's mother is living and her father is deceased.  The patient's mother was diagnosed with breast cancer at 51.  She is an only child.  There is no information on her father's family and her mother died at 68.  Her mother had several siblings, some had cancer but the patient is not sure which ones had which cancers.  One sister has a granddaughter who had breast cancer in her 78's (the patient's second cousin).  The  patient's father is deceased from heart failure.  He had a brother and sister.  The sister has a grandson with end stage lung cancer. The brother has a daughter who had breast cancer at 37. The paternal grandparents are deceased from non cancer related issues.  Jennifer [name] is unaware of previous family history of genetic testing for hereditary cancer risks. Patient's maternal ancestors are of [place] and [place] descent, and paternal ancestors are of [place]  and [place] descent. There is no reported Ashkenazi Jewish ancestry. There is no known consanguinity.  GENETIC COUNSELING ASSESSMENT: Jennifer [name] is a 62 y.o. female with a personal and family history of breast cancer which is somewhat suggestive of a familial cancer history pattern and predisposition to cancer. We, therefore, discussed and recommended the following at today's visit.   DISCUSSION: We discussed that the patient's family history is suggestive of a familial cancer history rather than a hereditary cancer syndrome.  We discussed that about 5-10% of breast cancer is hereditary with most cases due to BRCA mutations.  There are other genes that have a moderate risk for breast cancer than can also cause hereditary syndromes, such as ATM, CHEK2 and PALB2.  The patient and her mother, as well as her paternal cousin all have post menopausal breast cancer.  While there is a family history, there is not a strong history, and all cancer is of typical age of onset, which is most suggestive of a familial pattern and not a hereditary pattern.  We reviewed that if there are other cancers in her MGM's generation this could change our recommendation.  The second cousin with breast cancer in her 15's is somewhat concerning and she should contact this cousin to see if she had genetic testing.  IF she did and it was positive then we should make sure that Jennifer [name] undergo genetic testing for this gene.  If she learns that there are other cancers in her MGM's  generation that would allow her to meet criteria, regardless of her second cousin's test results we could offer her testing.  We reviewed the characteristics, features and inheritance patterns of hereditary cancer syndromes. We also discussed genetic testing, including the appropriate family members to test, the process of testing, insurance coverage and turn-around-time for results. We discussed the implications of a negative, positive and/or variant of uncertain significant result. We recommended Jennifer [name] pursue genetic testing for the common hereditary cancer gene panel. The Hereditary Gene Panel offered by [facility] includes sequencing and/or deletion duplication testing of the following 47 genes: APC, ATM, AXIN2, BARD1, BMPR1A, BRCA1, BRCA2, BRIP1, CDH1, CDK4, CDKN2A (p14ARF), CDKN2A (p16INK4a), CHEK2, CTNNA1, DICER1, EPCAM (Deletion/duplication testing only), GREM1 (promoter region deletion/duplication testing only), KIT, MEN1, MLH1, MSH2, MSH3, MSH6, MUTYH, NBN, NF1, NHTL1, PALB2, PDGFRA, PMS2, POLD1, POLE, PTEN, RAD50, RAD51C, RAD51D, SDHB, SDHC, SDHD, SMAD4, SMARCA4. STK11, TP53, TSC1, TSC2, and VHL.  The following genes were evaluated for sequence changes only: SDHA and HOXB13 c.251G>A variant only.   Based on Ms. Illescas's personal and family history of cancer given today, she does not meet medical criteria for genetic testing. Despite not meeting criteria, genetic testing could still be pursued for $250.  If she learns more about her family history and finds that she meets criteria we could go through insurance for testing.  Despite that she would meet criteria, she may still have an out of pocket cost. We discussed that if her out of pocket cost for testing is over $100, the laboratory will call and confirm whether she wants to proceed with testing.  If the out of pocket cost of testing is less than $100 she will be billed by the genetic testing laboratory.   PLAN: Despite our recommendation, Jennifer [name]  did not wish to pursue genetic testing at today's visit. We understand this decision, and remain available to coordinate genetic testing at any time in the future. We, therefore, recommend Jennifer [name] continue to follow the cancer screening  guidelines given by her primary healthcare provider.  Based on Jennifer [name]'s family history, we recommended her second cousin, who was diagnosed with breast cancer at age 56-35, have genetic counseling and testing. Jennifer [name] will let us know if we can be of any assistance in coordinating genetic counseling and/or testing for this family member.   Lastly, we encouraged Jennifer [name] to remain in contact with cancer genetics annually so that we can continuously update the family history and inform her of any changes in cancer genetics and testing that may be of benefit for this family.   Ms.  [name] questions were answered to her satisfaction today. Our contact information was provided should additional questions or concerns arise. Thank you for the referral and allowing [place] to share in the care of your patient.   Temima Kutsch P. Florene Glen, [place], [facility] Certified Genetic Counselor [email or web address] phone: [phone]  The patient was seen for a total of 60 minutes in face-to-face genetic counseling.  This patient was discussed with Drs. Magrinat, Lindi Adie and/or Burr Medico who agrees with the above.    _______________________________________________________________________ For Office Staff:  Number of people involved in session: 1 Was an Intern/ student involved with case: no

## 2018-11-02 NOTE — Progress Notes (Signed)
  Radiation Oncology         [phone] ________________________________  Name: Jennifer [name] MRN: [id number]  Date: 11/02/2018  DOB: [birth year]   DIAGNOSIS:     ICD-10-CM   1. Malignant neoplasm of upper-inner quadrant of right breast in female, estrogen receptor positive ([place]) C50.211    Z17.0     SIMULATION AND TREATMENT PLANNING NOTE  The patient presented for simulation prior to beginning her course of radiation treatment for her diagnosis of right-sided breast cancer. The patient was placed in a supine position on a breast board. A customized vac-lock bag was constructed and this complex treatment device will be used on a daily basis during her treatment. In this fashion, a CT scan was obtained through the chest area and an isocenter was placed near the chest wall within the breast.  The patient will be planned to receive a course of radiation initially to a dose of 42.56 Gy. This will consist of a whole breast radiotherapy technique. To accomplish this, 2 customized blocks have been designed which will correspond to medial and lateral whole breast tangent fields. This treatment will be accomplished at 2.66 Gy per fraction. A forward planning technique will also be evaluated to determine if this approach improves the plan. It is anticipated that the patient will then receive a 8 Gy boost to the seroma cavity which has been contoured. This will be accomplished at 2 Gy per fraction.   This initial treatment will consist of a 3-D conformal technique. The seroma has been contoured as the primary target structure. Additionally, dose volume histograms of both this target as well as the lungs and heart will also be evaluated. Such an approach is necessary to ensure that the target area is adequately covered while the nearby critical  normal structures are adequately spared.  Plan:  The final anticipated total dose therefore will correspond to 50.56  Gy.    _______________________________   Jodelle Gross, MD, PhD

## 2018-11-02 NOTE — Progress Notes (Signed)
  Radiation Oncology         [phone] ________________________________  Name: Jennifer [name] MRN: [id number]  Date: 11/02/2018  DOB: 1956/07/14  Optical Surface Tracking Plan:  Since intensity modulated radiotherapy (IMRT) and 3D conformal radiation treatment methods are predicated on accurate and precise positioning for treatment, intrafraction motion monitoring is medically necessary to ensure accurate and safe treatment delivery.  The ability to quantify intrafraction motion without excessive ionizing radiation dose can only be performed with optical surface tracking. Accordingly, surface imaging offers the opportunity to obtain 3D measurements of patient position throughout IMRT and 3D treatments without excessive radiation exposure.  I am ordering optical surface tracking for this patient's upcoming course of radiotherapy. ________________________________  Kyung Rudd, MD 11/02/2018 1:32 PM    Reference:   Ursula Alert, J, et al. Surface imaging-based analysis of intrafraction motion for breast radiotherapy patients.Journal of [place], n. 6, nov. 2014. ISSN [id number].   Available at: <http://www.jacmp.org/index.php/jacmp/article/view/4957>.

## 2018-11-02 NOTE — Telephone Encounter (Signed)
Scheduled appt per 12/11 sch message - sent reminder letter in the mail

## 2018-11-02 NOTE — Telephone Encounter (Signed)
Vivian nurse with [facility] said that pt just enrolled in case mgt program to see pt needs are met and any medical issues that might need resources. Adonis Huguenin said if Dr Glori Bickers has anything Adonis Huguenin could help pt with to please call direct phone # left with this message. FYI to Dr Glori Bickers.

## 2018-11-03 ENCOUNTER — Encounter: Payer: Self-pay | Admitting: General Practice

## 2018-11-03 ENCOUNTER — Other Ambulatory Visit: Payer: Self-pay

## 2018-11-03 ENCOUNTER — Emergency Department
Admission: EM | Admit: 2018-11-03 | Discharge: 2018-11-03 | Disposition: A | Discharge status: Home / Self Care | Payer: BLUE CROSS/BLUE SHIELD | Attending: Emergency Medicine | Admitting: Emergency Medicine

## 2018-11-03 DIAGNOSIS — E039 Hypothyroidism, unspecified: Secondary | ICD-10-CM | POA: Diagnosis not present

## 2018-11-03 DIAGNOSIS — C50311 Malignant neoplasm of lower-inner quadrant of right female breast: Secondary | ICD-10-CM | POA: Diagnosis not present

## 2018-11-03 DIAGNOSIS — M25611 Stiffness of right shoulder, not elsewhere classified: Secondary | ICD-10-CM | POA: Diagnosis not present

## 2018-11-03 DIAGNOSIS — R7303 Prediabetes: Secondary | ICD-10-CM | POA: Diagnosis not present

## 2018-11-03 DIAGNOSIS — G43009 Migraine without aura, not intractable, without status migrainosus: Secondary | ICD-10-CM | POA: Diagnosis not present

## 2018-11-03 DIAGNOSIS — G43909 Migraine, unspecified, not intractable, without status migrainosus: Secondary | ICD-10-CM | POA: Diagnosis not present

## 2018-11-03 DIAGNOSIS — C50211 Malignant neoplasm of upper-inner quadrant of right female breast: Secondary | ICD-10-CM | POA: Diagnosis not present

## 2018-11-03 DIAGNOSIS — R51 Headache: Secondary | ICD-10-CM | POA: Diagnosis not present

## 2018-11-03 DIAGNOSIS — Z79899 Other long term (current) drug therapy: Secondary | ICD-10-CM | POA: Diagnosis not present

## 2018-11-03 DIAGNOSIS — R293 Abnormal posture: Secondary | ICD-10-CM | POA: Diagnosis not present

## 2018-11-03 DIAGNOSIS — Z483 Aftercare following surgery for neoplasm: Secondary | ICD-10-CM | POA: Diagnosis not present

## 2018-11-03 DIAGNOSIS — Z170 Estrogen receptor positive status [ER+]: Secondary | ICD-10-CM | POA: Diagnosis not present

## 2018-11-03 MED ORDER — ONDANSETRON HCL 4 MG/2ML IJ SOLN
4.0000 mg | Freq: Once | INTRAMUSCULAR | Status: AC
  Administered 2018-11-03: 4 mg via INTRAVENOUS
  Filled 2018-11-03: qty 2

## 2018-11-03 MED ORDER — DEXAMETHASONE SODIUM PHOSPHATE 10 MG/ML IJ SOLN
10.0000 mg | Freq: Once | INTRAMUSCULAR | Status: AC
  Administered 2018-11-03: 10 mg via INTRAVENOUS

## 2018-11-03 MED ORDER — ONDANSETRON 4 MG PO TBDP: 4.0000 mg | ORAL_TABLET | Freq: Three times a day (TID) | ORAL | 0 refills | Status: DC | PRN

## 2018-11-03 MED ORDER — RIZATRIPTAN BENZOATE 10 MG PO TABS: 10.0000 mg | ORAL_TABLET | Freq: Once | ORAL | 0 refills | Status: DC | PRN

## 2018-11-03 MED ORDER — METHYLPREDNISOLONE SODIUM SUCC 125 MG IJ SOLR
125.0000 mg | Freq: Once | INTRAMUSCULAR | Status: DC
  Filled 2018-11-03: qty 2

## 2018-11-03 MED ORDER — KETOROLAC TROMETHAMINE 30 MG/ML IJ SOLN
30.0000 mg | Freq: Once | INTRAMUSCULAR | Status: AC
  Administered 2018-11-03: 30 mg via INTRAVENOUS
  Filled 2018-11-03: qty 1

## 2018-11-03 MED ORDER — SODIUM CHLORIDE 0.9 % IV BOLUS
1000.0000 mL | Freq: Once | INTRAVENOUS | Status: AC
  Administered 2018-11-03: 1000 mL via INTRAVENOUS

## 2018-11-03 MED ORDER — DEXAMETHASONE SODIUM PHOSPHATE 10 MG/ML IJ SOLN
10.0000 mg | Freq: Once | INTRAMUSCULAR | Status: DC
  Filled 2018-11-03: qty 1

## 2018-11-03 MED ORDER — DEXAMETHASONE SODIUM PHOSPHATE 10 MG/ML IJ SOLN: 10.0000 mg | Freq: Once | INTRAMUSCULAR | Status: DC

## 2018-11-03 MED ORDER — PREDNISONE 10 MG PO TABS: ORAL_TABLET | ORAL | 1 refills | Status: DC

## 2018-11-03 NOTE — ED Triage Notes (Signed)
Pt arrives to ED A&O, ambulatory, speaking in complete sentences. States migraine. Hx of migraine. Takes rizatriptan and toradol. Took an old prednisone this AM. Migraine returned at work today. Someone gave her half a zofran.

## 2018-11-03 NOTE — Progress Notes (Signed)
[facility] Psychosocial Distress Screening Clinical Social Work  Clinical Social Work was referred by distress screening protocol.  The patient scored a 7 on the Psychosocial Distress Thermometer which indicates moderate distress. Clinical Social Worker contacted patient by phone to assess for distress and other psychosocial needs. Called patient, left VM w information about [place] and how to access resources if needed.    ONCBCN DISTRESS SCREENING 11/01/2018  Screening Type Initial Screening  Distress experienced in past week (1-10) 7  Practical problem type Insurance  Emotional problem type Nervousness/Anxiety;Adjusting to illness;Isolation/feeling alone  Information Concerns Type Lack of info about treatment  Physical Problem type   Referral to support programs     Clinical Social Worker follow up needed: No.  If yes, follow up plan:  Beverely Pace, [place], LCSW Clinical Social Worker Phone:  [phone]

## 2018-11-03 NOTE — ED Notes (Signed)
NO blood work per Dr. Quentin Cornwall.
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
## 2018-11-04 DIAGNOSIS — J301 Allergic rhinitis due to pollen: Secondary | ICD-10-CM | POA: Diagnosis not present

## 2018-11-07 ENCOUNTER — Ambulatory Visit
Admission: RE | Admit: 2018-11-07 | Discharge: 2018-11-07 | Disposition: A | Discharge status: Home / Self Care | Payer: BLUE CROSS/BLUE SHIELD | Source: Ambulatory Visit | Attending: Radiation Oncology | Admitting: Radiation Oncology

## 2018-11-07 ENCOUNTER — Encounter: Payer: Self-pay | Admitting: Physical Therapy

## 2018-11-07 ENCOUNTER — Ambulatory Visit: Payer: BLUE CROSS/BLUE SHIELD | Attending: General Surgery | Admitting: Physical Therapy

## 2018-11-07 ENCOUNTER — Other Ambulatory Visit: Payer: Self-pay

## 2018-11-07 DIAGNOSIS — M25611 Stiffness of right shoulder, not elsewhere classified: Secondary | ICD-10-CM | POA: Insufficient documentation

## 2018-11-07 DIAGNOSIS — C50311 Malignant neoplasm of lower-inner quadrant of right female breast: Secondary | ICD-10-CM | POA: Diagnosis not present

## 2018-11-07 DIAGNOSIS — Z170 Estrogen receptor positive status [ER+]: Secondary | ICD-10-CM | POA: Diagnosis not present

## 2018-11-07 DIAGNOSIS — R293 Abnormal posture: Secondary | ICD-10-CM | POA: Insufficient documentation

## 2018-11-07 DIAGNOSIS — C50211 Malignant neoplasm of upper-inner quadrant of right female breast: Secondary | ICD-10-CM | POA: Diagnosis not present

## 2018-11-07 DIAGNOSIS — Z483 Aftercare following surgery for neoplasm: Secondary | ICD-10-CM | POA: Diagnosis not present

## 2018-11-07 NOTE — Patient Instructions (Signed)
Cane Exercise: Abduction    Hold cane with right hand over end, palm-up, with other hand palm-down. Move arm out from side and up by pushing with other arm. Hold __3__ seconds. Repeat __10__ times. Do __2__ sessions per day.  http://gt2.exer.us/82   Copyright  VHI. All rights reserved.  Closed Chain: Shoulder Abduction / Adduction - on Wall    One hand on wall, step to side and return. Stepping causes shoulder to abduct and adduct. Step _5__ times, hold for 5 seconds, repeat 5 times, _2_ times per day.  http://ss.exer.us/267   Copyright  VHI. All rights reserved.
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
## 2018-11-08 ENCOUNTER — Encounter (HOSPITAL_COMMUNITY): Payer: Self-pay | Admitting: Hematology and Oncology

## 2018-11-08 ENCOUNTER — Ambulatory Visit: Admission: RE | Admit: 2018-11-08 | Payer: BLUE CROSS/BLUE SHIELD | Source: Ambulatory Visit

## 2018-11-09 ENCOUNTER — Ambulatory Visit: Payer: BLUE CROSS/BLUE SHIELD

## 2018-11-09 DIAGNOSIS — Z8371 Family history of colonic polyps: Secondary | ICD-10-CM | POA: Diagnosis not present

## 2018-11-09 DIAGNOSIS — K2970 Gastritis, unspecified, without bleeding: Secondary | ICD-10-CM | POA: Diagnosis not present

## 2018-11-09 DIAGNOSIS — K640 First degree hemorrhoids: Secondary | ICD-10-CM | POA: Diagnosis not present

## 2018-11-09 DIAGNOSIS — K2950 Unspecified chronic gastritis without bleeding: Secondary | ICD-10-CM | POA: Diagnosis not present

## 2018-11-09 DIAGNOSIS — K219 Gastro-esophageal reflux disease without esophagitis: Secondary | ICD-10-CM | POA: Diagnosis not present

## 2018-11-09 DIAGNOSIS — K449 Diaphragmatic hernia without obstruction or gangrene: Secondary | ICD-10-CM | POA: Diagnosis not present

## 2018-11-09 DIAGNOSIS — Z1211 Encounter for screening for malignant neoplasm of colon: Secondary | ICD-10-CM | POA: Diagnosis not present

## 2018-11-09 DIAGNOSIS — K317 Polyp of stomach and duodenum: Secondary | ICD-10-CM | POA: Diagnosis not present

## 2018-11-09 DIAGNOSIS — K3189 Other diseases of stomach and duodenum: Secondary | ICD-10-CM | POA: Diagnosis not present

## 2018-11-09 DIAGNOSIS — R1013 Epigastric pain: Secondary | ICD-10-CM | POA: Diagnosis not present

## 2018-11-10 ENCOUNTER — Ambulatory Visit
Admission: RE | Admit: 2018-11-10 | Discharge: 2018-11-10 | Disposition: A | Discharge status: Home / Self Care | Payer: BLUE CROSS/BLUE SHIELD | Source: Ambulatory Visit | Attending: Radiation Oncology | Admitting: Radiation Oncology

## 2018-11-10 DIAGNOSIS — C50211 Malignant neoplasm of upper-inner quadrant of right female breast: Secondary | ICD-10-CM | POA: Diagnosis not present

## 2018-11-10 DIAGNOSIS — M25611 Stiffness of right shoulder, not elsewhere classified: Secondary | ICD-10-CM | POA: Diagnosis not present

## 2018-11-10 DIAGNOSIS — Z170 Estrogen receptor positive status [ER+]: Secondary | ICD-10-CM | POA: Diagnosis not present

## 2018-11-10 DIAGNOSIS — C50311 Malignant neoplasm of lower-inner quadrant of right female breast: Secondary | ICD-10-CM | POA: Diagnosis not present

## 2018-11-10 DIAGNOSIS — R293 Abnormal posture: Secondary | ICD-10-CM | POA: Diagnosis not present

## 2018-11-10 DIAGNOSIS — Z483 Aftercare following surgery for neoplasm: Secondary | ICD-10-CM | POA: Diagnosis not present

## 2018-11-11 ENCOUNTER — Ambulatory Visit
Admission: RE | Admit: 2018-11-11 | Discharge: 2018-11-11 | Disposition: A | Discharge status: Home / Self Care | Payer: BLUE CROSS/BLUE SHIELD | Source: Ambulatory Visit | Attending: Radiation Oncology | Admitting: Radiation Oncology

## 2018-11-11 DIAGNOSIS — Z483 Aftercare following surgery for neoplasm: Secondary | ICD-10-CM | POA: Diagnosis not present

## 2018-11-11 DIAGNOSIS — G518 Other disorders of facial nerve: Secondary | ICD-10-CM | POA: Diagnosis not present

## 2018-11-11 DIAGNOSIS — M25611 Stiffness of right shoulder, not elsewhere classified: Secondary | ICD-10-CM | POA: Diagnosis not present

## 2018-11-11 DIAGNOSIS — M542 Cervicalgia: Secondary | ICD-10-CM | POA: Diagnosis not present

## 2018-11-11 DIAGNOSIS — G43719 Chronic migraine without aura, intractable, without status migrainosus: Secondary | ICD-10-CM | POA: Diagnosis not present

## 2018-11-11 DIAGNOSIS — C50211 Malignant neoplasm of upper-inner quadrant of right female breast: Secondary | ICD-10-CM

## 2018-11-11 DIAGNOSIS — Z170 Estrogen receptor positive status [ER+]: Secondary | ICD-10-CM

## 2018-11-11 DIAGNOSIS — R293 Abnormal posture: Secondary | ICD-10-CM | POA: Diagnosis not present

## 2018-11-11 DIAGNOSIS — M791 Myalgia, unspecified site: Secondary | ICD-10-CM | POA: Diagnosis not present

## 2018-11-11 DIAGNOSIS — C50311 Malignant neoplasm of lower-inner quadrant of right female breast: Secondary | ICD-10-CM | POA: Diagnosis not present

## 2018-11-11 DIAGNOSIS — G43019 Migraine without aura, intractable, without status migrainosus: Secondary | ICD-10-CM | POA: Diagnosis not present

## 2018-11-11 MED ORDER — RADIAPLEXRX EX GEL
Freq: Once | CUTANEOUS | Status: AC
  Administered 2018-11-11: 15:00:00 via TOPICAL

## 2018-11-11 MED ORDER — ALRA NON-METALLIC DEODORANT (RAD-ONC)
1.0000 "application " | Freq: Once | TOPICAL | Status: AC
  Administered 2018-11-11: 1 via TOPICAL

## 2018-11-14 ENCOUNTER — Ambulatory Visit
Admission: RE | Admit: 2018-11-14 | Discharge: 2018-11-14 | Disposition: A | Discharge status: Home / Self Care | Payer: BLUE CROSS/BLUE SHIELD | Source: Ambulatory Visit | Attending: Radiation Oncology | Admitting: Radiation Oncology

## 2018-11-14 DIAGNOSIS — M2041 Other hammer toe(s) (acquired), right foot: Secondary | ICD-10-CM | POA: Diagnosis not present

## 2018-11-14 DIAGNOSIS — M2012 Hallux valgus (acquired), left foot: Secondary | ICD-10-CM | POA: Diagnosis not present

## 2018-11-14 DIAGNOSIS — M2011 Hallux valgus (acquired), right foot: Secondary | ICD-10-CM | POA: Diagnosis not present

## 2018-11-14 DIAGNOSIS — C50211 Malignant neoplasm of upper-inner quadrant of right female breast: Secondary | ICD-10-CM | POA: Diagnosis not present

## 2018-11-14 DIAGNOSIS — Z170 Estrogen receptor positive status [ER+]: Secondary | ICD-10-CM | POA: Diagnosis not present

## 2018-11-14 DIAGNOSIS — Z483 Aftercare following surgery for neoplasm: Secondary | ICD-10-CM | POA: Diagnosis not present

## 2018-11-14 DIAGNOSIS — R293 Abnormal posture: Secondary | ICD-10-CM | POA: Diagnosis not present

## 2018-11-14 DIAGNOSIS — C50311 Malignant neoplasm of lower-inner quadrant of right female breast: Secondary | ICD-10-CM | POA: Diagnosis not present

## 2018-11-14 DIAGNOSIS — M25611 Stiffness of right shoulder, not elsewhere classified: Secondary | ICD-10-CM | POA: Diagnosis not present

## 2018-11-14 DIAGNOSIS — M2042 Other hammer toe(s) (acquired), left foot: Secondary | ICD-10-CM | POA: Diagnosis not present

## 2018-11-15 ENCOUNTER — Ambulatory Visit
Admission: RE | Admit: 2018-11-15 | Discharge: 2018-11-15 | Disposition: A | Discharge status: Home / Self Care | Payer: BLUE CROSS/BLUE SHIELD | Source: Ambulatory Visit | Attending: Radiation Oncology | Admitting: Radiation Oncology

## 2018-11-15 DIAGNOSIS — C50211 Malignant neoplasm of upper-inner quadrant of right female breast: Secondary | ICD-10-CM | POA: Diagnosis not present

## 2018-11-15 DIAGNOSIS — Z483 Aftercare following surgery for neoplasm: Secondary | ICD-10-CM | POA: Diagnosis not present

## 2018-11-15 DIAGNOSIS — R293 Abnormal posture: Secondary | ICD-10-CM | POA: Diagnosis not present

## 2018-11-15 DIAGNOSIS — Z170 Estrogen receptor positive status [ER+]: Secondary | ICD-10-CM | POA: Diagnosis not present

## 2018-11-15 DIAGNOSIS — M25611 Stiffness of right shoulder, not elsewhere classified: Secondary | ICD-10-CM | POA: Diagnosis not present

## 2018-11-15 DIAGNOSIS — C50311 Malignant neoplasm of lower-inner quadrant of right female breast: Secondary | ICD-10-CM | POA: Diagnosis not present

## 2018-11-17 ENCOUNTER — Ambulatory Visit
Admission: RE | Admit: 2018-11-17 | Discharge: 2018-11-17 | Disposition: A | Discharge status: Home / Self Care | Payer: BLUE CROSS/BLUE SHIELD | Source: Ambulatory Visit | Attending: Radiation Oncology | Admitting: Radiation Oncology

## 2018-11-17 DIAGNOSIS — R293 Abnormal posture: Secondary | ICD-10-CM | POA: Diagnosis not present

## 2018-11-17 DIAGNOSIS — C50311 Malignant neoplasm of lower-inner quadrant of right female breast: Secondary | ICD-10-CM | POA: Diagnosis not present

## 2018-11-17 DIAGNOSIS — Z170 Estrogen receptor positive status [ER+]: Secondary | ICD-10-CM | POA: Diagnosis not present

## 2018-11-17 DIAGNOSIS — M25611 Stiffness of right shoulder, not elsewhere classified: Secondary | ICD-10-CM | POA: Diagnosis not present

## 2018-11-17 DIAGNOSIS — Z483 Aftercare following surgery for neoplasm: Secondary | ICD-10-CM | POA: Diagnosis not present

## 2018-11-17 DIAGNOSIS — C50211 Malignant neoplasm of upper-inner quadrant of right female breast: Secondary | ICD-10-CM | POA: Diagnosis not present

## 2018-11-18 ENCOUNTER — Ambulatory Visit
Admission: RE | Admit: 2018-11-18 | Discharge: 2018-11-18 | Disposition: A | Discharge status: Home / Self Care | Payer: BLUE CROSS/BLUE SHIELD | Source: Ambulatory Visit | Attending: Radiation Oncology | Admitting: Radiation Oncology

## 2018-11-18 DIAGNOSIS — M25611 Stiffness of right shoulder, not elsewhere classified: Secondary | ICD-10-CM | POA: Diagnosis not present

## 2018-11-18 DIAGNOSIS — R293 Abnormal posture: Secondary | ICD-10-CM | POA: Diagnosis not present

## 2018-11-18 DIAGNOSIS — Z483 Aftercare following surgery for neoplasm: Secondary | ICD-10-CM | POA: Diagnosis not present

## 2018-11-18 DIAGNOSIS — C50211 Malignant neoplasm of upper-inner quadrant of right female breast: Secondary | ICD-10-CM

## 2018-11-18 DIAGNOSIS — C50311 Malignant neoplasm of lower-inner quadrant of right female breast: Secondary | ICD-10-CM | POA: Diagnosis not present

## 2018-11-18 DIAGNOSIS — Z170 Estrogen receptor positive status [ER+]: Secondary | ICD-10-CM | POA: Diagnosis not present

## 2018-11-18 MED ORDER — RADIAPLEXRX EX GEL
Freq: Once | CUTANEOUS | Status: AC
  Administered 2018-11-18: 16:00:00 via TOPICAL

## 2018-11-21 ENCOUNTER — Ambulatory Visit (INDEPENDENT_AMBULATORY_CARE_PROVIDER_SITE_OTHER): Payer: BLUE CROSS/BLUE SHIELD | Admitting: Gynecology

## 2018-11-21 ENCOUNTER — Other Ambulatory Visit: Payer: Self-pay | Admitting: Family Medicine

## 2018-11-21 ENCOUNTER — Encounter: Payer: Self-pay | Admitting: Gynecology

## 2018-11-21 ENCOUNTER — Ambulatory Visit
Admission: RE | Admit: 2018-11-21 | Discharge: 2018-11-21 | Disposition: A | Discharge status: Home / Self Care | Payer: BLUE CROSS/BLUE SHIELD | Source: Ambulatory Visit | Attending: Radiation Oncology | Admitting: Radiation Oncology

## 2018-11-21 VITALS — BP 118/76 | Ht 61.0 in | Wt 124.0 lb

## 2018-11-21 DIAGNOSIS — R21 Rash and other nonspecific skin eruption: Secondary | ICD-10-CM | POA: Diagnosis not present

## 2018-11-21 DIAGNOSIS — Z853 Personal history of malignant neoplasm of breast: Secondary | ICD-10-CM

## 2018-11-21 DIAGNOSIS — C50211 Malignant neoplasm of upper-inner quadrant of right female breast: Secondary | ICD-10-CM | POA: Diagnosis not present

## 2018-11-21 DIAGNOSIS — M8580 Other specified disorders of bone density and structure, unspecified site: Secondary | ICD-10-CM

## 2018-11-21 DIAGNOSIS — N952 Postmenopausal atrophic vaginitis: Secondary | ICD-10-CM | POA: Diagnosis not present

## 2018-11-21 DIAGNOSIS — C50311 Malignant neoplasm of lower-inner quadrant of right female breast: Secondary | ICD-10-CM | POA: Diagnosis not present

## 2018-11-21 DIAGNOSIS — Z170 Estrogen receptor positive status [ER+]: Secondary | ICD-10-CM | POA: Diagnosis not present

## 2018-11-21 DIAGNOSIS — R293 Abnormal posture: Secondary | ICD-10-CM | POA: Diagnosis not present

## 2018-11-21 DIAGNOSIS — Z01419 Encounter for gynecological examination (general) (routine) without abnormal findings: Secondary | ICD-10-CM

## 2018-11-21 DIAGNOSIS — M25611 Stiffness of right shoulder, not elsewhere classified: Secondary | ICD-10-CM | POA: Diagnosis not present

## 2018-11-21 DIAGNOSIS — Z483 Aftercare following surgery for neoplasm: Secondary | ICD-10-CM | POA: Diagnosis not present

## 2018-11-21 MED ORDER — NYSTATIN-TRIAMCINOLONE 100000-0.1 UNIT/GM-% EX OINT: 1.0000 "application " | TOPICAL_OINTMENT | Freq: Two times a day (BID) | CUTANEOUS | 0 refills | Status: DC

## 2018-11-21 NOTE — Addendum Note (Signed)
Addended by: Nelva Nay on: 11/21/2018 11:01 AM   Modules accepted: Orders

## 2018-11-21 NOTE — Patient Instructions (Signed)
Follow-up for the bone density as scheduled. 

## 2018-11-21 NOTE — Progress Notes (Signed)
    Jennifer [name] Oct 26, 1956 [id number]        62 y.o.  G0P0 for annual gynecologic exam.  Recently diagnosed with left-sided breast cancer.  Status post lumpectomy and now undergoing radiation.  Had been on HRT but discontinued.  Past medical history,surgical history, problem list, medications, allergies, family history and social history were all reviewed and documented as reviewed in the EPIC chart.  ROS:  Performed with pertinent positives and negatives included in the history, assessment and plan.   Additional significant findings : None   Exam: Caryn Bee assistant Vitals:   11/21/18 1013  BP: 118/76  Weight: 124 lb (56.2 kg)  Height: 5\' 1"  (1.549 m)   Body mass index is 23.43 kg/m.  General appearance:  Normal affect, orientation and appearance. Skin: Grossly normal HEENT: Without gross lesions.  No cervical or supraclavicular adenopathy. Thyroid normal.  Lungs:  Clear without wheezing, rales or rhonchi Cardiac: RR, without RMG Abdominal:  Soft, nontender, without masses, guarding, rebound, organomegaly or hernia Breasts:  Examined lying and sitting.  Left without masses, retractions, discharge or axillary adenopathy.  Right status post lumpectomy with mild erythema consistent with radiation.  No masses or axillary adenopathy.  Petechiae type skin rash around her lumpectomy scar in the midline. Pelvic:  Ext, BUS, Vagina: With atrophic changes  Cervix: With atrophic changes.  Pap smear done  Uterus: Anteverted, normal size, shape and contour, midline and mobile nontender   Adnexa: Without masses or tenderness    Anus and perineum: Normal   Rectovaginal: Normal sphincter tone without palpated masses or tenderness.    Assessment/Plan:  62 y.o. G0P0 female for annual gynecologic exam.   1. Postmenopausal.  Stopped HRT.  Overall doing well without significant menopausal symptoms or any vaginal bleeding. 2. Recent diagnoses of right breast cancer.  Undergoing radiation now.   Skin rash was told was fungal.  Using nystatin powder.  Will treat with Mycolog to see if this does not help it clear more rapidly.  We will follow-up with her oncologist for ongoing treatment. 3. Colonoscopy 2019.  Repeat at their recommended interval. 4. Osteopenia.  DEXA 2018 T score -2.2 FRAX 9.9% / 1.6%.  Schedule and follow-up for repeat DEXA now at 2-year interval and she will arrange. 5. Pap smear/HPV 2014.  Pap smear done today.  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 6. Health maintenance.  No routine lab work done as patient does this elsewhere.  Follow-up 1 year, sooner as needed.   Anastasio Auerbach MD, 10:43 AM 11/21/2018

## 2018-11-22 ENCOUNTER — Other Ambulatory Visit: Payer: Self-pay | Admitting: *Deleted

## 2018-11-22 ENCOUNTER — Ambulatory Visit
Admission: RE | Admit: 2018-11-22 | Discharge: 2018-11-22 | Disposition: A | Discharge status: Home / Self Care | Payer: BLUE CROSS/BLUE SHIELD | Source: Ambulatory Visit | Attending: Radiation Oncology | Admitting: Radiation Oncology

## 2018-11-22 DIAGNOSIS — Z483 Aftercare following surgery for neoplasm: Secondary | ICD-10-CM | POA: Diagnosis not present

## 2018-11-22 DIAGNOSIS — C50311 Malignant neoplasm of lower-inner quadrant of right female breast: Secondary | ICD-10-CM | POA: Diagnosis not present

## 2018-11-22 DIAGNOSIS — R293 Abnormal posture: Secondary | ICD-10-CM | POA: Diagnosis not present

## 2018-11-22 DIAGNOSIS — C50211 Malignant neoplasm of upper-inner quadrant of right female breast: Secondary | ICD-10-CM | POA: Diagnosis not present

## 2018-11-22 DIAGNOSIS — M25611 Stiffness of right shoulder, not elsewhere classified: Secondary | ICD-10-CM | POA: Diagnosis not present

## 2018-11-22 DIAGNOSIS — Z170 Estrogen receptor positive status [ER+]: Secondary | ICD-10-CM | POA: Diagnosis not present

## 2018-11-22 LAB — PAP IG W/ RFLX HPV ASCU

## 2018-11-22 MED ORDER — ALPRAZOLAM 0.5 MG PO TABS: ORAL_TABLET | ORAL | 0 refills | Status: DC

## 2018-11-22 NOTE — Telephone Encounter (Signed)
Last Ov 09/27/2018. Last Rx 06/2018 #15

## 2018-11-24 ENCOUNTER — Ambulatory Visit
Admission: RE | Admit: 2018-11-24 | Discharge: 2018-11-24 | Disposition: A | Discharge status: Home / Self Care | Payer: BLUE CROSS/BLUE SHIELD | Source: Ambulatory Visit | Attending: Radiation Oncology | Admitting: Radiation Oncology

## 2018-11-24 DIAGNOSIS — C50211 Malignant neoplasm of upper-inner quadrant of right female breast: Secondary | ICD-10-CM | POA: Insufficient documentation

## 2018-11-24 DIAGNOSIS — Z510 Encounter for antineoplastic radiation therapy: Secondary | ICD-10-CM | POA: Insufficient documentation

## 2018-11-25 ENCOUNTER — Ambulatory Visit
Admission: RE | Admit: 2018-11-25 | Discharge: 2018-11-25 | Disposition: A | Discharge status: Home / Self Care | Payer: BLUE CROSS/BLUE SHIELD | Source: Ambulatory Visit | Attending: Radiation Oncology | Admitting: Radiation Oncology

## 2018-11-25 DIAGNOSIS — C50211 Malignant neoplasm of upper-inner quadrant of right female breast: Secondary | ICD-10-CM | POA: Diagnosis not present

## 2018-11-25 DIAGNOSIS — Z510 Encounter for antineoplastic radiation therapy: Secondary | ICD-10-CM | POA: Diagnosis not present

## 2018-11-27 ENCOUNTER — Telehealth: Payer: Self-pay | Admitting: Family Medicine

## 2018-11-28 ENCOUNTER — Ambulatory Visit
Admission: RE | Admit: 2018-11-28 | Discharge: 2018-11-28 | Disposition: A | Discharge status: Home / Self Care | Payer: BLUE CROSS/BLUE SHIELD | Source: Ambulatory Visit | Attending: Radiation Oncology | Admitting: Radiation Oncology

## 2018-11-28 DIAGNOSIS — G43719 Chronic migraine without aura, intractable, without status migrainosus: Secondary | ICD-10-CM | POA: Diagnosis not present

## 2018-11-28 DIAGNOSIS — Z510 Encounter for antineoplastic radiation therapy: Secondary | ICD-10-CM | POA: Diagnosis not present

## 2018-11-28 DIAGNOSIS — C50211 Malignant neoplasm of upper-inner quadrant of right female breast: Secondary | ICD-10-CM | POA: Diagnosis not present

## 2018-11-28 NOTE — Telephone Encounter (Signed)
She has had a lot going on  Please call her to schedule lab for TSH when able   Refill until then  Anytime winter or early spring is ok

## 2018-11-28 NOTE — Telephone Encounter (Signed)
Pt hasn't had a TSH lab in over a year and no future appts., please advise

## 2018-11-29 ENCOUNTER — Telehealth: Payer: Self-pay | Admitting: Family Medicine

## 2018-11-29 ENCOUNTER — Other Ambulatory Visit: Payer: BLUE CROSS/BLUE SHIELD

## 2018-11-29 ENCOUNTER — Ambulatory Visit
Admission: RE | Admit: 2018-11-29 | Discharge: 2018-11-29 | Disposition: A | Discharge status: Home / Self Care | Payer: BLUE CROSS/BLUE SHIELD | Source: Ambulatory Visit | Attending: Radiation Oncology | Admitting: Radiation Oncology

## 2018-11-29 DIAGNOSIS — C50211 Malignant neoplasm of upper-inner quadrant of right female breast: Secondary | ICD-10-CM | POA: Diagnosis not present

## 2018-11-29 DIAGNOSIS — Z510 Encounter for antineoplastic radiation therapy: Secondary | ICD-10-CM | POA: Diagnosis not present

## 2018-11-29 DIAGNOSIS — E039 Hypothyroidism, unspecified: Secondary | ICD-10-CM

## 2018-11-29 NOTE — Telephone Encounter (Signed)
I left a detailed message on patient's voice mail to call and schedule lab appointment, so her medication can be refilled.

## 2018-11-29 NOTE — Telephone Encounter (Signed)
Med filled once and Morey [facility] will try to reach out to pt to get lab appt scheduled

## 2018-11-29 NOTE — Telephone Encounter (Signed)
-----   Message from Tammi Sou, [place] sent at 11/29/2018 11:46 AM EST ----- Regarding: FW: lab appt See prev message. Pt scheduled lab appt today please put lab orders in, thanks ----- Message ----- From: Camillia Herter Sent: 11/29/2018  11:45 AM EST To: Tammi Sou, CMA Subject: RE: lab appt                                   Patient scheduled appointment for today at 3:00. ----- Message ----- From: Tammi Sou, CMA Sent: 11/29/2018   8:58 AM EST To: Camillia Herter Subject: lab appt                                       I received a refill request for her thyroid med and per Dr. Glori Bickers pt needs just a lab appt to recheck thyroid labs because it's been over a year since she had them checked. Please schedule appt and I will refill med once, thanks

## 2018-11-30 ENCOUNTER — Other Ambulatory Visit: Payer: Self-pay | Admitting: Family Medicine

## 2018-11-30 ENCOUNTER — Ambulatory Visit
Admission: RE | Admit: 2018-11-30 | Discharge: 2018-11-30 | Disposition: A | Discharge status: Home / Self Care | Payer: BLUE CROSS/BLUE SHIELD | Source: Ambulatory Visit | Attending: Radiation Oncology | Admitting: Radiation Oncology

## 2018-11-30 DIAGNOSIS — C50211 Malignant neoplasm of upper-inner quadrant of right female breast: Secondary | ICD-10-CM | POA: Diagnosis not present

## 2018-11-30 DIAGNOSIS — Z510 Encounter for antineoplastic radiation therapy: Secondary | ICD-10-CM | POA: Diagnosis not present

## 2018-11-30 DIAGNOSIS — E039 Hypothyroidism, unspecified: Secondary | ICD-10-CM

## 2018-12-01 ENCOUNTER — Ambulatory Visit: Payer: BLUE CROSS/BLUE SHIELD

## 2018-12-01 DIAGNOSIS — C50211 Malignant neoplasm of upper-inner quadrant of right female breast: Secondary | ICD-10-CM | POA: Diagnosis not present

## 2018-12-01 DIAGNOSIS — Z510 Encounter for antineoplastic radiation therapy: Secondary | ICD-10-CM | POA: Diagnosis not present

## 2018-12-02 ENCOUNTER — Ambulatory Visit: Admission: RE | Admit: 2018-12-02 | Payer: BLUE CROSS/BLUE SHIELD | Source: Ambulatory Visit

## 2018-12-02 ENCOUNTER — Ambulatory Visit: Payer: BLUE CROSS/BLUE SHIELD

## 2018-12-02 ENCOUNTER — Ambulatory Visit
Admission: RE | Admit: 2018-12-02 | Discharge: 2018-12-02 | Disposition: A | Discharge status: Home / Self Care | Payer: BLUE CROSS/BLUE SHIELD | Source: Ambulatory Visit | Attending: Radiation Oncology | Admitting: Radiation Oncology

## 2018-12-02 DIAGNOSIS — Z510 Encounter for antineoplastic radiation therapy: Secondary | ICD-10-CM | POA: Diagnosis not present

## 2018-12-02 DIAGNOSIS — C50211 Malignant neoplasm of upper-inner quadrant of right female breast: Secondary | ICD-10-CM | POA: Diagnosis not present

## 2018-12-05 ENCOUNTER — Inpatient Hospital Stay (HOSPITAL_BASED_OUTPATIENT_CLINIC_OR_DEPARTMENT_OTHER): Payer: BLUE CROSS/BLUE SHIELD | Admitting: Hematology and Oncology

## 2018-12-05 ENCOUNTER — Ambulatory Visit: Payer: BLUE CROSS/BLUE SHIELD

## 2018-12-05 ENCOUNTER — Telehealth: Payer: Self-pay | Admitting: Adult Health

## 2018-12-05 DIAGNOSIS — Z170 Estrogen receptor positive status [ER+]: Secondary | ICD-10-CM

## 2018-12-05 DIAGNOSIS — Z510 Encounter for antineoplastic radiation therapy: Secondary | ICD-10-CM | POA: Diagnosis not present

## 2018-12-05 DIAGNOSIS — Z923 Personal history of irradiation: Secondary | ICD-10-CM | POA: Insufficient documentation

## 2018-12-05 DIAGNOSIS — Z79899 Other long term (current) drug therapy: Secondary | ICD-10-CM

## 2018-12-05 DIAGNOSIS — C50211 Malignant neoplasm of upper-inner quadrant of right female breast: Secondary | ICD-10-CM

## 2018-12-05 MED ORDER — ANASTROZOLE 1 MG PO TABS: 1.0000 mg | ORAL_TABLET | Freq: Every day | ORAL | 3 refills | Status: DC

## 2018-12-05 NOTE — Assessment & Plan Note (Signed)
10/10/2018:Right lumpectomy: IDC grade 1, 1.2 cm, with intermediate grade DCIS, margins negative, 0/2 lymph nodes negative, T1CN0 stage Ia ER 100%, PR 90%, Ki-67 10%, HER-2 1+ negative Oncotype DX: 29: 18% risk of recurrence  Treatment plan: 1.  I recommended systemic adjuvant chemotherapy with Taxotere and Cytoxan every 3 weeks x4 cycles (patient refused) 2. Adjuvant radiation therapy 11/08/2018-12/05/2018 3. Adjuvant antiestrogen therapy  Letrozole counseling: We discussed the risks and benefits of anti-estrogen therapy with aromatase inhibitors. These include but not limited to insomnia, hot flashes, mood changes, vaginal dryness, bone density loss, and weight gain. We strongly believe that the benefits far outweigh the risks. Patient understands these risks and consented to starting treatment. Planned treatment duration is 7 years.  Return to clinic in 3 months for survivorship care plan visit.

## 2018-12-05 NOTE — Telephone Encounter (Signed)
Gave avs and calendar ° °

## 2018-12-05 NOTE — Progress Notes (Addendum)
Patient Care Team: Tower, [name], MD as PCP - General Nicholas Lose, MD as Consulting Physician (Hematology and Oncology) Kyung Rudd, MD as Consulting Physician (Radiation Oncology) Jovita Kussmaul, MD as Consulting Physician (General Surgery)  DIAGNOSIS:  Encounter Diagnosis  Name Primary?  . Malignant neoplasm of upper-inner quadrant of right breast in female, estrogen receptor positive ([place])     SUMMARY OF ONCOLOGIC HISTORY:   Malignant neoplasm of upper-inner quadrant of right breast in female, estrogen receptor positive ([place])   09/13/2018 Initial Diagnosis    Diagnostic mammogram detected right breast mass with distortion LIQ at 2:00 9 cm from nipple 1.7 cm, at 1:00 there was a benign 1.2 cm fibrocystic change, biopsy of the 2:00 mass revealed grade 1 IDC with DCIS ER 100%, PR 90%, Ki-67 10%, HER-2 1+ negative, T1CN0 stage Ia clinical stage    10/10/2018 Surgery    Right lumpectomy: IDC grade 1, 1.2 cm, with intermediate grade DCIS, margins negative, 0/2 lymph nodes negative, T1CN0 stage Ia ER 100%, PR 90%, Ki-67 10%, HER-2 1+ negative     10/10/2018 Oncotype testing    oncotype results of 29: Risk of recurrence without chemo 18%     CHIEF COMPLIANT: Follow-up towards end of radiation  INTERVAL HISTORY: Jennifer [name] is a 63 year old above-mentioned history of right breast cancer underwent lumpectomy and is currently undergoing radiation therapy.  She is experiencing radiation dermatitis but also cloudiness in the head from radiation therapy.  She will be finished this Thursday.  She is here to talk about antiestrogen treatment plan.  REVIEW OF SYSTEMS:   Constitutional: Denies fevers, chills or abnormal weight loss Eyes: Denies blurriness of vision Ears, nose, mouth, throat, and face: Denies mucositis or sore throat Respiratory: Denies cough, dyspnea or wheezes Cardiovascular: Denies palpitation, chest discomfort Gastrointestinal:  Denies nausea, heartburn or change in  bowel habits Skin: Denies abnormal skin rashes Lymphatics: Denies new lymphadenopathy or easy bruising Neurological:Denies numbness, tingling or new weaknesses Behavioral/Psych: Mood is stable, no new changes  Extremities: No lower extremity edema Breast: Radiation dermatitis All other systems were reviewed with the patient and are negative.  I have reviewed the past medical history, past surgical history, social history and family history with the patient and they are unchanged from previous note.  ALLERGIES:  is allergic to ciprofloxacin; codeine phosphate; decongestant [pseudoephedrine]; flonase [fluticasone propionate]; hydrocodone; reglan [metoclopramide]; topamax [topiramate]; benadryl [diphenhydramine hcl]; and sulfa antibiotics.  MEDICATIONS:  Current Outpatient Medications  Medication Sig Dispense Refill  . ALPRAZolam (XANAX) 0.5 MG tablet TAKE 1/2 TO 1 TABLET BY MOUTH ONCE DAILY AS NEEDED FOR SLEEP/ANXIETY 15 tablet 0  . atenolol (TENORMIN) 25 MG tablet Take 12.5 mg by mouth 3 (three) times daily. For migraine prevention    . baclofen (LIORESAL) 10 MG tablet Take 10 mg by mouth daily as needed (migraines).     . Calcium Carbonate-Vitamin D 600-400 MG-UNIT tablet Take 2 tablets by mouth daily.     . cyanocobalamin 2000 MCG tablet Take 2,000 mcg by mouth daily.    . famotidine (PEPCID) 40 MG tablet Take 40 mg by mouth 2 (two) times daily.     . hydrOXYzine (ATARAX/VISTARIL) 25 MG tablet Take 1 tablet (25 mg total) by mouth 3 (three) times daily as needed. (Patient taking differently: Take 25 mg by mouth 2 (two) times daily. ) 30 tablet 0  . ketorolac (TORADOL) 10 MG tablet Take 1 tablet (10 mg total) by mouth every 6 (six) hours as needed. (Patient taking  differently: Take 10 mg by mouth every 6 (six) hours as needed for moderate pain. ) 20 tablet 0  . levocetirizine (XYZAL) 5 MG tablet Take 1 tablet by mouth every evening.  3  . levothyroxine (SYNTHROID, LEVOTHROID) 75 MCG tablet  TAKE ONE TABLET BY MOUTH ONE TIME DAILY  30 tablet 0  . Misc Natural Products (GLUCOSAMINE CHOND COMPLEX/MSM) TABS Take 1 tablet by mouth daily.     . Multiple Vitamin (MULTIVITAMIN WITH MINERALS) TABS tablet Take 1 tablet by mouth daily.    . nabumetone (RELAFEN) 500 MG tablet Take 500 mg by mouth daily as needed (migraines).     . norethindrone-ethinyl estradiol (FEMHRT 1/5) 1-5 MG-MCG TABS tablet   4  . nystatin-triamcinolone ointment (MYCOLOG) Apply 1 application topically 2 (two) times daily. 30 g 0  . Omega-3 Fatty Acids (FISH OIL) 1000 MG CAPS Take 1,000 mg by mouth daily.     . OnabotulinumtoxinA (BOTOX IJ) Inject 1 Dose as directed every 6 (six) weeks.     . ondansetron (ZOFRAN ODT) 4 MG disintegrating tablet Take 1 tablet (4 mg total) by mouth every 8 (eight) hours as needed. 15 tablet 0  . pantoprazole (PROTONIX) 40 MG tablet TAKE ONE TABLET BY MOUTH ONE TIME DAILY 30 tablet 5  . predniSONE (DELTASONE) 10 MG tablet 50 mg Day 1 40 mg Day 2 30 mg Day 3 20 mg Day 4 10 mg Day 5 15 tablet 1  . Probiotic Product (PROBIOTIC-10 PO) Take 1 capsule by mouth daily.     . promethazine (PHENERGAN) 25 MG tablet Take 12.5 mg by mouth daily as needed for nausea or vomiting. When having a migraine    . rizatriptan (MAXALT) 10 MG tablet Take 1 tablet (10 mg total) by mouth once as needed for up to 10 days for migraine. May repeat in 2 hours if needed 10 tablet 0  . TURMERIC PO Take 1 tablet by mouth daily.      No current facility-administered medications for this visit.     PHYSICAL EXAMINATION: ECOG PERFORMANCE STATUS: 1 - Symptomatic but completely ambulatory  Vitals:   12/05/18 1120  BP: (!) 113/59  Pulse: 66  Resp: 18  Temp: 98.2 F (36.8 C)  SpO2: 97%   Filed Weights   12/05/18 1120  Weight: 125 lb 11.2 oz (57 kg)    GENERAL:alert, no distress and comfortable SKIN: skin color, texture, turgor are normal, no rashes or significant lesions EYES: normal, Conjunctiva are pink and  non-injected, sclera clear OROPHARYNX:no exudate, no erythema and lips, buccal mucosa, and tongue normal  NECK: supple, thyroid normal size, non-tender, without nodularity LYMPH:  no palpable lymphadenopathy in the cervical, axillary or inguinal LUNGS: clear to auscultation and percussion with normal breathing effort HEART: regular rate & rhythm and no murmurs and no lower extremity edema ABDOMEN:abdomen soft, non-tender and normal bowel sounds MUSCULOSKELETAL:no cyanosis of digits and no clubbing  NEURO: alert & oriented x 3 with fluent speech, no focal motor/sensory deficits EXTREMITIES: No lower extremity edema    LABORATORY DATA:  I have reviewed the data as listed CMP Latest Ref Rng & Units 09/24/2018 09/21/2018 11/08/2017  Glucose 70 - 99 mg/dL 151(H) 98 95  BUN 8 - 23 mg/dL 12 14 15   Creatinine 0.44 - 1.00 mg/dL 0.60 0.83 0.72  Sodium 135 - 145 mmol/L 136 135 136  Potassium 3.5 - 5.1 mmol/L 4.1 4.5 4.3  Chloride 98 - 111 mmol/L 99 99 102  CO2 22 - 32  mmol/L - 29 30  Calcium 8.9 - 10.3 mg/dL - 9.6 9.2  Total Protein 6.5 - 8.1 g/dL - 7.4 7.0  Total Bilirubin 0.3 - 1.2 mg/dL - 0.6 0.6  Alkaline Phos 38 - 126 U/L - 78 54  AST 15 - 41 U/L - 22 21  ALT 0 - 44 U/L - 32 15    Lab Results  Component Value Date   WBC 14.1 (H) 09/21/2018   HGB 12.9 09/24/2018   HCT 38.0 09/24/2018   MCV 86.8 09/21/2018   PLT 300 09/21/2018   NEUTROABS 12.1 (H) 09/21/2018    ASSESSMENT & PLAN:  Malignant neoplasm of upper-inner quadrant of right breast in female, estrogen receptor positive ([place]) 10/10/2018:Right lumpectomy: IDC grade 1, 1.2 cm, with intermediate grade DCIS, margins negative, 0/2 lymph nodes negative, T1CN0 stage Ia ER 100%, PR 90%, Ki-67 10%, HER-2 1+ negative Oncotype DX: 29: 18% risk of recurrence  Treatment plan: 1.  I recommended systemic adjuvant chemotherapy with Taxotere and Cytoxan every 3 weeks x4 cycles (patient refused) 2. Adjuvant radiation therapy  11/08/2018-12/05/2018 3. Adjuvant antiestrogen therapy  Letrozole counseling: We discussed the risks and benefits of anti-estrogen therapy with aromatase inhibitors. These include but not limited to insomnia, hot flashes, mood changes, vaginal dryness, bone density loss, and weight gain. We strongly believe that the benefits far outweigh the risks. Patient understands these risks and consented to starting treatment. Planned treatment duration is 7 years.  Return to clinic in 3 months for survivorship care plan visit.   No orders of the defined types were placed in this encounter.  The patient has a good understanding of the overall plan. she agrees with it. she will call with any problems that may develop before the next visit here.   Harriette Ohara, MD 12/05/18  Addendum: Her surveillance will be with mammograms done in June 2020 MRI to be done in December 2020 because of high breast density category D

## 2018-12-06 ENCOUNTER — Ambulatory Visit: Payer: BLUE CROSS/BLUE SHIELD

## 2018-12-06 DIAGNOSIS — Z510 Encounter for antineoplastic radiation therapy: Secondary | ICD-10-CM | POA: Diagnosis not present

## 2018-12-06 DIAGNOSIS — G43719 Chronic migraine without aura, intractable, without status migrainosus: Secondary | ICD-10-CM | POA: Diagnosis not present

## 2018-12-06 DIAGNOSIS — G518 Other disorders of facial nerve: Secondary | ICD-10-CM | POA: Diagnosis not present

## 2018-12-06 DIAGNOSIS — C50211 Malignant neoplasm of upper-inner quadrant of right female breast: Secondary | ICD-10-CM | POA: Diagnosis not present

## 2018-12-06 DIAGNOSIS — M542 Cervicalgia: Secondary | ICD-10-CM | POA: Diagnosis not present

## 2018-12-06 DIAGNOSIS — M791 Myalgia, unspecified site: Secondary | ICD-10-CM | POA: Diagnosis not present

## 2018-12-06 DIAGNOSIS — G43019 Migraine without aura, intractable, without status migrainosus: Secondary | ICD-10-CM | POA: Diagnosis not present

## 2018-12-07 ENCOUNTER — Other Ambulatory Visit: Payer: BLUE CROSS/BLUE SHIELD

## 2018-12-07 ENCOUNTER — Ambulatory Visit: Payer: BLUE CROSS/BLUE SHIELD

## 2018-12-07 ENCOUNTER — Ambulatory Visit
Admission: RE | Admit: 2018-12-07 | Discharge: 2018-12-07 | Disposition: A | Discharge status: Home / Self Care | Payer: BLUE CROSS/BLUE SHIELD | Source: Ambulatory Visit | Attending: Radiation Oncology | Admitting: Radiation Oncology

## 2018-12-07 ENCOUNTER — Telehealth: Payer: Self-pay | Admitting: Family Medicine

## 2018-12-07 DIAGNOSIS — Z0279 Encounter for issue of other medical certificate: Secondary | ICD-10-CM

## 2018-12-07 DIAGNOSIS — Z510 Encounter for antineoplastic radiation therapy: Secondary | ICD-10-CM | POA: Diagnosis not present

## 2018-12-07 DIAGNOSIS — C50211 Malignant neoplasm of upper-inner quadrant of right female breast: Secondary | ICD-10-CM | POA: Diagnosis not present

## 2018-12-07 NOTE — Telephone Encounter (Signed)
In your inbox for review once you return
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
## 2018-12-08 ENCOUNTER — Ambulatory Visit
Admission: RE | Admit: 2018-12-08 | Discharge: 2018-12-08 | Disposition: A | Discharge status: Home / Self Care | Payer: BLUE CROSS/BLUE SHIELD | Source: Ambulatory Visit | Attending: Radiation Oncology | Admitting: Radiation Oncology

## 2018-12-08 ENCOUNTER — Encounter: Payer: Self-pay | Admitting: Radiation Oncology

## 2018-12-08 DIAGNOSIS — Z510 Encounter for antineoplastic radiation therapy: Secondary | ICD-10-CM | POA: Diagnosis not present

## 2018-12-08 DIAGNOSIS — C50211 Malignant neoplasm of upper-inner quadrant of right female breast: Secondary | ICD-10-CM | POA: Diagnosis not present

## 2018-12-09 DIAGNOSIS — K210 Gastro-esophageal reflux disease with esophagitis: Secondary | ICD-10-CM | POA: Diagnosis not present

## 2018-12-13 NOTE — Telephone Encounter (Signed)
Pt notified form ready for pick up, and advise of the $20 fee

## 2018-12-13 NOTE — Telephone Encounter (Signed)
Pt want to know status of form that was dropped off on 1.15.20. pt stated it has to be returned by on the 1.27.20. please advise and call pt.

## 2018-12-25 ENCOUNTER — Telehealth: Payer: Self-pay | Admitting: Family Medicine

## 2018-12-25 DIAGNOSIS — Z0000 Encounter for general adult medical examination without abnormal findings: Secondary | ICD-10-CM

## 2018-12-25 DIAGNOSIS — E039 Hypothyroidism, unspecified: Secondary | ICD-10-CM

## 2018-12-25 DIAGNOSIS — E538 Deficiency of other specified B group vitamins: Secondary | ICD-10-CM

## 2018-12-25 DIAGNOSIS — R7303 Prediabetes: Secondary | ICD-10-CM

## 2018-12-25 NOTE — Telephone Encounter (Signed)
-----   Message from Lendon Collar, RT sent at 12/20/2018  1:13 PM EST ----- Regarding: Lab orders for Friday 12/30/18 Please enter CPE lab orders 12/30/18. Thanks!

## 2018-12-30 ENCOUNTER — Telehealth: Payer: Self-pay | Admitting: *Deleted

## 2018-12-30 ENCOUNTER — Other Ambulatory Visit (INDEPENDENT_AMBULATORY_CARE_PROVIDER_SITE_OTHER): Payer: BLUE CROSS/BLUE SHIELD

## 2018-12-30 DIAGNOSIS — Z0000 Encounter for general adult medical examination without abnormal findings: Secondary | ICD-10-CM | POA: Diagnosis not present

## 2018-12-30 DIAGNOSIS — E039 Hypothyroidism, unspecified: Secondary | ICD-10-CM | POA: Diagnosis not present

## 2018-12-30 DIAGNOSIS — E538 Deficiency of other specified B group vitamins: Secondary | ICD-10-CM | POA: Diagnosis not present

## 2018-12-30 DIAGNOSIS — R7303 Prediabetes: Secondary | ICD-10-CM

## 2018-12-30 LAB — COMPREHENSIVE METABOLIC PANEL
ALBUMIN: 4.4 g/dL (ref 3.5–5.2)
ALT: 87 U/L — ABNORMAL HIGH (ref 0–35)
AST: 54 U/L — ABNORMAL HIGH (ref 0–37)
Alkaline Phosphatase: 107 U/L (ref 39–117)
BILIRUBIN TOTAL: 0.6 mg/dL (ref 0.2–1.2)
BUN: 16 mg/dL (ref 6–23)
CO2: 30 mEq/L (ref 19–32)
Calcium: 9.7 mg/dL (ref 8.4–10.5)
Chloride: 101 mEq/L (ref 96–112)
Creatinine, Ser: 0.75 mg/dL (ref 0.40–1.20)
GFR: 78.24 mL/min (ref >60.00)
Glucose, Bld: 99 mg/dL (ref 70–99)
Potassium: 4.4 mEq/L (ref 3.5–5.1)
Sodium: 138 mEq/L (ref 135–145)
Total Protein: 7.1 g/dL (ref 6.0–8.3)

## 2018-12-30 LAB — CBC WITH DIFFERENTIAL/PLATELET
BASOS ABS: 0.1 10*3/uL (ref 0.0–0.1)
Basophils Relative: 1.1 % (ref 0.0–3.0)
Eosinophils Absolute: 0.2 10*3/uL (ref 0.0–0.7)
Eosinophils Relative: 3.9 % (ref 0.0–5.0)
HCT: 39.7 % (ref 36.0–46.0)
Hemoglobin: 13.4 g/dL (ref 12.0–15.0)
Lymphocytes Relative: 19.8 % (ref 12.0–46.0)
Lymphs Abs: 1.1 10*3/uL (ref 0.7–4.0)
MCHC: 33.6 g/dL (ref 30.0–36.0)
MCV: 84.7 fl (ref 78.0–100.0)
Monocytes Absolute: 0.8 10*3/uL (ref 0.1–1.0)
Monocytes Relative: 15.2 % — ABNORMAL HIGH (ref 3.0–12.0)
Neutro Abs: 3.4 10*3/uL (ref 1.4–7.7)
Neutrophils Relative %: 60 % (ref 43.0–77.0)
Platelets: 272 10*3/uL (ref 150.0–400.0)
RBC: 4.69 Mil/uL (ref 3.87–5.11)
RDW: 13.3 % (ref 11.5–15.5)
WBC: 5.6 10*3/uL (ref 4.0–10.5)

## 2018-12-30 LAB — LIPID PANEL
Cholesterol: 198 mg/dL (ref 0–200)
HDL: 60.9 mg/dL (ref >39.00)
LDL Cholesterol: 122 mg/dL — ABNORMAL HIGH (ref 0–99)
NonHDL: 137.25
TRIGLYCERIDES: 74 mg/dL (ref 0.0–149.0)
Total CHOL/HDL Ratio: 3
VLDL: 14.8 mg/dL (ref 0.0–40.0)

## 2018-12-30 LAB — TSH: TSH: 0.83 u[IU]/mL (ref 0.35–4.50)

## 2018-12-30 LAB — VITAMIN B12: Vitamin B-12: 1029 pg/mL — ABNORMAL HIGH (ref 211–911)

## 2018-12-30 LAB — HEMOGLOBIN A1C: Hgb A1c MFr Bld: 6 % (ref 4.6–6.5)

## 2018-12-30 NOTE — Telephone Encounter (Signed)
It would be better for this to be addressed by PCP, in case there is anything else other than RF that needs to be ordered.  I didn't put in orders at this point.  I will defer to PCP.  Routed to PCP.  Thanks.

## 2018-12-30 NOTE — Telephone Encounter (Signed)
Patient left a voicemail stating that she had lab work done today and would like a RF test added. Patient stated that she is having joint pain and swelling and would like her rheumatoid factor checked because of these symptoms. Patient stated that she has an upcoming physical scheduled with Dr. Glori Bickers.

## 2018-12-30 NOTE — Telephone Encounter (Signed)
Called pt and no answer and phone wasn't accepting VM at this time, will try to call back later

## 2018-12-30 NOTE — Telephone Encounter (Signed)
That is something that we should discuss before I order- depending on symptoms there may be more than one test to check

## 2019-01-03 ENCOUNTER — Encounter: Payer: Self-pay | Admitting: Family Medicine

## 2019-01-03 ENCOUNTER — Ambulatory Visit (INDEPENDENT_AMBULATORY_CARE_PROVIDER_SITE_OTHER): Payer: BLUE CROSS/BLUE SHIELD | Admitting: Family Medicine

## 2019-01-03 VITALS — BP 112/74 | HR 58 | Temp 97.8°F | Ht 60.25 in | Wt 124.2 lb

## 2019-01-03 DIAGNOSIS — Z0000 Encounter for general adult medical examination without abnormal findings: Secondary | ICD-10-CM | POA: Diagnosis not present

## 2019-01-03 DIAGNOSIS — R740 Nonspecific elevation of levels of transaminase and lactic acid dehydrogenase [LDH]: Secondary | ICD-10-CM

## 2019-01-03 DIAGNOSIS — R7303 Prediabetes: Secondary | ICD-10-CM

## 2019-01-03 DIAGNOSIS — Z1211 Encounter for screening for malignant neoplasm of colon: Secondary | ICD-10-CM

## 2019-01-03 DIAGNOSIS — E039 Hypothyroidism, unspecified: Secondary | ICD-10-CM

## 2019-01-03 DIAGNOSIS — E538 Deficiency of other specified B group vitamins: Secondary | ICD-10-CM

## 2019-01-03 DIAGNOSIS — R7401 Elevation of levels of liver transaminase levels: Secondary | ICD-10-CM

## 2019-01-03 DIAGNOSIS — M8580 Other specified disorders of bone density and structure, unspecified site: Secondary | ICD-10-CM

## 2019-01-03 DIAGNOSIS — C50211 Malignant neoplasm of upper-inner quadrant of right female breast: Secondary | ICD-10-CM

## 2019-01-03 DIAGNOSIS — Z170 Estrogen receptor positive status [ER+]: Secondary | ICD-10-CM

## 2019-01-03 NOTE — Assessment & Plan Note (Signed)
Mild with last labs 87 and 54 No etoh or acetaminophen intake  No h/o fatty liver Is in the midst of breast cancer tx -may be related No symptoms Will re check in 1 mo with hepatitis screen

## 2019-01-03 NOTE — Progress Notes (Signed)
Subjective:    Patient ID: Jennifer [name], female    DOB: 1956/02/17, 63 y.o.   MRN: [id number]  HPI Here for health maintenance exam and to review chronic medical problems    Wt Readings from Last 3 Encounters:  01/03/19 124 lb 4 oz (56.4 kg)  12/05/18 125 lb 11.2 oz (57 kg)  11/21/18 124 lb (56.2 kg)  less appetite  Wants to get back to exercise - getting ready to do it  24.06 kg/m   Colonoscopy -had colonoscopy/egd in December and all was fine  5 year recall  More constipation   Mammogram 4/19  Personal hx of breast cancer  On arimidex - a week into it (more hot flashes and tired and a little nauseated, and fuzzy)  Doing pretty well  Will have f/u breast MRIs due to breast density   Pap 12/19  Negative  Sees gyn    Tetanus shot 12/13 Had first shingrix vaccine 9/19 and 2nd in November   Hypothyroidism  Pt has no clinical changes No change in energy level/ hair or skin/ edema and no tremor Lab Results  Component Value Date   TSH 0.83 12/30/2018     Osteopenia Followed by gyn - has dexa scheduled for 2/19  Now on arimidex Taking her vitamin D and calcium  About to start exercise    B12 def Lab Results  Component Value Date   VITAMINB12 1,029 (H) 12/30/2018  she gets some in her vitamin   Prediabetes Lab Results  Component Value Date   HGBA1C 6.0 12/30/2018  was 6.1   Cholesterol Lab Results  Component Value Date   CHOL 198 12/30/2018   CHOL 153 11/08/2017   CHOL 166 06/02/2017   Lab Results  Component Value Date   HDL 60.90 12/30/2018   HDL 54.40 11/08/2017   HDL 52 06/02/2017   Lab Results  Component Value Date   LDLCALC 122 (H) 12/30/2018   LDLCALC 76 11/08/2017   LDLCALC 98 06/02/2017   Lab Results  Component Value Date   TRIG 74.0 12/30/2018   TRIG 114.0 11/08/2017   TRIG 79 06/02/2017   Lab Results  Component Value Date   CHOLHDL 3 12/30/2018   CHOLHDL 3 11/08/2017   CHOLHDL 3.2 06/02/2017   Lab Results  Component Value  Date   LDLDIRECT 98.6 01/10/2014   LDLDIRECT 121.8 09/16/2010  she was not eating as well /and spending time with family  Plans on getting back on track   Liver Lab Results  Component Value Date   ALT 87 (H) 12/30/2018   AST 54 (H) 12/30/2018   ALKPHOS 107 12/30/2018   BILITOT 0.6 12/30/2018   no etoh  No tylenol   Patient Active Problem List   Diagnosis Date Noted  . Elevated transaminase level 01/03/2019  . Family history of breast cancer   . Stress reaction 09/26/2018  . Malignant neoplasm of upper-inner quadrant of right breast in female, estrogen receptor positive ([place]) 09/20/2018  . B12 deficiency 04/13/2018  . Joint pain 04/11/2018  . Skin pain 04/11/2018  . Paresthesia 04/11/2018  . Colon cancer screening 09/16/2017  . Prediabetes 07/28/2016  . Allergic rhinitis 11/13/2014  . Screening for lipoid disorders 09/26/2014  . Preventative health care 09/26/2014  . Routine general medical examination at a health care facility 10/28/2012  . Herpes labialis 04/28/2011  . GERD 09/16/2010  . Hypothyroidism 05/07/2008  . Osteopenia 05/07/2008  . Migraine with aura 11/11/2007   Past Medical History:  Diagnosis  Date  . Allergic rhinitis   . Anxiety   . Arthritis    hands, knees  . Cancer ([place]) 09/2018   Breast CA new DX, right breast  . Cervical dysplasia   . Endometriosis   . Esophageal reflux   . Family history of breast cancer   . Hypothyroid   . Migraine with aura, without mention of intractable migraine without mention of status migrainosus   . Osteopenia 11/2016   T score -2.2 FRAX 9.9%/1.6%  . Pre-diabetes    Past Surgical History:  Procedure Laterality Date  . BREAST EXCISIONAL BIOPSY Bilateral over 10 years ago   benign x 2   . BREAST LUMPECTOMY WITH RADIOACTIVE SEED AND SENTINEL LYMPH NODE BIOPSY Right 10/10/2018   Procedure: RIGHT BREAST LUMPECTOMY WITH RADIOACTIVE SEED AND RIGHT SENTINEL LYMPH NODE BIOPSY;  Surgeon: Jovita Kussmaul, MD;  Location:  [facility];  Service: General;  Laterality: Right;  . BREAST SURGERY     Benign breast lump excised  . CERVICAL CONE BIOPSY  1985   severe dysplasia  . COLONOSCOPY  2005   normal  . COLPOSCOPY    . ROTATOR CUFF REPAIR  08/2008   Social History   Tobacco Use  . Smoking status: Never Smoker  . Smokeless tobacco: Never Used  Substance Use Topics  . Alcohol use: No    Alcohol/week: 0.0 standard drinks  . Drug use: No   Family History  Problem Relation Age of Onset  . Breast cancer Mother 90  . Diabetes Mother   . Hypertension Father   . Heart disease Father   . Osteoporosis Maternal Grandmother   . Diabetes Maternal Grandmother   . Hypertension Sister   . COPD Paternal Grandmother   . Heart disease Paternal Grandfather   . Breast cancer Cousin 55       pat first cousin  . Breast cancer Cousin        mat second cousin with breast cancer in her 58s   Allergies  Allergen Reactions  . Ciprofloxacin Other (See Comments)    Dizziness   . Codeine Phosphate Other (See Comments)    Dizziness, heart palpitations, nervous  . Decongestant [Pseudoephedrine] Other (See Comments)    dizziness  . Flonase [Fluticasone Propionate] Other (See Comments)    Worsens her migraine   . Hydrocodone Other (See Comments)    Made nervous--10/09 surgery rotator cuff  . Reglan [Metoclopramide] Other (See Comments)    Dizziness  . Topamax [Topiramate] Other (See Comments)    Dizziness   . Benadryl [Diphenhydramine Hcl] Palpitations  . Sulfa Antibiotics Anxiety   Current Outpatient Medications on File Prior to Visit  Medication Sig Dispense Refill  . ALPRAZolam (XANAX) 0.5 MG tablet TAKE 1/2 TO 1 TABLET BY MOUTH ONCE DAILY AS NEEDED FOR SLEEP/ANXIETY 15 tablet 0  . anastrozole (ARIMIDEX) 1 MG tablet Take 1 tablet (1 mg total) by mouth daily. 90 tablet 3  . atenolol (TENORMIN) 25 MG tablet Take 12.5 mg by mouth 3 (three) times daily. For migraine prevention    . baclofen  (LIORESAL) 10 MG tablet Take 10 mg by mouth daily as needed (migraines).     . Calcium Carbonate-Vitamin D 600-400 MG-UNIT tablet Take 2 tablets by mouth daily.     . cyanocobalamin 2000 MCG tablet Take 2,000 mcg by mouth daily.    . famotidine (PEPCID) 40 MG tablet Take 40 mg by mouth 2 (two) times daily.     . hydrOXYzine (ATARAX/VISTARIL)  25 MG tablet Take 1 tablet (25 mg total) by mouth 3 (three) times daily as needed. (Patient taking differently: Take 25 mg by mouth 2 (two) times daily. ) 30 tablet 0  . ketorolac (TORADOL) 10 MG tablet Take 1 tablet (10 mg total) by mouth every 6 (six) hours as needed. (Patient taking differently: Take 10 mg by mouth every 6 (six) hours as needed for moderate pain. ) 20 tablet 0  . levocetirizine (XYZAL) 5 MG tablet Take 1 tablet by mouth every evening.  3  . levothyroxine (SYNTHROID, LEVOTHROID) 75 MCG tablet TAKE ONE TABLET BY MOUTH ONE TIME DAILY  30 tablet 0  . Misc Natural Products (GLUCOSAMINE CHOND COMPLEX/MSM) TABS Take 1 tablet by mouth daily.     . Multiple Vitamin (MULTIVITAMIN WITH MINERALS) TABS tablet Take 1 tablet by mouth daily.    . nabumetone (RELAFEN) 500 MG tablet Take 500 mg by mouth daily as needed (migraines).     . Omega-3 Fatty Acids (FISH OIL) 1000 MG CAPS Take 1,000 mg by mouth daily.     . OnabotulinumtoxinA (BOTOX IJ) Inject 1 Dose as directed every 6 (six) weeks.     . ondansetron (ZOFRAN ODT) 4 MG disintegrating tablet Take 1 tablet (4 mg total) by mouth every 8 (eight) hours as needed. 15 tablet 0  . pantoprazole (PROTONIX) 40 MG tablet TAKE ONE TABLET BY MOUTH ONE TIME DAILY 30 tablet 5  . predniSONE (DELTASONE) 20 MG tablet Take 20 mg by mouth. Taper Dose as directed    . Probiotic Product (PROBIOTIC-10 PO) Take 1 capsule by mouth daily.     . promethazine (PHENERGAN) 25 MG tablet Take 12.5 mg by mouth daily as needed for nausea or vomiting. When having a migraine    . TURMERIC PO Take 1 tablet by mouth daily.     .  rizatriptan (MAXALT) 10 MG tablet Take 1 tablet (10 mg total) by mouth once as needed for up to 10 days for migraine. May repeat in 2 hours if needed 10 tablet 0   No current facility-administered medications on file prior to visit.     Review of Systems  Constitutional: Positive for fatigue. Negative for activity change, appetite change, fever and unexpected weight change.  HENT: Negative for congestion, ear pain, rhinorrhea, sinus pressure and sore throat.   Eyes: Negative for pain, redness and visual disturbance.  Respiratory: Negative for cough, shortness of breath and wheezing.   Cardiovascular: Negative for chest pain, palpitations and leg swelling.  Gastrointestinal: Positive for constipation. Negative for abdominal pain, blood in stool and diarrhea.  Endocrine: Positive for heat intolerance. Negative for polydipsia and polyuria.       Hot flashes  Genitourinary: Negative for dysuria, frequency, urgency, vaginal discharge and vaginal pain.  Musculoskeletal: Positive for arthralgias. Negative for back pain and myalgias.  Skin: Negative for pallor and rash.  Allergic/Immunologic: Negative for environmental allergies.  Neurological: Negative for dizziness, syncope, weakness, light-headedness and headaches.  Hematological: Negative for adenopathy. Does not bruise/bleed easily.  Psychiatric/Behavioral: Negative for decreased concentration and dysphoric mood. The patient is not nervous/anxious.        Stressors  Down mood occ       Objective:   Physical Exam Constitutional:      General: She is not in acute distress.    Appearance: Normal appearance. She is well-developed and normal weight. She is not ill-appearing.  HENT:     Head: Normocephalic and atraumatic.     Right Ear:  Tympanic membrane, ear canal and external ear normal.     Left Ear: Tympanic membrane, ear canal and external ear normal.     Nose: Nose normal.     Mouth/Throat:     Mouth: Mucous membranes are moist.      Pharynx: Oropharynx is clear.  Eyes:     General: No scleral icterus.       Right eye: No discharge.        Left eye: No discharge.     Conjunctiva/sclera: Conjunctivae normal.     Pupils: Pupils are equal, round, and reactive to light.  Neck:     Musculoskeletal: Normal range of motion and neck supple. No neck rigidity.     Thyroid: No thyromegaly.     Vascular: No carotid bruit or JVD.  Cardiovascular:     Rate and Rhythm: Normal rate and regular rhythm.     Pulses: Normal pulses.     Heart sounds: Normal heart sounds. No murmur. No gallop.   Pulmonary:     Effort: Pulmonary effort is normal. No respiratory distress.     Breath sounds: Normal breath sounds. No wheezing, rhonchi or rales.  Abdominal:     General: Bowel sounds are normal. There is no distension.     Palpations: Abdomen is soft. There is no mass.     Tenderness: There is no abdominal tenderness.     Comments: No HSM  Genitourinary:    Comments: Sees gyn for exam Musculoskeletal:        General: No tenderness.     Right lower leg: No edema.     Left lower leg: No edema.  Lymphadenopathy:     Cervical: No cervical adenopathy.  Skin:    General: Skin is warm and dry.     Capillary Refill: Capillary refill takes less than 2 seconds.     Coloration: Skin is not jaundiced or pale.     Findings: No erythema or rash.  Neurological:     Mental Status: She is alert.     Cranial Nerves: No cranial nerve deficit.     Motor: No abnormal muscle tone.     Coordination: Coordination normal.     Deep Tendon Reflexes: Reflexes are normal and symmetric.  Psychiatric:        Mood and Affect: Mood normal.           Assessment & Plan:   Problem List Items Addressed This Visit      Endocrine   Hypothyroidism    Hypothyroidism  Pt has no clinical changes No change in energy level/ hair or skin/ edema and no tremor Lab Results  Component Value Date   TSH 0.83 12/30/2018            Musculoskeletal and  Integument   Osteopenia    For dexa soon with gyn  No falls or fx  On ca and D Now on arimidex so fracture risk will increase She will disc plan with gyn        Other   Routine general medical examination at a health care facility - Primary    Reviewed health habits including diet and exercise and skin cancer prevention Reviewed appropriate screening tests for age  Also reviewed health mt list, fam hx and immunization status , as well as social and family history   See HPI Labs reviewed  utd gyn care with her gyn  Also cancer care with oncology Send for colonoscopy report  She is returning  to good health habits  dexa upcoming with gyn  LDL up a bit-will work on her diet         Prediabetes    Lab Results  Component Value Date   HGBA1C 6.0 12/30/2018   Surprisingly not up much in light of recent worse eating disc imp of low glycemic diet and wt loss to prevent DM2       Colon cancer screening    Pt had recent normal colonoscopy  Sent for report from her GI clinic/[facility]       B12 deficiency    Lab Results  Component Value Date   VITAMINB12 1,029 (H) 12/30/2018   Asked her to cut her B12 supplement to every other day      Malignant neoplasm of upper-inner quadrant of right breast in female, estrogen receptor positive ([place])    Doing fairly well overall Now getting used to arimidex side effects which is difficult  Will do MRIs for imaging in near future Continue oncol f/u      Relevant Medications   predniSONE (DELTASONE) 20 MG tablet   Elevated transaminase level    Mild with last labs 87 and 54 No etoh or acetaminophen intake  No h/o fatty liver Is in the midst of breast cancer tx -may be related No symptoms Will re check in 1 mo with hepatitis screen

## 2019-01-03 NOTE — Assessment & Plan Note (Signed)
Doing fairly well overall Now getting used to arimidex side effects which is difficult  Will do MRIs for imaging in near future Continue oncol f/u

## 2019-01-03 NOTE — Assessment & Plan Note (Signed)
Lab Results  Component Value Date   HGBA1C 6.0 12/30/2018   Surprisingly not up much in light of recent worse eating disc imp of low glycemic diet and wt loss to prevent DM2

## 2019-01-03 NOTE — Assessment & Plan Note (Signed)
Lab Results  Component Value Date   VITAMINB12 1,029 (H) 12/30/2018   Asked her to cut her B12 supplement to every other day

## 2019-01-03 NOTE — Assessment & Plan Note (Signed)
Hypothyroidism  Pt has no clinical changes No change in energy level/ hair or skin/ edema and no tremor Lab Results  Component Value Date   TSH 0.83 12/30/2018

## 2019-01-03 NOTE — Telephone Encounter (Signed)
Pt's CPE is today so Dr. Glori Bickers will address directly with pt

## 2019-01-03 NOTE — Assessment & Plan Note (Signed)
For dexa soon with gyn  No falls or fx  On ca and D Now on arimidex so fracture risk will increase She will disc plan with gyn

## 2019-01-03 NOTE — Assessment & Plan Note (Signed)
Pt had recent normal colonoscopy  Sent for report from her GI clinic/[facility]

## 2019-01-03 NOTE — Patient Instructions (Addendum)
Avoid red meat/ fried foods/ egg yolks/ fatty breakfast meats/ butter, cheese and high fat dairy/ and shellfish  -for cholesterol   Take a look at your extra B12 - take 1/2 the amount or take it every other day   Let's check liver function again in a month with some hepatitis screen  Avoid alcohol and tylenol products   Take care of yourself  Gradually get back to exercise

## 2019-01-03 NOTE — Assessment & Plan Note (Addendum)
Reviewed health habits including diet and exercise and skin cancer prevention Reviewed appropriate screening tests for age  Also reviewed health mt list, fam hx and immunization status , as well as social and family history   See HPI Labs reviewed  utd gyn care with her gyn  Also cancer care with oncology Send for colonoscopy report  She is returning to good health habits  dexa upcoming with gyn  LDL up a bit-will work on her diet

## 2019-01-06 ENCOUNTER — Telehealth: Payer: Self-pay

## 2019-01-06 ENCOUNTER — Telehealth: Payer: Self-pay | Admitting: Hematology and Oncology

## 2019-01-06 ENCOUNTER — Telehealth: Payer: Self-pay | Admitting: Adult Health

## 2019-01-06 NOTE — Telephone Encounter (Signed)
Patient returned phone call to schedule appt with provider, transferred to nurse.

## 2019-01-06 NOTE — Telephone Encounter (Signed)
Pt called to report that she has been experiencing a lot of fatigue, mental cloudiness, and sometimes dizziness. Pt wanted to see if this was due to radiation or letrozole. Pt finished radiation x 1 month ago and still has fatigue.   Told pt that fatigue level could increase about a month after radiation and sometimes last a minimum of 3 months or more, for some patients. It should decrease the level of fatigue overtime. Advised that pt remains hydrated, as well as incorporating daily exercises such as walking, yoga, or swimming to help increase blood flow and help combat fatigue. Pt states that she started taking letrozole x 2 weeks ago and states " I feel off and needing to sit down due to mild dizziness". Told pt to start taking medication right before bedtime, instead of during the day to help with preventing side effects while she is awake. Told pt that side effects of anti estrogen could be due to initial hormonal imbalance from starting medication. Told pt to monitor side effects closely for a few more weeks and call if she has worsening of side effects. Pt verbalized understanding.

## 2019-01-06 NOTE — Telephone Encounter (Signed)
Pt called to discuss her side effects with letrozole. Called pt back and lvm with call back number to see how she is doing with medication.

## 2019-01-06 NOTE — Telephone Encounter (Signed)
Left patient a message to call back if appt is needed.

## 2019-01-10 NOTE — Progress Notes (Signed)
  Radiation Oncology         [phone]) [phone] ________________________________  Name: Jennifer [name] MRN: [id number]  Date: 12/08/2018  DOB: [birth year]  End of Treatment Note  Diagnosis:   63 y.o. female with Stage IA, [id number], grade 1 ER/PR positive invasive ductal carcinoma of the right breast     Indication for treatment:  Curative       Radiation treatment dates:   11/07/2018 - 12/08/2018  Site/dose:    1. Right Breast / 42.56 Gy in 16 fractions 2. Right Breast Boost / 8 Gy in 4 fractions - Total dose 50.56 Gy  Beams/energy:    1. 3D / 6X Photon 2. 12E electron boost  Narrative: The patient tolerated radiation treatment relatively well.  Mild hyperpigmentation is present over her breast, and she is applying Radiaplex cream as directed. She also noted mild fatigue.  Plan: The patient has completed radiation treatment. The patient will return to radiation oncology clinic for routine followup in one month. I advised them to call or return sooner if they have any questions or concerns related to their recovery or treatment.  ------------------------------------------------  Jodelle Gross, MD, PhD  This document serves as a record of services personally performed by Kyung Rudd, MD. It was created on his behalf by Rae Lips, a trained medical scribe. The creation of this record is based on the scribe's personal observations and the provider's statements to them. This document has been checked and approved by the attending provider.

## 2019-01-16 ENCOUNTER — Other Ambulatory Visit: Payer: Self-pay | Admitting: Family Medicine

## 2019-01-25 DIAGNOSIS — M791 Myalgia, unspecified site: Secondary | ICD-10-CM | POA: Diagnosis not present

## 2019-01-25 DIAGNOSIS — G43019 Migraine without aura, intractable, without status migrainosus: Secondary | ICD-10-CM | POA: Diagnosis not present

## 2019-01-25 DIAGNOSIS — M542 Cervicalgia: Secondary | ICD-10-CM | POA: Diagnosis not present

## 2019-01-25 DIAGNOSIS — G43719 Chronic migraine without aura, intractable, without status migrainosus: Secondary | ICD-10-CM | POA: Diagnosis not present

## 2019-01-25 DIAGNOSIS — G518 Other disorders of facial nerve: Secondary | ICD-10-CM | POA: Diagnosis not present

## 2019-02-01 ENCOUNTER — Other Ambulatory Visit: Payer: BLUE CROSS/BLUE SHIELD

## 2019-02-08 ENCOUNTER — Telehealth: Payer: Self-pay | Admitting: Radiation Oncology

## 2019-02-08 NOTE — Telephone Encounter (Signed)
I spoke with the patient by phone today regarding the need to cancel her nonurgent medical follow-up appointment with me next week.  We discussed skin care, considerations to avoid sun exposure to her skin, and typical scenarios following radiotherapy.  At the conclusion of our discussion, she is satisfied with our encounter, she is planning to follow-up in survivorship clinic in May, and is anticipating her mammogram being due around that time.  She states that she has been tolerating her symptoms of antiestrogen therapy rather well, and the dizziness she first experienced has now resolved.  We will see her back as needed moving forward, and encouraged her to call if she has any questions or concerns.

## 2019-02-13 ENCOUNTER — Ambulatory Visit: Payer: Self-pay | Admitting: Radiation Oncology

## 2019-02-17 ENCOUNTER — Telehealth: Payer: Self-pay

## 2019-02-17 MED ORDER — PREDNISONE 20 MG PO TABS: ORAL_TABLET | ORAL | 0 refills | Status: DC

## 2019-02-17 NOTE — Telephone Encounter (Signed)
Pt notified Rx sent and she will stay home if she takes med

## 2019-02-17 NOTE — Telephone Encounter (Signed)
Pt is having migraines. She usually gets prednisone from her neurologist but they are closed. Asking if Dr Glori Bickers would write her the usual prednisone taper she gets from them.

## 2019-02-17 NOTE — Telephone Encounter (Signed)
I sent it  

## 2019-02-17 NOTE — Telephone Encounter (Signed)
Pt said she usually gets Prednisone 20 tabs. Day one 80mg , 2nd day 60 mg, 3rd day 40mg , 4 day 20mg  then done. 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 Glori Bickers does prescribed the prednisone she will stay home those days she is on it because she is aware how it suppresses her immune function. Right now pt said her migraine is a typical mild one but that's because she tries to take her meds right when it starts before the sxs get really bad. Pt said her neurologist office closes at 1pm on Fridays so she couldn't get in touch with them. Pt said she is trying to avoid getting a severe HA due to her not being able to get injections right now that also help her HA (due to Covid-19), also she is afraid that if it gets worse she will have to go to ER and given history she is trying to avoid going to ER so pt is requesting meds sent to [facility]  Dr. Orie Rout,  Neurologist with [facility] in [place]

## 2019-02-17 NOTE — Telephone Encounter (Signed)
What dose/taper does she usually get  How bad is her migraine? Is this a typical one for her?  Who is her migraine doctor   We want to be careful with prednisone during covid outbreak because it does decrease immune function temporarily- is she working form home or out with the public

## 2019-02-18 ENCOUNTER — Other Ambulatory Visit: Payer: Self-pay

## 2019-02-18 ENCOUNTER — Encounter: Payer: Self-pay | Admitting: Emergency Medicine

## 2019-02-18 ENCOUNTER — Emergency Department
Admission: EM | Admit: 2019-02-18 | Discharge: 2019-02-18 | Disposition: A | Discharge status: Home / Self Care | Payer: BLUE CROSS/BLUE SHIELD | Attending: Emergency Medicine | Admitting: Emergency Medicine

## 2019-02-18 DIAGNOSIS — Z79899 Other long term (current) drug therapy: Secondary | ICD-10-CM | POA: Diagnosis not present

## 2019-02-18 DIAGNOSIS — Z853 Personal history of malignant neoplasm of breast: Secondary | ICD-10-CM | POA: Insufficient documentation

## 2019-02-18 DIAGNOSIS — G43909 Migraine, unspecified, not intractable, without status migrainosus: Secondary | ICD-10-CM | POA: Insufficient documentation

## 2019-02-18 DIAGNOSIS — R51 Headache: Secondary | ICD-10-CM | POA: Diagnosis not present

## 2019-02-18 DIAGNOSIS — E039 Hypothyroidism, unspecified: Secondary | ICD-10-CM | POA: Insufficient documentation

## 2019-02-18 DIAGNOSIS — G43911 Migraine, unspecified, intractable, with status migrainosus: Secondary | ICD-10-CM | POA: Diagnosis not present

## 2019-02-18 MED ORDER — SODIUM CHLORIDE 0.9 % IV BOLUS
1000.0000 mL | Freq: Once | INTRAVENOUS | Status: AC
  Administered 2019-02-18: 1000 mL via INTRAVENOUS

## 2019-02-18 MED ORDER — ONDANSETRON HCL 4 MG/2ML IJ SOLN
4.0000 mg | Freq: Once | INTRAMUSCULAR | Status: AC
  Administered 2019-02-18: 4 mg via INTRAVENOUS
  Filled 2019-02-18: qty 2

## 2019-02-18 MED ORDER — KETOROLAC TROMETHAMINE 30 MG/ML IJ SOLN
30.0000 mg | Freq: Once | INTRAMUSCULAR | Status: AC
  Administered 2019-02-18: 30 mg via INTRAVENOUS
  Filled 2019-02-18: qty 1

## 2019-02-18 NOTE — ED Notes (Signed)
Pt verbalized understanding of discharge instructions. NAD at this time. 

## 2019-02-18 NOTE — ED Notes (Signed)
Pt able to look at phone screen and reports she no longer has photosensitivity. Pt reports she feels "much better"

## 2019-02-18 NOTE — ED Triage Notes (Signed)
Pt arrives POV to triage with c/o migraine at 1700. Pt does say that she is taking her migraine medication without success. Pt is in NAD.

## 2019-02-18 NOTE — ED Notes (Signed)
Pt sitting in wheelchair in waiting room, texting on cell phone; asked pt if this is her typical migraine to which she replied "yes"

## 2019-02-18 NOTE — ED Provider Notes (Signed)
[facility] Emergency Department Provider Note  ____________________________________________  Time seen: Approximately 9:46 PM  I have reviewed the triage vital signs and the nursing notes.   HISTORY  Chief Complaint Migraine    HPI Jennifer [name] is a 63 y.o. female with a history of migraines, presents to the emergency department with frontal headache that is 2 out of 10 in intensity.  Patient reports that her headache feels like similar migraines in the past.  Patient states that she has had approximately 6 migraines today.  She has been taking her normal headache medicine but states that she cannot get headache to completely stop.  She denies nausea.  She has had some photophobia but no phonophobia.  Patient denies this being the worst headache of her life.  No other alleviating measures have been attempted.   Past Medical History:  Diagnosis Date  . Allergic rhinitis   . Anxiety   . Arthritis    hands, knees  . Cancer ([place]) 09/2018   Breast CA new DX, right breast  . Cervical dysplasia   . Endometriosis   . Esophageal reflux   . Family history of breast cancer   . Hypothyroid   . Migraine with aura, without mention of intractable migraine without mention of status migrainosus   . Osteopenia 11/2016   T score -2.2 FRAX 9.9%/1.6%  . Pre-diabetes     Patient Active Problem List   Diagnosis Date Noted  . Elevated transaminase level 01/03/2019  . Family history of breast cancer   . Stress reaction 09/26/2018  . Malignant neoplasm of upper-inner quadrant of right breast in female, estrogen receptor positive ([place]) 09/20/2018  . B12 deficiency 04/13/2018  . Joint pain 04/11/2018  . Skin pain 04/11/2018  . Paresthesia 04/11/2018  . Colon cancer screening 09/16/2017  . Prediabetes 07/28/2016  . Allergic rhinitis 11/13/2014  . Screening for lipoid disorders 09/26/2014  . Preventative health care 09/26/2014  . Routine general medical examination at a  health care facility 10/28/2012  . Herpes labialis 04/28/2011  . GERD 09/16/2010  . Hypothyroidism 05/07/2008  . Osteopenia 05/07/2008  . Migraine with aura 11/11/2007    Past Surgical History:  Procedure Laterality Date  . BREAST EXCISIONAL BIOPSY Bilateral over 10 years ago   benign x 2   . BREAST LUMPECTOMY WITH RADIOACTIVE SEED AND SENTINEL LYMPH NODE BIOPSY Right 10/10/2018   Procedure: RIGHT BREAST LUMPECTOMY WITH RADIOACTIVE SEED AND RIGHT SENTINEL LYMPH NODE BIOPSY;  Surgeon: Jovita Kussmaul, MD;  Location: [facility];  Service: General;  Laterality: Right;  . BREAST SURGERY     Benign breast lump excised  . CERVICAL CONE BIOPSY  1985   severe dysplasia  . COLONOSCOPY  2005   normal  . COLPOSCOPY    . ROTATOR CUFF REPAIR  08/2008    Prior to Admission medications   Medication Sig Start Date End Date Taking? Authorizing Provider  ALPRAZolam (XANAX) 0.5 MG tablet TAKE 1/2 TO 1 TABLET BY MOUTH ONCE DAILY AS NEEDED FOR SLEEP/ANXIETY 11/22/18   Tower, [name], MD  anastrozole (ARIMIDEX) 1 MG tablet Take 1 tablet (1 mg total) by mouth daily. 12/28/18   Nicholas Lose, MD  atenolol (TENORMIN) 25 MG tablet Take 12.5 mg by mouth 3 (three) times daily. For migraine prevention    [provider]  baclofen (LIORESAL) 10 MG tablet Take 10 mg by mouth daily as needed (migraines).     [provider]  Calcium Carbonate-Vitamin D 600-400  MG-UNIT tablet Take 2 tablets by mouth daily.     [provider]  cyanocobalamin 2000 MCG tablet Take 2,000 mcg by mouth daily.    [provider]  famotidine (PEPCID) 40 MG tablet Take 40 mg by mouth 2 (two) times daily.     [provider]  hydrOXYzine (ATARAX/VISTARIL) 25 MG tablet Take 1 tablet (25 mg total) by mouth 3 (three) times daily as needed. Patient taking differently: Take 25 mg by mouth 2 (two) times daily.  10/18/17   Triplett, Johnette Abraham B, FNP  ketorolac (TORADOL) 10 MG tablet Take 1  tablet (10 mg total) by mouth every 6 (six) hours as needed. Patient taking differently: Take 10 mg by mouth every 6 (six) hours as needed for moderate pain.  06/07/17   Gregor Hams, MD  levocetirizine (XYZAL) 5 MG tablet Take 1 tablet by mouth every evening. 10/28/17   [provider]  levothyroxine (SYNTHROID, LEVOTHROID) 75 MCG tablet TAKE ONE TABLET BY MOUTH ONE TIME DAILY  01/17/19   Tower, [name], MD  Misc Natural Products (GLUCOSAMINE CHOND COMPLEX/MSM) TABS Take 1 tablet by mouth daily.     [provider]  Multiple Vitamin (MULTIVITAMIN WITH MINERALS) TABS tablet Take 1 tablet by mouth daily.    [provider]  nabumetone (RELAFEN) 500 MG tablet Take 500 mg by mouth daily as needed (migraines).     [provider]  Omega-3 Fatty Acids (FISH OIL) 1000 MG CAPS Take 1,000 mg by mouth daily.     [provider]  OnabotulinumtoxinA (BOTOX IJ) Inject 1 Dose as directed every 6 (six) weeks.     [provider]  ondansetron (ZOFRAN ODT) 4 MG disintegrating tablet Take 1 tablet (4 mg total) by mouth every 8 (eight) hours as needed. 11/03/18   Menshew, Dannielle Karvonen, PA-C  pantoprazole (PROTONIX) 40 MG tablet TAKE ONE TABLET BY MOUTH ONE TIME DAILY 11/21/18   Tower, [name], MD  predniSONE (DELTASONE) 20 MG tablet Take 4 pills by mouth today, then 3 pills on Saturday, 2 pills on Sunday and one pill on Monday then stop 02/17/19   Tower, [name], MD  Probiotic Product (PROBIOTIC-10 PO) Take 1 capsule by mouth daily.     [provider]  promethazine (PHENERGAN) 25 MG tablet Take 12.5 mg by mouth daily as needed for nausea or vomiting. When having a migraine    [provider]  rizatriptan (MAXALT) 10 MG tablet Take 1 tablet (10 mg total) by mouth once as needed for up to 10 days for migraine. May repeat in 2 hours if needed 11/03/18 11/13/18  Menshew, Dannielle Karvonen, PA-C  TURMERIC PO Take 1 tablet by mouth daily.     [provider]    Allergies Ciprofloxacin; Codeine phosphate; Decongestant [pseudoephedrine]; Flonase [fluticasone propionate]; Hydrocodone; Reglan [metoclopramide]; Topamax [topiramate]; Benadryl [diphenhydramine hcl]; and Sulfa antibiotics  Family History  Problem Relation Age of Onset  . Breast cancer Mother 53  . Diabetes Mother   . Hypertension Father   . Heart disease Father   . Osteoporosis Maternal Grandmother   . Diabetes Maternal Grandmother   . Hypertension Sister   . COPD Paternal Grandmother   . Heart disease Paternal Grandfather   . Breast cancer Cousin 4       pat first cousin  . Breast cancer Cousin        mat second cousin with breast cancer in her 23s    Social History  Social History   Tobacco Use  . Smoking status: Never Smoker  . Smokeless tobacco: Never Used  Substance Use Topics  . Alcohol use: No    Alcohol/week: 0.0 standard drinks  . Drug use: No     Review of Systems  Constitutional: No fever/chills Eyes: No visual changes. No discharge ENT: No upper respiratory complaints. Cardiovascular: no chest pain. Respiratory: no cough. No SOB. Gastrointestinal: No abdominal pain.  No nausea, no vomiting.  No diarrhea.  No constipation. Musculoskeletal: Negative for musculoskeletal pain. Skin: Negative for rash, abrasions, lacerations, ecchymosis. Neurological: Patient has headache, no focal weakness or numbness.   ____________________________________________   PHYSICAL EXAM:  VITAL SIGNS: ED Triage Vitals  Enc Vitals Group     BP 02/18/19 2057 132/66     Pulse Rate 02/18/19 2057 66     Resp 02/18/19 2057 20     Temp 02/18/19 2057 98.1 F (36.7 C)     Temp src --      SpO2 02/18/19 2057 97 %     Weight 02/18/19 2054 125 lb (56.7 kg)     Height 02/18/19 2054 5\' 1"  (1.549 m)     Head Circumference --      Peak Flow --      Pain Score 02/18/19 2054 2     Pain Loc --      Pain Edu? --      Excl. in [place]? --      Constitutional:  Alert and oriented. Well appearing and in no acute distress. Eyes: Conjunctivae are normal. PERRL. EOMI. Head: Atraumatic. ENT:      Ears: TMs are pearly.       Nose: No congestion/rhinnorhea.      Mouth/Throat: Mucous membranes are moist.  Neck: No stridor.  No cervical spine tenderness to palpation. Cardiovascular: Normal rate, regular rhythm. Normal S1 and S2.  Good peripheral circulation. Respiratory: Normal respiratory effort without tachypnea or retractions. Lungs CTAB. Good air entry to the bases with no decreased or absent breath sounds. Musculoskeletal: Full range of motion to all extremities. No gross deformities appreciated. Neurologic:  Normal speech and language. No gross focal neurologic deficits are appreciated.  Skin:  Skin is warm, dry and intact. No rash noted. Psychiatric: Mood and affect are normal. Speech and behavior are normal. Patient exhibits appropriate insight and judgement.   ____________________________________________   LABS (all labs ordered are listed, but only abnormal results are displayed)  Labs Reviewed - No data to display ____________________________________________  EKG   ____________________________________________  RADIOLOGY   No results found.  ____________________________________________    PROCEDURES  Procedure(s) performed:    Procedures    Medications  sodium chloride 0.9 % bolus 1,000 mL (0 mLs Intravenous Stopped 02/18/19 2300)  ketorolac (TORADOL) 30 MG/ML injection 30 mg (30 mg Intravenous Given 02/18/19 2159)  ondansetron (ZOFRAN) injection 4 mg (4 mg Intravenous Given 02/18/19 2159)     ____________________________________________   INITIAL IMPRESSION / ASSESSMENT AND PLAN / ED COURSE  Pertinent labs & imaging results that were available during my care of the patient were reviewed by me and considered in my medical decision making (see chart for details).  Review of the [place] CSRS was performed in accordance of  the [place] prior to dispensing any controlled drugs.      Assessment and Plan:  Migraine:  Patient presents to the emergency department with complaint of migraine.  Patient reports that her current symptoms feel like similar migraines in the past.  Neurologic  exam was reassuring in the emergency department.  Patient was given Toradol, Decadron and supplemental fluids and patient reports that her migraine resolved.  All patient questions were answered prior to discharge.   ____________________________________________  FINAL CLINICAL IMPRESSION(S) / ED DIAGNOSES  Final diagnoses:  Migraine without status migrainosus, not intractable, unspecified migraine type      NEW MEDICATIONS STARTED DURING THIS VISIT:  ED Discharge Orders    None          This chart was dictated using voice recognition software/Dragon. Despite best efforts to proofread, errors can occur which can change the meaning. Any change was purely unintentional.    Lannie Fields, PA-C 02/18/19 [place], [place], MD 02/19/19 Bosie Helper

## 2019-02-18 NOTE — ED Notes (Signed)
Pt here for c/o migraine; took Triptan which she thinks is making her feel dizzy; pt assisted with wheelchair;

## 2019-02-20 DIAGNOSIS — M542 Cervicalgia: Secondary | ICD-10-CM | POA: Diagnosis not present

## 2019-02-20 DIAGNOSIS — G518 Other disorders of facial nerve: Secondary | ICD-10-CM | POA: Diagnosis not present

## 2019-02-20 DIAGNOSIS — G43719 Chronic migraine without aura, intractable, without status migrainosus: Secondary | ICD-10-CM | POA: Diagnosis not present

## 2019-02-20 DIAGNOSIS — M791 Myalgia, unspecified site: Secondary | ICD-10-CM | POA: Diagnosis not present

## 2019-02-20 DIAGNOSIS — G43019 Migraine without aura, intractable, without status migrainosus: Secondary | ICD-10-CM | POA: Diagnosis not present

## 2019-02-26 DIAGNOSIS — G43719 Chronic migraine without aura, intractable, without status migrainosus: Secondary | ICD-10-CM | POA: Diagnosis not present

## 2019-03-08 DIAGNOSIS — G43719 Chronic migraine without aura, intractable, without status migrainosus: Secondary | ICD-10-CM | POA: Diagnosis not present

## 2019-03-08 DIAGNOSIS — M791 Myalgia, unspecified site: Secondary | ICD-10-CM | POA: Diagnosis not present

## 2019-03-08 DIAGNOSIS — M542 Cervicalgia: Secondary | ICD-10-CM | POA: Diagnosis not present

## 2019-03-08 DIAGNOSIS — G43019 Migraine without aura, intractable, without status migrainosus: Secondary | ICD-10-CM | POA: Diagnosis not present

## 2019-03-13 ENCOUNTER — Telehealth: Payer: Self-pay | Admitting: Adult Health

## 2019-03-13 NOTE — Telephone Encounter (Signed)
Rescheduled 5/15 to 5/13 per sch msg and changed to webex. Called patient. No answer. Left msg explaining changes.

## 2019-03-15 ENCOUNTER — Other Ambulatory Visit: Payer: Self-pay | Admitting: Family Medicine

## 2019-03-15 ENCOUNTER — Encounter: Payer: Self-pay | Admitting: Family Medicine

## 2019-03-15 DIAGNOSIS — R7303 Prediabetes: Secondary | ICD-10-CM

## 2019-03-15 MED ORDER — GLUCOSE BLOOD VI STRP: ORAL_STRIP | 6 refills | Status: DC

## 2019-03-15 MED ORDER — ACCU-CHEK SOFT TOUCH LANCETS MISC: 6 refills | Status: DC

## 2019-03-15 NOTE — Telephone Encounter (Signed)
Last office visit 01/03/2019 for CPE.  Last refilled:  Alprazolam 11/22/2018 for #15 with no refills.  Prednisone 02/17/2019 for #10 with no refills.  No future appointments.

## 2019-03-15 NOTE — Telephone Encounter (Signed)
I assume the prednisone is for migraine and I am concerned because she just had it last month  Has she worsened? Improved? Better and then worse?   Is her neuro (headache clinic)  office completely closed (ie: not taking calls at all or refilling meds?)  I think most neuro offices are still open  I just don't want her to get to immuno suppressed so let me know what the situation is before I refill  Thanks

## 2019-03-16 NOTE — Telephone Encounter (Signed)
2nd attempt made to reach patient.  VM is full, unable to leave message.

## 2019-03-16 NOTE — Telephone Encounter (Signed)
I called pt, no answer and VM is full.. will try again later

## 2019-03-17 MED ORDER — ALPRAZOLAM 0.5 MG PO TABS: ORAL_TABLET | ORAL | 0 refills | Status: DC

## 2019-03-17 NOTE — Telephone Encounter (Signed)
Was able to get through and leave a message (okay per dpr) on patients voicemail today.  I have asked that she please call us back as soon as possible as we have questions about her medications that we received refill requests for and need answers before we can fill.

## 2019-03-17 NOTE — Telephone Encounter (Signed)
Have not heard back from patient regarding prednisone, she knew to call back before 4 if unable to reach neuro.  I am assuming they are addressing the prednisone.   However, she is still requesting refill on alprazolam, can we fill for her? For sleep and anxiety?  Last refill #15, 0 refills on 11/22/18  Please advise. Thanks.

## 2019-03-17 NOTE — Telephone Encounter (Signed)
Agreed-if she cannot get in touch send back to me

## 2019-03-17 NOTE — Telephone Encounter (Signed)
Spoke with patient regarding refills.  She is following with her neurologist very closely and, in fact, just saw them on Wednesday for Botox treatments.  The prednisone refill is for her migraines and her pattern has increase as of late.  They do get better then worse again.  She thinks with the stress and weather changes right now it has contributed to an increase in frequency. She says her neurologist knows that Jennifer [name] prescribed her prednisone before.  She is experiencing migraines this week and would like a refill on prednisone prior to the weekend.    I explained the risks associated with immunosuppression and it would be best for her neurologist to manage this aspect of her care and she should communicate prednisone request to them.    She is concerned they will not get back with her before noon when they close as it is 11am now.  I encouraged her to please contact them now and let them know her situation.  They would be the best to treat at this point.  Patient will call back if unable to contact.  Will forward back to Jennifer [name] as Jennifer [name] and to determine if alprazolam can be filled in the interim.

## 2019-03-17 NOTE — Telephone Encounter (Signed)
Patient is returning your call    She stated this was the best number to reach her at,  PHONE- [phone]

## 2019-03-20 MED ORDER — ACCU-CHEK MULTICLIX LANCETS MISC: 6 refills | Status: DC

## 2019-03-20 NOTE — Addendum Note (Signed)
Addended by: Carter Kitten on: 03/20/2019 09:43 AM   Modules accepted: Orders

## 2019-03-21 ENCOUNTER — Other Ambulatory Visit: Payer: Self-pay

## 2019-03-21 ENCOUNTER — Ambulatory Visit (INDEPENDENT_AMBULATORY_CARE_PROVIDER_SITE_OTHER): Payer: BLUE CROSS/BLUE SHIELD | Admitting: Family Medicine

## 2019-03-21 ENCOUNTER — Telehealth: Payer: Self-pay

## 2019-03-21 ENCOUNTER — Encounter: Payer: Self-pay | Admitting: Family Medicine

## 2019-03-21 ENCOUNTER — Ambulatory Visit
Admission: RE | Admit: 2019-03-21 | Discharge: 2019-03-21 | Disposition: A | Discharge status: Home / Self Care | Payer: BLUE CROSS/BLUE SHIELD | Source: Ambulatory Visit | Attending: Family Medicine | Admitting: Family Medicine

## 2019-03-21 VITALS — BP 114/70 | HR 63 | Temp 98.0°F | Ht 60.25 in | Wt 127.0 lb

## 2019-03-21 DIAGNOSIS — C50211 Malignant neoplasm of upper-inner quadrant of right female breast: Secondary | ICD-10-CM

## 2019-03-21 DIAGNOSIS — Z170 Estrogen receptor positive status [ER+]: Secondary | ICD-10-CM | POA: Insufficient documentation

## 2019-03-21 DIAGNOSIS — M79662 Pain in left lower leg: Secondary | ICD-10-CM | POA: Insufficient documentation

## 2019-03-21 DIAGNOSIS — Z79811 Long term (current) use of aromatase inhibitors: Secondary | ICD-10-CM | POA: Insufficient documentation

## 2019-03-21 NOTE — Assessment & Plan Note (Signed)
Puts her at greater risk of blood clot Sent for venous US LLE to look for DVT

## 2019-03-21 NOTE — Progress Notes (Signed)
Subjective:    Patient ID: Jennifer [name], female    DOB: 08/28/1956, 63 y.o.   MRN: [id number]  HPI Here for leg pain   2 am she noticed pain behind her L knee  Could not get comfortable in bed  3/10 in intensity This am - overall improved but she feels discomfort in her calf and foot  Feels like it is going to cramp /not actively cramping   Cannot tell if she is swollen  Her ankle/foot feel a little numb  Just a little pain in L calf   No new activity / no trauma or strain to the leg  Exercise- recumbent bike/treadmill - nothing she has not been doing at all   No rash   No fever No sob  No cp  Feels ok   No redness or warmth    She takes arimidex for breast cancer treated 11/19  BP Readings from Last 3 Encounters:  03/21/19 114/70  02/18/19 118/67  01/03/19 112/74   Pulse Readings from Last 3 Encounters:  03/21/19 63  02/18/19 (!) 57  01/03/19 (!) 58   Pt was sent for venous US of LLE today  ADDEND: -result is negative for DVT US Venous Img Lower Unilateral Left  Result Date: 03/21/2019 CLINICAL DATA:  Left calf pain x1 day EXAM: LEFT LOWER EXTREMITY VENOUS DOPPLER ULTRASOUND TECHNIQUE: Gray-scale sonography with compression, as well as color and duplex ultrasound, were performed to evaluate the deep venous system from the level of the common femoral vein through the popliteal and proximal calf veins. COMPARISON:  None FINDINGS: Normal compressibility of the common femoral, superficial femoral, and popliteal veins, as well as the proximal calf veins. No filling defects to suggest DVT on grayscale or color Doppler imaging. Doppler waveforms show normal direction of venous flow, normal respiratory phasicity and response to augmentation. Survey views of the contralateral common femoral vein are unremarkable. IMPRESSION: No femoropopliteal and no calf DVT in the visualized calf veins. If clinical symptoms are inconsistent or if there are persistent or worsening symptoms,  further imaging (possibly involving the iliac veins) may be warranted. Electronically Signed   By: Lucrezia Europe M.D.   On: 03/21/2019 15:07     Patient Active Problem List   Diagnosis Date Noted   Pain of left calf 03/21/2019   Use of anastrozole (Arimidex) 03/21/2019   Elevated transaminase level 01/03/2019   Family history of breast cancer    Stress reaction 09/26/2018   Malignant neoplasm of upper-inner quadrant of right breast in female, estrogen receptor positive ([place]) 09/20/2018   B12 deficiency 04/13/2018   Joint pain 04/11/2018   Skin pain 04/11/2018   Paresthesia 04/11/2018   Colon cancer screening 09/16/2017   Prediabetes 07/28/2016   Allergic rhinitis 11/13/2014   Screening for lipoid disorders 09/26/2014   Preventative health care 09/26/2014   Routine general medical examination at a health care facility 10/28/2012   Herpes labialis 04/28/2011   GERD 09/16/2010   Hypothyroidism 05/07/2008   Osteopenia 05/07/2008   Migraine with aura 11/11/2007   Past Medical History:  Diagnosis Date   Allergic rhinitis    Anxiety    Arthritis    hands, knees   Cancer ([place]) 09/2018   Breast CA new DX, right breast   Cervical dysplasia    Endometriosis    Esophageal reflux    Family history of breast cancer    Hypothyroid    Migraine with aura, without mention of intractable migraine without mention of  status migrainosus    Osteopenia 11/2016   T score -2.2 FRAX 9.9%/1.6%   Pre-diabetes    Past Surgical History:  Procedure Laterality Date   BREAST EXCISIONAL BIOPSY Bilateral over 10 years ago   benign x 2    BREAST LUMPECTOMY WITH RADIOACTIVE SEED AND SENTINEL LYMPH NODE BIOPSY Right 10/10/2018   Procedure: RIGHT BREAST LUMPECTOMY WITH RADIOACTIVE SEED AND RIGHT SENTINEL LYMPH NODE BIOPSY;  Surgeon: Jovita Kussmaul, MD;  Location: [facility];  Service: General;  Laterality: Right;   BREAST SURGERY     Benign breast lump  excised   CERVICAL CONE BIOPSY  1985   severe dysplasia   COLONOSCOPY  2005   normal   COLPOSCOPY     ROTATOR CUFF REPAIR  08/2008   Social History   Tobacco Use   Smoking status: Never Smoker   Smokeless tobacco: Never Used  Substance Use Topics   Alcohol use: No    Alcohol/week: 0.0 standard drinks   Drug use: No   Family History  Problem Relation Age of Onset   Breast cancer Mother 25   Diabetes Mother    Hypertension Father    Heart disease Father    Osteoporosis Maternal Grandmother    Diabetes Maternal Grandmother    Hypertension Sister    COPD Paternal Grandmother    Heart disease Paternal Grandfather    Breast cancer Cousin 13       pat first cousin   Breast cancer Cousin        mat second cousin with breast cancer in her 71s   Allergies  Allergen Reactions   Ciprofloxacin Other (See Comments)    Dizziness    Codeine Phosphate Other (See Comments)    Dizziness, heart palpitations, nervous   Decongestant [Pseudoephedrine] Other (See Comments)    dizziness   Flonase [Fluticasone Propionate] Other (See Comments)    Worsens her migraine    Hydrocodone Other (See Comments)    Made nervous--10/09 surgery rotator cuff   Reglan [Metoclopramide] Other (See Comments)    Dizziness   Topamax [Topiramate] Other (See Comments)    Dizziness    Benadryl [Diphenhydramine Hcl] Palpitations   Sulfa Antibiotics Anxiety   Current Outpatient Medications on File Prior to Visit  Medication Sig Dispense Refill   ALPRAZolam (XANAX) 0.5 MG tablet TAKE 1/2 TO 1 TABLET BY MOUTH ONCE DAILY AS NEEDED FOR SLEEP/ANXIETY 15 tablet 0   anastrozole (ARIMIDEX) 1 MG tablet Take 1 tablet (1 mg total) by mouth daily. 90 tablet 3   atenolol (TENORMIN) 25 MG tablet Take 12.5 mg by mouth 3 (three) times daily. For migraine prevention     baclofen (LIORESAL) 10 MG tablet Take 10 mg by mouth daily as needed (migraines).      Calcium Carbonate-Vitamin D 600-400  MG-UNIT tablet Take 2 tablets by mouth daily.      cyanocobalamin 2000 MCG tablet Take 2,000 mcg by mouth daily.     famotidine (PEPCID) 40 MG tablet Take 40 mg by mouth 2 (two) times daily.      glucose blood (TRUE METRIX BLOOD GLUCOSE TEST) test strip Use to check blood sugar two times a day. 100 each 6   hydrOXYzine (ATARAX/VISTARIL) 25 MG tablet Take 1 tablet (25 mg total) by mouth 3 (three) times daily as needed. (Patient taking differently: Take 25 mg by mouth 2 (two) times daily. ) 30 tablet 0   ketorolac (TORADOL) 10 MG tablet Take 1 tablet (10 mg total) by  mouth every 6 (six) hours as needed. (Patient taking differently: Take 10 mg by mouth every 6 (six) hours as needed for moderate pain. ) 20 tablet 0   Lancets (ACCU-CHEK MULTICLIX) lancets Use to check blood sugar two times a day. 100 each 6   levocetirizine (XYZAL) 5 MG tablet Take 1 tablet by mouth every evening.  3   levothyroxine (SYNTHROID, LEVOTHROID) 75 MCG tablet TAKE ONE TABLET BY MOUTH ONE TIME DAILY  30 tablet 5   Misc Natural Products (GLUCOSAMINE CHOND COMPLEX/MSM) TABS Take 1 tablet by mouth daily.      Multiple Vitamin (MULTIVITAMIN WITH MINERALS) TABS tablet Take 1 tablet by mouth daily.     nabumetone (RELAFEN) 500 MG tablet Take 500 mg by mouth daily as needed (migraines).      Omega-3 Fatty Acids (FISH OIL) 1000 MG CAPS Take 1,000 mg by mouth daily.      OnabotulinumtoxinA (BOTOX IJ) Inject 1 Dose as directed every 6 (six) weeks.      ondansetron (ZOFRAN ODT) 4 MG disintegrating tablet Take 1 tablet (4 mg total) by mouth every 8 (eight) hours as needed. 15 tablet 0   pantoprazole (PROTONIX) 40 MG tablet TAKE ONE TABLET BY MOUTH ONE TIME DAILY 30 tablet 5   predniSONE (DELTASONE) 20 MG tablet Take 4 pills by mouth today, then 3 pills on Saturday, 2 pills on Sunday and one pill on Monday then stop 10 tablet 0   Probiotic Product (PROBIOTIC-10 PO) Take 1 capsule by mouth daily.      promethazine  (PHENERGAN) 25 MG tablet Take 12.5 mg by mouth daily as needed for nausea or vomiting. When having a migraine     TURMERIC PO Take 1 tablet by mouth daily.      rizatriptan (MAXALT) 10 MG tablet Take 1 tablet (10 mg total) by mouth once as needed for up to 10 days for migraine. May repeat in 2 hours if needed 10 tablet 0   No current facility-administered medications on file prior to visit.     Review of Systems  Constitutional: Negative for activity change, appetite change, fatigue, fever and unexpected weight change.  HENT: Negative for congestion, ear pain, rhinorrhea, sinus pressure and sore throat.   Eyes: Negative for pain, redness and visual disturbance.  Respiratory: Negative for cough, chest tightness, shortness of breath and wheezing.   Cardiovascular: Negative for chest pain and palpitations.  Gastrointestinal: Negative for abdominal pain, blood in stool, constipation and diarrhea.  Endocrine: Negative for polydipsia and polyuria.  Genitourinary: Negative for dysuria, frequency and urgency.  Musculoskeletal: Positive for arthralgias and myalgias. Negative for back pain and joint swelling.       Here for pain of L posterior knee and calf   Does note increased aches/pains in general with arimidex and menopause  Skin: Negative for pallor and rash.  Allergic/Immunologic: Negative for environmental allergies.  Neurological: Positive for headaches. Negative for syncope, facial asymmetry and light-headedness.       Sensation is "funny feeling" in L foot today-not tingly or totally numb  Hematological: Negative for adenopathy. Does not bruise/bleed easily.  Psychiatric/Behavioral: Negative for decreased concentration and dysphoric mood. The patient is not nervous/anxious.        Stressors        Objective:   Physical Exam Constitutional:      General: She is not in acute distress.    Appearance: Normal appearance. She is well-developed and normal weight. She is not ill-appearing  or diaphoretic.  HENT:     Head: Normocephalic and atraumatic.     Mouth/Throat:     Mouth: Mucous membranes are moist.     Pharynx: Oropharynx is clear.  Eyes:     Conjunctiva/sclera: Conjunctivae normal.     Pupils: Pupils are equal, round, and reactive to light.  Neck:     Musculoskeletal: Normal range of motion and neck supple.     Thyroid: No thyromegaly.     Vascular: No carotid bruit or JVD.  Cardiovascular:     Rate and Rhythm: Normal rate and regular rhythm.     Pulses: Normal pulses.     Heart sounds: Normal heart sounds. No gallop.   Pulmonary:     Effort: Pulmonary effort is normal. No respiratory distress.     Breath sounds: Normal breath sounds. No wheezing or rales.  Abdominal:     General: Bowel sounds are normal. There is no distension or abdominal bruit.     Palpations: Abdomen is soft. There is no mass.     Tenderness: There is no abdominal tenderness.  Musculoskeletal:     Right lower leg: No edema.     Left lower leg: No edema.     Comments: L leg- slight tenderness posterior to knee (no mass or swelling noted) Mild tenderness of calf  Neg Homan's sign No skin change or varicosities  No swelling (calf measurement 32 cm bilat)  No palpable cords or warmth Nl gait    Lymphadenopathy:     Cervical: No cervical adenopathy.  Skin:    General: Skin is warm and dry.     Capillary Refill: Capillary refill takes less than 2 seconds.     Coloration: Skin is not pale.     Findings: No bruising, erythema, lesion or rash.  Neurological:     Mental Status: She is alert.     Motor: No weakness.     Coordination: Coordination normal.     Gait: Gait normal.     Deep Tendon Reflexes: Reflexes are normal and symmetric. Reflexes normal.     Comments: Pt reports very slight dec of sens to lt touch in L foot and ankle  Nl motor fxn and walking  Psychiatric:        Mood and Affect: Mood normal.           Assessment & Plan:   Problem List Items Addressed This  Visit      Other   Malignant neoplasm of upper-inner quadrant of right breast in female, estrogen receptor positive ([place])    On arimidex with oncol f/u planned in May      Relevant Orders   US Venous Img Lower Unilateral Left (Completed)   Pain of left calf - Primary    Non focal exam but pt does have increased risk of blood clots due to arimidex and also cancer status  No redness or swelling and calf circ is symmetric  Differential includes muscle/tendon strain/ baker's cyst or primary knee pain  Will send for LE [place] now to r/o DVT  inst if sob/cp/worse pain -go to ED asap (she voiced understanding) Elevate leg  Ice heat intermittent  Addendum:   Venous doppler neg for DVT inst per mychart to elevate /ice/heat and update [place] in the next week      Relevant Orders   US Venous Img Lower Unilateral Left (Completed)   Use of anastrozole (Arimidex)    Puts her at greater risk of blood clot Sent for venous  US LLE to look for DVT      Relevant Orders   US Venous Img Lower Unilateral Left (Completed)

## 2019-03-21 NOTE — Telephone Encounter (Signed)
I spoke with pt and she rescheduled for in office appt today at 12:15.

## 2019-03-21 NOTE — Telephone Encounter (Signed)
-----   Message from Abner Greenspan, MD sent at 03/21/2019 11:15 AM EDT ----- She is scheduled for 12:00 doxy visit- given issue I think I need to see her in the office instead  If she is unable or unwilling to come in -then I will try to address virtually  We can change the time if needed as well -feel free to open any blocks Thanks! !!

## 2019-03-21 NOTE — Assessment & Plan Note (Signed)
On arimidex with oncol f/u planned in May

## 2019-03-21 NOTE — Patient Instructions (Signed)
We will call you to get an ultrasound of your leg  If pain worsens or you develop shortness of breath or other symptoms then to to the emergency room   We will be in touch

## 2019-03-21 NOTE — Assessment & Plan Note (Signed)
Non focal exam but pt does have increased risk of blood clots due to arimidex and also cancer status  No redness or swelling and calf circ is symmetric  Differential includes muscle/tendon strain/ baker's cyst or primary knee pain  Will send for LE [place] now to r/o DVT  inst if sob/cp/worse pain -go to ED asap (she voiced understanding) Elevate leg  Ice heat intermittent  Addendum:   Venous doppler neg for DVT inst per mychart to elevate /ice/heat and update [place] in the next week

## 2019-04-04 ENCOUNTER — Telehealth: Payer: Self-pay | Admitting: Adult Health

## 2019-04-04 NOTE — Telephone Encounter (Signed)
Spoke with pt re 04/05/19 appt and she agreed to convert office visit into [employer] mtg. Emailed step-by-step instructions how to download and access [employer]. She will call if she has any questions.

## 2019-04-05 ENCOUNTER — Inpatient Hospital Stay: Payer: BLUE CROSS/BLUE SHIELD | Attending: Adult Health | Admitting: Adult Health

## 2019-04-05 ENCOUNTER — Encounter: Payer: Self-pay | Admitting: Adult Health

## 2019-04-05 DIAGNOSIS — Z923 Personal history of irradiation: Secondary | ICD-10-CM

## 2019-04-05 DIAGNOSIS — Z170 Estrogen receptor positive status [ER+]: Secondary | ICD-10-CM

## 2019-04-05 DIAGNOSIS — Z79811 Long term (current) use of aromatase inhibitors: Secondary | ICD-10-CM

## 2019-04-05 DIAGNOSIS — C50211 Malignant neoplasm of upper-inner quadrant of right female breast: Secondary | ICD-10-CM | POA: Diagnosis not present

## 2019-04-05 DIAGNOSIS — R922 Inconclusive mammogram: Secondary | ICD-10-CM | POA: Diagnosis not present

## 2019-04-05 DIAGNOSIS — Z79899 Other long term (current) drug therapy: Secondary | ICD-10-CM

## 2019-04-05 DIAGNOSIS — E039 Hypothyroidism, unspecified: Secondary | ICD-10-CM

## 2019-04-05 DIAGNOSIS — Z803 Family history of malignant neoplasm of breast: Secondary | ICD-10-CM

## 2019-04-05 NOTE — Progress Notes (Signed)
SURVIVORSHIP VIRTUAL VISIT:  I connected with Jennifer [name] on 04/05/19 at  1:00 PM EDT by [facility] and verified that I am speaking with the correct person using two identifiers.  I discussed the limitations, risks, security and privacy concerns of performing an evaluation and management service by telephone and the availability of in person appointments. I also discussed with the patient that there may be a patient responsible charge related to this service. The patient expressed understanding and agreed to proceed.   BRIEF ONCOLOGIC HISTORY:    Malignant neoplasm of upper-inner quadrant of right breast in female, estrogen receptor positive ([place])   09/13/2018 Initial Diagnosis    Diagnostic mammogram detected right breast mass with distortion LIQ at 2:00 9 cm from nipple 1.7 cm, at 1:00 there was a benign 1.2 cm fibrocystic change, biopsy of the 2:00 mass revealed grade 1 IDC with DCIS ER 100%, PR 90%, Ki-67 10%, HER-2 1+ negative, T1CN0 stage Ia clinical stage    10/10/2018 Surgery    Right lumpectomy: IDC grade 1, 1.2 cm, with intermediate grade DCIS, margins negative, 0/2 lymph nodes negative, T1CN0 stage Ia ER 100%, PR 90%, Ki-67 10%, HER-2 1+ negative     10/10/2018 Oncotype testing    oncotype results of 29: Risk of recurrence without chemo 18%    11/07/2018 - 12/08/2018 Radiation Therapy    1. Right Breast / 42.56 Gy in 16 fractions 2. Right Breast Boost / 8 Gy in 4 fractions - Total dose 50.56 Gy    12/2018 -  Anti-estrogen oral therapy    Anastrozole daily, planned for 7 years     INTERVAL HISTORY:  Jennifer [name] to review her survivorship care plan detailing her treatment course for breast cancer, as well as monitoring long-term side effects of that treatment, education regarding health maintenance, screening, and overall wellness and health promotion.     Overall, Jennifer [name] reports feeling quite well.  She is taking Anastrozole daily. She does have some arthralgias and has difficulty  with discerning between whether it is the Anastrozole or her arthritis.  She is tolerating the arthralgias.  She notes she isn't exercising as much as she should be.  She has some hot flashes.  She is managing these with layering her clothes.  She notes that these are not interfering with her quality of life.  She does have some vaginal dryness.  She says she notices it when she is in the shower and wants to know if she should worry about this in the future.    REVIEW OF SYSTEMS:  Review of Systems  Constitutional: Positive for fatigue and unexpected weight change (difficulty with losing weight, is working on diet, and plans to start exercising). Negative for appetite change, chills and fever.  HENT:   Negative for hearing loss, lump/mass, mouth sores and trouble swallowing.   Eyes: Negative for eye problems and icterus.  Respiratory: Negative for chest tightness and cough.   Cardiovascular: Negative for chest pain, leg swelling and palpitations.  Gastrointestinal: Positive for constipation (manages with diet and improved with magnesium oxide). Negative for abdominal distention, abdominal pain, diarrhea, nausea and vomiting.  Endocrine: Positive for hot flashes (as per interval history).  Musculoskeletal: Positive for arthralgias (as per interval history).  Skin: Negative for itching and rash.  Neurological: Negative for dizziness, extremity weakness, headaches and numbness.  Hematological: Negative for adenopathy. Does not bruise/bleed easily.  Psychiatric/Behavioral: Negative for depression. The patient is not nervous/anxious.   Breast: Denies any new nodularity, masses,  tenderness, nipple changes, or nipple discharge.    ONCOLOGY TREATMENT TEAM:  1. Surgeon:  Dr. Marlou Starks at [facility] 2. Medical Oncologist: Dr. Lindi Adie  3. Radiation Oncologist: Dr. Lisbeth Renshaw    PAST MEDICAL/SURGICAL HISTORY:  Past Medical History:  Diagnosis Date  . Allergic rhinitis   . Anxiety   . Arthritis     hands, knees  . Cancer ([place]) 09/2018   Breast CA new DX, right breast  . Cervical dysplasia   . Endometriosis   . Esophageal reflux   . Family history of breast cancer   . Hypothyroid   . Migraine with aura, without mention of intractable migraine without mention of status migrainosus   . Osteopenia 11/2016   T score -2.2 FRAX 9.9%/1.6%  . Pre-diabetes    Past Surgical History:  Procedure Laterality Date  . BREAST EXCISIONAL BIOPSY Bilateral over 10 years ago   benign x 2   . BREAST LUMPECTOMY WITH RADIOACTIVE SEED AND SENTINEL LYMPH NODE BIOPSY Right 10/10/2018   Procedure: RIGHT BREAST LUMPECTOMY WITH RADIOACTIVE SEED AND RIGHT SENTINEL LYMPH NODE BIOPSY;  Surgeon: Jovita Kussmaul, MD;  Location: [facility];  Service: General;  Laterality: Right;  . BREAST SURGERY     Benign breast lump excised  . CERVICAL CONE BIOPSY  1985   severe dysplasia  . COLONOSCOPY  2005   normal  . COLPOSCOPY    . ROTATOR CUFF REPAIR  08/2008     ALLERGIES:  Allergies  Allergen Reactions  . Ciprofloxacin Other (See Comments)    Dizziness   . Codeine Phosphate Other (See Comments)    Dizziness, heart palpitations, nervous  . Decongestant [Pseudoephedrine] Other (See Comments)    dizziness  . Flonase [Fluticasone Propionate] Other (See Comments)    Worsens her migraine   . Hydrocodone Other (See Comments)    Made nervous--10/09 surgery rotator cuff  . Reglan [Metoclopramide] Other (See Comments)    Dizziness  . Topamax [Topiramate] Other (See Comments)    Dizziness   . Benadryl [Diphenhydramine Hcl] Palpitations  . Sulfa Antibiotics Anxiety     CURRENT MEDICATIONS:  Outpatient Encounter Medications as of 04/05/2019  Medication Sig  . ALPRAZolam (XANAX) 0.5 MG tablet TAKE 1/2 TO 1 TABLET BY MOUTH ONCE DAILY AS NEEDED FOR SLEEP/ANXIETY  . anastrozole (ARIMIDEX) 1 MG tablet Take 1 tablet (1 mg total) by mouth daily.  Marland Kitchen atenolol (TENORMIN) 25 MG tablet Take 12.5 mg by  mouth 3 (three) times daily. For migraine prevention  . baclofen (LIORESAL) 10 MG tablet Take 10 mg by mouth daily as needed (migraines).   . Calcium Carbonate-Vitamin D 600-400 MG-UNIT tablet Take 2 tablets by mouth daily.   . cyanocobalamin 2000 MCG tablet Take 2,000 mcg by mouth daily.  . famotidine (PEPCID) 40 MG tablet Take 40 mg by mouth 2 (two) times daily.   Marland Kitchen glucose blood (TRUE METRIX BLOOD GLUCOSE TEST) test strip Use to check blood sugar two times a day.  . hydrOXYzine (ATARAX/VISTARIL) 25 MG tablet Take 1 tablet (25 mg total) by mouth 3 (three) times daily as needed. (Patient taking differently: Take 25 mg by mouth 2 (two) times daily. )  . ketorolac (TORADOL) 10 MG tablet Take 1 tablet (10 mg total) by mouth every 6 (six) hours as needed. (Patient taking differently: Take 10 mg by mouth every 6 (six) hours as needed for moderate pain. )  . Lancets (ACCU-CHEK MULTICLIX) lancets Use to check blood sugar two times a day.  Marland Kitchen  levocetirizine (XYZAL) 5 MG tablet Take 1 tablet by mouth every evening.  Marland Kitchen levothyroxine (SYNTHROID, LEVOTHROID) 75 MCG tablet TAKE ONE TABLET BY MOUTH ONE TIME DAILY   . Misc Natural Products (GLUCOSAMINE CHOND COMPLEX/MSM) TABS Take 1 tablet by mouth daily.   . Multiple Vitamin (MULTIVITAMIN WITH MINERALS) TABS tablet Take 1 tablet by mouth daily.  . nabumetone (RELAFEN) 500 MG tablet Take 500 mg by mouth daily as needed (migraines).   . Omega-3 Fatty Acids (FISH OIL) 1000 MG CAPS Take 1,000 mg by mouth daily.   . OnabotulinumtoxinA (BOTOX IJ) Inject 1 Dose as directed every 6 (six) weeks.   . ondansetron (ZOFRAN ODT) 4 MG disintegrating tablet Take 1 tablet (4 mg total) by mouth every 8 (eight) hours as needed.  . pantoprazole (PROTONIX) 40 MG tablet TAKE ONE TABLET BY MOUTH ONE TIME DAILY  . predniSONE (DELTASONE) 20 MG tablet Take 4 pills by mouth today, then 3 pills on Saturday, 2 pills on Sunday and one pill on Monday then stop  . Probiotic Product  (PROBIOTIC-10 PO) Take 1 capsule by mouth daily.   . promethazine (PHENERGAN) 25 MG tablet Take 12.5 mg by mouth daily as needed for nausea or vomiting. When having a migraine  . rizatriptan (MAXALT) 10 MG tablet Take 1 tablet (10 mg total) by mouth once as needed for up to 10 days for migraine. May repeat in 2 hours if needed  . TURMERIC PO Take 1 tablet by mouth daily.    No facility-administered encounter medications on file as of 04/05/2019.      ONCOLOGIC FAMILY HISTORY:  Family History  Problem Relation Age of Onset  . Breast cancer Mother 39  . Diabetes Mother   . Hypertension Father   . Heart disease Father   . Osteoporosis Maternal Grandmother   . Diabetes Maternal Grandmother   . Hypertension Sister   . COPD Paternal Grandmother   . Heart disease Paternal Grandfather   . Breast cancer Cousin 67       pat first cousin  . Breast cancer Cousin        mat second cousin with breast cancer in her 35s     GENETIC COUNSELING/TESTING: Not at this time  SOCIAL HISTORY:  Social History   Socioeconomic History  . Marital status: Widowed    Spouse name: Not on file  . Number of children: Not on file  . Years of education: Not on file  . Highest education level: Not on file  Occupational History  . 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 Financial resource strain: Not on file  . Food insecurity:    Worry: Not on file    Inability: Not on file  . Transportation needs:    Medical: Not on file    Non-medical: Not on file  Tobacco Use  . Smoking status: Never Smoker  . Smokeless tobacco: Never Used  Substance and Sexual Activity  . Alcohol use: No    Alcohol/week: 0.0 standard drinks  . Drug use: No  . Sexual activity: Not Currently    Birth control/protection: Post-menopausal    Comment: 1st intercourse 64 yo-5 partners  Lifestyle  . Physical activity:    Days per week: Not on file    Minutes per session: Not on file  . Stress: Not on file  Relationships  .  Social connections:    Talks on phone: Not on file    Gets together: Not on file    Attends religious  service: Not on file    Active member of club or organization: Not on file    Attends meetings of clubs or organizations: Not on file    Relationship status: Not on file  . Intimate partner violence:    Fear of current or ex partner: Not on file    Emotionally abused: Not on file    Physically abused: Not on file    Forced sexual activity: Not on file  Other Topics Concern  . 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     OBSERVATIONS/OBJECTIVE:  Patient speech is normal. Non pressured.  No apparent distress.  Breathing non labored, Mood and behavior were normal.  LABORATORY DATA:  None for this visit.  DIAGNOSTIC IMAGING:  None for this visit.     ASSESSMENT AND PLAN:  Jennifer [name] is a pleasant 63 y.o. female with Stage IA right breast invasive ductal carcinoma, ER+/PR+/HER2-, diagnosed in 08/2018, treated with lumpectomy, adjuvant radiation therapy, and anti-estrogen therapy with Letrozole beginning in 12/2018.  She presents to the Survivorship Clinic for our initial meeting and routine follow-up post-completion of treatment for breast cancer.    1. Stage IA right breast cancer:  Jennifer [name] is continuing to recover from definitive treatment for breast cancer. She will follow-up with her medical oncologist, Dr. Lindi Adie in 6 months with history and physical exam per surveillance protocol.  She will continue her anti-estrogen therapy with Letrozole. Thus far, she is tolerating the Letrozole well, with minimal side effects (see #2 and 3). She was instructed to make Dr. Lindi Adie or myself aware if she begins to experience any worsening side effects of the medication and I could see her back in clinic to help manage those side effects, as needed. Her mammogram is due 08/2019; orders placed today.  Her breast  density is category D. Today, a comprehensive survivorship care plan and treatment summary was reviewed with the patient today detailing her breast cancer diagnosis, treatment course, potential late/long-term effects of treatment, appropriate follow-up care with recommendations for the future, and patient education resources.  A copy of this summary, along with a letter will be sent to the patient's primary care provider via mail/fax/In Basket message after today's visit.    2. Arthralgias: Recommended she increase her physical activity with walking and/or Yoga.  Patient notes she will plan to do this.  These arthralgias are manageable for her at this point.  We also talked about acupuncture for help with arthralgias and hot flashes.    3. Vaginal dryness: Reviewed potential complications of vaginal dryness including vaginal atrophy.  We discussed the use of vaginal moisturizers, lubricants and vaginal dilation in detail.   I let Jennifer [name] know that intercourse should she decide to partake in it down the road may be difficult if dryness is not addressed now, however her choice on how to manage it is a personal and private one.  Jennifer [name] was very appreciative of the information and will let me know down the road if she needs any other resources for this topic.    4. Bone health:  Given Jennifer [name] age/history of breast cancer and her current treatment regimen including anti-estrogen therapy with Letrozole, she is at risk for bone demineralization.  Her last DEXA scan was in 11/2016 and demonstrated osteopenia with a T score of -2.2 in the left femur.  She has a repeat bone density ordered with  Dr. Phineas Real.  In the meantime, she was encouraged to increase her consumption of foods rich in calcium, as well as increase her weight-bearing activities.  She was given education on specific activities to promote bone health.  5. Cancer screening:  Due to Jennifer [name]'s history and her age, she should receive screening for skin  cancers, colon cancer, and gynecologic cancers.  The information and recommendations are listed on the patient's comprehensive care plan/treatment summary and were reviewed in detail with the patient.    6. Health maintenance and wellness promotion: Jennifer [name] was encouraged to consume 5-7 servings of fruits and vegetables per day. We reviewed the "Nutrition Rainbow" handout.  She was also encouraged to engage in moderate to vigorous exercise for 30 minutes per day most days of the week. We discussed the [employer] fitness program, which is designed for cancer survivors to help them become more physically fit after cancer treatments.  She was instructed to limit her alcohol consumption and continue to abstain from tobacco use.     7. Support services/counseling: It is not uncommon for this period of the patient's cancer care trajectory to be one of many emotions and stressors.  We discussed how this can be increasingly difficult during the times of quarantine and social distancing due to the COVID-19 pandemic.   She was given information regarding our available services and encouraged to contact me with any questions or for help enrolling in any of our support group/programs.    Follow up instructions:    -Return to cancer center 6 months for follow up with Dr. Lindi Adie -Mammogram due in 08/2019 -Breast MRI for breast density 02/2020 -Follow up with surgery per Dr. Marlou Starks -She is welcome to return back to the Survivorship Clinic at any time; no additional follow-up needed at this time.  -Consider referral back to survivorship as a long-term survivor for continued surveillance  The patient was provided an opportunity to ask questions and all were answered. The patient agreed with the plan and demonstrated an understanding of the instructions.   The patient was advised to call back or seek an in-person evaluation if the symptoms worsen or if the condition fails to improve as anticipated.   I provided  60 minutes of face-to-face video visit time during this encounter, and > 50% was spent counseling as documented under my assessment & plan.  Scot Dock, NP

## 2019-04-06 ENCOUNTER — Telehealth: Payer: Self-pay | Admitting: Hematology and Oncology

## 2019-04-06 NOTE — Telephone Encounter (Signed)
Tried to reach regarding schedule °

## 2019-04-07 ENCOUNTER — Encounter: Payer: BLUE CROSS/BLUE SHIELD | Admitting: Adult Health

## 2019-04-18 DIAGNOSIS — G43019 Migraine without aura, intractable, without status migrainosus: Secondary | ICD-10-CM | POA: Diagnosis not present

## 2019-04-18 DIAGNOSIS — G43719 Chronic migraine without aura, intractable, without status migrainosus: Secondary | ICD-10-CM | POA: Diagnosis not present

## 2019-04-18 DIAGNOSIS — M542 Cervicalgia: Secondary | ICD-10-CM | POA: Diagnosis not present

## 2019-05-09 DIAGNOSIS — J301 Allergic rhinitis due to pollen: Secondary | ICD-10-CM | POA: Diagnosis not present

## 2019-05-09 DIAGNOSIS — L298 Other pruritus: Secondary | ICD-10-CM | POA: Diagnosis not present

## 2019-05-09 DIAGNOSIS — R21 Rash and other nonspecific skin eruption: Secondary | ICD-10-CM | POA: Diagnosis not present

## 2019-05-19 ENCOUNTER — Other Ambulatory Visit: Payer: Self-pay

## 2019-05-24 ENCOUNTER — Telehealth: Payer: Self-pay | Admitting: Family Medicine

## 2019-05-24 NOTE — Telephone Encounter (Signed)
Please schedule virtual visit if needed

## 2019-05-24 NOTE — Telephone Encounter (Signed)
Patient said her runny nose has gone away, but she doesn't feel 100 percent. Patient said she feels a little congested and a little chest tightness.Patient has drainage. I sent call to [facility] since patient is having some chest tightness.

## 2019-05-24 NOTE — Telephone Encounter (Signed)
Best number [phone] Pt called wanting to get covid testing  Sinus issues  Allergies mild  (nose running)

## 2019-05-24 NOTE — Telephone Encounter (Signed)
ATC to get more information regarding symptoms, mailbox full.

## 2019-05-25 NOTE — Telephone Encounter (Signed)
Patient returned call and scheduled appointment with Dr.Tower for a virtual visit on 05/29/19.

## 2019-05-25 NOTE — Telephone Encounter (Signed)
I tried to contact pt to schedule virtual but got v/m and mailbox is full.

## 2019-05-25 NOTE — Telephone Encounter (Signed)
[facility] [facility] - Client TELEPHONE ADVICE RECORD [facility] Patient Name: Jennifer [name] Gender: Female DOB: 05-16-1956 Age: 63 Y 1 M 22 D Return Phone Number: [phone] (Primary) Address: [address] City/State/Zip: [place] [address] Client [place] [facility] - Client Client Site [place] Physician Tower, Roque Lias - MD Contact Type Call Who Is Calling Patient / Member / Family / Caregiver Call Type Triage / Clinical Relationship To Patient Self Return Phone Number [phone] (Primary) Chief Complaint CHEST PAIN (>=21 years) - pain, pressure, heaviness or tightness Reason for Call Symptomatic / Request for Health Information Initial Comment Wanted [phone] testing, has drainage, and a little chest tightness. Translation No Nurse Assessment Nurse: Laurann Montana, RN, Fransico Meadow Date/Time Eilene Ghazi Time): 05/24/2019 5:11:52 PM Confirm and document reason for call. If symptomatic, describe symptoms. ---Caller states that she has chest tightness that doesn't mess with her breathing. Patient states that she feels like she has a cold in her chest. Patient states that she was having nasal drainage, but used nasal spray. Patient now has throat drainage. No fever. No symptoms. Has the patient had close contact with a person known or suspected to have the novel coronavirus illness OR traveled / lives in area with major community spread (including international travel) in the last 14 days from the onset of symptoms? * If Asymptomatic, screen for exposure and travel within the last 14 days. ---No Does the patient have any new or worsening symptoms? ---Yes Will a triage be completed? ---Yes Related visit to physician within the last 2 weeks? ---No Does the PT have any chronic conditions? (i.e. diabetes, asthma, this includes High risk factors for pregnancy, etc.) ---Yes List chronic conditions. ---DM,  Breast cancer Is this a behavioral health or substance abuse call? ---No Guidelines Guideline Title Affirmed Question Affirmed Notes Nurse Date/Time Eilene Ghazi Time) Common Cold Cold with no complications Laurann Montana, RN, Fransico Meadow 05/24/2019 5:14:47 PM PLEASE NOTE: All timestamps contained within this report are represented as [place] Standard Time. CONFIDENTIALTY NOTICE: This fax transmission is intended only for the addressee. It contains information that is legally privileged, confidential or otherwise protected from use or disclosure. If you are not the intended recipient, you are strictly prohibited from reviewing, disclosing, copying using or disseminating any of this information or taking any action in reliance on or regarding this information. If you have received this fax in error, please notify us immediately by telephone so that we can arrange for its return to [place]. Phone: [phone], Toll-Free: [phone], Fax: [phone] Page: 2 of 2 Call Id: [id number] Nora. Time Eilene Ghazi Time) Disposition Final User 05/24/2019 5:10:42 PM Send to Urgent Queue Lawernce Keas 01[birth year] Osborn, RN, [place] Disagree/Comply Comply Caller Understands Yes PreDisposition Call Doctor Care Advice Given Per Guideline HOME CARE: * You should be able to treat this at home. REASSURANCE AND EDUCATION: * It sounds like an uncomplicated cold that we can treat at home. FOR A STUFFY NOSE - USE NASAL WASHES: * Introduction: Saline (salt water) nasal irrigation (nasal wash) is an effective and simple home remedy for treating stuffy nose and sinus congestion. The nose can be irrigated by pouring, spraying, or squirting salt water into the nose and then letting it run back out. FOR A RUNNY NOSE - BLOW YOUR NOSE: * Nasal mucus and discharge help wash viruses and bacteria out of the nose and sinuses. * Blowing your nose helps clean  out your nose. Use a handkerchief or a  paper tissue. TREATMENT FOR OTHER COLD SYMPTOMS: * For muscle aches, headaches, or moderate fever (more than 101 F or 38.3 C): Take acetaminophen every 4 hours. * Sore throat: Try throat lozenges, hard candy, or warm chicken broth. * Cough: Use cough drops. * Hydrate: Drink adequate liquids. CALL BACK IF: * Fever lasts over 3 days * Runny nose lasts over 10 days * You become short of breath * You become worse. CARE ADVICE given per Colds (Adult) guideline. Comments User: Fransico Meadow, Laurann Montana, RN Date/Time Eilene Ghazi Time): 05/24/2019 5:19:27 PM Patient states she was wondering about covid 19 testing. Nurse told patient that would have to be discussed with MD. Patient states she will call MD tomorrow.

## 2019-05-27 ENCOUNTER — Other Ambulatory Visit: Payer: Self-pay | Admitting: Family Medicine

## 2019-05-29 ENCOUNTER — Encounter: Payer: Self-pay | Admitting: Family Medicine

## 2019-05-29 ENCOUNTER — Ambulatory Visit (INDEPENDENT_AMBULATORY_CARE_PROVIDER_SITE_OTHER): Payer: BC Managed Care – PPO | Admitting: Family Medicine

## 2019-05-29 DIAGNOSIS — J3089 Other allergic rhinitis: Secondary | ICD-10-CM | POA: Diagnosis not present

## 2019-05-29 NOTE — Progress Notes (Signed)
Virtual Visit via Video Note  I connected with Raul Del on 05/29/19 at 10:00 AM EDT by a video enabled telemedicine application and verified that I am speaking with the correct person using two identifiers.  Location: Patient: home Provider: office   I discussed the limitations of evaluation and management by telemedicine and the availability of in person appointments. The patient expressed understanding and agreed to proceed.  History of Present Illness: Had symptoms last week -uri A week ago had a runny nose   Took hydroxyzine for allergies and it helped  Back to normal now   No ST Runny nose is gone No congestion or ear symptoms  No cough  No sob  No wheezing  No loss of taste or smell  No n/v/d or stomach pain  No fever or chills or aches   No known exposure to covid at all    Review of Systems  Constitutional: Negative for chills, diaphoresis, fever, malaise/fatigue and weight loss.  HENT: Negative for congestion, ear pain, sinus pain and sore throat.   Eyes: Negative for discharge and redness.  Respiratory: Negative for cough, shortness of breath, wheezing and stridor.   Cardiovascular: Negative for chest pain and palpitations.  Gastrointestinal: Negative for abdominal pain, diarrhea, nausea and vomiting.  Musculoskeletal: Negative for myalgias.  Skin: Negative for itching and rash.  Neurological: Positive for headaches.       Migraines on and off  Not today    Patient Active Problem List   Diagnosis Date Noted  . Pain of left calf 03/21/2019  . Use of anastrozole (Arimidex) 03/21/2019  . Elevated transaminase level 01/03/2019  . Family history of breast cancer   . Stress reaction 09/26/2018  . Malignant neoplasm of upper-inner quadrant of right breast in female, estrogen receptor positive ([place]) 09/20/2018  . B12 deficiency 04/13/2018  . Joint pain 04/11/2018  . Skin pain 04/11/2018  . Paresthesia 04/11/2018  . Colon cancer screening 09/16/2017  .  Prediabetes 07/28/2016  . Allergic rhinitis 11/13/2014  . Screening for lipoid disorders 09/26/2014  . Preventative health care 09/26/2014  . Routine general medical examination at a health care facility 10/28/2012  . Herpes labialis 04/28/2011  . GERD 09/16/2010  . Hypothyroidism 05/07/2008  . Osteopenia 05/07/2008  . Migraine with aura 11/11/2007   Past Medical History:  Diagnosis Date  . Allergic rhinitis   . Anxiety   . Arthritis    hands, knees  . Cancer ([place]) 09/2018   Breast CA new DX, right breast  . Cervical dysplasia   . Endometriosis   . Esophageal reflux   . Family history of breast cancer   . Hypothyroid   . Migraine with aura, without mention of intractable migraine without mention of status migrainosus   . Osteopenia 11/2016   T score -2.2 FRAX 9.9%/1.6%  . Pre-diabetes    Past Surgical History:  Procedure Laterality Date  . BREAST EXCISIONAL BIOPSY Bilateral over 10 years ago   benign x 2   . BREAST LUMPECTOMY WITH RADIOACTIVE SEED AND SENTINEL LYMPH NODE BIOPSY Right 10/10/2018   Procedure: RIGHT BREAST LUMPECTOMY WITH RADIOACTIVE SEED AND RIGHT SENTINEL LYMPH NODE BIOPSY;  Surgeon: Jovita Kussmaul, MD;  Location: [facility];  Service: General;  Laterality: Right;  . BREAST SURGERY     Benign breast lump excised  . CERVICAL CONE BIOPSY  1985   severe dysplasia  . COLONOSCOPY  2005   normal  . COLPOSCOPY    . ROTATOR CUFF  REPAIR  08/2008   Social History   Tobacco Use  . Smoking status: Never Smoker  . Smokeless tobacco: Never Used  Substance Use Topics  . Alcohol use: No    Alcohol/week: 0.0 standard drinks  . Drug use: No   Family History  Problem Relation Age of Onset  . Breast cancer Mother 26  . Diabetes Mother   . Hypertension Father   . Heart disease Father   . Osteoporosis Maternal Grandmother   . Diabetes Maternal Grandmother   . Hypertension Sister   . COPD Paternal Grandmother   . Heart disease Paternal  Grandfather   . Breast cancer Cousin 5       pat first cousin  . Breast cancer Cousin        mat second cousin with breast cancer in her 57s   Allergies  Allergen Reactions  . Ciprofloxacin Other (See Comments)    Dizziness   . Codeine Phosphate Other (See Comments)    Dizziness, heart palpitations, nervous  . Decongestant [Pseudoephedrine] Other (See Comments)    dizziness  . Flonase [Fluticasone Propionate] Other (See Comments)    Worsens her migraine   . Hydrocodone Other (See Comments)    Made nervous--10/09 surgery rotator cuff  . Reglan [Metoclopramide] Other (See Comments)    Dizziness  . Topamax [Topiramate] Other (See Comments)    Dizziness   . Benadryl [Diphenhydramine Hcl] Palpitations  . Sulfa Antibiotics Anxiety   Current Outpatient Medications on File Prior to Visit  Medication Sig Dispense Refill  . ALPRAZolam (XANAX) 0.5 MG tablet TAKE 1/2 TO 1 TABLET BY MOUTH ONCE DAILY AS NEEDED FOR SLEEP/ANXIETY 15 tablet 0  . anastrozole (ARIMIDEX) 1 MG tablet Take 1 tablet (1 mg total) by mouth daily. 90 tablet 3  . atenolol (TENORMIN) 25 MG tablet Take 12.5 mg by mouth 3 (three) times daily. For migraine prevention    . baclofen (LIORESAL) 10 MG tablet Take 10 mg by mouth daily as needed (migraines).     . Calcium Carbonate-Vitamin D 600-400 MG-UNIT tablet Take 2 tablets by mouth daily.     . cyanocobalamin 2000 MCG tablet Take 2,000 mcg by mouth daily.    . famotidine (PEPCID) 40 MG tablet Take 40 mg by mouth 2 (two) times daily.     Marland Kitchen glucose blood (TRUE METRIX BLOOD GLUCOSE TEST) test strip Use to check blood sugar two times a day. 100 each 6  . hydrOXYzine (ATARAX/VISTARIL) 25 MG tablet Take 1 tablet (25 mg total) by mouth 3 (three) times daily as needed. (Patient taking differently: Take 25 mg by mouth 2 (two) times daily. ) 30 tablet 0  . ketorolac (TORADOL) 10 MG tablet Take 1 tablet (10 mg total) by mouth every 6 (six) hours as needed. (Patient taking differently:  Take 10 mg by mouth every 6 (six) hours as needed for moderate pain. ) 20 tablet 0  . Lancets (ACCU-CHEK MULTICLIX) lancets Use to check blood sugar two times a day. 100 each 6  . levocetirizine (XYZAL) 5 MG tablet Take 1 tablet by mouth every evening.  3  . levothyroxine (SYNTHROID, LEVOTHROID) 75 MCG tablet TAKE ONE TABLET BY MOUTH ONE TIME DAILY  30 tablet 5  . Misc Natural Products (GLUCOSAMINE CHOND COMPLEX/MSM) TABS Take 1 tablet by mouth daily.     . Multiple Vitamin (MULTIVITAMIN WITH MINERALS) TABS tablet Take 1 tablet by mouth daily.    . nabumetone (RELAFEN) 500 MG tablet Take 500 mg by mouth daily  as needed (migraines).     . Omega-3 Fatty Acids (FISH OIL) 1000 MG CAPS Take 1,000 mg by mouth daily.     . OnabotulinumtoxinA (BOTOX IJ) Inject 1 Dose as directed every 6 (six) weeks.     . ondansetron (ZOFRAN ODT) 4 MG disintegrating tablet Take 1 tablet (4 mg total) by mouth every 8 (eight) hours as needed. 15 tablet 0  . pantoprazole (PROTONIX) 40 MG tablet TAKE ONE TABLET BY MOUTH ONE TIME DAILY 30 tablet 5  . Probiotic Product (PROBIOTIC-10 PO) Take 1 capsule by mouth daily.     . promethazine (PHENERGAN) 25 MG tablet Take 12.5 mg by mouth daily as needed for nausea or vomiting. When having a migraine    . TURMERIC PO Take 1 tablet by mouth daily.     . rizatriptan (MAXALT) 10 MG tablet Take 1 tablet (10 mg total) by mouth once as needed for up to 10 days for migraine. May repeat in 2 hours if needed 10 tablet 0   No current facility-administered medications on file prior to visit.     Observations/Objective: Patient appears well, in no distress Weight is baseline  No facial swelling or asymmetry Normal voice-not hoarse and no slurred speech No obvious tremor or mobility impairment Moving neck and UEs normally Able to hear the call well  No cough or shortness of breath during interview  Talkative and mentally sharp with no cognitive changes No skin changes on face or neck , no  rash or pallor Affect is normal    Assessment and Plan: Problem List Items Addressed This Visit      Respiratory   Allergic rhinitis - Primary    Worse lately with runny nose  Pt was concerned about a uri last week but symptoms totally resolved with her hydroxyzine and allergen avoidance  She will continue f/u with her allergist as well   Enc pt to watch for s/s of covid incl congestion/st/ear pain/fever/chills/malaise/GI symptoms or loss of taste and smell           Follow Up Instructions: Continue hydroxyzine for allergies and avoid allergens if possible   Watch for signs/symptoms of covid infection including congestion Jeoffrey Massed throat/ear pain /fever or chills/malaise/loss of taste or smell/ GI symptoms  Keep [place] posted    I discussed the assessment and treatment plan with the patient. The patient was provided an opportunity to ask questions and all were answered. The patient agreed with the plan and demonstrated an understanding of the instructions.   The patient was advised to call back or seek an in-person evaluation if the symptoms worsen or if the condition fails to improve as anticipated.    Loura Pardon, MD

## 2019-05-29 NOTE — Patient Instructions (Signed)
Continue hydroxyzine for allergies and avoid allergens if possible   Watch for signs/symptoms of covid infection including congestion Jeoffrey Massed throat/ear pain /fever or chills/malaise/loss of taste or smell/ GI symptoms  Keep [place] posted

## 2019-05-29 NOTE — Assessment & Plan Note (Signed)
Worse lately with runny nose  Pt was concerned about a uri last week but symptoms totally resolved with her hydroxyzine and allergen avoidance  She will continue f/u with her allergist as well   Enc pt to watch for s/s of covid incl congestion/st/ear pain/fever/chills/malaise/GI symptoms or loss of taste and smell

## 2019-05-31 DIAGNOSIS — G43719 Chronic migraine without aura, intractable, without status migrainosus: Secondary | ICD-10-CM | POA: Diagnosis not present

## 2019-06-07 DIAGNOSIS — G43019 Migraine without aura, intractable, without status migrainosus: Secondary | ICD-10-CM | POA: Diagnosis not present

## 2019-06-07 DIAGNOSIS — G43719 Chronic migraine without aura, intractable, without status migrainosus: Secondary | ICD-10-CM | POA: Diagnosis not present

## 2019-06-07 DIAGNOSIS — M542 Cervicalgia: Secondary | ICD-10-CM | POA: Diagnosis not present

## 2019-06-13 DIAGNOSIS — G43719 Chronic migraine without aura, intractable, without status migrainosus: Secondary | ICD-10-CM | POA: Diagnosis not present

## 2019-06-13 DIAGNOSIS — G43019 Migraine without aura, intractable, without status migrainosus: Secondary | ICD-10-CM | POA: Diagnosis not present

## 2019-06-14 DIAGNOSIS — Z20828 Contact with and (suspected) exposure to other viral communicable diseases: Secondary | ICD-10-CM | POA: Diagnosis not present

## 2019-06-28 DIAGNOSIS — G43719 Chronic migraine without aura, intractable, without status migrainosus: Secondary | ICD-10-CM | POA: Diagnosis not present

## 2019-06-28 DIAGNOSIS — M542 Cervicalgia: Secondary | ICD-10-CM | POA: Diagnosis not present

## 2019-06-28 DIAGNOSIS — G43019 Migraine without aura, intractable, without status migrainosus: Secondary | ICD-10-CM | POA: Diagnosis not present

## 2019-07-18 ENCOUNTER — Other Ambulatory Visit: Payer: Self-pay | Admitting: Family Medicine

## 2019-07-18 MED ORDER — ALPRAZOLAM 0.5 MG PO TABS: ORAL_TABLET | ORAL | 0 refills | Status: DC

## 2019-07-18 NOTE — Telephone Encounter (Signed)
Best number [phone] Pt called needing to get a refill on  Alprazolam  [facility]  Pt has 2 pill left

## 2019-07-18 NOTE — Telephone Encounter (Signed)
Last filled on 03/17/19 #15 tabs with 0 refills, last OV was a doxy on 05/29/19 for URI sxs

## 2019-07-26 ENCOUNTER — Other Ambulatory Visit: Payer: Self-pay | Admitting: Family Medicine

## 2019-08-22 ENCOUNTER — Encounter: Payer: Self-pay | Admitting: Gynecology

## 2019-08-23 ENCOUNTER — Ambulatory Visit (INDEPENDENT_AMBULATORY_CARE_PROVIDER_SITE_OTHER): Payer: BC Managed Care – PPO | Admitting: Psychology

## 2019-08-23 ENCOUNTER — Other Ambulatory Visit: Payer: Self-pay

## 2019-08-23 DIAGNOSIS — F411 Generalized anxiety disorder: Secondary | ICD-10-CM | POA: Diagnosis not present

## 2019-08-23 DIAGNOSIS — G43719 Chronic migraine without aura, intractable, without status migrainosus: Secondary | ICD-10-CM | POA: Diagnosis not present

## 2019-08-24 ENCOUNTER — Ambulatory Visit (INDEPENDENT_AMBULATORY_CARE_PROVIDER_SITE_OTHER): Payer: BC Managed Care – PPO

## 2019-08-24 DIAGNOSIS — M8589 Other specified disorders of bone density and structure, multiple sites: Secondary | ICD-10-CM | POA: Diagnosis not present

## 2019-08-24 DIAGNOSIS — Z780 Asymptomatic menopausal state: Secondary | ICD-10-CM

## 2019-08-24 DIAGNOSIS — Z13820 Encounter for screening for osteoporosis: Secondary | ICD-10-CM

## 2019-08-24 DIAGNOSIS — M8580 Other specified disorders of bone density and structure, unspecified site: Secondary | ICD-10-CM

## 2019-08-25 ENCOUNTER — Other Ambulatory Visit: Payer: Self-pay | Admitting: Gynecology

## 2019-08-25 ENCOUNTER — Encounter: Payer: Self-pay | Admitting: Gynecology

## 2019-08-25 DIAGNOSIS — Z780 Asymptomatic menopausal state: Secondary | ICD-10-CM

## 2019-08-25 DIAGNOSIS — M8589 Other specified disorders of bone density and structure, multiple sites: Secondary | ICD-10-CM

## 2019-08-28 ENCOUNTER — Other Ambulatory Visit: Payer: Self-pay

## 2019-08-28 DIAGNOSIS — Z20822 Contact with and (suspected) exposure to covid-19: Secondary | ICD-10-CM

## 2019-08-28 DIAGNOSIS — Z20828 Contact with and (suspected) exposure to other viral communicable diseases: Secondary | ICD-10-CM | POA: Diagnosis not present

## 2019-08-30 LAB — NOVEL CORONAVIRUS, NAA: SARS-CoV-2, NAA: NOT DETECTED

## 2019-09-01 ENCOUNTER — Other Ambulatory Visit: Payer: Self-pay

## 2019-09-01 ENCOUNTER — Encounter: Payer: Self-pay | Admitting: Family Medicine

## 2019-09-01 ENCOUNTER — Ambulatory Visit (INDEPENDENT_AMBULATORY_CARE_PROVIDER_SITE_OTHER): Payer: BC Managed Care – PPO | Admitting: Family Medicine

## 2019-09-01 DIAGNOSIS — J069 Acute upper respiratory infection, unspecified: Secondary | ICD-10-CM | POA: Diagnosis not present

## 2019-09-01 NOTE — Assessment & Plan Note (Signed)
Pt has very mild symptoms of intermittent sore throat and chest thickness (no cough) Was exp to covid (with masks) on Thursday and neg test Monday Still in incubation period Pt thinks she will go ahead and get tested today at [facility]  Pending result Disc s/s to watch for and update if worse or new symptoms  She will isolate until result or symptoms are gone

## 2019-09-01 NOTE — Progress Notes (Signed)
Virtual Visit via Video Note  I connected with Jennifer [name] on 09/01/19 at  8:00 AM EDT by a video enabled telemedicine application and verified that I am speaking with the correct person using two identifiers.  Location: Patient: home Provider: office    I discussed the limitations of evaluation and management by telemedicine and the availability of in person appointments. The patient expressed understanding and agreed to proceed.  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 Viral URI 09/01/2019  . Pain of left calf 03/21/2019  . Use of anastrozole (Arimidex) 03/21/2019  . Elevated transaminase level 01/03/2019  . Family history of breast cancer   . Stress reaction 09/26/2018  . Malignant neoplasm of upper-inner quadrant of right breast in female, estrogen receptor positive ([place]) 09/20/2018  . B12 deficiency 04/13/2018  . Joint pain 04/11/2018  . Skin pain 04/11/2018  . Paresthesia 04/11/2018  . Colon cancer screening 09/16/2017  . Prediabetes 07/28/2016  . Allergic rhinitis 11/13/2014  . Screening for lipoid disorders 09/26/2014  . Preventative health care 09/26/2014  . Routine general medical examination at a health care facility 10/28/2012  . Herpes labialis 04/28/2011  . GERD 09/16/2010  .  Hypothyroidism 05/07/2008  . Osteopenia 05/07/2008  . Migraine with aura 11/11/2007   Past Medical History:  Diagnosis Date  . Allergic rhinitis   . Anxiety   . Arthritis    hands, knees  . Cancer ([name]) 09/2018   Breast CA new DX, right breast  . Cervical dysplasia   . Endometriosis   . Esophageal reflux   . Family history of breast cancer   . Hypothyroid   . Migraine with aura, without mention of intractable migraine without mention of status migrainosus   . Osteopenia 08/2019   T score -2.2 FRAX 11% / 1.7%  . Pre-diabetes    Past Surgical History:  Procedure Laterality Date  . BREAST EXCISIONAL BIOPSY Bilateral over 10 years ago   benign x 2   . BREAST LUMPECTOMY WITH RADIOACTIVE SEED AND SENTINEL LYMPH NODE BIOPSY Right 10/10/2018   Procedure: RIGHT BREAST LUMPECTOMY WITH RADIOACTIVE SEED AND RIGHT SENTINEL LYMPH NODE BIOPSY;  Surgeon: Jovita Kussmaul, MD;  Location: [facility];  Service: General;  Laterality: Right;  . BREAST SURGERY     Benign breast lump excised  . CERVICAL CONE BIOPSY  1985   severe dysplasia  . COLONOSCOPY  2005   normal  . COLPOSCOPY    . ROTATOR CUFF REPAIR  08/2008   Social History   Tobacco Use  . Smoking status: Never Smoker  . Smokeless tobacco: Never Used  Substance Use Topics  . Alcohol use: No  Alcohol/week: 0.0 standard drinks  . Drug use: No   Family History  Problem Relation Age of Onset  . Breast cancer Mother 61  . Diabetes Mother   . Hypertension Father   . Heart disease Father   . Osteoporosis Maternal Grandmother   . Diabetes Maternal Grandmother   . Hypertension Sister   . COPD Paternal Grandmother   . Heart disease Paternal Grandfather   . Breast cancer Cousin 62       pat first cousin  . Breast cancer Cousin        mat second cousin with breast cancer in her 32s   Allergies  Allergen Reactions  . Ciprofloxacin Other (See Comments)    Dizziness   . Codeine Phosphate Other (See Comments)     Dizziness, heart palpitations, nervous  . Decongestant [Pseudoephedrine] Other (See Comments)    dizziness  . Flonase [Fluticasone Propionate] Other (See Comments)    Worsens her migraine   . Hydrocodone Other (See Comments)    Made nervous--10/09 surgery rotator cuff  . Reglan [Metoclopramide] Other (See Comments)    Dizziness  . Topamax [Topiramate] Other (See Comments)    Dizziness   . Benadryl [Diphenhydramine Hcl] Palpitations  . Sulfa Antibiotics Anxiety   Current Outpatient Medications on File Prior to Visit  Medication Sig Dispense Refill  . ALPRAZolam (XANAX) 0.5 MG tablet TAKE 1/2 TO 1 TABLET BY MOUTH ONCE DAILY AS NEEDED FOR SLEEP/ANXIETY 15 tablet 0  . anastrozole (ARIMIDEX) 1 MG tablet Take 1 tablet (1 mg total) by mouth daily. 90 tablet 3  . atenolol (TENORMIN) 25 MG tablet Take 12.5 mg by mouth 3 (three) times daily. For migraine prevention    . baclofen (LIORESAL) 10 MG tablet Take 10 mg by mouth daily as needed (migraines).     . Calcium Carbonate-Vitamin D 600-400 MG-UNIT tablet Take 2 tablets by mouth daily.     . cyanocobalamin 2000 MCG tablet Take 2,000 mcg by mouth daily.    . famotidine (PEPCID) 40 MG tablet Take 40 mg by mouth 2 (two) times daily.     Marland Kitchen glucose blood (TRUE METRIX BLOOD GLUCOSE TEST) test strip Use to check blood sugar two times a day. 100 each 6  . hydrOXYzine (ATARAX/VISTARIL) 25 MG tablet Take 1 tablet (25 mg total) by mouth 3 (three) times daily as needed. (Patient taking differently: Take 25 mg by mouth 2 (two) times daily. ) 30 tablet 0  . ketorolac (TORADOL) 10 MG tablet Take 1 tablet (10 mg total) by mouth every 6 (six) hours as needed. (Patient taking differently: Take 10 mg by mouth every 6 (six) hours as needed for moderate pain. ) 20 tablet 0  . Lancets (ACCU-CHEK MULTICLIX) lancets Use to check blood sugar two times a day. 100 each 6  . levocetirizine (XYZAL) 5 MG tablet Take 1 tablet by mouth every evening.  3  . levothyroxine  (SYNTHROID) 75 MCG tablet TAKE ONE TABLET BY MOUTH ONE TIME DAILY  30 tablet 5  . Misc Natural Products (GLUCOSAMINE CHOND COMPLEX/MSM) TABS Take 1 tablet by mouth daily.     . Multiple Vitamin (MULTIVITAMIN WITH MINERALS) TABS tablet Take 1 tablet by mouth daily.    . nabumetone (RELAFEN) 500 MG tablet Take 500 mg by mouth daily as needed (migraines).     . Omega-3 Fatty Acids (FISH OIL) 1000 MG CAPS Take 1,000 mg by mouth daily.     . OnabotulinumtoxinA (BOTOX IJ) Inject 1 Dose as directed every  6 (six) weeks.     . ondansetron (ZOFRAN ODT) 4 MG disintegrating tablet Take 1 tablet (4 mg total) by mouth every 8 (eight) hours as needed. 15 tablet 0  . pantoprazole (PROTONIX) 40 MG tablet TAKE ONE TABLET BY MOUTH ONE TIME DAILY  30 tablet 5  . Probiotic Product (PROBIOTIC-10 PO) Take 1 capsule by mouth daily.     . promethazine (PHENERGAN) 25 MG tablet Take 12.5 mg by mouth daily as needed for nausea or vomiting. When having a migraine    . TURMERIC PO Take 1 tablet by mouth daily.     . rizatriptan (MAXALT) 10 MG tablet Take 1 tablet (10 mg total) by mouth once as needed for up to 10 days for migraine. May repeat in 2 hours if needed 10 tablet 0   No current facility-administered medications on file prior to visit.      Review of Systems  Constitutional: Negative for chills, fever and malaise/fatigue.  HENT: Positive for sore throat. Negative for congestion, ear pain and sinus pain.   Eyes: Negative for discharge and redness.  Respiratory: Negative for cough, sputum production, shortness of breath and wheezing.        No cough but chest feels like it is on the edge of becoming congested  Cardiovascular: Negative for chest pain and palpitations.  Gastrointestinal: Negative for abdominal pain, diarrhea, nausea and vomiting.  Musculoskeletal: Positive for joint pain.       Baseline joint pain  Skin: Negative for itching and rash.  Neurological: Negative for dizziness and headaches.     Observations/Objective: Patient appears well, in no distress Weight is baseline  No facial swelling or asymmetry Normal voice-not hoarse and no slurred speech No obvious tremor or mobility impairment Moving neck and UEs normally Able to hear the call well  No cough or shortness of breath during interview  Talkative and mentally sharp with no cognitive changes No skin changes on face or neck , no rash or pallor Affect is normal    Assessment and Plan: Problem List Items Addressed This Visit      Respiratory   Viral URI    Pt has very mild symptoms of intermittent sore throat and chest thickness (no cough) Was exp to covid (with masks) on Thursday and neg test Monday Still in incubation period Pt thinks she will go ahead and get tested today at [facility]  Pending result Disc s/s to watch for and update if worse or new symptoms  She will isolate until result or symptoms are gone            Follow Up Instructions: Drink fluids and try to get enough rest  Go ahead and get tested again at the [facility] center today  We will watch for results Isolate until then Update if not starting to improve in a week or if worsening   Especially if any new covid-like symptoms (cough/fever/change in taste or smell)   I discussed the assessment and treatment plan with the patient. The patient was provided an opportunity to ask questions and all were answered. The patient agreed with the plan and demonstrated an understanding of the instructions.   The patient was advised to call back or seek an in-person evaluation if the symptoms worsen or if the condition fails to improve as anticipated.     Loura Pardon, MD

## 2019-09-01 NOTE — Patient Instructions (Signed)
Drink fluids and try to get enough rest  Go ahead and get tested again at the [facility] center today  We will watch for results Isolate until then Update if not starting to improve in a week or if worsening   Especially if any new covid-like symptoms (cough/fever/change in taste or smell)

## 2019-09-05 ENCOUNTER — Telehealth: Payer: Self-pay | Admitting: Genetic Counselor

## 2019-09-05 ENCOUNTER — Ambulatory Visit (INDEPENDENT_AMBULATORY_CARE_PROVIDER_SITE_OTHER): Payer: BC Managed Care – PPO | Admitting: Psychology

## 2019-09-05 DIAGNOSIS — F411 Generalized anxiety disorder: Secondary | ICD-10-CM

## 2019-09-05 DIAGNOSIS — M76821 Posterior tibial tendinitis, right leg: Secondary | ICD-10-CM | POA: Diagnosis not present

## 2019-09-05 NOTE — Telephone Encounter (Signed)
Called patient regarding upcoming Webex appointment, left a voicemail and e-mail has been sent.  °

## 2019-09-06 ENCOUNTER — Telehealth: Payer: Self-pay | Admitting: Genetic Counselor

## 2019-09-06 ENCOUNTER — Inpatient Hospital Stay: Payer: BC Managed Care – PPO | Admitting: Genetic Counselor

## 2019-09-06 DIAGNOSIS — G43719 Chronic migraine without aura, intractable, without status migrainosus: Secondary | ICD-10-CM | POA: Diagnosis not present

## 2019-09-06 DIAGNOSIS — G43019 Migraine without aura, intractable, without status migrainosus: Secondary | ICD-10-CM | POA: Diagnosis not present

## 2019-09-06 DIAGNOSIS — M542 Cervicalgia: Secondary | ICD-10-CM | POA: Diagnosis not present

## 2019-09-06 NOTE — Telephone Encounter (Signed)
Returned patient's phone call regarding cancelling 10/14 appointment, left a voicemail for patient to reschedule.

## 2019-09-08 ENCOUNTER — Ambulatory Visit
Admission: RE | Admit: 2019-09-08 | Discharge: 2019-09-08 | Disposition: A | Discharge status: Home / Self Care | Payer: BLUE CROSS/BLUE SHIELD | Source: Ambulatory Visit | Attending: Adult Health | Admitting: Adult Health

## 2019-09-08 ENCOUNTER — Other Ambulatory Visit: Payer: Self-pay

## 2019-09-08 DIAGNOSIS — Z170 Estrogen receptor positive status [ER+]: Secondary | ICD-10-CM

## 2019-09-08 DIAGNOSIS — R921 Mammographic calcification found on diagnostic imaging of breast: Secondary | ICD-10-CM | POA: Diagnosis not present

## 2019-09-08 DIAGNOSIS — C50211 Malignant neoplasm of upper-inner quadrant of right female breast: Secondary | ICD-10-CM

## 2019-09-08 DIAGNOSIS — R922 Inconclusive mammogram: Secondary | ICD-10-CM | POA: Diagnosis not present

## 2019-09-11 ENCOUNTER — Other Ambulatory Visit: Payer: Self-pay

## 2019-09-11 DIAGNOSIS — Z20822 Contact with and (suspected) exposure to covid-19: Secondary | ICD-10-CM

## 2019-09-11 DIAGNOSIS — Z20828 Contact with and (suspected) exposure to other viral communicable diseases: Secondary | ICD-10-CM | POA: Diagnosis not present

## 2019-09-13 ENCOUNTER — Telehealth: Payer: Self-pay | Admitting: Genetic Counselor

## 2019-09-13 LAB — NOVEL CORONAVIRUS, NAA: SARS-CoV-2, NAA: NOT DETECTED

## 2019-09-13 NOTE — Telephone Encounter (Signed)
Patient called to reschedule missed October Genetic Counseling appointment, per patient's request appointment has moved to 11/04. This will be a webex visit, e-mail has been sent.

## 2019-09-18 ENCOUNTER — Emergency Department: Payer: BC Managed Care – PPO

## 2019-09-18 ENCOUNTER — Other Ambulatory Visit: Payer: Self-pay

## 2019-09-18 ENCOUNTER — Emergency Department
Admission: EM | Admit: 2019-09-18 | Discharge: 2019-09-18 | Disposition: A | Discharge status: Home / Self Care | Payer: BC Managed Care – PPO | Attending: Emergency Medicine | Admitting: Emergency Medicine

## 2019-09-18 DIAGNOSIS — Z853 Personal history of malignant neoplasm of breast: Secondary | ICD-10-CM | POA: Diagnosis not present

## 2019-09-18 DIAGNOSIS — N390 Urinary tract infection, site not specified: Secondary | ICD-10-CM | POA: Diagnosis not present

## 2019-09-18 DIAGNOSIS — E039 Hypothyroidism, unspecified: Secondary | ICD-10-CM | POA: Diagnosis not present

## 2019-09-18 DIAGNOSIS — M76821 Posterior tibial tendinitis, right leg: Secondary | ICD-10-CM | POA: Diagnosis not present

## 2019-09-18 DIAGNOSIS — R300 Dysuria: Secondary | ICD-10-CM | POA: Diagnosis not present

## 2019-09-18 DIAGNOSIS — R319 Hematuria, unspecified: Secondary | ICD-10-CM | POA: Diagnosis not present

## 2019-09-18 DIAGNOSIS — Z79899 Other long term (current) drug therapy: Secondary | ICD-10-CM | POA: Diagnosis not present

## 2019-09-18 LAB — URINALYSIS, COMPLETE (UACMP) WITH MICROSCOPIC
Bacteria, UA: NONE SEEN
Specific Gravity, Urine: 1.004 — ABNORMAL LOW (ref 1.005–1.030)
Squamous Epithelial / HPF: NONE SEEN (ref 0–5)
WBC, UA: >50 WBC/hpf — ABNORMAL HIGH (ref 0–5)

## 2019-09-18 MED ORDER — CEPHALEXIN 500 MG PO CAPS: 500.0000 mg | ORAL_CAPSULE | Freq: Four times a day (QID) | ORAL | 0 refills | Status: DC

## 2019-09-18 MED ORDER — CEFTRIAXONE SODIUM 1 G IJ SOLR
1.0000 g | Freq: Once | INTRAMUSCULAR | Status: AC
  Administered 2019-09-18: 1 g via INTRAMUSCULAR
  Filled 2019-09-18: qty 10

## 2019-09-18 NOTE — ED Triage Notes (Addendum)
Pt comes via POV from home with c/o UTI. Pt states burning when urinating and some blood in urine.  Pt states some urgency as well.   Pt denies any blood thinners. Pt states hx of UTI.

## 2019-09-18 NOTE — ED Provider Notes (Signed)
[facility] Emergency Department Provider Note  ____________________________________________  Time seen: Approximately 9:31 PM  I have reviewed the triage vital signs and the nursing notes.   HISTORY  Chief Complaint Urinary Tract Infection    HPI Jennifer [name] is a 63 y.o. female who presents the emergency department complaining of hematuria and dysuria starting last night.  Patient initially started to have some mild dysuria, and noticed a twinge of blood.  Patient does have a history of UTIs and believes that she was starting another UTI.  Patient was concerned as she has had more more blood to the point that her urine appears red at this time.  No history of nephrolithiasis.  Patient states that she does typically have a little bit of blood with UTI but never this much.  Patient denies any fevers or chills, abdominal pain.  No flank pain.  Patient has medical history as described below with no complaints with chronic medical problems.         Past Medical History:  Diagnosis Date  . Allergic rhinitis   . Anxiety   . Arthritis    hands, knees  . Breast cancer ([place]) 2019   Right Breast Cancer  . Cancer ([place]) 09/2018   Breast CA new DX, right breast  . Cervical dysplasia   . Endometriosis   . Esophageal reflux   . Family history of breast cancer   . Hypothyroid   . Migraine with aura, without mention of intractable migraine without mention of status migrainosus   . Osteopenia 08/2019   T score -2.2 FRAX 11% / 1.7%  . Personal history of radiation therapy 2019   Right Breast Cancer  . Pre-diabetes     Patient Active Problem List   Diagnosis Date Noted  . Viral URI 09/01/2019  . Pain of left calf 03/21/2019  . Use of anastrozole (Arimidex) 03/21/2019  . Elevated transaminase level 01/03/2019  . Family history of breast cancer   . Stress reaction 09/26/2018  . Malignant neoplasm of upper-inner quadrant of right breast in female, estrogen  receptor positive ([place]) 09/20/2018  . B12 deficiency 04/13/2018  . Joint pain 04/11/2018  . Skin pain 04/11/2018  . Paresthesia 04/11/2018  . Colon cancer screening 09/16/2017  . Prediabetes 07/28/2016  . Allergic rhinitis 11/13/2014  . Screening for lipoid disorders 09/26/2014  . Preventative health care 09/26/2014  . Routine general medical examination at a health care facility 10/28/2012  . Herpes labialis 04/28/2011  . GERD 09/16/2010  . Hypothyroidism 05/07/2008  . Osteopenia 05/07/2008  . Migraine with aura 11/11/2007    Past Surgical History:  Procedure Laterality Date  . BREAST EXCISIONAL BIOPSY Bilateral over 10 years ago   benign x 2   . BREAST LUMPECTOMY Right 10/10/2018  . BREAST LUMPECTOMY WITH RADIOACTIVE SEED AND SENTINEL LYMPH NODE BIOPSY Right 10/10/2018   Procedure: RIGHT BREAST LUMPECTOMY WITH RADIOACTIVE SEED AND RIGHT SENTINEL LYMPH NODE BIOPSY;  Surgeon: Jovita Kussmaul, MD;  Location: [employer];  Service: General;  Laterality: Right;  . BREAST SURGERY     Benign breast lump excised  . CERVICAL CONE BIOPSY  1985   severe dysplasia  . COLONOSCOPY  2005   normal  . COLPOSCOPY    . ROTATOR CUFF REPAIR  08/2008    Prior to Admission medications   Medication Sig Start Date End Date Taking? Authorizing Provider  ALPRAZolam (XANAX) 0.5 MG tablet TAKE 1/2 TO 1 TABLET BY MOUTH ONCE DAILY AS NEEDED  FOR SLEEP/ANXIETY 07/18/19   Tower, [name], MD  anastrozole (ARIMIDEX) 1 MG tablet Take 1 tablet (1 mg total) by mouth daily. 12/28/18   Nicholas Lose, MD  atenolol (TENORMIN) 25 MG tablet Take 12.5 mg by mouth 3 (three) times daily. For migraine prevention    [provider]  baclofen (LIORESAL) 10 MG tablet Take 10 mg by mouth daily as needed (migraines).     [provider]  Calcium Carbonate-Vitamin D 600-400 MG-UNIT tablet Take 2 tablets by mouth daily.     [provider]  cephALEXin (KEFLEX) 500 MG capsule Take 1 capsule  (500 mg total) by mouth 4 (four) times daily. 09/18/19   Mileidy Atkin, Charline Bills, PA-C  cyanocobalamin 2000 MCG tablet Take 2,000 mcg by mouth daily.    [provider]  famotidine (PEPCID) 40 MG tablet Take 40 mg by mouth 2 (two) times daily.     [provider]  glucose blood (TRUE METRIX BLOOD GLUCOSE TEST) test strip Use to check blood sugar two times a day. 03/15/19   Tower, [name], MD  hydrOXYzine (ATARAX/VISTARIL) 25 MG tablet Take 1 tablet (25 mg total) by mouth 3 (three) times daily as needed. Patient taking differently: Take 25 mg by mouth 2 (two) times daily.  10/18/17   Triplett, Johnette Abraham B, FNP  ketorolac (TORADOL) 10 MG tablet Take 1 tablet (10 mg total) by mouth every 6 (six) hours as needed. Patient taking differently: Take 10 mg by mouth every 6 (six) hours as needed for moderate pain.  06/07/17   Gregor Hams, MD  Lancets (ACCU-CHEK MULTICLIX) lancets Use to check blood sugar two times a day. 03/20/19   Tower, [name], MD  levocetirizine (XYZAL) 5 MG tablet Take 1 tablet by mouth every evening. 10/28/17   [provider]  levothyroxine (SYNTHROID) 75 MCG tablet TAKE ONE TABLET BY MOUTH ONE TIME DAILY  07/26/19   Tower, [name], MD  Misc Natural Products (GLUCOSAMINE CHOND COMPLEX/MSM) TABS Take 1 tablet by mouth daily.     [provider]  Multiple Vitamin (MULTIVITAMIN WITH MINERALS) TABS tablet Take 1 tablet by mouth daily.    [provider]  nabumetone (RELAFEN) 500 MG tablet Take 500 mg by mouth daily as needed (migraines).     [provider]  Omega-3 Fatty Acids (FISH OIL) 1000 MG CAPS Take 1,000 mg by mouth daily.     [provider]  OnabotulinumtoxinA (BOTOX IJ) Inject 1 Dose as directed every 6 (six) weeks.     [provider]  ondansetron (ZOFRAN ODT) 4 MG disintegrating tablet Take 1 tablet (4 mg total) by mouth every 8 (eight) hours as needed. 11/03/18   Menshew, Dannielle Karvonen, PA-C  pantoprazole  (PROTONIX) 40 MG tablet TAKE ONE TABLET BY MOUTH ONE TIME DAILY  05/29/19   Tower, [name], MD  Probiotic Product (PROBIOTIC-10 PO) Take 1 capsule by mouth daily.     [provider]  promethazine (PHENERGAN) 25 MG tablet Take 12.5 mg by mouth daily as needed for nausea or vomiting. When having a migraine    [provider]  rizatriptan (MAXALT) 10 MG tablet Take 1 tablet (10 mg total) by mouth once as needed for up to 10 days for migraine. May repeat in 2 hours if needed 11/03/18 11/13/18  Menshew, Dannielle Karvonen, PA-C  TURMERIC PO Take 1 tablet by mouth daily.     [provider]    Allergies Ciprofloxacin, Codeine phosphate,  Decongestant [pseudoephedrine], Flonase [fluticasone propionate], Hydrocodone, Reglan [metoclopramide], Topamax [topiramate], Benadryl [diphenhydramine hcl], and Sulfa antibiotics  Family History  Problem Relation Age of Onset  . Breast cancer Mother 53  . Diabetes Mother   . Hypertension Father   . Heart disease Father   . Osteoporosis Maternal Grandmother   . Diabetes Maternal Grandmother   . Hypertension Sister   . COPD Paternal Grandmother   . Heart disease Paternal Grandfather   . Breast cancer Cousin 10       pat first cousin  . Breast cancer Cousin        mat second cousin with breast cancer in her 33s    Social History Social History   Tobacco Use  . Smoking status: Never Smoker  . Smokeless tobacco: Never Used  Substance Use Topics  . Alcohol use: No    Alcohol/week: 0.0 standard drinks  . Drug use: No     Review of Systems  Constitutional: No fever/chills Eyes: No visual changes. No discharge ENT: No upper respiratory complaints. Cardiovascular: no chest pain. Respiratory: no cough. No SOB. Gastrointestinal: No abdominal pain.  No nausea, no vomiting.  No diarrhea.  No constipation. Genitourinary: Positive for dysuria, polyuria, hematuria.  Positive for urinary urgency.  No flank pain. Musculoskeletal: Negative  for musculoskeletal pain. Skin: Negative for rash, abrasions, lacerations, ecchymosis. Neurological: Negative for headaches, focal weakness or numbness. 10-point ROS otherwise negative.  ____________________________________________   PHYSICAL EXAM:  VITAL SIGNS: ED Triage Vitals [09/18/19 2020]  Enc Vitals Group     BP 126/61     Pulse Rate 66     Resp 16     Temp 99.1 F (37.3 C)     Temp Source Oral     SpO2 99 %     Weight 125 lb (56.7 kg)     Height 5\' 1"  (1.549 m)     Head Circumference      Peak Flow      Pain Score      Pain Loc      Pain Edu?      Excl. in [place]?      Constitutional: Alert and oriented. Well appearing and in no acute distress. Eyes: Conjunctivae are normal. PERRL. EOMI. Head: Atraumatic. ENT:      Ears:       Nose: No congestion/rhinnorhea.      Mouth/Throat: Mucous membranes are moist.  Neck: No stridor.    Cardiovascular: Normal rate, regular rhythm. Normal S1 and S2.  Good peripheral circulation. Respiratory: Normal respiratory effort without tachypnea or retractions. Lungs CTAB. Good air entry to the bases with no decreased or absent breath sounds. Gastrointestinal: Bowel sounds 4 quadrants. Soft and nontender to palpation. No guarding or rigidity. No palpable masses. No distention. No CVA tenderness. Musculoskeletal: Full range of motion to all extremities. No gross deformities appreciated. Neurologic:  Normal speech and language. No gross focal neurologic deficits are appreciated.  Skin:  Skin is warm, dry and intact. No rash noted. Psychiatric: Mood and affect are normal. Speech and behavior are normal. Patient exhibits appropriate insight and judgement.   ____________________________________________   LABS (all labs ordered are listed, but only abnormal results are displayed)  Labs Reviewed  URINALYSIS, COMPLETE (UACMP) WITH MICROSCOPIC - Abnormal; Notable for the following components:      Result Value   Color, Urine RED (*)     APPearance HAZY (*)    Specific Gravity, Urine 1.004 (*)    Glucose, UA   (*)  Value: TEST NOT REPORTED DUE TO COLOR INTERFERENCE OF URINE PIGMENT   Hgb urine dipstick   (*)    Value: TEST NOT REPORTED DUE TO COLOR INTERFERENCE OF URINE PIGMENT   Bilirubin Urine   (*)    Value: TEST NOT REPORTED DUE TO COLOR INTERFERENCE OF URINE PIGMENT   Ketones, ur   (*)    Value: TEST NOT REPORTED DUE TO COLOR INTERFERENCE OF URINE PIGMENT   Protein, ur   (*)    Value: TEST NOT REPORTED DUE TO COLOR INTERFERENCE OF URINE PIGMENT   Nitrite   (*)    Value: TEST NOT REPORTED DUE TO COLOR INTERFERENCE OF URINE PIGMENT   Leukocytes,Ua   (*)    Value: TEST NOT REPORTED DUE TO COLOR INTERFERENCE OF URINE PIGMENT   WBC, UA >50 (*)    All other components within normal limits  URINE CULTURE   ____________________________________________  EKG   ____________________________________________  RADIOLOGY I personally viewed and evaluated these images as part of my medical decision making, as well as reviewing the written report by the radiologist.  Ct Renal Stone Study  Result Date: 09/18/2019 CLINICAL DATA:  63 year old female with hematuria. EXAM: CT ABDOMEN AND PELVIS WITHOUT CONTRAST TECHNIQUE: Multidetector CT imaging of the abdomen and pelvis was performed following the standard protocol without IV contrast. COMPARISON:  None. FINDINGS: Evaluation of this exam is limited in the absence of intravenous contrast. Lower chest: Focal area of subpleural scarring in the right middle lobe. The visualized lung bases are otherwise clear. No intra-abdominal free air or free fluid. Hepatobiliary: Small scattered hepatic hypodense lesions measure up to 2 cm in the caudate lobe are not characterized on this noncontrast CT. There is no intrahepatic biliary ductal dilatation. The gallbladder is unremarkable. Pancreas: Unremarkable. No pancreatic ductal dilatation or surrounding inflammatory changes. Spleen: Normal in  size without focal abnormality. Adrenals/Urinary Tract: The adrenal glands are unremarkable. There is no hydronephrosis or nephrolithiasis on either side. The visualized ureters appear unremarkable. There is diffuse thickened and hazy appearance of the bladder wall most concerning for acute cystitis. Correlation with urinalysis recommended. Stomach/Bowel: Moderate stool throughout the colon. There is no bowel obstruction or active inflammation. The appendix is not visualized with certainty. No inflammatory changes identified in the right lower quadrant. Vascular/Lymphatic: The abdominal aorta and IVC are grossly unremarkable on this noncontrast CT. No portal venous gas. There is no adenopathy. Reproductive: The uterus is anteverted. Calcified fibroids noted. There is a 4.5 cm right adnexal cyst. Further evaluation with pelvic ultrasound on a nonemergent basis recommended. Other: None Musculoskeletal: Lower lumbar facet arthropathy. No acute osseous pathology. IMPRESSION: 1. Findings most concerning for acute cystitis. Correlation with urinalysis recommended. No hydronephrosis or nephrolithiasis. 2. No bowel obstruction or active inflammation. 3. A 4.5 cm right adnexal cyst. Further evaluation with pelvic ultrasound on a nonemergent basis recommended. Electronically Signed   By: Anner Crete M.D.   On: 09/18/2019 22:27    ____________________________________________    PROCEDURES  Procedure(s) performed:    Procedures    Medications  cefTRIAXone (ROCEPHIN) injection 1 g (has no administration in time range)     ____________________________________________   INITIAL IMPRESSION / ASSESSMENT AND PLAN / ED COURSE  Pertinent labs & imaging results that were available during my care of the patient were reviewed by me and considered in my medical decision making (see chart for details).  Review of the [place] CSRS was performed in accordance of the [place] prior to dispensing any controlled  drugs.           Patient's diagnosis is consistent with UTI.  Patient presents emergency department complaining of dysuria, polyuria, hematuria.  Patient states that she does have a history of UTIs and has had urinary tract infections with hematuria in the past.  Patient was concerned as she had more hematuria than normal.  Patient had no abdominal pain or flank pain.  Exam was reassuring.  Given the amount of blood urinalysis was inconclusive.  Culture has been sent at this time.  Also given the amount of hematuria, patient was evaluated with CT scan for possible nephrolithiasis.  This returns with reassuring results.  Patient will be treated with Rocephin and Keflex.  Follow-up with primary care..  Patient is given ED precautions to return to the ED for any worsening or new symptoms.     ____________________________________________  FINAL CLINICAL IMPRESSION(S) / ED DIAGNOSES  Final diagnoses:  Lower urinary tract infectious disease      NEW MEDICATIONS STARTED DURING THIS VISIT:  ED Discharge Orders         Ordered    cephALEXin (KEFLEX) 500 MG capsule  4 times daily     09/18/19 2252              This chart was dictated using voice recognition software/Dragon. Despite best efforts to proofread, errors can occur which can change the meaning. Any change was purely unintentional.    Darletta Moll, PA-C 09/18/19 2253    Nena Polio, MD 09/18/19 [phone]

## 2019-09-18 NOTE — ED Notes (Signed)
See triage note. Pt states she has Hx of UTI but the primary concerning factor is the increasing quantity of blood with her urine. She states the blood comes only when she urinates. She has been wearing a pad and no blood noted other than when she urinates. Pt has Hx of breast cancer with lumpectomy in Dec 2019 and radiation therapy in Jan/Feb 2020. Pt is A&O, NAD, no respiratory Sx noted.

## 2019-09-19 ENCOUNTER — Ambulatory Visit: Payer: BC Managed Care – PPO | Admitting: Family Medicine

## 2019-09-20 ENCOUNTER — Ambulatory Visit (INDEPENDENT_AMBULATORY_CARE_PROVIDER_SITE_OTHER): Payer: BC Managed Care – PPO | Admitting: Psychology

## 2019-09-20 DIAGNOSIS — F411 Generalized anxiety disorder: Secondary | ICD-10-CM

## 2019-09-20 LAB — URINE CULTURE: Culture: 50000 — AB

## 2019-09-21 NOTE — Progress Notes (Signed)
Brief Pharmacy Note  63 y/o F presented to [facility] ED on 10/26 c/o hematuria and dysuria. Discharged on cephalexin for UTI. Urine culture resulted 50k colonies/mL E coli (cefazolin sensitive). Patient is appropriately covered on discharge antibiotic. Will not pursue alteration to therapy.   [place] Resident 21 September 2019

## 2019-09-24 ENCOUNTER — Encounter: Payer: Self-pay | Admitting: Family Medicine

## 2019-09-26 ENCOUNTER — Other Ambulatory Visit: Payer: Self-pay

## 2019-09-26 ENCOUNTER — Ambulatory Visit (INDEPENDENT_AMBULATORY_CARE_PROVIDER_SITE_OTHER): Payer: BC Managed Care – PPO | Admitting: Family Medicine

## 2019-09-26 ENCOUNTER — Encounter: Payer: Self-pay | Admitting: Family Medicine

## 2019-09-26 ENCOUNTER — Telehealth: Payer: Self-pay | Admitting: Genetic Counselor

## 2019-09-26 VITALS — BP 116/78 | HR 59 | Temp 97.1°F | Ht 61.0 in | Wt 131.4 lb

## 2019-09-26 DIAGNOSIS — Z20822 Contact with and (suspected) exposure to covid-19: Secondary | ICD-10-CM

## 2019-09-26 DIAGNOSIS — N3091 Cystitis, unspecified with hematuria: Secondary | ICD-10-CM

## 2019-09-26 DIAGNOSIS — N949 Unspecified condition associated with female genital organs and menstrual cycle: Secondary | ICD-10-CM | POA: Insufficient documentation

## 2019-09-26 DIAGNOSIS — Z87440 Personal history of urinary (tract) infections: Secondary | ICD-10-CM | POA: Diagnosis not present

## 2019-09-26 LAB — POC URINALSYSI DIPSTICK (AUTOMATED)
Bilirubin, UA: NEGATIVE
Blood, UA: 10
Glucose, UA: NEGATIVE
Ketones, UA: NEGATIVE
Leukocytes, UA: NEGATIVE
Nitrite, UA: NEGATIVE
Protein, UA: NEGATIVE
Spec Grav, UA: 1.015 (ref 1.010–1.025)
Urobilinogen, UA: 0.2 E.U./dL
pH, UA: 6 (ref 5.0–8.0)

## 2019-09-26 NOTE — Telephone Encounter (Signed)
Called patient regarding upcoming [facility] appointment, patient is notified and e-mail has been sent. °

## 2019-09-26 NOTE — Progress Notes (Signed)
Subjective:    Patient ID: Jennifer [name], female    DOB: 1956-01-27, 63 y.o.   MRN: [id number]  HPI Here for ER f/u  Presented on 10/26 with hematuria and dysuria since the pm before   Temp was 99.1   UA was positive   CT scan was done  Ct Renal Stone Study  Result Date: 09/18/2019 CLINICAL DATA:  63 year old female with hematuria. EXAM: CT ABDOMEN AND PELVIS WITHOUT CONTRAST TECHNIQUE: Multidetector CT imaging of the abdomen and pelvis was performed following the standard protocol without IV contrast. COMPARISON:  None. FINDINGS: Evaluation of this exam is limited in the absence of intravenous contrast. Lower chest: Focal area of subpleural scarring in the right middle lobe. The visualized lung bases are otherwise clear. No intra-abdominal free air or free fluid. Hepatobiliary: Small scattered hepatic hypodense lesions measure up to 2 cm in the caudate lobe are not characterized on this noncontrast CT. There is no intrahepatic biliary ductal dilatation. The gallbladder is unremarkable. Pancreas: Unremarkable. No pancreatic ductal dilatation or surrounding inflammatory changes. Spleen: Normal in size without focal abnormality. Adrenals/Urinary Tract: The adrenal glands are unremarkable. There is no hydronephrosis or nephrolithiasis on either side. The visualized ureters appear unremarkable. There is diffuse thickened and hazy appearance of the bladder wall most concerning for acute cystitis. Correlation with urinalysis recommended. Stomach/Bowel: Moderate stool throughout the colon. There is no bowel obstruction or active inflammation. The appendix is not visualized with certainty. No inflammatory changes identified in the right lower quadrant. Vascular/Lymphatic: The abdominal aorta and IVC are grossly unremarkable on this noncontrast CT. No portal venous gas. There is no adenopathy. Reproductive: The uterus is anteverted. Calcified fibroids noted. There is a 4.5 cm right adnexal cyst. Further  evaluation with pelvic ultrasound on a nonemergent basis recommended. Other: None Musculoskeletal: Lower lumbar facet arthropathy. No acute osseous pathology. IMPRESSION: 1. Findings most concerning for acute cystitis. Correlation with urinalysis recommended. No hydronephrosis or nephrolithiasis. 2. No bowel obstruction or active inflammation. 3. A 4.5 cm right adnexal cyst. Further evaluation with pelvic ultrasound on a nonemergent basis recommended. Electronically Signed   By: Anner Crete M.D.   On: 09/18/2019 22:27   No stones of hydronephrosis seen 4.5 cm R adnexal cyst  Thickened and hazy appearance of bladder wall consistent with uti   She was tx in ED with rocephin and dc with keflex  Culture pos for e coli Component 8d ago  Specimen Description URINE, RANDOM  Performed at [facility], [address] [place], [place] [address]   Special Requests NONE  Performed at [facility], [address] [place], [place] [address]   Culture 50,000 COLONIES/mL [place]    Report Status 09/20/2019 FINAL   Organism ID, Bacteria ESCHERICHIA COLIAbnormal    Resulting Agency [facility]  Susceptibility   Escherichia coli    MIC    AMPICILLIN <=2 SENSITIVE  Sensitive    AMPICILLIN/SULBACTAM <=2 SENSITIVE  Sensitive    CEFAZOLIN <=4 SENSITIVE  Sensitive    CEFTRIAXONE <=1 SENSITIVE  Sensitive    CIPROFLOXACIN <=0.25 SENS... Sensitive    Extended ESBL NEGATIVE  Sensitive    GENTAMICIN <=1 SENSITIVE  Sensitive    IMIPENEM <=0.25 SENS... Sensitive    NITROFURANTOIN <=16 SENSIT... Sensitive    PIP/TAZO <=4 SENSITIVE  Sensitive    TRIMETH/SULFA <=20 SENSIT... Sensitive         Susceptibility Comments  Escherichia coli  50,000 COLONIES/mL ESCHERICHIA COLI     Pan sensitive  Today trace blood  Results for orders placed or performed in visit on 09/26/19  POCT Urinalysis Dipstick (Automated)  Result Value Ref Range   Color, UA Light Yellow     Clarity, UA Clear    Glucose, UA Negative Negative   Bilirubin, UA Negative    Ketones, UA Negative    Spec Grav, UA 1.015 1.010 - 1.025   Blood, UA 10 Ery/uL    pH, UA 6.0 5.0 - 8.0   Protein, UA Negative Negative   Urobilinogen, UA 0.2 0.2 or 1.0 E.U./dL   Nitrite, UA Negative    Leukocytes, UA Negative Negative     Is totally asymptomatic except for some residual fatigue   Had some migraines following that   She has had many covid tests=-her work makes her get tested weekly   No pain in R low abdomen or pelvis She did not have ovarian cyst growing up  Had cervical dysplasia in the past  Sees gyn- Dr Phineas Real   Patient Active Problem List   Diagnosis Date Noted   Adnexal cyst 09/26/2019   Cystitis with hematuria 09/26/2019   Pain of left calf 03/21/2019   Use of anastrozole (Arimidex) 03/21/2019   Elevated transaminase level 01/03/2019   Family history of breast cancer    Stress reaction 09/26/2018   Malignant neoplasm of upper-inner quadrant of right breast in female, estrogen receptor positive ([place]) 09/20/2018   B12 deficiency 04/13/2018   Joint pain 04/11/2018   Skin pain 04/11/2018   Paresthesia 04/11/2018   Colon cancer screening 09/16/2017   Prediabetes 07/28/2016   Allergic rhinitis 11/13/2014   Screening for lipoid disorders 09/26/2014   Preventative health care 09/26/2014   Routine general medical examination at a health care facility 10/28/2012   Herpes labialis 04/28/2011   GERD 09/16/2010   Hypothyroidism 05/07/2008   Osteopenia 05/07/2008   Migraine with aura 11/11/2007   Past Medical History:  Diagnosis Date   Allergic rhinitis    Anxiety    Arthritis    hands, knees   Breast cancer ([place]) 2019   Right Breast Cancer   Cancer ([place]) 09/2018   Breast CA new DX, right breast   Cervical dysplasia    Endometriosis    Esophageal reflux    Family history of breast cancer    Hypothyroid    Migraine with aura,  without mention of intractable migraine without mention of status migrainosus    Osteopenia 08/2019   T score -2.2 FRAX 11% / 1.7%   Personal history of radiation therapy 2019   Right Breast Cancer   Pre-diabetes    Past Surgical History:  Procedure Laterality Date   BREAST EXCISIONAL BIOPSY Bilateral over 10 years ago   benign x 2    BREAST LUMPECTOMY Right 10/10/2018   BREAST LUMPECTOMY WITH RADIOACTIVE SEED AND SENTINEL LYMPH NODE BIOPSY Right 10/10/2018   Procedure: RIGHT BREAST LUMPECTOMY WITH RADIOACTIVE SEED AND RIGHT SENTINEL LYMPH NODE BIOPSY;  Surgeon: Jovita Kussmaul, MD;  Location: [facility];  Service: General;  Laterality: Right;   BREAST SURGERY     Benign breast lump excised   CERVICAL CONE BIOPSY  1985   severe dysplasia   COLONOSCOPY  2005   normal   COLPOSCOPY     ROTATOR CUFF REPAIR  08/2008   Social History   Tobacco Use   Smoking status: Never Smoker   Smokeless tobacco: Never Used  Substance Use Topics   Alcohol use: No    Alcohol/week:  0.0 standard drinks   Drug use: No   Family History  Problem Relation Age of Onset   Breast cancer Mother 23   Diabetes Mother    Hypertension Father    Heart disease Father    Osteoporosis Maternal Grandmother    Diabetes Maternal Grandmother    Hypertension Sister    COPD Paternal Grandmother    Heart disease Paternal Grandfather    Breast cancer Cousin 58       pat first cousin   Breast cancer Cousin        mat second cousin with breast cancer in her 29s   Allergies  Allergen Reactions   Ciprofloxacin Other (See Comments)    Dizziness    Codeine Phosphate Other (See Comments)    Dizziness, heart palpitations, nervous   Decongestant [Pseudoephedrine] Other (See Comments)    dizziness   Flonase [Fluticasone Propionate] Other (See Comments)    Worsens her migraine    Hydrocodone Other (See Comments)    Made nervous--10/09 surgery rotator cuff   Reglan  [Metoclopramide] Other (See Comments)    Dizziness   Topamax [Topiramate] Other (See Comments)    Dizziness    Benadryl [Diphenhydramine Hcl] Palpitations   Sulfa Antibiotics Anxiety   Current Outpatient Medications on File Prior to Visit  Medication Sig Dispense Refill   ALPRAZolam (XANAX) 0.5 MG tablet TAKE 1/2 TO 1 TABLET BY MOUTH ONCE DAILY AS NEEDED FOR SLEEP/ANXIETY 15 tablet 0   anastrozole (ARIMIDEX) 1 MG tablet Take 1 tablet (1 mg total) by mouth daily. 90 tablet 3   atenolol (TENORMIN) 25 MG tablet Take 12.5 mg by mouth 3 (three) times daily. For migraine prevention     baclofen (LIORESAL) 10 MG tablet Take 10 mg by mouth daily as needed (migraines).      Calcium Carbonate-Vitamin D 600-400 MG-UNIT tablet Take 2 tablets by mouth daily.      cyanocobalamin 2000 MCG tablet Take 2,000 mcg by mouth daily.     famotidine (PEPCID) 40 MG tablet Take 40 mg by mouth 2 (two) times daily.      glucose blood (TRUE METRIX BLOOD GLUCOSE TEST) test strip Use to check blood sugar two times a day. 100 each 6   hydrOXYzine (ATARAX/VISTARIL) 25 MG tablet Take 1 tablet (25 mg total) by mouth 3 (three) times daily as needed. (Patient taking differently: Take 25 mg by mouth 2 (two) times daily. ) 30 tablet 0   ketorolac (TORADOL) 10 MG tablet Take 1 tablet (10 mg total) by mouth every 6 (six) hours as needed. (Patient taking differently: Take 10 mg by mouth every 6 (six) hours as needed for moderate pain. ) 20 tablet 0   Lancets (ACCU-CHEK MULTICLIX) lancets Use to check blood sugar two times a day. 100 each 6   levocetirizine (XYZAL) 5 MG tablet Take 1 tablet by mouth every evening.  3   levothyroxine (SYNTHROID) 75 MCG tablet TAKE ONE TABLET BY MOUTH ONE TIME DAILY  30 tablet 5   Misc Natural Products (GLUCOSAMINE CHOND COMPLEX/MSM) TABS Take 1 tablet by mouth daily.      Multiple Vitamin (MULTIVITAMIN WITH MINERALS) TABS tablet Take 1 tablet by mouth daily.     nabumetone (RELAFEN)  500 MG tablet Take 500 mg by mouth daily as needed (migraines).      Omega-3 Fatty Acids (FISH OIL) 1000 MG CAPS Take 1,000 mg by mouth daily.      OnabotulinumtoxinA (BOTOX IJ) Inject 1 Dose as directed every 6 (  six) weeks.      ondansetron (ZOFRAN ODT) 4 MG disintegrating tablet Take 1 tablet (4 mg total) by mouth every 8 (eight) hours as needed. 15 tablet 0   pantoprazole (PROTONIX) 40 MG tablet TAKE ONE TABLET BY MOUTH ONE TIME DAILY  30 tablet 5   Probiotic Product (PROBIOTIC-10 PO) Take 1 capsule by mouth daily.      promethazine (PHENERGAN) 25 MG tablet Take 12.5 mg by mouth daily as needed for nausea or vomiting. When having a migraine     TURMERIC PO Take 1 tablet by mouth daily.      rizatriptan (MAXALT) 10 MG tablet Take 1 tablet (10 mg total) by mouth once as needed for up to 10 days for migraine. May repeat in 2 hours if needed 10 tablet 0   No current facility-administered medications on file prior to visit.      Review of Systems  Constitutional: Negative for activity change, appetite change, fatigue, fever and unexpected weight change.  HENT: Negative for congestion, ear pain, rhinorrhea, sinus pressure and sore throat.   Eyes: Negative for pain, redness and visual disturbance.  Respiratory: Negative for cough, shortness of breath and wheezing.   Cardiovascular: Negative for chest pain and palpitations.  Gastrointestinal: Negative for abdominal pain, blood in stool, constipation and diarrhea.  Endocrine: Negative for polydipsia and polyuria.  Genitourinary: Negative for dysuria, frequency and urgency.       Uti symptoms and hematuria are over  Musculoskeletal: Negative for arthralgias, back pain and myalgias.  Skin: Negative for pallor and rash.  Allergic/Immunologic: Negative for environmental allergies.  Neurological: Negative for dizziness, syncope and headaches.  Hematological: Negative for adenopathy. Does not bruise/bleed easily.  Psychiatric/Behavioral:  Negative for decreased concentration and dysphoric mood. The patient is not nervous/anxious.        Objective:   Physical Exam Constitutional:      General: She is not in acute distress.    Appearance: Normal appearance. She is normal weight. She is not ill-appearing or diaphoretic.  HENT:     Mouth/Throat:     Mouth: Mucous membranes are moist.  Eyes:     General: No scleral icterus.    Extraocular Movements: Extraocular movements intact.     Pupils: Pupils are equal, round, and reactive to light.  Neck:     Musculoskeletal: Normal range of motion and neck supple. No muscular tenderness.  Cardiovascular:     Rate and Rhythm: Regular rhythm. Bradycardia present.     Pulses: Normal pulses.     Heart sounds: Normal heart sounds.  Pulmonary:     Effort: Pulmonary effort is normal. No respiratory distress.     Breath sounds: Normal breath sounds. No wheezing or rales.  Abdominal:     General: Abdomen is flat. Bowel sounds are normal. There is no distension.     Palpations: Abdomen is soft. There is no mass.     Tenderness: There is no abdominal tenderness. There is no right CVA tenderness, left CVA tenderness, guarding or rebound.     Hernia: No hernia is present.     Comments: No suprapubic tenderness or fullness  No pelvic tenderness   Musculoskeletal:     Right lower leg: No edema.     Left lower leg: No edema.  Lymphadenopathy:     Cervical: No cervical adenopathy.  Skin:    General: Skin is warm and dry.     Coloration: Skin is not pale.     Findings: No rash.  Neurological:     Mental Status: She is alert.  Psychiatric:        Mood and Affect: Mood normal.           Assessment & Plan:   Problem List Items Addressed This Visit      Genitourinary   Adnexal cyst    4.5 cm adnexal cyst seen incidentally on her recent CT scan Will send her to gyn for eval  No symptoms from this currently      Relevant Orders   Ambulatory referral to Gynecology   Cystitis  with hematuria - Primary    Clinically resolved after tx with rocephin and keflex Reviewed hospital records, lab results and studies in detail  Mod to severe uti with bladder wall thickening seen on CT and gross hematuria Trace of blood on dip -will re cx this and update        Relevant Orders   Urine Culture    Other Visit Diagnoses    History of UTI       Relevant Orders   POCT Urinalysis Dipstick (Automated) (Completed)

## 2019-09-26 NOTE — Patient Instructions (Addendum)
Our office will call you to set up a gyn visit for the cyst seen in right adnexa   I'm glad your uti symptoms are better  We will re culture your urine  There is still a trace of blood in urine-not a surprise given the extent of your infection   Drink lots of fluids Continue to get rest when you can

## 2019-09-26 NOTE — Assessment & Plan Note (Signed)
4.5 cm adnexal cyst seen incidentally on her recent CT scan Will send her to gyn for eval  No symptoms from this currently

## 2019-09-26 NOTE — Assessment & Plan Note (Addendum)
Clinically resolved after tx with rocephin and keflex Reviewed hospital records, lab results and studies in detail  Mod to severe uti with bladder wall thickening seen on CT and gross hematuria Trace of blood on dip -will re cx this and update

## 2019-09-27 ENCOUNTER — Inpatient Hospital Stay: Payer: BC Managed Care – PPO | Attending: Genetic Counselor | Admitting: Genetic Counselor

## 2019-09-27 ENCOUNTER — Ambulatory Visit (INDEPENDENT_AMBULATORY_CARE_PROVIDER_SITE_OTHER): Payer: BC Managed Care – PPO | Admitting: Gynecology

## 2019-09-27 ENCOUNTER — Encounter: Payer: Self-pay | Admitting: Gynecology

## 2019-09-27 ENCOUNTER — Encounter: Payer: Self-pay | Admitting: Genetic Counselor

## 2019-09-27 VITALS — BP 118/76

## 2019-09-27 DIAGNOSIS — Z923 Personal history of irradiation: Secondary | ICD-10-CM | POA: Insufficient documentation

## 2019-09-27 DIAGNOSIS — Z79811 Long term (current) use of aromatase inhibitors: Secondary | ICD-10-CM | POA: Insufficient documentation

## 2019-09-27 DIAGNOSIS — Z791 Long term (current) use of non-steroidal anti-inflammatories (NSAID): Secondary | ICD-10-CM | POA: Insufficient documentation

## 2019-09-27 DIAGNOSIS — Z803 Family history of malignant neoplasm of breast: Secondary | ICD-10-CM

## 2019-09-27 DIAGNOSIS — Z7984 Long term (current) use of oral hypoglycemic drugs: Secondary | ICD-10-CM | POA: Insufficient documentation

## 2019-09-27 DIAGNOSIS — Z79899 Other long term (current) drug therapy: Secondary | ICD-10-CM | POA: Insufficient documentation

## 2019-09-27 DIAGNOSIS — Z170 Estrogen receptor positive status [ER+]: Secondary | ICD-10-CM | POA: Insufficient documentation

## 2019-09-27 DIAGNOSIS — N83201 Unspecified ovarian cyst, right side: Secondary | ICD-10-CM

## 2019-09-27 DIAGNOSIS — Z8042 Family history of malignant neoplasm of prostate: Secondary | ICD-10-CM

## 2019-09-27 DIAGNOSIS — C50211 Malignant neoplasm of upper-inner quadrant of right female breast: Secondary | ICD-10-CM

## 2019-09-27 DIAGNOSIS — Z1379 Encounter for other screening for genetic and chromosomal anomalies: Secondary | ICD-10-CM

## 2019-09-27 NOTE — Progress Notes (Signed)
    Jennifer [name] [birth year] [id number]        63 y.o.  G0P0 presents having recently undergone a renal stone protocol CT scan due to urinary symptoms which ultimately turned out to be cystitis.  The CT scan showed a 4.5 cm right adnexal cyst.  No other pathology such as adenopathy or ascites was noted.  She was referred for further evaluation.  She is no longer having pain or other symptoms.  No history of recent pelvic studies such as ultrasounds or CT scans in epic.  Past medical history,surgical history, problem list, medications, allergies, family history and social history were all reviewed and documented in the EPIC chart.  Directed ROS with pertinent positives and negatives documented in the history of present illness/assessment and plan.  Exam: Caryn Bee assistant Vitals:   09/27/19 1610  BP: 118/76   General appearance:  Normal Abdomen soft nontender without masses guarding rebound Pelvic external BUS vagina with atrophic changes.  Cervix with atrophic changes.  Uterus grossly normal midline mobile nontender.  Adnexa without masses or tenderness.  Assessment/Plan:  63 y.o. G0P0 with 4.5 cm adnexal cyst on the right on CT scan as an incidental finding.  We reviewed the differential to include ovarian versus paraovarian cysts.  We discussed persistent functional as well as tumor to include both benign and malignant.  We discussed characteristics of size, complexity and vascularity.  We discussed options to include observation versus surgical intervention and removal.  I recommended she start with ultrasound for better characterization of this cyst and then go from there.  Patient will schedule and follow-up with me for this.  We will hold on other studies such as CA 125 until ultrasound characterization.    Anastasio Auerbach MD, 4:51 PM 09/27/2019

## 2019-09-27 NOTE — Patient Instructions (Signed)
Follow-up for the ultrasound as scheduled. 

## 2019-09-27 NOTE — Progress Notes (Signed)
REFERRING PROVIDER: Abner Greenspan, MD [place],  [place] [address]  PRIMARY PROVIDER:  Abner Greenspan, MD  PRIMARY REASON FOR VISIT:  1. Malignant neoplasm of upper-inner quadrant of right breast in female, estrogen receptor positive ([place])   2. Family history of prostate cancer   3. Family history of breast cancer      HISTORY OF PRESENT ILLNESS:   I connected with  Jennifer [name] on 09/27/2019 at 9 AM EDT by [facility] video conference and verified that I am speaking with the correct person using two identifiers.   Patient location: Home Provider location: Office  Jennifer [name], a 63 y.o. female, was seen for a [place] cancer genetics consultation at the request of Dr. Glori Bickers due to a personal and family history of breast cancer.  Jennifer [name] presents to clinic today to discuss the possibility of a hereditary predisposition to cancer, genetic testing, and to further clarify her future cancer risks, as well as potential cancer risks for family members.   In October 2019, at the age of 48, Jennifer [name] was diagnosed with invasive ductal carcinoma of the right breast. The treatment plan included lumpectomy and radiation. Jennifer [name] was seen in genetics about a year ago at the time of diagnosis and declined testing at that time.  She is revisiting this discussion today.   CANCER HISTORY:  Oncology History  Malignant neoplasm of upper-inner quadrant of right breast in female, estrogen receptor positive ([place])  09/13/2018 Initial Diagnosis   Diagnostic mammogram detected right breast mass with distortion LIQ at 2:00 9 cm from nipple 1.7 cm, at 1:00 there was a benign 1.2 cm fibrocystic change, biopsy of the 2:00 mass revealed grade 1 IDC with DCIS ER 100%, PR 90%, Ki-67 10%, HER-2 1+ negative, T1CN0 stage Ia clinical stage   10/10/2018 Surgery   Right lumpectomy: IDC grade 1, 1.2 cm, with intermediate grade DCIS, margins negative, 0/2 lymph nodes negative, T1CN0 stage Ia ER 100%, PR 90%, Ki-67  10%, HER-2 1+ negative    10/10/2018 Oncotype testing   oncotype results of 29: Risk of recurrence without chemo 18%   11/07/2018 - 12/08/2018 Radiation Therapy   1. Right Breast / 42.56 Gy in 16 fractions 2. Right Breast Boost / 8 Gy in 4 fractions - Total dose 50.56 Gy   12/2018 -  Anti-estrogen oral therapy   Anastrozole daily, planned for 7 years      RISK FACTORS:  Menarche was at age 63.  First live birth at age N/A.  Ovaries intact: yes.  Hysterectomy: no.  Menopausal status: postmenopausal.  HRT use: 8 years. Colonoscopy: yes; normal. Mammogram within the last year: yes. Number of breast biopsies: 1. Up to date with pelvic exams: yes. Any excessive radiation exposure in the past:  breast cancer treatment  Past Medical History:  Diagnosis Date   Allergic rhinitis    Anxiety    Arthritis    hands, knees   Breast cancer ([place]) 2019   Right Breast Cancer   Cancer ([place]) 09/2018   Breast CA new DX, right breast   Cervical dysplasia    Endometriosis    Esophageal reflux    Family history of breast cancer    Family history of breast cancer    Family history of prostate cancer    Hypothyroid    Migraine with aura, without mention of intractable migraine without mention of status migrainosus    Osteopenia 08/2019   T score -2.2 FRAX  11% / 1.7%   Personal history of radiation therapy 2019   Right Breast Cancer   Pre-diabetes     Past Surgical History:  Procedure Laterality Date   BREAST EXCISIONAL BIOPSY Bilateral over 10 years ago   benign x 2    BREAST LUMPECTOMY Right 10/10/2018   BREAST LUMPECTOMY WITH RADIOACTIVE SEED AND SENTINEL LYMPH NODE BIOPSY Right 10/10/2018   Procedure: RIGHT BREAST LUMPECTOMY WITH RADIOACTIVE SEED AND RIGHT SENTINEL LYMPH NODE BIOPSY;  Surgeon: Jovita Kussmaul, MD;  Location: [facility];  Service: General;  Laterality: Right;   BREAST SURGERY     Benign breast lump excised   CERVICAL CONE  BIOPSY  1985   severe dysplasia   COLONOSCOPY  2005   normal   COLPOSCOPY     ROTATOR CUFF REPAIR  08/2008    Social History   Socioeconomic History   Marital status: Widowed    Spouse name: Not on file   Number of children: Not on file   Years of education: Not on file   Highest education level: Not on file  Occupational History   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  Social Needs   Financial resource strain: Not on file   Food insecurity    Worry: Not on file    Inability: Not on file   Transportation needs    Medical: Not on file    Non-medical: Not on file  Tobacco Use   Smoking status: Never Smoker   Smokeless tobacco: Never Used  Substance and Sexual Activity   Alcohol use: No    Alcohol/week: 0.0 standard drinks   Drug use: No   Sexual activity: Not Currently    Birth control/protection: Post-menopausal    Comment: 1st intercourse 63 yo-5 partners  Lifestyle   Physical activity    Days per week: Not on file    Minutes per session: Not on file   Stress: Not on file  Relationships   Social connections    Talks on phone: Not on file    Gets together: Not on file    Attends religious service: Not on file    Active member of club or organization: Not on file    Attends meetings of clubs or organizations: Not on file    Relationship status: Not on file  Other Topics Concern   Not on file  Social History Narrative   [place] her husband in 9/14   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     FAMILY HISTORY:  We obtained a detailed, 4-generation family history.  Significant diagnoses are listed below: Family History  Problem Relation Age of Onset   Breast cancer Mother 61   Diabetes Mother    Hypertension Father    Heart disease Father    Osteoporosis Maternal Grandmother    Diabetes Maternal Grandmother    Hypertension Sister    COPD Paternal Grandmother    Heart disease Paternal Grandfather    Breast cancer  Cousin 49       pat first cousin   Breast cancer Cousin        mat second cousin with breast cancer in her 74s   Prostate cancer Other 52   Thyroid cancer Cousin 37       pat first cousin    The patient does not have children.  She has one brother and two sisters.  None of her siblings have had cancer.  The patient's mother is living and her father is deceased.  The patient's mother was diagnosed with breast cancer at 45.  She is an only child.  There is no information on her father's family and her mother died at 16.  Her mother had several siblings, some had cancer but the patient is not sure which ones had which cancers.  She knows that one brother had prostate cancer and one sister has a granddaughter who had breast cancer in her 24's (the patient's second cousin).  The patient's father is deceased from heart failure.  He had a brother and sister.  The sister has a grandson with end stage lung cancer and a daughter who was diagnosed with thyroid cancer at 86. The brother has a daughter who had breast cancer at 33. The paternal grandparents are deceased from non cancer related issues.  Jennifer [name] is unaware of previous family history of genetic testing for hereditary cancer risks. Patient's maternal ancestors are of [place] and [place] descent, and paternal ancestors are of [place] and [place] descent. There is no reported Ashkenazi Jewish ancestry. There is no known consanguinity.   GENETIC COUNSELING ASSESSMENT: Jennifer [name] is a 63 y.o. female with a personal and family history of breast cancer which is somewhat suggestive of a hereditary cancer syndrome and predisposition to cancer given the combination of breast and prostate cancer. We, therefore, discussed and recommended the following at today's visit.   DISCUSSION: We discussed that 5 - 10% of breast cancer is hereditary, with most cases associated with BRCA mutations.  There are other genes that can be associated with hereditary  breast cancer syndromes.  We discussed that testing is beneficial for several reasons including knowing how to follow individuals after completing their treatment, and understand if other family members could be at risk for cancer and allow them to undergo genetic testing.   We reviewed the characteristics, features and inheritance patterns of hereditary cancer syndromes. We also discussed genetic testing, including the appropriate family members to test, the process of testing, insurance coverage and turn-around-time for results. We discussed the implications of a negative, positive, carrier and/or variant of uncertain significant result. We recommended Jennifer [name] pursue genetic testing for the common hereditary cancer gene panel. The Common Hereditary Gene Panel offered by [facility] includes sequencing and/or deletion duplication testing of the following 48 genes: APC, ATM, AXIN2, BARD1, BMPR1A, BRCA1, BRCA2, BRIP1, CDH1, CDK4, CDKN2A (p14ARF), CDKN2A (p16INK4a), CHEK2, CTNNA1, DICER1, EPCAM (Deletion/duplication testing only), GREM1 (promoter region deletion/duplication testing only), KIT, MEN1, MLH1, MSH2, MSH3, MSH6, MUTYH, NBN, NF1, NHTL1, PALB2, PDGFRA, PMS2, POLD1, POLE, PTEN, RAD50, RAD51C, RAD51D, RNF43, SDHB, SDHC, SDHD, SMAD4, SMARCA4. STK11, TP53, TSC1, TSC2, and VHL.  The following genes were evaluated for sequence changes only: SDHA and HOXB13 c.251G>A variant only.   Based on Ms. Baggerly's personal and family history of cancer, she meets medical criteria for genetic testing. Despite that she meets criteria, she may still have an out of pocket cost. We discussed that if her out of pocket cost for testing is over $100, the laboratory will call and confirm whether she wants to proceed with testing.  If the out of pocket cost of testing is less than $100 she will be billed by the genetic testing laboratory.   PLAN: After considering the risks, benefits, and limitations, Jennifer [name] provided informed consent  to pursue genetic testing.  A saliva kit will be sent to her from [facility] for analysis of the common hereditary cancer panel. Results should be available within approximately 2-3 weeks' time, at which  point they will be disclosed by telephone to Jennifer [name], as will any additional recommendations warranted by these results. Jennifer [name] will receive a summary of her genetic counseling visit and a copy of her results once available. This information will also be available in Epic.   Lastly, we encouraged Jennifer [name] to remain in contact with cancer genetics annually so that we can continuously update the family history and inform her of any changes in cancer genetics and testing that may be of benefit for this family.   Jennifer [name] questions were answered to her satisfaction today. Our contact information was provided should additional questions or concerns arise. Thank you for the referral and allowing [place] to share in the care of your patient.   Shailyn [name] P. Florene Glen, [place], [facility] Licensed, Insurance risk surveyor [email or web address] phone: [phone]  The patient was seen for a total of 45 minutes in face-to-face genetic counseling.  This patient was discussed with Drs. Magrinat, Lindi Adie and/or Burr Medico who agrees with the above.    _______________________________________________________________________ For Office Staff:  Number of people involved in session: 1 Was an Intern/ student involved with case: yes

## 2019-09-28 LAB — URINE CULTURE
MICRO NUMBER:: 1059468
Result:: NO GROWTH
SPECIMEN QUALITY:: ADEQUATE

## 2019-09-28 LAB — NOVEL CORONAVIRUS, NAA: SARS-CoV-2, NAA: NOT DETECTED

## 2019-10-04 ENCOUNTER — Encounter: Payer: Self-pay | Admitting: Family Medicine

## 2019-10-04 ENCOUNTER — Ambulatory Visit (INDEPENDENT_AMBULATORY_CARE_PROVIDER_SITE_OTHER): Payer: BC Managed Care – PPO | Admitting: Family Medicine

## 2019-10-04 ENCOUNTER — Other Ambulatory Visit: Payer: Self-pay

## 2019-10-04 VITALS — BP 102/64 | HR 79 | Temp 97.5°F | Ht 61.0 in | Wt 128.3 lb

## 2019-10-04 DIAGNOSIS — R7401 Elevation of levels of liver transaminase levels: Secondary | ICD-10-CM | POA: Diagnosis not present

## 2019-10-04 DIAGNOSIS — R7303 Prediabetes: Secondary | ICD-10-CM | POA: Diagnosis not present

## 2019-10-04 DIAGNOSIS — Z1159 Encounter for screening for other viral diseases: Secondary | ICD-10-CM | POA: Diagnosis not present

## 2019-10-04 LAB — POCT GLYCOSYLATED HEMOGLOBIN (HGB A1C): Hemoglobin A1C: 6 % — AB (ref 4.0–5.6)

## 2019-10-04 LAB — HEPATIC FUNCTION PANEL
ALT: 19 U/L (ref 0–35)
AST: 20 U/L (ref 0–37)
Albumin: 4.3 g/dL (ref 3.5–5.2)
Alkaline Phosphatase: 125 U/L — ABNORMAL HIGH (ref 39–117)
Bilirubin, Direct: 0.1 mg/dL (ref 0.0–0.3)
Total Bilirubin: 0.4 mg/dL (ref 0.2–1.2)
Total Protein: 7 g/dL (ref 6.0–8.3)

## 2019-10-04 MED ORDER — METFORMIN HCL ER 500 MG PO TB24: 500.0000 mg | ORAL_TABLET | Freq: Every day | ORAL | 3 refills | Status: DC

## 2019-10-04 NOTE — Progress Notes (Signed)
Subjective:    Patient ID: Jennifer [name], female    DOB: 02/11/1956, 63 y.o.   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 Family history of prostate cancer   . Adnexal cyst 09/26/2019  . Cystitis with hematuria 09/26/2019  . Pain of left calf 03/21/2019  . Use of anastrozole (Arimidex) 03/21/2019  . Elevated transaminase level 01/03/2019  . Family history of breast cancer   . Stress reaction 09/26/2018  . Malignant neoplasm of upper-inner quadrant of right breast in female, estrogen receptor positive ([place]) 09/20/2018  . B12 deficiency 04/13/2018  . Joint pain 04/11/2018  . Skin pain 04/11/2018  . Paresthesia 04/11/2018  . Colon cancer screening 09/16/2017  . Prediabetes 07/28/2016  . Allergic rhinitis 11/13/2014  . Screening for lipoid disorders 09/26/2014  . Preventative health care 09/26/2014  . Routine general medical examination at a health care facility 10/28/2012  . Herpes labialis  04/28/2011  . GERD 09/16/2010  . Hypothyroidism 05/07/2008  . Osteopenia 05/07/2008  . Migraine with aura 11/11/2007   Past Medical History:  Diagnosis Date  . Allergic rhinitis   . Anxiety   . Arthritis    hands, knees  . Breast cancer ([place]) 2019   Right Breast Cancer  . Cancer ([place]) 09/2018   Breast CA new DX, right breast  . Cervical dysplasia   . Endometriosis   . Esophageal reflux   . Family history of breast cancer   . Family history of breast cancer   . Family history of prostate cancer   . Hypothyroid   . Migraine with aura, without mention of intractable migraine without mention of status migrainosus   . Osteopenia 08/2019   T score -2.2 FRAX 11% / 1.7%  . Personal history of radiation therapy 2019   Right Breast Cancer  . Pre-diabetes    Past Surgical History:  Procedure Laterality Date  . BREAST EXCISIONAL BIOPSY Bilateral over 10 years ago   benign x 2   . BREAST LUMPECTOMY Right 10/10/2018  . BREAST LUMPECTOMY WITH RADIOACTIVE SEED AND SENTINEL LYMPH NODE BIOPSY Right 10/10/2018   Procedure: RIGHT BREAST LUMPECTOMY WITH RADIOACTIVE SEED AND RIGHT SENTINEL LYMPH NODE BIOPSY;  Surgeon: Jovita Kussmaul, MD;  Location: [facility];  Service: General;  Laterality: Right;  . BREAST SURGERY     Benign breast lump excised  . CERVICAL CONE BIOPSY  1985   severe dysplasia  . COLONOSCOPY  2005   normal  . COLPOSCOPY    . ROTATOR CUFF REPAIR  08/2008   Social History   Tobacco Use  . Smoking status: Never Smoker  . Smokeless tobacco: Never Used  Substance Use Topics  . Alcohol use: No    Alcohol/week: 0.0 standard drinks  . Drug use: No   Family History  Problem Relation Age of Onset  . Breast cancer Mother 27  . Diabetes Mother   . Hypertension Father   . Heart disease Father   . Osteoporosis Maternal Grandmother   . Diabetes Maternal Grandmother   . Hypertension Sister   . COPD Paternal Grandmother   . Heart disease Paternal  Grandfather   . Breast cancer Cousin 32       pat first cousin  . Breast cancer Cousin        mat second cousin with breast cancer in her 90s  . Prostate cancer Other 86  . Thyroid cancer Cousin 66       pat first cousin   Allergies  Allergen Reactions  . Ciprofloxacin Other (See Comments)    Dizziness   . Codeine Phosphate Other (See Comments)    Dizziness, heart palpitations, nervous  . Decongestant [Pseudoephedrine] Other (See Comments)    dizziness  . Flonase [Fluticasone Propionate] Other (See Comments)    Worsens her migraine   . Hydrocodone Other (See Comments)    Made nervous--10/09 surgery rotator cuff  . Reglan [Metoclopramide] Other (See Comments)    Dizziness  . Topamax [Topiramate] Other (See Comments)    Dizziness   . Benadryl [Diphenhydramine Hcl] Palpitations  . Sulfa Antibiotics Anxiety   Current Outpatient Medications on File Prior to Visit  Medication Sig Dispense Refill  . ALPRAZolam (XANAX) 0.5 MG tablet TAKE 1/2 TO 1 TABLET BY MOUTH ONCE DAILY AS NEEDED FOR SLEEP/ANXIETY 15 tablet 0  . anastrozole (ARIMIDEX) 1 MG tablet Take 1 tablet (1 mg total) by mouth daily. 90 tablet 3  . atenolol (TENORMIN) 25 MG tablet Take 12.5 mg by mouth 3 (three) times daily. For migraine prevention    . baclofen (LIORESAL) 10 MG tablet Take 10 mg by mouth daily as needed (migraines).     . Calcium Carbonate-Vitamin D 600-400 MG-UNIT tablet Take 2 tablets by mouth daily.     . cyanocobalamin 2000 MCG tablet Take 2,000 mcg by mouth daily.    . famotidine (PEPCID) 40 MG tablet Take 40 mg by mouth 2 (two) times daily.     Marland Kitchen glucose blood (TRUE METRIX BLOOD GLUCOSE TEST) test strip Use to check blood sugar two times a day. 100 each 6  . hydrOXYzine (ATARAX/VISTARIL) 25 MG tablet Take 1 tablet (25 mg total) by mouth 3 (three) times daily as needed. (Patient taking differently: Take 25 mg by mouth 2 (two) times daily. ) 30 tablet 0  . ketorolac (TORADOL) 10 MG tablet Take 1 tablet  (10 mg total) by mouth every 6 (six) hours as needed. (Patient taking differently: Take 10 mg by mouth every 6 (six) hours as needed for moderate pain. ) 20 tablet 0  . Lancets (ACCU-CHEK MULTICLIX) lancets Use to check blood sugar two times a day. 100 each 6  . levocetirizine (XYZAL) 5 MG tablet Take 1 tablet by mouth every evening.  3  .  levothyroxine (SYNTHROID) 75 MCG tablet TAKE ONE TABLET BY MOUTH ONE TIME DAILY  30 tablet 5  . Magnesium Oxide 400 (240 Mg) MG TABS Take by mouth.    . Misc Natural Products (GLUCOSAMINE CHOND COMPLEX/MSM) TABS Take 1 tablet by mouth daily.     . Multiple Vitamin (MULTIVITAMIN WITH MINERALS) TABS tablet Take 1 tablet by mouth daily.    . nabumetone (RELAFEN) 500 MG tablet Take 500 mg by mouth daily as needed (migraines).     . Omega-3 Fatty Acids (FISH OIL) 1000 MG CAPS Take 1,000 mg by mouth daily.     . OnabotulinumtoxinA (BOTOX IJ) Inject 1 Dose as directed every 6 (six) weeks.     . ondansetron (ZOFRAN ODT) 4 MG disintegrating tablet Take 1 tablet (4 mg total) by mouth every 8 (eight) hours as needed. 15 tablet 0  . pantoprazole (PROTONIX) 40 MG tablet TAKE ONE TABLET BY MOUTH ONE TIME DAILY  30 tablet 5  . Probiotic Product (PROBIOTIC-10 PO) Take 1 capsule by mouth daily.     . promethazine (PHENERGAN) 25 MG tablet Take 12.5 mg by mouth daily as needed for nausea or vomiting. When having a migraine    . TURMERIC PO Take 1 tablet by mouth daily.     . rizatriptan (MAXALT) 10 MG tablet Take 1 tablet (10 mg total) by mouth once as needed for up to 10 days for migraine. May repeat in 2 hours if needed 10 tablet 0   No current facility-administered medications on file prior to visit.     Review of Systems  Constitutional: Negative for activity change, appetite change, fatigue, fever and unexpected weight change.  HENT: Negative for congestion, ear pain, rhinorrhea, sinus pressure and sore throat.   Eyes: Negative for pain, redness and visual disturbance.   Respiratory: Negative for cough, shortness of breath and wheezing.   Cardiovascular: Negative for chest pain and palpitations.  Gastrointestinal: Negative for abdominal pain, blood in stool, constipation and diarrhea.  Endocrine: Negative for cold intolerance, heat intolerance, polydipsia, polyphagia and polyuria.  Genitourinary: Negative for dysuria, frequency and urgency.  Musculoskeletal: Negative for arthralgias, back pain and myalgias.  Skin: Negative for pallor and rash.  Allergic/Immunologic: Negative for environmental allergies.  Neurological: Negative for dizziness, syncope and headaches.       Toes feel tingly sometimes   Hematological: Negative for adenopathy. Does not bruise/bleed easily.  Psychiatric/Behavioral: Negative for decreased concentration and dysphoric mood. The patient is not nervous/anxious.        Objective:   Physical Exam Constitutional:      General: She is not in acute distress.    Appearance: Normal appearance. She is well-developed and normal weight. She is not ill-appearing or diaphoretic.  HENT:     Head: Normocephalic and atraumatic.  Eyes:     Conjunctiva/sclera: Conjunctivae normal.     Pupils: Pupils are equal, round, and reactive to light.  Neck:     Musculoskeletal: Normal range of motion and neck supple.     Thyroid: No thyromegaly.     Vascular: No carotid bruit or JVD.  Cardiovascular:     Rate and Rhythm: Normal rate and regular rhythm.     Heart sounds: Normal heart sounds. No gallop.   Pulmonary:     Effort: Pulmonary effort is normal. No respiratory distress.     Breath sounds: Normal breath sounds. No wheezing or rales.  Abdominal:     General: Bowel sounds are normal. There is no distension or  abdominal bruit.     Palpations: Abdomen is soft. There is no mass.     Tenderness: There is no abdominal tenderness.  Musculoskeletal:     Right lower leg: No edema.     Left lower leg: No edema.  Lymphadenopathy:     Cervical: No  cervical adenopathy.  Skin:    General: Skin is warm and dry.     Findings: No rash.  Neurological:     Mental Status: She is alert.     Sensory: No sensory deficit.     Deep Tendon Reflexes: Reflexes are normal and symmetric.           Assessment & Plan:   Problem List Items Addressed This Visit      Other   Prediabetes - Primary    Pt wishes to start low dose metformin  Px metformin xr 500 mg daily  inst to continue low glycemic diet  Also get back into exercise habit   A1C today      Relevant Orders   HgB A1c (Completed)   Elevated transaminase level   Relevant Orders   Hepatitis C Antibody    Other Visit Diagnoses    Need for hepatitis C screening test       Relevant Orders   Hepatitis C Antibody

## 2019-10-04 NOTE — Patient Instructions (Signed)
Keep eating well !  Add some exercise 30 minutes or more per day Try to do different things Get outdoors when you can   A1c today   Start the metformin xr 500 mg once daily  Let us know if you have any problems

## 2019-10-04 NOTE — Assessment & Plan Note (Signed)
Pt wishes to start low dose metformin  Px metformin xr 500 mg daily  inst to continue low glycemic diet  Also get back into exercise habit   A1C today

## 2019-10-05 ENCOUNTER — Ambulatory Visit (INDEPENDENT_AMBULATORY_CARE_PROVIDER_SITE_OTHER): Payer: BC Managed Care – PPO

## 2019-10-05 ENCOUNTER — Ambulatory Visit (INDEPENDENT_AMBULATORY_CARE_PROVIDER_SITE_OTHER): Payer: BC Managed Care – PPO | Admitting: Gynecology

## 2019-10-05 ENCOUNTER — Encounter: Payer: Self-pay | Admitting: Gynecology

## 2019-10-05 VITALS — BP 118/76

## 2019-10-05 DIAGNOSIS — N83201 Unspecified ovarian cyst, right side: Secondary | ICD-10-CM

## 2019-10-05 LAB — ACUTE HEP PANEL AND HEP B SURFACE AB
HEPATITIS C ANTIBODY REFILL$(REFL): NONREACTIVE
Hep A IgM: NONREACTIVE
Hep B C IgM: NONREACTIVE
Hepatitis B Surface Ag: NONREACTIVE
SIGNAL TO CUT-OFF: 0.02 (ref <1.00)

## 2019-10-05 LAB — HEPATITIS C ANTIBODY
Hepatitis C Ab: NONREACTIVE
SIGNAL TO CUT-OFF: 0.02 (ref <1.00)

## 2019-10-05 LAB — REFLEX TIQ

## 2019-10-05 NOTE — Progress Notes (Signed)
    Jennifer [name] 02/14/1956 [id number]        63 y.o.  G0P0 presents for ultrasound.  Recently had renal stone protocol CT scan due to urinary symptoms and a 4.5 cm right adnexal cyst was noted.  She is having no GYN issues or bleeding.  Past medical history,surgical history, problem list, medications, allergies, family history and social history were all reviewed and documented in the EPIC chart.  Directed ROS with pertinent positives and negatives documented in the history of present illness/assessment and plan.  Exam: Vitals:   10/05/19 1213  BP: 118/76   General appearance:  Normal  Ultrasound transvaginal shows uterus overall normal size with 4 measured myomas the largest 2.48 cm.  Endometrial echo 2.15 mm.  Left ovary small and atrophic in appearance.  Right ovary with a 4.5 x 3.8 x 2.8 cm (37 mm mean) thin-walled avascular simple cyst.  Small amount of free fluid noted in the cul-de-sac.  Assessment/Plan:  63 y.o. G0P0 with small avascular simple right ovarian cyst as incidental finding on recent CT scan.  We discussed differential to include physiologic, benign tumor and ovarian malignancy.  Given the overall appearance highly unlikely a malignancy.  We also discussed the small myomas.  Options for management were reviewed to include expectant with follow-up ultrasound in 3 months to relook at this area versus proceeding with surgery.  The patient is very anxious about the possibility of malignancy given her history of breast cancer.  I reviewed with her although highly unlikely no guarantees.  Recommend starting with CA 125 and if negative then her choice as far as expectant with follow-up ultrasound in 3 months versus proceeding with surgery recognizing this is weighed against surgical risks.  If CA 125 elevated then will move towards scheduling surgery.  She does understand that I am retiring and that she would follow-up with my partner for further management.   Anastasio Auerbach MD,  12:42 PM 10/05/2019

## 2019-10-05 NOTE — Patient Instructions (Signed)
Office will contact you with the CA-125 blood test results.  We will then decide on a plan as far as whether to repeat the ultrasound in 3 months or proceed with surgery.

## 2019-10-06 ENCOUNTER — Other Ambulatory Visit: Payer: Self-pay | Admitting: Gynecology

## 2019-10-06 ENCOUNTER — Encounter: Payer: Self-pay | Admitting: Gynecology

## 2019-10-06 DIAGNOSIS — N83201 Unspecified ovarian cyst, right side: Secondary | ICD-10-CM

## 2019-10-06 LAB — CA 125: CA 125: 23 U/mL (ref <35)

## 2019-10-08 NOTE — Progress Notes (Signed)
Patient Care Team: Tower, [name], MD as PCP - General Nicholas Lose, MD as Consulting Physician (Hematology and Oncology) Kyung Rudd, MD as Consulting Physician (Radiation Oncology) Jovita Kussmaul, MD as Consulting Physician (General Surgery)  DIAGNOSIS:    ICD-10-CM   1. Malignant neoplasm of upper-inner quadrant of right breast in female, estrogen receptor positive ([place])  C50.211    Z17.0     SUMMARY OF ONCOLOGIC HISTORY: Oncology History  Malignant neoplasm of upper-inner quadrant of right breast in female, estrogen receptor positive ([place])  09/13/2018 Initial Diagnosis   Diagnostic mammogram detected right breast mass with distortion LIQ at 2:00 9 cm from nipple 1.7 cm, at 1:00 there was a benign 1.2 cm fibrocystic change, biopsy of the 2:00 mass revealed grade 1 IDC with DCIS ER 100%, PR 90%, Ki-67 10%, HER-2 1+ negative, T1CN0 stage Ia clinical stage   10/10/2018 Surgery   Right lumpectomy: IDC grade 1, 1.2 cm, with intermediate grade DCIS, margins negative, 0/2 lymph nodes negative, T1CN0 stage Ia ER 100%, PR 90%, Ki-67 10%, HER-2 1+ negative    10/10/2018 Oncotype testing   oncotype results of 29: Risk of recurrence without chemo 18%   11/07/2018 - 12/08/2018 Radiation Therapy   1. Right Breast / 42.56 Gy in 16 fractions 2. Right Breast Boost / 8 Gy in 4 fractions - Total dose 50.56 Gy   12/2018 -  Anti-estrogen oral therapy   Anastrozole daily, planned for 7 years     CHIEF COMPLIANT: Follow-up of right breast cancer on anastrozole  INTERVAL HISTORY: Jennifer [name] is a 63 y.o. with above-mentioned history of right breast cancer who underwent lumpectomy, radiation, and is currently on anastrozole. Mammogram on 09/08/19 showed no evidence of malignancy bilaterally. She presents to the clinic today for follow-up.  She is experiencing lots of myalgias and stiffness and trigger fingers.  She also had abdominal discomfort and CT of the abdomen revealed cystitis and ovarian  adnexal cyst.  REVIEW OF SYSTEMS:   Constitutional: Denies fevers, chills or abnormal weight loss Eyes: Denies blurriness of vision Ears, nose, mouth, throat, and face: Denies mucositis or sore throat Respiratory: Denies cough, dyspnea or wheezes Cardiovascular: Denies palpitation, chest discomfort Gastrointestinal: abd discomfort Skin: Denies abnormal skin rashes Lymphatics: Denies new lymphadenopathy or easy bruising Neurological: Denies numbness, tingling or new weaknesses Behavioral/Psych: Mood is stable, no new changes  Extremities: No lower extremity edema Breast: denies any pain or lumps or nodules in either breasts All other systems were reviewed with the patient and are negative.  I have reviewed the past medical history, past surgical history, social history and family history with the patient and they are unchanged from previous note.  ALLERGIES:  is allergic to ciprofloxacin; codeine phosphate; decongestant [pseudoephedrine]; flonase [fluticasone propionate]; hydrocodone; reglan [metoclopramide]; topamax [topiramate]; benadryl [diphenhydramine hcl]; and sulfa antibiotics.  MEDICATIONS:  Current Outpatient Medications  Medication Sig Dispense Refill   ALPRAZolam (XANAX) 0.5 MG tablet TAKE 1/2 TO 1 TABLET BY MOUTH ONCE DAILY AS NEEDED FOR SLEEP/ANXIETY 15 tablet 0   atenolol (TENORMIN) 25 MG tablet Take 12.5 mg by mouth 3 (three) times daily. For migraine prevention     baclofen (LIORESAL) 10 MG tablet Take 10 mg by mouth daily as needed (migraines).      Calcium Carbonate-Vitamin D 600-400 MG-UNIT tablet Take 2 tablets by mouth daily.      cyanocobalamin 2000 MCG tablet Take 2,000 mcg by mouth daily.     famotidine (PEPCID) 40 MG tablet Take 40  mg by mouth 2 (two) times daily.      glucose blood (TRUE METRIX BLOOD GLUCOSE TEST) test strip Use to check blood sugar two times a day. 100 each 6   hydrOXYzine (ATARAX/VISTARIL) 25 MG tablet Take 1 tablet (25 mg total) by  mouth 3 (three) times daily as needed. (Patient taking differently: Take 25 mg by mouth 2 (two) times daily. ) 30 tablet 0   ketorolac (TORADOL) 10 MG tablet Take 1 tablet (10 mg total) by mouth every 6 (six) hours as needed. (Patient taking differently: Take 10 mg by mouth every 6 (six) hours as needed for moderate pain. ) 20 tablet 0   Lancets (ACCU-CHEK MULTICLIX) lancets Use to check blood sugar two times a day. 100 each 6   letrozole (FEMARA) 2.5 MG tablet Take 1 tablet (2.5 mg total) by mouth daily. 90 tablet 3   levocetirizine (XYZAL) 5 MG tablet Take 1 tablet by mouth every evening.  3   levothyroxine (SYNTHROID) 75 MCG tablet TAKE ONE TABLET BY MOUTH ONE TIME DAILY  30 tablet 5   Magnesium Oxide 400 (240 Mg) MG TABS Take by mouth.     metFORMIN (GLUCOPHAGE XR) 500 MG 24 hr tablet Take 1 tablet (500 mg total) by mouth daily with breakfast. 30 tablet 3   Misc Natural Products (GLUCOSAMINE CHOND COMPLEX/MSM) TABS Take 1 tablet by mouth daily.      Multiple Vitamin (MULTIVITAMIN WITH MINERALS) TABS tablet Take 1 tablet by mouth daily.     nabumetone (RELAFEN) 500 MG tablet Take 500 mg by mouth daily as needed (migraines).      Omega-3 Fatty Acids (FISH OIL) 1000 MG CAPS Take 1,000 mg by mouth daily.      OnabotulinumtoxinA (BOTOX IJ) Inject 1 Dose as directed every 6 (six) weeks.      ondansetron (ZOFRAN ODT) 4 MG disintegrating tablet Take 1 tablet (4 mg total) by mouth every 8 (eight) hours as needed. 15 tablet 0   pantoprazole (PROTONIX) 40 MG tablet TAKE ONE TABLET BY MOUTH ONE TIME DAILY  30 tablet 5   Probiotic Product (PROBIOTIC-10 PO) Take 1 capsule by mouth daily.      promethazine (PHENERGAN) 25 MG tablet Take 12.5 mg by mouth daily as needed for nausea or vomiting. When having a migraine     rizatriptan (MAXALT) 10 MG tablet Take 1 tablet (10 mg total) by mouth once as needed for up to 10 days for migraine. May repeat in 2 hours if needed 10 tablet 0   TURMERIC PO  Take 1 tablet by mouth daily.      No current facility-administered medications for this visit.     PHYSICAL EXAMINATION: ECOG PERFORMANCE STATUS: 1 - Symptomatic but completely ambulatory  Vitals:   10/09/19 1447  BP: 136/66  Pulse: 65  Resp: 18  Temp: 98.3 F (36.8 C)  SpO2: 100%   Filed Weights   10/09/19 1447  Weight: 129 lb 12.8 oz (58.9 kg)    GENERAL: alert, no distress and comfortable SKIN: skin color, texture, turgor are normal, no rashes or significant lesions EYES: normal, Conjunctiva are pink and non-injected, sclera clear OROPHARYNX: no exudate, no erythema and lips, buccal mucosa, and tongue normal  NECK: supple, thyroid normal size, non-tender, without nodularity LYMPH: no palpable lymphadenopathy in the cervical, axillary or inguinal LUNGS: clear to auscultation and percussion with normal breathing effort HEART: regular rate & rhythm and no murmurs and no lower extremity edema ABDOMEN: abdomen soft, non-tender and  normal bowel sounds MUSCULOSKELETAL: no cyanosis of digits and no clubbing  NEURO: alert & oriented x 3 with fluent speech, no focal motor/sensory deficits EXTREMITIES: No lower extremity edema  LABORATORY DATA:  I have reviewed the data as listed CMP Latest Ref Rng & Units 10/04/2019 12/30/2018 09/24/2018  Glucose 70 - 99 mg/dL - 99 151(H)  BUN 6 - 23 mg/dL - 16 12  Creatinine 0.40 - 1.20 mg/dL - 0.75 0.60  Sodium 135 - 145 mEq/L - 138 136  Potassium 3.5 - 5.1 mEq/L - 4.4 4.1  Chloride 96 - 112 mEq/L - 101 99  CO2 19 - 32 mEq/L - 30 -  Calcium 8.4 - 10.5 mg/dL - 9.7 -  Total Protein 6.0 - 8.3 g/dL 7.0 7.1 -  Total Bilirubin 0.2 - 1.2 mg/dL 0.4 0.6 -  Alkaline Phos 39 - 117 U/L 125(H) 107 -  AST 0 - 37 U/L 20 54(H) -  ALT 0 - 35 U/L 19 87(H) -    Lab Results  Component Value Date   WBC 5.6 12/30/2018   HGB 13.4 12/30/2018   HCT 39.7 12/30/2018   MCV 84.7 12/30/2018   PLT 272.0 12/30/2018   NEUTROABS 3.4 12/30/2018    ASSESSMENT &  PLAN:  Malignant neoplasm of upper-inner quadrant of right breast in female, estrogen receptor positive ([place]) 10/10/2018:Right lumpectomy: IDC grade 1, 1.2 cm, with intermediate grade DCIS, margins negative, 0/2 lymph nodes negative, T1CN0 stage Ia ER 100%, PR 90%, Ki-67 10%, HER-2 1+ negative Oncotype DX: 29: 18% risk of recurrence  Treatment plan: 1.I recommended systemic adjuvant chemotherapy withTaxotere and Cytoxan every 3 weeks x4 cycles (patient refused) 2. Adjuvant radiation therapy 11/08/2018-12/05/2018 3. Adjuvant antiestrogen therapy with anastrozole started 12/05/2018 switched to letrozole 10/09/19  Anastrozole toxicities:myalgias We will switch to Letrozole  Breast cancer surveillance: 1.  Mammogram 10/09/2019: Benign breast density category D 2.  Breast exam 10/09/2019: Benign 3.  CT abdomen 09/18/2019: Acute cystitis, 4.5 cm right adnexal cyst 4.  Bone density 08/24/2019: T score -2.2: Osteopenia  RTC in 1 year   No orders of the defined types were placed in this encounter.  The patient has a good understanding of the overall plan. she agrees with it. she will call with any problems that may develop before the next visit here.  Nicholas Lose, MD 10/09/2019  Julious Oka Dorshimer, am acting as scribe for Dr. Nicholas Lose.  I have reviewed the above documentation for accuracy and completeness, and I agree with the above.

## 2019-10-09 ENCOUNTER — Inpatient Hospital Stay (HOSPITAL_BASED_OUTPATIENT_CLINIC_OR_DEPARTMENT_OTHER): Payer: BC Managed Care – PPO | Admitting: Hematology and Oncology

## 2019-10-09 ENCOUNTER — Other Ambulatory Visit: Payer: Self-pay

## 2019-10-09 DIAGNOSIS — Z79899 Other long term (current) drug therapy: Secondary | ICD-10-CM | POA: Diagnosis not present

## 2019-10-09 DIAGNOSIS — Z79811 Long term (current) use of aromatase inhibitors: Secondary | ICD-10-CM | POA: Diagnosis not present

## 2019-10-09 DIAGNOSIS — Z923 Personal history of irradiation: Secondary | ICD-10-CM | POA: Diagnosis not present

## 2019-10-09 DIAGNOSIS — Z7984 Long term (current) use of oral hypoglycemic drugs: Secondary | ICD-10-CM | POA: Diagnosis not present

## 2019-10-09 DIAGNOSIS — C50211 Malignant neoplasm of upper-inner quadrant of right female breast: Secondary | ICD-10-CM

## 2019-10-09 DIAGNOSIS — Z791 Long term (current) use of non-steroidal anti-inflammatories (NSAID): Secondary | ICD-10-CM | POA: Diagnosis not present

## 2019-10-09 DIAGNOSIS — Z170 Estrogen receptor positive status [ER+]: Secondary | ICD-10-CM

## 2019-10-09 MED ORDER — LETROZOLE 2.5 MG PO TABS: 2.5000 mg | ORAL_TABLET | Freq: Every day | ORAL | 3 refills | Status: DC

## 2019-10-09 NOTE — Assessment & Plan Note (Signed)
10/10/2018:Right lumpectomy: IDC grade 1, 1.2 cm, with intermediate grade DCIS, margins negative, 0/2 lymph nodes negative, T1CN0 stage Ia ER 100%, PR 90%, Ki-67 10%, HER-2 1+ negative Oncotype DX: 29: 18% risk of recurrence  Treatment plan: 1.I recommended systemic adjuvant chemotherapy withTaxotere and Cytoxan every 3 weeks x4 cycles (patient refused) 2. Adjuvant radiation therapy 11/08/2018-12/05/2018 3. Adjuvant antiestrogen therapy with letrozole started 12/05/2018  Letrozole toxicities:  Breast cancer surveillance: 1.  Mammogram 10/09/2019: Benign breast density category D 2.  Breast exam 10/09/2019: Benign 3.  CT abdomen 09/18/2019: Acute cystitis, 4.5 cm right adnexal cyst 4.  Bone density 08/24/2019: T score -2.2: Osteopenia  We discussed the role of MRIs with high breast densities.  Given her history, we recommended that she get a breast MRI in December 2020.

## 2019-10-10 ENCOUNTER — Other Ambulatory Visit: Payer: Self-pay

## 2019-10-10 ENCOUNTER — Telehealth: Payer: Self-pay | Admitting: Hematology and Oncology

## 2019-10-10 ENCOUNTER — Ambulatory Visit (INDEPENDENT_AMBULATORY_CARE_PROVIDER_SITE_OTHER): Payer: BC Managed Care – PPO | Admitting: Psychology

## 2019-10-10 DIAGNOSIS — F411 Generalized anxiety disorder: Secondary | ICD-10-CM | POA: Diagnosis not present

## 2019-10-10 DIAGNOSIS — J301 Allergic rhinitis due to pollen: Secondary | ICD-10-CM | POA: Diagnosis not present

## 2019-10-10 DIAGNOSIS — R21 Rash and other nonspecific skin eruption: Secondary | ICD-10-CM | POA: Diagnosis not present

## 2019-10-10 DIAGNOSIS — Z20822 Contact with and (suspected) exposure to covid-19: Secondary | ICD-10-CM

## 2019-10-10 DIAGNOSIS — L298 Other pruritus: Secondary | ICD-10-CM | POA: Diagnosis not present

## 2019-10-10 NOTE — Telephone Encounter (Signed)
I left a message regarding schedule  

## 2019-10-12 LAB — NOVEL CORONAVIRUS, NAA: SARS-CoV-2, NAA: NOT DETECTED

## 2019-10-17 ENCOUNTER — Other Ambulatory Visit: Payer: Self-pay

## 2019-10-17 ENCOUNTER — Encounter: Payer: Self-pay | Admitting: Obstetrics & Gynecology

## 2019-10-17 ENCOUNTER — Ambulatory Visit (INDEPENDENT_AMBULATORY_CARE_PROVIDER_SITE_OTHER): Payer: BC Managed Care – PPO | Admitting: Obstetrics & Gynecology

## 2019-10-17 VITALS — BP 132/68

## 2019-10-17 DIAGNOSIS — N83201 Unspecified ovarian cyst, right side: Secondary | ICD-10-CM

## 2019-10-17 DIAGNOSIS — Z20822 Contact with and (suspected) exposure to covid-19: Secondary | ICD-10-CM

## 2019-10-17 DIAGNOSIS — G43019 Migraine without aura, intractable, without status migrainosus: Secondary | ICD-10-CM | POA: Diagnosis not present

## 2019-10-17 DIAGNOSIS — G43719 Chronic migraine without aura, intractable, without status migrainosus: Secondary | ICD-10-CM | POA: Diagnosis not present

## 2019-10-17 DIAGNOSIS — Z853 Personal history of malignant neoplasm of breast: Secondary | ICD-10-CM | POA: Diagnosis not present

## 2019-10-17 DIAGNOSIS — M542 Cervicalgia: Secondary | ICD-10-CM | POA: Diagnosis not present

## 2019-10-17 NOTE — Progress Notes (Signed)
    Jennifer [name] [birth year] [id number]        63 y.o.  G0   RP: Rt Ovarian cyst  HPI:  Incidental finding of a Rt Ovarian Cyst on CT scan.  No PMB.  No pelvic pain.  H/O Rt Breast invasive Ductal Ca grade 1 with ER positive.  Rt breast lumpectomy 09/2018 with Radioactive seed.  On Letrozole 2.5 mg daily currently.  Pending genetic testing.   OB History  Gravida Para Term Preterm AB Living  0 0 0 0 0 0  SAB TAB Ectopic Multiple Live Births  0 0 0 0 0    Past medical history,surgical history, problem list, medications, allergies, family history and social history were all reviewed and documented in the EPIC chart.   Directed ROS with pertinent positives and negatives documented in the history of present illness/assessment and plan.  Exam:  Vitals:   10/17/19 1043  BP: 132/68   General appearance:  Normal  Abdomen: Normal  Gynecologic exam: Vulva normal.  Bimanual exam:   Pelvic US 10/05/2019: Ultrasound transvaginal shows uterus overall normal size with 4 measured myomas the largest 2.48 cm. Endometrial echo 2.15 mm. Left ovary small and atrophic in appearance. Right ovary with a 4.5 x 3.8 x 2.8 cm (37 mm mean) thin-walled avascular simple cyst. Small amount of free fluid noted in the cul-de-sac.  Ca 125 normal at 23   Assessment/Plan:  63 y.o. G0P0000   1. Cyst of right ovary Incidental finding of a simple right ovarian cyst.  The cyst is measured at 4.5 cm on pelvic ultrasound October 05, 2019.  At that time a Ca1 25 was done which was normal at 23.  Given patient's history of right breast invasive ductal cancer with estrogen receptors positive on letrozole, she feels more comfortable proceeding with a bilateral salpingo-oophorectomy.  Genetic screening pending.  Decision to proceed with a LPS BSO with peritoneal washings.  Patient voiced understanding and agreement with plan.  Patient will follow-up for preop visit.  2. History of breast cancer Invasive ductal cancer  of the right breast, estrogen receptors positive.  Status post right lumpectomy in November 2019.  A radioactive seed was put in place at that time.  Patient is currently on letrozole 2.5 mg daily.  Counseling on above issues and coordination of care more than 50% for 25 minutes.  Princess Bruins MD, 10:59 AM 10/17/2019

## 2019-10-18 ENCOUNTER — Telehealth: Payer: Self-pay

## 2019-10-18 ENCOUNTER — Encounter: Payer: Self-pay | Admitting: Obstetrics & Gynecology

## 2019-10-18 DIAGNOSIS — L820 Inflamed seborrheic keratosis: Secondary | ICD-10-CM | POA: Diagnosis not present

## 2019-10-18 DIAGNOSIS — D225 Melanocytic nevi of trunk: Secondary | ICD-10-CM | POA: Diagnosis not present

## 2019-10-18 DIAGNOSIS — D2272 Melanocytic nevi of left lower limb, including hip: Secondary | ICD-10-CM | POA: Diagnosis not present

## 2019-10-18 DIAGNOSIS — D2262 Melanocytic nevi of left upper limb, including shoulder: Secondary | ICD-10-CM | POA: Diagnosis not present

## 2019-10-18 DIAGNOSIS — D485 Neoplasm of uncertain behavior of skin: Secondary | ICD-10-CM | POA: Diagnosis not present

## 2019-10-18 DIAGNOSIS — D2261 Melanocytic nevi of right upper limb, including shoulder: Secondary | ICD-10-CM | POA: Diagnosis not present

## 2019-10-18 DIAGNOSIS — D2239 Melanocytic nevi of other parts of face: Secondary | ICD-10-CM | POA: Diagnosis not present

## 2019-10-18 DIAGNOSIS — Z1283 Encounter for screening for malignant neoplasm of skin: Secondary | ICD-10-CM | POA: Diagnosis not present

## 2019-10-18 HISTORY — PX: OTHER SURGICAL HISTORY: SHX169

## 2019-10-18 NOTE — Patient Instructions (Signed)
1. Cyst of right ovary Incidental finding of a simple right ovarian cyst.  The cyst is measured at 4.5 cm on pelvic ultrasound October 05, 2019.  At that time a Ca1 25 was done which was normal at 23.  Given patient's history of right breast invasive ductal cancer with estrogen receptors positive on letrozole, she feels more comfortable proceeding with a bilateral salpingo-oophorectomy.  Genetic screening pending.  Decision to proceed with a LPS BSO with peritoneal washings.  Patient voiced understanding and agreement with plan.  Patient will follow-up for preop visit.  2. History of breast cancer Invasive ductal cancer of the right breast, estrogen receptors positive.  Status post right lumpectomy in November 2019.  A radioactive seed was put in place at that time.  Patient is currently on letrozole 2.5 mg daily.  Jennifer [name], it was a pleasure meeting you today!

## 2019-10-18 NOTE — Telephone Encounter (Signed)
I spoke with patient about scheduling surgery. Her ded and co-ins are met and therefore, no surgery prepymt due.  I scheduled her for 10/27/19 at 1:00pm at [facility].  She will see ML for pre op and that was scheduled. Covid screen was scheduled and patient was advised regarding quarantine instructions and general instructions. [facility] pamphlet and Covid sheet mailed.

## 2019-10-19 LAB — NOVEL CORONAVIRUS, NAA: SARS-CoV-2, NAA: NOT DETECTED

## 2019-10-23 ENCOUNTER — Telehealth: Payer: Self-pay | Admitting: *Deleted

## 2019-10-23 DIAGNOSIS — Z8042 Family history of malignant neoplasm of prostate: Secondary | ICD-10-CM | POA: Diagnosis not present

## 2019-10-23 DIAGNOSIS — C50211 Malignant neoplasm of upper-inner quadrant of right female breast: Secondary | ICD-10-CM | POA: Diagnosis not present

## 2019-10-23 DIAGNOSIS — Z803 Family history of malignant neoplasm of breast: Secondary | ICD-10-CM | POA: Diagnosis not present

## 2019-10-23 NOTE — Telephone Encounter (Signed)
Received call from pt requesting education on starting Letrozole.  Pt previously on Anastrozole and is concerned the Letrozole will cause the same side effects as the Anastrozle.  RN educated pt that there is a chance she could experience the same symptoms but we are unsure until she is on it for a few weeks.  RN also educated pt to wait to start taking the Letrozole until December 14th.  Pt is having surgery on 12/4 and it would be best to wait until she heals from that surgery before starting a new medication.  Pt verbalized understanding and appreciative of the advice.

## 2019-10-24 ENCOUNTER — Other Ambulatory Visit (HOSPITAL_COMMUNITY)
Admission: RE | Admit: 2019-10-24 | Discharge: 2019-10-24 | Disposition: A | Discharge status: Home / Self Care | Payer: BC Managed Care – PPO | Source: Ambulatory Visit | Attending: Obstetrics & Gynecology | Admitting: Obstetrics & Gynecology

## 2019-10-24 ENCOUNTER — Other Ambulatory Visit: Payer: Self-pay

## 2019-10-24 DIAGNOSIS — Z01812 Encounter for preprocedural laboratory examination: Secondary | ICD-10-CM | POA: Diagnosis not present

## 2019-10-24 DIAGNOSIS — Z20828 Contact with and (suspected) exposure to other viral communicable diseases: Secondary | ICD-10-CM | POA: Insufficient documentation

## 2019-10-25 ENCOUNTER — Ambulatory Visit (INDEPENDENT_AMBULATORY_CARE_PROVIDER_SITE_OTHER): Payer: BC Managed Care – PPO | Admitting: Obstetrics & Gynecology

## 2019-10-25 ENCOUNTER — Encounter (HOSPITAL_BASED_OUTPATIENT_CLINIC_OR_DEPARTMENT_OTHER): Payer: Self-pay | Admitting: *Deleted

## 2019-10-25 ENCOUNTER — Other Ambulatory Visit: Payer: Self-pay

## 2019-10-25 ENCOUNTER — Encounter: Payer: Self-pay | Admitting: Obstetrics & Gynecology

## 2019-10-25 VITALS — BP 128/70

## 2019-10-25 DIAGNOSIS — M76821 Posterior tibial tendinitis, right leg: Secondary | ICD-10-CM | POA: Diagnosis not present

## 2019-10-25 DIAGNOSIS — N83201 Unspecified ovarian cyst, right side: Secondary | ICD-10-CM

## 2019-10-25 DIAGNOSIS — Z853 Personal history of malignant neoplasm of breast: Secondary | ICD-10-CM | POA: Diagnosis not present

## 2019-10-25 DIAGNOSIS — M2011 Hallux valgus (acquired), right foot: Secondary | ICD-10-CM | POA: Diagnosis not present

## 2019-10-25 DIAGNOSIS — M2012 Hallux valgus (acquired), left foot: Secondary | ICD-10-CM | POA: Diagnosis not present

## 2019-10-25 MED ORDER — ENSURE PRE-SURGERY PO LIQD
296.0000 mL | Freq: Once | ORAL | Status: DC
  Filled 2019-10-25: qty 296

## 2019-10-25 NOTE — Progress Notes (Addendum)
Spoke w/ via phone for pre-op interview---10-27-2019 Lab needs dos----  Cbc, type and screen            Lab results------ COVID test ------10-24-2019 Arrive at -------1100 NPO after ------midnight food, clear liquids until  1000 am drink ensure at 1000 am then npo Medications to take morning of surgery -----hydroxyazine, atenolol, levothyroxine, pantaprazole, famotidine Diabetic medication -----n/a Patient Special Instructions ----- Pre-Op special Istructions ----- Patient verbalized understanding of instructions that were given at this phone interview. Patient denies shortness of breath, chest pain, fever, cough a this phone interview.

## 2019-10-25 NOTE — Progress Notes (Signed)
    Jennifer [name] Jan 23, 1956 [id number]        63 y.o. G0  RP: Preop LPS BSO/Peritoneal washings  HPI: Incidental finding of a Rt Ovarian Cyst on CT scan.  No PMB.  No pelvic pain.  H/O Rt Breast invasive Ductal Ca grade 1 with ER positive.  Rt breast lumpectomy 09/2018 with Radioactive seed.  On Letrozole 2.5 mg daily currently.  Pending genetic testing.    OB History  Gravida Para Term Preterm AB Living  0 0 0 0 0 0  SAB TAB Ectopic Multiple Live Births  0 0 0 0 0    Past medical history,surgical history, problem list, medications, allergies, family history and social history were all reviewed and documented in the EPIC chart.   Directed ROS with pertinent positives and negatives documented in the history of present illness/assessment and plan.  Exam:  Vitals:   10/25/19 1500  BP: 128/70   General appearance:  Normal  Gyn exam: Deferred  Pelvic US 10/05/2019:  Ultrasound transvaginal shows uterus overall normal size with 4 measured myomas the largest 2.48 cm. Endometrial echo 2.15 mm. Left ovary small and atrophic in appearance. Right ovary with a 4.5 x 3.8 x 2.8 cm (37 mm mean) thin-walled avascular simple cyst. Small amount of free fluid noted in the cul-de-sac.   Assessment and Plan:  1. Cyst of right ovary Incidental finding of a simple right ovarian cyst.  The cyst is measured at 4.5 cm on pelvic ultrasound October 05, 2019.  At that time a Ca125 was done which was normal at 23.  Given patient's history of right breast invasive ductal cancer with estrogen receptors positive on letrozole, she feels more comfortable proceeding with a bilateral salpingo-oophorectomy.  Genetic screening pending.  Decision to proceed with a LPS BSO with peritoneal washings. Surgery and risks thoroughly discussed, preop and postop precautions reviewed.  Patient voiced understanding and agreement with plan.    2. History of breast cancer Invasive ductal cancer of the right breast, estrogen  receptors positive.  Status post right lumpectomy in November 2019.  A radioactive seed was put in place at that time.  Patient is currently on letrozole 2.5 mg daily.                        Patient was counseled as to the risk of surgery to include the following:  1. Infection (prohylactic antibiotics will be administered)  2. DVT/Pulmonary Embolism (prophylactic pneumo compression stockings will be used)  3.Trauma to internal organs requiring additional surgical procedure to repair any injury to internal organs requiring perhaps additional hospitalization days.  4.Hemmorhage requiring transfusion and blood products which carry risks such as anaphylactic reaction, hepatitis and AIDS  Patient had received literature information on the procedure scheduled and all her questions were answered and fully accepts all risks.  Counseling on above issues and coordination of care >50% x 15 minutes.  Princess Bruins MD, 3:43 PM 10/25/2019

## 2019-10-26 LAB — NOVEL CORONAVIRUS, NAA (HOSP ORDER, SEND-OUT TO REF LAB; TAT 18-24 HRS): SARS-CoV-2, NAA: NOT DETECTED

## 2019-10-27 ENCOUNTER — Ambulatory Visit (HOSPITAL_BASED_OUTPATIENT_CLINIC_OR_DEPARTMENT_OTHER): Payer: BC Managed Care – PPO | Admitting: Anesthesiology

## 2019-10-27 ENCOUNTER — Encounter (HOSPITAL_BASED_OUTPATIENT_CLINIC_OR_DEPARTMENT_OTHER): Payer: Self-pay | Admitting: Anesthesiology

## 2019-10-27 ENCOUNTER — Ambulatory Visit (HOSPITAL_BASED_OUTPATIENT_CLINIC_OR_DEPARTMENT_OTHER)
Admission: RE | Admit: 2019-10-27 | Discharge: 2019-10-27 | Disposition: A | Discharge status: Home / Self Care | Payer: BC Managed Care – PPO | Attending: Obstetrics & Gynecology | Admitting: Obstetrics & Gynecology

## 2019-10-27 ENCOUNTER — Other Ambulatory Visit: Payer: Self-pay

## 2019-10-27 ENCOUNTER — Encounter (HOSPITAL_BASED_OUTPATIENT_CLINIC_OR_DEPARTMENT_OTHER)
Admission: RE | Disposition: A | Discharge status: Home / Self Care | Payer: Self-pay | Source: Home / Self Care | Attending: Obstetrics & Gynecology

## 2019-10-27 ENCOUNTER — Encounter: Payer: Self-pay | Admitting: Obstetrics & Gynecology

## 2019-10-27 DIAGNOSIS — K219 Gastro-esophageal reflux disease without esophagitis: Secondary | ICD-10-CM | POA: Insufficient documentation

## 2019-10-27 DIAGNOSIS — Z853 Personal history of malignant neoplasm of breast: Secondary | ICD-10-CM | POA: Diagnosis not present

## 2019-10-27 DIAGNOSIS — R7303 Prediabetes: Secondary | ICD-10-CM | POA: Insufficient documentation

## 2019-10-27 DIAGNOSIS — Z888 Allergy status to other drugs, medicaments and biological substances status: Secondary | ICD-10-CM | POA: Diagnosis not present

## 2019-10-27 DIAGNOSIS — Z79890 Hormone replacement therapy (postmenopausal): Secondary | ICD-10-CM | POA: Diagnosis not present

## 2019-10-27 DIAGNOSIS — Z882 Allergy status to sulfonamides status: Secondary | ICD-10-CM | POA: Insufficient documentation

## 2019-10-27 DIAGNOSIS — M170 Bilateral primary osteoarthritis of knee: Secondary | ICD-10-CM | POA: Insufficient documentation

## 2019-10-27 DIAGNOSIS — Z79899 Other long term (current) drug therapy: Secondary | ICD-10-CM | POA: Insufficient documentation

## 2019-10-27 DIAGNOSIS — F419 Anxiety disorder, unspecified: Secondary | ICD-10-CM | POA: Insufficient documentation

## 2019-10-27 DIAGNOSIS — N83202 Unspecified ovarian cyst, left side: Secondary | ICD-10-CM | POA: Diagnosis not present

## 2019-10-27 DIAGNOSIS — G43909 Migraine, unspecified, not intractable, without status migrainosus: Secondary | ICD-10-CM | POA: Insufficient documentation

## 2019-10-27 DIAGNOSIS — Z885 Allergy status to narcotic agent status: Secondary | ICD-10-CM | POA: Insufficient documentation

## 2019-10-27 DIAGNOSIS — Z170 Estrogen receptor positive status [ER+]: Secondary | ICD-10-CM | POA: Diagnosis not present

## 2019-10-27 DIAGNOSIS — J309 Allergic rhinitis, unspecified: Secondary | ICD-10-CM | POA: Diagnosis not present

## 2019-10-27 DIAGNOSIS — D270 Benign neoplasm of right ovary: Secondary | ICD-10-CM | POA: Insufficient documentation

## 2019-10-27 DIAGNOSIS — Z79811 Long term (current) use of aromatase inhibitors: Secondary | ICD-10-CM | POA: Diagnosis not present

## 2019-10-27 DIAGNOSIS — Z803 Family history of malignant neoplasm of breast: Secondary | ICD-10-CM | POA: Insufficient documentation

## 2019-10-27 DIAGNOSIS — N83201 Unspecified ovarian cyst, right side: Secondary | ICD-10-CM | POA: Diagnosis not present

## 2019-10-27 DIAGNOSIS — M19041 Primary osteoarthritis, right hand: Secondary | ICD-10-CM | POA: Diagnosis not present

## 2019-10-27 DIAGNOSIS — Z881 Allergy status to other antibiotic agents status: Secondary | ICD-10-CM | POA: Diagnosis not present

## 2019-10-27 DIAGNOSIS — E039 Hypothyroidism, unspecified: Secondary | ICD-10-CM | POA: Diagnosis not present

## 2019-10-27 DIAGNOSIS — M19042 Primary osteoarthritis, left hand: Secondary | ICD-10-CM | POA: Diagnosis not present

## 2019-10-27 HISTORY — PX: LAPAROSCOPIC BILATERAL SALPINGO OOPHERECTOMY: SHX5890

## 2019-10-27 HISTORY — DX: Family history of other specified conditions: Z84.89

## 2019-10-27 LAB — CBC
HCT: 39.1 % (ref 36.0–46.0)
Hemoglobin: 12.4 g/dL (ref 12.0–15.0)
MCH: 27.2 pg (ref 26.0–34.0)
MCHC: 31.7 g/dL (ref 30.0–36.0)
MCV: 85.7 fL (ref 80.0–100.0)
Platelets: 282 10*3/uL (ref 150–400)
RBC: 4.56 MIL/uL (ref 3.87–5.11)
RDW: 13.7 % (ref 11.5–15.5)
WBC: 5.3 10*3/uL (ref 4.0–10.5)
nRBC: 0 % (ref 0.0–0.2)

## 2019-10-27 LAB — TYPE AND SCREEN
ABO/RH(D): B POS
Antibody Screen: NEGATIVE

## 2019-10-27 LAB — ABO/RH: ABO/RH(D): B POS

## 2019-10-27 SURGERY — SALPINGO-OOPHORECTOMY, BILATERAL, LAPAROSCOPIC
Anesthesia: General | Site: Abdomen | Laterality: Bilateral

## 2019-10-27 MED ORDER — ONDANSETRON HCL 4 MG/2ML IJ SOLN
INTRAMUSCULAR | Status: AC
  Filled 2019-10-27: qty 2

## 2019-10-27 MED ORDER — MIDAZOLAM HCL 5 MG/5ML IJ SOLN
INTRAMUSCULAR | Status: DC | PRN
  Administered 2019-10-27: 2 mg via INTRAVENOUS

## 2019-10-27 MED ORDER — IBUPROFEN 600 MG PO TABS: 600.0000 mg | ORAL_TABLET | Freq: Four times a day (QID) | ORAL | 0 refills | Status: DC | PRN

## 2019-10-27 MED ORDER — KETOROLAC TROMETHAMINE 30 MG/ML IJ SOLN
INTRAMUSCULAR | Status: DC | PRN
  Administered 2019-10-27: 30 mg via INTRAVENOUS

## 2019-10-27 MED ORDER — SUGAMMADEX SODIUM 200 MG/2ML IV SOLN
INTRAVENOUS | Status: DC | PRN
  Administered 2019-10-27: 25 mg via INTRAVENOUS
  Administered 2019-10-27: 200 mg via INTRAVENOUS

## 2019-10-27 MED ORDER — FENTANYL CITRATE (PF) 100 MCG/2ML IJ SOLN
INTRAMUSCULAR | Status: AC
  Filled 2019-10-27: qty 2

## 2019-10-27 MED ORDER — ACETAMINOPHEN 500 MG PO TABS
1000.0000 mg | ORAL_TABLET | Freq: Once | ORAL | Status: AC
  Administered 2019-10-27: 1000 mg via ORAL
  Filled 2019-10-27: qty 2

## 2019-10-27 MED ORDER — DEXAMETHASONE SODIUM PHOSPHATE 10 MG/ML IJ SOLN
INTRAMUSCULAR | Status: AC
  Filled 2019-10-27: qty 1

## 2019-10-27 MED ORDER — SILVER NITRATE-POT NITRATE 75-25 % EX MISC
CUTANEOUS | Status: DC | PRN
  Administered 2019-10-27: 3

## 2019-10-27 MED ORDER — CEFAZOLIN SODIUM-DEXTROSE 2-4 GM/100ML-% IV SOLN
INTRAVENOUS | Status: AC
  Filled 2019-10-27: qty 100

## 2019-10-27 MED ORDER — PROPOFOL 10 MG/ML IV BOLUS
INTRAVENOUS | Status: AC
  Filled 2019-10-27: qty 20

## 2019-10-27 MED ORDER — EPHEDRINE SULFATE 50 MG/ML IJ SOLN
INTRAMUSCULAR | Status: DC | PRN
  Administered 2019-10-27: 20 mg via INTRAVENOUS

## 2019-10-27 MED ORDER — LIDOCAINE HCL (CARDIAC) PF 100 MG/5ML IV SOSY
PREFILLED_SYRINGE | INTRAVENOUS | Status: DC | PRN
  Administered 2019-10-27: 40 mg via INTRAVENOUS

## 2019-10-27 MED ORDER — MIDAZOLAM HCL 2 MG/2ML IJ SOLN
INTRAMUSCULAR | Status: AC
  Filled 2019-10-27: qty 2

## 2019-10-27 MED ORDER — ACETAMINOPHEN 500 MG PO TABS
ORAL_TABLET | ORAL | Status: AC
  Filled 2019-10-27: qty 2

## 2019-10-27 MED ORDER — ONDANSETRON HCL 4 MG/2ML IJ SOLN
INTRAMUSCULAR | Status: DC | PRN
  Administered 2019-10-27: 4 mg via INTRAVENOUS

## 2019-10-27 MED ORDER — ROCURONIUM BROMIDE 100 MG/10ML IV SOLN
INTRAVENOUS | Status: DC | PRN
  Administered 2019-10-27: 50 mg via INTRAVENOUS
  Administered 2019-10-27: 10 mg via INTRAVENOUS

## 2019-10-27 MED ORDER — DEXAMETHASONE SODIUM PHOSPHATE 4 MG/ML IJ SOLN
INTRAMUSCULAR | Status: DC | PRN
  Administered 2019-10-27: 10 mg via INTRAVENOUS

## 2019-10-27 MED ORDER — LACTATED RINGERS IV SOLN
INTRAVENOUS | Status: DC
  Administered 2019-10-27: 12:00:00 via INTRAVENOUS
  Filled 2019-10-27: qty 1000

## 2019-10-27 MED ORDER — ROCURONIUM BROMIDE 10 MG/ML (PF) SYRINGE
PREFILLED_SYRINGE | INTRAVENOUS | Status: AC
  Filled 2019-10-27: qty 10

## 2019-10-27 MED ORDER — CEFAZOLIN SODIUM-DEXTROSE 2-4 GM/100ML-% IV SOLN
2.0000 g | INTRAVENOUS | Status: AC
  Administered 2019-10-27: 2 g via INTRAVENOUS
  Filled 2019-10-27: qty 100

## 2019-10-27 MED ORDER — FENTANYL CITRATE (PF) 100 MCG/2ML IJ SOLN
INTRAMUSCULAR | Status: DC | PRN
  Administered 2019-10-27: 100 ug via INTRAVENOUS
  Administered 2019-10-27 (×2): 25 ug via INTRAVENOUS
  Administered 2019-10-27: 50 ug via INTRAVENOUS

## 2019-10-27 MED ORDER — LIDOCAINE 2% (20 MG/ML) 5 ML SYRINGE
INTRAMUSCULAR | Status: AC
  Filled 2019-10-27: qty 5

## 2019-10-27 MED ORDER — LACTATED RINGERS IV SOLN
INTRAVENOUS | Status: DC
  Administered 2019-10-27 (×2): via INTRAVENOUS
  Filled 2019-10-27: qty 1000

## 2019-10-27 MED ORDER — TRAMADOL HCL 50 MG PO TABS: 50.0000 mg | ORAL_TABLET | Freq: Four times a day (QID) | ORAL | 0 refills | Status: DC | PRN

## 2019-10-27 MED ORDER — SODIUM CHLORIDE 0.9 % IR SOLN
Status: DC | PRN
  Administered 2019-10-27: 3000 mL

## 2019-10-27 MED ORDER — PROPOFOL 10 MG/ML IV BOLUS
INTRAVENOUS | Status: DC | PRN
  Administered 2019-10-27: 100 mg via INTRAVENOUS

## 2019-10-27 MED ORDER — BUPIVACAINE HCL (PF) 0.25 % IJ SOLN
INTRAMUSCULAR | Status: DC | PRN
  Administered 2019-10-27: 9 mL

## 2019-10-27 SURGICAL SUPPLY — 36 items
ADH SKN CLS APL DERMABOND .7 (GAUZE/BANDAGES/DRESSINGS) ×1
BAG SPEC RTRVL LRG 6X4 10 (ENDOMECHANICALS) ×1
CONT SPEC 4OZ CLIKSEAL STRL BL (MISCELLANEOUS) ×2 IMPLANT
COVER MAYO STAND STRL (DRAPES) ×2 IMPLANT
COVER WAND RF STERILE (DRAPES) ×2 IMPLANT
DERMABOND ADVANCED (GAUZE/BANDAGES/DRESSINGS) ×1
DERMABOND ADVANCED .7 DNX12 (GAUZE/BANDAGES/DRESSINGS) ×1 IMPLANT
DILATOR CANAL MILEX (MISCELLANEOUS) ×1 IMPLANT
DRSG OPSITE POSTOP 3X4 (GAUZE/BANDAGES/DRESSINGS) ×2 IMPLANT
DURAPREP 26ML APPLICATOR (WOUND CARE) ×2 IMPLANT
GAUZE 4X4 16PLY RFD (DISPOSABLE) ×2 IMPLANT
GLOVE BIO SURGEON STRL SZ 6.5 (GLOVE) ×2 IMPLANT
GLOVE BIO SURGEON STRL SZ7.5 (GLOVE) ×1 IMPLANT
GLOVE BIOGEL PI IND STRL 7.0 (GLOVE) ×2 IMPLANT
GLOVE BIOGEL PI INDICATOR 7.0 (GLOVE) ×3
GLOVE NEODERM STER SZ 7 (GLOVE) ×1 IMPLANT
GOWN STRL REUS W/TWL LRG LVL3 (GOWN DISPOSABLE) ×4 IMPLANT
GOWN STRL REUS W/TWL XL LVL3 (GOWN DISPOSABLE) ×1 IMPLANT
LEGGING LITHOTOMY PAIR STRL (DRAPES) ×1 IMPLANT
PACK LAPAROSCOPY BASIN (CUSTOM PROCEDURE TRAY) ×2 IMPLANT
PACK TRENDGUARD 450 HYBRID PRO (MISCELLANEOUS) ×1 IMPLANT
PAD OB MATERNITY 4.3X12.25 (PERSONAL CARE ITEMS) ×2 IMPLANT
POUCH SPECIMEN RETRIEVAL 10MM (ENDOMECHANICALS) ×1 IMPLANT
PROTECTOR NERVE ULNAR (MISCELLANEOUS) ×4 IMPLANT
SET IRRIG TUBING LAPAROSCOPIC (IRRIGATION / IRRIGATOR) ×2 IMPLANT
SET TUBE SMOKE EVAC HIGH FLOW (TUBING) ×2 IMPLANT
SHEARS HARMONIC ACE PLUS 36CM (ENDOMECHANICALS) ×2 IMPLANT
SOLUTION ELECTROLUBE (MISCELLANEOUS) ×1 IMPLANT
SUT MNCRL AB 4-0 PS2 18 (SUTURE) ×5 IMPLANT
SUT VICRYL 0 UR6 27IN ABS (SUTURE) ×2 IMPLANT
TOWEL OR 17X26 10 PK STRL BLUE (TOWEL DISPOSABLE) ×4 IMPLANT
TRAY FOLEY W/BAG SLVR 14FR (SET/KITS/TRAYS/PACK) ×2 IMPLANT
TRENDGUARD 450 HYBRID PRO PACK (MISCELLANEOUS) ×2
TROCAR BALLN 12MMX100 BLUNT (TROCAR) ×3 IMPLANT
TROCAR BLADELESS OPT 5 100 (ENDOMECHANICALS) ×4 IMPLANT
WARMER LAPAROSCOPE (MISCELLANEOUS) ×2 IMPLANT

## 2019-10-27 NOTE — Patient Instructions (Signed)
1. Cyst of right ovary Incidental finding of a simple right ovarian cyst.  The cyst is measured at 4.5 cm on pelvic ultrasound October 05, 2019.  At that time a Ca125 was done which was normal at 23.  Given patient's history of right breast invasive ductal cancer with estrogen receptors positive on letrozole, she feels more comfortable proceeding with a bilateral salpingo-oophorectomy.  Genetic screening pending.  Decision to proceed with a LPS BSO with peritoneal washings. Surgery and risks thoroughly discussed, preop and postop precautions reviewed.  Patient voiced understanding and agreement with plan.    2. History of breast cancer Invasive ductal cancer of the right breast, estrogen receptors positive.  Status post right lumpectomy in November 2019.  A radioactive seed was put in place at that time.  Patient is currently on letrozole 2.5 mg daily.                        Patient was counseled as to the risk of surgery to include the following:  1. Infection (prohylactic antibiotics will be administered)  2. DVT/Pulmonary Embolism (prophylactic pneumo compression stockings will be used)  3.Trauma to internal organs requiring additional surgical procedure to repair any injury to internal organs requiring perhaps additional hospitalization days.  4.Hemmorhage requiring transfusion and blood products which carry risks such as anaphylactic reaction, hepatitis and AIDS  Patient had received literature information on the procedure scheduled and all her questions were answered and fully accepts all risks.  Amandajo, it was a pleasure seeing you today!

## 2019-10-27 NOTE — Op Note (Signed)
Operative Note  10/27/2019  2:14 PM  PATIENT:  Jennifer [name]  63 y.o. female  PRE-OPERATIVE DIAGNOSIS:  Right ovarian cyst, personal history of breast cancer  POST-OPERATIVE DIAGNOSIS:  Right ovarian cyst, personal history of breast cancer  PROCEDURE:  Procedure(s): LAPAROSCOPIC BILATERAL SALPINGO-OOPHORECTOMY WITH PERITONEAL WASHINGS  SURGEON:  Surgeon(s): Princess Bruins, MD Fontaine, Belinda Block, MD  ANESTHESIA:   general  FINDINGS: Uterus normal. Bilateral tubes normal.  Right simple ovarian cyst.  Left ovary normal.  DESCRIPTION OF OPERATION: Under general anesthesia with endotracheal intubation the patient is in lithotomy position.  She is prepped with DuraPrep on the abdomen, Betadine on the suprapubic, vulvar and vaginal areas.  The Foley is inserted in the bladder.  The vaginal exam reveals an anteverted uterus, normal in size and mobile.  No adnexal mass felt.  The cervix is grasped with a tenaculum at the anterior lip, the cervix is dilated with an os finder.  The uterine cannula is inserted at that level.  We go to the abdomen.      The infraumbilical area is infiltrated with Marcaine one quarter plain.  A 1.5 cm incision is made with the scalpel.  The aponeurosis is grasped with Cokers.  The aponeurosis is open with Mayo scissors.  The parietal peritoneum is opened bluntly with the finger.  A pursestring stitch of Vicryl 0 is done on the aponeurosis.  The Hossein is inserted at that level and up pneumoperitoneum is created with CO2.  The camera is inserted in the [place]in.  Inspection of the abdomen is normal.  Inspection of the pelvis reveals a normal uterus in size and appearance.  Normal bilateral tubes.  The left ovary is normal in size and appearance.  A 4.5cm cyst is present on the right ovary.  No other lesion is present in the pelvis.  The skin is infiltrated with Marcaine one quarter plain at the right lower abdomen and left lower abdomen.  A 5 mm incision is done with the  scalpel at those locations.  The 5 mm ports are inserted under direct vision on either side.  The patient is put in Trendelenburg.  Both ureters are seen in normal anatomic position with good peristalsis.  Pictures were taken of the liver with gallbladder, the appendix, the uterus and bilateral tubes and ovaries.  Peritoneal washings were done using the Nezhat.  The harmonic scalpel is used to cauterize and section the right infundibulopelvic ligament.  We continue with the harmonic scalpel just below the right ovary cauterizing and sectioning.  We then cauterized and section the right utero-ovarian ligament.  We cauterized and section the right tube proximally, close to the uterus.  The specimen is completely detached and the cyst is intact.  The specimen is put in the posterior cul-de-sac.  We then proceeded the same way on the left side.  The left infundibulopelvic ligament is cauterized and sectioned with the harmonic scalpel.  We follow just under the left ovary with cauterization and sectioning with the harmonic scalpel.  The left utero-ovarian ligament is cauterized and section.  The left tube is cauterized and section close to the uterus.  The left tube and ovary are put in the posterior cul-de-sac.  We switched to the 5 mm camera.  A 10 cm Endobag is inserted in the abdominal pelvic cavities through the [facility] port.  The 2 specimens are put in the Endobag and removed from the abdominopelvic cavity.  The 2 specimens are sent together to pathology.  We  then put back in place the Garland. The pneumoperitoneum is recreated.  Pictures are taken of the bilateral adnexae.  Hemostasis is adequate at all levels.  We irrigated and suctioned the pelvic cavity.  Good peristalsis is seen on both ureters.  The laparoscopic instruments are removed.  We remove the ports under direct vision.  The CO2 was evacuated.  The pursestring stitch is attached at the aponeurosis.  We closed the skin with separate stitches of Monocryl  4-0.  Dermabond is added on the 3 incisions.  Honeycomb dressing is applied on the infraumbilical incision.  The vaginal instruments are removed.  Hemostasis is controlled on the anterior lip of the cervix with silver nitrate.  The Foley is removed from the bladder.  The patient is brought to recovery room in good and stable status.  ESTIMATED BLOOD LOSS: 5 mL   Intake/Output Summary (Last 24 hours) at 10/27/2019 1414 Last data filed at 10/27/2019 1406 Gross per 24 hour  Intake 1000 ml  Output 5 ml  Net 995 ml     BLOOD ADMINISTERED:none   LOCAL MEDICATIONS USED:  MARCAINE     SPECIMEN:  Source of Specimen:  Bilateral Ovaries and Tubes.  Peritoneal washings  DISPOSITION OF SPECIMEN:  PATHOLOGY  COUNTS:  YES  PLAN OF CARE: Transfer to PACU  Marie-Lyne LavoieMD2:14 PM

## 2019-10-27 NOTE — H&P (Signed)
Jennifer [name] is an 63 y.o. female. G0  RP: LPS BSO/Peritoneal washings  HPI: Incidental finding of a Rt Ovarian Cyst on CT scan.  No PMB.  No pelvic pain.  H/O Rt Breast invasive Ductal Ca grade 1 with ER positive.  Rt breast lumpectomy 09/2018 with Radioactive seed.  On Letrozole 2.5 mg daily currently.  Pending genetic testing.   Pertinent Gynecological History: Menses: post-menopausal Contraception: post menopausal status Blood transfusions: none Sexually transmitted diseases: no past history  Last mammogram: normal  Last pap: normal  OB History: G0   Menstrual History: No LMP recorded. Patient is postmenopausal.    Past Medical History:  Diagnosis Date  . Allergic rhinitis   . Anxiety   . Arthritis    hands, knees  . Breast cancer ([place]) 2019   Right Breast Cancer  . Cancer ([place]) 09/2018   Breast CA new DX, right breast  . Cervical dysplasia   . Endometriosis   . Esophageal reflux   . Family history of adverse reaction to anesthesia    younger sister anxiety for a dew days after  . Family history of breast cancer   . Family history of breast cancer   . Family history of prostate cancer   . Hypothyroid   . Migraine with aura, without mention of intractable migraine without mention of status migrainosus   . Osteopenia 08/2019   T score -2.2 FRAX 11% / 1.7%  . Personal history of radiation therapy 2019   Right Breast Cancer  . Pre-diabetes     Past Surgical History:  Procedure Laterality Date  . BREAST EXCISIONAL BIOPSY Bilateral over 10 years ago   benign x 2   . BREAST LUMPECTOMY Right 10/10/2018  . BREAST LUMPECTOMY WITH RADIOACTIVE SEED AND SENTINEL LYMPH NODE BIOPSY Right 10/10/2018   Procedure: RIGHT BREAST LUMPECTOMY WITH RADIOACTIVE SEED AND RIGHT SENTINEL LYMPH NODE BIOPSY;  Surgeon: Jovita Kussmaul, MD;  Location: [facility];  Service: General;  Laterality: Right;  . BREAST SURGERY     Benign breast lump excised  . CERVICAL CONE BIOPSY   1985   severe dysplasia  . COLONOSCOPY  2005 and dec 2019   normal  . COLPOSCOPY    . endoscopy  10/2018  . moles removed from upper back, face lft, 1 between breasts, upper leg left inside  10/18/2019   wearing small round bandaids on for 2 weeks  . ROTATOR CUFF REPAIR Bilateral 08/2008    Family History  Problem Relation Age of Onset  . Breast cancer Mother 24  . Diabetes Mother   . Hypertension Father   . Heart disease Father   . Osteoporosis Maternal Grandmother   . Diabetes Maternal Grandmother   . Hypertension Sister   . COPD Paternal Grandmother   . Heart disease Paternal Grandfather   . Breast cancer Cousin 60       pat first cousin  . Breast cancer Cousin        mat second cousin with breast cancer in her 70s  . Prostate cancer Other 69  . Thyroid cancer Cousin 57       pat first cousin    Social History:  reports that she has never smoked. She has never used smokeless tobacco. She reports that she does not drink alcohol or use drugs.  Allergies:  Allergies  Allergen Reactions  . Ciprofloxacin Other (See Comments)    Dizziness   . Codeine Phosphate Other (See Comments)    Dizziness,  heart palpitations, nervous  . Decongestant [Pseudoephedrine] Other (See Comments)    dizziness  . Flonase [Fluticasone Propionate] Other (See Comments)    Worsens her migraine   . Hydrocodone Other (See Comments)    Made nervous--10/09 surgery rotator cuff  . Reglan [Metoclopramide] Other (See Comments)    Dizziness  . Topamax [Topiramate] Other (See Comments)    Dizziness   . Benadryl [Diphenhydramine Hcl] Palpitations  . Sulfa Antibiotics Anxiety    Medications Prior to Admission  Medication Sig Dispense Refill Last Dose  . ALPRAZolam (XANAX) 0.5 MG tablet TAKE 1/2 TO 1 TABLET BY MOUTH ONCE DAILY AS NEEDED FOR SLEEP/ANXIETY 15 tablet 0 10/26/2019 at Unknown time  . atenolol (TENORMIN) 25 MG tablet Take 12.5 mg by mouth 3 (three) times daily. For migraine prevention    10/27/2019 at 0930  . Calcium Carbonate-Vitamin D 600-400 MG-UNIT tablet Take 2 tablets by mouth daily. Takes 2 in am 1 in pm   10/26/2019 at Unknown time  . cyanocobalamin 2000 MCG tablet Take 2,000 mcg by mouth. 3 x week fri, sat, sunday   Past Week at Unknown time  . famotidine (PEPCID) 40 MG tablet Take 40 mg by mouth 2 (two) times daily.    10/27/2019 at 0945  . hydrOXYzine (ATARAX/VISTARIL) 25 MG tablet Take 1 tablet (25 mg total) by mouth 3 (three) times daily as needed. (Patient taking differently: Take 25 mg by mouth 2 (two) times daily. ) 30 tablet 0 10/27/2019 at 0945  . levocetirizine (XYZAL) 5 MG tablet Take 1 tablet by mouth every evening.  3 10/26/2019 at Unknown time  . levothyroxine (SYNTHROID) 75 MCG tablet TAKE ONE TABLET BY MOUTH ONE TIME DAILY  30 tablet 5 10/27/2019 at Unknown time  . Magnesium Oxide 400 (240 Mg) MG TABS Take by mouth.   10/26/2019 at Unknown time  . Multiple Vitamin (MULTIVITAMIN WITH MINERALS) TABS tablet Take 1 tablet by mouth daily.   10/26/2019 at Unknown time  . Omega-3 Fatty Acids (FISH OIL) 1000 MG CAPS Take 1,000 mg by mouth daily.    10/26/2019 at Unknown time  . ondansetron (ZOFRAN ODT) 4 MG disintegrating tablet Take 1 tablet (4 mg total) by mouth every 8 (eight) hours as needed. 15 tablet 0 Past Month at Unknown time  . pantoprazole (PROTONIX) 40 MG tablet TAKE ONE TABLET BY MOUTH ONE TIME DAILY  30 tablet 5 10/27/2019 at Unknown time  . Probiotic Product (PROBIOTIC-10 PO) Take 1 capsule by mouth daily.    Past Week at Unknown time  . TURMERIC PO Take 1 tablet by mouth daily.    10/26/2019 at Unknown time  . baclofen (LIORESAL) 10 MG tablet Take 10 mg by mouth daily as needed (migraines).    More than a month at Unknown time  . glucose blood (TRUE METRIX BLOOD GLUCOSE TEST) test strip Use to check blood sugar two times a day. 100 each 6   . ketorolac (TORADOL) 10 MG tablet Take 1 tablet (10 mg total) by mouth every 6 (six) hours as needed. (Patient taking  differently: Take 10 mg by mouth every 6 (six) hours as needed for moderate pain. ) 20 tablet 0 Unknown at Unknown time  . Lancets (ACCU-CHEK MULTICLIX) lancets Use to check blood sugar two times a day. 100 each 6   . letrozole (FEMARA) 2.5 MG tablet Take 1 tablet (2.5 mg total) by mouth daily. (Patient not taking: Reported on 10/25/2019) 90 tablet 3 Not Taking at Unknown time  .  Misc Natural Products (GLUCOSAMINE CHOND COMPLEX/MSM) TABS Take 1 tablet by mouth daily.    Unknown at Unknown time  . nabumetone (RELAFEN) 500 MG tablet Take 500 mg by mouth daily as needed (migraines).    Unknown at Unknown time  . OnabotulinumtoxinA (BOTOX IJ) Inject 1 Dose as directed every 6 (six) weeks.    More than a month at Unknown time  . promethazine (PHENERGAN) 25 MG tablet Take 12.5 mg by mouth daily as needed for nausea or vomiting. When having a migraine   Unknown at Unknown time  . rizatriptan (MAXALT) 10 MG tablet Take 1 tablet (10 mg total) by mouth once as needed for up to 10 days for migraine. May repeat in 2 hours if needed 10 tablet 0     REVIEW OF SYSTEMS: A ROS was performed and pertinent positives and negatives are included in the history.  GENERAL: No fevers or chills. HEENT: No change in vision, no earache, sore throat or sinus congestion. NECK: No pain or stiffness. CARDIOVASCULAR: No chest pain or pressure. No palpitations. PULMONARY: No shortness of breath, cough or wheeze. GASTROINTESTINAL: No abdominal pain, nausea, vomiting or diarrhea, melena or bright red blood per rectum. GENITOURINARY: No urinary frequency, urgency, hesitancy or dysuria. MUSCULOSKELETAL: No joint or muscle pain, no back pain, no recent trauma. DERMATOLOGIC: No rash, no itching, no lesions. ENDOCRINE: No polyuria, polydipsia, no heat or cold intolerance. No recent change in weight. HEMATOLOGICAL: No anemia or easy bruising or bleeding. NEUROLOGIC: No headache, seizures, numbness, tingling or weakness. PSYCHIATRIC: No depression,  no loss of interest in normal activity or change in sleep pattern.     Blood pressure 118/62, pulse 61, temperature 98 F (36.7 C), temperature source Oral, resp. rate 14, height 5\' 1"  (1.549 m), weight 58.6 kg, SpO2 100 %.  Physical Exam:  See office notes   Results for orders placed or performed during the hospital encounter of 10/27/19 (from the past 24 hour(s))  CBC     Status: None   Collection Time: 10/27/19 11:15 AM  Result Value Ref Range   WBC 5.3 4.0 - 10.5 K/uL   RBC 4.56 3.87 - 5.11 MIL/uL   Hemoglobin 12.4 12.0 - 15.0 g/dL   HCT 39.1 36.0 - 46.0 %   MCV 85.7 80.0 - 100.0 fL   MCH 27.2 26.0 - 34.0 pg   MCHC 31.7 30.0 - 36.0 g/dL   RDW 13.7 11.5 - 15.5 %   Platelets 282 150 - 400 K/uL   nRBC 0.0 0.0 - 0.2 %   Novel Coronavirus: Negative  Pelvic US 10/05/2019: Ultrasound transvaginal shows uterus overall normal size with 4 measured myomas the largest 2.48 cm. Endometrial echo 2.15 mm. Left ovary small and atrophic in appearance. Right ovary with a 4.5 x 3.8 x 2.8 cm (37 mm mean) thin-walled avascular simple cyst. Small amount of free fluid noted in the cul-de-sac.  Ca 125 normal at 23   Assessment/Plan:  63 y.o. G0P0000   1. Cyst of right ovary Incidental finding of a simple right ovarian cyst.  The cyst is measured at 4.5 cm on pelvic ultrasound October 05, 2019.  At that time a Ca125 was done which was normal at 23.  Given patient's history of right breast invasive ductal cancer with estrogen receptors positive on letrozole, she feels more comfortable proceeding with a bilateral salpingo-oophorectomy.  Genetic screening pending.  Decision to proceed with a LPS BSO with peritoneal washings. Surgery and risks thoroughly discussed, preop and postop  precautions reviewed.  Patient voiced understanding and agreement with plan.    2. History of breast cancer Invasive ductal cancer of the right breast, estrogen receptors positive.  Status post right lumpectomy in  November 2019.  A radioactive seed was put in place at that time.  Patient is currently on letrozole 2.5 mg daily.                        Patient was counseled as to the risk of surgery to include the following:  1. Infection (prohylactic antibiotics will be administered)  2. DVT/Pulmonary Embolism (prophylactic pneumo compression stockings will be used)  3.Trauma to internal organs requiring additional surgical procedure to repair any injury to internal organs requiring perhaps additional hospitalization days.  4.Hemmorhage requiring transfusion and blood products which carry risks such as anaphylactic reaction, hepatitis and AIDS  Patient had received literature information on the procedure scheduled and all her questions were answered and fully accepts all risks.   Marie-Lyne Charman Blasco 10/27/2019, 12:46 PM

## 2019-10-27 NOTE — Anesthesia Preprocedure Evaluation (Addendum)
Anesthesia Evaluation  Patient identified by MRN, date of birth, ID band Patient awake    Reviewed: Allergy & Precautions, NPO status , Patient's Chart, lab work & pertinent test results, reviewed documented beta blocker date and time   History of Anesthesia Complications Negative for: history of anesthetic complications  Airway Mallampati: II  TM Distance: >3 FB Neck ROM: Full    Dental  (+) Teeth Intact, Dental Advisory Given   Pulmonary neg pulmonary ROS,    Pulmonary exam normal breath sounds clear to auscultation       Cardiovascular negative cardio ROS Normal cardiovascular exam Rhythm:Regular Rate:Normal     Neuro/Psych  Headaches, PSYCHIATRIC DISORDERS Anxiety    GI/Hepatic Neg liver ROS, GERD  Medicated and Controlled,  Endo/Other  Hypothyroidism   Renal/GU negative Renal ROS   Right ovarian cyst, endometriosis, cervical dysplasia    Musculoskeletal  (+) Arthritis , Osteoarthritis,    Abdominal Normal abdominal exam  (+)   Peds  Hematology negative hematology ROS (+)   Anesthesia Other Findings   Reproductive/Obstetrics Hx right breast ca s/p lumpectomy, radiation 2019                             Anesthesia Physical  Anesthesia Plan  ASA: II  Anesthesia Plan: General   Post-op Pain Management:    Induction: Intravenous  PONV Risk Score and Plan: 3 and Midazolam, Dexamethasone, Ondansetron and Treatment may vary due to age or medical condition  Airway Management Planned: Oral ETT  Additional Equipment: None  Intra-op Plan:   Post-operative Plan: Extubation in OR  Informed Consent: I have reviewed the patients History and Physical, chart, labs and discussed the procedure including the risks, benefits and alternatives for the proposed anesthesia with the patient or authorized representative who has indicated his/her understanding and acceptance.     Dental  advisory given  Plan Discussed with: CRNA  Anesthesia Plan Comments:         Anesthesia Quick Evaluation

## 2019-10-27 NOTE — Anesthesia Postprocedure Evaluation (Signed)
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  Procedure(s) Performed: LAPAROSCOPIC BILATERAL SALPINGO OOPHORECTOMY WITH PERITONEAL WASHINGS (Bilateral Abdomen)     Patient location during evaluation: PACU Anesthesia Type: General Level of consciousness: awake and alert, oriented and patient cooperative Pain management: pain level controlled Vital Signs Assessment: post-procedure vital signs reviewed and stable Respiratory status: spontaneous breathing, nonlabored ventilation and respiratory function stable Cardiovascular status: blood pressure returned to baseline and stable Postop Assessment: no apparent nausea or vomiting Anesthetic complications: no    Last Vitals:  Vitals:   10/27/19 1445 10/27/19 1500  BP: 124/60 117/65  Pulse: 79 65  Resp: 15 13  Temp:    SpO2: 100% 100%    Last Pain:  Vitals:   10/27/19 1500  TempSrc:   PainSc: 0-No pain                 Jarome Matin Nayshawn Mesta

## 2019-10-27 NOTE — Discharge Instructions (Addendum)
Bilateral Salpingo-Oophorectomy, Care After This sheet gives you information about how to care for yourself after your procedure. Your health care provider may also give you more specific instructions. If you have problems or questions, contact your health care provider. What can I expect after the procedure? After the procedure, it is common to have:  Abdominal pain.  Some occasional vaginal bleeding (spotting).  Tiredness.  Symptoms of menopause, such as hot flashes, night sweats, or mood swings. Follow these instructions at home: Incision care   Keep your incision area and your bandage (dressing) clean and dry.  Follow instructions from your health care provider about how to take care of your incision. Make sure you: ? Wash your hands with soap and water before you change your dressing. If soap and water are not available, use hand sanitizer. ? Change your dressing as told by your health care provider. ? Leave stitches (sutures), staples, skin glue, or adhesive strips in place. These skin closures may need to stay in place for 2 weeks or longer. If adhesive strip edges start to loosen and curl up, you may trim the loose edges. Do not remove adhesive strips completely unless your health care provider tells you to do that.  Check your incision area every day for signs of infection. Check for: ? Redness, swelling, or pain. ? Fluid or blood. ? Warmth. ? Pus or a bad smell. Activity   Do not drive or use heavy machinery while taking prescription pain medicine.  Do not drive for 24 hours if you received a medicine to help you relax (sedative) during your procedure.  Take frequent, short walks throughout the day. Rest when you get tired. Ask your health care provider what activities are safe for you.  Avoid activity that requires great effort. Also, avoid heavy lifting. Do not lift anything that is heavier than 10 lbs. (4.5 kg), or the limit that your health care provider tells you,  until he or she says that it is safe to do so.  Do not douche, use tampons, or have sex until your health care provider approves. General instructions   To prevent or treat constipation while you are taking prescription pain medicine, your health care provider may recommend that you: ? Drink enough fluid to keep your urine clear or pale yellow. ? Take over-the-counter or prescription medicines. ? Eat foods that are high in fiber, such as fresh fruits and vegetables, whole grains, and beans. ? Limit foods that are high in fat and processed sugars, such as fried and sweet foods.  Take over-the-counter and prescription medicines only as told by your health care provider.  Do not take baths, swim, or use a hot tub until your health care provider approves. Ask your health care provider if you can take showers. You may only be allowed to take sponge baths for bathing.  Wear compression stockings as told by your health care provider. These stockings help to prevent blood clots and reduce swelling in your legs.  Keep all follow-up visits as told by your health care provider. This is important. Contact a health care provider if:  You have pain when you urinate.  You have pus or a bad smelling discharge coming from your vagina.  You have redness, swelling, or pain around your incision.  You have fluid or blood coming from your incision.  Your incision feels warm to the touch.  You have pus or a bad smell coming from your incision.  You have a fever.  Your incision starts to break open.  You have pain in the abdomen, and it gets worse or does not get better when you take medicine.  You develop a rash.  You develop nausea and vomiting.  You feel lightheaded. Get help right away if:  You develop pain in your chest or leg.  You become short of breath.  You faint.  You have increased bleeding from your vagina. Summary  After the procedure, it is common to have pain, bleeding  in the vagina, and symptoms of menopause.  Follow instructions from your health care provider about how to take care of your incision.  Follow instructions from your health care provider about activities and restrictions.  Check your incision every day for signs of infection and report any symptoms to your health care provider. This information is not intended to replace advice given to you by your health care provider. Make sure you discuss any questions you have with your health care provider. Document Released: 11/09/2005 Document Revised: 01/13/2019 Document Reviewed: 12/14/2016 Elsevier Patient Education  [place] Instructions  Activity: Get plenty of rest for the remainder of the day. A responsible individual must stay with you for 24 hours following the procedure.  For the next 24 hours, DO NOT: -Drive a car -Paediatric nurse -Drink alcoholic beverages -Take any medication unless instructed by your physician -Make any legal decisions or sign important papers.  Meals: Start with liquid foods such as gelatin or soup. Progress to regular foods as tolerated. Avoid greasy, spicy, heavy foods. If nausea and/or vomiting occur, drink only clear liquids until the nausea and/or vomiting subsides. Call your physician if vomiting continues.  Special Instructions/Symptoms: Your throat may feel dry or sore from the anesthesia or the breathing tube placed in your throat during surgery. If this causes discomfort, gargle with warm salt water. The discomfort should disappear within 24 hours.

## 2019-10-27 NOTE — Transfer of Care (Signed)
Immediate Anesthesia Transfer of Care Note  Patient: Jennifer [name]  Procedure(s) Performed: Procedure(s) (LRB): LAPAROSCOPIC BILATERAL SALPINGO OOPHORECTOMY WITH PERITONEAL WASHINGS (Bilateral)  Patient Location: PACU  Anesthesia Type: General  Level of Consciousness: awake, sedated, patient cooperative and responds to stimulation  Airway & Oxygen Therapy: Patient Spontanous Breathing and Patient connected to [place] 02 and soft FM  Post-op Assessment: Report given to PACU RN, Post -op Vital signs reviewed and stable and Patient moving all extremities  Post vital signs: Reviewed and stable  Complications: No apparent anesthesia complications

## 2019-10-27 NOTE — Anesthesia Procedure Notes (Signed)
Procedure Name: Intubation Date/Time: 10/27/2019 1:04 PM Performed by: Justice Rocher, CRNA Pre-anesthesia Checklist: Patient identified, Emergency Drugs available, Suction available and Patient being monitored Patient Re-evaluated:Patient Re-evaluated prior to induction Oxygen Delivery Method: Circle system utilized Preoxygenation: Pre-oxygenation with 100% oxygen Induction Type: IV induction Ventilation: Mask ventilation without difficulty Laryngoscope Size: Mac and 3 Grade View: Grade II Tube type: Oral Tube size: 7.0 mm Number of attempts: 1 Airway Equipment and Method: Stylet and Oral airway Placement Confirmation: ETT inserted through vocal cords under direct vision,  positive ETCO2 and breath sounds checked- equal and bilateral Secured at: 23 cm Tube secured with: Tape Dental Injury: Teeth and Oropharynx as per pre-operative assessment

## 2019-10-30 ENCOUNTER — Telehealth: Payer: Self-pay | Admitting: *Deleted

## 2019-10-30 ENCOUNTER — Encounter (HOSPITAL_BASED_OUTPATIENT_CLINIC_OR_DEPARTMENT_OTHER): Payer: Self-pay | Admitting: Obstetrics & Gynecology

## 2019-10-30 LAB — CYTOLOGY - NON PAP

## 2019-10-30 LAB — SURGICAL PATHOLOGY

## 2019-10-30 NOTE — Telephone Encounter (Signed)
PATIENT CALLED POST-LAPAROSCOPIC BILATERAL SALPINGO OOPHORECTOMY WITH PERITONEAL WASHINGS ON 10/27/19. PATIENT CALLED WITH COUPLE OF QUESTIONS:  1. WHEN CAN THE PATCH ON THE NAVEL BE REMOVED, SHE THOUGHT YOU TOLD HER AFTER 3 DAYS, BUT WANTS TO CONFIRM? 2.NOTICED VAGINAL DISCHARGE [place] DISCHARGE ON PAD, ASKED HOW LONG SHE CAN EXPECT THIS?

## 2019-10-30 NOTE — Telephone Encounter (Signed)
Remove umbilical dressing now.  May have vaginal spotting x 1 week.

## 2019-10-31 ENCOUNTER — Ambulatory Visit (INDEPENDENT_AMBULATORY_CARE_PROVIDER_SITE_OTHER): Payer: BC Managed Care – PPO | Admitting: Psychology

## 2019-10-31 DIAGNOSIS — F411 Generalized anxiety disorder: Secondary | ICD-10-CM

## 2019-10-31 NOTE — Telephone Encounter (Signed)
Called patient and per DPR access note on file detailed message left with answers to her questions.

## 2019-11-01 NOTE — Telephone Encounter (Signed)
Spoke with patient this same day this was sent and informed her pathology benign.

## 2019-11-03 ENCOUNTER — Other Ambulatory Visit: Payer: Self-pay

## 2019-11-03 ENCOUNTER — Ambulatory Visit (INDEPENDENT_AMBULATORY_CARE_PROVIDER_SITE_OTHER): Payer: BC Managed Care – PPO | Admitting: Family Medicine

## 2019-11-03 ENCOUNTER — Encounter: Payer: Self-pay | Admitting: Family Medicine

## 2019-11-03 ENCOUNTER — Ambulatory Visit (INDEPENDENT_AMBULATORY_CARE_PROVIDER_SITE_OTHER)
Admission: RE | Admit: 2019-11-03 | Discharge: 2019-11-03 | Disposition: A | Discharge status: Home / Self Care | Payer: BC Managed Care – PPO | Source: Ambulatory Visit | Attending: Family Medicine | Admitting: Family Medicine

## 2019-11-03 DIAGNOSIS — M79642 Pain in left hand: Secondary | ICD-10-CM

## 2019-11-03 DIAGNOSIS — M79641 Pain in right hand: Secondary | ICD-10-CM | POA: Diagnosis not present

## 2019-11-03 NOTE — Progress Notes (Signed)
Subjective:    Patient ID: Jennifer [name], female    DOB: October 31, 1956, 63 y.o.   MRN: [id number]  This visit occurred during the SARS-CoV-2 public health emergency.  Safety protocols were in place, including screening questions prior to the visit, additional usage of staff PPE, and extensive cleaning of exam room while observing appropriate contact time as indicated for disinfecting solutions.    HPI Pt presents with hand discomfort   Wt Readings from Last 3 Encounters:  11/03/19 128 lb 7 oz (58.3 kg)  10/27/19 129 lb 4 oz (58.6 kg)  10/09/19 129 lb 12.8 oz (58.9 kg)   24.27 kg/m   Had her oophorectomy for ovarian cyst  Pathology is all negative  She is still tired  A little boated   Today she has hand discomfort  Feels like "inflammation"   On aromatase inhibitor-which makes joint pain worse  She was off of it for her surgery for 2 weeks  Now she dreads starting back on letrozole   She can tell her hand joints are swollen  Hard to get her rings off (left ring finger)  Thinks her joints are getting bigger  More inflammation   All of her joints hurt Does not like to take pain medicine Took ibuprofen after surgery (not taking now)   Has prn nambumetone as needed- very seldom takes for a headache    Has had rheumatoid tests in the past-negative   5/19-negative autoimmune joint labs   Xray today DG HANDS 1 VIEW BILAT BALLCATCHERS  Result Date: 11/03/2019 CLINICAL DATA:  Joint pain, worse at night, with some stiffness. EXAM: HAND LIMITED 1 VIEW COMPARISON:  None. FINDINGS: The study is limited as only a single view was obtained of each hand. Left hand: No fractures. No carpal crowding. No bony erosion. Evaluation of joint spaces in the fingers is limited due to positioning. Right hand: No carpal crowding or bony erosions. Evaluation of the interphalangeal joint spaces is limited due to positioning. No fractures or dislocations. IMPRESSION: The study is limited due to a  single image of each hand. The fingers appear partially flexed on today's study which limits evaluation of the interphalangeal joints. No definitive acute abnormalities are noted. Electronically Signed   By: Dorise Bullion III M.D   On: 11/03/2019 16:01     Patient Active Problem List   Diagnosis Date Noted  . Bilateral hand pain 11/03/2019  . Family history of prostate cancer   . Adnexal cyst 09/26/2019  . Cystitis with hematuria 09/26/2019  . Pain of left calf 03/21/2019  . Use of anastrozole (Arimidex) 03/21/2019  . Elevated transaminase level 01/03/2019  . Family history of breast cancer   . Stress reaction 09/26/2018  . Malignant neoplasm of upper-inner quadrant of right breast in female, estrogen receptor positive ([place]) 09/20/2018  . B12 deficiency 04/13/2018  . Joint pain 04/11/2018  . Skin pain 04/11/2018  . Paresthesia 04/11/2018  . Colon cancer screening 09/16/2017  . Prediabetes 07/28/2016  . Allergic rhinitis 11/13/2014  . Screening for lipoid disorders 09/26/2014  . Preventative health care 09/26/2014  . Routine general medical examination at a health care facility 10/28/2012  . Herpes labialis 04/28/2011  . GERD 09/16/2010  . Hypothyroidism 05/07/2008  . Osteopenia 05/07/2008  . Migraine with aura 11/11/2007   Past Medical History:  Diagnosis Date  . Allergic rhinitis   . Anxiety   . Arthritis    hands, knees  . Breast cancer ([place]) 2019   Right Breast  Cancer  . Cancer ([place]) 09/2018   Breast CA new DX, right breast  . Cervical dysplasia   . Endometriosis   . Esophageal reflux   . Family history of adverse reaction to anesthesia    younger sister anxiety for a dew days after  . Family history of breast cancer   . Family history of breast cancer   . Family history of prostate cancer   . Hypothyroid   . Migraine with aura, without mention of intractable migraine without mention of status migrainosus   . Osteopenia 08/2019   T score -2.2 FRAX 11% / 1.7%    . Personal history of radiation therapy 2019   Right Breast Cancer  . Pre-diabetes    Past Surgical History:  Procedure Laterality Date  . BREAST EXCISIONAL BIOPSY Bilateral over 10 years ago   benign x 2   . BREAST LUMPECTOMY Right 10/10/2018  . BREAST LUMPECTOMY WITH RADIOACTIVE SEED AND SENTINEL LYMPH NODE BIOPSY Right 10/10/2018   Procedure: RIGHT BREAST LUMPECTOMY WITH RADIOACTIVE SEED AND RIGHT SENTINEL LYMPH NODE BIOPSY;  Surgeon: Jovita Kussmaul, MD;  Location: [facility];  Service: General;  Laterality: Right;  . BREAST SURGERY     Benign breast lump excised  . CERVICAL CONE BIOPSY  1985   severe dysplasia  . COLONOSCOPY  2005 and dec 2019   normal  . COLPOSCOPY    . endoscopy  10/2018  . LAPAROSCOPIC BILATERAL SALPINGO OOPHERECTOMY Bilateral 10/27/2019   Procedure: LAPAROSCOPIC BILATERAL SALPINGO OOPHORECTOMY WITH PERITONEAL WASHINGS;  Surgeon: Princess Bruins, MD;  Location: [facility];  Service: Gynecology;  Laterality: Bilateral;  request 1:00pm on Friday, Dec. 4th in [place] time held requests one hour OR time  . moles removed from upper back, face lft, 1 between breasts, upper leg left inside  10/18/2019   wearing small round bandaids on for 2 weeks  . ROTATOR CUFF REPAIR Bilateral 08/2008   Social History   Tobacco Use  . Smoking status: Never Smoker  . Smokeless tobacco: Never Used  Substance Use Topics  . Alcohol use: No    Alcohol/week: 0.0 standard drinks  . Drug use: No   Family History  Problem Relation Age of Onset  . Breast cancer Mother 24  . Diabetes Mother   . Hypertension Father   . Heart disease Father   . Osteoporosis Maternal Grandmother   . Diabetes Maternal Grandmother   . Hypertension Sister   . COPD Paternal Grandmother   . Heart disease Paternal Grandfather   . Breast cancer Cousin 58       pat first cousin  . Breast cancer Cousin        mat second cousin with breast cancer in her 74s   . Prostate cancer Other 59  . Thyroid cancer Cousin 39       pat first cousin   Allergies  Allergen Reactions  . Ciprofloxacin Other (See Comments)    Dizziness   . Codeine Phosphate Other (See Comments)    Dizziness, heart palpitations, nervous  . Decongestant [Pseudoephedrine] Other (See Comments)    dizziness  . Flonase [Fluticasone Propionate] Other (See Comments)    Worsens her migraine   . Hydrocodone Other (See Comments)    Made nervous--10/09 surgery rotator cuff  . Reglan [Metoclopramide] Other (See Comments)    Dizziness  . Topamax [Topiramate] Other (See Comments)    Dizziness   . Benadryl [Diphenhydramine Hcl] Palpitations  . Sulfa Antibiotics Anxiety  Current Outpatient Medications on File Prior to Visit  Medication Sig Dispense Refill  . ALPRAZolam (XANAX) 0.5 MG tablet TAKE 1/2 TO 1 TABLET BY MOUTH ONCE DAILY AS NEEDED FOR SLEEP/ANXIETY 15 tablet 0  . atenolol (TENORMIN) 25 MG tablet Take 12.5 mg by mouth 3 (three) times daily. For migraine prevention    . baclofen (LIORESAL) 10 MG tablet Take 10 mg by mouth daily as needed (migraines).     . Calcium Carbonate-Vitamin D 600-400 MG-UNIT tablet Take 2 tablets by mouth daily. Takes 2 in am 1 in pm    . cyanocobalamin 2000 MCG tablet Take 2,000 mcg by mouth. 3 x week fri, sat, sunday    . famotidine (PEPCID) 40 MG tablet Take 40 mg by mouth 2 (two) times daily.     Marland Kitchen glucose blood (TRUE METRIX BLOOD GLUCOSE TEST) test strip Use to check blood sugar two times a day. 100 each 6  . hydrOXYzine (ATARAX/VISTARIL) 25 MG tablet Take 1 tablet (25 mg total) by mouth 3 (three) times daily as needed. (Patient taking differently: Take 25 mg by mouth 2 (two) times daily. ) 30 tablet 0  . ibuprofen (ADVIL) 600 MG tablet Take 1 tablet (600 mg total) by mouth every 6 (six) hours as needed. 20 tablet 0  . ketorolac (TORADOL) 10 MG tablet Take 1 tablet (10 mg total) by mouth every 6 (six) hours as needed. (Patient taking differently:  Take 10 mg by mouth every 6 (six) hours as needed for moderate pain. ) 20 tablet 0  . Lancets (ACCU-CHEK MULTICLIX) lancets Use to check blood sugar two times a day. 100 each 6  . levocetirizine (XYZAL) 5 MG tablet Take 1 tablet by mouth every evening.  3  . levothyroxine (SYNTHROID) 75 MCG tablet TAKE ONE TABLET BY MOUTH ONE TIME DAILY  30 tablet 5  . Magnesium Oxide 400 (240 Mg) MG TABS Take by mouth.    . Misc Natural Products (GLUCOSAMINE CHOND COMPLEX/MSM) TABS Take 1 tablet by mouth daily.     . Multiple Vitamin (MULTIVITAMIN WITH MINERALS) TABS tablet Take 1 tablet by mouth daily.    . nabumetone (RELAFEN) 500 MG tablet Take 500 mg by mouth daily as needed (migraines).     . Omega-3 Fatty Acids (FISH OIL) 1000 MG CAPS Take 1,000 mg by mouth daily.     . OnabotulinumtoxinA (BOTOX IJ) Inject 1 Dose as directed every 6 (six) weeks.     . ondansetron (ZOFRAN ODT) 4 MG disintegrating tablet Take 1 tablet (4 mg total) by mouth every 8 (eight) hours as needed. 15 tablet 0  . pantoprazole (PROTONIX) 40 MG tablet TAKE ONE TABLET BY MOUTH ONE TIME DAILY  30 tablet 5  . Probiotic Product (PROBIOTIC-10 PO) Take 1 capsule by mouth daily.     . promethazine (PHENERGAN) 25 MG tablet Take 12.5 mg by mouth daily as needed for nausea or vomiting. When having a migraine    . TURMERIC PO Take 1 tablet by mouth daily.     Marland Kitchen letrozole (FEMARA) 2.5 MG tablet Take 1 tablet (2.5 mg total) by mouth daily. (Patient not taking: Reported on 10/25/2019) 90 tablet 3  . rizatriptan (MAXALT) 10 MG tablet Take 1 tablet (10 mg total) by mouth once as needed for up to 10 days for migraine. May repeat in 2 hours if needed 10 tablet 0   No current facility-administered medications on file prior to visit.     Review of Systems  Constitutional: Negative  for activity change, appetite change, fatigue, fever and unexpected weight change.  HENT: Negative for congestion, ear pain, rhinorrhea, sinus pressure and sore throat.     Eyes: Negative for pain, redness and visual disturbance.  Respiratory: Negative for cough, shortness of breath and wheezing.   Cardiovascular: Negative for chest pain and palpitations.  Gastrointestinal: Negative for abdominal pain, blood in stool, constipation and diarrhea.  Endocrine: Negative for polydipsia and polyuria.  Genitourinary: Negative for dysuria, frequency and urgency.  Musculoskeletal: Positive for arthralgias and myalgias. Negative for back pain.       Aches and pains-worse on her aromatase inhibitor  Skin: Negative for pallor and rash.  Allergic/Immunologic: Negative for environmental allergies.  Neurological: Negative for dizziness, syncope and headaches.  Hematological: Negative for adenopathy. Does not bruise/bleed easily.  Psychiatric/Behavioral: Negative for decreased concentration and dysphoric mood. The patient is not nervous/anxious.        Objective:   Physical Exam Constitutional:      General: She is not in acute distress.    Appearance: Normal appearance. She is normal weight. She is not ill-appearing.  HENT:     Head: Normocephalic and atraumatic.     Mouth/Throat:     Mouth: Mucous membranes are moist.  Eyes:     General: No scleral icterus.    Conjunctiva/sclera: Conjunctivae normal.     Pupils: Pupils are equal, round, and reactive to light.  Neck:     Vascular: No carotid bruit.  Cardiovascular:     Rate and Rhythm: Normal rate and regular rhythm.  Pulmonary:     Effort: Pulmonary effort is normal. No respiratory distress.     Breath sounds: Normal breath sounds. No wheezing or rales.  Musculoskeletal:        General: Tenderness present.     Cervical back: Normal range of motion and neck supple. No rigidity or tenderness.     Right lower leg: No edema.     Left lower leg: No edema.     Comments: Some Herberden's nodes on PIP joints (early/mild) No deformity in hands  L 4th middle PIP joint is too large to slide rings off  No erythema   Scant tenderness over distal hand joints  Nl rom and nl grip  Lymphadenopathy:     Cervical: No cervical adenopathy.  Skin:    General: Skin is warm and dry.     Findings: No erythema or rash.  Neurological:     Mental Status: She is alert.     Sensory: No sensory deficit.     Motor: No weakness.  Psychiatric:        Mood and Affect: Mood normal.           Assessment & Plan:   Problem List Items Addressed This Visit      Other   Bilateral hand pain    And stiffness with gradual increase in size of some PIP joints  No deformity on exam  Several Herberden's nodes in distal PIP joints Negative auto immune labs in the past No doubt inc joint pain with her aromatase inhibitor Hand xray-one view today Recommend trial of diclofenac gel prn      Relevant Orders   DG HANDS 1 VIEW BILAT BALLCATCHERS (Completed)

## 2019-11-03 NOTE — Patient Instructions (Addendum)
The diclofenac gel over the counter is helpful to use topically  It is over the counter   Keeping warm helps also   Let's get some xrays today   Take care of yourself

## 2019-11-05 NOTE — Assessment & Plan Note (Addendum)
And stiffness with gradual increase in size of some PIP joints  No deformity on exam  Several Herberden's nodes in distal PIP joints Negative auto immune labs in the past No doubt inc joint pain with her aromatase inhibitor Hand xray-one view today Recommend trial of diclofenac gel prn

## 2019-11-06 ENCOUNTER — Emergency Department
Admission: EM | Admit: 2019-11-06 | Discharge: 2019-11-06 | Disposition: A | Discharge status: Home / Self Care | Payer: BC Managed Care – PPO | Attending: Emergency Medicine | Admitting: Emergency Medicine

## 2019-11-06 ENCOUNTER — Other Ambulatory Visit: Payer: Self-pay

## 2019-11-06 DIAGNOSIS — E039 Hypothyroidism, unspecified: Secondary | ICD-10-CM | POA: Diagnosis not present

## 2019-11-06 DIAGNOSIS — Z79899 Other long term (current) drug therapy: Secondary | ICD-10-CM | POA: Diagnosis not present

## 2019-11-06 DIAGNOSIS — Z853 Personal history of malignant neoplasm of breast: Secondary | ICD-10-CM | POA: Insufficient documentation

## 2019-11-06 DIAGNOSIS — R519 Headache, unspecified: Secondary | ICD-10-CM | POA: Diagnosis not present

## 2019-11-06 DIAGNOSIS — G43001 Migraine without aura, not intractable, with status migrainosus: Secondary | ICD-10-CM | POA: Diagnosis not present

## 2019-11-06 DIAGNOSIS — M5441 Lumbago with sciatica, right side: Secondary | ICD-10-CM | POA: Insufficient documentation

## 2019-11-06 DIAGNOSIS — G43901 Migraine, unspecified, not intractable, with status migrainosus: Secondary | ICD-10-CM | POA: Diagnosis not present

## 2019-11-06 LAB — URINALYSIS, COMPLETE (UACMP) WITH MICROSCOPIC
Bilirubin Urine: NEGATIVE
Glucose, UA: NEGATIVE mg/dL
Ketones, ur: NEGATIVE mg/dL
Nitrite: NEGATIVE
Protein, ur: NEGATIVE mg/dL
Specific Gravity, Urine: 1.004 — ABNORMAL LOW (ref 1.005–1.030)
pH: 6 (ref 5.0–8.0)

## 2019-11-06 LAB — COMPREHENSIVE METABOLIC PANEL
ALT: 16 U/L (ref 0–44)
AST: 17 U/L (ref 15–41)
Albumin: 4 g/dL (ref 3.5–5.0)
Alkaline Phosphatase: 115 U/L (ref 38–126)
Anion gap: 9 (ref 5–15)
BUN: 14 mg/dL (ref 8–23)
CO2: 28 mmol/L (ref 22–32)
Calcium: 9.2 mg/dL (ref 8.9–10.3)
Chloride: 97 mmol/L — ABNORMAL LOW (ref 98–111)
Creatinine, Ser: 0.65 mg/dL (ref 0.44–1.00)
GFR calc Af Amer: >60 mL/min (ref >60)
GFR calc non Af Amer: >60 mL/min (ref >60)
Glucose, Bld: 112 mg/dL — ABNORMAL HIGH (ref 70–99)
Potassium: 3.9 mmol/L (ref 3.5–5.1)
Sodium: 134 mmol/L — ABNORMAL LOW (ref 135–145)
Total Bilirubin: 0.7 mg/dL (ref 0.3–1.2)
Total Protein: 7.8 g/dL (ref 6.5–8.1)

## 2019-11-06 LAB — CBC
HCT: 34.8 % — ABNORMAL LOW (ref 36.0–46.0)
Hemoglobin: 11.6 g/dL — ABNORMAL LOW (ref 12.0–15.0)
MCH: 26.8 pg (ref 26.0–34.0)
MCHC: 33.3 g/dL (ref 30.0–36.0)
MCV: 80.4 fL (ref 80.0–100.0)
Platelets: 307 10*3/uL (ref 150–400)
RBC: 4.33 MIL/uL (ref 3.87–5.11)
RDW: 13.8 % (ref 11.5–15.5)
WBC: 11.2 10*3/uL — ABNORMAL HIGH (ref 4.0–10.5)
nRBC: 0 % (ref 0.0–0.2)

## 2019-11-06 LAB — LIPASE, BLOOD: Lipase: 30 U/L (ref 11–51)

## 2019-11-06 MED ORDER — SODIUM CHLORIDE 0.9 % IV BOLUS
500.0000 mL | Freq: Once | INTRAVENOUS | Status: AC
  Administered 2019-11-06: 500 mL via INTRAVENOUS

## 2019-11-06 MED ORDER — METHYLPREDNISOLONE 4 MG PO TBPK: ORAL_TABLET | ORAL | 0 refills | Status: DC

## 2019-11-06 MED ORDER — KETOROLAC TROMETHAMINE 30 MG/ML IJ SOLN
15.0000 mg | INTRAMUSCULAR | Status: AC
  Administered 2019-11-06: 15 mg via INTRAVENOUS
  Filled 2019-11-06: qty 1

## 2019-11-06 MED ORDER — KETOROLAC TROMETHAMINE 30 MG/ML IJ SOLN
15.0000 mg | Freq: Once | INTRAMUSCULAR | Status: AC
  Administered 2019-11-06: 15 mg via INTRAMUSCULAR
  Filled 2019-11-06: qty 1

## 2019-11-06 MED ORDER — SODIUM CHLORIDE 0.9% FLUSH: 3.0000 mL | Freq: Once | INTRAVENOUS | Status: DC

## 2019-11-06 MED ORDER — METHYLPREDNISOLONE SODIUM SUCC 125 MG IJ SOLR
60.0000 mg | Freq: Once | INTRAMUSCULAR | Status: AC
  Administered 2019-11-06: 60 mg via INTRAVENOUS
  Filled 2019-11-06: qty 2

## 2019-11-06 NOTE — ED Notes (Signed)
Feels better after meds

## 2019-11-06 NOTE — ED Notes (Signed)
See triage note  Presents with a 5 day hx of migraine  States she takes her meds but has not had relief  Slight nausea   Also states she is having right lower back pain which is moving into right leg  Ambulates slowly to treatment room

## 2019-11-06 NOTE — ED Triage Notes (Signed)
Pt c/o HA/migraine for the past 5 days with a hx of but states she is not able to get any relief with her normal migraine meds, pt states she is having sever right lower back pain and just had a hysterectomy on 12/4.

## 2019-11-06 NOTE — ED Provider Notes (Signed)
[facility] Emergency Department Provider Note  ____________________________________________  Time seen: Approximately 2:40 PM  I have reviewed the triage vital signs and the nursing notes.   HISTORY  Chief Complaint Back Pain and Migraine    HPI Jennifer [name] is a 63 y.o. female with a history of breast cancer, migraines, and prior episodes of low back pain who comes the ED complaining of a typical migraine headache that has been ongoing for the past 5 days continuously.  Pain is severe without aggravating or alleviating factors currently.  She tried using her triptan's as she normally does when the headache started, without relief this time.  No atypical features, no fevers chills or trauma.  No motor weakness or paresthesias.   Also complains of right low back pain which radiates down the back of the thigh.  Again no recent trauma, no motor weakness or bowel or bladder incontinence or retention.  She had hysterectomy and BSO 10 days ago, and the patient wonders whether her resting for surgical recovery caused her prior back pain to flareup.  It is worse with movement, somewhat relieved by applying Voltaren gel.   Past Medical History:  Diagnosis Date  . Allergic rhinitis   . Anxiety   . Arthritis    hands, knees  . Breast cancer ([place]) 2019   Right Breast Cancer  . Cancer ([place]) 09/2018   Breast CA new DX, right breast  . Cervical dysplasia   . Endometriosis   . Esophageal reflux   . Family history of adverse reaction to anesthesia    younger sister anxiety for a dew days after  . Family history of breast cancer   . Family history of breast cancer   . Family history of prostate cancer   . Hypothyroid   . Migraine with aura, without mention of intractable migraine without mention of status migrainosus   . Osteopenia 08/2019   T score -2.2 FRAX 11% / 1.7%  . Personal history of radiation therapy 2019   Right Breast Cancer  . Pre-diabetes       Patient Active Problem List   Diagnosis Date Noted  . Bilateral hand pain 11/03/2019  . Family history of prostate cancer   . Adnexal cyst 09/26/2019  . Cystitis with hematuria 09/26/2019  . Pain of left calf 03/21/2019  . Use of anastrozole (Arimidex) 03/21/2019  . Elevated transaminase level 01/03/2019  . Family history of breast cancer   . Stress reaction 09/26/2018  . Malignant neoplasm of upper-inner quadrant of right breast in female, estrogen receptor positive ([place]) 09/20/2018  . B12 deficiency 04/13/2018  . Joint pain 04/11/2018  . Skin pain 04/11/2018  . Paresthesia 04/11/2018  . Colon cancer screening 09/16/2017  . Prediabetes 07/28/2016  . Allergic rhinitis 11/13/2014  . Screening for lipoid disorders 09/26/2014  . Preventative health care 09/26/2014  . Routine general medical examination at a health care facility 10/28/2012  . Herpes labialis 04/28/2011  . GERD 09/16/2010  . Hypothyroidism 05/07/2008  . Osteopenia 05/07/2008  . Migraine with aura 11/11/2007     Past Surgical History:  Procedure Laterality Date  . ABDOMINAL HYSTERECTOMY    . BREAST EXCISIONAL BIOPSY Bilateral over 10 years ago   benign x 2   . BREAST LUMPECTOMY Right 10/10/2018  . BREAST LUMPECTOMY WITH RADIOACTIVE SEED AND SENTINEL LYMPH NODE BIOPSY Right 10/10/2018   Procedure: RIGHT BREAST LUMPECTOMY WITH RADIOACTIVE SEED AND RIGHT SENTINEL LYMPH NODE BIOPSY;  Surgeon: Jovita Kussmaul, MD;  Location:  [place];  Service: General;  Laterality: Right;  . BREAST SURGERY     Benign breast lump excised  . CERVICAL CONE BIOPSY  1985   severe dysplasia  . COLONOSCOPY  2005 and dec 2019   normal  . COLPOSCOPY    . endoscopy  10/2018  . LAPAROSCOPIC BILATERAL SALPINGO OOPHERECTOMY Bilateral 10/27/2019   Procedure: LAPAROSCOPIC BILATERAL SALPINGO OOPHORECTOMY WITH PERITONEAL WASHINGS;  Surgeon: Princess Bruins, MD;  Location: [place];  Service: Gynecology;   Laterality: Bilateral;  request 1:00pm on Friday, Dec. 4th in [place] time held requests one hour OR time  . moles removed from upper back, face lft, 1 between breasts, upper leg left inside  10/18/2019   wearing small round bandaids on for 2 weeks  . ROTATOR CUFF REPAIR Bilateral 08/2008     Prior to Admission medications   Medication Sig Start Date End Date Taking? Authorizing Provider  ALPRAZolam (XANAX) 0.5 MG tablet TAKE 1/2 TO 1 TABLET BY MOUTH ONCE DAILY AS NEEDED FOR SLEEP/ANXIETY 07/18/19   Tower, Marne A, MD  atenolol (TENORMIN) 25 MG tablet Take 12.5 mg by mouth 3 (three) times daily. For migraine prevention    [provider]  baclofen (LIORESAL) 10 MG tablet Take 10 mg by mouth daily as needed (migraines).     [provider]  Calcium Carbonate-Vitamin D 600-400 MG-UNIT tablet Take 2 tablets by mouth daily. Takes 2 in am 1 in pm    [provider]  cyanocobalamin 2000 MCG tablet Take 2,000 mcg by mouth. 3 x week fri, sat, sunday    [provider]  famotidine (PEPCID) 40 MG tablet Take 40 mg by mouth 2 (two) times daily.     [provider]  glucose blood (TRUE METRIX BLOOD GLUCOSE TEST) test strip Use to check blood sugar two times a day. 03/15/19   Tower, [name], MD  hydrOXYzine (ATARAX/VISTARIL) 25 MG tablet Take 1 tablet (25 mg total) by mouth 3 (three) times daily as needed. Patient taking differently: Take 25 mg by mouth 2 (two) times daily.  10/18/17   Triplett, Johnette Abraham B, FNP  ibuprofen (ADVIL) 600 MG tablet Take 1 tablet (600 mg total) by mouth every 6 (six) hours as needed. 10/27/19   Princess Bruins, MD  ketorolac (TORADOL) 10 MG tablet Take 1 tablet (10 mg total) by mouth every 6 (six) hours as needed. Patient taking differently: Take 10 mg by mouth every 6 (six) hours as needed for moderate pain.  06/07/17   Gregor Hams, MD  Lancets (ACCU-CHEK MULTICLIX) lancets Use to check blood sugar two times a day.  03/20/19   Tower, [name], MD  levocetirizine (XYZAL) 5 MG tablet Take 1 tablet by mouth every evening. 10/28/17   [provider]  levothyroxine (SYNTHROID) 75 MCG tablet TAKE ONE TABLET BY MOUTH ONE TIME DAILY  07/26/19   Tower, [name], MD  Magnesium Oxide 400 (240 Mg) MG TABS Take by mouth.    [provider]  Misc Natural Products (GLUCOSAMINE CHOND COMPLEX/MSM) TABS Take 1 tablet by mouth daily.     [provider]  Multiple Vitamin (MULTIVITAMIN WITH MINERALS) TABS tablet Take 1 tablet by mouth daily.    [provider]  nabumetone (RELAFEN) 500 MG tablet Take 500 mg by mouth daily as needed (migraines).     [provider]  Omega-3 Fatty Acids (FISH OIL) 1000 MG CAPS Take 1,000 mg by mouth daily.  [provider]  OnabotulinumtoxinA (BOTOX IJ) Inject 1 Dose as directed every 6 (six) weeks.     [provider]  ondansetron (ZOFRAN ODT) 4 MG disintegrating tablet Take 1 tablet (4 mg total) by mouth every 8 (eight) hours as needed. 11/03/18   Menshew, Dannielle Karvonen, PA-C  pantoprazole (PROTONIX) 40 MG tablet TAKE ONE TABLET BY MOUTH ONE TIME DAILY  05/29/19   Tower, [name], MD  Probiotic Product (PROBIOTIC-10 PO) Take 1 capsule by mouth daily.     [provider]  promethazine (PHENERGAN) 25 MG tablet Take 12.5 mg by mouth daily as needed for nausea or vomiting. When having a migraine    [provider]  rizatriptan (MAXALT) 10 MG tablet Take 1 tablet (10 mg total) by mouth once as needed for up to 10 days for migraine. May repeat in 2 hours if needed 11/03/18 10/25/19  Menshew, Dannielle Karvonen, PA-C  TURMERIC PO Take 1 tablet by mouth daily.     [provider]     Allergies Ciprofloxacin, Codeine phosphate, Decongestant [pseudoephedrine], Flonase [fluticasone propionate], Hydrocodone, Reglan [metoclopramide], Topamax [topiramate], Benadryl [diphenhydramine hcl], and Sulfa antibiotics   Family History   Problem Relation Age of Onset  . Breast cancer Mother 69  . Diabetes Mother   . Hypertension Father   . Heart disease Father   . Osteoporosis Maternal Grandmother   . Diabetes Maternal Grandmother   . Hypertension Sister   . COPD Paternal Grandmother   . Heart disease Paternal Grandfather   . Breast cancer Cousin 78       pat first cousin  . Breast cancer Cousin        mat second cousin with breast cancer in her 7s  . Prostate cancer Other 12  . Thyroid cancer Cousin 97       pat first cousin    Social History Social History   Tobacco Use  . Smoking status: Never Smoker  . Smokeless tobacco: Never Used  Substance Use Topics  . Alcohol use: No    Alcohol/week: 0.0 standard drinks  . Drug use: No    Review of Systems  Constitutional:   No fever or chills.  ENT:   No sore throat. No rhinorrhea. Cardiovascular:   No chest pain or syncope. Respiratory:   No dyspnea or cough. Gastrointestinal:   Negative for abdominal pain, vomiting and diarrhea.  Musculoskeletal: Acute on chronic low back pain as above All other systems reviewed and are negative except as documented above in ROS and HPI.  ____________________________________________   PHYSICAL EXAM:  VITAL SIGNS: ED Triage Vitals  Enc Vitals Group     BP 11/06/19 1226 120/70     Pulse Rate 11/06/19 1226 67     Resp 11/06/19 1226 16     Temp 11/06/19 1226 98.1 F (36.7 C)     Temp Source 11/06/19 1226 Oral     SpO2 11/06/19 1226 98 %     Weight 11/06/19 1227 125 lb (56.7 kg)     Height 11/06/19 1227 5\' 1"  (1.549 m)     Head Circumference --      Peak Flow --      Pain Score 11/06/19 1226 8     Pain Loc --      Pain Edu? --      Excl. in [place]? --     Vital signs reviewed, nursing assessments reviewed.   Constitutional:   Alert and oriented. Non-toxic appearance. Eyes:  Conjunctivae are normal. EOMI. ENT      Head:   Normocephalic and atraumatic.      Nose:   Wearing a mask.      Mouth/Throat:    Wearing a mask.      Neck:   No meningismus. Full ROM. Cardiovascular:   RRR.   No murmurs.  Respiratory:   Normal respiratory effort without tachypnea/retractions. Breath sounds are clear and equal bilaterally. No wheezes/rales/rhonchi. Gastrointestinal:   Soft and nontender. Non distended. There is no CVA tenderness.  No rebound, rigidity, or guarding.  Laparoscopic surgical incisions healing well. Musculoskeletal:   Normal range of motion in all extremities. No joint effusions.  No lower extremity tenderness.  No edema.  No midline spinal tenderness. Neurologic:   Normal speech and language.  Motor grossly intact. No acute focal neurologic deficits are appreciated.   ____________________________________________    LABS (pertinent positives/negatives) (all labs ordered are listed, but only abnormal results are displayed) Labs Reviewed  COMPREHENSIVE METABOLIC PANEL - Abnormal; Notable for the following components:      Result Value   Sodium 134 (*)    Chloride 97 (*)    Glucose, Bld 112 (*)    All other components within normal limits  CBC - Abnormal; Notable for the following components:   WBC 11.2 (*)    Hemoglobin 11.6 (*)    HCT 34.8 (*)    All other components within normal limits  URINALYSIS, COMPLETE (UACMP) WITH MICROSCOPIC - Abnormal; Notable for the following components:   Color, Urine STRAW (*)    APPearance CLEAR (*)    Specific Gravity, Urine 1.004 (*)    Hgb urine dipstick SMALL (*)    Leukocytes,Ua TRACE (*)    Bacteria, UA RARE (*)    All other components within normal limits  LIPASE, BLOOD   ____________________________________________   EKG    ____________________________________________    RADIOLOGY  No results found.  ____________________________________________   PROCEDURES Procedures  ____________________________________________    CLINICAL IMPRESSION / ASSESSMENT AND PLAN / ED COURSE  Medications ordered in the ED: Medications   sodium chloride 0.9 % bolus 500 mL (has no administration in time range)  ketorolac (TORADOL) 30 MG/ML injection 15 mg (has no administration in time range)  methylPREDNISolone sodium succinate (SOLU-MEDROL) 125 mg/2 mL injection 60 mg (has no administration in time range)    Pertinent labs & imaging results that were available during my care of the patient were reviewed by me and considered in my medical decision making (see chart for details).  Tanara Penafiel was evaluated in Emergency Department on 11/06/2019 for the symptoms described in the history of present illness. She was evaluated in the context of the global COVID-19 pandemic, which necessitated consideration that the patient might be at risk for infection with the SARS-CoV-2 virus that causes COVID-19. Institutional protocols and algorithms that pertain to the evaluation of patients at risk for COVID-19 are in a state of rapid change based on information released by regulatory bodies including the CDC and federal and state organizations. These policies and algorithms were followed during the patient's care in the ED.   Patient presents with 5 days of severe headache, consistent with established migraine pattern, accompanied by acute on chronic low back pain.  Vital signs are normal, exam is reassuring.  Considering the patient's symptoms, medical history, and physical examination today, I have low suspicion for ischemic stroke, intracranial hemorrhage, meningitis, encephalitis, carotid or vertebral dissection, venous sinus thrombosis, MS, intracranial hypertension,  glaucoma, CRAO, CRVO, or temporal arteritis, spinal epidural abscess, cauda equina syndrome, spinal hematoma, or septic joint.  We will give IV fluids, IV Toradol 15 mg, IV Solu-Medrol 60 mg for pain relief.  This should help with both the migraine headache and the low back pain.  Anticipate patient will be stable for discharge home for continued  follow-up.  ----------------------------------------- 4:06 PM on 11/06/2019 -----------------------------------------  Patient feeling better, all questions answered, stable for discharge home.  She notes that she is scheduled to have a minor oral surgical procedure in 2 days.  Recommended she call her surgeon to ensure that her current symptoms or the use of a steroid Dosepak will not interfere with the procedure.     ____________________________________________   FINAL CLINICAL IMPRESSION(S) / ED DIAGNOSES    Final diagnoses:  Migraine with status migrainosus, not intractable, unspecified migraine type  Acute right-sided low back pain with right-sided sciatica     ED Discharge Orders    None      Portions of this note were generated with dragon dictation software. Dictation errors may occur despite best attempts at proofreading.   Carrie Mew, MD 11/06/19 2001

## 2019-11-06 NOTE — ED Notes (Signed)
States headache is gone but states back pain is worse  Dr Joni Fears aware

## 2019-11-13 DIAGNOSIS — M76821 Posterior tibial tendinitis, right leg: Secondary | ICD-10-CM | POA: Diagnosis not present

## 2019-11-14 ENCOUNTER — Encounter: Payer: Self-pay | Admitting: Genetic Counselor

## 2019-11-14 ENCOUNTER — Ambulatory Visit: Payer: Self-pay | Admitting: Genetic Counselor

## 2019-11-14 ENCOUNTER — Telehealth: Payer: Self-pay | Admitting: Genetic Counselor

## 2019-11-14 DIAGNOSIS — Z1379 Encounter for other screening for genetic and chromosomal anomalies: Secondary | ICD-10-CM

## 2019-11-14 NOTE — Telephone Encounter (Signed)
Revealed negative genetic testing.  Discussed that we do not know why she has breast cancer or why there is cancer in the family. It could be due to a different gene that we are not testing, or maybe our current technology may not be able to pick something up.  It will be important for her to keep in contact with genetics to keep up with whether additional testing may be needed. 

## 2019-11-14 NOTE — Progress Notes (Signed)
HPI:  Jennifer [name] was previously seen in the [place] clinic due to a personal and family history of cancer and concerns regarding a hereditary predisposition to cancer. Please refer to our prior cancer genetics clinic note for more information regarding our discussion, assessment and recommendations, at the time. Jennifer [name] recent genetic test results were disclosed to her, as were recommendations warranted by these results. These results and recommendations are discussed in more detail below.  CANCER HISTORY:  Oncology History  Malignant neoplasm of upper-inner quadrant of right breast in female, estrogen receptor positive ([place])  09/13/2018 Initial Diagnosis   Diagnostic mammogram detected right breast mass with distortion LIQ at 2:00 9 cm from nipple 1.7 cm, at 1:00 there was a benign 1.2 cm fibrocystic change, biopsy of the 2:00 mass revealed grade 1 IDC with DCIS ER 100%, PR 90%, Ki-67 10%, HER-2 1+ negative, T1CN0 stage Ia clinical stage   10/10/2018 Surgery   Right lumpectomy: IDC grade 1, 1.2 cm, with intermediate grade DCIS, margins negative, 0/2 lymph nodes negative, T1CN0 stage Ia ER 100%, PR 90%, Ki-67 10%, HER-2 1+ negative    10/10/2018 Oncotype testing   oncotype results of 29: Risk of recurrence without chemo 18%   11/07/2018 - 12/08/2018 Radiation Therapy   1. Right Breast / 42.56 Gy in 16 fractions 2. Right Breast Boost / 8 Gy in 4 fractions - Total dose 50.56 Gy   12/2018 -  Anti-estrogen oral therapy   Anastrozole daily, planned for 7 years   11/11/2019 Genetic Testing   Negative genetic testing on the common hereditary cancer panel.  The Common Hereditary Gene Panel offered by [facility] includes sequencing and/or deletion duplication testing of the following 48 genes: APC, ATM, AXIN2, BARD1, BMPR1A, BRCA1, BRCA2, BRIP1, CDH1, CDK4, CDKN2A (p14ARF), CDKN2A (p16INK4a), CHEK2, CTNNA1, DICER1, EPCAM (Deletion/duplication testing only), GREM1 (promoter region  deletion/duplication testing only), KIT, MEN1, MLH1, MSH2, MSH3, MSH6, MUTYH, NBN, NF1, NHTL1, PALB2, PDGFRA, PMS2, POLD1, POLE, PTEN, RAD50, RAD51C, RAD51D, RNF43, SDHB, SDHC, SDHD, SMAD4, SMARCA4. STK11, TP53, TSC1, TSC2, and VHL.  The following genes were evaluated for sequence changes only: SDHA and HOXB13 c.251G>A variant only. The report date is 11/11/2019     FAMILY HISTORY:  We obtained a detailed, 4-generation family history.  Significant diagnoses are listed below: Family History  Problem Relation Age of Onset  . Breast cancer Mother 63  . Diabetes Mother   . Hypertension Father   . Heart disease Father   . Osteoporosis Maternal Grandmother   . Diabetes Maternal Grandmother   . Hypertension Sister   . COPD Paternal Grandmother   . Heart disease Paternal Grandfather   . Breast cancer Cousin 6       pat first cousin  . Breast cancer Cousin        mat second cousin with breast cancer in her 50s  . Prostate cancer Other 5  . Thyroid cancer Cousin 40       pat first cousin    The patient does not have children. She has one brother and two sisters. None of her siblings have had cancer. The patient's mother is living and her father is deceased.  The patient's mother was diagnosed with breast cancer at 79. She is an only child. There is no information on her father's family and her mother died at 6. Her mother had several siblings, some had cancer but the patient is not sure which ones had which cancers. She knows that one brother  had prostate cancer and one sister has a granddaughter who had breast cancer in her 9's (the patient's second cousin).  The patient's father is deceased from heart failure. He had a brother and sister. The sister has a grandson with end stage lung cancer and a daughter who was diagnosed with thyroid cancer at 12. The brother has a daughter who had breast cancer at 72. The paternal grandparents are deceased from non cancer related  issues.  Jennifer [name] unawareof previous family history of genetic testing for hereditary cancer risks. Patient's maternal ancestors are of Spanish and Mex[place]escent, and paternal ancestors are of [place] and [place]. There is noreported Ashkenazi Jewish ancestry. There is noknown consanguinity.     GENETIC TEST RESULTS: Genetic testing reported out on November 11, 2019 through the common hereditary cancer panel found no pathogenic mutations. The Common Hereditary Gene Panel offered by [facility] includes sequencing and/or deletion duplication testing of the following 48 genes: APC, ATM, AXIN2, BARD1, BMPR1A, BRCA1, BRCA2, BRIP1, CDH1, CDK4, CDKN2A (p14ARF), CDKN2A (p16INK4a), CHEK2, CTNNA1, DICER1, EPCAM (Deletion/duplication testing only), GREM1 (promoter region deletion/duplication testing only), KIT, MEN1, MLH1, MSH2, MSH3, MSH6, MUTYH, NBN, NF1, NHTL1, PALB2, PDGFRA, PMS2, POLD1, POLE, PTEN, RAD50, RAD51C, RAD51D, RNF43, SDHB, SDHC, SDHD, SMAD4, SMARCA4. STK11, TP53, TSC1, TSC2, and VHL.  The following genes were evaluated for sequence changes only: SDHA and HOXB13 c.251G>A variant only. The test report has been scanned into EPIC and is located under the Molecular Pathology section of the Results Review tab.  A portion of the result report is included below for reference.     We discussed with Jennifer [name] that because current genetic testing is not perfect, it is possible there may be a gene mutation in one of these genes that current testing cannot detect, but that chance is small.  We also discussed, that there could be another gene that has not yet been discovered, or that we have not yet tested, that is responsible for the cancer diagnoses in the family. It is also possible there is a hereditary cause for the cancer in the family that Jennifer [name] did not inherit and therefore was not identified in her testing.  Therefore, it is important to remain in touch with cancer genetics in the future  so that we can continue to offer Jennifer [name] the most up to date genetic testing.   ADDITIONAL GENETIC TESTING: We discussed with Jennifer [name] that there are other genes that are associated with increased cancer risk that can be analyzed. Should Jennifer [name] wish to pursue additional genetic testing, we are happy to discuss and coordinate this testing, at any time.    CANCER SCREENING RECOMMENDATIONS: Jennifer [name] test result is considered negative (normal).  This means that we have not identified a hereditary cause for her personal and family history of cancer at this time. Most cancers happen by chance and this negative test suggests that her cancer may fall into this category.    While reassuring, this does not definitively rule out a hereditary predisposition to cancer. It is still possible that there could be genetic mutations that are undetectable by current technology. There could be genetic mutations in genes that have not been tested or identified to increase cancer risk.  Therefore, it is recommended she continue to follow the cancer management and screening guidelines provided by her oncology and primary healthcare provider.   An individual's cancer risk and medical management are not determined by genetic test results alone. Overall cancer risk assessment incorporates additional  factors, including personal medical history, family history, and any available genetic information that may result in a personalized plan for cancer prevention and surveillance  RECOMMENDATIONS FOR FAMILY MEMBERS:  Individuals in this family might be at some increased risk of developing cancer, over the general population risk, simply due to the family history of cancer.  We recommended women in this family have a yearly mammogram beginning at age 11, or 34 years younger than the earliest onset of cancer, an annual clinical breast exam, and perform monthly breast self-exams. Women in this family should also have a gynecological exam  as recommended by their primary provider. All family members should have a colonoscopy by age 22.  FOLLOW-UP: Lastly, we discussed with Jennifer [name] that cancer genetics is a rapidly advancing field and it is possible that new genetic tests will be appropriate for her and/or her family members in the future. We encouraged her to remain in contact with cancer genetics on an annual basis so we can update her personal and family histories and let her know of advances in cancer genetics that may benefit this family.   Our contact number was provided. Jennifer [name] questions were answered to her satisfaction, and she knows she is welcome to call us at anytime with additional questions or concerns.   Roma Kayser, [place], [facility] Licensed, Certified Genetic Counselor [email or web address]

## 2019-11-14 NOTE — Telephone Encounter (Signed)
LM on VM that results are back and to please call.  Left CB instructions. 

## 2019-11-21 ENCOUNTER — Other Ambulatory Visit: Payer: Self-pay

## 2019-11-21 ENCOUNTER — Ambulatory Visit: Payer: BC Managed Care – PPO | Admitting: Psychology

## 2019-11-22 ENCOUNTER — Encounter: Payer: Self-pay | Admitting: Obstetrics & Gynecology

## 2019-11-22 ENCOUNTER — Ambulatory Visit (INDEPENDENT_AMBULATORY_CARE_PROVIDER_SITE_OTHER): Payer: BC Managed Care – PPO | Admitting: Obstetrics & Gynecology

## 2019-11-22 VITALS — BP 124/80

## 2019-11-22 DIAGNOSIS — Z09 Encounter for follow-up examination after completed treatment for conditions other than malignant neoplasm: Secondary | ICD-10-CM

## 2019-11-22 DIAGNOSIS — T7840XA Allergy, unspecified, initial encounter: Secondary | ICD-10-CM

## 2019-11-22 NOTE — Progress Notes (Signed)
    Jennifer [name] [birth year] [id number]        63 y.o.  G0   RP: Postop LPS BSO peritoneal washings on 10/27/2019  HPI: Good postop progression.  Mild redness around the incisions.  No abdominopelvic pain.  No vaginal bleeding or d/c.  Urine/BMs normal.  No fever.   OB History  Gravida Para Term Preterm AB Living  0 0 0 0 0 0  SAB TAB Ectopic Multiple Live Births  0 0 0 0 0    Past medical history,surgical history, problem list, medications, allergies, family history and social history were all reviewed and documented in the EPIC chart.   Directed ROS with pertinent positives and negatives documented in the history of present illness/assessment and plan.  Exam:  Vitals:   11/22/19 1443  BP: 124/80   General appearance:  Normal  Abdomen: Incisions well closed, no drainage, but erythema from Dermabond  Gynecologic exam: Vulva normal.  Bimanual exam:  Uterus AV, normal volume, mobile, NT.  No adnexal mass, non-tender bilaterally.  Patho 10/27/2019: FINAL MICROSCOPIC DIAGNOSIS:   A. BILATERAL FALLOPIAN TUBES AND OVARIES, BILATERAL  SALPINGO-OOPHORECTOMY:  - Serous cystadenoma of larger ovary. Corpora albicantia and inclusion  cysts of smaller ovary.  - Fallopian tubes with no significant histopathologic findings.    Assessment/Plan:  63 y.o. G0P0000   1. Status post gynecological surgery, follow-up exam Good postop healing.  No complication except for allergy to Dermabond.  Surgical findings and benign pathology discussed with patient.  Patient reassured.  2. Allergy, initial encounter Allergy to Dermabond.  Glue removed as much as possible.  Will remove the rest using Vaseline.  Will then use her Mycolog ointment until the erythema resolves.    Other orders - nystatin-triamcinolone (MYCOLOG II) cream; Apply 1 application topically 2 (two) times daily.  Princess Bruins MD, 2:49 PM 11/22/2019

## 2019-11-23 ENCOUNTER — Encounter: Payer: Self-pay | Admitting: Gynecology

## 2019-11-23 ENCOUNTER — Ambulatory Visit (INDEPENDENT_AMBULATORY_CARE_PROVIDER_SITE_OTHER): Payer: BC Managed Care – PPO | Admitting: Gynecology

## 2019-11-23 VITALS — BP 116/68 | Ht 61.0 in | Wt 130.6 lb

## 2019-11-23 DIAGNOSIS — Z01419 Encounter for gynecological examination (general) (routine) without abnormal findings: Secondary | ICD-10-CM

## 2019-11-23 DIAGNOSIS — N952 Postmenopausal atrophic vaginitis: Secondary | ICD-10-CM | POA: Diagnosis not present

## 2019-11-23 DIAGNOSIS — M8580 Other specified disorders of bone density and structure, unspecified site: Secondary | ICD-10-CM

## 2019-11-23 DIAGNOSIS — Z853 Personal history of malignant neoplasm of breast: Secondary | ICD-10-CM

## 2019-11-23 NOTE — Progress Notes (Signed)
    Jennifer [name] 06-14-1956 [id number]        62 y.o.  G0P0000 for annual gynecologic exam.  Recent laparoscopic BSO for serous cystadenoma.  Has done well postoperatively.  Without gynecologic complaints  Past medical history,surgical history, problem list, medications, allergies, family history and social history were all reviewed and documented as reviewed in the EPIC chart.  ROS:  Performed with pertinent positives and negatives included in the history, assessment and plan.   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  General appearance:  Normal affect, orientation and appearance. Skin: Grossly normal HEENT: Without gross lesions.  No cervical or supraclavicular adenopathy. Thyroid normal.  Lungs:  Clear without wheezing, rales or rhonchi Cardiac: RR, without RMG Abdominal:  Soft, nontender, without masses, guarding, rebound, organomegaly or hernia.  Well healing laparoscopic incision sites. Breasts:  Examined lying and sitting without masses, retractions, discharge or axillary adenopathy. Pelvic:  Ext, BUS, Vagina: With atrophic changes  Cervix: With atrophic changes  Uterus: Anteverted, normal size, shape and contour, midline and mobile nontender   Adnexa: Without masses or tenderness    Anus and perineum: Normal   Rectovaginal: Normal sphincter tone without palpated masses or tenderness.    Assessment/Plan:  63 y.o. G0P0000 female for annual gynecologic exam.   1. Recent laparoscopic BSO for ovarian cyst which showed serous cystadenoma.  Has done well postoperatively. 2. History of right sided breast cancer.  Genetic testing negative.  Exam NED.  Mammography 08/2019.  Continue to follow-up with oncology. 3. Postmenopausal.  No significant menopausal symptoms or any vaginal bleeding. 4. Osteopenia.  Recent DEXA T score -2.2  FRAX 11% / 1.7%.  We discussed weightbearing exercise on a regular basis as well as calcium and vitamin D supplement.  Recommend checking vitamin D level today. 5. Pap smear 10/2018.  No Pap smear done today.  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  Plan repeat Pap smear at 3-year interval per current screening guidelines. 6. Colonoscopy 2019.  Repeat at their recommended interval. 7. Health maintenance.  No routine lab work done as patient does this elsewhere.  Patient understands I am retiring and will not see her lab work results as my partner will review.  Of asked her to make sure she checks it herself on MyChart to know that it has returned and normal.  Follow-up in 1 year, sooner as needed.   Anastasio Auerbach MD, 11:10 AM 11/23/2019

## 2019-11-23 NOTE — Patient Instructions (Signed)
Check the results of your vitamin D level on MyChart.  Follow-up in 1 year for annual exam

## 2019-11-25 ENCOUNTER — Other Ambulatory Visit: Payer: Self-pay | Admitting: Family Medicine

## 2019-11-27 ENCOUNTER — Other Ambulatory Visit: Payer: Self-pay | Admitting: *Deleted

## 2019-11-27 MED ORDER — ALPRAZOLAM 0.5 MG PO TABS: ORAL_TABLET | ORAL | 0 refills | Status: DC

## 2019-11-27 NOTE — Telephone Encounter (Signed)
Patient left a voicemail requesting a refill on Alprazolam. Last refill 07/18/19 #15 Last office visit 11/03/19

## 2019-11-28 ENCOUNTER — Encounter: Payer: Self-pay | Admitting: Obstetrics & Gynecology

## 2019-11-28 ENCOUNTER — Ambulatory Visit: Payer: BC Managed Care – PPO | Attending: Internal Medicine

## 2019-11-28 DIAGNOSIS — Z20822 Contact with and (suspected) exposure to covid-19: Secondary | ICD-10-CM | POA: Diagnosis not present

## 2019-11-28 NOTE — Patient Instructions (Signed)
1. Status post gynecological surgery, follow-up exam Good postop healing.  No complication except for allergy to Dermabond.  Surgical findings and benign pathology discussed with patient.  Patient reassured.  2. Allergy, initial encounter Allergy to Dermabond.  Glue removed as much as possible.  Will remove the rest using Vaseline.  Will then use her Mycolog ointment until the erythema resolves.    Other orders - nystatin-triamcinolone (MYCOLOG II) cream; Apply 1 application topically 2 (two) times daily.  Jennifer [name], it was a pleasure seeing you today!

## 2019-11-30 LAB — NOVEL CORONAVIRUS, NAA: SARS-CoV-2, NAA: NOT DETECTED

## 2019-12-11 DIAGNOSIS — G43719 Chronic migraine without aura, intractable, without status migrainosus: Secondary | ICD-10-CM | POA: Diagnosis not present

## 2019-12-14 DIAGNOSIS — M542 Cervicalgia: Secondary | ICD-10-CM | POA: Diagnosis not present

## 2019-12-14 DIAGNOSIS — G43019 Migraine without aura, intractable, without status migrainosus: Secondary | ICD-10-CM | POA: Diagnosis not present

## 2019-12-14 DIAGNOSIS — G43719 Chronic migraine without aura, intractable, without status migrainosus: Secondary | ICD-10-CM | POA: Diagnosis not present

## 2019-12-22 ENCOUNTER — Ambulatory Visit: Payer: BC Managed Care – PPO | Attending: Internal Medicine

## 2019-12-22 DIAGNOSIS — Z20822 Contact with and (suspected) exposure to covid-19: Secondary | ICD-10-CM

## 2019-12-22 IMAGING — MG DIGITAL DIAGNOSTIC UNILATERAL RIGHT MAMMOGRAM WITH TOMO AND CAD
8 of 11 series · 8 of 27 positions shown · non-contrast
Comparison: Previous exam(s).

CLINICAL DATA: Follow-up for probably benign dystrophic type
calcifications in the right breast.

EXAM:
DIGITAL DIAGNOSTIC UNILATERAL RIGHT MAMMOGRAM WITH CAD AND TOMO
RIGHT BREAST ULTRASOUND

[R ML (1 of 2)]
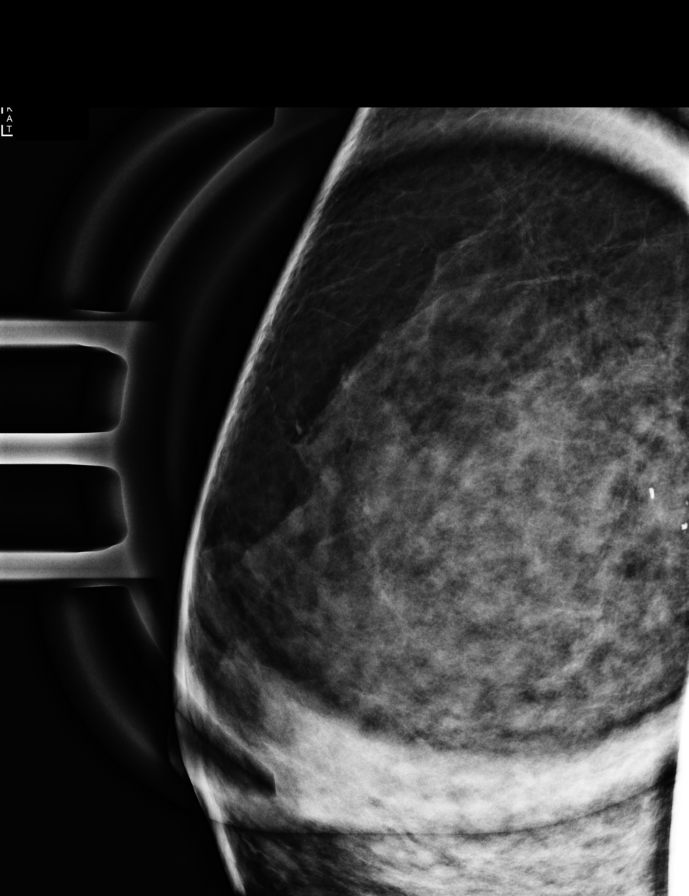

[R ML (2 of 2)]
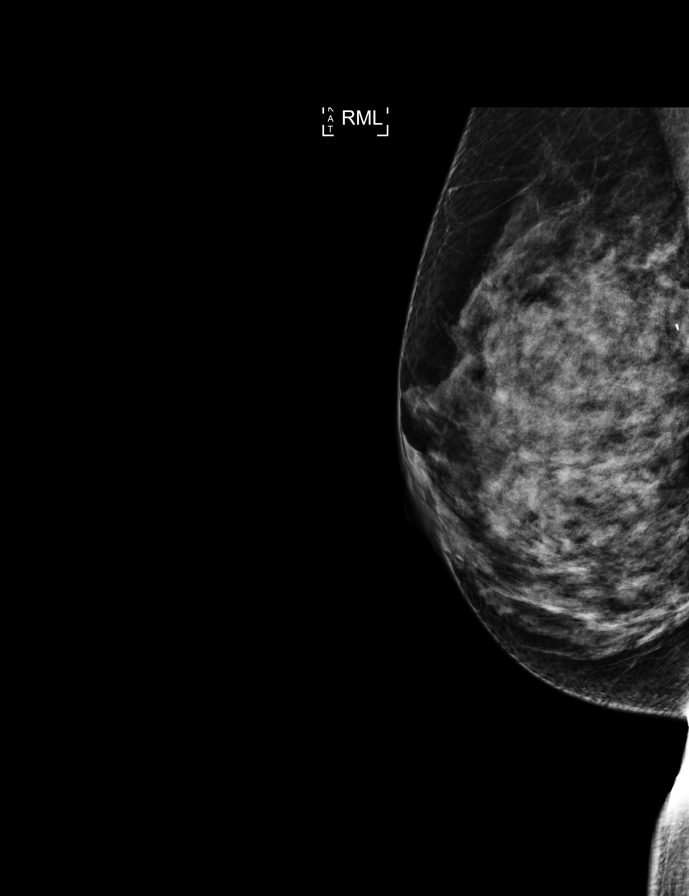

[R XCCL]
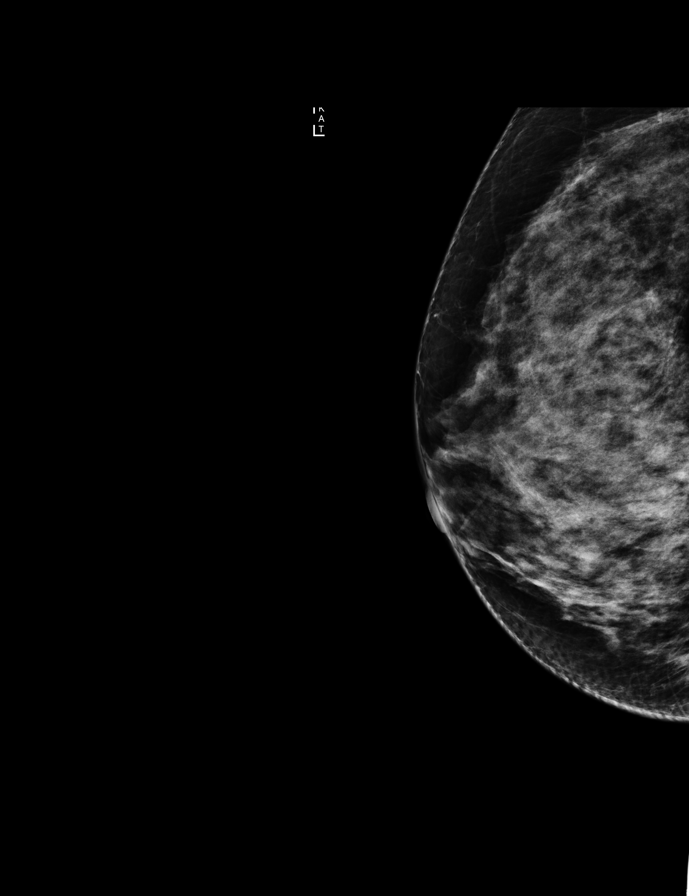

[R MLO synth-2D (1 of 2)]
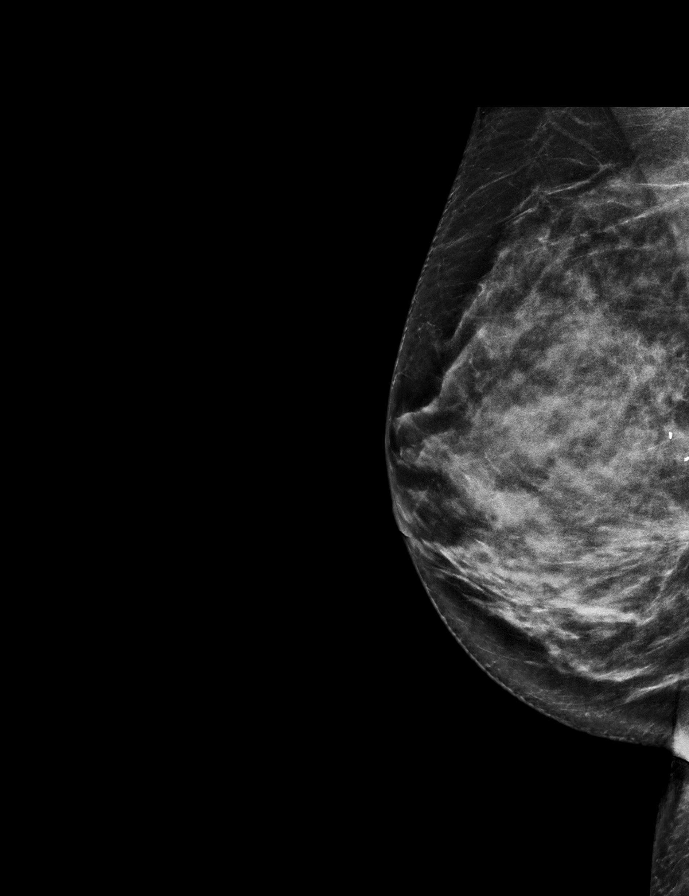

[R MLO synth-2D (2 of 2)]
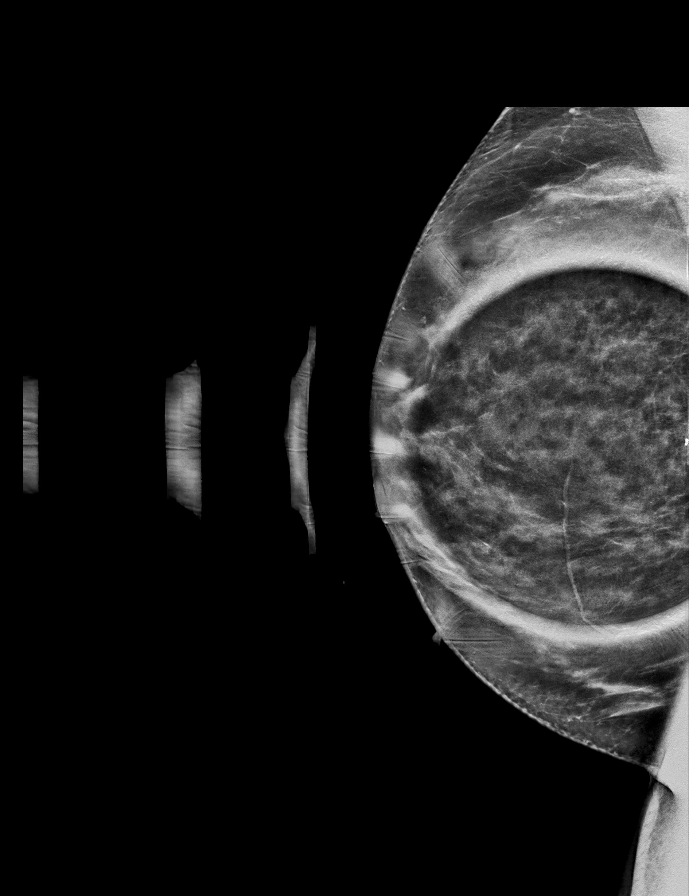

[R CC synth-2D (1 of 2)]
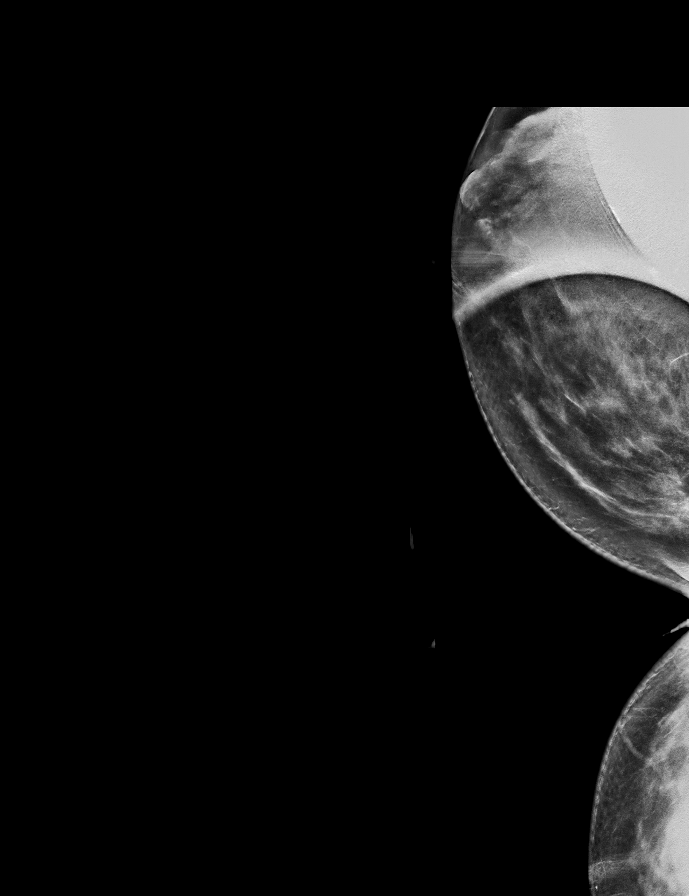

[R CC synth-2D (2 of 2)]
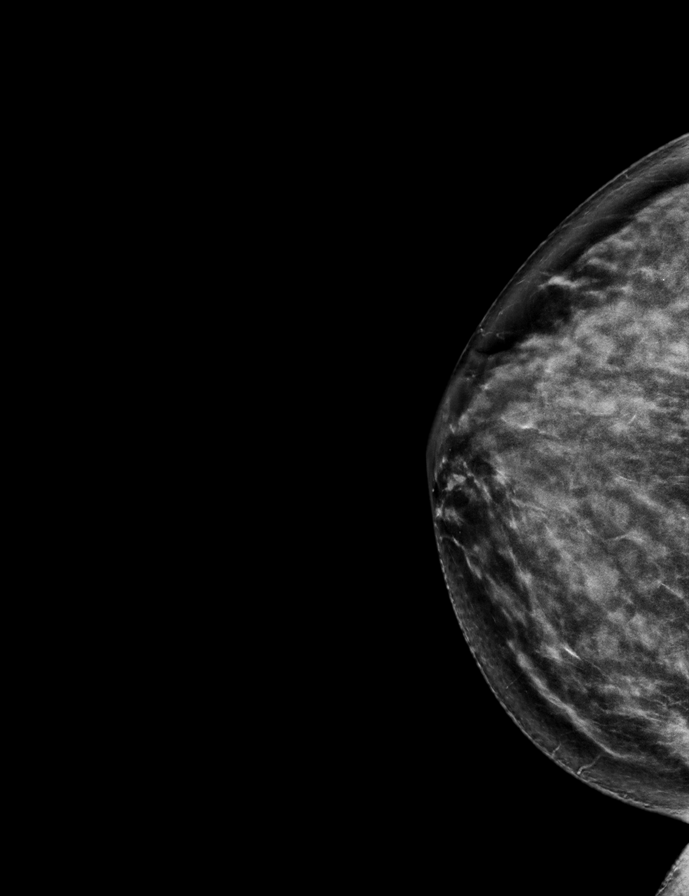

[R MLO tomo · tomo slice 35/70.0]
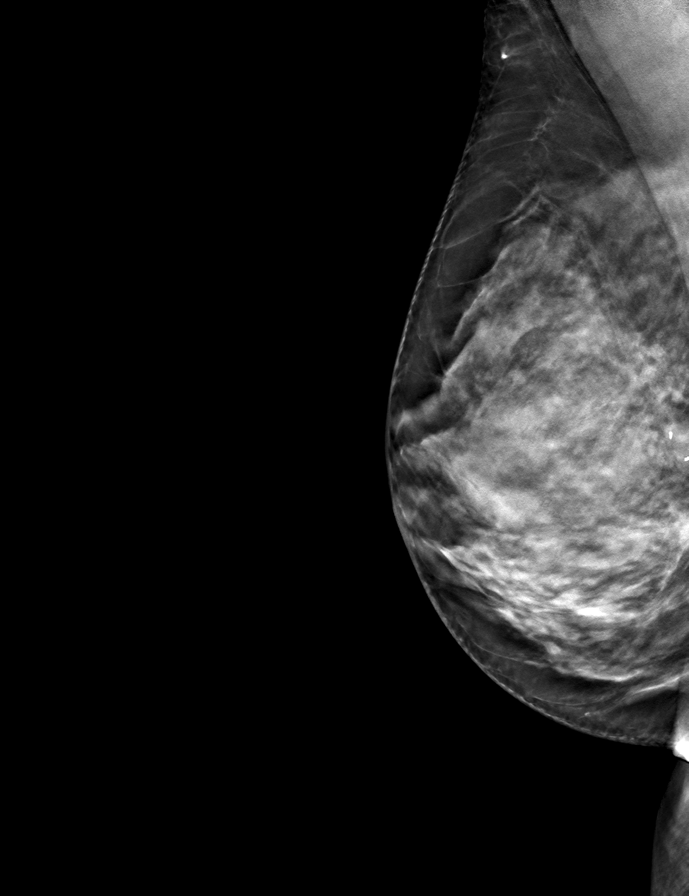

[8 of 27 positions shown; findings below may reference images not displayed]

ACR Breast Density Category d: The breast tissue is extremely dense,
which lowers the sensitivity of mammography.
FINDINGS: There is a mass with associated distortion in the far inner right
breast. This is confirmed on the spot compression CC tomograms and
not definitely seen on the spot compression MLO tomograms. There is
a surgical scar in the lower central to slightly outer right breast
and the initially seen distortion on the MLO view appears to
correspond with postsurgical change. The benign appearing dystrophic
calcifications in the upper right breast are stable.

Mammographic images were processed with CAD.

Physical examination of the upper slightly inner right breast
reveals an area of thickening at the approximate 2 o'clock position.

Targeted ultrasound of the right breast was performed demonstrating
an irregular hypoechoic mass at 2 o'clock 9 cm from nipple measuring
1.7 x 0.8 x 0.7 cm. This corresponds well with mammography findings.
There is an oval circumscribed hypoechoic mass in the right breast
at 1 o'clock 5 cm from the nipple with calcifications measuring
x 0.6 x 1.2 cm, likely corresponding with the probably benign
calcifications at mammography and representing a fibroadenoma.

No lymphadenopathy seen in the right axilla.
IMPRESSION: 1. Highly suspicious mass in the right breast at the 2 o'clock
position.

2. Right breast mass at the 1 o'clock position, possibly a
fibroadenoma however given the additional suspicious mass in the
right breast biopsy of this is warranted to proven benignity.

RECOMMENDATION:
1. Recommend ultrasound-guided biopsy of the irregular mass in the
right breast at the 2 o'clock position.

2. Recommend ultrasound-guided biopsy of the mass possibly
representing a fibroadenoma in the right breast at the 1 o'clock
position.

I have discussed the findings and recommendations with the patient.
Results were also provided in writing at the conclusion of the
visit. If applicable, a reminder letter will be sent to the patient
regarding the next appointment.

BI-RADS CATEGORY  5: Highly suggestive of malignancy.

## 2019-12-23 LAB — NOVEL CORONAVIRUS, NAA: SARS-CoV-2, NAA: NOT DETECTED

## 2020-01-02 ENCOUNTER — Other Ambulatory Visit: Payer: Self-pay | Admitting: Family Medicine

## 2020-01-02 NOTE — Telephone Encounter (Signed)
Left VM requesting pt to call me back, metformin said it was d/c by GYN on 10/17/19, called pt to see if she requested this refill or is this an error and the pharmacy sent [place] an auto refill, will await pt's return call

## 2020-01-02 NOTE — Telephone Encounter (Signed)
Rx declined  

## 2020-01-02 NOTE — Telephone Encounter (Signed)
Patient returned call. She stated that she is not sure why the GYN D/C this because they do not handle anything with her metformin. But she stated that she is not taking this medication and did not need a refill . To disregard refill request the pharmacy must have it on auto refill.

## 2020-01-31 DIAGNOSIS — G43019 Migraine without aura, intractable, without status migrainosus: Secondary | ICD-10-CM | POA: Diagnosis not present

## 2020-01-31 DIAGNOSIS — G43719 Chronic migraine without aura, intractable, without status migrainosus: Secondary | ICD-10-CM | POA: Diagnosis not present

## 2020-01-31 DIAGNOSIS — M542 Cervicalgia: Secondary | ICD-10-CM | POA: Diagnosis not present

## 2020-02-01 ENCOUNTER — Other Ambulatory Visit: Payer: Self-pay | Admitting: Family Medicine

## 2020-02-01 MED ORDER — ALPRAZOLAM 0.5 MG PO TABS: ORAL_TABLET | ORAL | 0 refills | Status: DC

## 2020-02-01 NOTE — Telephone Encounter (Signed)
Name of Medication:alprazolam 0.5 mg Name of Pharmacy: [facility] or Written Date and Quantity:# 15 on 11/27/19  Last Office Visit and Type: 11/03/19 acute; 10/04/19 DM FU and 01/03/19 annual Next Office Visit and Type: none scheduled   Pt has one pill left; Dr Glori Bickers is out of office until next wk.Please advise.

## 2020-02-01 NOTE — Telephone Encounter (Signed)
Patient is requesting a refill    Alprazolam (XANAX) 0.5 mg tablet    Patient has 1 tablet left     [facility] [place]

## 2020-02-01 NOTE — Telephone Encounter (Signed)
ERx 

## 2020-02-05 ENCOUNTER — Other Ambulatory Visit: Payer: Self-pay | Admitting: Family Medicine

## 2020-02-05 NOTE — Telephone Encounter (Signed)
Pt didn't want to wait until summer. She scheduled a physical for next month.

## 2020-02-05 NOTE — Telephone Encounter (Signed)
I left a detailed message on patient's voice mail to call back and schedule cpx or f/u this summer.

## 2020-02-05 NOTE — Telephone Encounter (Signed)
Med refilled once  

## 2020-02-05 NOTE — Telephone Encounter (Signed)
Last TSH lab was 12/30/18 and no future appts., since pt hasn't had a lab done in over a year will route to PCP for review

## 2020-02-05 NOTE — Telephone Encounter (Signed)
Please schedule PE or f/u this summer and refill until then  Thanks

## 2020-02-14 ENCOUNTER — Telehealth: Payer: Self-pay | Admitting: Family Medicine

## 2020-02-14 DIAGNOSIS — Z0000 Encounter for general adult medical examination without abnormal findings: Secondary | ICD-10-CM

## 2020-02-14 DIAGNOSIS — E039 Hypothyroidism, unspecified: Secondary | ICD-10-CM

## 2020-02-14 DIAGNOSIS — R7303 Prediabetes: Secondary | ICD-10-CM

## 2020-02-14 DIAGNOSIS — G43719 Chronic migraine without aura, intractable, without status migrainosus: Secondary | ICD-10-CM | POA: Diagnosis not present

## 2020-02-14 DIAGNOSIS — R7401 Elevation of levels of liver transaminase levels: Secondary | ICD-10-CM

## 2020-02-14 DIAGNOSIS — E538 Deficiency of other specified B group vitamins: Secondary | ICD-10-CM

## 2020-02-14 DIAGNOSIS — G43019 Migraine without aura, intractable, without status migrainosus: Secondary | ICD-10-CM | POA: Diagnosis not present

## 2020-02-14 DIAGNOSIS — M542 Cervicalgia: Secondary | ICD-10-CM | POA: Diagnosis not present

## 2020-02-14 NOTE — Telephone Encounter (Signed)
-----   Message from Ellamae Sia sent at 02/13/2020  3:54 PM EDT ----- Regarding: Lab orders for Monday, 4.5.21 Patient is scheduled for CPX labs, please order future labs, Thanks , [name]

## 2020-02-26 ENCOUNTER — Other Ambulatory Visit: Payer: BC Managed Care – PPO

## 2020-02-26 ENCOUNTER — Other Ambulatory Visit (INDEPENDENT_AMBULATORY_CARE_PROVIDER_SITE_OTHER): Payer: BC Managed Care – PPO

## 2020-02-26 ENCOUNTER — Other Ambulatory Visit: Payer: Self-pay

## 2020-02-26 DIAGNOSIS — Z0000 Encounter for general adult medical examination without abnormal findings: Secondary | ICD-10-CM

## 2020-02-26 DIAGNOSIS — E538 Deficiency of other specified B group vitamins: Secondary | ICD-10-CM | POA: Diagnosis not present

## 2020-02-26 DIAGNOSIS — R7303 Prediabetes: Secondary | ICD-10-CM

## 2020-02-26 DIAGNOSIS — E039 Hypothyroidism, unspecified: Secondary | ICD-10-CM

## 2020-02-26 LAB — CBC WITH DIFFERENTIAL/PLATELET
Basophils Absolute: 0.1 10*3/uL (ref 0.0–0.1)
Basophils Relative: 1 % (ref 0.0–3.0)
Eosinophils Absolute: 0.2 10*3/uL (ref 0.0–0.7)
Eosinophils Relative: 2.8 % (ref 0.0–5.0)
HCT: 36 % (ref 36.0–46.0)
Hemoglobin: 11.9 g/dL — ABNORMAL LOW (ref 12.0–15.0)
Lymphocytes Relative: 36.5 % (ref 12.0–46.0)
Lymphs Abs: 2 10*3/uL (ref 0.7–4.0)
MCHC: 33.1 g/dL (ref 30.0–36.0)
MCV: 81.2 fl (ref 78.0–100.0)
Monocytes Absolute: 0.5 10*3/uL (ref 0.1–1.0)
Monocytes Relative: 9.1 % (ref 3.0–12.0)
Neutro Abs: 2.8 10*3/uL (ref 1.4–7.7)
Neutrophils Relative %: 50.6 % (ref 43.0–77.0)
Platelets: 314 10*3/uL (ref 150.0–400.0)
RBC: 4.43 Mil/uL (ref 3.87–5.11)
RDW: 14.6 % (ref 11.5–15.5)
WBC: 5.5 10*3/uL (ref 4.0–10.5)

## 2020-02-26 LAB — LIPID PANEL
Cholesterol: 202 mg/dL — ABNORMAL HIGH (ref 0–200)
HDL: 59.9 mg/dL (ref >39.00)
LDL Cholesterol: 108 mg/dL — ABNORMAL HIGH (ref 0–99)
NonHDL: 142.09
Total CHOL/HDL Ratio: 3
Triglycerides: 169 mg/dL — ABNORMAL HIGH (ref 0.0–149.0)
VLDL: 33.8 mg/dL (ref 0.0–40.0)

## 2020-02-26 LAB — COMPREHENSIVE METABOLIC PANEL
ALT: 20 U/L (ref 0–35)
AST: 19 U/L (ref 0–37)
Albumin: 4.4 g/dL (ref 3.5–5.2)
Alkaline Phosphatase: 148 U/L — ABNORMAL HIGH (ref 39–117)
BUN: 16 mg/dL (ref 6–23)
CO2: 31 mEq/L (ref 19–32)
Calcium: 9.6 mg/dL (ref 8.4–10.5)
Chloride: 98 mEq/L (ref 96–112)
Creatinine, Ser: 0.72 mg/dL (ref 0.40–1.20)
GFR: 81.71 mL/min (ref >60.00)
Glucose, Bld: 102 mg/dL — ABNORMAL HIGH (ref 70–99)
Potassium: 4.3 mEq/L (ref 3.5–5.1)
Sodium: 136 mEq/L (ref 135–145)
Total Bilirubin: 0.5 mg/dL (ref 0.2–1.2)
Total Protein: 7.1 g/dL (ref 6.0–8.3)

## 2020-02-26 LAB — VITAMIN B12: Vitamin B-12: 1054 pg/mL — ABNORMAL HIGH (ref 211–911)

## 2020-02-26 LAB — HEMOGLOBIN A1C: Hgb A1c MFr Bld: 6.1 % (ref 4.6–6.5)

## 2020-02-26 LAB — TSH: TSH: 1.46 u[IU]/mL (ref 0.35–4.50)

## 2020-02-28 ENCOUNTER — Other Ambulatory Visit: Payer: Self-pay | Admitting: Family Medicine

## 2020-03-01 ENCOUNTER — Encounter: Payer: Self-pay | Admitting: Family Medicine

## 2020-03-01 ENCOUNTER — Ambulatory Visit (INDEPENDENT_AMBULATORY_CARE_PROVIDER_SITE_OTHER): Payer: BC Managed Care – PPO | Admitting: Family Medicine

## 2020-03-01 ENCOUNTER — Other Ambulatory Visit: Payer: Self-pay

## 2020-03-01 VITALS — BP 110/66 | HR 63 | Temp 97.5°F | Ht 60.0 in | Wt 133.0 lb

## 2020-03-01 DIAGNOSIS — E538 Deficiency of other specified B group vitamins: Secondary | ICD-10-CM | POA: Diagnosis not present

## 2020-03-01 DIAGNOSIS — Z0000 Encounter for general adult medical examination without abnormal findings: Secondary | ICD-10-CM

## 2020-03-01 DIAGNOSIS — G43109 Migraine with aura, not intractable, without status migrainosus: Secondary | ICD-10-CM

## 2020-03-01 DIAGNOSIS — R7303 Prediabetes: Secondary | ICD-10-CM

## 2020-03-01 DIAGNOSIS — Z853 Personal history of malignant neoplasm of breast: Secondary | ICD-10-CM | POA: Insufficient documentation

## 2020-03-01 DIAGNOSIS — M8580 Other specified disorders of bone density and structure, unspecified site: Secondary | ICD-10-CM

## 2020-03-01 DIAGNOSIS — E039 Hypothyroidism, unspecified: Secondary | ICD-10-CM | POA: Diagnosis not present

## 2020-03-01 NOTE — Assessment & Plan Note (Signed)
Lab Results  Component Value Date   HGBA1C 6.1 02/26/2020   This is up  Pt watches diet but needs to start exercise  She has metformin xr at home if she wants to try  Will continue to monitor

## 2020-03-01 NOTE — Assessment & Plan Note (Signed)
Currently taking letrozole  Sees oncology yearly or more utd mammogram  Enc self exams Also sees gyn

## 2020-03-01 NOTE — Assessment & Plan Note (Signed)
Hypothyroidism  Pt has no clinical changes No change in energy level/ hair or skin/ edema and no tremor Lab Results  Component Value Date   TSH 1.46 02/26/2020

## 2020-03-01 NOTE — Assessment & Plan Note (Signed)
Lab Results  Component Value Date   [id number] (H) 02/26/2020   inst to stop the extra B12 supplement/she no longer needs it

## 2020-03-01 NOTE — Assessment & Plan Note (Signed)
Overall doing better with botox treatment for prevention Pt does have a migraine today- improved much with generic maxalt

## 2020-03-01 NOTE — Progress Notes (Signed)
Subjective:    Patient ID: Jennifer [name], female    DOB: 1956/10/12, 64 y.o.   MRN: [id number]  This visit occurred during the SARS-CoV-2 public health emergency.  Safety protocols were in place, including screening questions prior to the visit, additional usage of staff PPE, and extensive cleaning of exam room while observing appropriate contact time as indicated for disinfecting solutions.    HPI Here for health maintenance exam and to review chronic medical problems    Wt Readings from Last 3 Encounters:  03/01/20 133 lb (60.3 kg)  11/23/19 130 lb 9.6 oz (59.2 kg)  11/06/19 125 lb (56.7 kg)   25.97 kg/m   Had a migraine today  Took a triptan this am - feels slowed down from this   Has not had as much energy with the breast cancer aromatase inhibitor Needs to exercise more  She has a work out room in home -plans to start it   Flu vaccine 9/20  Tdap 12/13 Zoster status - had shingrix vaccines  covid status - had both vaccines (last one was 4 weeks ago)   Mammogram 10/20  Personal h/o breast cancer -sees oncology  Self breast exam - does not do/ knows she needs to   Pap 12/19 -normal  Had her gyn visit 12/31  Had a hysterectomy (everything removed)   Colonoscopy 12/19   utd screen for HIV and hep C  BP Readings from Last 3 Encounters:  03/01/20 110/66  11/23/19 116/68  11/22/19 124/80   Pulse Readings from Last 3 Encounters:  03/01/20 63  11/06/19 68  11/03/19 62    Hypothyroidism  Pt has no clinical changes No change in energy level/ hair or skin/ edema and no tremor Lab Results  Component Value Date   TSH 1.46 02/26/2020     Osteopenia -followed by gyn  dexa 10/20  Took arimidex in the past -now on letrozole  Falls -none  Fractures - none  Supplements -good vitamin D and ca  Exercise -not lately-plans to start   B12 def Lab Results  Component Value Date   VITAMINB12 1,054 (H) 02/26/2020  she only takes her B12 3 d per week  May not need it    Prediabetes Lab Results  Component Value Date   HGBA1C 6.1 02/26/2020  metformin -has it and has not decided whether to take it  Not exercising  Eats well overall  Cholesterol Lab Results  Component Value Date   CHOL 202 (H) 02/26/2020   CHOL 198 12/30/2018   CHOL 153 11/08/2017   Lab Results  Component Value Date   HDL 59.90 02/26/2020   HDL 60.90 12/30/2018   HDL 54.40 11/08/2017   Lab Results  Component Value Date   LDLCALC 108 (H) 02/26/2020   LDLCALC 122 (H) 12/30/2018   LDLCALC 76 11/08/2017   Lab Results  Component Value Date   TRIG 169.0 (H) 02/26/2020   TRIG 74.0 12/30/2018   TRIG 114.0 11/08/2017   Lab Results  Component Value Date   CHOLHDL 3 02/26/2020   CHOLHDL 3 12/30/2018   CHOLHDL 3 11/08/2017   Lab Results  Component Value Date   LDLDIRECT 98.6 01/10/2014   LDLDIRECT 121.8 09/16/2010   Ratio is ok   Lab Results  Component Value Date   CREATININE 0.72 02/26/2020   BUN 16 02/26/2020   NA 136 02/26/2020   K 4.3 02/26/2020   CL 98 02/26/2020   CO2 31 02/26/2020   Lab Results  Component Value  Date   ALT 20 02/26/2020   AST 19 02/26/2020   ALKPHOS 148 (H) 02/26/2020   BILITOT 0.5 02/26/2020    Lab Results  Component Value Date   WBC 5.5 02/26/2020   HGB 11.9 (L) 02/26/2020   HCT 36.0 02/26/2020   MCV 81.2 02/26/2020   PLT 314.0 02/26/2020    Patient Active Problem List   Diagnosis Date Noted  . History of breast cancer 03/01/2020  . Genetic testing 11/14/2019  . Bilateral hand pain 11/03/2019  . Family history of prostate cancer   . Adnexal cyst 09/26/2019  . Cystitis with hematuria 09/26/2019  . Pain of left calf 03/21/2019  . Use of anastrozole (Arimidex) 03/21/2019  . Elevated transaminase level 01/03/2019  . Family history of breast cancer   . Stress reaction 09/26/2018  . Malignant neoplasm of upper-inner quadrant of right breast in female, estrogen receptor positive ([place]) 09/20/2018  . B12 deficiency 04/13/2018   . Joint pain 04/11/2018  . Paresthesia 04/11/2018  . Colon cancer screening 09/16/2017  . Prediabetes 07/28/2016  . Allergic rhinitis 11/13/2014  . Screening for lipoid disorders 09/26/2014  . Preventative health care 09/26/2014  . Routine general medical examination at a health care facility 10/28/2012  . Herpes labialis 04/28/2011  . GERD 09/16/2010  . Hypothyroidism 05/07/2008  . Osteopenia 05/07/2008  . Migraine with aura 11/11/2007   Past Medical History:  Diagnosis Date  . Allergic rhinitis   . Anxiety   . Arthritis    hands, knees  . Breast cancer ([place]) 2019   Right Breast Cancer  . Cancer ([place]) 09/2018   Breast CA new DX, right breast  . Cervical dysplasia   . Endometriosis   . Esophageal reflux   . Family history of adverse reaction to anesthesia    younger sister anxiety for a dew days after  . Family history of breast cancer   . Family history of breast cancer   . Family history of prostate cancer   . Hypothyroid   . Migraine with aura, without mention of intractable migraine without mention of status migrainosus   . Osteopenia 08/2019   T score -2.2 FRAX 11% / 1.7%  . Personal history of radiation therapy 2019   Right Breast Cancer  . Pre-diabetes    Past Surgical History:  Procedure Laterality Date  . BREAST EXCISIONAL BIOPSY Bilateral over 10 years ago   benign x 2   . BREAST LUMPECTOMY Right 10/10/2018  . BREAST LUMPECTOMY WITH RADIOACTIVE SEED AND SENTINEL LYMPH NODE BIOPSY Right 10/10/2018   Procedure: RIGHT BREAST LUMPECTOMY WITH RADIOACTIVE SEED AND RIGHT SENTINEL LYMPH NODE BIOPSY;  Surgeon: Jovita Kussmaul, MD;  Location: [facility];  Service: General;  Laterality: Right;  . BREAST SURGERY     Benign breast lump excised  . CERVICAL CONE BIOPSY  1985   severe dysplasia  . COLONOSCOPY  2005 and dec 2019   normal  . COLPOSCOPY    . endoscopy  10/2018  . LAPAROSCOPIC ASSISTED VAGINAL HYSTERECTOMY    . LAPAROSCOPIC BILATERAL  SALPINGO OOPHERECTOMY Bilateral 10/27/2019   Procedure: LAPAROSCOPIC BILATERAL SALPINGO OOPHORECTOMY WITH PERITONEAL WASHINGS;  Surgeon: Princess Bruins, MD;  Location: [facility];  Service: Gynecology;  Laterality: Bilateral;  request 1:00pm on Friday, Dec. 4th in [place] time held requests one hour OR time  . moles removed from upper back, face lft, 1 between breasts, upper leg left inside  10/18/2019   wearing small round bandaids on  for 2 weeks  . ROTATOR CUFF REPAIR Bilateral 08/2008   Social History   Tobacco Use  . Smoking status: Never Smoker  . Smokeless tobacco: Never Used  Substance Use Topics  . Alcohol use: No    Alcohol/week: 0.0 standard drinks  . Drug use: No   Family History  Problem Relation Age of Onset  . Breast cancer Mother 13  . Diabetes Mother   . Hypertension Father   . Heart disease Father   . Osteoporosis Maternal Grandmother   . Diabetes Maternal Grandmother   . Hypertension Sister   . COPD Paternal Grandmother   . Heart disease Paternal Grandfather   . Breast cancer Cousin 24       pat first cousin  . Breast cancer Cousin        mat second cousin with breast cancer in her 51s  . Prostate cancer Other 20  . Thyroid cancer Cousin 93       pat first cousin   Allergies  Allergen Reactions  . Ciprofloxacin Other (See Comments)    Dizziness   . Codeine Phosphate Other (See Comments)    Dizziness, heart palpitations, nervous  . Decongestant [Pseudoephedrine] Other (See Comments)    dizziness  . Flonase [Fluticasone Propionate] Other (See Comments)    Worsens her migraine   . Hydrocodone Other (See Comments)    Made nervous--10/09 surgery rotator cuff  . Reglan [Metoclopramide] Other (See Comments)    Dizziness  . Topamax [Topiramate] Other (See Comments)    Dizziness   . Benadryl [Diphenhydramine Hcl] Palpitations  . Sulfa Antibiotics Anxiety   Current Outpatient Medications on File Prior to Visit   Medication Sig Dispense Refill  . ALPRAZolam (XANAX) 0.5 MG tablet TAKE 1/2 TO 1 TABLET BY MOUTH ONCE DAILY AS NEEDED FOR SLEEP/ANXIETY 15 tablet 0  . atenolol (TENORMIN) 25 MG tablet Take 12.5 mg by mouth 3 (three) times daily. For migraine prevention    . Calcium Carbonate-Vitamin D 600-400 MG-UNIT tablet Take 2 tablets by mouth daily. Takes 2 in am 1 in pm    . cyanocobalamin 2000 MCG tablet Take 2,000 mcg by mouth. 3 x week fri, sat, sunday    . famotidine (PEPCID) 40 MG tablet Take 40 mg by mouth 2 (two) times daily.     Marland Kitchen glucose blood (TRUE METRIX BLOOD GLUCOSE TEST) test strip Use to check blood sugar two times a day. 100 each 6  . hydrOXYzine (ATARAX/VISTARIL) 25 MG tablet Take 1 tablet (25 mg total) by mouth 3 (three) times daily as needed. (Patient taking differently: Take 25 mg by mouth 2 (two) times daily. ) 30 tablet 0  . ibuprofen (ADVIL) 600 MG tablet Take 1 tablet (600 mg total) by mouth every 6 (six) hours as needed. 20 tablet 0  . ketorolac (TORADOL) 10 MG tablet Take 1 tablet (10 mg total) by mouth every 6 (six) hours as needed. (Patient taking differently: Take 10 mg by mouth every 6 (six) hours as needed for moderate pain. ) 20 tablet 0  . Lancets (ACCU-CHEK MULTICLIX) lancets Use to check blood sugar two times a day. 100 each 6  . letrozole (FEMARA) 2.5 MG tablet Take 2.5 mg by mouth daily.    Marland Kitchen levocetirizine (XYZAL) 5 MG tablet Take 1 tablet by mouth every evening.  3  . levothyroxine (SYNTHROID) 75 MCG tablet TAKE ONE TABLET BY MOUTH ONE TIME DAILY  before breakfast 30 tablet 5  . Magnesium Oxide 400 (240 Mg) MG  TABS Take by mouth.    . Misc Natural Products (GLUCOSAMINE CHOND COMPLEX/MSM) TABS Take 1 tablet by mouth daily.     . Multiple Vitamin (MULTIVITAMIN WITH MINERALS) TABS tablet Take 1 tablet by mouth daily.    . nabumetone (RELAFEN) 500 MG tablet Take 500 mg by mouth daily as needed (migraines).     . nystatin-triamcinolone (MYCOLOG II) cream Apply 1 application  topically 2 (two) times daily.    . Omega-3 Fatty Acids (FISH OIL) 1000 MG CAPS Take 1,000 mg by mouth daily.     . OnabotulinumtoxinA (BOTOX IJ) Inject 1 Dose as directed every 6 (six) weeks.     . ondansetron (ZOFRAN ODT) 4 MG disintegrating tablet Take 1 tablet (4 mg total) by mouth every 8 (eight) hours as needed. 15 tablet 0  . pantoprazole (PROTONIX) 40 MG tablet TAKE ONE TABLET BY MOUTH ONE TIME DAILY  90 tablet 1  . Probiotic Product (PROBIOTIC-10 PO) Take 1 capsule by mouth daily.     . promethazine (PHENERGAN) 25 MG tablet Take 12.5 mg by mouth daily as needed for nausea or vomiting. When having a migraine    . TURMERIC PO Take 1 tablet by mouth daily.     . rizatriptan (MAXALT) 10 MG tablet Take 1 tablet (10 mg total) by mouth once as needed for up to 10 days for migraine. May repeat in 2 hours if needed 10 tablet 0   No current facility-administered medications on file prior to visit.     Review of Systems  Constitutional: Negative for activity change, appetite change, fatigue, fever and unexpected weight change.  HENT: Negative for congestion, ear pain, rhinorrhea, sinus pressure and sore throat.   Eyes: Negative for pain, redness and visual disturbance.  Respiratory: Negative for cough, shortness of breath and wheezing.   Cardiovascular: Negative for chest pain and palpitations.  Gastrointestinal: Negative for abdominal pain, blood in stool, constipation and diarrhea.  Endocrine: Negative for polydipsia and polyuria.  Genitourinary: Negative for dysuria, frequency and urgency.  Musculoskeletal: Positive for arthralgias. Negative for back pain and myalgias.  Skin: Negative for pallor and rash.  Allergic/Immunologic: Negative for environmental allergies.  Neurological: Positive for headaches. Negative for dizziness and syncope.  Hematological: Negative for adenopathy. Does not bruise/bleed easily.  Psychiatric/Behavioral: Negative for decreased concentration and dysphoric mood.  The patient is not nervous/anxious.        Objective:   Physical Exam Constitutional:      General: She is not in acute distress.    Appearance: Normal appearance. She is well-developed. She is not ill-appearing or diaphoretic.  HENT:     Head: Normocephalic and atraumatic.     Right Ear: Tympanic membrane, ear canal and external ear normal.     Left Ear: Tympanic membrane, ear canal and external ear normal.     Nose: Nose normal. No congestion.     Mouth/Throat:     Mouth: Mucous membranes are moist.     Pharynx: Oropharynx is clear. No posterior oropharyngeal erythema.  Eyes:     General: No scleral icterus.    Extraocular Movements: Extraocular movements intact.     Conjunctiva/sclera: Conjunctivae normal.     Pupils: Pupils are equal, round, and reactive to light.  Neck:     Thyroid: No thyromegaly.     Vascular: No carotid bruit or JVD.  Cardiovascular:     Rate and Rhythm: Normal rate and regular rhythm.     Pulses: Normal pulses.  Heart sounds: Normal heart sounds. No gallop.   Pulmonary:     Effort: Pulmonary effort is normal. No respiratory distress.     Breath sounds: Normal breath sounds. No wheezing.     Comments: Good air exch Chest:     Chest wall: No tenderness.  Abdominal:     General: Bowel sounds are normal. There is no distension or abdominal bruit.     Palpations: Abdomen is soft. There is no mass.     Tenderness: There is no abdominal tenderness.     Hernia: No hernia is present.  Genitourinary:    Comments: Breast exam: No mass, nodules, thickening, tenderness, bulging, retraction, inflamation, nipple discharge or skin changes noted.  No axillary or clavicular LA.     Baseline scarring on R breast from lumpectomy Musculoskeletal:        General: No tenderness. Normal range of motion.     Cervical back: Normal range of motion and neck supple. No rigidity. No muscular tenderness.     Right lower leg: No edema.     Left lower leg: No edema.      Comments: No kyphosis   Lymphadenopathy:     Cervical: No cervical adenopathy.  Skin:    General: Skin is warm and dry.     Coloration: Skin is not pale.     Findings: No erythema or rash.     Comments: Few lentigines   Neurological:     Mental Status: She is alert. Mental status is at baseline.     Cranial Nerves: No cranial nerve deficit.     Motor: No abnormal muscle tone.     Coordination: Coordination normal.     Gait: Gait normal.     Deep Tendon Reflexes: Reflexes are normal and symmetric.  Psychiatric:        Mood and Affect: Mood normal.        Cognition and Memory: Cognition and memory normal.           Assessment & Plan:   Problem List Items Addressed This Visit      Cardiovascular and Mediastinum   Migraine with aura    Overall doing better with botox treatment for prevention Pt does have a migraine today- improved much with generic maxalt        Endocrine   Hypothyroidism    Hypothyroidism  Pt has no clinical changes No change in energy level/ hair or skin/ edema and no tremor Lab Results  Component Value Date   TSH 1.46 02/26/2020            Musculoskeletal and Integument   Osteopenia    dexa 10/20  Taking letrizole for breast cancer No falls or fx On ca and D Watched by gyn        Other   Routine general medical examination at a health care facility - Primary    Reviewed health habits including diet and exercise and skin cancer prevention Reviewed appropriate screening tests for age  Also reviewed health mt list, fam hx and immunization status , as well as social and family history   See HPI Labs reviewed  Enc more exercise  Had shingrix vaccines and covid vaccines  Sees gyn yearly  Keeps up with oncology for breast cancer          Prediabetes    Lab Results  Component Value Date   HGBA1C 6.1 02/26/2020   This is up  Pt watches diet but needs to start exercise  She has metformin xr at home if she wants to try  Will  continue to monitor      B12 deficiency    Lab Results  Component Value Date   VITAMINB12 1,054 (H) 02/26/2020   inst to stop the extra B12 supplement/she no longer needs it       History of breast cancer    Currently taking letrozole  Sees oncology yearly or more utd mammogram  Enc self exams Also sees gyn

## 2020-03-01 NOTE — Patient Instructions (Addendum)
Stop your extra B12  I don't think you need it   Try metformin if you want to  Keep up a healthy diet Start adding back exercise  Wear your sunscreen   Keep up with breast cancer follow up

## 2020-03-01 NOTE — Assessment & Plan Note (Signed)
dexa 10/20  Taking letrizole for breast cancer No falls or fx On ca and D Watched by gyn

## 2020-03-01 NOTE — Assessment & Plan Note (Signed)
Reviewed health habits including diet and exercise and skin cancer prevention Reviewed appropriate screening tests for age  Also reviewed health mt list, fam hx and immunization status , as well as social and family history   See HPI Labs reviewed  Enc more exercise  Had shingrix vaccines and covid vaccines  Sees gyn yearly  Keeps up with oncology for breast cancer

## 2020-03-06 DIAGNOSIS — G43719 Chronic migraine without aura, intractable, without status migrainosus: Secondary | ICD-10-CM | POA: Diagnosis not present

## 2020-03-14 DIAGNOSIS — G43719 Chronic migraine without aura, intractable, without status migrainosus: Secondary | ICD-10-CM | POA: Diagnosis not present

## 2020-03-14 DIAGNOSIS — M542 Cervicalgia: Secondary | ICD-10-CM | POA: Diagnosis not present

## 2020-03-14 DIAGNOSIS — G43019 Migraine without aura, intractable, without status migrainosus: Secondary | ICD-10-CM | POA: Diagnosis not present

## 2020-04-04 DIAGNOSIS — G43719 Chronic migraine without aura, intractable, without status migrainosus: Secondary | ICD-10-CM | POA: Diagnosis not present

## 2020-04-04 DIAGNOSIS — G43019 Migraine without aura, intractable, without status migrainosus: Secondary | ICD-10-CM | POA: Diagnosis not present

## 2020-04-04 DIAGNOSIS — M542 Cervicalgia: Secondary | ICD-10-CM | POA: Diagnosis not present

## 2020-04-19 ENCOUNTER — Other Ambulatory Visit: Payer: Self-pay | Admitting: Family Medicine

## 2020-04-19 MED ORDER — ALPRAZOLAM 0.5 MG PO TABS: ORAL_TABLET | ORAL | 0 refills | Status: DC

## 2020-04-19 NOTE — Telephone Encounter (Signed)
Name of Medication: Alprazolam 0.5 mg  Name of Pharmacy:[facility] or [facility] Date and Quantity: # 15 on 02/01/20 Last Office Visit and Type: 03/01/20 annual Next Office Visit and Type:none scheduled

## 2020-04-19 NOTE — Telephone Encounter (Signed)
Patient called requesting a refill   ALPRAZolam (XANAX) 0.5 MG tablet   Patient stated she has 1 tablet   [facility]

## 2020-04-26 DIAGNOSIS — G43719 Chronic migraine without aura, intractable, without status migrainosus: Secondary | ICD-10-CM | POA: Diagnosis not present

## 2020-04-26 DIAGNOSIS — M542 Cervicalgia: Secondary | ICD-10-CM | POA: Diagnosis not present

## 2020-04-26 DIAGNOSIS — G43019 Migraine without aura, intractable, without status migrainosus: Secondary | ICD-10-CM | POA: Diagnosis not present

## 2020-05-02 ENCOUNTER — Emergency Department
Admission: EM | Admit: 2020-05-02 | Discharge: 2020-05-02 | Disposition: A | Discharge status: Home / Self Care | Payer: BC Managed Care – PPO | Attending: Emergency Medicine | Admitting: Emergency Medicine

## 2020-05-02 ENCOUNTER — Emergency Department: Payer: BC Managed Care – PPO

## 2020-05-02 ENCOUNTER — Encounter: Payer: Self-pay | Admitting: Emergency Medicine

## 2020-05-02 ENCOUNTER — Other Ambulatory Visit: Payer: Self-pay

## 2020-05-02 DIAGNOSIS — M5136 Other intervertebral disc degeneration, lumbar region: Secondary | ICD-10-CM | POA: Diagnosis not present

## 2020-05-02 DIAGNOSIS — Z853 Personal history of malignant neoplasm of breast: Secondary | ICD-10-CM | POA: Diagnosis not present

## 2020-05-02 DIAGNOSIS — E039 Hypothyroidism, unspecified: Secondary | ICD-10-CM | POA: Insufficient documentation

## 2020-05-02 DIAGNOSIS — Z79899 Other long term (current) drug therapy: Secondary | ICD-10-CM | POA: Insufficient documentation

## 2020-05-02 DIAGNOSIS — M545 Low back pain: Secondary | ICD-10-CM | POA: Diagnosis not present

## 2020-05-02 DIAGNOSIS — M5441 Lumbago with sciatica, right side: Secondary | ICD-10-CM

## 2020-05-02 MED ORDER — ONDANSETRON 4 MG PO TBDP
4.0000 mg | ORAL_TABLET | Freq: Once | ORAL | Status: AC
  Administered 2020-05-02: 4 mg via ORAL

## 2020-05-02 MED ORDER — DEXAMETHASONE SODIUM PHOSPHATE 10 MG/ML IJ SOLN
10.0000 mg | Freq: Once | INTRAMUSCULAR | Status: AC
  Administered 2020-05-02: 10 mg via INTRAMUSCULAR
  Filled 2020-05-02: qty 1

## 2020-05-02 MED ORDER — ONDANSETRON 4 MG PO TBDP
ORAL_TABLET | ORAL | Status: AC
  Filled 2020-05-02: qty 1

## 2020-05-02 NOTE — ED Provider Notes (Signed)
[facility] Emergency Department Provider Note ____________________________________________  Time seen: 2110  I have reviewed the triage vital signs and the nursing notes.  HISTORY  Chief Complaint  Back Pain   HPI Jennifer [name] is a 64 y.o. female presents to the ER with complaint of right lower back pain. She reports this started yesterday after moving some boxes at work. She describes the pain as sore and achy. The pain radiates into her right buttock. She denies pain that radiates into her right leg. She denies numbness, tingling or weakness. She denies loss of bowel or bladder control. She denies any injury to the back or previous back surgeries. She reports history of sciatica on the right side but has never had this evaluated with imaging. She has tried baclofen and Voltaren gel with minimal relief of symptoms.  Past Medical History:  Diagnosis Date  . Allergic rhinitis   . Anxiety   . Arthritis    hands, knees  . Breast cancer ([place]) 2019   Right Breast Cancer  . Cancer ([place]) 09/2018   Breast CA new DX, right breast  . Cervical dysplasia   . Endometriosis   . Esophageal reflux   . Family history of adverse reaction to anesthesia    younger sister anxiety for a dew days after  . Family history of breast cancer   . Family history of breast cancer   . Family history of prostate cancer   . Hypothyroid   . Migraine with aura, without mention of intractable migraine without mention of status migrainosus   . Osteopenia 08/2019   T score -2.2 FRAX 11% / 1.7%  . Personal history of radiation therapy 2019   Right Breast Cancer  . Pre-diabetes     Patient Active Problem List   Diagnosis Date Noted  . History of breast cancer 03/01/2020  . Genetic testing 11/14/2019  . Bilateral hand pain 11/03/2019  . Family history of prostate cancer   . Adnexal cyst 09/26/2019  . Cystitis with hematuria 09/26/2019  . Pain of left calf 03/21/2019  . Use of  anastrozole (Arimidex) 03/21/2019  . Elevated transaminase level 01/03/2019  . Family history of breast cancer   . Stress reaction 09/26/2018  . Malignant neoplasm of upper-inner quadrant of right breast in female, estrogen receptor positive ([place]) 09/20/2018  . B12 deficiency 04/13/2018  . Joint pain 04/11/2018  . Paresthesia 04/11/2018  . Colon cancer screening 09/16/2017  . Prediabetes 07/28/2016  . Allergic rhinitis 11/13/2014  . Screening for lipoid disorders 09/26/2014  . Preventative health care 09/26/2014  . Routine general medical examination at a health care facility 10/28/2012  . Herpes labialis 04/28/2011  . GERD 09/16/2010  . Hypothyroidism 05/07/2008  . Osteopenia 05/07/2008  . Migraine with aura 11/11/2007    Past Surgical History:  Procedure Laterality Date  . BREAST EXCISIONAL BIOPSY Bilateral over 10 years ago   benign x 2   . BREAST LUMPECTOMY Right 10/10/2018  . BREAST LUMPECTOMY WITH RADIOACTIVE SEED AND SENTINEL LYMPH NODE BIOPSY Right 10/10/2018   Procedure: RIGHT BREAST LUMPECTOMY WITH RADIOACTIVE SEED AND RIGHT SENTINEL LYMPH NODE BIOPSY;  Surgeon: Jovita Kussmaul, MD;  Location: [facility];  Service: General;  Laterality: Right;  . BREAST SURGERY     Benign breast lump excised  . CERVICAL CONE BIOPSY  1985   severe dysplasia  . COLONOSCOPY  2005 and dec 2019   normal  . COLPOSCOPY    . endoscopy  10/2018  .  LAPAROSCOPIC ASSISTED VAGINAL HYSTERECTOMY    . LAPAROSCOPIC BILATERAL SALPINGO OOPHERECTOMY Bilateral 10/27/2019   Procedure: LAPAROSCOPIC BILATERAL SALPINGO OOPHORECTOMY WITH PERITONEAL WASHINGS;  Surgeon: Princess Bruins, MD;  Location: [facility];  Service: Gynecology;  Laterality: Bilateral;  request 1:00pm on Friday, Dec. 4th in [place] time held requests one hour OR time  . moles removed from upper back, face lft, 1 between breasts, upper leg left inside  10/18/2019   wearing small round bandaids  on for 2 weeks  . ROTATOR CUFF REPAIR Bilateral 08/2008    Prior to Admission medications   Medication Sig Start Date End Date Taking? Authorizing Provider  ALPRAZolam (XANAX) 0.5 MG tablet TAKE 1/2 TO 1 TABLET BY MOUTH ONCE DAILY AS NEEDED FOR SLEEP/ANXIETY 04/19/20   Tower, [name], MD  atenolol (TENORMIN) 25 MG tablet Take 12.5 mg by mouth 3 (three) times daily. For migraine prevention    [provider]  Calcium Carbonate-Vitamin D 600-400 MG-UNIT tablet Take 2 tablets by mouth daily. Takes 2 in am 1 in pm    [provider]  cyanocobalamin 2000 MCG tablet Take 2,000 mcg by mouth. 3 x week fri, sat, sunday    [provider]  famotidine (PEPCID) 40 MG tablet Take 40 mg by mouth 2 (two) times daily.     [provider]  glucose blood (TRUE METRIX BLOOD GLUCOSE TEST) test strip Use to check blood sugar two times a day. 03/15/19   Tower, [name], MD  hydrOXYzine (ATARAX/VISTARIL) 25 MG tablet Take 1 tablet (25 mg total) by mouth 3 (three) times daily as needed. Patient taking differently: Take 25 mg by mouth 2 (two) times daily.  10/18/17   Triplett, Johnette Abraham B, FNP  ibuprofen (ADVIL) 600 MG tablet Take 1 tablet (600 mg total) by mouth every 6 (six) hours as needed. 10/27/19   Princess Bruins, MD  ketorolac (TORADOL) 10 MG tablet Take 1 tablet (10 mg total) by mouth every 6 (six) hours as needed. Patient taking differently: Take 10 mg by mouth every 6 (six) hours as needed for moderate pain.  06/07/17   Gregor Hams, MD  Lancets (ACCU-CHEK MULTICLIX) lancets Use to check blood sugar two times a day. 03/20/19   Tower, [name], MD  letrozole [facility]) 2.5 MG tablet Take 2.5 mg by mouth daily.    [provider]  levocetirizine (XYZAL) 5 MG tablet Take 1 tablet by mouth every evening. 10/28/17   [provider]  levothyroxine (SYNTHROID) 75 MCG tablet TAKE ONE TABLET BY MOUTH ONE TIME DAILY  before breakfast 02/28/20   Tower, [name], MD  Magnesium  Oxide 400 (240 Mg) MG TABS Take by mouth.    [provider]  Misc Natural Products (GLUCOSAMINE CHOND COMPLEX/MSM) TABS Take 1 tablet by mouth daily.     [provider]  Multiple Vitamin (MULTIVITAMIN WITH MINERALS) TABS tablet Take 1 tablet by mouth daily.    [provider]  nabumetone (RELAFEN) 500 MG tablet Take 500 mg by mouth daily as needed (migraines).     [provider]  nystatin-triamcinolone (MYCOLOG II) cream Apply 1 application topically 2 (two) times daily.    [provider]  Omega-3 Fatty Acids (FISH OIL) 1000 MG CAPS Take 1,000 mg by mouth daily.     [provider]  OnabotulinumtoxinA (BOTOX IJ) Inject 1 Dose as directed every 6 (six) weeks.     [provider]  ondansetron (ZOFRAN ODT) 4 MG  disintegrating tablet Take 1 tablet (4 mg total) by mouth every 8 (eight) hours as needed. 11/03/18   Menshew, Dannielle Karvonen, PA-C  pantoprazole (PROTONIX) 40 MG tablet TAKE ONE TABLET BY MOUTH ONE TIME DAILY  11/27/19   Tower, [name], MD  Probiotic Product (PROBIOTIC-10 PO) Take 1 capsule by mouth daily.     [provider]  promethazine (PHENERGAN) 25 MG tablet Take 12.5 mg by mouth daily as needed for nausea or vomiting. When having a migraine    [provider]  rizatriptan (MAXALT) 10 MG tablet Take 1 tablet (10 mg total) by mouth once as needed for up to 10 days for migraine. May repeat in 2 hours if needed 11/03/18 10/25/19  Menshew, Dannielle Karvonen, PA-C  TURMERIC PO Take 1 tablet by mouth daily.     [provider]    Allergies Ciprofloxacin, Codeine phosphate, Decongestant [pseudoephedrine], Flonase [fluticasone propionate], Hydrocodone, Reglan [metoclopramide], Topamax [topiramate], Benadryl [diphenhydramine hcl], and Sulfa antibiotics  Family History  Problem Relation Age of Onset  . Breast cancer Mother 37  . Diabetes Mother   . Hypertension Father   . Heart disease Father   .  Osteoporosis Maternal Grandmother   . Diabetes Maternal Grandmother   . Hypertension Sister   . COPD Paternal Grandmother   . Heart disease Paternal Grandfather   . Breast cancer Cousin 29       pat first cousin  . Breast cancer Cousin        mat second cousin with breast cancer in her 25s  . Prostate cancer Other 47  . Thyroid cancer Cousin 25       pat first cousin    Social History Social History   Tobacco Use  . Smoking status: Never Smoker  . Smokeless tobacco: Never Used  Vaping Use  . Vaping Use: Never used  Substance Use Topics  . Alcohol use: No    Alcohol/week: 0.0 standard drinks  . Drug use: No    Review of Systems  Constitutional: Negative for fever, chills or body aches. Cardiovascular: Negative for chest pain or chest tightness. Respiratory: Negative for cough or shortness of breath. Gastrointestinal: Negative for abdominal pain, loss of bowel control. Genitourinary: Negative for loss of bladder control. Musculoskeletal: Positive for back pain. Negative for hip, knee or ankle pain. Skin: Negative for rash. Neurological: Negative for headaches, focal weakness, tingling or numbness. ____________________________________________  PHYSICAL EXAM:  VITAL SIGNS: ED Triage Vitals  Enc Vitals Group     BP 05/02/20 1943 (!) 135/120     Pulse Rate 05/02/20 1943 72     Resp 05/02/20 1943 16     Temp 05/02/20 1943 98.6 F (37 C)     Temp Source 05/02/20 1943 Oral     SpO2 05/02/20 1943 98 %     Weight 05/02/20 1943 130 lb (59 kg)     Height 05/02/20 1943 5\' 1"  (1.549 m)     Head Circumference --      Peak Flow --      Pain Score 05/02/20 1959 3     Pain Loc --      Pain Edu? --      Excl. in [place]? --     Constitutional: Alert and oriented. Appears in pain, but in no distress. Head: Normocephalic and atraumatic. Eyes: Conjunctivae are normal. PERRL. Normal extraocular movements Cardiovascular: Normal rate. Pedal pulses 2+ bilaterally Respiratory: Normal  respiratory effort. No wheezes/rales/rhonchi. Musculoskeletal: Normal flexion, extension and  rotation of the lumbar spine. Bony tenderness noted over the lumbar spine. Strength 5/5 BLE. Able to stand on heels and toes. Gait slow and steady without device. Neurologic: Positive SLR on the right.. Normal speech and language. No gross focal neurologic deficits are appreciated. Skin:  Skin is warm, dry and intact. No rash noted. ___________________________________   RADIOLOGY   Imaging Orders     DG Lumbar Spine 2-3 Views IMPRESSION:  1. No acute osseous abnormality.  2. Mild multilevel degenerative disc disease and facet degenerative  change, maximal L5-S1.  3. Transitional lumbosacral anatomy with a partially lumbarized S1  level.    ____________________________________________   INITIAL IMPRESSION / ASSESSMENT AND PLAN / ED COURSE  Acute Low Back Pain with Right Sided Sciatica:  Xray lumbar spine negative for acute findings Decadron 10 mg IM x 1 Zofran ODT 4 mg PO x 1 She declines RX for antiinflammatories, muscle relaxers or pain medication at discharge Encouraged stretching and ice   __________________________  FINAL CLINICAL IMPRESSION(S) / ED DIAGNOSES  Final diagnoses:  Acute right-sided low back pain with right-sided sciatica      Jearld Fenton, NP 05/02/20 2223    Nance Pear, MD 05/02/20 2225

## 2020-05-02 NOTE — Discharge Instructions (Addendum)
You were seen today for acute low back pain with right-sided sciatica.  Your x-ray shows some arthritis but no bulging or herniated disc.  You received a steroid injection in the ER.  You can take Tylenol or ibuprofen every 8 hours as needed for pain.  Ice for 10 minutes twice daily and stretching as needed for comfort.  Please follow-up with your PCP for further evaluation if symptoms persist or worsen.

## 2020-05-02 NOTE — ED Triage Notes (Signed)
Patient ambulatory to triage with steady gait, without difficulty or distress noted, mask in place; pt reports lower back/rt buttock pain after moving yesterday; st hx sciatica; denies accomp symptoms

## 2020-05-09 DIAGNOSIS — G43019 Migraine without aura, intractable, without status migrainosus: Secondary | ICD-10-CM | POA: Diagnosis not present

## 2020-05-09 DIAGNOSIS — G43719 Chronic migraine without aura, intractable, without status migrainosus: Secondary | ICD-10-CM | POA: Diagnosis not present

## 2020-05-09 DIAGNOSIS — M542 Cervicalgia: Secondary | ICD-10-CM | POA: Diagnosis not present

## 2020-05-28 ENCOUNTER — Other Ambulatory Visit: Payer: Self-pay

## 2020-05-28 ENCOUNTER — Ambulatory Visit (INDEPENDENT_AMBULATORY_CARE_PROVIDER_SITE_OTHER): Payer: BC Managed Care – PPO | Admitting: Family Medicine

## 2020-05-28 ENCOUNTER — Encounter: Payer: Self-pay | Admitting: Family Medicine

## 2020-05-28 ENCOUNTER — Other Ambulatory Visit: Payer: Self-pay | Admitting: Family Medicine

## 2020-05-28 VITALS — BP 120/70 | HR 113 | Temp 96.9°F | Ht 61.0 in | Wt 130.3 lb

## 2020-05-28 DIAGNOSIS — K219 Gastro-esophageal reflux disease without esophagitis: Secondary | ICD-10-CM | POA: Diagnosis not present

## 2020-05-28 MED ORDER — ESOMEPRAZOLE MAGNESIUM 40 MG PO CPDR: 40.0000 mg | DELAYED_RELEASE_CAPSULE | Freq: Every day | ORAL | 3 refills | Status: DC

## 2020-05-28 NOTE — Patient Instructions (Addendum)
Call your pharmacy or ins co and see if Nexium 40 mg is covered (here is paper px to see) Keep watching diet  Continue current medications for now  Avoid nsaids as you have done   Elevate head of bed if helpful   In a severe flare- try a teaspoon of baking soda in a cup of water   I placed a GI referral -they will call you

## 2020-05-28 NOTE — Assessment & Plan Note (Signed)
Worse lately with inc in stress  No changes in diet  Takes protonix 40 mg daily (insurance stopped paying for nexium which she preferred) Also taking pepcid bid  Rev last note [facility] / also EGD report -showing mild gerd and gastritis with neg bx  She wishes to change practices Ref made to [facility] for further eval Enc her to continue elevating head of bed and avoid eating late Handout given Px nexium to see if ins would pay-she will check on that

## 2020-05-28 NOTE — Progress Notes (Signed)
Subjective:    Patient ID: Jennifer [name], female    DOB: [birth year], 64 y.o.   MRN: [id number]  This visit occurred during the SARS-CoV-2 public health emergency.  Safety protocols were in place, including screening questions prior to the visit, additional usage of staff PPE, and extensive cleaning of exam room while observing appropriate contact time as indicated for disinfecting solutions.    HPI Pt presents to discuss worsening GERD symptoms   Wt Readings from Last 3 Encounters:  05/28/20 130 lb 4.8 oz (59.1 kg)  05/02/20 130 lb (59 kg)  03/01/20 133 lb (60.3 kg)   24.62 kg/m  Per pt she weighs more than usual She eats clean  Avoids spicy food   GERD- worse lately  Heartburn  Tingling/discomfort in chest Had it worse after eating fish  Mild and shredded wheat seems to help  Acid does not come up in throat   Has allergies- clears throat  Not hoarse  occ nausea   Allergic to kiwi- she avoids it  Avoids spicy food   Today she feels a little better (mild reflux)   nsaid use -not used in long time / has for [facility]   Had endoscopy last year (she had at same time as colonoscopy) [facility]  Had mild reflux and gastritis, fundic gland polyps and no H pylori or barretts or dysplasia or signs of celiac (report rev today)  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 If side effect   Patient Active Problem List   Diagnosis Date Noted  . History of breast cancer 03/01/2020  . Genetic testing 11/14/2019  . Bilateral hand pain 11/03/2019  . Family history of prostate cancer   . Adnexal cyst 09/26/2019  . Cystitis with hematuria 09/26/2019  . Pain of left calf 03/21/2019  . Use of anastrozole (Arimidex) 03/21/2019  . Elevated transaminase level 01/03/2019  .  Family history of breast cancer   . Stress reaction 09/26/2018  . Malignant neoplasm of upper-inner quadrant of right breast in female, estrogen receptor positive ([place]) 09/20/2018  . B12 deficiency 04/13/2018  . Joint pain 04/11/2018  . Paresthesia 04/11/2018  . Colon cancer screening 09/16/2017  . Prediabetes 07/28/2016  . Allergic rhinitis 11/13/2014  . Screening for lipoid disorders 09/26/2014  . Preventative health care 09/26/2014  . Routine general medical examination at a health care facility 10/28/2012  . Herpes labialis 04/28/2011  . GERD 09/16/2010  . Hypothyroidism 05/07/2008  . Osteopenia 05/07/2008  . Migraine with aura 11/11/2007   Past Medical History:  Diagnosis Date  . Allergic rhinitis   . Anxiety   . Arthritis    hands, knees  . Breast cancer ([place]) 2019   Right Breast Cancer  . Cancer ([place]) 09/2018   Breast CA new DX, right breast  . Cervical dysplasia   . Endometriosis   . Esophageal reflux   . Family history of adverse reaction to anesthesia    younger sister anxiety for a dew days after  . Family history of breast cancer   . Family history of breast cancer   . Family history of prostate cancer   . Hypothyroid   . Migraine with aura, without mention of intractable migraine  without mention of status migrainosus   . Osteopenia 08/2019   T score -2.2 FRAX 11% / 1.7%  . Personal history of radiation therapy 2019   Right Breast Cancer  . Pre-diabetes    Past Surgical History:  Procedure Laterality Date  . BREAST EXCISIONAL BIOPSY Bilateral over 10 years ago   benign x 2   . BREAST LUMPECTOMY Right 10/10/2018  . BREAST LUMPECTOMY WITH RADIOACTIVE SEED AND SENTINEL LYMPH NODE BIOPSY Right 10/10/2018   Procedure: RIGHT BREAST LUMPECTOMY WITH RADIOACTIVE SEED AND RIGHT SENTINEL LYMPH NODE BIOPSY;  Surgeon: Jovita Kussmaul, MD;  Location: [facility];  Service: General;  Laterality: Right;  . BREAST SURGERY     Benign breast lump excised  .  CERVICAL CONE BIOPSY  1985   severe dysplasia  . COLONOSCOPY  2005 and dec 2019   normal  . COLPOSCOPY    . endoscopy  10/2018  . LAPAROSCOPIC ASSISTED VAGINAL HYSTERECTOMY    . LAPAROSCOPIC BILATERAL SALPINGO OOPHERECTOMY Bilateral 10/27/2019   Procedure: LAPAROSCOPIC BILATERAL SALPINGO OOPHORECTOMY WITH PERITONEAL WASHINGS;  Surgeon: Princess Bruins, MD;  Location: [facility];  Service: Gynecology;  Laterality: Bilateral;  request 1:00pm on Friday, Dec. 4th in [place] time held requests one hour OR time  . moles removed from upper back, face lft, 1 between breasts, upper leg left inside  10/18/2019   wearing small round bandaids on for 2 weeks  . ROTATOR CUFF REPAIR Bilateral 08/2008   Social History   Tobacco Use  . Smoking status: Never Smoker  . Smokeless tobacco: Never Used  Vaping Use  . Vaping Use: Never used  Substance Use Topics  . Alcohol use: No    Alcohol/week: 0.0 standard drinks  . Drug use: No   Family History  Problem Relation Age of Onset  . Breast cancer Mother 58  . Diabetes Mother   . Hypertension Father   . Heart disease Father   . Osteoporosis Maternal Grandmother   . Diabetes Maternal Grandmother   . Hypertension Sister   . COPD Paternal Grandmother   . Heart disease Paternal Grandfather   . Breast cancer Cousin 68       pat first cousin  . Breast cancer Cousin        mat second cousin with breast cancer in her 22s  . Prostate cancer Other 51  . Thyroid cancer Cousin 64       pat first cousin   Allergies  Allergen Reactions  . Ciprofloxacin Other (See Comments)    Dizziness   . Codeine Phosphate Other (See Comments)    Dizziness, heart palpitations, nervous  . Decongestant [Pseudoephedrine] Other (See Comments)    dizziness  . Flonase [Fluticasone Propionate] Other (See Comments)    Worsens her migraine   . Hydrocodone Other (See Comments)    Made nervous--10/09 surgery rotator cuff  . Reglan  [Metoclopramide] Other (See Comments)    Dizziness  . Topamax [Topiramate] Other (See Comments)    Dizziness   . Benadryl [Diphenhydramine Hcl] Palpitations  . Sulfa Antibiotics Anxiety   Current Outpatient Medications on File Prior to Visit  Medication Sig Dispense Refill  . ALPRAZolam (XANAX) 0.5 MG tablet TAKE 1/2 TO 1 TABLET BY MOUTH ONCE DAILY AS NEEDED FOR SLEEP/ANXIETY 15 tablet 0  . atenolol (TENORMIN) 25 MG tablet Take 12.5 mg by mouth 3 (three) times daily. For migraine prevention    . Calcium Carbonate-Vitamin D 600-400 MG-UNIT tablet Take 2 tablets  by mouth daily. Takes 2 in am 1 in pm    . cyanocobalamin 2000 MCG tablet Take 2,000 mcg by mouth. 3 x week fri, sat, sunday    . famotidine (PEPCID) 40 MG tablet Take 40 mg by mouth 2 (two) times daily.     Marland Kitchen glucose blood (TRUE METRIX BLOOD GLUCOSE TEST) test strip Use to check blood sugar two times a day. 100 each 6  . hydrOXYzine (ATARAX/VISTARIL) 25 MG tablet Take 1 tablet (25 mg total) by mouth 3 (three) times daily as needed. (Patient taking differently: Take 25 mg by mouth 2 (two) times daily. ) 30 tablet 0  . Lancets (ACCU-CHEK MULTICLIX) lancets Use to check blood sugar two times a day. 100 each 6  . letrozole (FEMARA) 2.5 MG tablet Take 2.5 mg by mouth daily.    Marland Kitchen levocetirizine (XYZAL) 5 MG tablet Take 1 tablet by mouth every evening.  3  . levothyroxine (SYNTHROID) 75 MCG tablet TAKE ONE TABLET BY MOUTH ONE TIME DAILY  before breakfast 30 tablet 5  . Magnesium Oxide 400 (240 Mg) MG TABS Take by mouth.    . Misc Natural Products (GLUCOSAMINE CHOND COMPLEX/MSM) TABS Take 1 tablet by mouth daily.     . Multiple Vitamin (MULTIVITAMIN WITH MINERALS) TABS tablet Take 1 tablet by mouth daily.    . nabumetone (RELAFEN) 500 MG tablet Take 500 mg by mouth daily as needed (migraines).     . nystatin-triamcinolone (MYCOLOG II) cream Apply 1 application topically 2 (two) times daily. (Patient not taking: Reported on 05/28/2020)    .  Omega-3 Fatty Acids (FISH OIL) 1000 MG CAPS Take 1,000 mg by mouth daily.     . OnabotulinumtoxinA (BOTOX IJ) Inject 1 Dose as directed every 6 (six) weeks.     . ondansetron (ZOFRAN ODT) 4 MG disintegrating tablet Take 1 tablet (4 mg total) by mouth every 8 (eight) hours as needed. 15 tablet 0  . Probiotic Product (PROBIOTIC-10 PO) Take 1 capsule by mouth daily.     . promethazine (PHENERGAN) 25 MG tablet Take 12.5 mg by mouth daily as needed for nausea or vomiting. When having a migraine    . rizatriptan (MAXALT) 10 MG tablet Take 1 tablet (10 mg total) by mouth once as needed for up to 10 days for migraine. May repeat in 2 hours if needed 10 tablet 0  . TURMERIC PO Take 1 tablet by mouth daily.      No current facility-administered medications on file prior to visit.     Review of Systems  Constitutional: Negative for activity change, appetite change, fatigue, fever and unexpected weight change.  HENT: Negative for congestion, ear pain, rhinorrhea, sinus pressure and sore throat.   Eyes: Negative for pain, redness and visual disturbance.  Respiratory: Negative for cough, shortness of breath and wheezing.   Cardiovascular: Negative for chest pain and palpitations.  Gastrointestinal: Negative for abdominal distention, abdominal pain, anal bleeding, blood in stool, constipation, diarrhea, nausea, rectal pain and vomiting.       Heartburn Chest discomfort but no acid regurgitation   Endocrine: Negative for polydipsia and polyuria.  Genitourinary: Negative for dysuria, frequency and urgency.  Musculoskeletal: Negative for arthralgias, back pain and myalgias.  Skin: Negative for pallor and rash.  Allergic/Immunologic: Negative for environmental allergies.  Neurological: Negative for dizziness, syncope and headaches.  Hematological: Negative for adenopathy. Does not bruise/bleed easily.  Psychiatric/Behavioral: Negative for decreased concentration and dysphoric mood. The patient is  nervous/anxious.  Stressors are high       Objective:   Physical Exam Constitutional:      General: She is not in acute distress.    Appearance: Normal appearance. She is well-developed and normal weight. She is not ill-appearing or diaphoretic.  HENT:     Head: Normocephalic and atraumatic.     Mouth/Throat:     Pharynx: Oropharynx is clear. No posterior oropharyngeal erythema.  Eyes:     General: No scleral icterus.    Conjunctiva/sclera: Conjunctivae normal.     Pupils: Pupils are equal, round, and reactive to light.  Cardiovascular:     Rate and Rhythm: Normal rate and regular rhythm.     Heart sounds: Normal heart sounds.  Pulmonary:     Effort: Pulmonary effort is normal. No respiratory distress.     Breath sounds: Normal breath sounds. No wheezing or rales.  Chest:     Chest wall: No tenderness.  Abdominal:     General: Bowel sounds are normal. There is no distension.     Palpations: Abdomen is soft. There is no mass.     Tenderness: There is no abdominal tenderness. There is no right CVA tenderness, left CVA tenderness, guarding or rebound.     Hernia: No hernia is present.  Musculoskeletal:     Cervical back: Normal range of motion and neck supple.  Lymphadenopathy:     Cervical: No cervical adenopathy.  Skin:    General: Skin is warm and dry.     Coloration: Skin is not pale.     Findings: No erythema or rash.  Neurological:     Mental Status: She is alert.  Psychiatric:        Mood and Affect: Mood normal.           Assessment & Plan:   Problem List Items Addressed This Visit      Digestive   GERD - Primary    Worse lately with inc in stress  No changes in diet  Takes protonix 40 mg daily (insurance stopped paying for nexium which she preferred) Also taking pepcid bid  Rev last note [facility] / also EGD report -showing mild gerd and gastritis with neg bx  She wishes to change practices Ref made to [facility] for further eval Enc her to  continue elevating head of bed and avoid eating late Handout given Px nexium to see if ins would pay-she will check on that       Relevant Medications   esomeprazole (NEXIUM) 40 MG capsule   Other Relevant Orders   Ambulatory referral to Gastroenterology

## 2020-05-30 ENCOUNTER — Encounter: Payer: Self-pay | Admitting: Physician Assistant

## 2020-06-07 DIAGNOSIS — H6121 Impacted cerumen, right ear: Secondary | ICD-10-CM | POA: Diagnosis not present

## 2020-06-07 DIAGNOSIS — H903 Sensorineural hearing loss, bilateral: Secondary | ICD-10-CM | POA: Diagnosis not present

## 2020-06-07 DIAGNOSIS — H6982 Other specified disorders of Eustachian tube, left ear: Secondary | ICD-10-CM | POA: Diagnosis not present

## 2020-06-07 DIAGNOSIS — H90A22 Sensorineural hearing loss, unilateral, left ear, with restricted hearing on the contralateral side: Secondary | ICD-10-CM | POA: Diagnosis not present

## 2020-06-07 DIAGNOSIS — J342 Deviated nasal septum: Secondary | ICD-10-CM | POA: Diagnosis not present

## 2020-06-07 DIAGNOSIS — J301 Allergic rhinitis due to pollen: Secondary | ICD-10-CM | POA: Diagnosis not present

## 2020-06-10 DIAGNOSIS — G43719 Chronic migraine without aura, intractable, without status migrainosus: Secondary | ICD-10-CM | POA: Diagnosis not present

## 2020-06-13 DIAGNOSIS — G43719 Chronic migraine without aura, intractable, without status migrainosus: Secondary | ICD-10-CM | POA: Diagnosis not present

## 2020-06-13 DIAGNOSIS — G43019 Migraine without aura, intractable, without status migrainosus: Secondary | ICD-10-CM | POA: Diagnosis not present

## 2020-06-13 DIAGNOSIS — M542 Cervicalgia: Secondary | ICD-10-CM | POA: Diagnosis not present

## 2020-06-24 ENCOUNTER — Ambulatory Visit: Payer: BC Managed Care – PPO | Admitting: Physician Assistant

## 2020-06-25 ENCOUNTER — Other Ambulatory Visit: Payer: Self-pay | Admitting: *Deleted

## 2020-06-25 MED ORDER — ALPRAZOLAM 0.5 MG PO TABS: ORAL_TABLET | ORAL | 0 refills | Status: DC

## 2020-06-25 NOTE — Telephone Encounter (Signed)
Name of Medication: Alprazolam 0.5 mg  Name of Pharmacy:[facility] or Written Date and Quantity: # 15 on 04/19/20 Last Office Visit and Type:GERD 05/28/20 (03/01/20 annual) Next Office Visit and Type:none scheduled

## 2020-07-04 ENCOUNTER — Ambulatory Visit: Payer: BC Managed Care – PPO | Admitting: Dermatology

## 2020-07-04 ENCOUNTER — Other Ambulatory Visit: Payer: Self-pay

## 2020-07-04 ENCOUNTER — Encounter: Payer: Self-pay | Admitting: Dermatology

## 2020-07-04 DIAGNOSIS — L282 Other prurigo: Secondary | ICD-10-CM

## 2020-07-04 DIAGNOSIS — L508 Other urticaria: Secondary | ICD-10-CM | POA: Diagnosis not present

## 2020-07-04 MED ORDER — CLOBETASOL PROPIONATE 0.05 % EX OINT: 1.0000 "application " | TOPICAL_OINTMENT | Freq: Two times a day (BID) | CUTANEOUS | 0 refills | Status: DC

## 2020-07-04 MED ORDER — HYDROCORTISONE 2.5 % EX CREA: TOPICAL_CREAM | Freq: Two times a day (BID) | CUTANEOUS | 1 refills | Status: DC | PRN

## 2020-07-04 NOTE — Progress Notes (Signed)
   Follow-Up Visit   Subjective  Jennifer [name] is a 64 y.o. female who presents for the following: Bug Bites (B/L feet).  Patient presents today for what she calls bug bites on b/l feet, they itch when they first come out but do not itch when they are old. They have been present for past week. Patient states that she is having construction in her home right now. Has been using OTC HC to bites prn itch.  The following portions of the chart were reviewed this encounter and updated as appropriate:  Tobacco  Allergies  Meds  Problems  Med Hx  Surg Hx  Fam Hx      Review of Systems:  No other skin or systemic complaints except as noted in HPI or Assessment and Plan.  Objective  Well appearing patient in no apparent distress; mood and affect are within normal limits.  A focused examination was performed including lower extremities, including the legs, feet, toes, and toenails. Relevant physical exam findings are noted in the Assessment and Plan.  Objective  Right Foot - Anterior: Scaly pink papules and plaques raised centrally   Assessment & Plan  Papular urticaria Right Foot - Anterior  Consistent with bug bite reaction  Start Clobetasol ointment twice daily as needed up to two weeks. Avoid applying to face, groin, and axilla. Use as directed. Risk of skin atrophy with long-term use reviewed.   Start HC 2.5% cream twice daily prn for face.  clobetasol ointment (TEMOVATE) 0.05 % - Right Foot - Anterior  hydrocortisone 2.5 % cream - Right Foot - Anterior  Return if symptoms worsen or fail to improve.   IDonzetta Kohut, CMA, am acting as scribe for Forest Gleason, MD .  Documentation: I have reviewed the above documentation for accuracy and completeness, and I agree with the above.  Forest Gleason, MD

## 2020-07-04 NOTE — Patient Instructions (Addendum)
Recommend daily broad spectrum sunscreen SPF 30+ to sun-exposed areas, reapply every 2 hours as needed. Call for new or changing lesions.  Topical steroids (such as triamcinolone, fluocinolone, fluocinonide, mometasone, clobetasol, halobetasol, betamethasone, hydrocortisone) can cause thinning and lightening of the skin if they are used for too long in the same area. Your physician has selected the right strength medicine for your problem and area affected on the body. Please use your medication only as directed by your physician to prevent side effects.   Avoid applying to face, groin, and axilla. Use as directed. Risk of skin atrophy with long-term use reviewed.   Recommend Gold Bond Rapid Relief Anti-Itch cream up to 3 times per day to areas that are itchy.

## 2020-07-08 ENCOUNTER — Telehealth: Payer: Self-pay

## 2020-07-08 NOTE — Telephone Encounter (Signed)
Unable to reach pt by phone and I see that pt already has 30' appt to see D Tower on 07/10/2020 at 1:30 PM. Morey Hummingbird at front desk said that pt already has 30' appt with Dr Glori Bickers, will send FYI to Dr Glori Bickers.

## 2020-07-08 NOTE — Telephone Encounter (Signed)
[name] - Client TELEPHONE ADVICE RECORD [facility] Patient Name: Jennifer [name] Gender: Female DOB: [birth year] Age: 64 Y 33 M 7 D Return Phone Number: [phone] (Primary) Address: City/State/Zip: [name] Client [name] - Client Client Site [place] MD Contact Type Call Who Is Calling Patient / Member / Family / Caregiver Call Type Triage / Clinical Relationship To Patient Self Return Phone Number [phone] (Primary) Chief Complaint Health information question (non symptomatic) Reason for Call Symptomatic / Request for [place] states she was experiencing dizziness and nausea yesterday. Caller states she was also having problems moving her foot. Caller is feeling fine, but would like to further discuss her concerns. Translation No Nurse Assessment Nurse: Windle Guard, RN, Olin Hauser Date/Time (Eastern Time): 07/08/2020 11:31:46 AM Confirm and document reason for call. If symptomatic, describe symptoms. ---Caller states she had 2 episodes yesterday, once she became extremely dizzy and the second time she had nausea and difficulty moving her foot, which lasted 2-3 seconds. States no symptoms of either today. Has the patient had close contact with a person known or suspected to have the novel coronavirus illness OR traveled / lives in area with major community spread (including international travel) in the last 14 days from the onset of symptoms? * If Asymptomatic, screen for exposure and travel within the last 14 days. ---No Does the patient have any new or worsening symptoms? ---Yes Will a triage be completed? ---Yes Related visit to physician within the last 2 weeks? ---No Does the PT have any chronic conditions? (i.e. diabetes, asthma, this includes High risk factors for pregnancy, etc.) ---Yes List chronic  conditions. ---Pre Diabetes, Hypothyroidism, GERD, H/O Breast Cancer, Migraines Is this a behavioral health or substance abuse call? ---No Guidelines Guideline Title Affirmed Question Affirmed Notes Nurse Date/Time (Eastern Time) Dizziness - Lightheadedness [1] MILD dizziness (e.g., walking normally) AND [2] has NOT been Actor, RN, Olin Hauser 07/08/2020 11:36:33 AM PLEASE NOTE: All timestamps contained within this report are represented as [place] Standard Time. CONFIDENTIALTY NOTICE: This fax transmission is intended only for the addressee. It contains information that is legally privileged, confidential or otherwise protected from use or disclosure. If you are not the intended recipient, you are strictly prohibited from reviewing, disclosing, copying using or disseminating any of this information or taking any action in reliance on or regarding this information. If you have received this fax in error, please notify us immediately by telephone so that we can arrange for its return to [place]. Phone: [phone], Toll-Free: [phone], Fax: [phone] Page: 2 of 2 Call Id: [id number] Guidelines Guideline Title Affirmed Question Affirmed Notes Nurse Date/Time Eilene Ghazi Time) evaluated by physician for this (Exception: dizziness caused by heat exposure, sudden standing, or poor fluid intake) Disp. Time Eilene Ghazi Time) Disposition Final User 07/08/2020 11:42:31 AM SEE PCP WITHIN 3 DAYS Yes Conner, RN, Otho Najjar Disagree/Comply Comply Caller Understands Yes PreDisposition Call Doctor Care Advice Given Per Guideline SEE PCP WITHIN 3 DAYS: * You need to be seen within 2 or 3 days. DRINK FLUIDS: * Drink several glasses of fruit juice, other clear fluids or water. * This will improve hydration and blood glucose. CALL BACK IF: * You become worse. CARE ADVICE given per Dizziness (Adult) guideline. Referrals REFERRED TO PCP OFFICE

## 2020-07-08 NOTE — Telephone Encounter (Signed)
Pt has appt 07-10-20

## 2020-07-08 NOTE — Telephone Encounter (Signed)
Unable to reach pt by phone and I left v/m requesting pt call [facility].

## 2020-07-10 ENCOUNTER — Ambulatory Visit (INDEPENDENT_AMBULATORY_CARE_PROVIDER_SITE_OTHER): Payer: BC Managed Care – PPO | Admitting: Family Medicine

## 2020-07-10 ENCOUNTER — Encounter: Payer: Self-pay | Admitting: Family Medicine

## 2020-07-10 ENCOUNTER — Other Ambulatory Visit: Payer: Self-pay

## 2020-07-10 VITALS — BP 120/74 | HR 76 | Temp 97.0°F | Wt 132.0 lb

## 2020-07-10 DIAGNOSIS — R42 Dizziness and giddiness: Secondary | ICD-10-CM | POA: Diagnosis not present

## 2020-07-10 DIAGNOSIS — R29898 Other symptoms and signs involving the musculoskeletal system: Secondary | ICD-10-CM

## 2020-07-10 DIAGNOSIS — G43109 Migraine with aura, not intractable, without status migrainosus: Secondary | ICD-10-CM

## 2020-07-10 DIAGNOSIS — G4459 Other complicated headache syndrome: Secondary | ICD-10-CM

## 2020-07-10 LAB — BASIC METABOLIC PANEL
BUN: 17 mg/dL (ref 6–23)
CO2: 30 mEq/L (ref 19–32)
Calcium: 9.6 mg/dL (ref 8.4–10.5)
Chloride: 98 mEq/L (ref 96–112)
Creatinine, Ser: 0.72 mg/dL (ref 0.40–1.20)
GFR: 81.61 mL/min (ref >60.00)
Glucose, Bld: 92 mg/dL (ref 70–99)
Potassium: 4.1 mEq/L (ref 3.5–5.1)
Sodium: 135 mEq/L (ref 135–145)

## 2020-07-10 NOTE — Assessment & Plan Note (Signed)
Unsure if recent dizziness and R foot weakness (fleeting) could be migraine related Pt continues f/u with Dr Domingo Cocking and will d/w him soon at next visit

## 2020-07-10 NOTE — Assessment & Plan Note (Signed)
2 fleeting episodes of R foot weakness (heavy feeling) assoc with dizziness in pt with h/o migraines Nl exam today and not orthostatic  She takes letrozole for breast cancer and atenolol for headache prevention  Disc poss of migraine (? Brief hemiplegia) vs other etiology  Given short length of symptoms -low suspicion for cva or MS   bmet today and then will ref for MRI of head

## 2020-07-10 NOTE — Assessment & Plan Note (Signed)
Generally with change in position to standing  Pt does take a beta blocker and sometimes feels dizzy upon standing  Has had 2 episodes corresp to a weak R foot  ? If possibly migraine variant  Will order MRI of brain

## 2020-07-10 NOTE — Patient Instructions (Addendum)
Let's get labs (bmp) today  I am interested in getting brain MRI   We will be calling you to get this going   If symptoms suddenly worsen or persist- go to the ER and let us know    Take care of yourself   Do discuss your symptoms with Dr Domingo Cocking

## 2020-07-10 NOTE — Progress Notes (Signed)
Subjective:    Patient ID: Jennifer [name], female    DOB: 1955/12/29, 64 y.o.   MRN: [id number]  This visit occurred during the SARS-CoV-2 public health emergency.  Safety protocols were in place, including screening questions prior to the visit, additional usage of staff PPE, and extensive cleaning of exam room while observing appropriate contact time as indicated for disinfecting solutions.    HPI Pt presents with c/o dizziness and "heavy" feeling R foot   Wt Readings from Last 3 Encounters:  07/10/20 132 lb (59.9 kg)  05/28/20 130 lb 4.8 oz (59.1 kg)  05/02/20 130 lb (59 kg)   24.94 kg/m  She has had 2 episodes   A month ago- she walked across room/ got light headed  Then her R foot felt heavy and hard to move quick/fleeting)  Sunday- had some nausea (prodrome to migraine) Had another episode of foot heaviness (fleeting as well)   Had one episode of numbness (very brief) in R arm - ? Pinched nerve   Migraines were doing better for a while Then started to come back again   Had nl head CT in 2019   [facility]  Has trigger shots next week  Also gets botox   Feels fine today     Takes beta blocker for migraine prevention   BP Readings from Last 3 Encounters:  07/10/20 120/74  05/28/20 120/70  05/02/20 135/75   Pulse Readings from Last 3 Encounters:  07/10/20 76  05/28/20 (!) 113  05/02/20 63    Patient Active Problem List   Diagnosis Date Noted  . Weakness of right foot 07/10/2020  . Dizziness 07/10/2020  . History of breast cancer 03/01/2020  . Genetic testing 11/14/2019  . Bilateral hand pain 11/03/2019  . Family history of prostate cancer   . Adnexal cyst 09/26/2019  . Cystitis with hematuria 09/26/2019  . Pain of left calf 03/21/2019  . Use of anastrozole (Arimidex) 03/21/2019  . Elevated transaminase level 01/03/2019  . Family history of breast cancer   . Stress reaction 09/26/2018  . Malignant neoplasm of upper-inner  quadrant of right breast in female, estrogen receptor positive ([place]) 09/20/2018  . B12 deficiency 04/13/2018  . Joint pain 04/11/2018  . Paresthesia 04/11/2018  . Colon cancer screening 09/16/2017  . Prediabetes 07/28/2016  . Allergic rhinitis 11/13/2014  . Screening for lipoid disorders 09/26/2014  . Preventative health care 09/26/2014  . Routine general medical examination at a health care facility 10/28/2012  . Herpes labialis 04/28/2011  . GERD 09/16/2010  . Hypothyroidism 05/07/2008  . Osteopenia 05/07/2008  . Migraine with aura 11/11/2007   Past Medical History:  Diagnosis Date  . Allergic rhinitis   . Anxiety   . Arthritis    hands, knees  . Breast cancer ([place]) 2019   Right Breast Cancer  . Cancer ([place]) 09/2018   Breast CA new DX, right breast  . Cervical dysplasia   . Endometriosis   . Esophageal reflux   . Family history of adverse reaction to anesthesia    younger sister anxiety for a dew days after  . Family history of breast cancer   . Family history of breast cancer   . Family history of prostate cancer   . Hypothyroid   . Migraine with aura, without mention of intractable migraine without mention of status migrainosus   . Osteopenia 08/2019   T score -2.2 FRAX 11% / 1.7%  . Personal history of radiation therapy  2019   Right Breast Cancer  . Pre-diabetes    Past Surgical History:  Procedure Laterality Date  . BREAST EXCISIONAL BIOPSY Bilateral over 10 years ago   benign x 2   . BREAST LUMPECTOMY Right 10/10/2018  . BREAST LUMPECTOMY WITH RADIOACTIVE SEED AND SENTINEL LYMPH NODE BIOPSY Right 10/10/2018   Procedure: RIGHT BREAST LUMPECTOMY WITH RADIOACTIVE SEED AND RIGHT SENTINEL LYMPH NODE BIOPSY;  Surgeon: Jovita Kussmaul, MD;  Location: [facility];  Service: General;  Laterality: Right;  . BREAST SURGERY     Benign breast lump excised  . CERVICAL CONE BIOPSY  1985   severe dysplasia  . COLONOSCOPY  2005 and dec 2019   normal  .  COLPOSCOPY    . endoscopy  10/2018  . LAPAROSCOPIC ASSISTED VAGINAL HYSTERECTOMY    . LAPAROSCOPIC BILATERAL SALPINGO OOPHERECTOMY Bilateral 10/27/2019   Procedure: LAPAROSCOPIC BILATERAL SALPINGO OOPHORECTOMY WITH PERITONEAL WASHINGS;  Surgeon: Princess Bruins, MD;  Location: [facility];  Service: Gynecology;  Laterality: Bilateral;  request 1:00pm on Friday, Dec. 4th in [place] time held requests one hour OR time  . moles removed from upper back, face lft, 1 between breasts, upper leg left inside  10/18/2019   wearing small round bandaids on for 2 weeks  . ROTATOR CUFF REPAIR Bilateral 08/2008   Social History   Tobacco Use  . Smoking status: Never Smoker  . Smokeless tobacco: Never Used  Vaping Use  . Vaping Use: Never used  Substance Use Topics  . Alcohol use: No    Alcohol/week: 0.0 standard drinks  . Drug use: No   Family History  Problem Relation Age of Onset  . Breast cancer Mother 34  . Diabetes Mother   . Hypertension Father   . Heart disease Father   . Osteoporosis Maternal Grandmother   . Diabetes Maternal Grandmother   . Hypertension Sister   . COPD Paternal Grandmother   . Heart disease Paternal Grandfather   . Breast cancer Cousin 23       pat first cousin  . Breast cancer Cousin        mat second cousin with breast cancer in her 13s  . Prostate cancer Other 76  . Thyroid cancer Cousin 80       pat first cousin   Allergies  Allergen Reactions  . Ciprofloxacin Other (See Comments)    Dizziness   . Codeine Phosphate Other (See Comments)    Dizziness, heart palpitations, nervous  . Decongestant [Pseudoephedrine] Other (See Comments)    dizziness  . Flonase [Fluticasone Propionate] Other (See Comments)    Worsens her migraine   . Hydrocodone Other (See Comments)    Made nervous--10/09 surgery rotator cuff  . Reglan [Metoclopramide] Other (See Comments)    Dizziness  . Topamax [Topiramate] Other (See Comments)     Dizziness   . Benadryl [Diphenhydramine Hcl] Palpitations  . Sulfa Antibiotics Anxiety   Current Outpatient Medications on File Prior to Visit  Medication Sig Dispense Refill  . ALPRAZolam (XANAX) 0.5 MG tablet TAKE 1/2 TO 1 TABLET BY MOUTH ONCE DAILY AS NEEDED FOR SLEEP/ANXIETY 15 tablet 0  . atenolol (TENORMIN) 25 MG tablet Take 12.5 mg by mouth 3 (three) times daily. For migraine prevention    . Calcium Carbonate-Vitamin D 600-400 MG-UNIT tablet Take 2 tablets by mouth daily. Takes 2 in am 1 in pm    . clobetasol ointment (TEMOVATE) 6.33 % Apply 1 application topically 2 (two) times  daily. Avoid applying to face, groin, and axilla. Use as directed. 30 g 0  . cyanocobalamin 2000 MCG tablet Take 2,000 mcg by mouth. 3 x week fri, sat, sunday    . famotidine (PEPCID) 40 MG tablet Take 40 mg by mouth 2 (two) times daily.     Marland Kitchen glucose blood (TRUE METRIX BLOOD GLUCOSE TEST) test strip Use to check blood sugar two times a day. 100 each 6  . hydrocortisone 2.5 % cream Apply topically 2 (two) times daily as needed (Rash). 30 g 1  . hydrOXYzine (ATARAX/VISTARIL) 25 MG tablet Take 1 tablet (25 mg total) by mouth 3 (three) times daily as needed. (Patient taking differently: Take 25 mg by mouth 2 (two) times daily. ) 30 tablet 0  . Lancets (ACCU-CHEK MULTICLIX) lancets Use to check blood sugar two times a day. 100 each 6  . letrozole (FEMARA) 2.5 MG tablet Take 2.5 mg by mouth daily.    Marland Kitchen levocetirizine (XYZAL) 5 MG tablet Take 1 tablet by mouth every evening.  3  . levothyroxine (SYNTHROID) 75 MCG tablet TAKE ONE TABLET BY MOUTH ONE TIME DAILY  before breakfast 30 tablet 5  . Magnesium Oxide 400 (240 Mg) MG TABS Take by mouth.    . Misc Natural Products (GLUCOSAMINE CHOND COMPLEX/MSM) TABS Take 1 tablet by mouth daily.     . Multiple Vitamin (MULTIVITAMIN WITH MINERALS) TABS tablet Take 1 tablet by mouth daily.    . nabumetone (RELAFEN) 500 MG tablet Take 500 mg by mouth daily as needed (migraines).      . Omega-3 Fatty Acids (FISH OIL) 1000 MG CAPS Take 1,000 mg by mouth daily.     . OnabotulinumtoxinA (BOTOX IJ) Inject 1 Dose as directed every 6 (six) weeks.     . ondansetron (ZOFRAN ODT) 4 MG disintegrating tablet Take 1 tablet (4 mg total) by mouth every 8 (eight) hours as needed. 15 tablet 0  . pantoprazole (PROTONIX) 40 MG tablet TAKE ONE TABLET BY MOUTH ONE TIME DAILY 90 tablet 0  . Probiotic Product (PROBIOTIC-10 PO) Take 1 capsule by mouth daily.     . promethazine (PHENERGAN) 25 MG tablet Take 12.5 mg by mouth daily as needed for nausea or vomiting. When having a migraine    . TURMERIC PO Take 1 tablet by mouth daily.     . rizatriptan (MAXALT) 10 MG tablet Take 1 tablet (10 mg total) by mouth once as needed for up to 10 days for migraine. May repeat in 2 hours if needed 10 tablet 0   No current facility-administered medications on file prior to visit.     Review of Systems  Constitutional: Negative for activity change, appetite change, fatigue, fever and unexpected weight change.  HENT: Negative for congestion, ear pain, rhinorrhea, sinus pressure and sore throat.   Eyes: Negative for pain, redness and visual disturbance.  Respiratory: Negative for cough, shortness of breath and wheezing.   Cardiovascular: Negative for chest pain and palpitations.  Gastrointestinal: Negative for abdominal pain, blood in stool, constipation and diarrhea.  Endocrine: Negative for polydipsia and polyuria.  Genitourinary: Negative for dysuria, frequency and urgency.  Musculoskeletal: Negative for arthralgias, back pain and myalgias.  Skin: Negative for pallor and rash.  Allergic/Immunologic: Negative for environmental allergies.  Neurological: Positive for dizziness, weakness and headaches. Negative for syncope, facial asymmetry and numbness.  Hematological: Negative for adenopathy. Does not bruise/bleed easily.  Psychiatric/Behavioral: Negative for decreased concentration and dysphoric mood. The  patient is not nervous/anxious.  Objective:   Physical Exam Constitutional:      General: She is not in acute distress.    Appearance: Normal appearance. She is well-developed and normal weight. She is not ill-appearing or diaphoretic.  HENT:     Head: Normocephalic and atraumatic.     Right Ear: External ear normal.     Left Ear: External ear normal.     Nose: Nose normal.     Mouth/Throat:     Pharynx: No oropharyngeal exudate.  Eyes:     General: No scleral icterus.       Right eye: No discharge.        Left eye: No discharge.     Conjunctiva/sclera: Conjunctivae normal.     Pupils: Pupils are equal, round, and reactive to light.     Comments: No nystagmus  Neck:     Thyroid: No thyromegaly.     Vascular: No carotid bruit or JVD.     Trachea: No tracheal deviation.  Cardiovascular:     Rate and Rhythm: Normal rate and regular rhythm.     Pulses: Normal pulses.     Heart sounds: Normal heart sounds. No murmur heard.   Pulmonary:     Effort: Pulmonary effort is normal. No respiratory distress.     Breath sounds: Normal breath sounds. No wheezing or rales.  Abdominal:     General: Bowel sounds are normal. There is no distension.     Palpations: Abdomen is soft. There is no mass.     Tenderness: There is no abdominal tenderness.  Musculoskeletal:        General: No tenderness.     Cervical back: Full passive range of motion without pain, normal range of motion and neck supple. No rigidity or tenderness.     Right lower leg: No edema.     Left lower leg: No edema.  Lymphadenopathy:     Cervical: No cervical adenopathy.  Skin:    General: Skin is warm and dry.     Coloration: Skin is not pale.     Findings: No erythema or rash.  Neurological:     Mental Status: She is alert and oriented to person, place, and time.     Cranial Nerves: Cranial nerves are intact. No cranial nerve deficit.     Sensory: Sensation is intact. No sensory deficit.     Motor: Motor  function is intact. No weakness, tremor, atrophy or abnormal muscle tone.     Coordination: Coordination is intact. Romberg sign negative. Coordination normal. Finger-Nose-Finger Test normal.     Gait: Gait is intact. Gait and tandem walk normal.     Deep Tendon Reflexes: Reflexes are normal and symmetric. Reflexes normal.     Comments: No focal cerebellar signs   Psychiatric:        Behavior: Behavior normal.        Thought Content: Thought content normal.           Assessment & Plan:   Problem List Items Addressed This Visit      Cardiovascular and Mediastinum   Migraine with aura    Unsure if recent dizziness and R foot weakness (fleeting) could be migraine related Pt continues f/u with Dr Domingo Cocking and will d/w him soon at next visit         Other   Weakness of right foot - Primary    2 fleeting episodes of R foot weakness (heavy feeling) assoc with dizziness in pt with h/o migraines Nl  exam today and not orthostatic  She takes letrozole for breast cancer and atenolol for headache prevention  Disc poss of migraine (? Brief hemiplegia) vs other etiology  Given short length of symptoms -low suspicion for cva or MS   bmet today and then will ref for MRI of head         Relevant Orders   Basic metabolic panel   Dizziness    Generally with change in position to standing  Pt does take a beta blocker and sometimes feels dizzy upon standing  Has had 2 episodes corresp to a weak R foot  ? If possibly migraine variant  Will order MRI of brain        Relevant Orders   Basic metabolic panel

## 2020-07-12 NOTE — Progress Notes (Signed)
Pt was approved and scheduled for 07/30/20 at 8:00 at [facility] -- outpatient clinics are not doing MRIs at this time so she had to have it done at the hospital.

## 2020-07-14 ENCOUNTER — Encounter: Payer: Self-pay | Admitting: Dermatology

## 2020-07-19 DIAGNOSIS — G43019 Migraine without aura, intractable, without status migrainosus: Secondary | ICD-10-CM | POA: Diagnosis not present

## 2020-07-19 DIAGNOSIS — G43719 Chronic migraine without aura, intractable, without status migrainosus: Secondary | ICD-10-CM | POA: Diagnosis not present

## 2020-07-19 DIAGNOSIS — M542 Cervicalgia: Secondary | ICD-10-CM | POA: Diagnosis not present

## 2020-07-22 DIAGNOSIS — Z20822 Contact with and (suspected) exposure to covid-19: Secondary | ICD-10-CM | POA: Diagnosis not present

## 2020-07-22 DIAGNOSIS — Z03818 Encounter for observation for suspected exposure to other biological agents ruled out: Secondary | ICD-10-CM | POA: Diagnosis not present

## 2020-07-23 ENCOUNTER — Encounter: Payer: Self-pay | Admitting: Physician Assistant

## 2020-07-23 ENCOUNTER — Ambulatory Visit (INDEPENDENT_AMBULATORY_CARE_PROVIDER_SITE_OTHER): Payer: BC Managed Care – PPO | Admitting: Physician Assistant

## 2020-07-23 VITALS — BP 120/78 | HR 65 | Ht 60.0 in | Wt 133.4 lb

## 2020-07-23 DIAGNOSIS — K219 Gastro-esophageal reflux disease without esophagitis: Secondary | ICD-10-CM

## 2020-07-23 DIAGNOSIS — R194 Change in bowel habit: Secondary | ICD-10-CM

## 2020-07-23 NOTE — Addendum Note (Signed)
Addended by: Alfonso Patten on: 07/23/2020 03:11 AM   Modules accepted: Level of Service

## 2020-07-23 NOTE — Progress Notes (Signed)
Chief Complaint: GERD  HPI:    Jennifer [name] is a 64 year old female with a past medical history as listed below, who was referred to me by Glori Bickers, [name], MD for a complaint of GERD.      12/09/2018 patient seen by the [facility] clinic for reflux.  At that time she had had a recent EGD 11/09/2018 with mild reflux, gastritis and fundic gland polyps.  No H. pylori Barrett's dysplasia or malignancy.  Colonoscopy completed the same day with diverticulosis and hemorrhoids.  Random biopsies were negative.  At that visit she discussed that she had a new diagnosis of right breast cancer.  She was taking pantoprazole 40 mg p.o. daily and Pepcid 20 mg twice daily.  It was deemed that her esophagitis/reflux was stable.  She is continued on current therapy.  Repeat colonoscopy was recommended in 5 years.    Today, the patient presents to clinic and tells me that really she does not have any big complaints but wanted to establish care in our clinic with Dr. Tarri Glenn.  Tells me her sister sees her and just "raves about her".  Explains that she has known reflux and has been on Pantoprazole 40 mg once daily for years.  She is also on Famotidine 40 mg twice daily for some urticaria which she was started on over a year ago.  Tells me she has tried to stop Pantoprazole in the past because she does have osteopenia and is worried about malabsorption but just had terrible breakthrough reflux symptoms and was not able to do this.    Patient also describes that after her last colonoscopy in 2018 and she had trouble with a change in bowel habits towards constipation afterwards.  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  She has continued with her regular diet which includes a lot of [place] yogurt etc. and feels like she is slowly returning to normal over the past year.    Denies fever,  chills, weight loss, blood in her stool or abdominal pain.  Past Medical History:  Diagnosis Date  . Allergic rhinitis   . Anxiety   . Arthritis    hands, knees  . Breast cancer ([place]) 2019   Right Breast Cancer  . Cancer ([place]) 09/2018   Breast CA new DX, right breast  . Cervical dysplasia   . Endometriosis   . Esophageal reflux   . Family history of adverse reaction to anesthesia    younger sister anxiety for a dew days after  . Family history of breast cancer   . Family history of breast cancer   . Family history of prostate cancer   . Hypothyroid   . Migraine with aura, without mention of intractable migraine without mention of status migrainosus   . Osteopenia 08/2019   T score -2.2 FRAX 11% / 1.7%  . Personal history of radiation therapy 2019   Right Breast Cancer  . Pre-diabetes     Past Surgical History:  Procedure Laterality Date  . BREAST EXCISIONAL BIOPSY Bilateral over 10 years ago   benign x 2   . BREAST LUMPECTOMY Right 10/10/2018  . BREAST LUMPECTOMY WITH RADIOACTIVE SEED AND SENTINEL LYMPH NODE BIOPSY Right 10/10/2018   Procedure: RIGHT BREAST LUMPECTOMY WITH RADIOACTIVE SEED AND RIGHT SENTINEL LYMPH NODE BIOPSY;  Surgeon: Jovita Kussmaul, MD;  Location:  [facility];  Service: General;  Laterality: Right;  . BREAST SURGERY     Benign breast lump excised  . CERVICAL CONE BIOPSY  1985   severe dysplasia  . COLONOSCOPY  2005 and dec 2019   normal  . COLPOSCOPY    . endoscopy  10/2018  . LAPAROSCOPIC ASSISTED VAGINAL HYSTERECTOMY    . LAPAROSCOPIC BILATERAL SALPINGO OOPHERECTOMY Bilateral 10/27/2019   Procedure: LAPAROSCOPIC BILATERAL SALPINGO OOPHORECTOMY WITH PERITONEAL WASHINGS;  Surgeon: Princess Bruins, MD;  Location: [facility];  Service: Gynecology;  Laterality: Bilateral;  request 1:00pm on Friday, Dec. 4th in [place] time held requests one hour OR time  . moles removed from upper back, face lft, 1 between  breasts, upper leg left inside  10/18/2019   wearing small round bandaids on for 2 weeks  . ROTATOR CUFF REPAIR Bilateral 08/2008    Current Outpatient Medications  Medication Sig Dispense Refill  . ALPRAZolam (XANAX) 0.5 MG tablet TAKE 1/2 TO 1 TABLET BY MOUTH ONCE DAILY AS NEEDED FOR SLEEP/ANXIETY 15 tablet 0  . atenolol (TENORMIN) 25 MG tablet Take 12.5 mg by mouth 3 (three) times daily. For migraine prevention    . Calcium Carbonate-Vitamin D 600-400 MG-UNIT tablet Take 2 tablets by mouth daily. Takes 2 in am 1 in pm    . clobetasol ointment (TEMOVATE) 2.99 % Apply 1 application topically 2 (two) times daily. Avoid applying to face, groin, and axilla. Use as directed. 30 g 0  . cyanocobalamin 2000 MCG tablet Take 2,000 mcg by mouth. 3 x week fri, sat, sunday    . famotidine (PEPCID) 40 MG tablet Take 40 mg by mouth 2 (two) times daily.     Marland Kitchen glucose blood (TRUE METRIX BLOOD GLUCOSE TEST) test strip Use to check blood sugar two times a day. 100 each 6  . hydrocortisone 2.5 % cream Apply topically 2 (two) times daily as needed (Rash). 30 g 1  . hydrOXYzine (ATARAX/VISTARIL) 25 MG tablet Take 1 tablet (25 mg total) by mouth 3 (three) times daily as needed. (Patient taking differently: Take 25 mg by mouth 2 (two) times daily. ) 30 tablet 0  . Lancets (ACCU-CHEK MULTICLIX) lancets Use to check blood sugar two times a day. 100 each 6  . letrozole (FEMARA) 2.5 MG tablet Take 2.5 mg by mouth daily.    Marland Kitchen levocetirizine (XYZAL) 5 MG tablet Take 1 tablet by mouth every evening.  3  . levothyroxine (SYNTHROID) 75 MCG tablet TAKE ONE TABLET BY MOUTH ONE TIME DAILY  before breakfast 30 tablet 5  . Magnesium Oxide 400 (240 Mg) MG TABS Take by mouth.    . Misc Natural Products (GLUCOSAMINE CHOND COMPLEX/MSM) TABS Take 1 tablet by mouth daily.     . Multiple Vitamin (MULTIVITAMIN WITH MINERALS) TABS tablet Take 1 tablet by mouth daily.    . nabumetone (RELAFEN) 500 MG tablet Take 500 mg by mouth daily as  needed (migraines).     . Omega-3 Fatty Acids (FISH OIL) 1000 MG CAPS Take 1,000 mg by mouth daily.     . OnabotulinumtoxinA (BOTOX IJ) Inject 1 Dose as directed every 6 (six) weeks.     . ondansetron (ZOFRAN ODT) 4 MG disintegrating tablet Take 1 tablet (4 mg total) by mouth every 8 (eight) hours as needed. 15 tablet 0  . pantoprazole (PROTONIX) 40 MG tablet TAKE ONE TABLET BY MOUTH ONE TIME DAILY 90 tablet 0  . Probiotic Product (PROBIOTIC-10 PO) Take 1  capsule by mouth daily.     . promethazine (PHENERGAN) 25 MG tablet Take 12.5 mg by mouth daily as needed for nausea or vomiting. When having a migraine    . rizatriptan (MAXALT) 10 MG tablet Take 1 tablet (10 mg total) by mouth once as needed for up to 10 days for migraine. May repeat in 2 hours if needed 10 tablet 0  . TURMERIC PO Take 1 tablet by mouth daily.      No current facility-administered medications for this visit.    Allergies as of 07/23/2020 - Review Complete 07/14/2020  Allergen Reaction Noted  . Ciprofloxacin Other (See Comments) 09/21/2018  . Codeine phosphate Other (See Comments) 02/07/2007  . Decongestant [pseudoephedrine] Other (See Comments) 09/21/2018  . Flonase [fluticasone propionate] Other (See Comments) 11/13/2014  . Hydrocodone Other (See Comments) 11/27/2008  . Reglan [metoclopramide] Other (See Comments) 09/23/2018  . Topamax [topiramate] Other (See Comments) 09/21/2018  . Benadryl [diphenhydramine hcl] Palpitations 10/18/2017  . Sulfa antibiotics Anxiety 09/05/2014    Family History  Problem Relation Age of Onset  . Breast cancer Mother 60  . Diabetes Mother   . Hypertension Father   . Heart disease Father   . Osteoporosis Maternal Grandmother   . Diabetes Maternal Grandmother   . Hypertension Sister   . COPD Paternal Grandmother   . Heart disease Paternal Grandfather   . Breast cancer Cousin 51       pat first cousin  . Breast cancer Cousin        mat second cousin with breast cancer in her 19s   . Prostate cancer Other 21  . Thyroid cancer Cousin 60       pat first cousin    Social History   Socioeconomic History  . Marital status: Widowed    Spouse name: Not on file  . Number of children: Not on file  . Years of education: Not on file  . Highest education level: Not on file  Occupational History  . 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 Smoking status: Never Smoker  . Smokeless tobacco: Never Used  Vaping Use  . Vaping Use: Never used  Substance and Sexual Activity  . Alcohol use: No    Alcohol/week: 0.0 standard drinks  . Drug use: No  . Sexual activity: Not Currently    Birth control/protection: Post-menopausal    Comment: 1st intercourse 58 yo-5 partners  Other Topics Concern  . 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   Social Determinants of Health   Financial Resource Strain:   . Difficulty of Paying Living Expenses: Not on file  Food Insecurity:   . Worried About Charity fundraiser in the Last Year: Not on file  . Ran Out of Food in the Last Year: Not on file  Transportation Needs:   . Lack of Transportation (Medical): Not on file  . Lack of Transportation (Non-Medical): Not on file  Physical Activity:   . Days of Exercise per Week: Not on file  . Minutes of Exercise per Session: Not on file  Stress:   . Feeling of Stress : Not on file  Social Connections:   . Frequency of Communication with Friends and Family: Not on file  . Frequency of Social Gatherings with Friends and Family: Not on file  . Attends Religious Services: Not on file  . Active Member of Clubs or Organizations:  Not on file  . Attends Archivist Meetings: Not on file  . Marital Status: Not on file  Intimate Partner Violence:   . Fear of Current or Ex-Partner: Not on file  . Emotionally Abused: Not on file  . Physically Abused: Not on file  . Sexually Abused: Not on file     Review of Systems:    Constitutional: No weight loss, fever or chills Skin: No rash  Cardiovascular: No chest pain  Respiratory: No SOB Gastrointestinal: See HPI and otherwise negative Genitourinary: No dysuria  Neurological: No headache, dizziness or syncope Musculoskeletal: No new muscle or joint pain Hematologic: No bleeding  Psychiatric: No history of depression or anxiety   Physical Exam:  Vital signs: BP 120/78   Pulse 65   Ht 5' (1.524 m)   Wt 133 lb 6 oz (60.5 kg)   BMI 26.05 kg/m   Constitutional:   Pleasant Caucasian female appears to be in NAD, Well developed, Well nourished, alert and cooperative Head:  Normocephalic and atraumatic. Eyes:   PEERL, EOMI. No icterus. Conjunctiva pink. Ears:  Normal auditory acuity. Neck:  Supple Throat: Oral cavity and pharynx without inflammation, swelling or lesion.  Respiratory: Respirations even and unlabored. Lungs clear to auscultation bilaterally.   No wheezes, crackles, or rhonchi.  Cardiovascular: Normal S1, S2. No MRG. Regular rate and rhythm. No peripheral edema, cyanosis or pallor.  Gastrointestinal:  Soft, nondistended, nontender. No rebound or guarding. Normal bowel sounds. No appreciable masses or hepatomegaly. Rectal:  Not performed.  Msk:  Symmetrical without gross deformities. Without edema, no deformity or joint abnormality.  Neurologic:  Alert and  oriented x4;  grossly normal neurologically.  Skin:   Dry and intact without significant lesions or rashes. Psychiatric:  Demonstrates good judgement and reason without abnormal affect or behaviors.  RELEVANT LABS AND IMAGING: CBC    Component Value Date/Time   WBC 5.5 02/26/2020 1357   RBC 4.43 02/26/2020 1357   HGB 11.9 (L) 02/26/2020 1357   HGB 12.2 09/21/2018 1234   HGB 14.1 03/17/2015 0608   HCT 36.0 02/26/2020 1357   HCT 40.7 03/17/2015 0608   PLT 314.0 02/26/2020 1357   PLT 300 09/21/2018 1234   PLT 255 03/17/2015 0608   MCV 81.2 02/26/2020 1357    MCV 92 03/17/2015 0608   MCH 26.8 11/06/2019 1234   MCHC 33.1 02/26/2020 1357   RDW 14.6 02/26/2020 1357   RDW 12.3 03/17/2015 0608   LYMPHSABS 2.0 02/26/2020 1357   LYMPHSABS 0.3 (L) 03/17/2015 0608   MONOABS 0.5 02/26/2020 1357   MONOABS 0.3 03/17/2015 0608   EOSABS 0.2 02/26/2020 1357   EOSABS 0.0 03/17/2015 0608   BASOSABS 0.1 02/26/2020 1357   BASOSABS 0.0 03/17/2015 0608    CMP     Component Value Date/Time   NA 135 07/10/2020 1221   NA 140 03/17/2015 0608   K 4.1 07/10/2020 1221   K 3.5 03/17/2015 0608   CL 98 07/10/2020 1221   CL 103 03/17/2015 0608   CO2 30 07/10/2020 1221   CO2 26 03/17/2015 0608   GLUCOSE 92 07/10/2020 1221   GLUCOSE 126 (H) 03/17/2015 0608   BUN 17 07/10/2020 1221   BUN 13 03/17/2015 0608   CREATININE 0.72 07/10/2020 1221   CREATININE 0.83 09/21/2018 1234   CREATININE 0.72 03/17/2015 0608   CALCIUM 9.6 07/10/2020 1221   CALCIUM 8.8 (L) 03/17/2015 0608   PROT 7.1 02/26/2020 1357   PROT 7.6 03/17/2015 0608   ALBUMIN  4.4 02/26/2020 1357   ALBUMIN 4.4 03/17/2015 0608   AST 19 02/26/2020 1357   AST 22 09/21/2018 1234   ALT 20 02/26/2020 1357   ALT 32 09/21/2018 1234   ALT 23 03/17/2015 0608   ALKPHOS 148 (H) 02/26/2020 1357   ALKPHOS 48 03/17/2015 0608   BILITOT 0.5 02/26/2020 1357   BILITOT 0.6 09/21/2018 1234   GFRNONAA >60 11/06/2019 1234   GFRNONAA >60 09/21/2018 1234   GFRNONAA >60 03/17/2015 0608   GFRAA >60 11/06/2019 1234   GFRAA >60 09/21/2018 1234   GFRAA >60 03/17/2015 2542    Assessment: 1.  GERD: Reflux symptoms and history of reflux on EGD in 2018, controlled with Famotidine 40 twice daily and Pantoprazole 40 daily 2.  Change in bowel habits: Towards constipation after last colonoscopy in 2018, slowly returning to normal per patient  Plan: 1.  Discussed with patient that she can try coming off of her Pantoprazole 40 mg daily, especially since she is on Pepcid 40 twice a day.  Discussed going to every other day dosing  and then every third day dosing and then stopping.  Explained that if she has uncontrollable symptoms that would recommend that she stay on this medication long-term. 2.  Patient does follow with PCP and has yearly bone density scans. 3.  Discussed change in bowel habits, this could have been due to a change in flora.  Recommend that she go ahead and start a probiotic either Hardin Negus' colon health or Align once daily for the next 2 months. 4.  Patient to follow in clinic with [place] in 2023 for her surveillance colonoscopy or sooner if necessary.  She was assigned to Dr. Tarri Glenn today per her request.  Ellouise Newer, PA-C [facility] 07/23/2020, 11:11 AM  Cc: Tower, [name], MD

## 2020-07-23 NOTE — Patient Instructions (Signed)
If you are age 64 or older, your body mass index should be between 23-30. Your Body mass index is 26.05 kg/m. If this is out of the aforementioned range listed, please consider follow up with your Primary Care Provider.  If you are age 59 or younger, your body mass index should be between 19-25. Your Body mass index is 26.05 kg/m. If this is out of the aformentioned range listed, please consider follow up with your Primary Care Provider.   Start probiotic daily either [employer] or [employer]  Follow up as needed.

## 2020-07-25 NOTE — Progress Notes (Signed)
Reviewed and agree with management plans. ? ?Darriana Deboy L. Aero Drummonds, MD, MPH  ?

## 2020-07-30 ENCOUNTER — Ambulatory Visit
Admission: RE | Admit: 2020-07-30 | Discharge: 2020-07-30 | Disposition: A | Discharge status: Home / Self Care | Payer: BC Managed Care – PPO | Source: Ambulatory Visit | Attending: Family Medicine | Admitting: Family Medicine

## 2020-07-30 ENCOUNTER — Other Ambulatory Visit: Payer: Self-pay

## 2020-07-30 DIAGNOSIS — J341 Cyst and mucocele of nose and nasal sinus: Secondary | ICD-10-CM | POA: Diagnosis not present

## 2020-07-30 DIAGNOSIS — G4459 Other complicated headache syndrome: Secondary | ICD-10-CM

## 2020-07-30 DIAGNOSIS — R42 Dizziness and giddiness: Secondary | ICD-10-CM | POA: Diagnosis not present

## 2020-07-30 DIAGNOSIS — R110 Nausea: Secondary | ICD-10-CM | POA: Diagnosis not present

## 2020-07-30 MED ORDER — GADOBUTROL 1 MMOL/ML IV SOLN
6.0000 mL | Freq: Once | INTRAVENOUS | Status: AC | PRN
  Administered 2020-07-30: 6 mL via INTRAVENOUS

## 2020-08-12 ENCOUNTER — Other Ambulatory Visit: Payer: Self-pay | Admitting: Adult Health

## 2020-08-12 DIAGNOSIS — Z9889 Other specified postprocedural states: Secondary | ICD-10-CM

## 2020-08-19 ENCOUNTER — Encounter: Payer: Self-pay | Admitting: Family Medicine

## 2020-08-19 DIAGNOSIS — R7303 Prediabetes: Secondary | ICD-10-CM

## 2020-08-21 MED ORDER — ACCU-CHEK MULTICLIX LANCETS MISC: 0 refills | Status: DC

## 2020-08-21 MED ORDER — TRUE METRIX BLOOD GLUCOSE TEST VI STRP: ORAL_STRIP | 0 refills | Status: DC

## 2020-08-21 NOTE — Telephone Encounter (Signed)
Pt called wanting to speak to someone about getting her rx.  She stated she has met her deductible and wanted to get rx for sept oct nov dec. Since she doesn't have to pay a copay    She stated it is not acceptable for her to wait until dr tower come in office on oct 1 to get rx

## 2020-08-21 NOTE — Telephone Encounter (Signed)
Jennifer [name],   Please contact patient and assist her with her refill request for her BS strips.  I am not sure if this is a default message that is being sent without your awareness.  But, this appears to me to be a reasonable request to resolve for patient as she is compliant with current, regular office visits and routine care.   Please call patient and reassure her that you will fill her testing strips and lancets per our standing refill rx policy.   Let me know if you have any questions.    Thanks.

## 2020-08-21 NOTE — Addendum Note (Signed)
Addended by: Tammi Sou on: 08/21/2020 05:04 PM   Modules accepted: Orders

## 2020-08-23 DIAGNOSIS — D1803 Hemangioma of intra-abdominal structures: Secondary | ICD-10-CM

## 2020-08-23 DIAGNOSIS — K7689 Other specified diseases of liver: Secondary | ICD-10-CM

## 2020-08-23 HISTORY — DX: Other specified diseases of liver: K76.89

## 2020-08-23 HISTORY — DX: Hemangioma of intra-abdominal structures: D18.03

## 2020-08-26 ENCOUNTER — Other Ambulatory Visit: Payer: Self-pay | Admitting: Family Medicine

## 2020-08-27 ENCOUNTER — Emergency Department
Admission: EM | Admit: 2020-08-27 | Discharge: 2020-08-28 | Disposition: A | Discharge status: Home / Self Care | Payer: BC Managed Care – PPO | Attending: Emergency Medicine | Admitting: Emergency Medicine

## 2020-08-27 ENCOUNTER — Emergency Department: Payer: BC Managed Care – PPO

## 2020-08-27 DIAGNOSIS — R1011 Right upper quadrant pain: Secondary | ICD-10-CM | POA: Diagnosis not present

## 2020-08-27 DIAGNOSIS — E039 Hypothyroidism, unspecified: Secondary | ICD-10-CM | POA: Diagnosis not present

## 2020-08-27 DIAGNOSIS — Z853 Personal history of malignant neoplasm of breast: Secondary | ICD-10-CM | POA: Insufficient documentation

## 2020-08-27 DIAGNOSIS — R935 Abnormal findings on diagnostic imaging of other abdominal regions, including retroperitoneum: Secondary | ICD-10-CM | POA: Diagnosis not present

## 2020-08-27 DIAGNOSIS — K831 Obstruction of bile duct: Secondary | ICD-10-CM

## 2020-08-27 DIAGNOSIS — K838 Other specified diseases of biliary tract: Secondary | ICD-10-CM

## 2020-08-27 DIAGNOSIS — Z79899 Other long term (current) drug therapy: Secondary | ICD-10-CM | POA: Diagnosis not present

## 2020-08-27 DIAGNOSIS — R42 Dizziness and giddiness: Secondary | ICD-10-CM | POA: Diagnosis not present

## 2020-08-27 LAB — COMPREHENSIVE METABOLIC PANEL
ALT: 21 U/L (ref 0–44)
AST: 22 U/L (ref 15–41)
Albumin: 4.2 g/dL (ref 3.5–5.0)
Alkaline Phosphatase: 116 U/L (ref 38–126)
Anion gap: 10 (ref 5–15)
BUN: 16 mg/dL (ref 8–23)
CO2: 26 mmol/L (ref 22–32)
Calcium: 9.2 mg/dL (ref 8.9–10.3)
Chloride: 98 mmol/L (ref 98–111)
Creatinine, Ser: 0.69 mg/dL (ref 0.44–1.00)
GFR calc non Af Amer: >60 mL/min (ref >60)
Glucose, Bld: 94 mg/dL (ref 70–99)
Potassium: 3.5 mmol/L (ref 3.5–5.1)
Sodium: 134 mmol/L — ABNORMAL LOW (ref 135–145)
Total Bilirubin: 0.8 mg/dL (ref 0.3–1.2)
Total Protein: 7.3 g/dL (ref 6.5–8.1)

## 2020-08-27 LAB — URINALYSIS, COMPLETE (UACMP) WITH MICROSCOPIC
Bacteria, UA: NONE SEEN
Bilirubin Urine: NEGATIVE
Glucose, UA: NEGATIVE mg/dL
Hgb urine dipstick: NEGATIVE
Ketones, ur: NEGATIVE mg/dL
Leukocytes,Ua: NEGATIVE
Nitrite: NEGATIVE
Protein, ur: NEGATIVE mg/dL
Specific Gravity, Urine: 1.002 — ABNORMAL LOW (ref 1.005–1.030)
Squamous Epithelial / HPF: NONE SEEN (ref 0–5)
pH: 6 (ref 5.0–8.0)

## 2020-08-27 LAB — CBC
HCT: 34 % — ABNORMAL LOW (ref 36.0–46.0)
Hemoglobin: 10.8 g/dL — ABNORMAL LOW (ref 12.0–15.0)
MCH: 25.5 pg — ABNORMAL LOW (ref 26.0–34.0)
MCHC: 31.8 g/dL (ref 30.0–36.0)
MCV: 80.4 fL (ref 80.0–100.0)
Platelets: 295 10*3/uL (ref 150–400)
RBC: 4.23 MIL/uL (ref 3.87–5.11)
RDW: 14.3 % (ref 11.5–15.5)
WBC: 6 10*3/uL (ref 4.0–10.5)
nRBC: 0 % (ref 0.0–0.2)

## 2020-08-27 LAB — LIPASE, BLOOD: Lipase: 31 U/L (ref 11–51)

## 2020-08-27 LAB — TROPONIN I (HIGH SENSITIVITY): Troponin I (High Sensitivity): 3 ng/L (ref <18)

## 2020-08-27 NOTE — ED Triage Notes (Addendum)
PT to ED via POV c/o abd pain, nausea with no vomiting, bloating, and low back pain. No diarrhea, urinary sx or fever. Last BM yesterday.

## 2020-08-27 NOTE — ED Notes (Addendum)
Pt awaiting [place] of abd.

## 2020-08-27 NOTE — ED Provider Notes (Signed)
[facility] Emergency Department Provider Note  ____________________________________________   First MD Initiated Contact with Patient 08/27/20 2313     (approximate)  I have reviewed the triage vital signs and the nursing notes.   HISTORY  Chief Complaint Abdominal Pain and Back Pain    HPI Jennifer [name] is a 64 y.o. female with below list of previous medical conditions including breast cancer 2 years ago presents to the emergency department secondary to intermittent upper abdominal discomfort with associated bloating and nausea after eating pizza yesterday and subsequently again today.  Patient denies any vomiting.  Patient denies any diarrhea no fever.  Patient denies any urinary symptoms.  Current pain score mild.       Past Medical History:  Diagnosis Date  . Allergic rhinitis   . Anxiety   . Arthritis    hands, knees  . Breast cancer ([place]) 2019   Right Breast Cancer  . Cancer ([place]) 09/2018   Breast CA new DX, right breast  . Cervical dysplasia   . Endometriosis   . Esophageal reflux   . Family history of adverse reaction to anesthesia    younger sister anxiety for a dew days after  . Family history of breast cancer   . Family history of breast cancer   . Family history of prostate cancer   . Hypothyroid   . Migraine with aura, without mention of intractable migraine without mention of status migrainosus   . Osteopenia 08/2019   T score -2.2 FRAX 11% / 1.7%  . Personal history of radiation therapy 2019   Right Breast Cancer  . Pre-diabetes     Patient Active Problem List   Diagnosis Date Noted  . Weakness of right foot 07/10/2020  . Dizziness 07/10/2020  . History of breast cancer 03/01/2020  . Genetic testing 11/14/2019  . Bilateral hand pain 11/03/2019  . Family history of prostate cancer   . Adnexal cyst 09/26/2019  . Cystitis with hematuria 09/26/2019  . Pain of left calf 03/21/2019  . Use of anastrozole (Arimidex)  03/21/2019  . Elevated transaminase level 01/03/2019  . Family history of breast cancer   . Stress reaction 09/26/2018  . Malignant neoplasm of upper-inner quadrant of right breast in female, estrogen receptor positive ([place]) 09/20/2018  . B12 deficiency 04/13/2018  . Joint pain 04/11/2018  . Paresthesia 04/11/2018  . Colon cancer screening 09/16/2017  . Prediabetes 07/28/2016  . Allergic rhinitis 11/13/2014  . Screening for lipoid disorders 09/26/2014  . Preventative health care 09/26/2014  . Routine general medical examination at a health care facility 10/28/2012  . Herpes labialis 04/28/2011  . GERD 09/16/2010  . Hypothyroidism 05/07/2008  . Osteopenia 05/07/2008  . Migraine with aura 11/11/2007    Past Surgical History:  Procedure Laterality Date  . BREAST EXCISIONAL BIOPSY Bilateral over 10 years ago   benign x 2   . BREAST LUMPECTOMY Right 10/10/2018  . BREAST LUMPECTOMY WITH RADIOACTIVE SEED AND SENTINEL LYMPH NODE BIOPSY Right 10/10/2018   Procedure: RIGHT BREAST LUMPECTOMY WITH RADIOACTIVE SEED AND RIGHT SENTINEL LYMPH NODE BIOPSY;  Surgeon: Jovita Kussmaul, MD;  Location: [facility];  Service: General;  Laterality: Right;  . BREAST SURGERY     Benign breast lump excised  . CERVICAL CONE BIOPSY  1985   severe dysplasia  . COLONOSCOPY  2005 and dec 2019   normal  . COLPOSCOPY    . endoscopy  10/2018  . LAPAROSCOPIC ASSISTED VAGINAL HYSTERECTOMY    .  LAPAROSCOPIC BILATERAL SALPINGO OOPHERECTOMY Bilateral 10/27/2019   Procedure: LAPAROSCOPIC BILATERAL SALPINGO OOPHORECTOMY WITH PERITONEAL WASHINGS;  Surgeon: Princess Bruins, MD;  Location: [facility];  Service: Gynecology;  Laterality: Bilateral;  request 1:00pm on Friday, Dec. 4th in [place] time held requests one hour OR time  . moles removed from upper back, face lft, 1 between breasts, upper leg left inside  10/18/2019   wearing small round bandaids on for 2 weeks  .  ROTATOR CUFF REPAIR Bilateral 08/2008    Prior to Admission medications   Medication Sig Start Date End Date Taking? Authorizing Provider  ALPRAZolam (XANAX) 0.5 MG tablet TAKE 1/2 TO 1 TABLET BY MOUTH ONCE DAILY AS NEEDED FOR SLEEP/ANXIETY 06/25/20   Tower, Marne A, MD  atenolol (TENORMIN) 25 MG tablet Take 12.5 mg by mouth 3 (three) times daily. For migraine prevention    [provider]  Calcium Carbonate-Vitamin D 600-400 MG-UNIT tablet Take 2 tablets by mouth daily. Takes 2 in am 1 in pm    [provider]  clobetasol ointment (TEMOVATE) 2.35 % Apply 1 application topically 2 (two) times daily. Avoid applying to face, groin, and axilla. Use as directed. 07/04/20   Moye, [place], MD  cyanocobalamin 2000 MCG tablet Take 2,000 mcg by mouth. 3 x week fri, sat, sunday    [provider]  famotidine (PEPCID) 40 MG tablet Take 40 mg by mouth 2 (two) times daily.     [provider]  glucose blood (TRUE METRIX BLOOD GLUCOSE TEST) test strip Use to check blood sugar two times a day. (dx. R73.03) 08/21/20   Tower, [name], MD  hydrocortisone 2.5 % cream Apply topically 2 (two) times daily as needed (Rash). 07/04/20   Moye, [place], MD  hydrOXYzine (ATARAX/VISTARIL) 25 MG tablet Take 1 tablet (25 mg total) by mouth 3 (three) times daily as needed. Patient taking differently: Take 25 mg by mouth 2 (two) times daily.  10/18/17   Triplett, Johnette Abraham B, FNP  Lancets (ACCU-CHEK MULTICLIX) lancets Use to check blood sugar two times a day. (dx.  R73.03) 08/21/20   Tower, [name], MD  letrozole [facility]) 2.5 MG tablet Take 2.5 mg by mouth daily.    [provider]  levocetirizine (XYZAL) 5 MG tablet Take 1 tablet by mouth every evening. 10/28/17   [provider]  levothyroxine (SYNTHROID) 75 MCG tablet TAKE ONE TABLET BY MOUTH ONE TIME DAILY  before breakfast 02/28/20   Tower, [name], MD  Magnesium Oxide 400 (240 Mg) MG TABS Take by mouth.    [provider]    Misc Natural Products (GLUCOSAMINE CHOND COMPLEX/MSM) TABS Take 1 tablet by mouth daily.     [provider]  Multiple Vitamin (MULTIVITAMIN WITH MINERALS) TABS tablet Take 1 tablet by mouth daily.    [provider]  nabumetone (RELAFEN) 500 MG tablet Take 500 mg by mouth daily as needed (migraines).     [provider]  Omega-3 Fatty Acids (FISH OIL) 1000 MG CAPS Take 1,000 mg by mouth daily.     [provider]  OnabotulinumtoxinA (BOTOX IJ) Inject 1 Dose as directed every 6 (six) weeks.     [provider]  ondansetron (ZOFRAN ODT) 4 MG disintegrating tablet Take 1 tablet (4 mg total) by mouth every 8 (eight) hours as needed. 11/03/18   Menshew, Dannielle Karvonen, PA-C  pantoprazole (PROTONIX) 40 MG tablet TAKE ONE TABLET BY MOUTH ONE TIME DAILY 08/27/20   Tower,  [name], MD  Probiotic Product (PROBIOTIC-10 PO) Take 1 capsule by mouth daily.     [provider]  promethazine (PHENERGAN) 25 MG tablet Take 12.5 mg by mouth daily as needed for nausea or vomiting. When having a migraine    [provider]  rizatriptan (MAXALT) 10 MG tablet Take 1 tablet (10 mg total) by mouth once as needed for up to 10 days for migraine. May repeat in 2 hours if needed 11/03/18 10/25/19  Menshew, Dannielle Karvonen, PA-C  TURMERIC PO Take 1 tablet by mouth daily.     [provider]    Allergies Ciprofloxacin, Codeine phosphate, Decongestant [pseudoephedrine], Flonase [fluticasone propionate], Hydrocodone, Reglan [metoclopramide], Topamax [topiramate], Benadryl [diphenhydramine hcl], and Sulfa antibiotics  Family History  Problem Relation Age of Onset  . Breast cancer Mother 57  . Diabetes Mother   . Hypertension Father   . Heart disease Father   . Osteoporosis Maternal Grandmother   . Diabetes Maternal Grandmother   . Hypertension Sister   . COPD Paternal Grandmother   . Heart disease Paternal Grandfather   . Breast cancer Cousin 7        pat first cousin  . Breast cancer Cousin        mat second cousin with breast cancer in her 69s  . Prostate cancer Other 72  . Thyroid cancer Cousin 48       pat first cousin    Social History Social History   Tobacco Use  . Smoking status: Never Smoker  . Smokeless tobacco: Never Used  Vaping Use  . Vaping Use: Never used  Substance Use Topics  . Alcohol use: No    Alcohol/week: 0.0 standard drinks  . Drug use: No    Review of Systems Constitutional: No fever/chills Eyes: No visual changes. ENT: No sore throat. Cardiovascular: Denies chest pain. Respiratory: Denies shortness of breath. Gastrointestinal: Positive for abdominal pain and nausea.  No vomiting, no diarrhea.  No constipation. Genitourinary: Negative for dysuria. Musculoskeletal: Negative for neck pain.  Negative for back pain. Integumentary: Negative for rash. Neurological: Negative for headaches, focal weakness or numbness.   ____________________________________________   PHYSICAL EXAM:  VITAL SIGNS: ED Triage Vitals [08/27/20 2046]  Enc Vitals Group     BP 132/73     Pulse Rate 66     Resp 19     Temp 98.2 F (36.8 C)     Temp Source Oral     SpO2 100 %     Weight      Height      Head Circumference      Peak Flow      Pain Score      Pain Loc      Pain Edu?      Excl. in [place]?     Constitutional: Alert and oriented.  Eyes: Conjunctivae are normal.  Head: Atraumatic. Mouth/Throat: Patient is wearing a mask. Neck: No stridor.  No meningeal signs.   Cardiovascular: Normal rate, regular rhythm. Good peripheral circulation. Grossly normal heart sounds. Respiratory: Normal respiratory effort.  No retractions. Gastrointestinal: Epigastric right upper quadrant tenderness to palpation.. No distention.  Musculoskeletal: No lower extremity tenderness nor edema. No gross deformities of extremities. Neurologic:  Normal speech and language. No gross focal neurologic deficits are appreciated.  Skin:   Skin is warm, dry and intact. Psychiatric: Mood and affect are normal. Speech and behavior are normal.  ____________________________________________   LABS (all labs ordered are listed, but only  abnormal results are displayed)  Labs Reviewed  COMPREHENSIVE METABOLIC PANEL - Abnormal; Notable for the following components:      Result Value   Sodium 134 (*)    All other components within normal limits  CBC - Abnormal; Notable for the following components:   Hemoglobin 10.8 (*)    HCT 34.0 (*)    MCH 25.5 (*)    All other components within normal limits  URINALYSIS, COMPLETE (UACMP) WITH MICROSCOPIC - Abnormal; Notable for the following components:   Color, Urine COLORLESS (*)    APPearance CLEAR (*)    Specific Gravity, Urine 1.002 (*)    All other components within normal limits  LIPASE, BLOOD  TROPONIN I (HIGH SENSITIVITY)   ____________________________________________  EKG  ED ECG REPORT I, Sobieski N Shaneal Barasch, the attending physician, personally viewed and interpreted this ECG.   Date: 08/27/2020  EKG Time: 8:48 PM  Rate: 60  Rhythm: Normal Sinus Rhythm  Axis: Normal  Intervals:Normal  ST&T Change: None  ____________________________________________  RADIOLOGY I, Polk N Zohar Maroney, personally viewed and evaluated these images (plain radiographs) as part of my medical decision making, as well as reviewing the written report by the radiologist.    Official radiology report(s): CT ABDOMEN PELVIS W CONTRAST  Result Date: 08/28/2020 CLINICAL DATA:  upper abdominal pain EXAM: CT ABDOMEN AND PELVIS WITH CONTRAST TECHNIQUE: Multidetector CT imaging of the abdomen and pelvis was performed using the standard protocol following bolus administration of intravenous contrast. CONTRAST:  165mL OMNIPAQUE IOHEXOL 300 MG/ML  SOLN COMPARISON:  September 18, 2019 FINDINGS: Lower chest: The visualized heart size within normal limits. No pericardial fluid/thickening. No hiatal hernia. Patchy  airspace opacity seen in the anterior right lung base. Hepatobiliary: Scattered hypodense lesions are seen throughout the liver parenchyma some which appear to be present on the prior noncontrast study of September 18, 2019. The largest in the caudate lobe measuring 1.5 cm, likely hepatic cyst.The main portal vein is patent. No evidence of calcified gallstones, or gallbladder wall thickening. There is mild common bile duct dilatation measuring up to 1 cm in transverse dimension with tapers at the level of the ampulla. No definite shadowing stones are seen. Mild central intrahepatic biliary ductal dilatation is noted. No pancreatic ductal dilatation is seen. Pancreas: Unremarkable. No pancreatic ductal dilatation or surrounding inflammatory changes. Spleen: Normal in size without focal abnormality. Adrenals/Urinary Tract: Both adrenal glands appear normal. The kidneys and collecting system appear normal without evidence of urinary tract calculus or hydronephrosis. Bladder is unremarkable. Stomach/Bowel: The stomach, small bowel, and colon are normal in appearance. No inflammatory changes, wall thickening, or obstructive findings.There is a moderate amount of colonic stool present. Vascular/Lymphatic: There are no enlarged mesenteric, retroperitoneal, or pelvic lymph nodes. No significant vascular findings are present. Reproductive: Scattered coarse calcifications are seen within the uterus. Other: No evidence of abdominal wall mass or hernia. Musculoskeletal: No acute or significant osseous findings. IMPRESSION: Mild common bile duct dilatation to the level of the ampulla. No shadowing stones however are noted. This is nonspecific and if further evaluation is required would recommend nonemergent MRCP/MRI Small focus of patchy airspace opacity at the anterior right lung base which could be due to atelectasis and/or early infectious etiology. Moderate to large amount of colonic stool without evidence of obstruction.  Electronically Signed   By: Prudencio Pair M.D.   On: 08/28/2020 01:30   MR 3D Recon At Scanner  Result Date: 08/28/2020 CLINICAL DATA:  Abdominal pain biliary obstruction EXAM: MRI ABDOMEN  WITHOUT AND WITH CONTRAST (INCLUDING MRCP) TECHNIQUE: Multiplanar multisequence MR imaging of the abdomen was performed both before and after the administration of intravenous contrast. Heavily T2-weighted images of the biliary and pancreatic ducts were obtained, and three-dimensional MRCP images were rendered by post processing. CONTRAST:  57mL GADAVIST GADOBUTROL 1 MMOL/ML IV SOLN COMPARISON:  CT same day FINDINGS: Lower chest: The visualized heart size within normal limits. No pericardial fluid/thickening. No hiatal hernia. The visualized portions of the lungs are clear. Hepatobiliary: Throughout the liver parenchyma there are multilobular T2 bright cystic lesions seen throughout the largest within the medial right liver lobe measuring 1.4 cm. There is a T2 bright cystic lesion in the posterior right liver lobe which shows so contrast enhancement on the delayed images which measures 7 mm, series 17, image 21, likely hemangioma. There is a mildly dilated intrahepatic and common bile duct dilatation to the level of the distal common bile duct near the level of the ampulla where there is a focal area of narrowing. The common bile duct measures up to 8 mm in transverse dimension. No definite shadowing stones are seen. No pancreatic ductal dilatation is seen. Pancreas: Unremarkable. No pancreatic ductal dilatation or surrounding inflammatory changes. Spleen: Normal in size without focal abnormality. Adrenals/Urinary Tract: Both adrenal glands appear normal. The kidneys and collecting system appear normal without evidence of urinary tract calculus or hydronephrosis. Stomach/Bowel: The stomach, small bowel, and visualized portions of the colon are unremarkable. No surrounding inflammatory changes or wall thickening.  Vascular/Lymphatic: There are no enlarged mesenteric, retroperitoneal, or pelvic lymph nodes. No significant vascular findings are present. Other: No evidence of abdominal wall mass or hernia. Musculoskeletal: Normal osseous marrow signal is seen throughout. IMPRESSION: Mild intrahepatic and common bile duct dilatation to the level of the distal common bile duct/ampulla where there is a focal area of narrowing. This could be due to a focal area of stricturing. No choledocholithiasis is seen. Multiple intrahepatic cyst 7 mm intrahepatic hemangioma in the posterior right liver lobe. Electronically Signed   By: Prudencio Pair M.D.   On: 08/28/2020 03:13   MR ABDOMEN MRCP W WO CONTAST  Result Date: 08/28/2020 CLINICAL DATA:  Abdominal pain biliary obstruction EXAM: MRI ABDOMEN WITHOUT AND WITH CONTRAST (INCLUDING MRCP) TECHNIQUE: Multiplanar multisequence MR imaging of the abdomen was performed both before and after the administration of intravenous contrast. Heavily T2-weighted images of the biliary and pancreatic ducts were obtained, and three-dimensional MRCP images were rendered by post processing. CONTRAST:  80mL GADAVIST GADOBUTROL 1 MMOL/ML IV SOLN COMPARISON:  CT same day FINDINGS: Lower chest: The visualized heart size within normal limits. No pericardial fluid/thickening. No hiatal hernia. The visualized portions of the lungs are clear. Hepatobiliary: Throughout the liver parenchyma there are multilobular T2 bright cystic lesions seen throughout the largest within the medial right liver lobe measuring 1.4 cm. There is a T2 bright cystic lesion in the posterior right liver lobe which shows so contrast enhancement on the delayed images which measures 7 mm, series 17, image 21, likely hemangioma. There is a mildly dilated intrahepatic and common bile duct dilatation to the level of the distal common bile duct near the level of the ampulla where there is a focal area of narrowing. The common bile duct measures up  to 8 mm in transverse dimension. No definite shadowing stones are seen. No pancreatic ductal dilatation is seen. Pancreas: Unremarkable. No pancreatic ductal dilatation or surrounding inflammatory changes. Spleen: Normal in size without focal abnormality. Adrenals/Urinary Tract: Both adrenal  glands appear normal. The kidneys and collecting system appear normal without evidence of urinary tract calculus or hydronephrosis. Stomach/Bowel: The stomach, small bowel, and visualized portions of the colon are unremarkable. No surrounding inflammatory changes or wall thickening. Vascular/Lymphatic: There are no enlarged mesenteric, retroperitoneal, or pelvic lymph nodes. No significant vascular findings are present. Other: No evidence of abdominal wall mass or hernia. Musculoskeletal: Normal osseous marrow signal is seen throughout. IMPRESSION: Mild intrahepatic and common bile duct dilatation to the level of the distal common bile duct/ampulla where there is a focal area of narrowing. This could be due to a focal area of stricturing. No choledocholithiasis is seen. Multiple intrahepatic cyst 7 mm intrahepatic hemangioma in the posterior right liver lobe. Electronically Signed   By: Prudencio Pair M.D.   On: 08/28/2020 03:13   US ABDOMEN LIMITED RUQ  Result Date: 08/28/2020 CLINICAL DATA:  Right upper quadrant pain EXAM: ULTRASOUND ABDOMEN LIMITED RIGHT UPPER QUADRANT COMPARISON:  None. FINDINGS: Gallbladder: No gallstones or wall thickening visualized. No sonographic Murphy sign noted by sonographer. Common bile duct: Diameter: 7.1 mm proximally and distally 12 mm. No visualized stones. Liver: There is a complex cyst seen within the right liver lobe measuring 1.9 x 1.2 x 1.5 cm within internal septation. Foot within normal limits in parenchymal echogenicity. Portal vein is patent on color Doppler imaging with normal direction of blood flow towards the liver. Other: None. IMPRESSION: Mildly dilated common bile duct without  shadowing stones. Normal appearing gallbladder. Electronically Signed   By: Prudencio Pair M.D.   On: 08/28/2020 00:41    Procedures   ____________________________________________   INITIAL IMPRESSION / MDM / ASSESSMENT AND PLAN / ED COURSE  As part of my medical decision making, I reviewed the following data within the [place] NUMBER   64 year old female presented with above-stated history and physical exam a differential diagnosis including but not limited to gastritis, gallbladder disease, pancreatitis.  As such right upper quadrant ultrasound performed which revealed a mildly dilated common bile duct.  CT scan of the abdomen pelvis performed revealed the same.  MRCP performed revealed no evidence of choledocholithiasis but did reveal a common bile duct of 8 mm intrahepatic and common bile duct dilation down to the ampulla.  Without discomfort at present no nausea or vomiting.  Laboratory data including liver enzymes normal.  Patient referred to Dr. Allen Norris gastroenterologist for further outpatient evaluation.  ____________________________________________  FINAL CLINICAL IMPRESSION(S) / ED DIAGNOSES  Final diagnoses:  RUQ pain  Common bile duct dilatation     MEDICATIONS GIVEN DURING THIS VISIT:  Medications  iohexol (OMNIPAQUE) 300 MG/ML solution 100 mL (100 mLs Intravenous Contrast Given 08/28/20 0117)  gadobutrol (GADAVIST) 1 MMOL/ML injection 6 mL (6 mLs Intravenous Contrast Given 08/28/20 0256)     ED Discharge Orders    None      *Please note:  Shanell Aden was evaluated in Emergency Department on 08/29/2020 for the symptoms described in the history of present illness. She was evaluated in the context of the global COVID-19 pandemic, which necessitated consideration that the patient might be at risk for infection with the SARS-CoV-2 virus that causes COVID-19. Institutional protocols and algorithms that pertain to the evaluation of patients at risk for COVID-19 are  in a state of rapid change based on information released by regulatory bodies including the CDC and federal and state organizations. These policies and algorithms were followed during the patient's care in the ED.  Some ED evaluations and interventions may  be delayed as a result of limited staffing during and after the pandemic.*  Note:  This document was prepared using Dragon voice recognition software and may include unintentional dictation errors.   Gregor Hams, MD 08/29/20 [phone]

## 2020-08-27 NOTE — ED Notes (Signed)
US at bedside

## 2020-08-27 NOTE — ED Notes (Signed)
Pt in with co upper abd pain and distention since yesterday. States started after eating pizza. Denies any vomiting or diarrhea, but did have nausea. Pt denies any hx of the same.

## 2020-08-28 ENCOUNTER — Emergency Department: Payer: BC Managed Care – PPO

## 2020-08-28 ENCOUNTER — Encounter: Payer: Self-pay | Admitting: Radiology

## 2020-08-28 DIAGNOSIS — R935 Abnormal findings on diagnostic imaging of other abdominal regions, including retroperitoneum: Secondary | ICD-10-CM | POA: Diagnosis not present

## 2020-08-28 DIAGNOSIS — K838 Other specified diseases of biliary tract: Secondary | ICD-10-CM | POA: Diagnosis not present

## 2020-08-28 MED ORDER — GADOBUTROL 1 MMOL/ML IV SOLN
6.0000 mL | Freq: Once | INTRAVENOUS | Status: AC | PRN
  Administered 2020-08-28: 6 mL via INTRAVENOUS

## 2020-08-28 MED ORDER — IOHEXOL 300 MG/ML  SOLN
100.0000 mL | Freq: Once | INTRAMUSCULAR | Status: AC | PRN
  Administered 2020-08-28: 100 mL via INTRAVENOUS

## 2020-08-28 NOTE — ED Notes (Signed)
Pt screened for mri

## 2020-08-28 NOTE — ED Notes (Signed)
Pt to CT

## 2020-08-28 NOTE — ED Notes (Signed)
Pt awaiting MR of the abd

## 2020-08-28 NOTE — ED Notes (Signed)
Pt to MRI with this RN

## 2020-08-28 NOTE — ED Notes (Signed)
Pt to MRI

## 2020-08-29 ENCOUNTER — Telehealth: Payer: Self-pay | Admitting: Physician Assistant

## 2020-08-29 NOTE — Telephone Encounter (Signed)
Pt is requesting a call back from a nurse to discuss her bile stricture. Pt would like to be seen sooner with a provider and not a PA.

## 2020-08-30 NOTE — Telephone Encounter (Signed)
Lm on vm for patient to retun call

## 2020-08-30 NOTE — Telephone Encounter (Signed)
She was a long-term patient of [facility] clinic gastroenterology until she just transferred care here and was established with Ellouise Newer and assigned to Dr. Tarri Glenn MD.  I am not sure how this message got to me however this is what I recommend:   Her LFTs are completely normal.  MRI suggests a slightly dilated bile duct at 8 mm.  Please offer her follow-up with Dr. Tarri Glenn or one of the extenders.  Would repeat LFTs at that visit.  If she has significant abdominal pains prior to then she needs to have LFTs checked.  Otherwise I am happy to discuss all this with her at the office visit in early December.

## 2020-08-30 NOTE — Telephone Encounter (Signed)
Spoke with Jennifer [name] and she is aware of recommendations and OV scheduled with Dr. Tarri Glenn on 10/25/20 @3pm . Jennifer [name] has a history of breast cancer and is 2 years out from treatment. She is terrified and wants to be sure that she does not have cancer of the bile duct. States her husband passed away from bile duct cancer. She wants to make sure waiting to be seen on 10/24/20 is not to long. Please advise.

## 2020-08-30 NOTE — Telephone Encounter (Signed)
I agree with Dr. Eugenia Pancoast recommendations after reviewing her recent MRCP. Patient has follow-up with me in early December. Please place on cancellation list in the event an earlier appointment becomes available. Thank you.

## 2020-08-30 NOTE — Telephone Encounter (Signed)
Sent to Dr. Ardis Hughs by mistake. Dr. Payton Emerald patient, please see below. Thank You  Will send to JR to have rescheduled

## 2020-08-30 NOTE — Telephone Encounter (Signed)
Spoke with patient, pt recently seen in the ED for bile duct stricture. Dr. Ardis Hughs, patient would like for you to review ED notes and advise on any further recommendations. She is scheduled for your next available appt on 10/25/20 at 10:30 am, pt does not want to see an APP. Please advise, thank you

## 2020-08-30 NOTE — Telephone Encounter (Signed)
I have reviewed her MRCP and do not see any cancer on the study. We will work with her to further evaluated as indicated. Please place her on a cancellation list for me of the APPs for an earlier appointment.

## 2020-08-30 NOTE — Telephone Encounter (Signed)
Pt aware, appt placed on cancellation list.

## 2020-09-01 ENCOUNTER — Encounter: Payer: Self-pay | Admitting: Family Medicine

## 2020-09-02 DIAGNOSIS — G43719 Chronic migraine without aura, intractable, without status migrainosus: Secondary | ICD-10-CM | POA: Diagnosis not present

## 2020-09-04 ENCOUNTER — Encounter: Payer: Self-pay | Admitting: *Deleted

## 2020-09-05 ENCOUNTER — Ambulatory Visit (INDEPENDENT_AMBULATORY_CARE_PROVIDER_SITE_OTHER): Payer: BC Managed Care – PPO | Admitting: Gastroenterology

## 2020-09-05 ENCOUNTER — Other Ambulatory Visit (INDEPENDENT_AMBULATORY_CARE_PROVIDER_SITE_OTHER): Payer: BC Managed Care – PPO

## 2020-09-05 ENCOUNTER — Encounter: Payer: Self-pay | Admitting: Gastroenterology

## 2020-09-05 VITALS — BP 110/62 | HR 68 | Ht 61.0 in | Wt 134.0 lb

## 2020-09-05 DIAGNOSIS — K839 Disease of biliary tract, unspecified: Secondary | ICD-10-CM

## 2020-09-05 DIAGNOSIS — R935 Abnormal findings on diagnostic imaging of other abdominal regions, including retroperitoneum: Secondary | ICD-10-CM | POA: Diagnosis not present

## 2020-09-05 LAB — HEPATIC FUNCTION PANEL
ALT: 19 U/L (ref 0–35)
AST: 18 U/L (ref 0–37)
Albumin: 4.5 g/dL (ref 3.5–5.2)
Alkaline Phosphatase: 133 U/L — ABNORMAL HIGH (ref 39–117)
Bilirubin, Direct: 0.1 mg/dL (ref 0.0–0.3)
Total Bilirubin: 0.4 mg/dL (ref 0.2–1.2)
Total Protein: 7.7 g/dL (ref 6.0–8.3)

## 2020-09-05 NOTE — H&P (View-Only) (Signed)
Referring Provider: Abner Greenspan, MD Primary Care Physician:  Tower, [name], MD  Chief complaint:  Slightly dilated bile duct   IMPRESSION:  Acute abdominal pain and back pain, now resolved Ongoing nausea Abnormal bile duct on CT and MRI/MRCP with normal liver enzymes Personal history of breast cancer Husband with bile duct cancer  Her LFTs were completely normal on evaluation in the ED with acute pain.  Acute abdominal and back pain has resolved. Nausea persists. I have personally reviewed the imagines obtained at the time of ER visit. CT and MRI/MRCP show mild intrahepatic and common bile duct dilatation to the level of the distal common bile duct/ampulla where there is a focal area of narrowing.   She is extremely concerned about the bile duct abnormality given the concern for cancer.    Will review candidacy for EUS with one of our biliary endoscopists. Repeat LFTs today.      PLAN: - Hepatic function test today - Will review benefits of outpatient EUS with one of our biliary endoscopists  Addendum: Will add CEA and Ca-19-9. Discussed with Dr. Ardis Hughs who will arrange for EUS.   Please see the "Patient Instructions" section for addition details about the plan.  HPI: Jennifer [name] is a 64 y.o. 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 She was evaluated in August 2021 by Ellouise Newer for reflux. Previously followed by the [facility] who performed an EGD 11/09/17 showing mild reflux, H pylori negative gastritis, and fundic gland polyps. Treated with pantoprazole 40 mg daily and famotidine 20 gm BID. She had a colonoscopy at the same time showing diverticulosis and hemorrhoids.  Surveillance colonoscopy recommended in 2023.   Recently evaluated in the ED 08/27/20 for nausea and upper abdominal discomfort after eating pizza. She called our office requesting urgent evaluation for ongoing symptoms. 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 Worked at [employer] for 20 years. 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    ED evaluation 08/27/20 included normal liver enzymes and normal lipase. Ultrasound showed a mildly dilated CBD without stones. The gallbladder appeared normal. CT abd/pelvis showed moderate amount of stool and mild common bile duct dilatation to the level of the ampulla. An MRI/MRCP showed  mild intrahepatic and common bile duct dilatation to the level of the distal common bile duct/ampulla where there is a focal area of narrowing and multiple intrahepatic cysts. There was also a 2mm hemangioma in the right liver lobe.  The radiologist suggested that this could be due to a focal area of stricturing. No choledocholithiasis is seen.  Given her history of breast cancer she is extremely concerned about. Her husband died of bile duct cancer 7 years ago and this has her even more concerned.   Intermittent nausea since the time of her ER evaluation. Abdominal pain and back pain have resolved.  Feels feel like food is digesting. Following a mediterranean diet. No identified food triggers. Using probiotics daily. Weight stable. Appetite is good.  No alcohol. Never smoker. No family history of pancreatic or biliary malignancy.   Requires PPI for reflux. Needs her H2Blocker for allergies.     Past Medical History:  Diagnosis Date  . Allergic rhinitis   . Anxiety   . Arthritis    hands, knees  . Breast cancer ([place]) 2019   Right Breast Cancer  . Cancer ([place]) 09/2018   Breast CA new DX, right breast  . Cervical dysplasia   . Common bile duct dilation   . Endometriosis   . Esophageal  reflux   . Family history of adverse reaction to anesthesia    younger sister anxiety for a dew days after  . Family history of breast cancer   . Family history of breast cancer   . Family history of prostate cancer   . Hepatic cyst 08/2020   multiple  . Hepatic hemangioma 08/2020   intrahepatic hemangioma  . Hiatal hernia   . Hypothyroid   . Migraine with aura, without mention of intractable  migraine without mention of status migrainosus   . Osteopenia 08/2019   T score -2.2 FRAX 11% / 1.7%  . Personal history of radiation therapy 2019   Right Breast Cancer  . Pre-diabetes     Past Surgical History:  Procedure Laterality Date  . BREAST EXCISIONAL BIOPSY Bilateral over 10 years ago   benign x 2   . BREAST LUMPECTOMY Right 10/10/2018  . BREAST LUMPECTOMY WITH RADIOACTIVE SEED AND SENTINEL LYMPH NODE BIOPSY Right 10/10/2018   Procedure: RIGHT BREAST LUMPECTOMY WITH RADIOACTIVE SEED AND RIGHT SENTINEL LYMPH NODE BIOPSY;  Surgeon: Jovita Kussmaul, MD;  Location: [facility];  Service: General;  Laterality: Right;  . BREAST SURGERY     Benign breast lump excised  . CERVICAL CONE BIOPSY  1985   severe dysplasia  . COLONOSCOPY  2005 and dec 2019   normal  . COLPOSCOPY    . endoscopy  10/2018  . LAPAROSCOPIC ASSISTED VAGINAL HYSTERECTOMY    . LAPAROSCOPIC BILATERAL SALPINGO OOPHERECTOMY Bilateral 10/27/2019   Procedure: LAPAROSCOPIC BILATERAL SALPINGO OOPHORECTOMY WITH PERITONEAL WASHINGS;  Surgeon: Princess Bruins, MD;  Location: [facility];  Service: Gynecology;  Laterality: Bilateral;  request 1:00pm on Friday, Dec. 4th in [name] time held requests one hour OR time  . moles removed from upper back, face lft, 1 between breasts, upper leg left inside  10/18/2019   wearing small round bandaids on for 2 weeks  . ROTATOR CUFF REPAIR Bilateral 08/2008    Current Outpatient Medications  Medication Sig Dispense Refill  . ALPRAZolam (XANAX) 0.5 MG tablet TAKE 1/2 TO 1 TABLET BY MOUTH ONCE DAILY AS NEEDED FOR SLEEP/ANXIETY 15 tablet 0  . atenolol (TENORMIN) 25 MG tablet Take 12.5 mg by mouth 3 (three) times daily. For migraine prevention    . Calcium Carbonate-Vitamin D 600-400 MG-UNIT tablet Take 2 tablets by mouth daily. Takes 2 in am 1 in pm    . clobetasol ointment (TEMOVATE) 8.31 % Apply 1 application topically 2 (two) times daily.  Avoid applying to face, groin, and axilla. Use as directed. 30 g 0  . famotidine (PEPCID) 40 MG tablet Take 40 mg by mouth 2 (two) times daily.     Marland Kitchen glucose blood (TRUE METRIX BLOOD GLUCOSE TEST) test strip Use to check blood sugar two times a day. (dx. R73.03) 100 each 0  . hydrocortisone 2.5 % cream Apply topically 2 (two) times daily as needed (Rash). 30 g 1  . hydrOXYzine (ATARAX/VISTARIL) 25 MG tablet Take 1 tablet (25 mg total) by mouth 3 (three) times daily as needed. (Patient taking differently: Take 25 mg by mouth 2 (two) times daily. ) 30 tablet 0  . Lancets (ACCU-CHEK MULTICLIX) lancets Use to check blood sugar two times a day. (dx.  R73.03) 102 each 0  . letrozole (FEMARA) 2.5 MG tablet Take 2.5 mg by mouth daily.    Marland Kitchen levocetirizine (XYZAL) 5 MG tablet Take 1 tablet by mouth every evening.  3  . levothyroxine (SYNTHROID)  75 MCG tablet TAKE ONE TABLET BY MOUTH ONE TIME DAILY  before breakfast 30 tablet 5  . Magnesium Oxide 400 (240 Mg) MG TABS Take by mouth.    . Misc Natural Products (GLUCOSAMINE CHOND COMPLEX/MSM) TABS Take 1 tablet by mouth daily.     . Multiple Vitamin (MULTIVITAMIN WITH MINERALS) TABS tablet Take 1 tablet by mouth daily.    . nabumetone (RELAFEN) 500 MG tablet Take 500 mg by mouth daily as needed (migraines).     . Omega-3 Fatty Acids (FISH OIL) 1000 MG CAPS Take 1,000 mg by mouth daily.     . OnabotulinumtoxinA (BOTOX IJ) Inject 1 Dose as directed every 6 (six) weeks.     . ondansetron (ZOFRAN ODT) 4 MG disintegrating tablet Take 1 tablet (4 mg total) by mouth every 8 (eight) hours as needed. 15 tablet 0  . pantoprazole (PROTONIX) 40 MG tablet TAKE ONE TABLET BY MOUTH ONE TIME DAILY 90 tablet 0  . Probiotic Product (PROBIOTIC-10 PO) Take 1 capsule by mouth daily.     . promethazine (PHENERGAN) 25 MG tablet Take 12.5 mg by mouth daily as needed for nausea or vomiting. When having a migraine    . TURMERIC PO Take 1 tablet by mouth daily.     . cyanocobalamin 2000  MCG tablet Take 2,000 mcg by mouth. 3 x week fri, sat, sunday    . rizatriptan (MAXALT) 10 MG tablet Take 1 tablet (10 mg total) by mouth once as needed for up to 10 days for migraine. May repeat in 2 hours if needed 10 tablet 0   No current facility-administered medications for this visit.    Allergies as of 09/05/2020 - Review Complete 09/05/2020  Allergen Reaction Noted  . Ciprofloxacin Other (See Comments) 09/21/2018  . Codeine phosphate Other (See Comments) 02/07/2007  . Decongestant [pseudoephedrine] Other (See Comments) 09/21/2018  . Flonase [fluticasone propionate] Other (See Comments) 11/13/2014  . Hydrocodone Other (See Comments) 11/27/2008  . Reglan [metoclopramide] Other (See Comments) 09/23/2018  . Topamax [topiramate] Other (See Comments) 09/21/2018  . Benadryl [diphenhydramine hcl] Palpitations 10/18/2017  . Sulfa antibiotics Anxiety 09/05/2014    Family History  Problem Relation Age of Onset  . Breast cancer Mother 35  . Diabetes Mother   . Hypertension Father   . Heart disease Father   . Osteoporosis Maternal Grandmother   . Diabetes Maternal Grandmother   . Hypertension Sister   . COPD Paternal Grandmother   . Heart disease Paternal Grandfather   . Breast cancer Cousin 42       pat first cousin  . Breast cancer Cousin        mat second cousin with breast cancer in her 71s  . Prostate cancer Other 26  . Thyroid cancer Cousin 36       pat first cousin  . Colon cancer Neg Hx   . Stomach cancer Neg Hx   . Esophageal cancer Neg Hx   . Pancreatic cancer Neg Hx     Social History   Socioeconomic History  . Marital status: Widowed    Spouse name: Not on file  . Number of children: Not on file  . Years of education: Not on file  . Highest education level: Not on file  Occupational History  . 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 Smoking status: Never Smoker  . Smokeless tobacco: Never Used  Vaping Use  . Vaping Use: Never used  Substance and  Sexual Activity  . Alcohol use:  No    Alcohol/week: 0.0 standard drinks  . Drug use: No  . Sexual activity: Not Currently    Birth control/protection: Post-menopausal    Comment: 1st intercourse 68 yo-5 partners  Other Topics Concern  . 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   Social Determinants of Health   Financial Resource Strain:   . Difficulty of Paying Living Expenses: Not on file  Food Insecurity:   . Worried About Charity fundraiser in the Last Year: Not on file  . Ran Out of Food in the Last Year: Not on file  Transportation Needs:   . Lack of Transportation (Medical): Not on file  . Lack of Transportation (Non-Medical): Not on file  Physical Activity:   . Days of Exercise per Week: Not on file  . Minutes of Exercise per Session: Not on file  Stress:   . Feeling of Stress : Not on file  Social Connections:   . Frequency of Communication with Friends and Family: Not on file  . Frequency of Social Gatherings with Friends and Family: Not on file  . Attends Religious Services: Not on file  . Active Member of Clubs or Organizations: Not on file  . Attends Archivist Meetings: Not on file  . Marital Status: Not on file  Intimate Partner Violence:   . Fear of Current or Ex-Partner: Not on file  . Emotionally Abused: Not on file  . Physically Abused: Not on file  . Sexually Abused: Not on file    Physical Exam: General:   Alert,  well-nourished, pleasant and cooperative in NAD Head:  Normocephalic and atraumatic. Eyes:  Sclera clear, no icterus.   Conjunctiva pink. Ears:  Normal auditory acuity. Nose:  No deformity, discharge,  or lesions. Mouth:  No deformity or lesions.   Neck:  Supple; no masses or thyromegaly. Lungs:  Clear throughout to auscultation.   No wheezes. Heart:  Regular rate and rhythm; no murmurs. Abdomen:  Soft, nontender, nondistended, normal  bowel sounds, no rebound or guarding. No hepatosplenomegaly.  No umbilical or inguinal lymphadenopathy.  Rectal:  Deferred  Msk:  Symmetrical. No boney deformities LAD: No inguinal or umbilical LAD Extremities:  No clubbing or edema. Neurologic:  Alert and  oriented x4;  grossly nonfocal Skin:  Intact without significant lesions or rashes. Psych:  Alert and cooperative. Normal mood and affect.     Saaya Procell L. Tarri Glenn, MD, MPH 09/05/2020, 2:16 PM

## 2020-09-05 NOTE — Patient Instructions (Addendum)
Your provider has requested that you go to the basement level for lab work before leaving today. Press "B" on the elevator. The lab is located at the first door on the left as you exit the elevator. ______________________________________________________________  Jennifer [name] have been scheduled for an MRI/MCRP at [facility] on 10/04/20. Your appointment time is 8:00 am. Please arrive 15 minutes prior to your appointment time for registration purposes. Please make certain not to have anything to eat or drink 6 hours prior to your test. In addition, if you have any metal in your body, have a pacemaker or defibrillator, please be sure to let your ordering physician know. This test typically takes 45 minutes to 1 hour to complete. Should you need to reschedule, please call [phone] to do so. _______________________________________________________________  If you are age 16 or younger, your body mass index should be between 19-25. Your Body mass index is 25.32 kg/m. If this is out of the aformentioned range listed, please consider follow up with your Primary Care Provider.  ________________________________________________________________ Due to recent changes in healthcare laws, you may see the results of your imaging and laboratory studies on MyChart before your provider has had a chance to review them.  We understand that in some cases there may be results that are confusing or concerning to you. Not all laboratory results come back in the same time frame and the provider may be waiting for multiple results in order to interpret others.  Please give [place] 48 hours in order for your provider to thoroughly review all the results before contacting the office for clarification of your results.

## 2020-09-05 NOTE — Progress Notes (Addendum)
Referring Provider: Abner Greenspan, MD Primary Care Physician:  Tower, [name], MD  Chief complaint:  Slightly dilated bile duct   IMPRESSION:  Acute abdominal pain and back pain, now resolved Ongoing nausea Abnormal bile duct on CT and MRI/MRCP with normal liver enzymes Personal history of breast cancer Husband with bile duct cancer  Her LFTs were completely normal on evaluation in the ED with acute pain.  Acute abdominal and back pain has resolved. Nausea persists. I have personally reviewed the imagines obtained at the time of ER visit. CT and MRI/MRCP show mild intrahepatic and common bile duct dilatation to the level of the distal common bile duct/ampulla where there is a focal area of narrowing.   She is extremely concerned about the bile duct abnormality given the concern for cancer.    Will review candidacy for EUS with one of our biliary endoscopists. Repeat LFTs today.      PLAN: - Hepatic function test today - Will review benefits of outpatient EUS with one of our biliary endoscopists  Addendum: Will add CEA and Ca-19-9. Discussed with Dr. Ardis Hughs who will arrange for EUS.   Please see the "Patient Instructions" section for addition details about the plan.  HPI: Jennifer [name] is a 64 y.o. 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 She was evaluated in August 2021 by Ellouise Newer for reflux. Previously followed by the [facility] who performed an EGD 11/09/17 showing mild reflux, H pylori negative gastritis, and fundic gland polyps. Treated with pantoprazole 40 mg daily and famotidine 20 gm BID. She had a colonoscopy at the same time showing diverticulosis and hemorrhoids.  Surveillance colonoscopy recommended in 2023.   Recently evaluated in the ED 08/27/20 for nausea and upper abdominal discomfort after eating pizza. She called our office requesting urgent evaluation for ongoing symptoms. 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 Worked at [employer] for 20 years. 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    ED evaluation 08/27/20 included normal liver enzymes and normal lipase. Ultrasound showed a mildly dilated CBD without stones. The gallbladder appeared normal. CT abd/pelvis showed moderate amount of stool and mild common bile duct dilatation to the level of the ampulla. An MRI/MRCP showed  mild intrahepatic and common bile duct dilatation to the level of the distal common bile duct/ampulla where there is a focal area of narrowing and multiple intrahepatic cysts. There was also a 29mm hemangioma in the right liver lobe.  The radiologist suggested that this could be due to a focal area of stricturing. No choledocholithiasis is seen.  Given her history of breast cancer she is extremely concerned about. Her husband died of bile duct cancer 7 years ago and this has her even more concerned.   Intermittent nausea since the time of her ER evaluation. Abdominal pain and back pain have resolved.  Feels feel like food is digesting. Following a mediterranean diet. No identified food triggers. Using probiotics daily. Weight stable. Appetite is good.  No alcohol. Never smoker. No family history of pancreatic or biliary malignancy.   Requires PPI for reflux. Needs her H2Blocker for allergies.     Past Medical History:  Diagnosis Date  . Allergic rhinitis   . Anxiety   . Arthritis    hands, knees  . Breast cancer ([place]) 2019   Right Breast Cancer  . Cancer ([place]) 09/2018   Breast CA new DX, right breast  . Cervical dysplasia   . Common bile duct dilation   . Endometriosis   . Esophageal  reflux   . Family history of adverse reaction to anesthesia    younger sister anxiety for a dew days after  . Family history of breast cancer   . Family history of breast cancer   . Family history of prostate cancer   . Hepatic cyst 08/2020   multiple  . Hepatic hemangioma 08/2020   intrahepatic hemangioma  . Hiatal hernia   . Hypothyroid   . Migraine with aura, without mention of intractable  migraine without mention of status migrainosus   . Osteopenia 08/2019   T score -2.2 FRAX 11% / 1.7%  . Personal history of radiation therapy 2019   Right Breast Cancer  . Pre-diabetes     Past Surgical History:  Procedure Laterality Date  . BREAST EXCISIONAL BIOPSY Bilateral over 10 years ago   benign x 2   . BREAST LUMPECTOMY Right 10/10/2018  . BREAST LUMPECTOMY WITH RADIOACTIVE SEED AND SENTINEL LYMPH NODE BIOPSY Right 10/10/2018   Procedure: RIGHT BREAST LUMPECTOMY WITH RADIOACTIVE SEED AND RIGHT SENTINEL LYMPH NODE BIOPSY;  Surgeon: Jovita Kussmaul, MD;  Location: [facility];  Service: General;  Laterality: Right;  . BREAST SURGERY     Benign breast lump excised  . CERVICAL CONE BIOPSY  1985   severe dysplasia  . COLONOSCOPY  2005 and dec 2019   normal  . COLPOSCOPY    . endoscopy  10/2018  . LAPAROSCOPIC ASSISTED VAGINAL HYSTERECTOMY    . LAPAROSCOPIC BILATERAL SALPINGO OOPHERECTOMY Bilateral 10/27/2019   Procedure: LAPAROSCOPIC BILATERAL SALPINGO OOPHORECTOMY WITH PERITONEAL WASHINGS;  Surgeon: Princess Bruins, MD;  Location: [place];  Service: Gynecology;  Laterality: Bilateral;  request 1:00pm on Friday, Dec. 4th in [place] time held requests one hour OR time  . moles removed from upper back, face lft, 1 between breasts, upper leg left inside  10/18/2019   wearing small round bandaids on for 2 weeks  . ROTATOR CUFF REPAIR Bilateral 08/2008    Current Outpatient Medications  Medication Sig Dispense Refill  . ALPRAZolam (XANAX) 0.5 MG tablet TAKE 1/2 TO 1 TABLET BY MOUTH ONCE DAILY AS NEEDED FOR SLEEP/ANXIETY 15 tablet 0  . atenolol (TENORMIN) 25 MG tablet Take 12.5 mg by mouth 3 (three) times daily. For migraine prevention    . Calcium Carbonate-Vitamin D 600-400 MG-UNIT tablet Take 2 tablets by mouth daily. Takes 2 in am 1 in pm    . clobetasol ointment (TEMOVATE) 4.09 % Apply 1 application topically 2 (two) times daily.  Avoid applying to face, groin, and axilla. Use as directed. 30 g 0  . famotidine (PEPCID) 40 MG tablet Take 40 mg by mouth 2 (two) times daily.     Marland Kitchen glucose blood (TRUE METRIX BLOOD GLUCOSE TEST) test strip Use to check blood sugar two times a day. (dx. R73.03) 100 each 0  . hydrocortisone 2.5 % cream Apply topically 2 (two) times daily as needed (Rash). 30 g 1  . hydrOXYzine (ATARAX/VISTARIL) 25 MG tablet Take 1 tablet (25 mg total) by mouth 3 (three) times daily as needed. (Patient taking differently: Take 25 mg by mouth 2 (two) times daily. ) 30 tablet 0  . Lancets (ACCU-CHEK MULTICLIX) lancets Use to check blood sugar two times a day. (dx.  R73.03) 102 each 0  . letrozole (FEMARA) 2.5 MG tablet Take 2.5 mg by mouth daily.    Marland Kitchen levocetirizine (XYZAL) 5 MG tablet Take 1 tablet by mouth every evening.  3  . levothyroxine (SYNTHROID)  75 MCG tablet TAKE ONE TABLET BY MOUTH ONE TIME DAILY  before breakfast 30 tablet 5  . Magnesium Oxide 400 (240 Mg) MG TABS Take by mouth.    . Misc Natural Products (GLUCOSAMINE CHOND COMPLEX/MSM) TABS Take 1 tablet by mouth daily.     . Multiple Vitamin (MULTIVITAMIN WITH MINERALS) TABS tablet Take 1 tablet by mouth daily.    . nabumetone (RELAFEN) 500 MG tablet Take 500 mg by mouth daily as needed (migraines).     . Omega-3 Fatty Acids (FISH OIL) 1000 MG CAPS Take 1,000 mg by mouth daily.     . OnabotulinumtoxinA (BOTOX IJ) Inject 1 Dose as directed every 6 (six) weeks.     . ondansetron (ZOFRAN ODT) 4 MG disintegrating tablet Take 1 tablet (4 mg total) by mouth every 8 (eight) hours as needed. 15 tablet 0  . pantoprazole (PROTONIX) 40 MG tablet TAKE ONE TABLET BY MOUTH ONE TIME DAILY 90 tablet 0  . Probiotic Product (PROBIOTIC-10 PO) Take 1 capsule by mouth daily.     . promethazine (PHENERGAN) 25 MG tablet Take 12.5 mg by mouth daily as needed for nausea or vomiting. When having a migraine    . TURMERIC PO Take 1 tablet by mouth daily.     . cyanocobalamin 2000  MCG tablet Take 2,000 mcg by mouth. 3 x week fri, sat, sunday    . rizatriptan (MAXALT) 10 MG tablet Take 1 tablet (10 mg total) by mouth once as needed for up to 10 days for migraine. May repeat in 2 hours if needed 10 tablet 0   No current facility-administered medications for this visit.    Allergies as of 09/05/2020 - Review Complete 09/05/2020  Allergen Reaction Noted  . Ciprofloxacin Other (See Comments) 09/21/2018  . Codeine phosphate Other (See Comments) 02/07/2007  . Decongestant [pseudoephedrine] Other (See Comments) 09/21/2018  . Flonase [fluticasone propionate] Other (See Comments) 11/13/2014  . Hydrocodone Other (See Comments) 11/27/2008  . Reglan [metoclopramide] Other (See Comments) 09/23/2018  . Topamax [topiramate] Other (See Comments) 09/21/2018  . Benadryl [diphenhydramine hcl] Palpitations 10/18/2017  . Sulfa antibiotics Anxiety 09/05/2014    Family History  Problem Relation Age of Onset  . Breast cancer Mother 44  . Diabetes Mother   . Hypertension Father   . Heart disease Father   . Osteoporosis Maternal Grandmother   . Diabetes Maternal Grandmother   . Hypertension Sister   . COPD Paternal Grandmother   . Heart disease Paternal Grandfather   . Breast cancer Cousin 53       pat first cousin  . Breast cancer Cousin        mat second cousin with breast cancer in her 77s  . Prostate cancer Other 75  . Thyroid cancer Cousin 52       pat first cousin  . Colon cancer Neg Hx   . Stomach cancer Neg Hx   . Esophageal cancer Neg Hx   . Pancreatic cancer Neg Hx     Social History   Socioeconomic History  . Marital status: Widowed    Spouse name: Not on file  . Number of children: Not on file  . Years of education: Not on file  . Highest education level: Not on file  Occupational History  . 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 Smoking status: Never Smoker  . Smokeless tobacco: Never Used  Vaping Use  . Vaping Use: Never used  Substance and  Sexual Activity  . Alcohol use:  No    Alcohol/week: 0.0 standard drinks  . Drug use: No  . Sexual activity: Not Currently    Birth control/protection: Post-menopausal    Comment: 1st intercourse 50 yo-5 partners  Other Topics Concern  . 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   Social Determinants of Health   Financial Resource Strain:   . Difficulty of Paying Living Expenses: Not on file  Food Insecurity:   . Worried About Charity fundraiser in the Last Year: Not on file  . Ran Out of Food in the Last Year: Not on file  Transportation Needs:   . Lack of Transportation (Medical): Not on file  . Lack of Transportation (Non-Medical): Not on file  Physical Activity:   . Days of Exercise per Week: Not on file  . Minutes of Exercise per Session: Not on file  Stress:   . Feeling of Stress : Not on file  Social Connections:   . Frequency of Communication with Friends and Family: Not on file  . Frequency of Social Gatherings with Friends and Family: Not on file  . Attends Religious Services: Not on file  . Active Member of Clubs or Organizations: Not on file  . Attends Archivist Meetings: Not on file  . Marital Status: Not on file  Intimate Partner Violence:   . Fear of Current or Ex-Partner: Not on file  . Emotionally Abused: Not on file  . Physically Abused: Not on file  . Sexually Abused: Not on file    Physical Exam: General:   Alert,  well-nourished, pleasant and cooperative in NAD Head:  Normocephalic and atraumatic. Eyes:  Sclera clear, no icterus.   Conjunctiva pink. Ears:  Normal auditory acuity. Nose:  No deformity, discharge,  or lesions. Mouth:  No deformity or lesions.   Neck:  Supple; no masses or thyromegaly. Lungs:  Clear throughout to auscultation.   No wheezes. Heart:  Regular rate and rhythm; no murmurs. Abdomen:  Soft, nontender, nondistended, normal  bowel sounds, no rebound or guarding. No hepatosplenomegaly.  No umbilical or inguinal lymphadenopathy.  Rectal:  Deferred  Msk:  Symmetrical. No boney deformities LAD: No inguinal or umbilical LAD Extremities:  No clubbing or edema. Neurologic:  Alert and  oriented x4;  grossly nonfocal Skin:  Intact without significant lesions or rashes. Psych:  Alert and cooperative. Normal mood and affect.     Venida Tsukamoto L. Tarri Glenn, MD, MPH 09/05/2020, 2:16 PM

## 2020-09-06 ENCOUNTER — Ambulatory Visit (INDEPENDENT_AMBULATORY_CARE_PROVIDER_SITE_OTHER): Payer: BC Managed Care – PPO | Admitting: Family Medicine

## 2020-09-06 ENCOUNTER — Other Ambulatory Visit: Payer: Self-pay

## 2020-09-06 ENCOUNTER — Encounter: Payer: Self-pay | Admitting: Family Medicine

## 2020-09-06 VITALS — BP 116/70 | HR 66 | Temp 97.3°F | Ht 60.0 in | Wt 133.1 lb

## 2020-09-06 DIAGNOSIS — R112 Nausea with vomiting, unspecified: Secondary | ICD-10-CM | POA: Insufficient documentation

## 2020-09-06 DIAGNOSIS — R918 Other nonspecific abnormal finding of lung field: Secondary | ICD-10-CM | POA: Diagnosis not present

## 2020-09-06 DIAGNOSIS — E039 Hypothyroidism, unspecified: Secondary | ICD-10-CM | POA: Diagnosis not present

## 2020-09-06 DIAGNOSIS — G4489 Other headache syndrome: Secondary | ICD-10-CM | POA: Diagnosis not present

## 2020-09-06 DIAGNOSIS — K838 Other specified diseases of biliary tract: Secondary | ICD-10-CM

## 2020-09-06 DIAGNOSIS — Z853 Personal history of malignant neoplasm of breast: Secondary | ICD-10-CM | POA: Insufficient documentation

## 2020-09-06 DIAGNOSIS — Z79811 Long term (current) use of aromatase inhibitors: Secondary | ICD-10-CM | POA: Diagnosis not present

## 2020-09-06 DIAGNOSIS — G43901 Migraine, unspecified, not intractable, with status migrainosus: Secondary | ICD-10-CM | POA: Insufficient documentation

## 2020-09-06 DIAGNOSIS — Z79899 Other long term (current) drug therapy: Secondary | ICD-10-CM | POA: Diagnosis not present

## 2020-09-06 DIAGNOSIS — R519 Headache, unspecified: Secondary | ICD-10-CM | POA: Diagnosis not present

## 2020-09-06 DIAGNOSIS — D649 Anemia, unspecified: Secondary | ICD-10-CM | POA: Diagnosis not present

## 2020-09-06 DIAGNOSIS — R110 Nausea: Secondary | ICD-10-CM | POA: Diagnosis not present

## 2020-09-06 DIAGNOSIS — R52 Pain, unspecified: Secondary | ICD-10-CM | POA: Diagnosis not present

## 2020-09-06 DIAGNOSIS — G43911 Migraine, unspecified, intractable, with status migrainosus: Secondary | ICD-10-CM | POA: Diagnosis not present

## 2020-09-06 MED ORDER — PREDNISONE 20 MG PO TABS
60.0000 mg | ORAL_TABLET | Freq: Once | ORAL | Status: AC
  Administered 2020-09-06: 60 mg via ORAL
  Filled 2020-09-06: qty 3

## 2020-09-06 MED ORDER — PROCHLORPERAZINE EDISYLATE 10 MG/2ML IJ SOLN
10.0000 mg | Freq: Once | INTRAMUSCULAR | Status: DC
  Filled 2020-09-06: qty 2

## 2020-09-06 NOTE — ED Triage Notes (Signed)
First Nurse: patient brought in by ems from home. patient with complaint of migraine that started that about 05:30. Patient's migraine improved with medications at home but came back about 15:30 this afternoon. Patient's home medications did not help the migraine this afternoon. Patient was given 4 mg Zofran IV by ems.

## 2020-09-06 NOTE — Patient Instructions (Addendum)
Eat some iron rich foods when you can  Drink fluids  Eat a low fat diet until your bile duct is re evaluated  No greasy food /fries / burgers/pizza   Take care of yourself    We will work on a chest CT referral to re check that lung base   Please alert [place] if symptoms worsen or if you develop new symptoms

## 2020-09-06 NOTE — ED Notes (Signed)
Patient states that she feels like she is going to pass out. Patient sitting in a wheel chair. Vital signs remain stable. patient offered Compazine again at this time but patient refuses.

## 2020-09-06 NOTE — ED Notes (Signed)
Spoke with dr. Archie Balboa regarding pt's symptoms, order for compazine received, no ct orders or blood work ordered.

## 2020-09-06 NOTE — ED Triage Notes (Signed)
Pt states history of migraine. Pt with nausea. Pt states feels like typical migraine. Pt states she did receive her covid immunization yesterday. Pt with perrl, 52mm brisk.

## 2020-09-06 NOTE — Progress Notes (Signed)
Subjective:    Patient ID: Jennifer [name], female    DOB: 1956/02/23, 64 y.o.   MRN: [id number]  This visit occurred during the SARS-CoV-2 public health emergency.  Safety protocols were in place, including screening questions prior to the visit, additional usage of staff PPE, and extensive cleaning of exam room while observing appropriate contact time as indicated for disinfecting solutions.    HPI Pt presents for f/u of ER visit on 10/5 for abdominal and back pain (with bloating and nausea)  Symptoms started with pizza (which she does not generally eat)   Wt Readings from Last 3 Encounters:  09/06/20 133 lb 2 oz (60.4 kg)  09/05/20 134 lb (60.8 kg)  07/23/20 133 lb 6 oz (60.5 kg)   26.00 kg/m   Also had covid booster yesterday and feels poorly today/migaine   She saw GI Dr Tarri Glenn yesterday  May need another MRCP  MRI is scheduled 11/12     [place] and CT and MRI done   [place]: [place] ABDOMEN LIMITED RUQ  Result Date: 08/28/2020 CLINICAL DATA:  Right upper quadrant pain EXAM: ULTRASOUND ABDOMEN LIMITED RIGHT UPPER QUADRANT COMPARISON:  None. FINDINGS: Gallbladder: No gallstones or wall thickening visualized. No sonographic Murphy sign noted by sonographer. Common bile duct: Diameter: 7.1 mm proximally and distally 12 mm. No visualized stones. Liver: There is a complex cyst seen within the right liver lobe measuring 1.9 x 1.2 x 1.5 cm within internal septation. Foot within normal limits in parenchymal echogenicity. Portal vein is patent on color Doppler imaging with normal direction of blood flow towards the liver. Other: None. IMPRESSION: Mildly dilated common bile duct without shadowing stones. Normal appearing gallbladder. Electronically Signed   By: Prudencio Pair M.D.   On: 08/28/2020 00:41   MR abd/ MRCP w w/o contrast  IMPRESSION: Mild intrahepatic and common bile duct dilatation to the level of the distal common bile duct/ampulla where there is a focal area of narrowing. This could be  due to a focal area of stricturing. No choledocholithiasis is seen. Multiple intrahepatic cyst 7 mm intrahepatic hemangioma in the posterior right liver lobe. Electronically Signed   By: Prudencio Pair M.D.   On: 08/28/2020 03:  CT scan  IMPRESSION: Mild common bile duct dilatation to the level of the ampulla. No shadowing stones however are noted. This is nonspecific and if further evaluation is required would recommend nonemergent MRCP/MRI Small focus of patchy airspace opacity at the anterior right lung base which could be due to atelectasis and/or early infectious etiology. Moderate to large amount of colonic stool without evidence of obstruction. Electronically Signed   By: Prudencio Pair M.D.   On: 08/28/2020 01:30   No cough or sob  Feels fine  Will need imaging of chest    Liver enzymes nl   Lab Results  Component Value Date   ALT 19 09/05/2020   AST 18 09/05/2020   ALKPHOS 133 (H) 09/05/2020   BILITOT 0.4 09/05/2020   Lab Results  Component Value Date   CREATININE 0.69 08/27/2020   BUN 16 08/27/2020   NA 134 (L) 08/27/2020   K 3.5 08/27/2020   CL 98 08/27/2020   CO2 26 08/27/2020   Lab Results  Component Value Date   WBC 6.0 08/27/2020   HGB 10.8 (L) 08/27/2020   HCT 34.0 (L) 08/27/2020   MCV 80.4 08/27/2020   PLT 295 08/27/2020   Anemia-? If dillutional  GI is aware   Patient Active Problem List  Diagnosis Date Noted  . Common bile duct dilatation 09/06/2020  . Opacity of lung on imaging study 09/06/2020  . Weakness of right foot 07/10/2020  . Dizziness 07/10/2020  . History of breast cancer 03/01/2020  . Genetic testing 11/14/2019  . Bilateral hand pain 11/03/2019  . Family history of prostate cancer   . Adnexal cyst 09/26/2019  . Cystitis with hematuria 09/26/2019  . Pain of left calf 03/21/2019  . Use of anastrozole (Arimidex) 03/21/2019  . Elevated transaminase level 01/03/2019  . Family history of breast cancer   . Stress reaction 09/26/2018  .  Malignant neoplasm of upper-inner quadrant of right breast in female, estrogen receptor positive ([place]) 09/20/2018  . B12 deficiency 04/13/2018  . Joint pain 04/11/2018  . Paresthesia 04/11/2018  . Colon cancer screening 09/16/2017  . Prediabetes 07/28/2016  . Allergic rhinitis 11/13/2014  . Screening for lipoid disorders 09/26/2014  . Preventative health care 09/26/2014  . Routine general medical examination at a health care facility 10/28/2012  . Herpes labialis 04/28/2011  . GERD 09/16/2010  . Hypothyroidism 05/07/2008  . Osteopenia 05/07/2008  . Migraine with aura 11/11/2007   Past Medical History:  Diagnosis Date  . Allergic rhinitis   . Anxiety   . Arthritis    hands, knees  . Breast cancer ([place]) 2019   Right Breast Cancer  . Cancer ([place]) 09/2018   Breast CA new DX, right breast  . Cervical dysplasia   . Common bile duct dilation   . Endometriosis   . Esophageal reflux   . Family history of adverse reaction to anesthesia    younger sister anxiety for a dew days after  . Family history of breast cancer   . Family history of breast cancer   . Family history of prostate cancer   . Hepatic cyst 08/2020   multiple  . Hepatic hemangioma 08/2020   intrahepatic hemangioma  . Hiatal hernia   . Hypothyroid   . Migraine with aura, without mention of intractable migraine without mention of status migrainosus   . Osteopenia 08/2019   T score -2.2 FRAX 11% / 1.7%  . Personal history of radiation therapy 2019   Right Breast Cancer  . Pre-diabetes    Past Surgical History:  Procedure Laterality Date  . BREAST EXCISIONAL BIOPSY Bilateral over 10 years ago   benign x 2   . BREAST LUMPECTOMY Right 10/10/2018  . BREAST LUMPECTOMY WITH RADIOACTIVE SEED AND SENTINEL LYMPH NODE BIOPSY Right 10/10/2018   Procedure: RIGHT BREAST LUMPECTOMY WITH RADIOACTIVE SEED AND RIGHT SENTINEL LYMPH NODE BIOPSY;  Surgeon: Jovita Kussmaul, MD;  Location: [facility];  Service:  General;  Laterality: Right;  . BREAST SURGERY     Benign breast lump excised  . CERVICAL CONE BIOPSY  1985   severe dysplasia  . COLONOSCOPY  2005 and dec 2019   normal  . COLPOSCOPY    . endoscopy  10/2018  . LAPAROSCOPIC ASSISTED VAGINAL HYSTERECTOMY    . LAPAROSCOPIC BILATERAL SALPINGO OOPHERECTOMY Bilateral 10/27/2019   Procedure: LAPAROSCOPIC BILATERAL SALPINGO OOPHORECTOMY WITH PERITONEAL WASHINGS;  Surgeon: Princess Bruins, MD;  Location: [facility];  Service: Gynecology;  Laterality: Bilateral;  request 1:00pm on Friday, Dec. 4th in [place] time held requests one hour OR time  . moles removed from upper back, face lft, 1 between breasts, upper leg left inside  10/18/2019   wearing small round bandaids on for 2 weeks  . ROTATOR CUFF REPAIR Bilateral 08/2008  Social History   Tobacco Use  . Smoking status: Never Smoker  . Smokeless tobacco: Never Used  Vaping Use  . Vaping Use: Never used  Substance Use Topics  . Alcohol use: No    Alcohol/week: 0.0 standard drinks  . Drug use: No   Family History  Problem Relation Age of Onset  . Breast cancer Mother 32  . Diabetes Mother   . Hypertension Father   . Heart disease Father   . Osteoporosis Maternal Grandmother   . Diabetes Maternal Grandmother   . Hypertension Sister   . COPD Paternal Grandmother   . Heart disease Paternal Grandfather   . Breast cancer Cousin 37       pat first cousin  . Breast cancer Cousin        mat second cousin with breast cancer in her 15s  . Prostate cancer Other 51  . Thyroid cancer Cousin 68       pat first cousin  . Colon cancer Neg Hx   . Stomach cancer Neg Hx   . Esophageal cancer Neg Hx   . Pancreatic cancer Neg Hx    Allergies  Allergen Reactions  . Ciprofloxacin Other (See Comments)    Dizziness   . Codeine Phosphate Other (See Comments)    Dizziness, heart palpitations, nervous  . Decongestant [Pseudoephedrine] Other (See Comments)     dizziness  . Flonase [Fluticasone Propionate] Other (See Comments)    Worsens her migraine   . Hydrocodone Other (See Comments)    Made nervous--10/09 surgery rotator cuff  . Reglan [Metoclopramide] Other (See Comments)    Dizziness  . Topamax [Topiramate] Other (See Comments)    Dizziness   . Benadryl [Diphenhydramine Hcl] Palpitations  . Sulfa Antibiotics Anxiety   Current Outpatient Medications on File Prior to Visit  Medication Sig Dispense Refill  . ALPRAZolam (XANAX) 0.5 MG tablet TAKE 1/2 TO 1 TABLET BY MOUTH ONCE DAILY AS NEEDED FOR SLEEP/ANXIETY 15 tablet 0  . atenolol (TENORMIN) 25 MG tablet Take 12.5 mg by mouth 3 (three) times daily. For migraine prevention    . Calcium Carbonate-Vitamin D 600-400 MG-UNIT tablet Take 2 tablets by mouth daily. Takes 2 in am 1 in pm    . clobetasol ointment (TEMOVATE) 3.81 % Apply 1 application topically 2 (two) times daily. Avoid applying to face, groin, and axilla. Use as directed. 30 g 0  . famotidine (PEPCID) 40 MG tablet Take 40 mg by mouth 2 (two) times daily.     Marland Kitchen glucose blood (TRUE METRIX BLOOD GLUCOSE TEST) test strip Use to check blood sugar two times a day. (dx. R73.03) 100 each 0  . hydrocortisone 2.5 % cream Apply topically 2 (two) times daily as needed (Rash). 30 g 1  . hydrOXYzine (ATARAX/VISTARIL) 25 MG tablet Take 1 tablet (25 mg total) by mouth 3 (three) times daily as needed. (Patient taking differently: Take 25 mg by mouth 2 (two) times daily. ) 30 tablet 0  . Lancets (ACCU-CHEK MULTICLIX) lancets Use to check blood sugar two times a day. (dx.  R73.03) 102 each 0  . letrozole (FEMARA) 2.5 MG tablet Take 2.5 mg by mouth daily.    Marland Kitchen levocetirizine (XYZAL) 5 MG tablet Take 1 tablet by mouth every evening.  3  . levothyroxine (SYNTHROID) 75 MCG tablet TAKE ONE TABLET BY MOUTH ONE TIME DAILY  before breakfast 30 tablet 5  . Magnesium Oxide 400 (240 Mg) MG TABS Take by mouth.    . Misc  Natural Products (GLUCOSAMINE CHOND  COMPLEX/MSM) TABS Take 1 tablet by mouth daily.     . Multiple Vitamin (MULTIVITAMIN WITH MINERALS) TABS tablet Take 1 tablet by mouth daily.    . nabumetone (RELAFEN) 500 MG tablet Take 500 mg by mouth daily as needed (migraines).     . Omega-3 Fatty Acids (FISH OIL) 1000 MG CAPS Take 1,000 mg by mouth daily.     . OnabotulinumtoxinA (BOTOX IJ) Inject 1 Dose as directed every 6 (six) weeks.     . pantoprazole (PROTONIX) 40 MG tablet TAKE ONE TABLET BY MOUTH ONE TIME DAILY 90 tablet 0  . Probiotic Product (PROBIOTIC-10 PO) Take 1 capsule by mouth daily.     . promethazine (PHENERGAN) 25 MG tablet Take 12.5 mg by mouth daily as needed for nausea or vomiting. When having a migraine    . TURMERIC PO Take 1 tablet by mouth daily.     . cyanocobalamin 2000 MCG tablet Take 2,000 mcg by mouth. 3 x week fri, sat, Sunday (PRN as advised by provider) (Patient not taking: Reported on 09/06/2020)    . rizatriptan (MAXALT) 10 MG tablet Take 1 tablet (10 mg total) by mouth once as needed for up to 10 days for migraine. May repeat in 2 hours if needed 10 tablet 0   No current facility-administered medications on file prior to visit.    Review of Systems  Constitutional: Negative for activity change, appetite change, fatigue, fever and unexpected weight change.  HENT: Negative for congestion, ear pain, rhinorrhea, sinus pressure and sore throat.   Eyes: Negative for pain, redness and visual disturbance.  Respiratory: Negative for cough, shortness of breath and wheezing.   Cardiovascular: Negative for chest pain and palpitations.  Gastrointestinal: Negative for abdominal pain, blood in stool, constipation and diarrhea.  Endocrine: Negative for polydipsia and polyuria.  Genitourinary: Negative for dysuria, frequency and urgency.  Musculoskeletal: Negative for arthralgias, back pain and myalgias.  Skin: Negative for pallor and rash.  Allergic/Immunologic: Negative for environmental allergies.  Neurological:  Positive for headaches. Negative for dizziness and syncope.       Pt has a migraine today   Hematological: Negative for adenopathy. Does not bruise/bleed easily.  Psychiatric/Behavioral: Negative for decreased concentration and dysphoric mood. The patient is not nervous/anxious.        Objective:   Physical Exam Constitutional:      General: She is not in acute distress.    Appearance: Normal appearance. She is well-developed and normal weight. She is not ill-appearing or diaphoretic.  HENT:     Head: Normocephalic and atraumatic.  Eyes:     General: No scleral icterus.    Conjunctiva/sclera: Conjunctivae normal.     Pupils: Pupils are equal, round, and reactive to light.  Cardiovascular:     Rate and Rhythm: Normal rate and regular rhythm.     Heart sounds: Normal heart sounds.  Pulmonary:     Effort: Pulmonary effort is normal. No respiratory distress.     Breath sounds: Normal breath sounds. No stridor. No wheezing, rhonchi or rales.  Abdominal:     General: Bowel sounds are normal. There is no distension.     Palpations: Abdomen is soft. There is no mass.     Tenderness: There is no abdominal tenderness. There is no right CVA tenderness, left CVA tenderness, guarding or rebound.     Hernia: No hernia is present.  Musculoskeletal:     Cervical back: Normal range of motion and neck supple.  Right lower leg: No edema.     Left lower leg: No edema.  Lymphadenopathy:     Cervical: No cervical adenopathy.  Skin:    General: Skin is warm and dry.     Coloration: Skin is not pale.     Findings: No erythema or rash.  Neurological:     Mental Status: She is alert.  Psychiatric:        Mood and Affect: Mood normal.           Assessment & Plan:   Problem List Items Addressed This Visit      Digestive   Common bile duct dilatation - Primary    Reviewed hospital records, lab results and studies in detail  Feeling better  MRCP done and pt had visit with GI   (? If  she passed a common bile duct stone)  Planning another MRI then MRCP if needed  Disc need to avoid fatty foods until then-she agrees  Will call if abd pain or nausea return        Other   Opacity of lung on imaging study    Opacity of R lung base without symptoms in pt with h/o breast cancer Suspect atelectasis  CT of chest ordered to clarify Pt will watch for sob or cough       Relevant Orders   CT Chest Wo Contrast   Anemia    Last Hb 10.8 in the ER  No known blood loss Per pt was iron def in past  mcv in nl range  Will f/u with GI

## 2020-09-06 NOTE — ED Notes (Addendum)
Pt declines offer for compazine. Pt states she "do not want to deviate from what i've had before". Pt states she would like "high dose" steroids for migraine. Pt informed MD has ordered compazine for her at this time and no steroids, per MD will need evaluated for additional medication. Pt states :"I can't wait, it's just going to get worse". Pt informed of wait time by michele, rn and pt informed if she changes her mind about compazine to let me know and I will administer medication. Pt informed that I would like to help her feel better while she waits. Pt requesing RN to "look back at all the stuff i've had done and give me that". Pt informed that this rn is not able to look at all previous MARs at this time.

## 2020-09-06 NOTE — ED Notes (Signed)
Secure chat message sent to dr. Archie Balboa with pt's request for steroids and to look back in medical record for previous medications given.

## 2020-09-07 ENCOUNTER — Emergency Department
Admission: EM | Admit: 2020-09-07 | Discharge: 2020-09-07 | Disposition: A | Discharge status: Home / Self Care | Payer: BC Managed Care – PPO | Attending: Emergency Medicine | Admitting: Emergency Medicine

## 2020-09-07 DIAGNOSIS — G43901 Migraine, unspecified, not intractable, with status migrainosus: Secondary | ICD-10-CM

## 2020-09-07 MED ORDER — ONDANSETRON HCL 4 MG/2ML IJ SOLN
4.0000 mg | Freq: Once | INTRAMUSCULAR | Status: AC
  Administered 2020-09-07: 4 mg via INTRAVENOUS
  Filled 2020-09-07: qty 2

## 2020-09-07 MED ORDER — PREDNISONE 10 MG (21) PO TBPK: ORAL_TABLET | ORAL | 0 refills | Status: DC

## 2020-09-07 MED ORDER — ONDANSETRON 4 MG PO TBDP: 4.0000 mg | ORAL_TABLET | Freq: Three times a day (TID) | ORAL | 0 refills | Status: DC | PRN

## 2020-09-07 MED ORDER — SODIUM CHLORIDE 0.9 % IV BOLUS
1000.0000 mL | Freq: Once | INTRAVENOUS | Status: AC
  Administered 2020-09-07: 1000 mL via INTRAVENOUS

## 2020-09-07 MED ORDER — KETOROLAC TROMETHAMINE 10 MG PO TABS: 10.0000 mg | ORAL_TABLET | Freq: Four times a day (QID) | ORAL | 0 refills | Status: DC | PRN

## 2020-09-07 MED ORDER — KETOROLAC TROMETHAMINE 30 MG/ML IJ SOLN
15.0000 mg | INTRAMUSCULAR | Status: AC
  Administered 2020-09-07: 15 mg via INTRAVENOUS
  Filled 2020-09-07: qty 1

## 2020-09-07 NOTE — ED Provider Notes (Signed)
[facility] Emergency Department Provider Note  ____________________________________________  Time seen: Approximately 4:56 AM  I have reviewed the triage vital signs and the nursing notes.   HISTORY  Chief Complaint Headache    HPI Jennifer [name] is a 64 y.o. female with a history of migraines, GERD who comes ED complaining of frontal headache, nausea, vomiting, photophobia, phonophobia, feels like a typical migraine headache for her, started yesterday about 3:30 PM.  She initially used her triptan which seem to resolve it, then took a nap but when she woke up the pain was much worse.  Symptoms currently constant, nonradiating, no alleviating factors.  She did get a third Covid vaccine 2 days ago.  Denies any chest pain shortness of breath or fever.      Past Medical History:  Diagnosis Date  . Allergic rhinitis   . Anxiety   . Arthritis    hands, knees  . Breast cancer ([place]) 2019   Right Breast Cancer  . Cancer ([place]) 09/2018   Breast CA new DX, right breast  . Cervical dysplasia   . Common bile duct dilation   . Endometriosis   . Esophageal reflux   . Family history of adverse reaction to anesthesia    younger sister anxiety for a dew days after  . Family history of breast cancer   . Family history of breast cancer   . Family history of prostate cancer   . Hepatic cyst 08/2020   multiple  . Hepatic hemangioma 08/2020   intrahepatic hemangioma  . Hiatal hernia   . Hypothyroid   . Migraine with aura, without mention of intractable migraine without mention of status migrainosus   . Osteopenia 08/2019   T score -2.2 FRAX 11% / 1.7%  . Personal history of radiation therapy 2019   Right Breast Cancer  . Pre-diabetes      Patient Active Problem List   Diagnosis Date Noted  . Common bile duct dilatation 09/06/2020  . Opacity of lung on imaging study 09/06/2020  . Weakness of right foot 07/10/2020  . Dizziness 07/10/2020  . History of breast  cancer 03/01/2020  . Genetic testing 11/14/2019  . Bilateral hand pain 11/03/2019  . Family history of prostate cancer   . Adnexal cyst 09/26/2019  . Cystitis with hematuria 09/26/2019  . Pain of left calf 03/21/2019  . Use of anastrozole (Arimidex) 03/21/2019  . Elevated transaminase level 01/03/2019  . Family history of breast cancer   . Stress reaction 09/26/2018  . Malignant neoplasm of upper-inner quadrant of right breast in female, estrogen receptor positive ([place]) 09/20/2018  . B12 deficiency 04/13/2018  . Joint pain 04/11/2018  . Paresthesia 04/11/2018  . Colon cancer screening 09/16/2017  . Prediabetes 07/28/2016  . Allergic rhinitis 11/13/2014  . Screening for lipoid disorders 09/26/2014  . Preventative health care 09/26/2014  . Routine general medical examination at a health care facility 10/28/2012  . Herpes labialis 04/28/2011  . GERD 09/16/2010  . Hypothyroidism 05/07/2008  . Osteopenia 05/07/2008  . Migraine with aura 11/11/2007     Past Surgical History:  Procedure Laterality Date  . BREAST EXCISIONAL BIOPSY Bilateral over 10 years ago   benign x 2   . BREAST LUMPECTOMY Right 10/10/2018  . BREAST LUMPECTOMY WITH RADIOACTIVE SEED AND SENTINEL LYMPH NODE BIOPSY Right 10/10/2018   Procedure: RIGHT BREAST LUMPECTOMY WITH RADIOACTIVE SEED AND RIGHT SENTINEL LYMPH NODE BIOPSY;  Surgeon: Jovita Kussmaul, MD;  Location: [place];  Service:  General;  Laterality: Right;  . BREAST SURGERY     Benign breast lump excised  . CERVICAL CONE BIOPSY  1985   severe dysplasia  . COLONOSCOPY  2005 and dec 2019   normal  . COLPOSCOPY    . endoscopy  10/2018  . LAPAROSCOPIC ASSISTED VAGINAL HYSTERECTOMY    . LAPAROSCOPIC BILATERAL SALPINGO OOPHERECTOMY Bilateral 10/27/2019   Procedure: LAPAROSCOPIC BILATERAL SALPINGO OOPHORECTOMY WITH PERITONEAL WASHINGS;  Surgeon: Princess Bruins, MD;  Location: [place];  Service: Gynecology;  Laterality:  Bilateral;  request 1:00pm on Friday, Dec. 4th in [place] time held requests one hour OR time  . moles removed from upper back, face lft, 1 between breasts, upper leg left inside  10/18/2019   wearing small round bandaids on for 2 weeks  . ROTATOR CUFF REPAIR Bilateral 08/2008     Prior to Admission medications   Medication Sig Start Date End Date Taking? Authorizing Provider  ALPRAZolam (XANAX) 0.5 MG tablet TAKE 1/2 TO 1 TABLET BY MOUTH ONCE DAILY AS NEEDED FOR SLEEP/ANXIETY 06/25/20   Tower, Marne A, MD  atenolol (TENORMIN) 25 MG tablet Take 12.5 mg by mouth 3 (three) times daily. For migraine prevention    [provider]  Calcium Carbonate-Vitamin D 600-400 MG-UNIT tablet Take 2 tablets by mouth daily. Takes 2 in am 1 in pm    [provider]  clobetasol ointment (TEMOVATE) 3.33 % Apply 1 application topically 2 (two) times daily. Avoid applying to face, groin, and axilla. Use as directed. 07/04/20   Moye, [place], MD  cyanocobalamin 2000 MCG tablet Take 2,000 mcg by mouth. 3 x week fri, sat, Sunday (PRN as advised by provider) Patient not taking: Reported on 09/06/2020    [provider]  famotidine (PEPCID) 40 MG tablet Take 40 mg by mouth 2 (two) times daily.     [provider]  glucose blood (TRUE METRIX BLOOD GLUCOSE TEST) test strip Use to check blood sugar two times a day. (dx. R73.03) 08/21/20   Tower, [name], MD  hydrocortisone 2.5 % cream Apply topically 2 (two) times daily as needed ([place]). 07/04/20   Moye, [place], MD  hydrOXYzine (ATARAX/VISTARIL) 25 MG tablet Take 1 tablet (25 mg total) by mouth 3 (three) times daily as needed. Patient taking differently: Take 25 mg by mouth 2 (two) times daily.  10/18/17   Triplett, Johnette Abraham B, FNP  Lancets (ACCU-CHEK MULTICLIX) lancets Use to check blood sugar two times a day. (dx.  R73.03) 08/21/20   Tower, [name], MD  letrozole [facility]) 2.5 MG tablet Take 2.5 mg by mouth daily.    [provider]  levocetirizine (XYZAL) 5 MG tablet Take 1 tablet by mouth every evening. 10/28/17   [provider]  levothyroxine (SYNTHROID) 75 MCG tablet TAKE ONE TABLET BY MOUTH ONE TIME DAILY  before breakfast 02/28/20   Tower, [name], MD  Magnesium Oxide 400 (240 Mg) MG TABS Take by mouth.    [provider]  Misc Natural Products (GLUCOSAMINE CHOND COMPLEX/MSM) TABS Take 1 tablet by mouth daily.     [provider]  Multiple Vitamin (MULTIVITAMIN WITH MINERALS) TABS tablet Take 1 tablet by mouth daily.    [provider]  nabumetone (RELAFEN) 500 MG tablet Take 500 mg by mouth daily as needed (migraines).     [provider]  Omega-3 Fatty Acids (FISH OIL) 1000 MG CAPS Take 1,000 mg by mouth daily.     [provider]  OnabotulinumtoxinA (BOTOX IJ) Inject 1 Dose as directed every 6 (six) weeks.     [provider]  ondansetron (ZOFRAN ODT) 4 MG disintegrating tablet Take 1 tablet (4 mg total) by mouth every 8 (eight) hours as needed for nausea or vomiting. 09/07/20   Carrie Mew, MD  pantoprazole (PROTONIX) 40 MG tablet TAKE ONE TABLET BY MOUTH ONE TIME DAILY 08/27/20   Tower, [name], MD  predniSONE (STERAPRED UNI-PAK 21 TAB) 10 MG (21) TBPK tablet 6 tablets on day 1, then 5 tablets on day 2, then 4 tablets on day 3, then 3 tablets on day 4, then 2 tablets on day 5, then 1 tablet on day 6. 09/07/20   Carrie Mew, MD  Probiotic Product (PROBIOTIC-10 PO) Take 1 capsule by mouth daily.     [provider]  promethazine (PHENERGAN) 25 MG tablet Take 12.5 mg by mouth daily as needed for nausea or vomiting. When having a migraine    [provider]  rizatriptan (MAXALT) 10 MG tablet Take 1 tablet (10 mg total) by mouth once as needed for up to 10 days for migraine. May repeat in 2 hours if needed 11/03/18 10/25/19  Menshew, Dannielle Karvonen, PA-C  TURMERIC PO Take 1 tablet by mouth daily.     [provider]     Allergies Ciprofloxacin, Codeine phosphate, Decongestant [pseudoephedrine], Flonase [fluticasone propionate], Hydrocodone, Reglan [metoclopramide], Topamax [topiramate], Benadryl [diphenhydramine hcl], and Sulfa antibiotics   Family History  Problem Relation Age of Onset  . Breast cancer Mother 86  . Diabetes Mother   . Hypertension Father   . Heart disease Father   . Osteoporosis Maternal Grandmother   . Diabetes Maternal Grandmother   . Hypertension Sister   . COPD Paternal Grandmother   . Heart disease Paternal Grandfather   . Breast cancer Cousin 3       pat first cousin  . Breast cancer Cousin        mat second cousin with breast cancer in her 34s  . Prostate cancer Other 92  . Thyroid cancer Cousin 84       pat first cousin  . Colon cancer Neg Hx   . Stomach cancer Neg Hx   . Esophageal cancer Neg Hx   . Pancreatic cancer Neg Hx     Social History Social History   Tobacco Use  . Smoking status: Never Smoker  . Smokeless tobacco: Never Used  Vaping Use  . Vaping Use: Never used  Substance Use Topics  . Alcohol use: No    Alcohol/week: 0.0 standard drinks  . Drug use: No    Review of Systems  Constitutional:   No fever or chills.  ENT:   No sore throat. No rhinorrhea. Cardiovascular:   No chest pain or syncope. Respiratory:   No dyspnea or cough. Gastrointestinal:   Negative for abdominal pain, vomiting and diarrhea.  Musculoskeletal:   Negative for focal pain or swelling All other systems reviewed and are negative except as documented above in ROS and HPI.  ____________________________________________   PHYSICAL EXAM:  VITAL SIGNS: ED Triage Vitals  Enc Vitals Group     BP 09/06/20 2058 129/77     Pulse Rate 09/06/20 2058 73     Resp 09/06/20 2058 16     Temp 09/06/20 2058 98 F (36.7 C)     Temp Source 09/06/20 2058 Oral     SpO2 09/06/20 2058 100 %     Weight  09/06/20 2059 130 lb (59 kg)     Height 09/06/20 2059 5'  (1.524 m)     Head Circumference --      Peak Flow --      Pain Score 09/06/20 2059 10     Pain Loc --      Pain Edu? --      Excl. in [place]? --     Vital signs reviewed, nursing assessments reviewed.   Constitutional:   Alert and oriented. Non-toxic appearance. Eyes:   Conjunctivae are normal. EOMI. positive photophobia ENT      Head:   Normocephalic and atraumatic.      Nose:   Wearing a mask.      Mouth/Throat:   Wearing a mask.      Neck:   No meningismus. Full ROM. Cardiovascular:   RRR.  Cap refill less than 2 seconds. Respiratory:   Normal respiratory effort without tachypnea/retractions.  Gastrointestinal:   Soft and nontender. Musculoskeletal:   Normal range of motion in all extremities. No joint effusions.  No lower extremity tenderness.  No edema. Neurologic:   Normal speech and language.  Motor grossly intact. No acute focal neurologic deficits are appreciated.    ____________________________________________    LABS (pertinent positives/negatives) (all labs ordered are listed, but only abnormal results are displayed) Labs Reviewed - No data to display ____________________________________________   ____________________________________________    RADIOLOGY  No results found.  ____________________________________________   PROCEDURES Procedures  ____________________________________________    CLINICAL IMPRESSION / ASSESSMENT AND PLAN / ED COURSE  Medications ordered in the ED: Medications  prochlorperazine (COMPAZINE) injection 10 mg (10 mg Intravenous Refused 09/06/20 2118)  predniSONE (DELTASONE) tablet 60 mg (60 mg Oral Given 09/06/20 2224)  sodium chloride 0.9 % bolus 1,000 mL (0 mLs Intravenous Stopped 09/07/20 0357)  ondansetron (ZOFRAN) injection 4 mg (4 mg Intravenous Given 09/07/20 0306)  ketorolac (TORADOL) 30 MG/ML injection 15 mg (15 mg Intravenous Given 09/07/20 0305)    Pertinent labs & imaging results that were available during my  care of the patient were reviewed by me and considered in my medical decision making (see chart for details).  Jennifer [name] was evaluated in Emergency Department on 09/07/2020 for the symptoms described in the history of present illness. She was evaluated in the context of the global COVID-19 pandemic, which necessitated consideration that the patient might be at risk for infection with the SARS-CoV-2 virus that causes COVID-19. Institutional protocols and algorithms that pertain to the evaluation of patients at risk for COVID-19 are in a state of rapid change based on information released by regulatory bodies including the CDC and federal and state organizations. These policies and algorithms were followed during the patient's care in the ED.   Patient presents with typical migraine headache.  She was given high-dose prednisone while waiting, and then given IV Toradol and fluids and Zofran in the treatment room.  On reassessment pain is 2/10 and she feels much better, ready for discharge home.  I will continue her on a prednisone taper, Zofran, and NSAIDs as needed.      ____________________________________________   FINAL CLINICAL IMPRESSION(S) / ED DIAGNOSES    Final diagnoses:  Migraine with status migrainosus, not intractable, unspecified migraine type     ED Discharge Orders         Ordered    predniSONE (STERAPRED UNI-PAK 21 TAB) 10 MG (21) TBPK tablet        09/07/20 0456    ondansetron (ZOFRAN  ODT) 4 MG disintegrating tablet  Every 8 hours PRN        09/07/20 0456          Portions of this note were generated with dragon dictation software. Dictation errors may occur despite best attempts at proofreading.   Carrie Mew, MD 09/07/20 0500

## 2020-09-07 NOTE — ED Notes (Signed)
Pt complains of migraine that began at 0530 Friday morning decreased but returned at 1530 Friday and has not gone away. Pt states this migraine is worse than previous. Sensitive to light and nauseated. Pt voiced relief after oral prednisone to a pain scale 8/10. Pain increased back to 10/10 after episode of vomiting. Pupils PERRLA at 85mm. Lights turned off and warm blanket provided to patient for comfort.

## 2020-09-08 DIAGNOSIS — D509 Iron deficiency anemia, unspecified: Secondary | ICD-10-CM | POA: Insufficient documentation

## 2020-09-08 DIAGNOSIS — D649 Anemia, unspecified: Secondary | ICD-10-CM | POA: Insufficient documentation

## 2020-09-08 NOTE — Assessment & Plan Note (Signed)
Last Hb 10.8 in the ER  No known blood loss Per pt was iron def in past  mcv in nl range  Will f/u with GI

## 2020-09-08 NOTE — Assessment & Plan Note (Signed)
Reviewed hospital records, lab results and studies in detail  Feeling better  MRCP done and pt had visit with GI   (? If she passed a common bile duct stone)  Planning another MRI then MRCP if needed  Disc need to avoid fatty foods until then-she agrees  Will call if abd pain or nausea return

## 2020-09-08 NOTE — Assessment & Plan Note (Signed)
Opacity of R lung base without symptoms in pt with h/o breast cancer Suspect atelectasis  CT of chest ordered to clarify Pt will watch for sob or cough

## 2020-09-09 ENCOUNTER — Telehealth: Payer: Self-pay

## 2020-09-09 NOTE — Telephone Encounter (Signed)
-----  Message from [name], MD sent at 09/09/2020  7:03 AM EDT ----- I'll get her in. Possibly this week if the spot hasn't been taken yet.  Thanks  Patty, She needs upper EUS for dilated bile duct, abd pains. Can you see if she can follow my 8:45 EGD case this week at [facility], my next case is at 10:45 and so there should be at least a 1 hour opening.  Thanks  ----- Message ----- From: Beavers, Kimberly, MD Sent: 09/08/2020   6:39 PM EDT To: [name], MD  I would like to have this patient have an EUS. Evaluated recently for acute abd pain/back pain with normal enzymes. But, both CT and MRI/MRCP show bile duct irregularity that may be a stricture. I'm worried about extrinsic compression.  Continues to have nausea. Recent repeat liver enzymes show slightly elevated alk phos. Pain resolved prior to leaving the ED. Patient has a personal history of breast cancer. Husband died of bile duct cancer and she is extremely concerned. May I schedule this with you?   She leaves for [place] to visit her mother for one month very soon.   Thanks!  KLB   

## 2020-09-09 NOTE — Telephone Encounter (Signed)
[name], MD  Timothy Lasso, RN Please go ahead with scheduling for this Thursday, I think I sent a staff note about her earlier today. Let me know if you see it.

## 2020-09-09 NOTE — Telephone Encounter (Signed)
Patient is calling to follow up on scheduling procedure also had questions about why the MR appt was canceled advise her that there was a call placed to her explaining to check her voicemail patient stated she will check and wait for the nurse to call

## 2020-09-09 NOTE — Telephone Encounter (Signed)
[place], MD  Timothy Lasso, RN Good morning. No success reaching the patient by phone. It rings directly to voicemail. I left a message and tried to explain it to the best of my ability. Please move forward with scheduling the procedure. I would also ask that she drop by the lab sometime in the next couple of days for a CEA and Ca-19-9.   Thanks.   KLB

## 2020-09-10 ENCOUNTER — Other Ambulatory Visit: Payer: Self-pay

## 2020-09-10 ENCOUNTER — Other Ambulatory Visit (HOSPITAL_COMMUNITY): Payer: BC Managed Care – PPO

## 2020-09-10 ENCOUNTER — Other Ambulatory Visit: Payer: BC Managed Care – PPO

## 2020-09-10 ENCOUNTER — Ambulatory Visit
Admission: RE | Admit: 2020-09-10 | Discharge: 2020-09-10 | Disposition: A | Discharge status: Home / Self Care | Payer: BC Managed Care – PPO | Source: Ambulatory Visit | Attending: Family Medicine | Admitting: Family Medicine

## 2020-09-10 DIAGNOSIS — J479 Bronchiectasis, uncomplicated: Secondary | ICD-10-CM | POA: Diagnosis not present

## 2020-09-10 DIAGNOSIS — R935 Abnormal findings on diagnostic imaging of other abdominal regions, including retroperitoneum: Secondary | ICD-10-CM

## 2020-09-10 DIAGNOSIS — K839 Disease of biliary tract, unspecified: Secondary | ICD-10-CM | POA: Diagnosis not present

## 2020-09-10 DIAGNOSIS — M47814 Spondylosis without myelopathy or radiculopathy, thoracic region: Secondary | ICD-10-CM | POA: Diagnosis not present

## 2020-09-10 DIAGNOSIS — R918 Other nonspecific abnormal finding of lung field: Secondary | ICD-10-CM

## 2020-09-10 NOTE — Telephone Encounter (Signed)
EUS scheduled, pt instructed and medications reviewed. Patient instructions mailed to home.  Patient to call with any questions or concerns. She will get labs today as well as COVD testing.

## 2020-09-10 NOTE — Progress Notes (Signed)
Attempted to obtain medical history via telephone, unable to reach at this time. I left a voicemail to return pre surgical testing department's phone call.  

## 2020-09-10 NOTE — Telephone Encounter (Signed)
appt scheduled for 10/21 at 930 am at [facility] with Dr Ardis Hughs.  She is to arrive at 8 am and NPO after midnight. She will need to have COVID test today at 1135 am.  Left message on machine to call back

## 2020-09-11 ENCOUNTER — Ambulatory Visit: Payer: BC Managed Care – PPO | Admitting: Internal Medicine

## 2020-09-11 ENCOUNTER — Other Ambulatory Visit: Payer: Self-pay | Admitting: Adult Health

## 2020-09-11 ENCOUNTER — Ambulatory Visit
Admission: RE | Admit: 2020-09-11 | Discharge: 2020-09-11 | Disposition: A | Discharge status: Home / Self Care | Payer: BC Managed Care – PPO | Source: Ambulatory Visit | Attending: Adult Health | Admitting: Adult Health

## 2020-09-11 ENCOUNTER — Other Ambulatory Visit: Payer: Self-pay

## 2020-09-11 ENCOUNTER — Other Ambulatory Visit (HOSPITAL_COMMUNITY)
Admission: RE | Admit: 2020-09-11 | Discharge: 2020-09-11 | Disposition: A | Discharge status: Home / Self Care | Payer: BC Managed Care – PPO | Source: Ambulatory Visit | Attending: Gastroenterology | Admitting: Gastroenterology

## 2020-09-11 DIAGNOSIS — R2232 Localized swelling, mass and lump, left upper limb: Secondary | ICD-10-CM

## 2020-09-11 DIAGNOSIS — G43719 Chronic migraine without aura, intractable, without status migrainosus: Secondary | ICD-10-CM | POA: Diagnosis not present

## 2020-09-11 DIAGNOSIS — Z20822 Contact with and (suspected) exposure to covid-19: Secondary | ICD-10-CM | POA: Insufficient documentation

## 2020-09-11 DIAGNOSIS — Z01812 Encounter for preprocedural laboratory examination: Secondary | ICD-10-CM | POA: Diagnosis not present

## 2020-09-11 DIAGNOSIS — G43019 Migraine without aura, intractable, without status migrainosus: Secondary | ICD-10-CM | POA: Diagnosis not present

## 2020-09-11 DIAGNOSIS — R922 Inconclusive mammogram: Secondary | ICD-10-CM | POA: Diagnosis not present

## 2020-09-11 DIAGNOSIS — Z9889 Other specified postprocedural states: Secondary | ICD-10-CM

## 2020-09-11 DIAGNOSIS — M542 Cervicalgia: Secondary | ICD-10-CM | POA: Diagnosis not present

## 2020-09-11 DIAGNOSIS — N6489 Other specified disorders of breast: Secondary | ICD-10-CM | POA: Diagnosis not present

## 2020-09-11 LAB — CANCER ANTIGEN 19-9: CA 19-9: 8 U/mL (ref <34)

## 2020-09-11 LAB — SARS CORONAVIRUS 2 (TAT 6-24 HRS): SARS Coronavirus 2: NEGATIVE

## 2020-09-11 LAB — CEA: CEA: 0.6 ng/mL

## 2020-09-11 NOTE — Telephone Encounter (Signed)
[place] Night - Client Nonclinical Telephone Record  [facility] Client [place] Night - Client Client Site [place] Physician Viviana Simpler- MD Contact Type Call Who Is Calling Patient / Member / Family / Caregiver Caller Name [place] Phone Number [phone] Patient Name Jennifer [name] Patient DOB [birth year] Call Type Message Only Information Provided Reason for Call Request to [facility] Appointment Initial Comment Caller needs to cancel an appointment. Disp. Time Disposition Final User 09/10/2020 8:50:14 PM General Information Provided Yes Ronnald Ramp, [place] Call Closed By: Eliane Decree Transaction Date/Time: 09/10/2020 8:48:16 PM (ET)

## 2020-09-12 ENCOUNTER — Telehealth: Payer: Self-pay

## 2020-09-12 ENCOUNTER — Ambulatory Visit (HOSPITAL_COMMUNITY): Payer: BC Managed Care – PPO | Admitting: Anesthesiology

## 2020-09-12 ENCOUNTER — Encounter (HOSPITAL_COMMUNITY): Payer: Self-pay | Admitting: Gastroenterology

## 2020-09-12 ENCOUNTER — Encounter (HOSPITAL_COMMUNITY)
Admission: RE | Disposition: A | Discharge status: Home / Self Care | Payer: Self-pay | Source: Home / Self Care | Attending: Gastroenterology

## 2020-09-12 ENCOUNTER — Other Ambulatory Visit: Payer: Self-pay

## 2020-09-12 ENCOUNTER — Ambulatory Visit (HOSPITAL_COMMUNITY)
Admission: RE | Admit: 2020-09-12 | Discharge: 2020-09-12 | Disposition: A | Discharge status: Home / Self Care | Payer: BC Managed Care – PPO | Attending: Gastroenterology | Admitting: Gastroenterology

## 2020-09-12 DIAGNOSIS — Z923 Personal history of irradiation: Secondary | ICD-10-CM | POA: Insufficient documentation

## 2020-09-12 DIAGNOSIS — Z79899 Other long term (current) drug therapy: Secondary | ICD-10-CM | POA: Insufficient documentation

## 2020-09-12 DIAGNOSIS — G43109 Migraine with aura, not intractable, without status migrainosus: Secondary | ICD-10-CM | POA: Diagnosis not present

## 2020-09-12 DIAGNOSIS — R935 Abnormal findings on diagnostic imaging of other abdominal regions, including retroperitoneum: Secondary | ICD-10-CM

## 2020-09-12 DIAGNOSIS — Z79890 Hormone replacement therapy (postmenopausal): Secondary | ICD-10-CM | POA: Diagnosis not present

## 2020-09-12 DIAGNOSIS — K219 Gastro-esophageal reflux disease without esophagitis: Secondary | ICD-10-CM | POA: Insufficient documentation

## 2020-09-12 DIAGNOSIS — K317 Polyp of stomach and duodenum: Secondary | ICD-10-CM | POA: Insufficient documentation

## 2020-09-12 DIAGNOSIS — Z79811 Long term (current) use of aromatase inhibitors: Secondary | ICD-10-CM | POA: Diagnosis not present

## 2020-09-12 DIAGNOSIS — K449 Diaphragmatic hernia without obstruction or gangrene: Secondary | ICD-10-CM | POA: Diagnosis not present

## 2020-09-12 DIAGNOSIS — Z791 Long term (current) use of non-steroidal anti-inflammatories (NSAID): Secondary | ICD-10-CM | POA: Diagnosis not present

## 2020-09-12 DIAGNOSIS — K839 Disease of biliary tract, unspecified: Secondary | ICD-10-CM

## 2020-09-12 DIAGNOSIS — K2970 Gastritis, unspecified, without bleeding: Secondary | ICD-10-CM | POA: Diagnosis not present

## 2020-09-12 DIAGNOSIS — R110 Nausea: Secondary | ICD-10-CM | POA: Diagnosis not present

## 2020-09-12 DIAGNOSIS — K838 Other specified diseases of biliary tract: Secondary | ICD-10-CM | POA: Diagnosis not present

## 2020-09-12 DIAGNOSIS — C50911 Malignant neoplasm of unspecified site of right female breast: Secondary | ICD-10-CM | POA: Diagnosis not present

## 2020-09-12 DIAGNOSIS — K319 Disease of stomach and duodenum, unspecified: Secondary | ICD-10-CM | POA: Diagnosis not present

## 2020-09-12 DIAGNOSIS — F419 Anxiety disorder, unspecified: Secondary | ICD-10-CM | POA: Diagnosis not present

## 2020-09-12 DIAGNOSIS — J309 Allergic rhinitis, unspecified: Secondary | ICD-10-CM | POA: Insufficient documentation

## 2020-09-12 DIAGNOSIS — K3189 Other diseases of stomach and duodenum: Secondary | ICD-10-CM | POA: Diagnosis not present

## 2020-09-12 DIAGNOSIS — E039 Hypothyroidism, unspecified: Secondary | ICD-10-CM | POA: Insufficient documentation

## 2020-09-12 HISTORY — PX: EUS: SHX5427

## 2020-09-12 HISTORY — PX: ESOPHAGOGASTRODUODENOSCOPY (EGD) WITH PROPOFOL: SHX5813

## 2020-09-12 HISTORY — PX: BIOPSY: SHX5522

## 2020-09-12 SURGERY — UPPER ENDOSCOPIC ULTRASOUND (EUS) RADIAL
Anesthesia: Monitor Anesthesia Care

## 2020-09-12 MED ORDER — PROPOFOL 10 MG/ML IV BOLUS
INTRAVENOUS | Status: DC | PRN
  Administered 2020-09-12 (×2): 20 mg via INTRAVENOUS

## 2020-09-12 MED ORDER — SODIUM CHLORIDE 0.9 % IV SOLN: INTRAVENOUS | Status: DC

## 2020-09-12 MED ORDER — LIDOCAINE 2% (20 MG/ML) 5 ML SYRINGE
INTRAMUSCULAR | Status: DC | PRN
  Administered 2020-09-12: 60 mg via INTRAVENOUS

## 2020-09-12 MED ORDER — PROPOFOL 500 MG/50ML IV EMUL
INTRAVENOUS | Status: AC
  Filled 2020-09-12: qty 50

## 2020-09-12 MED ORDER — PROPOFOL 500 MG/50ML IV EMUL
INTRAVENOUS | Status: DC | PRN
  Administered 2020-09-12: 125 ug/kg/min via INTRAVENOUS

## 2020-09-12 SURGICAL SUPPLY — 15 items

## 2020-09-12 NOTE — Interval H&P Note (Signed)
History and Physical Interval Note:  09/12/2020 8:48 AM  Jennifer [name]  has presented today for surgery, with the diagnosis of dilated bile duct, abnormal MRI.  The various methods of treatment have been discussed with the patient and family. After consideration of risks, benefits and other options for treatment, the patient has consented to  Procedure(s): UPPER ENDOSCOPIC ULTRASOUND (EUS) RADIAL (N/A) ESOPHAGOGASTRODUODENOSCOPY (EGD) WITH PROPOFOL (N/A) as a surgical intervention.  The patient's history has been reviewed, patient examined, no change in status, stable for surgery.  I have reviewed the patient's chart and labs.  Questions were answered to the patient's satisfaction.     [name]

## 2020-09-12 NOTE — Telephone Encounter (Signed)
Orders and reminder in epic.

## 2020-09-12 NOTE — Transfer of Care (Signed)
Immediate Anesthesia Transfer of Care Note  Patient: Kiaraliz Rafuse  Procedure(s) Performed: UPPER ENDOSCOPIC ULTRASOUND (EUS) RADIAL (N/A ) ESOPHAGOGASTRODUODENOSCOPY (EGD) WITH PROPOFOL (N/A ) BIOPSY  Patient Location: PACU  Anesthesia Type:MAC  Level of Consciousness: awake, alert  and oriented  Airway & Oxygen Therapy: Patient Spontanous Breathing and Patient connected to face mask oxygen  Post-op Assessment: Report given to RN and Post -op Vital signs reviewed and stable  Post vital signs: Reviewed and stable  Last Vitals:  Vitals Value Taken Time  BP    Temp    Pulse    Resp    SpO2      Last Pain:  Vitals:   09/12/20 0851  TempSrc: Oral  PainSc: 0-No pain      Patients Stated Pain Goal: 4 (75/43/60 6770)  Complications: No complications documented.

## 2020-09-12 NOTE — Op Note (Signed)
[facility] Patient Name: Jennifer [name] Procedure Date: 09/12/2020 MRN: [id number] Attending MD: [name] , MD Date of Birth: July 27, 1956 CSN: [id number] Age: 64 Admit Type: Outpatient Procedure:                Upper EUS Indications:              Transient abdominal pains, CT scan and MR show                            dilated bile duct (MRCP 20m CBD), normal GB; LFTs                            normal except slightly elevated Alk phos Providers:                DMilus Banister MD, MJosie Dixon RN, LParticia Nearing RN, ATyrone Apple Technician, P[address] CRNA Referring MD:             KThornton Park MD Medicines:                Monitored Anesthesia Care Complications:            No immediate complications. Estimated blood loss:                            None. Estimated Blood Loss:     Estimated blood loss: none. Procedure:                Pre-Anesthesia Assessment:                           - Prior to the procedure, a History and Physical                            was performed, and patient medications and                            allergies were reviewed. The patient's tolerance of                            previous anesthesia was also reviewed. The risks                            and benefits of the procedure and the sedation                            options and risks were discussed with the patient.                            All questions were answered, and informed consent  was obtained. Prior Anticoagulants: The patient has                            taken no previous anticoagulant or antiplatelet                            agents. ASA Grade Assessment: II - A patient with                            mild systemic disease. After reviewing the risks                            and benefits, the patient was deemed in                            satisfactory  condition to undergo the procedure.                           After obtaining informed consent, the endoscope was                            passed under direct vision. Throughout the                            procedure, the patient's blood pressure, pulse, and                            oxygen saturations were monitored continuously. The                            [id number]-Q180V ([id number]) Olympus Duodenoscope was                            introduced through the mouth, and advanced to the                            second part of duodenum. The [id number]-[id number] ([id number])                            Olympus Radial EUS was introduced through the                            mouth, and advanced to the second part of duodenum.                            The [id number]-[id number] ([id number]) Olympus gastroscope was                            introduced through the mouth, and advanced to the                            second part of duodenum. The upper EUS was                              accomplished without difficulty. The patient                            tolerated the procedure well. Scope In: Scope Out: Findings:      ENDOSCOPIC FINDING: :      1. Normal esophagus.      2. Mild, non-specific distal gastritis. Biopsied (jar 1). There were       numerous soft, fleshy polyps throughout the stomach ranging in size from       3mm to 1.5cm. These polyps were sampled with biopsy forceps (jar 2).      3. Normal duodenum including very good visualization of a normal       appearing major papilla with side viewing duodenoscope.      ENDOSONOGRAPHIC FINDING: :      1. CBD was slightly dilated (up to 8.2mm) without associated stones or       masses.      2. Pancreatic parenchyma was normal throughout: no discrete masses or       signs of chronic pancreatitis.      3. No peripancreatic, periportal adenopathy.      4. Main pancreatic duct was normal, non-dilated.      5. Limited views of the gallbladder, liver, spleen, portal  and splenic       vessels were all normal. Impression:               - Mild, non-specific gastritis, biopsied to check                            for H. pylori.                           - Numerous gastric polyps, likely fundic gland.                            These were sampled with biopsy.                           - Normal major papilla.                           - Slightly dilated CBD (8mm) without a clear                            anatomic cause. Moderate Sedation:      Not Applicable - Patient had care per Anesthesia. Recommendation:           - Discharge patient to home.                           - Await final pathology.                           - Follow LFTs periodically (would repeat in 2                            months) Procedure Code(s):        --- Professional ---                             43237, Esophagogastroduodenoscopy, flexible,                            transoral; with endoscopic ultrasound examination                            limited to the esophagus, stomach or duodenum, and                            adjacent structures                           43239, Esophagogastroduodenoscopy, flexible,                            transoral; with biopsy, single or multiple Diagnosis Code(s):        --- Professional ---                           K29.70, Gastritis, unspecified, without bleeding                           K83.8, Other specified diseases of biliary tract CPT copyright 2019 [employer]. All rights reserved. The codes documented in this report are preliminary and upon coder review may  be revised to meet current compliance requirements. [name], MD 09/12/2020 10:44:08 AM This report has been signed electronically. Number of Addenda: 0 

## 2020-09-12 NOTE — Telephone Encounter (Signed)
-----   Message from Thornton Park, MD sent at 09/12/2020 12:33 PM EDT ----- Regarding: FW: Please arrange for LFTs in 2 months. Include GGTP.  Thank you.   KLB ----- Message ----- From: [name], MD Sent: 09/12/2020  10:46 AM EDT To: Thornton Park, MD  Joelene Millin,  I just completed her EUS, EGD. See full report in epic.  I'll let you know about the final path results.  Probably should follow her LFTs over time (next check in 2 months)  Thanks  DJ   - Mild, non-specific gastritis, biopsied to check for H. pylori. - Numerous gastric polyps, likely fundic gland. These were sampled with biopsy. - Normal major papilla. - Slightly dilated CBD (72mm) without a clear anatomic cause.

## 2020-09-12 NOTE — Anesthesia Preprocedure Evaluation (Addendum)
Anesthesia Evaluation  Patient identified by MRN, date of birth, ID band Patient awake    Reviewed: Allergy & Precautions, NPO status , Patient's Chart, lab work & pertinent test results, reviewed documented beta blocker date and time   History of Anesthesia Complications Negative for: history of anesthetic complications  Airway Mallampati: II  TM Distance: >3 FB Neck ROM: Full    Dental no notable dental hx. (+) Teeth Intact, Dental Advisory Given   Pulmonary neg pulmonary ROS,    Pulmonary exam normal breath sounds clear to auscultation       Cardiovascular Normal cardiovascular exam Rhythm:Regular Rate:Normal  Echo 2018: nml   Neuro/Psych  Headaches, PSYCHIATRIC DISORDERS Anxiety    GI/Hepatic hiatal hernia, GERD  Medicated and Controlled,CBD dilation   Endo/Other  Hypothyroidism   Renal/GU negative Renal ROS  negative genitourinary   Musculoskeletal  (+) Arthritis , Osteoarthritis,    Abdominal Normal abdominal exam  (+)   Peds negative pediatric ROS (+)  Hematology  (+) Blood dyscrasia, anemia ,   Anesthesia Other Findings   Reproductive/Obstetrics negative OB ROS                            Anesthesia Physical Anesthesia Plan  ASA: III  Anesthesia Plan: MAC   Post-op Pain Management:    Induction:   PONV Risk Score and Plan: 2 and Propofol infusion and TIVA  Airway Management Planned: Natural Airway and Simple Face Mask  Additional Equipment: None  Intra-op Plan:   Post-operative Plan:   Informed Consent: I have reviewed the patients History and Physical, chart, labs and discussed the procedure including the risks, benefits and alternatives for the proposed anesthesia with the patient or authorized representative who has indicated his/her understanding and acceptance.       Plan Discussed with: CRNA  Anesthesia Plan Comments:        Anesthesia Quick  Evaluation

## 2020-09-12 NOTE — Anesthesia Postprocedure Evaluation (Signed)
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  Procedure(s) Performed: UPPER ENDOSCOPIC ULTRASOUND (EUS) RADIAL (N/A ) ESOPHAGOGASTRODUODENOSCOPY (EGD) WITH PROPOFOL (N/A ) BIOPSY     Patient location during evaluation: PACU Anesthesia Type: MAC Level of consciousness: awake and alert Pain management: pain level controlled Vital Signs Assessment: post-procedure vital signs reviewed and stable Respiratory status: spontaneous breathing, nonlabored ventilation and respiratory function stable Cardiovascular status: blood pressure returned to baseline and stable Postop Assessment: no apparent nausea or vomiting Anesthetic complications: no   No complications documented.  Last Vitals:  Vitals:   09/12/20 1044 09/12/20 1055  BP: (!) 97/54 (!) 105/46  Pulse: 60 61  Resp: 20 15  Temp: 36.5 C   SpO2: 100% 100%    Last Pain:  Vitals:   09/12/20 1055  TempSrc:   PainSc: 0-No pain                 Jennifer [name]

## 2020-09-12 NOTE — Discharge Instructions (Signed)
YOU HAD AN ENDOSCOPIC PROCEDURE TODAY: Refer to the procedure report and other information in the discharge instructions given to you for any specific questions about what was found during the examination. If this information does not answer your questions, please call [place] office at [phone] to clarify.   YOU SHOULD EXPECT: Some feelings of bloating in the abdomen. Passage of more gas than usual. Walking can help get rid of the air that was put into your GI tract during the procedure and reduce the bloating. If you had a lower endoscopy (such as a colonoscopy or flexible sigmoidoscopy) you may notice spotting of blood in your stool or on the toilet paper. Some abdominal soreness may be present for a day or two, also.  DIET: Your first meal following the procedure should be a light meal and then it is ok to progress to your normal diet. A half-sandwich or bowl of soup is an example of a good first meal. Heavy or fried foods are harder to digest and may make you feel nauseous or bloated. Drink plenty of fluids but you should avoid alcoholic beverages for 24 hours. If you had a esophageal dilation, please see attached instructions for diet.    ACTIVITY: Your care partner should take you home directly after the procedure. You should plan to take it easy, moving slowly for the rest of the day. You can resume normal activity the day after the procedure however YOU SHOULD NOT DRIVE, use power tools, machinery or perform tasks that involve climbing or major physical exertion for 24 hours (because of the sedation medicines used during the test).   SYMPTOMS TO REPORT IMMEDIATELY: A gastroenterologist can be reached at any hour. Please call [phone]  for any of the following symptoms:   Following upper endoscopy (EGD, EUS, ERCP, esophageal dilation) Vomiting of blood or coffee ground material  New, significant abdominal pain  New, significant chest pain or pain under the shoulder blades  Painful or  persistently difficult swallowing  New shortness of breath  Black, tarry-looking or red, bloody stools  FOLLOW UP:  If any biopsies were taken you will be contacted by phone or by letter within the next 1-3 weeks. Call [phone]  if you have not heard about the biopsies in 3 weeks.  Please also call with any specific questions about appointments or follow up tests.  

## 2020-09-13 ENCOUNTER — Other Ambulatory Visit: Payer: Self-pay

## 2020-09-13 ENCOUNTER — Encounter (HOSPITAL_COMMUNITY): Payer: Self-pay | Admitting: Gastroenterology

## 2020-09-13 LAB — SURGICAL PATHOLOGY

## 2020-09-23 ENCOUNTER — Other Ambulatory Visit: Payer: Self-pay | Admitting: Family Medicine

## 2020-09-23 NOTE — Telephone Encounter (Signed)
Pharmacy requests refill on: Levothyroxine 75 mcg Daily   LAST REFILL: 02/28/2020 LAST OV: 09/06/20 Common Bile Duct Dilatation NEXT OV: Not Scheduled  PHARMACY: [place] [address] [place], [place]   TSH was WNL on 02/26/2020

## 2020-09-23 NOTE — Telephone Encounter (Signed)
Pt left v/m that [facility] is going to be requesting refill on levothyroxine and pt wanted Dr Marliss Coots CMA know that this is correct to send to [facility] due to pt traveling.

## 2020-09-24 NOTE — Telephone Encounter (Signed)
Pt seen 07/10/20.

## 2020-09-27 ENCOUNTER — Other Ambulatory Visit: Payer: Self-pay | Admitting: Hematology and Oncology

## 2020-10-03 ENCOUNTER — Other Ambulatory Visit: Payer: BC Managed Care – PPO

## 2020-10-04 ENCOUNTER — Other Ambulatory Visit (HOSPITAL_COMMUNITY): Payer: BC Managed Care – PPO

## 2020-10-08 ENCOUNTER — Encounter: Payer: Self-pay | Admitting: Emergency Medicine

## 2020-10-08 ENCOUNTER — Emergency Department
Admission: EM | Admit: 2020-10-08 | Discharge: 2020-10-08 | Disposition: A | Discharge status: Home / Self Care | Payer: BC Managed Care – PPO | Attending: Emergency Medicine | Admitting: Emergency Medicine

## 2020-10-08 ENCOUNTER — Emergency Department: Payer: BC Managed Care – PPO

## 2020-10-08 ENCOUNTER — Other Ambulatory Visit: Payer: Self-pay

## 2020-10-08 DIAGNOSIS — Z20822 Contact with and (suspected) exposure to covid-19: Secondary | ICD-10-CM | POA: Diagnosis not present

## 2020-10-08 DIAGNOSIS — Z853 Personal history of malignant neoplasm of breast: Secondary | ICD-10-CM | POA: Insufficient documentation

## 2020-10-08 DIAGNOSIS — M542 Cervicalgia: Secondary | ICD-10-CM | POA: Diagnosis not present

## 2020-10-08 DIAGNOSIS — Z79899 Other long term (current) drug therapy: Secondary | ICD-10-CM | POA: Insufficient documentation

## 2020-10-08 DIAGNOSIS — Z794 Long term (current) use of insulin: Secondary | ICD-10-CM | POA: Diagnosis not present

## 2020-10-08 DIAGNOSIS — R7303 Prediabetes: Secondary | ICD-10-CM | POA: Diagnosis not present

## 2020-10-08 DIAGNOSIS — R059 Cough, unspecified: Secondary | ICD-10-CM | POA: Diagnosis not present

## 2020-10-08 DIAGNOSIS — G43019 Migraine without aura, intractable, without status migrainosus: Secondary | ICD-10-CM | POA: Diagnosis not present

## 2020-10-08 DIAGNOSIS — E039 Hypothyroidism, unspecified: Secondary | ICD-10-CM | POA: Insufficient documentation

## 2020-10-08 DIAGNOSIS — I10 Essential (primary) hypertension: Secondary | ICD-10-CM | POA: Diagnosis not present

## 2020-10-08 DIAGNOSIS — R5382 Chronic fatigue, unspecified: Secondary | ICD-10-CM | POA: Diagnosis not present

## 2020-10-08 DIAGNOSIS — G43719 Chronic migraine without aura, intractable, without status migrainosus: Secondary | ICD-10-CM | POA: Diagnosis not present

## 2020-10-08 DIAGNOSIS — B349 Viral infection, unspecified: Secondary | ICD-10-CM | POA: Insufficient documentation

## 2020-10-08 LAB — CBC
HCT: 38 % (ref 36.0–46.0)
Hemoglobin: 11.8 g/dL — ABNORMAL LOW (ref 12.0–15.0)
MCH: 24.8 pg — ABNORMAL LOW (ref 26.0–34.0)
MCHC: 31.1 g/dL (ref 30.0–36.0)
MCV: 79.8 fL — ABNORMAL LOW (ref 80.0–100.0)
Platelets: 357 10*3/uL (ref 150–400)
RBC: 4.76 MIL/uL (ref 3.87–5.11)
RDW: 14.7 % (ref 11.5–15.5)
WBC: 8.7 10*3/uL (ref 4.0–10.5)
nRBC: 0 % (ref 0.0–0.2)

## 2020-10-08 LAB — COMPREHENSIVE METABOLIC PANEL
ALT: 19 U/L (ref 0–44)
AST: 20 U/L (ref 15–41)
Albumin: 4.4 g/dL (ref 3.5–5.0)
Alkaline Phosphatase: 104 U/L (ref 38–126)
Anion gap: 10 (ref 5–15)
BUN: 13 mg/dL (ref 8–23)
CO2: 28 mmol/L (ref 22–32)
Calcium: 9.3 mg/dL (ref 8.9–10.3)
Chloride: 94 mmol/L — ABNORMAL LOW (ref 98–111)
Creatinine, Ser: 0.7 mg/dL (ref 0.44–1.00)
GFR, Estimated: >60 mL/min (ref >60)
Glucose, Bld: 158 mg/dL — ABNORMAL HIGH (ref 70–99)
Potassium: 4.2 mmol/L (ref 3.5–5.1)
Sodium: 132 mmol/L — ABNORMAL LOW (ref 135–145)
Total Bilirubin: 0.6 mg/dL (ref 0.3–1.2)
Total Protein: 7.9 g/dL (ref 6.5–8.1)

## 2020-10-08 LAB — RESP PANEL BY RT PCR (RSV, FLU A&B, COVID)
Influenza A by PCR: NEGATIVE
Influenza B by PCR: NEGATIVE
Respiratory Syncytial Virus by PCR: NEGATIVE
SARS Coronavirus 2 by RT PCR: NEGATIVE

## 2020-10-08 LAB — LIPASE, BLOOD: Lipase: 26 U/L (ref 11–51)

## 2020-10-08 MED ORDER — ONDANSETRON HCL 4 MG/2ML IJ SOLN
4.0000 mg | Freq: Once | INTRAMUSCULAR | Status: AC
  Administered 2020-10-08: 4 mg via INTRAVENOUS
  Filled 2020-10-08: qty 2

## 2020-10-08 MED ORDER — BUTALBITAL-APAP-CAFFEINE 50-325-40 MG PO TABS
1.0000 | ORAL_TABLET | Freq: Once | ORAL | Status: DC
  Filled 2020-10-08: qty 1

## 2020-10-08 MED ORDER — ONDANSETRON 4 MG PO TBDP: 4.0000 mg | ORAL_TABLET | Freq: Three times a day (TID) | ORAL | 0 refills | Status: AC | PRN

## 2020-10-08 MED ORDER — KETOROLAC TROMETHAMINE 30 MG/ML IJ SOLN
15.0000 mg | Freq: Once | INTRAMUSCULAR | Status: AC
  Administered 2020-10-08: 15 mg via INTRAVENOUS
  Filled 2020-10-08: qty 1

## 2020-10-08 MED ORDER — LACTATED RINGERS IV BOLUS
1000.0000 mL | Freq: Once | INTRAVENOUS | Status: AC
  Administered 2020-10-08: 1000 mL via INTRAVENOUS

## 2020-10-08 NOTE — ED Triage Notes (Signed)
Pt to ED from home c/o productive clear cough and feeling fatigued for a couple days.  Pt states nausea and vomiting yesterda x10 after eating.  States migraine as well with hx of same.  Pt A&Ox4, chest rise even and unlabored, has been able to keep some liquids down.

## 2020-10-08 NOTE — ED Provider Notes (Addendum)
[facility] Emergency Department Provider Note  ____________________________________________  Time seen: Approximately 3:44 AM  I have reviewed the triage vital signs and the nursing notes.   HISTORY  Chief Complaint Cough and Fatigue   HPI Jennifer [name] is a 64 y.o. female with history of pretty severe migraines on steroid trigger injections, Botox injections, and triptan's, hypothyroidism, anemia who presents for evaluation of Covid-like symptoms.  Patient reports she spent 3 weeks in [place].  She returned home a week ago.  3 days ago she started having a cough productive of clear sputum, generalized fatigue, and started having her migraines.   Yesterday she started with nausea and several episodes of nonbloody nonbilious emesis after eating.  No sore throat, no loss of taste or smell.  Patient has received her Covid vaccination including a booster a month ago.  No known exposures to Covid.  She has been tested twice over the last 3 days for Covid and both negative.  She is complaining of diffuse throbbing headache which is consistent with her history of migraine headaches.  Past Medical History:  Diagnosis Date  . Allergic rhinitis   . Anxiety   . Arthritis    hands, knees  . Breast cancer ([place]) 2019   Right Breast Cancer  . Cancer ([place]) 09/2018   Breast CA new DX, right breast  . Cervical dysplasia   . Common bile duct dilation   . Endometriosis   . Esophageal reflux   . Family history of adverse reaction to anesthesia    younger sister anxiety for a dew days after  . Family history of breast cancer   . Family history of breast cancer   . Family history of prostate cancer   . Hepatic cyst 08/2020   multiple  . Hepatic hemangioma 08/2020   intrahepatic hemangioma  . Hiatal hernia   . Hypothyroid   . Migraine with aura, without mention of intractable migraine without mention of status migrainosus   . Osteopenia 08/2019   T score -2.2 FRAX 11% /  1.7%  . Personal history of radiation therapy 2019   Right Breast Cancer  . Pre-diabetes     Patient Active Problem List   Diagnosis Date Noted  . Anemia 09/08/2020  . Common bile duct dilatation 09/06/2020  . Opacity of lung on imaging study 09/06/2020  . Weakness of right foot 07/10/2020  . Dizziness 07/10/2020  . History of breast cancer 03/01/2020  . Genetic testing 11/14/2019  . Bilateral hand pain 11/03/2019  . Family history of prostate cancer   . Adnexal cyst 09/26/2019  . Cystitis with hematuria 09/26/2019  . Pain of left calf 03/21/2019  . Use of anastrozole (Arimidex) 03/21/2019  . Elevated transaminase level 01/03/2019  . Family history of breast cancer   . Stress reaction 09/26/2018  . Malignant neoplasm of upper-inner quadrant of right breast in female, estrogen receptor positive ([place]) 09/20/2018  . B12 deficiency 04/13/2018  . Joint pain 04/11/2018  . Paresthesia 04/11/2018  . Colon cancer screening 09/16/2017  . Prediabetes 07/28/2016  . Allergic rhinitis 11/13/2014  . Screening for lipoid disorders 09/26/2014  . Preventative health care 09/26/2014  . Routine general medical examination at a health care facility 10/28/2012  . Herpes labialis 04/28/2011  . GERD 09/16/2010  . Hypothyroidism 05/07/2008  . Osteopenia 05/07/2008  . Migraine with aura 11/11/2007    Past Surgical History:  Procedure Laterality Date  . BIOPSY  09/12/2020   Procedure: BIOPSY;  Surgeon: Ardis Hughs,  Melene Plan, MD;  Location: [facility];  Service: Endoscopy;;  . BREAST EXCISIONAL BIOPSY Bilateral over 10 years ago   benign x 2   . BREAST LUMPECTOMY Right 10/10/2018  . BREAST LUMPECTOMY WITH RADIOACTIVE SEED AND SENTINEL LYMPH NODE BIOPSY Right 10/10/2018   Procedure: RIGHT BREAST LUMPECTOMY WITH RADIOACTIVE SEED AND RIGHT SENTINEL LYMPH NODE BIOPSY;  Surgeon: Jovita Kussmaul, MD;  Location: [place];  Service: General;  Laterality: Right;  . BREAST SURGERY      Benign breast lump excised  . CERVICAL CONE BIOPSY  1985   severe dysplasia  . COLONOSCOPY  2005 and dec 2019   normal  . COLPOSCOPY    . endoscopy  10/2018  . ESOPHAGOGASTRODUODENOSCOPY (EGD) WITH PROPOFOL N/A 09/12/2020   Procedure: ESOPHAGOGASTRODUODENOSCOPY (EGD) WITH PROPOFOL;  Surgeon: [name], MD;  Location: [facility];  Service: Endoscopy;  Laterality: N/A;  . EUS N/A 09/12/2020   Procedure: UPPER ENDOSCOPIC ULTRASOUND (EUS) RADIAL;  Surgeon: [name], MD;  Location: [facility];  Service: Endoscopy;  Laterality: N/A;  . LAPAROSCOPIC ASSISTED VAGINAL HYSTERECTOMY    . LAPAROSCOPIC BILATERAL SALPINGO OOPHERECTOMY Bilateral 10/27/2019   Procedure: LAPAROSCOPIC BILATERAL SALPINGO OOPHORECTOMY WITH PERITONEAL WASHINGS;  Surgeon: Princess Bruins, MD;  Location: [place];  Service: Gynecology;  Laterality: Bilateral;  request 1:00pm on Friday, Dec. 4th in [place] time held requests one hour OR time  . moles removed from upper back, face lft, 1 between breasts, upper leg left inside  10/18/2019   wearing small round bandaids on for 2 weeks  . ROTATOR CUFF REPAIR Bilateral 08/2008    Prior to Admission medications   Medication Sig Start Date End Date Taking? Authorizing Provider  ALPRAZolam (XANAX) 0.5 MG tablet TAKE 1/2 TO 1 TABLET BY MOUTH ONCE DAILY AS NEEDED FOR SLEEP/ANXIETY Patient taking differently: Take 0.125 mg by mouth daily as needed for anxiety or sleep.  06/25/20   Tower, [name], MD  atenolol (TENORMIN) 25 MG tablet Take 12.5 mg by mouth 3 (three) times daily. For migraine prevention    [provider]  CALCIUM CARBONATE-VITAMIN D PO Take 1 tablet by mouth daily. 1000 mg    [provider]  famotidine (PEPCID) 40 MG tablet Take 40 mg by mouth 2 (two) times daily.     [provider]  glucose blood (TRUE METRIX BLOOD GLUCOSE TEST) test strip Use to check blood sugar two times a day. (dx. R73.03)  08/21/20   Tower, [name], MD  hydrocortisone 2.5 % cream Apply topically 2 (two) times daily as needed (Rash). Patient taking differently: Apply 1 application topically daily as needed (Rash).  07/04/20   Moye, [place], MD  hydrOXYzine (ATARAX/VISTARIL) 25 MG tablet Take 1 tablet (25 mg total) by mouth 3 (three) times daily as needed. Patient taking differently: Take 31.25 mg by mouth 2 (two) times daily.  10/18/17   Triplett, Johnette Abraham B, FNP  ketorolac (TORADOL) 10 MG tablet Take 1 tablet (10 mg total) by mouth every 6 (six) hours as needed for moderate pain. Patient taking differently: Take 2.5 mg by mouth 2 (two) times a week.  09/07/20   Carrie Mew, MD  Lancets (ACCU-CHEK MULTICLIX) lancets Use to check blood sugar two times a day. (dx.  R73.03) 08/21/20   Tower, [name], MD  letrozole [facility]) 2.5 MG tablet TAKE 1 TABLET BY MOUTH EVERY DAY 09/27/20   Nicholas Lose, MD  levocetirizine (XYZAL) 5 MG tablet Take 5  mg by mouth at bedtime.  10/28/17   [provider]  levothyroxine (SYNTHROID) 75 MCG tablet TAKE ONE TABLET BY MOUTH DAILY BEFORE BREAKFAST 09/23/20   Tower, [name], MD  Magnesium Oxide 400 (240 Mg) MG TABS Take 400 mg by mouth daily.     [provider]  Multiple Vitamin (MULTIVITAMIN WITH MINERALS) TABS tablet Take 1 tablet by mouth daily.    [provider]  Omega-3 Fatty Acids (FISH OIL) 1000 MG CAPS Take 1,000 mg by mouth daily.     [provider]  OnabotulinumtoxinA (BOTOX IJ) Inject 1 Dose as directed every 6 (six) weeks.     [provider]  ondansetron (ZOFRAN ODT) 4 MG disintegrating tablet Take 1 tablet (4 mg total) by mouth every 8 (eight) hours as needed. 10/08/20   Alfred Levins, [place], MD  pantoprazole (PROTONIX) 40 MG tablet TAKE ONE TABLET BY MOUTH ONE TIME DAILY Patient taking differently: Take 40 mg by mouth daily.  08/27/20   Tower, [name], MD  PRESCRIPTION MEDICATION Inject into the skin every 6 (six) weeks. Steroid  Trigger    [provider]  Probiotic Product (PROBIOTIC-10 PO) Take 1 capsule by mouth daily.     [provider]  promethazine (PHENERGAN) 25 MG tablet Take 12.5 mg by mouth daily as needed for nausea or vomiting. When having a migraine    [provider]  rizatriptan (MAXALT) 10 MG tablet Take 1 tablet (10 mg total) by mouth once as needed for up to 10 days for migraine. May repeat in 2 hours if needed 11/03/18 09/11/20  Menshew, Dannielle Karvonen, PA-C  TURMERIC PO Take 1 tablet by mouth daily.     [provider]    Allergies Ciprofloxacin, Codeine phosphate, Decongestant [pseudoephedrine], Flonase [fluticasone propionate], Hydrocodone, Reglan [metoclopramide], Topamax [topiramate], Benadryl [diphenhydramine hcl], and Sulfa antibiotics  Family History  Problem Relation Age of Onset  . Breast cancer Mother 50  . Diabetes Mother   . Hypertension Father   . Heart disease Father   . Osteoporosis Maternal Grandmother   . Diabetes Maternal Grandmother   . Hypertension Sister   . COPD Paternal Grandmother   . Heart disease Paternal Grandfather   . Breast cancer Cousin 50       pat first cousin  . Breast cancer Cousin        mat second cousin with breast cancer in her 68s  . Prostate cancer Other 47  . Thyroid cancer Cousin 23       pat first cousin  . Colon cancer Neg Hx   . Stomach cancer Neg Hx   . Esophageal cancer Neg Hx   . Pancreatic cancer Neg Hx     Social History Social History   Tobacco Use  . Smoking status: Never Smoker  . Smokeless tobacco: Never Used  Vaping Use  . Vaping Use: Never used  Substance Use Topics  . Alcohol use: No    Alcohol/week: 0.0 standard drinks  . Drug use: No    Review of Systems  Constitutional: Negative for fever. + malaise, weakness Eyes: Negative for visual changes. ENT: Negative for sore throat. Neck: No neck pain  Cardiovascular: Negative for chest pain. Respiratory: Negative for shortness  of breath. + cough Gastrointestinal: Negative for abdominal pain or diarrhea. + N/V Genitourinary: Negative for dysuria. Musculoskeletal: Negative for back pain. Skin: Negative for rash. Neurological: Negative for weakness or numbness. + HA Psych: No SI or HI  ____________________________________________  PHYSICAL EXAM:  VITAL SIGNS: ED Triage Vitals  Enc Vitals Group     BP 10/08/20 0216 (!) 149/69     Pulse Rate 10/08/20 0216 73     Resp 10/08/20 0216 16     Temp 10/08/20 0216 99.2 F (37.3 C)     Temp Source 10/08/20 0216 Oral     SpO2 10/08/20 0216 95 %     Weight 10/08/20 0210 130 lb (59 kg)     Height 10/08/20 0210 5\' 1"  (1.549 m)     Head Circumference --      Peak Flow --      Pain Score 10/08/20 0210 7     Pain Loc --      Pain Edu? --      Excl. in [place]? --     Constitutional: Alert and oriented. Well appearing and in no apparent distress. HEENT:      Head: Normocephalic and atraumatic.         Eyes: Conjunctivae are normal. Sclera is non-icteric.       Mouth/Throat: Mucous membranes are moist.       Neck: Supple with no signs of meningismus. Cardiovascular: Regular rate and rhythm. No murmurs, gallops, or rubs.  Respiratory: Normal respiratory effort. Lungs are clear to auscultation bilaterally.  Gastrointestinal: Soft, non tender, and non distended. Musculoskeletal:  No edema, cyanosis, or erythema of extremities. Neurologic: Normal speech and language. Face is symmetric. EOMI, PERRL, normal gait, normal strength and sensation x 4, no dysmetria Skin: Skin is warm, dry and intact. No rash noted. Psychiatric: Mood and affect are normal. Speech and behavior are normal.  ____________________________________________   LABS (all labs ordered are listed, but only abnormal results are displayed)  Labs Reviewed  COMPREHENSIVE METABOLIC PANEL - Abnormal; Notable for the following components:      Result Value   Sodium 132 (*)    Chloride 94 (*)    Glucose,  Bld 158 (*)    All other components within normal limits  CBC - Abnormal; Notable for the following components:   Hemoglobin 11.8 (*)    MCV 79.8 (*)    MCH 24.8 (*)    All other components within normal limits  RESP PANEL BY RT PCR (RSV, FLU A&B, COVID)  LIPASE, BLOOD   ____________________________________________  EKG  ED ECG REPORT I, Rudene Re, the attending physician, personally viewed and interpreted this ECG.  Sinus rhythm, rate of 67, normal intervals, normal axis, no ST elevations or depressions, TWI in inferior leads.  Unchanged from prior from October 2021 ____________________________________________  RADIOLOGY  I have personally reviewed the images performed during this visit and I agree with the Radiologist's read.   Interpretation by Radiologist:  DG Chest 2 View  Result Date: 10/08/2020 CLINICAL DATA:  Productive cough EXAM: CHEST - 2 VIEW COMPARISON:  09/24/2018 FINDINGS: Lungs are well expanded, symmetric, and clear. No pneumothorax or pleural effusion. Cardiac size within normal limits. Pulmonary vascularity is normal. Osseous structures are age-appropriate. No acute bone abnormality. Surgical clips are seen within the right axilla. IMPRESSION: No active cardiopulmonary disease. Electronically Signed   By: Fidela Salisbury MD   On: 10/08/2020 02:54     ____________________________________________   PROCEDURES  Procedure(s) performed:yes .1-3 Lead EKG Interpretation Performed by: Rudene Re, MD Authorized by: Rudene Re, MD     Interpretation: normal     ECG rate assessment: normal     Rhythm: sinus rhythm     Ectopy: none  Critical Care performed:  None ____________________________________________   INITIAL IMPRESSION / ASSESSMENT AND PLAN / ED COURSE   64 y.o. female with history of pretty severe migraines on steroid trigger injections, Botox injections, and triptan's, hypothyroidism, anemia who presents for evaluation  of Covid-like symptoms x 3 days.  Patient is fully vaccinated including booster shot.  She is well-appearing in no distress with normal work of breathing, normal sats, vital signs showing temp of 99.2, lungs are clear to auscultation, neurologically intact.  We will check for Covid, flu, RSV.  Chest x-ray visualized by me with no signs of pneumonia, confirmed by radiology.  Labs with no significant electrolyte derangements, mild hyperglycemia nonfasting with a sugar of 158, no anion gap, normal LFTs, no leukocytosis, stable mild anemia.  We will treat her symptoms with IV fluids, IV Zofran, IV Toradol.  Old medical records reviewed.  _________________________ 6:22 AM on 10/08/2020 -----------------------------------------   Flu, Covid, RSV negative.  After fluids, Toradol and Zofran patient feels markedly improved.  Headache has resolved.  Remains extremely well-appearing.  At this time she is stable for discharge home.  Will provide a prescription for Zofran.  Discussed my standard return precautions and follow-up with PCP.    _____________________________________________ Please note:  Patient was evaluated in Emergency Department today for the symptoms described in the history of present illness. Patient was evaluated in the context of the global COVID-19 pandemic, which necessitated consideration that the patient might be at risk for infection with the SARS-CoV-2 virus that causes COVID-19. Institutional protocols and algorithms that pertain to the evaluation of patients at risk for COVID-19 are in a state of rapid change based on information released by regulatory bodies including the CDC and federal and state organizations. These policies and algorithms were followed during the patient's care in the ED.  Some ED evaluations and interventions may be delayed as a result of limited staffing during the pandemic.   [place] Controlled Substance Database was reviewed by  me. ____________________________________________   FINAL CLINICAL IMPRESSION(S) / ED DIAGNOSES   Final diagnoses:  Viral illness      NEW MEDICATIONS STARTED DURING THIS VISIT:  ED Discharge Orders         Ordered    ondansetron (ZOFRAN ODT) 4 MG disintegrating tablet  Every 8 hours PRN        10/08/20 0453           Note:  This document was prepared using Dragon voice recognition software and may include unintentional dictation errors.    Alfred Levins, [place], MD 10/08/20 [place], [place], MD 10/08/20 [phone]

## 2020-10-08 NOTE — ED Notes (Signed)
Assumed care of pt upon being roomed, medicated per MAR. AOx4, talking in full and clear sentences with regular and unlabored breathing. C/o L sided frontal lobe pain with photosensitivity and sound sensitivity

## 2020-10-08 NOTE — Progress Notes (Signed)
Patient Care Team: Tower, [name], MD as PCP - General Nicholas Lose, MD as Consulting Physician (Hematology and Oncology) Kyung Rudd, MD as Consulting Physician (Radiation Oncology) Jovita Kussmaul, MD as Consulting Physician (General Surgery)  DIAGNOSIS:    ICD-10-CM   1. Malignant neoplasm of upper-inner quadrant of right breast in female, estrogen receptor positive ([place])  C50.211    Z17.0     SUMMARY OF ONCOLOGIC HISTORY: Oncology History  Malignant neoplasm of upper-inner quadrant of right breast in female, estrogen receptor positive ([place])  09/13/2018 Initial Diagnosis   Diagnostic mammogram detected right breast mass with distortion LIQ at 2:00 9 cm from nipple 1.7 cm, at 1:00 there was a benign 1.2 cm fibrocystic change, biopsy of the 2:00 mass revealed grade 1 IDC with DCIS ER 100%, PR 90%, Ki-67 10%, HER-2 1+ negative, T1CN0 stage Ia clinical stage   10/10/2018 Surgery   Right lumpectomy: IDC grade 1, 1.2 cm, with intermediate grade DCIS, margins negative, 0/2 lymph nodes negative, T1CN0 stage Ia ER 100%, PR 90%, Ki-67 10%, HER-2 1+ negative    10/10/2018 Oncotype testing   oncotype results of 29: Risk of recurrence without chemo 18%   11/07/2018 - 12/08/2018 Radiation Therapy   1. Right Breast / 42.56 Gy in 16 fractions 2. Right Breast Boost / 8 Gy in 4 fractions - Total dose 50.56 Gy   12/2018 -  Anti-estrogen oral therapy   Anastrozole daily, planned for 7 years   11/11/2019 Genetic Testing   Negative genetic testing on the common hereditary cancer panel.  The Common Hereditary Gene Panel offered by [facility] includes sequencing and/or deletion duplication testing of the following 48 genes: APC, ATM, AXIN2, BARD1, BMPR1A, BRCA1, BRCA2, BRIP1, CDH1, CDK4, CDKN2A (p14ARF), CDKN2A (p16INK4a), CHEK2, CTNNA1, DICER1, EPCAM (Deletion/duplication testing only), GREM1 (promoter region deletion/duplication testing only), KIT, MEN1, MLH1, MSH2, MSH3, MSH6, MUTYH, NBN, NF1, NHTL1,  PALB2, PDGFRA, PMS2, POLD1, POLE, PTEN, RAD50, RAD51C, RAD51D, RNF43, SDHB, SDHC, SDHD, SMAD4, SMARCA4. STK11, TP53, TSC1, TSC2, and VHL.  The following genes were evaluated for sequence changes only: SDHA and HOXB13 c.251G>A variant only. The report date is 11/11/2019     CHIEF COMPLIANT: Follow-up of right breast cancer on anastrozole  INTERVAL HISTORY: Jennifer [name] is a 64 y.o. with above-mentioned history of right breast cancer who underwent lumpectomy, radiation, and is currently on anastrozole. Mammogram on 09/12/19 showed no evidence of malignancy bilaterally and an enlarged left axillary lymph node, likely due to a recent COVID vaccination. She presents to the clinic today for follow-up.    She had extensive work-ups done recently for abdominal pain.  She had CT of the abdomen followed by MRCP.  She is following with a gastroenterologist.  There was biliary duct dilatation but no abnormalities.  Endoscopies were apparently negative other than gastric polyps.  ALLERGIES:  is allergic to ciprofloxacin, codeine phosphate, decongestant [pseudoephedrine], flonase [fluticasone propionate], hydrocodone, reglan [metoclopramide], topamax [topiramate], benadryl [diphenhydramine hcl], and sulfa antibiotics.  MEDICATIONS:  Current Outpatient Medications  Medication Sig Dispense Refill  . ALPRAZolam (XANAX) 0.5 MG tablet TAKE 1/2 TO 1 TABLET BY MOUTH ONCE DAILY AS NEEDED FOR SLEEP/ANXIETY (Patient taking differently: Take 0.125 mg by mouth daily as needed for anxiety or sleep. ) 15 tablet 0  . atenolol (TENORMIN) 25 MG tablet Take 12.5 mg by mouth 3 (three) times daily. For migraine prevention    . CALCIUM CARBONATE-VITAMIN D PO Take 1 tablet by mouth daily. 1000 mg    . famotidine (PEPCID)  40 MG tablet Take 40 mg by mouth 2 (two) times daily.     Marland Kitchen glucose blood (TRUE METRIX BLOOD GLUCOSE TEST) test strip Use to check blood sugar two times a day. (dx. R73.03) 100 each 0  . hydrocortisone 2.5 % cream  Apply topically 2 (two) times daily as needed (Rash). (Patient taking differently: Apply 1 application topically daily as needed (Rash). ) 30 g 1  . hydrOXYzine (ATARAX/VISTARIL) 25 MG tablet Take 1 tablet (25 mg total) by mouth 3 (three) times daily as needed. (Patient taking differently: Take 31.25 mg by mouth 2 (two) times daily. ) 30 tablet 0  . ketorolac (TORADOL) 10 MG tablet Take 1 tablet (10 mg total) by mouth every 6 (six) hours as needed for moderate pain. (Patient taking differently: Take 2.5 mg by mouth 2 (two) times a week. ) 12 tablet 0  . Lancets (ACCU-CHEK MULTICLIX) lancets Use to check blood sugar two times a day. (dx.  R73.03) 102 each 0  . letrozole (FEMARA) 2.5 MG tablet TAKE 1 TABLET BY MOUTH EVERY DAY 90 tablet 3  . levocetirizine (XYZAL) 5 MG tablet Take 5 mg by mouth at bedtime.   3  . levothyroxine (SYNTHROID) 75 MCG tablet TAKE ONE TABLET BY MOUTH DAILY BEFORE BREAKFAST 30 tablet 5  . Magnesium Oxide 400 (240 Mg) MG TABS Take 400 mg by mouth daily.     . Multiple Vitamin (MULTIVITAMIN WITH MINERALS) TABS tablet Take 1 tablet by mouth daily.    . Omega-3 Fatty Acids (FISH OIL) 1000 MG CAPS Take 1,000 mg by mouth daily.     . OnabotulinumtoxinA (BOTOX IJ) Inject 1 Dose as directed every 6 (six) weeks.     . ondansetron (ZOFRAN ODT) 4 MG disintegrating tablet Take 1 tablet (4 mg total) by mouth every 8 (eight) hours as needed. 20 tablet 0  . pantoprazole (PROTONIX) 40 MG tablet TAKE ONE TABLET BY MOUTH ONE TIME DAILY (Patient taking differently: Take 40 mg by mouth daily. ) 90 tablet 0  . PRESCRIPTION MEDICATION Inject into the skin every 6 (six) weeks. Steroid Trigger    . Probiotic Product (PROBIOTIC-10 PO) Take 1 capsule by mouth daily.     . promethazine (PHENERGAN) 25 MG tablet Take 12.5 mg by mouth daily as needed for nausea or vomiting. When having a migraine    . rizatriptan (MAXALT) 10 MG tablet Take 1 tablet (10 mg total) by mouth once as needed for up to 10 days for  migraine. May repeat in 2 hours if needed 10 tablet 0  . TURMERIC PO Take 1 tablet by mouth daily.      No current facility-administered medications for this visit.    PHYSICAL EXAMINATION: ECOG PERFORMANCE STATUS: 1 - Symptomatic but completely ambulatory  Vitals:   10/09/20 1415  BP: 134/78  Pulse: 68  Resp: 18  Temp: 98.2 F (36.8 C)  SpO2: 100%   Filed Weights   10/09/20 1415  Weight: 132 lb 6.4 oz (60.1 kg)    BREAST: No palpable masses or nodules in either right or left breasts. No palpable axillary supraclavicular or infraclavicular adenopathy no breast tenderness or nipple discharge. (exam performed in the presence of a chaperone)  LABORATORY DATA:  I have reviewed the data as listed CMP Latest Ref Rng & Units 10/08/2020 09/05/2020 08/27/2020  Glucose 70 - 99 mg/dL 158(H) - 94  BUN 8 - 23 mg/dL 13 - 16  Creatinine 0.44 - 1.00 mg/dL 0.70 - 0.69  Sodium  135 - 145 mmol/L 132(L) - 134(L)  Potassium 3.5 - 5.1 mmol/L 4.2 - 3.5  Chloride 98 - 111 mmol/L 94(L) - 98  CO2 22 - 32 mmol/L 28 - 26  Calcium 8.9 - 10.3 mg/dL 9.3 - 9.2  Total Protein 6.5 - 8.1 g/dL 7.9 7.7 7.3  Total Bilirubin 0.3 - 1.2 mg/dL 0.6 0.4 0.8  Alkaline Phos 38 - 126 U/L 104 133(H) 116  AST 15 - 41 U/L _0 ALT 0 - 44 U/L _1 Lab Results  Component Value Date   WBC 8.7 10/08/2020   HGB 11.8 (L) 10/08/2020   HCT 38.0 10/08/2020   MCV 79.8 (L) 10/08/2020   PLT 357 10/08/2020   NEUTROABS 2.8 02/26/2020    ASSESSMENT & PLAN:  Malignant neoplasm of upper-inner quadrant of right breast in female, estrogen receptor positive ([place]) 10/10/2018:Right lumpectomy: IDC grade 1, 1.2 cm, with intermediate grade DCIS, margins negative, 0/2 lymph nodes negative, T1CN0 stage Ia ER 100%, PR 90%, Ki-67 10%, HER-2 1+ negative Oncotype DX: 29: 18% risk of recurrence  Treatment plan: 1.I recommended systemic adjuvant chemotherapy withTaxotere and Cytoxan every 3 weeks x4 cycles(patient  refused) 2. Adjuvant radiation therapy12/17/2019-12/05/2018 3. Adjuvant antiestrogen therapy with anastrozole started 12/05/2018 switched to letrozole 10/09/19  Anastrozole toxicities:myalgias We will switch to Letrozole  Extensive work-up for abdominal pain including CT abdomen pelvis and chest as well as MRCP: All these tests were normal.  Follows with gastroenterology. CT chest showing radiation l related changes in the lung.  There is no concern for metastatic disease.  Breast cancer surveillance: 1.  Mammogram 09/11/2020: Benign breast density category D 2.  Breast exam 10/08/2020: Benign 3.  CT abdomen 09/18/2019: Acute cystitis, 4.5 cm right adnexal cyst 4.  Bone density 08/24/2019: T score -2.2: Osteopenia  RTC in 1 year     No orders of the defined types were placed in this encounter.  The patient has a good understanding of the overall plan. she agrees with it. she will call with any problems that may develop before the next visit here.  Total time spent: 20 mins including face to face time and time spent for planning, charting and coordination of care  Nicholas Lose, MD 10/09/2020  I, Cloyde Reams Dorshimer, am acting as scribe for Dr. Nicholas Lose.  I have reviewed the above documentation for accuracy and completeness, and I agree with the above.

## 2020-10-08 NOTE — ED Notes (Signed)
Pt states her HA is relieved. DC reviewed with RN and provider. Denies questions or concerns at dc. AO x4.

## 2020-10-08 NOTE — Assessment & Plan Note (Signed)
10/10/2018:Right lumpectomy: IDC grade 1, 1.2 cm, with intermediate grade DCIS, margins negative, 0/2 lymph nodes negative, T1CN0 stage Ia ER 100%, PR 90%, Ki-67 10%, HER-2 1+ negative Oncotype DX: 29: 18% risk of recurrence  Treatment plan: 1.I recommended systemic adjuvant chemotherapy withTaxotere and Cytoxan every 3 weeks x4 cycles(patient refused) 2. Adjuvant radiation therapy12/17/2019-12/05/2018 3. Adjuvant antiestrogen therapy with anastrozole started 12/05/2018 switched to letrozole 10/09/19  Anastrozole toxicities:myalgias We will switch to Letrozole  Breast cancer surveillance: 1.  Mammogram 10/09/2019: Benign breast density category D 2.  Breast exam 10/08/2020: Benign 3.  CT abdomen 09/18/2019: Acute cystitis, 4.5 cm right adnexal cyst 4.  Bone density 08/24/2019: T score -2.2: Osteopenia  RTC in 1 year

## 2020-10-08 NOTE — ED Notes (Signed)
RN attempted to DC pt, however pt complains of 10/10 pain in L side of head with nausea returning. Dr. Alfred Levins made aware. Pt medicated per MAR.

## 2020-10-09 ENCOUNTER — Other Ambulatory Visit: Payer: Self-pay

## 2020-10-09 ENCOUNTER — Inpatient Hospital Stay: Payer: BC Managed Care – PPO | Attending: Hematology and Oncology | Admitting: Hematology and Oncology

## 2020-10-09 ENCOUNTER — Telehealth: Payer: Self-pay | Admitting: Hematology and Oncology

## 2020-10-09 DIAGNOSIS — Z79899 Other long term (current) drug therapy: Secondary | ICD-10-CM | POA: Insufficient documentation

## 2020-10-09 DIAGNOSIS — C50211 Malignant neoplasm of upper-inner quadrant of right female breast: Secondary | ICD-10-CM

## 2020-10-09 DIAGNOSIS — Z79811 Long term (current) use of aromatase inhibitors: Secondary | ICD-10-CM | POA: Insufficient documentation

## 2020-10-09 DIAGNOSIS — Z170 Estrogen receptor positive status [ER+]: Secondary | ICD-10-CM

## 2020-10-09 NOTE — Telephone Encounter (Signed)
Scheduled appts per 11/17 los. Gave pt a  Print out of AVS.

## 2020-10-16 ENCOUNTER — Telehealth: Payer: Self-pay

## 2020-10-16 NOTE — Telephone Encounter (Signed)
Per message from Dr. Ardis Hughs s/p EUS:  Please arrange for LFTs in 2 months. Include GGTP.  Pt has upcoming appt with Dr. Tarri Glenn to discuss gastric polyps. Uncertain if she would prefer pt to have these labs prior to her appt for review as well. Routing this message to Dr. Tarri Glenn for further instructions.

## 2020-10-16 NOTE — Telephone Encounter (Signed)
Prior would be ideal. But, she could have them drawn the day of the visit if she can't get here before then.

## 2020-10-21 ENCOUNTER — Telehealth: Payer: Self-pay | Admitting: Family Medicine

## 2020-10-21 ENCOUNTER — Encounter: Payer: Self-pay | Admitting: Family Medicine

## 2020-10-21 DIAGNOSIS — R7303 Prediabetes: Secondary | ICD-10-CM

## 2020-10-21 NOTE — Telephone Encounter (Signed)
I printed dme px to fax  Let her know insurance will not pay for bid checking since she does not have uncontrolled diabetes so I wrote for once daily

## 2020-10-21 NOTE — Telephone Encounter (Signed)
Pt called in wanted to get a one touch ultra 2 meter, and the test strips to be checked 2x daily , this is the meter that [employer] covers.   perferred pharmacy; [facility]

## 2020-10-21 NOTE — Telephone Encounter (Signed)
See mychart pt sent exactly what she needed

## 2020-10-21 NOTE — Telephone Encounter (Signed)
Order faxed and sent mychart message letting pt know

## 2020-10-22 DIAGNOSIS — J301 Allergic rhinitis due to pollen: Secondary | ICD-10-CM | POA: Diagnosis not present

## 2020-10-22 DIAGNOSIS — R21 Rash and other nonspecific skin eruption: Secondary | ICD-10-CM | POA: Diagnosis not present

## 2020-10-22 DIAGNOSIS — L298 Other pruritus: Secondary | ICD-10-CM | POA: Diagnosis not present

## 2020-10-22 MED ORDER — ONETOUCH ULTRA 2 W/DEVICE KIT: PACK | 0 refills | Status: DC

## 2020-10-22 MED ORDER — ONETOUCH ULTRA VI STRP: ORAL_STRIP | 3 refills | Status: DC

## 2020-10-22 MED ORDER — ONETOUCH ULTRASOFT LANCETS MISC: 3 refills | Status: DC

## 2020-10-22 NOTE — Addendum Note (Signed)
Addended by: Tammi Sou on: 10/22/2020 08:33 AM   Modules accepted: Orders

## 2020-10-23 ENCOUNTER — Ambulatory Visit (INDEPENDENT_AMBULATORY_CARE_PROVIDER_SITE_OTHER): Payer: BC Managed Care – PPO | Admitting: Dermatology

## 2020-10-23 ENCOUNTER — Other Ambulatory Visit: Payer: Self-pay

## 2020-10-23 DIAGNOSIS — L2084 Intrinsic (allergic) eczema: Secondary | ICD-10-CM | POA: Diagnosis not present

## 2020-10-23 DIAGNOSIS — L578 Other skin changes due to chronic exposure to nonionizing radiation: Secondary | ICD-10-CM

## 2020-10-23 DIAGNOSIS — L720 Epidermal cyst: Secondary | ICD-10-CM | POA: Diagnosis not present

## 2020-10-23 DIAGNOSIS — L814 Other melanin hyperpigmentation: Secondary | ICD-10-CM | POA: Diagnosis not present

## 2020-10-23 DIAGNOSIS — D1800 Hemangioma unspecified site: Secondary | ICD-10-CM

## 2020-10-23 DIAGNOSIS — Z1283 Encounter for screening for malignant neoplasm of skin: Secondary | ICD-10-CM

## 2020-10-23 DIAGNOSIS — D229 Melanocytic nevi, unspecified: Secondary | ICD-10-CM

## 2020-10-23 DIAGNOSIS — L821 Other seborrheic keratosis: Secondary | ICD-10-CM

## 2020-10-23 MED ORDER — HYDROCORTISONE 2.5 % EX CREA: TOPICAL_CREAM | CUTANEOUS | 3 refills | Status: AC

## 2020-10-23 MED ORDER — TRIAMCINOLONE ACETONIDE 0.1 % EX CREA: TOPICAL_CREAM | CUTANEOUS | 3 refills | Status: DC

## 2020-10-23 NOTE — Progress Notes (Signed)
Follow-Up Visit   Subjective  Jennifer [name] is a 64 y.o. female who presents for the following: Annual Exam (TBSE today. pt has a few areas that she would like looked at today on her face, left arm, and right leg. ). A small white spot at her left face is irritated.  She has itchy areas at the face, arm and right leg.   The following portions of the chart were reviewed this encounter and updated as appropriate:  Tobacco  Allergies  Meds  Problems  Med Hx  Surg Hx  Fam Hx     Review of Systems: No other skin or systemic complaints except as noted in HPI or Assessment and Plan.   Objective  Well appearing patient in no apparent distress; mood and affect are within normal limits.  A full examination was performed including scalp, head, eyes, ears, nose, lips, neck, chest, axillae, abdomen, back, buttocks, bilateral upper extremities, bilateral lower extremities, hands, feet, fingers, toes, fingernails, and toenails. All findings within normal limits unless otherwise noted below.  Objective  Left Upper Eyelid, left nasolabial fold: Smooth white papule, with surrounding erythema at left nasolabial fold  Objective  Right Thigh - Anterior: Stuck-on, waxy, tan-brown papules and plaques -- Discussed benign etiology and prognosis.   Objective  Left Antecubital Fossa: Thin scaly pink plaques  Assessment & Plan  Milia (2) left nasolabial fold; Left Upper Eyelid  Discussed removal of the milia at the left nasolabial fold due to irritation.   Acne/Milia surgery - left nasolabial fold Procedure risks and benefits were discussed with the patient and verbal consent was obtained. Following prep of the skin on the left nasolabial fold with an alcohol swab, extraction of milia was performed with a comedone extractor following superficial incision made over their surfaces with a #15 surgical blade. Capillary hemostasis was achieved with 20% aluminum chloride solution. Vaseline ointment was  applied to each site. The patient tolerated the procedure well.  Seborrheic keratosis Right Thigh - Anterior  Benign-appearing.  Observation.  Call clinic for new or changing lesions.  Recommend daily use of broad spectrum spf 30+ sunscreen to sun-exposed areas.   Discussed Ln2 treatment in the future if becomes bothersome.   Intrinsic (allergic) eczema Left Antecubital Fossa  Chronic condition with expected duration over one year. Condition is bothersome to patient. Currently flared.  Recommend gentle skin care to reduce flares.  Apply hydrocortisone 2.5% cream twice a day as needed to affected areas on face, underarms, groin  To affected areas on the body, use triamcinolone 0.1% ointment twice a day as needed for up to two weeks. Avoid applying to face, groin, and axilla. Use as directed. Risk of skin atrophy with long-term use reviewed.   Topical steroids (such as triamcinolone, fluocinolone, fluocinonide, mometasone, clobetasol, halobetasol, betamethasone, hydrocortisone) can cause thinning and lightening of the skin if they are used for too long in the same area. Your physician has selected the right strength medicine for your problem and area affected on the body. Please use your medication only as directed by your physician to prevent side effects.    hydrocortisone 2.5 % cream - Left Antecubital Fossa  triamcinolone (KENALOG) 0.1 % - Left Antecubital Fossa  Lentigines - Scattered tan macules - Discussed due to sun exposure - Benign, observe - Call for any changes  Seborrheic Keratoses - Stuck-on, waxy, tan-brown papules and plaques  - Discussed benign etiology and prognosis. - Observe - Call for any changes  Melanocytic Nevi - Tan-brown  and/or pink-flesh-colored symmetric macules and papules - Benign appearing on exam today - Observation - Call clinic for new or changing moles - Recommend daily use of broad spectrum spf 30+ sunscreen to sun-exposed areas.    Hemangiomas - Red papules - Discussed benign nature - Observe - Call for any changes  Actinic Damage - Chronic, secondary to cumulative UV/sun exposure - diffuse scaly erythematous macules with underlying dyspigmentation - Recommend daily broad spectrum sunscreen SPF 30+ to sun-exposed areas, reapply every 2 hours as needed.  - Call for new or changing lesions.  Skin cancer screening performed today.  Return in about 1 year (around 10/23/2021) for TBSE.   I, Harriett Sine, CMA, am acting as scribe for Forest Gleason, MD.  Documentation: I have reviewed the above documentation for accuracy and completeness, and I agree with the above.  Forest Gleason, MD

## 2020-10-23 NOTE — Patient Instructions (Addendum)
Apply rhofade each morning to red areas at face  "Homemade rhofade" recipe - Mix 1 bottle of Afrin original in 1 bottle of Cerave PM. Apply thin layer to red areas of face in the morning about 1 hour before leaving the house.  Recommend BBL light treatment for long-term, lasting improvement in blood vessels and red areas at face    Apply hydrocortisone 2.5% cream twice a day as needed to affected areas on face, underarms, groin  To affected areas on the body, use triamcinolone 0.1% ointment twice a day as needed. Avoid applying to face, groin, and axilla. Use as directed. Risk of skin atrophy with long-term use reviewed.   Topical steroids (such as triamcinolone, fluocinolone, fluocinonide, mometasone, clobetasol, halobetasol, betamethasone, hydrocortisone) can cause thinning and lightening of the skin if they are used for too long in the same area. Your physician has selected the right strength medicine for your problem and area affected on the body. Please use your medication only as directed by your physician to prevent side effects.    Gentle Skin Care Guide  1. Bathe no more than once a day.  2. Avoid bathing in hot water  3. Use a mild soap like Dove, Vanicream, Cetaphil, CeraVe. Can use Lever 2000 or Cetaphil antibacterial soap  4. Use soap only where you need it. On most days, use it under your arms, between your legs, and on your feet. Let the water rinse other areas unless visibly dirty.  5. When you get out of the bath/shower, use a towel to gently blot your skin dry, don't rub it.  6. While your skin is still a little damp, apply a moisturizing cream such as Vanicream, CeraVe, Cetaphil, Eucerin, Sarna lotion or plain Vaseline Jelly. For hands apply Neutrogena [place] Hand Cream or Excipial Hand Cream.  7. Reapply moisturizer any time you start to itch or feel dry.  8. Sometimes using free and clear laundry detergents can be helpful. Fabric softener sheets should be  avoided. Downy Free & Gentle liquid, or any liquid fabric softener that is free of dyes and perfumes, it acceptable to use  9. If your doctor has given you prescription creams you may apply moisturizers over them

## 2020-10-25 ENCOUNTER — Ambulatory Visit: Payer: BC Managed Care – PPO | Admitting: Gastroenterology

## 2020-10-28 ENCOUNTER — Ambulatory Visit (INDEPENDENT_AMBULATORY_CARE_PROVIDER_SITE_OTHER): Payer: BC Managed Care – PPO | Admitting: Gastroenterology

## 2020-10-28 ENCOUNTER — Telehealth: Payer: Self-pay | Admitting: Gastroenterology

## 2020-10-28 ENCOUNTER — Other Ambulatory Visit (INDEPENDENT_AMBULATORY_CARE_PROVIDER_SITE_OTHER): Payer: BC Managed Care – PPO

## 2020-10-28 ENCOUNTER — Encounter: Payer: Self-pay | Admitting: Gastroenterology

## 2020-10-28 VITALS — BP 112/68 | HR 72 | Ht 60.25 in | Wt 131.4 lb

## 2020-10-28 DIAGNOSIS — R935 Abnormal findings on diagnostic imaging of other abdominal regions, including retroperitoneum: Secondary | ICD-10-CM

## 2020-10-28 DIAGNOSIS — R748 Abnormal levels of other serum enzymes: Secondary | ICD-10-CM

## 2020-10-28 DIAGNOSIS — K219 Gastro-esophageal reflux disease without esophagitis: Secondary | ICD-10-CM | POA: Diagnosis not present

## 2020-10-28 DIAGNOSIS — K838 Other specified diseases of biliary tract: Secondary | ICD-10-CM | POA: Diagnosis not present

## 2020-10-28 LAB — HEPATIC FUNCTION PANEL
ALT: 16 U/L (ref 0–35)
AST: 16 U/L (ref 0–37)
Albumin: 4.4 g/dL (ref 3.5–5.2)
Alkaline Phosphatase: 123 U/L — ABNORMAL HIGH (ref 39–117)
Bilirubin, Direct: 0.1 mg/dL (ref 0.0–0.3)
Total Bilirubin: 0.4 mg/dL (ref 0.2–1.2)
Total Protein: 7.5 g/dL (ref 6.0–8.3)

## 2020-10-28 LAB — GAMMA GT: GGT: 18 U/L (ref 7–51)

## 2020-10-28 NOTE — Telephone Encounter (Signed)
See result note.  

## 2020-10-28 NOTE — Progress Notes (Signed)
Referring Provider: Abner Greenspan, MD Primary Care Physician:  Tower, [name], MD  Chief complaint:  Slightly dilated bile duct   IMPRESSION:  Abnormal bile duct on CT and MRI/MRCP with normal liver enzymes    - Normal CEA and Ca-19-9    - EGD with EUS with Dr. Ardis Hughs 09/12/20: Slightly dilated CBD (66m) without a clear anatomic cause Acute abdominal pain and back pain, now resolved Multiple fundic gland polyps on EGD Personal history of breast cancer Husband with bile duct cancer  Abnormal bile duct: No cause identified on EUS. Will follow her liver enzymes. Consider follow-up MRI/MRCP in 6 months.  Fundic gland polyps in the setting of pantoprazole use: Reviewed diagnosis and natural history.   Reflux with mild H pylori negative gastropathy on recent EGD: Symptoms are well controlled on medical therapy.  We reviewed  the short and long-term risks of PPI therapy including, but not limited to, potential increased risk of bone fractures, GI infections including C diff, pneumonia, B12 deficiency, hypomagnesemia, chronic kidney disease, and dementia. There is a risk in both taking the medicines as well as stopping the medicines. Therefore, we discussed continuing them at the lowest dose possible and only for as long as needed. Recommend annual creatinine level, every other year CBC, and B12 levels every 5 years if she continues on therapy. All questions were answered to the best of my ability.    PLAN: - Hepatic function test today - Continue calcium supplements - Follow-up PRN  Please see the "Patient Instructions" section for addition details about the plan.  HPI: LLiliahna Cuddis a 64y.o. female who returned in follow-up. [facility] showing mild reflux, H pylori negative gastritis, and fundic gland polyps. Treated with pantoprazole 40 mg daily and famotidine 20 gm BID. She had a colonoscopy at the same time showing  diverticulosis and hemorrhoids.  Surveillance colonoscopy recommended in 2023.   Evaluated in the ED 08/27/20 for nausea and upper abdominal discomfort after eating pizza. ED evaluation 08/27/20 included normal liver enzymes and normal lipase. Ultrasound showed a mildly dilated CBD without stones. The gallbladder appeared normal. CT abd/pelvis showed moderate amount of stool and mild common bile duct dilatation to the level of the ampulla. An MRI/MRCP showed mild intrahepatic and common bile duct dilatation to the level of the distal common bile duct/ampulla where there is a focal area of narrowing and multiple intrahepatic cysts. There was also a 720mhemangioma in the right liver lobe.  The radiologist suggested that this could be due to a focal area of stricturing. No choledocholithiasis is seen.  Given her history of breast cancer she was extremely concerned about these findings. Her husband died of bile duct cancer 7 years ago and this had her even more concerned. Weight stable. Appetite is good.  No alcohol. Never smoker. No family history of pancreatic or biliary malignancy.   Evaluation included: Normal CEA and Ca-19-9 EGD with EUS with Dr. JaArdis Hughs0/21/21: - Mild, non-specific gastritis, biopsied to check for H. pylori. - Numerous gastric polyps, likely fundic gland. These were sampled with biopsy. - Normal major papilla. - Slightly dilated CBD (63m363mwithout a clear anatomic cause.  FINAL MICROSCOPIC DIAGNOSIS:  A. STOMACH, DISTAL, BODY/ANTRUM, BIOPSY:  - Gastric antral and oxyntic mucosa with mild nonspecific reactive  gastropathy  - Warthin Starry stain is negative for Helicobacter pylori  B. STOMACH, FUNDIC, POLYPECTOMY:  - Fundic gland polyp(s)  - Negative  for dysplasia   Labs 10/08/20: normal CMP with alk phos 104  She returns today in follow-up after her EUS. Has questions about the gastric polyps seen on EUS.  Requires PPI for reflux. Needs her H2Blocker for allergies. No new  GI complaints or concerns.   Past Medical History:  Diagnosis Date  . Allergic rhinitis   . Anxiety   . Arthritis    hands, knees  . Breast cancer ([place]) 2019   Right Breast Cancer  . Cancer ([place]) 09/2018   Breast CA new DX, right breast  . Cervical dysplasia   . Common bile duct dilation   . Endometriosis   . Esophageal reflux   . Family history of adverse reaction to anesthesia    younger sister anxiety for a dew days after  . Family history of breast cancer   . Family history of breast cancer   . Family history of prostate cancer   . Hepatic cyst 08/2020   multiple  . Hepatic hemangioma 08/2020   intrahepatic hemangioma  . Hiatal hernia   . Hypothyroid   . Migraine with aura, without mention of intractable migraine without mention of status migrainosus   . Osteopenia 08/2019   T score -2.2 FRAX 11% / 1.7%  . Personal history of radiation therapy 2019   Right Breast Cancer  . Pre-diabetes     Past Surgical History:  Procedure Laterality Date  . BIOPSY  09/12/2020   Procedure: BIOPSY;  Surgeon: [name], MD;  Location: [facility];  Service: Endoscopy;;  . BREAST EXCISIONAL BIOPSY Bilateral over 10 years ago   benign x 2   . BREAST LUMPECTOMY Right 10/10/2018  . BREAST LUMPECTOMY WITH RADIOACTIVE SEED AND SENTINEL LYMPH NODE BIOPSY Right 10/10/2018   Procedure: RIGHT BREAST LUMPECTOMY WITH RADIOACTIVE SEED AND RIGHT SENTINEL LYMPH NODE BIOPSY;  Surgeon: Jovita Kussmaul, MD;  Location: [facility];  Service: General;  Laterality: Right;  . BREAST SURGERY     Benign breast lump excised  . CERVICAL CONE BIOPSY  1985   severe dysplasia  . COLONOSCOPY  2005 and dec 2019   normal  . COLPOSCOPY    . endoscopy  10/2018  . ESOPHAGOGASTRODUODENOSCOPY (EGD) WITH PROPOFOL N/A 09/12/2020   Procedure: ESOPHAGOGASTRODUODENOSCOPY (EGD) WITH PROPOFOL;  Surgeon: [name], MD;  Location: [facility];  Service: Endoscopy;  Laterality: N/A;  . EUS N/A  09/12/2020   Procedure: UPPER ENDOSCOPIC ULTRASOUND (EUS) RADIAL;  Surgeon: [name], MD;  Location: [facility];  Service: Endoscopy;  Laterality: N/A;  . LAPAROSCOPIC ASSISTED VAGINAL HYSTERECTOMY    . LAPAROSCOPIC BILATERAL SALPINGO OOPHERECTOMY Bilateral 10/27/2019   Procedure: LAPAROSCOPIC BILATERAL SALPINGO OOPHORECTOMY WITH PERITONEAL WASHINGS;  Surgeon: Princess Bruins, MD;  Location: [facility];  Service: Gynecology;  Laterality: Bilateral;  request 1:00pm on Friday, Dec. 4th in [place] time held requests one hour OR time  . moles removed from upper back, face lft, 1 between breasts, upper leg left inside  10/18/2019   wearing small round bandaids on for 2 weeks  . ROTATOR CUFF REPAIR Bilateral 08/2008    Current Outpatient Medications  Medication Sig Dispense Refill  . ALPRAZolam (XANAX) 0.5 MG tablet TAKE 1/2 TO 1 TABLET BY MOUTH ONCE DAILY AS NEEDED FOR SLEEP/ANXIETY (Patient taking differently: Take 0.125 mg by mouth daily as needed for anxiety or sleep. ) 15 tablet 0  . atenolol (TENORMIN) 25 MG tablet Take 12.5 mg by mouth 3 (three) times  daily. For migraine prevention    . CALCIUM CARBONATE-VITAMIN D PO Take 1 tablet by mouth daily. 1000 mg    . famotidine (PEPCID) 40 MG tablet Take 40 mg by mouth 2 (two) times daily.     . hydrocortisone 2.5 % cream Apply topically 2 (two) times daily as needed (Rash). (Patient taking differently: Apply 1 application topically daily as needed (Rash). ) 30 g 1  . hydrocortisone 2.5 % cream Apply to affected areas BID PRN as directed. 30 g 3  . hydrOXYzine (ATARAX/VISTARIL) 25 MG tablet Take 1 tablet (25 mg total) by mouth 3 (three) times daily as needed. (Patient taking differently: Take 31.25 mg by mouth 2 (two) times daily. ) 30 tablet 0  . ketorolac (TORADOL) 10 MG tablet Take 1 tablet (10 mg total) by mouth every 6 (six) hours as needed for moderate pain. (Patient taking differently: Take 2.5 mg by  mouth 2 (two) times a week. ) 12 tablet 0  . letrozole (FEMARA) 2.5 MG tablet TAKE 1 TABLET BY MOUTH EVERY DAY 90 tablet 3  . levocetirizine (XYZAL) 5 MG tablet Take 5 mg by mouth at bedtime.   3  . levothyroxine (SYNTHROID) 75 MCG tablet TAKE ONE TABLET BY MOUTH DAILY BEFORE BREAKFAST 30 tablet 5  . Magnesium Oxide 400 (240 Mg) MG TABS Take 400 mg by mouth daily.     . Multiple Vitamin (MULTIVITAMIN WITH MINERALS) TABS tablet Take 1 tablet by mouth daily.    . Omega-3 Fatty Acids (FISH OIL) 1000 MG CAPS Take 1,000 mg by mouth daily.     . OnabotulinumtoxinA (BOTOX IJ) Inject 1 Dose as directed every 6 (six) weeks.     . ondansetron (ZOFRAN ODT) 4 MG disintegrating tablet Take 1 tablet (4 mg total) by mouth every 8 (eight) hours as needed. 20 tablet 0  . pantoprazole (PROTONIX) 40 MG tablet TAKE ONE TABLET BY MOUTH ONE TIME DAILY (Patient taking differently: Take 40 mg by mouth daily. ) 90 tablet 0  . PRESCRIPTION MEDICATION Inject into the skin every 6 (six) weeks. Steroid Trigger    . Probiotic Product (PROBIOTIC-10 PO) Take 1 capsule by mouth daily.     . promethazine (PHENERGAN) 25 MG tablet Take 12.5 mg by mouth daily as needed for nausea or vomiting. When having a migraine    . triamcinolone (KENALOG) 0.1 % Apply to affected areas BID PRN for up to two weeks at a time as directed. 45 g 3  . TURMERIC PO Take 1 tablet by mouth daily.     . rizatriptan (MAXALT) 10 MG tablet Take 1 tablet (10 mg total) by mouth once as needed for up to 10 days for migraine. May repeat in 2 hours if needed 10 tablet 0   No current facility-administered medications for this visit.    Allergies as of 10/28/2020 - Review Complete 10/28/2020  Allergen Reaction Noted  . Ciprofloxacin Other (See Comments) 09/21/2018  . Codeine phosphate Other (See Comments) 02/07/2007  . Decongestant [pseudoephedrine] Other (See Comments) 09/21/2018  . Flonase [fluticasone propionate] Other (See Comments) 11/13/2014  .  Hydrocodone Other (See Comments) 11/27/2008  . Reglan [metoclopramide] Other (See Comments) 09/23/2018  . Topamax [topiramate] Other (See Comments) 09/21/2018  . Benadryl [diphenhydramine hcl] Palpitations 10/18/2017  . Sulfa antibiotics Anxiety 09/05/2014    Family History  Problem Relation Age of Onset  . Breast cancer Mother 67  . Diabetes Mother   . Hypertension Father   . Heart disease Father   .  Osteoporosis Maternal Grandmother   . Diabetes Maternal Grandmother   . Hypertension Sister   . COPD Paternal Grandmother   . Heart disease Paternal Grandfather   . Breast cancer Cousin 19       pat first cousin  . Breast cancer Cousin        mat second cousin with breast cancer in her 61s  . Prostate cancer Other 9  . Thyroid cancer Cousin 9       pat first cousin  . Colon cancer Neg Hx   . Stomach cancer Neg Hx   . Esophageal cancer Neg Hx   . Pancreatic cancer Neg Hx     Social History   Socioeconomic History  . Marital status: Widowed    Spouse name: Not on file  . Number of children: Not on file  . Years of education: Not on file  . Highest education level: Not on file  Occupational History  . 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 Smoking status: Never Smoker  . Smokeless tobacco: Never Used  Vaping Use  . Vaping Use: Never used  Substance and Sexual Activity  . Alcohol use: No    Alcohol/week: 0.0 standard drinks  . Drug use: No  . Sexual activity: Not Currently    Birth control/protection: Post-menopausal    Comment: 1st intercourse 52 yo-5 partners  Other Topics Concern  . 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   Social Determinants of Health   Financial Resource Strain:   . Difficulty of Paying Living Expenses: Not on file  Food Insecurity:   . Worried About Charity fundraiser in the Last Year: Not on file  . Ran Out of Food in the Last Year: Not on  file  Transportation Needs:   . Lack of Transportation (Medical): Not on file  . Lack of Transportation (Non-Medical): Not on file  Physical Activity:   . Days of Exercise per Week: Not on file  . Minutes of Exercise per Session: Not on file  Stress:   . Feeling of Stress : Not on file  Social Connections:   . Frequency of Communication with Friends and Family: Not on file  . Frequency of Social Gatherings with Friends and Family: Not on file  . Attends Religious Services: Not on file  . Active Member of Clubs or Organizations: Not on file  . Attends Archivist Meetings: Not on file  . Marital Status: Not on file  Intimate Partner Violence:   . Fear of Current or Ex-Partner: Not on file  . Emotionally Abused: Not on file  . Physically Abused: Not on file  . Sexually Abused: Not on file    Physical Exam: General:   Alert,  well-nourished, pleasant and cooperative in NAD Head:  Normocephalic and atraumatic. Eyes:  Sclera clear, no icterus.   Conjunctiva pink. Abdomen:  Soft, nontender, nondistended, normal bowel sounds, no rebound or guarding. No hepatosplenomegaly.  No umbilical or inguinal lymphadenopathy.  Rectal:  Deferred  Msk:  Symmetrical. No boney deformities LAD: No inguinal or umbilical LAD Extremities:  No clubbing or edema. Neurologic:  Alert and  oriented x4;  grossly nonfocal Skin:  Intact without significant lesions or rashes. Psych:  Alert and cooperative. Normal mood and affect.     Leilanny Fluitt L. Tarri Glenn, MD, MPH 10/28/2020, 11:34 AM

## 2020-10-28 NOTE — Telephone Encounter (Signed)
Patient returning your call about lab results.

## 2020-10-28 NOTE — Patient Instructions (Addendum)
We discussed your fundic gland polyps and the pantoprazole, a PPI medication.  In general, PPIs are considered safe medicines. But they may make the gastrointestinal tract more susceptible to bacterial infections--including a serious diarrheal disease called Clostridium difficile--and may increase the long-term risk of hip fractures.   LABS: Your provider has requested that you go to the basement level for lab work before leaving today. Press "B" on the elevator. The lab is located at the first door on the left as you exit the elevator.  HEALTHCARE LAWS AND MY CHART RESULTS: Due to recent changes in healthcare laws, you may see the results of your imaging and laboratory studies on MyChart before your provider has had a chance to review them.  We understand that in some cases there may be results that are confusing or concerning to you. Not all laboratory results come back in the same time frame and the provider may be waiting for multiple results in order to interpret others.  Please give [place] 48 hours in order for your provider to thoroughly review all the results before contacting the office for clarification of your results.   If you are age 64 or younger, your body mass index should be between 19-25. Your Body mass index is 25.44 kg/m. If this is out of the aformentioned range listed, please consider follow up with your Primary Care Provider.   Thank you for trusting me with your gastrointestinal care!    Thornton Park, MD, MPH

## 2020-11-06 DIAGNOSIS — H11033 Double pterygium of eye, bilateral: Secondary | ICD-10-CM | POA: Diagnosis not present

## 2020-11-08 ENCOUNTER — Other Ambulatory Visit: Payer: Self-pay | Admitting: Family Medicine

## 2020-11-08 MED ORDER — ALPRAZOLAM 0.5 MG PO TABS
ORAL_TABLET | ORAL | 1 refills | Status: DC
Start: 2020-11-08 — End: 2021-04-29

## 2020-11-08 NOTE — Addendum Note (Signed)
Addended by: Loura Pardon A on: 11/08/2020 04:14 PM   Modules accepted: Orders

## 2020-11-08 NOTE — Telephone Encounter (Signed)
Name of Medication:Alprazolam 0.5 mg Name of Pharmacy:[facility] or Written Date and Quantity:# 15 on 06/25/20 Last Office Visit and Type:09/06/20 ER f/u Next Office Visit and Type:none scheduled

## 2020-11-08 NOTE — Addendum Note (Signed)
Addended by: Tammi Sou on: 11/08/2020 02:22 PM   Modules accepted: Orders

## 2020-11-08 NOTE — Telephone Encounter (Signed)
Patient called needing a prescription refill on the following: Alprazolam  [facility] [place]

## 2020-11-13 DIAGNOSIS — M76821 Posterior tibial tendinitis, right leg: Secondary | ICD-10-CM | POA: Diagnosis not present

## 2020-11-19 DIAGNOSIS — G43719 Chronic migraine without aura, intractable, without status migrainosus: Secondary | ICD-10-CM | POA: Diagnosis not present

## 2020-11-19 DIAGNOSIS — G43019 Migraine without aura, intractable, without status migrainosus: Secondary | ICD-10-CM | POA: Diagnosis not present

## 2020-11-19 DIAGNOSIS — M542 Cervicalgia: Secondary | ICD-10-CM | POA: Diagnosis not present

## 2020-11-20 ENCOUNTER — Encounter: Payer: Self-pay | Admitting: Dermatology

## 2020-11-22 ENCOUNTER — Other Ambulatory Visit: Payer: Self-pay | Admitting: Family Medicine

## 2020-11-25 ENCOUNTER — Encounter: Payer: BC Managed Care – PPO | Admitting: Obstetrics & Gynecology

## 2020-12-06 ENCOUNTER — Ambulatory Visit: Payer: BC Managed Care – PPO | Admitting: Obstetrics & Gynecology

## 2020-12-06 ENCOUNTER — Encounter: Payer: BC Managed Care – PPO | Admitting: Obstetrics & Gynecology

## 2020-12-10 DIAGNOSIS — Z03818 Encounter for observation for suspected exposure to other biological agents ruled out: Secondary | ICD-10-CM | POA: Diagnosis not present

## 2020-12-10 DIAGNOSIS — Z20822 Contact with and (suspected) exposure to covid-19: Secondary | ICD-10-CM | POA: Diagnosis not present

## 2020-12-12 DIAGNOSIS — G43019 Migraine without aura, intractable, without status migrainosus: Secondary | ICD-10-CM | POA: Diagnosis not present

## 2020-12-12 DIAGNOSIS — G43719 Chronic migraine without aura, intractable, without status migrainosus: Secondary | ICD-10-CM | POA: Diagnosis not present

## 2020-12-12 DIAGNOSIS — M542 Cervicalgia: Secondary | ICD-10-CM | POA: Diagnosis not present

## 2020-12-16 DIAGNOSIS — G43719 Chronic migraine without aura, intractable, without status migrainosus: Secondary | ICD-10-CM | POA: Diagnosis not present

## 2020-12-17 ENCOUNTER — Other Ambulatory Visit: Payer: Self-pay

## 2020-12-17 ENCOUNTER — Ambulatory Visit
Admission: RE | Admit: 2020-12-17 | Discharge: 2020-12-17 | Disposition: A | Discharge status: Home / Self Care | Payer: BC Managed Care – PPO | Source: Ambulatory Visit | Attending: Adult Health | Admitting: Adult Health

## 2020-12-17 DIAGNOSIS — N6489 Other specified disorders of breast: Secondary | ICD-10-CM | POA: Diagnosis not present

## 2020-12-17 DIAGNOSIS — R590 Localized enlarged lymph nodes: Secondary | ICD-10-CM | POA: Diagnosis not present

## 2020-12-17 DIAGNOSIS — R2232 Localized swelling, mass and lump, left upper limb: Secondary | ICD-10-CM

## 2020-12-31 DIAGNOSIS — G43019 Migraine without aura, intractable, without status migrainosus: Secondary | ICD-10-CM | POA: Diagnosis not present

## 2020-12-31 DIAGNOSIS — M542 Cervicalgia: Secondary | ICD-10-CM | POA: Diagnosis not present

## 2020-12-31 DIAGNOSIS — G43719 Chronic migraine without aura, intractable, without status migrainosus: Secondary | ICD-10-CM | POA: Diagnosis not present

## 2021-01-16 ENCOUNTER — Telehealth: Payer: Self-pay

## 2021-01-16 ENCOUNTER — Telehealth (INDEPENDENT_AMBULATORY_CARE_PROVIDER_SITE_OTHER): Payer: BC Managed Care – PPO | Admitting: Family Medicine

## 2021-01-16 DIAGNOSIS — R300 Dysuria: Secondary | ICD-10-CM

## 2021-01-16 MED ORDER — NITROFURANTOIN MONOHYD MACRO 100 MG PO CAPS: 100.0000 mg | ORAL_CAPSULE | Freq: Two times a day (BID) | ORAL | 0 refills | Status: DC

## 2021-01-16 NOTE — Patient Instructions (Addendum)
-  I sent the medication(s) we discussed to your pharmacy: Meds ordered this encounter  Medications  . nitrofurantoin, macrocrystal-monohydrate, (MACROBID) 100 MG capsule    Sig: Take 1 capsule (100 mg total) by mouth 2 (two) times daily.    Dispense:  14 capsule    Refill:  0   Drink plenty of water.  I hope you are feeling better soon!  Seek in person care promptly if your symptoms worsen, new concerns arise or you are not improving with treatment over the next 24 hours.  It was nice to meet you today. I help [facility] out with telemedicine visits on Tuesdays and Thursdays and am available for visits on those days. If you have any concerns or questions following this visit please schedule a follow up visit with your Primary Care doctor or seek care at a local urgent care clinic to avoid delays in care.

## 2021-01-16 NOTE — Progress Notes (Signed)
Virtual Visit via Telephone Note  I connected with Raul Del on 01/16/21 at 12:20 PM EST by telephone and verified that I am speaking with the correct person using two identifiers.   I discussed the limitations, risks, security and privacy concerns of performing an evaluation and management service by telephone and the availability of in person appointments. I also discussed with the patient that there may be a patient responsible charge related to this service. The patient expressed understanding and agreed to proceed.  Location patient: home, [place] Location provider: work or home office Participants present for the call: patient, provider Patient did not have a visit with me in the prior 7 days to address this/these issue(s).   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 I do not appreciate any SOB. Speech and thought processing are grossly intact. Patient reported vitals:  Assessment and Plan:  Dysuria  -we discussed possible serious and likely etiologies, options for evaluation and workup, limitations of telemedicine visit vs in person visit, treatment, treatment risks and precautions. Pt prefers to treat via telemedicine empirically rather than in person at this moment. Given back pain and blood  suggested inperson evaluation would be best, though suspect cystitis most likely. She opted for starting macrobid 100mg  bid, but agrees to seek prompt inperson care if worsening, new symptoms arise, or if is not improving with treatment over the next 24 hours. Advised of options for inperson care in case PCP office not available. Did let the patient know that I only do telemedicine shifts for [place] on Tuesdays and Thursdays and advised a follow up visit with PCP or at an [facility] if has further questions or concerns.   Follow Up Instructions:  I did not refer this patient for an OV with me in the next 24 hours for this/these issue(s).  I discussed the assessment and treatment plan with the patient. The patient was provided an opportunity to ask questions and all were answered. The patient agreed with the plan and demonstrated an understanding of the instructions.   I spent 15 minutes on the date of this visit in the care of this patient. See summary of tasks completed to properly care for this patient in the detailed notes above which also included counseling of above, review of PMH, medications, allergies, evaluation of the patient and ordering and/or  instructing patient on testing and care options.     Lucretia Kern, DO

## 2021-01-16 NOTE — Telephone Encounter (Signed)
Unable to reach pt by phone. Sending note to DR Glori Bickers and [place] CMA.

## 2021-01-16 NOTE — Telephone Encounter (Signed)
[facility] Night - Client TELEPHONE ADVICE RECORD [facility] Patient Name: Jennifer [name] Gender: Female DOB: 1956-07-03 Age: 65 Y 3 M 15 D Return Phone Number: [phone] (Primary) Address: [place] City/State/Zip: [place] [address] Client [facility] Night - Client Client Site [place] Physician Tower, Roque Lias - MD Contact Type Call Who Is Calling Patient / Member / Family / Caregiver Call Type Triage / Clinical Relationship To Patient Self Return Phone Number [phone] (Primary) Chief Complaint Back Pain - General Reason for Call Request to Schedule Office Appointment Initial Comment Caller states she is calling to see about scheduling appointment tomorrow. States she thinks she has a UTI. States nurse friend tested her urine and positive for leukocytes and blood. States she is having back pain and urinating a lot. Translation No Nurse Assessment Nurse: Hilary Hertz, RN, Louanne Belton Date/Time (Eastern Time): 01/15/2021 10:37:41 PM Confirm and document reason for call. If symptomatic, describe symptoms. ---Caller states she is calling to see about scheduling appointment tomorrow. States she thinks she has a UTI. States nurse friend tested her urine and positive for leukocytes and blood. States she is having lower back pain and soreness, urinary frequency and urgency, has weakness. Pt had some nausea, took Zofran. Denies pain or burning with urination, fever or any other sx. Sx started today around 2:30pm. Does the patient have any new or worsening symptoms? ---Yes Will a triage be completed? ---Yes Related visit to physician within the last 2 weeks? ---No Does the PT have any chronic conditions? (i.e. diabetes, asthma, this includes High risk factors for pregnancy, etc.) ---Yes List chronic conditions. ---migraines, pre DM, gerd, hypothyroidism, breast CA (remission) Is this a  behavioral health or substance abuse call? ---No Guidelines Guideline Title Affirmed Question Affirmed Notes Nurse Date/Time Eilene Ghazi Time) Urinary Symptoms Side (flank) or lower back pain present Hilary Hertz, RN, Louanne Belton 01/15/2021 10:41:22 PM Disp. Time Eilene Ghazi Time) Disposition Final User 01/15/2021 10:44:29 PM See PCP within 24 Hours Yes Hilary Hertz, RN, Louanne Belton PLEASE NOTE: All timestamps contained within this report are represented as [place] Standard Time. CONFIDENTIALTY NOTICE: This fax transmission is intended only for the addressee. It contains information that is legally privileged, confidential or otherwise protected from use or disclosure. If you are not the intended recipient, you are strictly prohibited from reviewing, disclosing, copying using or disseminating any of this information or taking any action in reliance on or regarding this information. If you have received this fax in error, please notify us immediately by telephone so that we can arrange for its return to [place]. Phone: [phone], Toll-Free: [phone], Fax: [phone] Page: 2 of 2 Call Id: [id number] [place] Disagree/Comply Comply Caller Understands Yes PreDisposition Call another nurse Care Advice Given Per Guideline SEE PCP WITHIN 24 HOURS: * IF OFFICE WILL BE OPEN: You need to be examined within the next 24 hours. Call your doctor (or NP/PA) when the office opens and make an appointment. CARE ADVICE given per Urinary Symptoms (Adult) guideline. * Fever occurs * Unable to urinate and bladder feels full * You become worse Comments User: Louanne Belton, Hilary Hertz, RN Date/Time Eilene Ghazi Time): 01/15/2021 10:41:13 PM Temp 97.6 temporal scan Referrals REFERRED TO PCP OFFICE

## 2021-01-16 NOTE — Telephone Encounter (Signed)
Pt has a virtual visit with Dr. Maudie Mercury today to address this

## 2021-01-17 ENCOUNTER — Other Ambulatory Visit: Payer: Self-pay

## 2021-01-17 ENCOUNTER — Ambulatory Visit (INDEPENDENT_AMBULATORY_CARE_PROVIDER_SITE_OTHER): Payer: BC Managed Care – PPO | Admitting: Obstetrics & Gynecology

## 2021-01-17 ENCOUNTER — Encounter: Payer: Self-pay | Admitting: Obstetrics & Gynecology

## 2021-01-17 VITALS — BP 126/82 | Ht 60.0 in | Wt 133.0 lb

## 2021-01-17 DIAGNOSIS — M8580 Other specified disorders of bone density and structure, unspecified site: Secondary | ICD-10-CM

## 2021-01-17 DIAGNOSIS — Z780 Asymptomatic menopausal state: Secondary | ICD-10-CM | POA: Diagnosis not present

## 2021-01-17 DIAGNOSIS — Z853 Personal history of malignant neoplasm of breast: Secondary | ICD-10-CM | POA: Diagnosis not present

## 2021-01-17 DIAGNOSIS — Z01419 Encounter for gynecological examination (general) (routine) without abnormal findings: Secondary | ICD-10-CM | POA: Diagnosis not present

## 2021-01-17 NOTE — Addendum Note (Signed)
Addended by: Thurnell Garbe A on: 01/17/2021 11:47 AM   Modules accepted: Orders

## 2021-01-17 NOTE — Progress Notes (Signed)
Jennifer [name] [birth year] [id number]   History:    65 y.o. G0   RP:  Established patient presenting for annual gyn exam   HPI: S/P LPS BSO/Peritoneal washings 10/27/2019.  Postmenopause on no HRT.  No PMB.  No pelvic pain.  H/O Rt Breast invasive Ductal Ca grade 1 with ER positive.  Rt breast lumpectomy 09/2018 with Radioactive seed.  On Letrozole 2.5 mg daily currently.  Remote h/o Cervical Conization per patient, pap normal thereafter.  BMI 25.97.  Health labs with Fam MD.  Jennifer [name] 2019.  BD Osteopenia 08/2019.   Past medical history,surgical history, family history and social history were all reviewed and documented in the EPIC chart.  Gynecologic History No LMP recorded. Patient has had a hysterectomy.  Obstetric History OB History  Gravida Para Term Preterm AB Living  0 0 0 0 0 0  SAB IAB Ectopic Multiple Live Births  0 0 0 0 0     ROS: A ROS was performed and pertinent positives and negatives are included in the history.  GENERAL: No fevers or chills. HEENT: No change in vision, no earache, sore throat or sinus congestion. NECK: No pain or stiffness. CARDIOVASCULAR: No chest pain or pressure. No palpitations. PULMONARY: No shortness of breath, cough or wheeze. GASTROINTESTINAL: No abdominal pain, nausea, vomiting or diarrhea, melena or bright red blood per rectum. GENITOURINARY: No urinary frequency, urgency, hesitancy or dysuria. MUSCULOSKELETAL: No joint or muscle pain, no back pain, no recent trauma. DERMATOLOGIC: No rash, no itching, no lesions. ENDOCRINE: No polyuria, polydipsia, no heat or cold intolerance. No recent change in weight. HEMATOLOGICAL: No anemia or easy bruising or bleeding. NEUROLOGIC: No headache, seizures, numbness, tingling or weakness. PSYCHIATRIC: No depression, no loss of interest in normal activity or change in sleep pattern.     Exam:   BP 126/82   Ht 5' (1.524 m)   Wt 133 lb (60.3 kg)   BMI 25.97 kg/m   Body mass index is 25.97  kg/m.  General appearance : Well developed well nourished female. No acute distress HEENT: Eyes: no retinal hemorrhage or exudates,  Neck supple, trachea midline, no carotid bruits, no thyroidmegaly Lungs: Clear to auscultation, no rhonchi or wheezes, or rib retractions  Heart: Regular rate and rhythm, no murmurs or gallops Breast:Examined in sitting and supine position were symmetrical in appearance, no palpable masses or tenderness,  no skin retraction, no nipple inversion, no nipple discharge, no skin discoloration, no axillary or supraclavicular lymphadenopathy Abdomen: no palpable masses or tenderness, no rebound or guarding body mass index 25.97.  Continue with fitness and healthy nutrition. Extremities: no edema or skin discoloration or tenderness  Pelvic: Vulva: Normal             Vagina: No gross lesions or discharge  Cervix: No gross lesions or discharge.  Pap reflex done.  Uterus  AV, normal size, shape and consistency, non-tender and mobile  Adnexa  Without masses or tenderness  Anus: Normal   Assessment/Plan:  65 y.o. female for annual exam   1. Encounter for routine gynecological examination with Papanicolaou smear of cervix Normal gynecologic exam.  Pap reflex done.  Breast exam normal.  Screening mammogram October 2021 was negative.  Colonoscopy 2019.  Health labs with family physician.  2. Postmenopause Well on no hormone replacement therapy.  No postmenopausal bleeding.  3. Osteopenia, unspecified location Osteopenia on last bone density October 2020.  We will repeat a bone density October 2022.  Continue with vitamin D supplements,  calcium intake of 1.5 g/day total and regular weightbearing physical activities. - DG Bone Density; Future  4. History of breast cancer History of right breast invasive ductal cancer grade 1 in November 2019.  On Letrozole.  Status post bilateral salpingo-oophorectomy December 2020.  Princess Bruins MD, 9:41 AM 01/17/2021

## 2021-01-21 ENCOUNTER — Telehealth: Payer: Self-pay | Admitting: *Deleted

## 2021-01-21 LAB — PAP IG W/ RFLX HPV ASCU

## 2021-01-21 NOTE — Telephone Encounter (Signed)
Go ahead and stop the macrobid (she has been on it more than 5 days)  If uti symptoms return please call

## 2021-01-21 NOTE — Telephone Encounter (Signed)
Patient left a voicemail stating that she did a virtual visit with Dr. Maudie Mercury on Thursday and was given Macrobid for a UTI. Patient stated since starting the Macrobid she hashad a migraine every day. Patient stated that she has had to take her migraine medication to help with the headaches. Patient stated that she is having to take a Prednisone taper dose because of the headaches. Patient stated that she thinks that she should stop the Macrobid and wants to know what Dr. Glori Bickers thinks. Patient stated that she can stop it and see her she does with the UTI. Patient stated that she is feeling better. Patient stated that she is out of town in [place] now.

## 2021-01-21 NOTE — Telephone Encounter (Signed)
Pt notified of Dr. Tower's comments and verbalized understanding  

## 2021-01-22 MED ORDER — CEPHALEXIN 500 MG PO CAPS: 500.0000 mg | ORAL_CAPSULE | Freq: Two times a day (BID) | ORAL | 0 refills | Status: DC

## 2021-01-22 NOTE — Telephone Encounter (Signed)
Pt notified of Dr. Tower's comments and Rx sent to pharmacy  

## 2021-01-22 NOTE — Telephone Encounter (Signed)
Patient called in and stated she is starting to feel the symptoms again. Stated she may make follow up when back in town, but wondering if pcp can call anything in in the meantime. Please advise.

## 2021-01-22 NOTE — Addendum Note (Signed)
Addended by: Loura Pardon A on: 01/22/2021 01:02 PM   Modules accepted: Orders

## 2021-01-22 NOTE — Telephone Encounter (Signed)
I pended keflex to send to pharmacy of choice  Thanks  Let us know if this does not help

## 2021-01-22 NOTE — Addendum Note (Signed)
Addended by: Tammi Sou on: 01/22/2021 02:07 PM   Modules accepted: Orders

## 2021-01-24 ENCOUNTER — Ambulatory Visit: Payer: BC Managed Care – PPO | Admitting: Obstetrics & Gynecology

## 2021-01-24 ENCOUNTER — Encounter: Payer: Self-pay | Admitting: *Deleted

## 2021-01-28 DIAGNOSIS — G43719 Chronic migraine without aura, intractable, without status migrainosus: Secondary | ICD-10-CM | POA: Diagnosis not present

## 2021-01-28 DIAGNOSIS — M542 Cervicalgia: Secondary | ICD-10-CM | POA: Diagnosis not present

## 2021-01-28 DIAGNOSIS — G43019 Migraine without aura, intractable, without status migrainosus: Secondary | ICD-10-CM | POA: Diagnosis not present

## 2021-01-30 ENCOUNTER — Ambulatory Visit: Payer: BC Managed Care – PPO | Admitting: Family Medicine

## 2021-01-31 ENCOUNTER — Ambulatory Visit (INDEPENDENT_AMBULATORY_CARE_PROVIDER_SITE_OTHER): Payer: BC Managed Care – PPO | Admitting: Primary Care

## 2021-01-31 ENCOUNTER — Other Ambulatory Visit: Payer: Self-pay

## 2021-01-31 VITALS — BP 122/78 | HR 66 | Temp 98.6°F | Ht 60.0 in | Wt 135.0 lb

## 2021-01-31 DIAGNOSIS — R300 Dysuria: Secondary | ICD-10-CM

## 2021-01-31 LAB — POC URINALSYSI DIPSTICK (AUTOMATED)
Bilirubin, UA: NEGATIVE
Blood, UA: NEGATIVE
Glucose, UA: NEGATIVE
Ketones, UA: NEGATIVE
Leukocytes, UA: NEGATIVE
Nitrite, UA: NEGATIVE
Protein, UA: NEGATIVE
Spec Grav, UA: 1.01 (ref 1.010–1.025)
Urobilinogen, UA: 0.2 E.U./dL
pH, UA: 6 (ref 5.0–8.0)

## 2021-01-31 NOTE — Patient Instructions (Signed)
Your urine test appears negative today.  We will be in touch once we receive your urine culture results.  It was a pleasure meeting you!

## 2021-01-31 NOTE — Assessment & Plan Note (Signed)
With other urinary symptoms last week. Symptoms have completely abated.  UA today negative. Will send culture to verify.  Discussed to refrain from taking cephalexin antibiotics. Will be in touch regarding urine culture results.

## 2021-01-31 NOTE — Progress Notes (Signed)
Subjective:    Patient ID: Jennifer [name], female    DOB: 13-Jun-1956, 65 y.o.   MRN: [id number]  HPI  Jennifer [name] is a very pleasant 65 y.o. female patient of Dr. Glori Bickers with a history of cystitis with hematuria, prediabetes, adnexal cyst who presents today   She was evaluated by Dr. Maudie Mercury virtually on 01/16/21 for symptoms of urinary frequency, urgency, low back pain, hematuria. She was treated empirically with Macrobid 100 mg BID, no urine was collected.   Patient called on 01/21/21, endorsed migraine headaches daily since starting Macrobid for presumed UTI. Dr. Glori Bickers recommended patient stop Macrobid and to call back if symptoms returned. Patient called back the following day endorsing that symptoms had returned so a prescription for cephalexin 500 mg BID x 7 days was sent to [facility] on 01/22/21.  Today she endorses that her symptoms have overall decreased, never picked up the cephalexin as prescribed. She comes in with a UA result from 01/15/21 that was collected by a nurse at work. Urine showed 3+ leuks, 2+ blood, otherwise negative. No culture was collected.  She is questioning whether or not she should proceed with cephalexin given that her symptoms have completely resolved.   Review of Systems  Respiratory: Negative for shortness of breath.   Genitourinary: Negative for dysuria, hematuria and urgency.         Past Medical History:  Diagnosis Date  . Allergic rhinitis   . Anxiety   . Arthritis    hands, knees  . Breast cancer ([place]) 2019   Right Breast Cancer  . Cancer ([place]) 09/2018   Breast CA new DX, right breast  . Cervical dysplasia   . Common bile duct dilation   . Endometriosis   . Esophageal reflux   . Family history of adverse reaction to anesthesia    younger sister anxiety for a dew days after  . Family history of breast cancer   . Family history of breast cancer   . Family history of prostate cancer   . Hepatic cyst 08/2020   multiple  . Hepatic hemangioma 08/2020    intrahepatic hemangioma  . Hiatal hernia   . Hypothyroid   . Migraine with aura, without mention of intractable migraine without mention of status migrainosus   . Osteopenia 08/2019   T score -2.2 FRAX 11% / 1.7%  . Personal history of radiation therapy 2019   Right Breast Cancer  . Pre-diabetes     Social History   Socioeconomic History  . Marital status: Widowed    Spouse name: Not on file  . Number of children: Not on file  . Years of education: Not on file  . Highest education level: Not on file  Occupational History  . 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 Smoking status: Never Smoker  . Smokeless tobacco: Never Used  Vaping Use  . Vaping Use: Never used  Substance and Sexual Activity  . Alcohol use: No    Alcohol/week: 0.0 standard drinks  . Drug use: No  . Sexual activity: Not Currently    Birth control/protection: Post-menopausal    Comment: 1st intercourse 69 yo-5 partners  Other Topics Concern  . 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   Social Determinants of Health   Financial Resource Strain: Not on file  Food Insecurity: Not on file  Transportation Needs: Not on file  Physical Activity: Not  on file  Stress: Not on file  Social Connections: Not on file  Intimate Partner Violence: Not on file    Past Surgical History:  Procedure Laterality Date  . BIOPSY  09/12/2020   Procedure: BIOPSY;  Surgeon: [name], MD;  Location: [facility];  Service: Endoscopy;;  . BREAST EXCISIONAL BIOPSY Bilateral over 10 years ago   benign x 2   . BREAST LUMPECTOMY Right 10/10/2018  . BREAST LUMPECTOMY WITH RADIOACTIVE SEED AND SENTINEL LYMPH NODE BIOPSY Right 10/10/2018   Procedure: RIGHT BREAST LUMPECTOMY WITH RADIOACTIVE SEED AND RIGHT SENTINEL LYMPH NODE BIOPSY;  Surgeon: Jovita Kussmaul, MD;  Location: [facility];  Service: General;  Laterality:  Right;  . BREAST SURGERY     Benign breast lump excised  . CERVICAL CONE BIOPSY  1985   severe dysplasia  . COLONOSCOPY  2005 and dec 2019   normal  . COLPOSCOPY    . endoscopy  10/2018  . ESOPHAGOGASTRODUODENOSCOPY (EGD) WITH PROPOFOL N/A 09/12/2020   Procedure: ESOPHAGOGASTRODUODENOSCOPY (EGD) WITH PROPOFOL;  Surgeon: [name], MD;  Location: [facility];  Service: Endoscopy;  Laterality: N/A;  . EUS N/A 09/12/2020   Procedure: UPPER ENDOSCOPIC ULTRASOUND (EUS) RADIAL;  Surgeon: [name], MD;  Location: [facility];  Service: Endoscopy;  Laterality: N/A;  . LAPAROSCOPIC ASSISTED VAGINAL HYSTERECTOMY    . LAPAROSCOPIC BILATERAL SALPINGO OOPHERECTOMY Bilateral 10/27/2019   Procedure: LAPAROSCOPIC BILATERAL SALPINGO OOPHORECTOMY WITH PERITONEAL WASHINGS;  Surgeon: Princess Bruins, MD;  Location: [facility];  Service: Gynecology;  Laterality: Bilateral;  request 1:00pm on Friday, Dec. 4th in [place] time held requests one hour OR time  . moles removed from upper back, face lft, 1 between breasts, upper leg left inside  10/18/2019   wearing small round bandaids on for 2 weeks  . ROTATOR CUFF REPAIR Bilateral 08/2008    Family History  Problem Relation Age of Onset  . Breast cancer Mother 32  . Diabetes Mother   . Hypertension Father   . Heart disease Father   . Osteoporosis Maternal Grandmother   . Diabetes Maternal Grandmother   . Hypertension Sister   . COPD Paternal Grandmother   . Heart disease Paternal Grandfather   . Breast cancer Cousin 51       pat first cousin  . Breast cancer Cousin        mat second cousin with breast cancer in her 109s  . Prostate cancer Other 46  . Thyroid cancer Cousin 68       pat first cousin  . Colon cancer Neg Hx   . Stomach cancer Neg Hx   . Esophageal cancer Neg Hx   . Pancreatic cancer Neg Hx     Allergies  Allergen Reactions  . Ciprofloxacin Other (See Comments)    Dizziness   .  Codeine Phosphate Other (See Comments)    Dizziness, heart palpitations, nervous  . Decongestant [Pseudoephedrine] Other (See Comments)    dizziness  . Flonase [Fluticasone Propionate] Other (See Comments)    Worsens her migraine   . Hydrocodone Other (See Comments)    Made nervous--10/09 surgery rotator cuff  . Macrobid [Nitrofurantoin]     headaches  . Reglan [Metoclopramide] Other (See Comments)    Dizziness  . Topamax [Topiramate] Other (See Comments)    Dizziness   . Benadryl [Diphenhydramine Hcl] Palpitations  . Sulfa Antibiotics Anxiety    Current Outpatient Medications on File Prior to Visit  Medication Sig  Dispense Refill  . ALPRAZolam (XANAX) 0.5 MG tablet TAKE 1/2 TO 1 TABLET BY MOUTH ONCE DAILY AS NEEDED FOR SLEEP/ANXIETY 15 tablet 1  . atenolol (TENORMIN) 25 MG tablet Take 12.5 mg by mouth 3 (three) times daily. For migraine prevention    . CALCIUM CARBONATE-VITAMIN D PO Take 1 tablet by mouth daily. 1000 mg    . famotidine (PEPCID) 40 MG tablet Take 40 mg by mouth 2 (two) times daily.     . hydrocortisone 2.5 % cream Apply topically 2 (two) times daily as needed (Rash). (Patient taking differently: Apply 1 application topically daily as needed (Rash).) 30 g 1  . hydrocortisone 2.5 % cream Apply to affected areas BID PRN as directed. 30 g 3  . hydrOXYzine (ATARAX/VISTARIL) 25 MG tablet Take 1 tablet (25 mg total) by mouth 3 (three) times daily as needed. (Patient taking differently: Take 31.25 mg by mouth 2 (two) times daily.) 30 tablet 0  . ketorolac (TORADOL) 10 MG tablet Take 1 tablet (10 mg total) by mouth every 6 (six) hours as needed for moderate pain. (Patient taking differently: Take 2.5 mg by mouth 2 (two) times a week.) 12 tablet 0  . letrozole (FEMARA) 2.5 MG tablet TAKE 1 TABLET BY MOUTH EVERY DAY 90 tablet 3  . levocetirizine (XYZAL) 5 MG tablet Take 5 mg by mouth at bedtime.   3  . levothyroxine (SYNTHROID) 75 MCG tablet TAKE ONE TABLET BY MOUTH DAILY BEFORE  BREAKFAST 30 tablet 5  . Magnesium Oxide 400 (240 Mg) MG TABS Take 400 mg by mouth daily.     . Multiple Vitamin (MULTIVITAMIN WITH MINERALS) TABS tablet Take 1 tablet by mouth daily.    . Omega-3 Fatty Acids (FISH OIL) 1000 MG CAPS Take 1,000 mg by mouth daily.     . OnabotulinumtoxinA (BOTOX IJ) Inject 1 Dose as directed every 6 (six) weeks.     . ondansetron (ZOFRAN ODT) 4 MG disintegrating tablet Take 1 tablet (4 mg total) by mouth every 8 (eight) hours as needed. 20 tablet 0  . pantoprazole (PROTONIX) 40 MG tablet TAKE ONE TABLET BY MOUTH ONE TIME DAILY 90 tablet 1  . PRESCRIPTION MEDICATION Inject into the skin every 6 (six) weeks. Steroid Trigger    . Probiotic Product (PROBIOTIC-10 PO) Take 1 capsule by mouth daily.     . promethazine (PHENERGAN) 25 MG tablet Take 12.5 mg by mouth daily as needed for nausea or vomiting. When having a migraine    . triamcinolone (KENALOG) 0.1 % Apply to affected areas BID PRN for up to two weeks at a time as directed. 45 g 3  . TURMERIC PO Take 1 tablet by mouth daily.     . cephALEXin (KEFLEX) 500 MG capsule Take 1 capsule (500 mg total) by mouth 2 (two) times daily. (Patient not taking: Reported on 01/31/2021) 14 capsule 0  . rizatriptan (MAXALT) 10 MG tablet Take 1 tablet (10 mg total) by mouth once as needed for up to 10 days for migraine. May repeat in 2 hours if needed 10 tablet 0   No current facility-administered medications on file prior to visit.    BP 122/78   Pulse 66   Temp 98.6 F (37 C) (Temporal)   Ht 5' (1.524 m)   Wt 135 lb (61.2 kg)   SpO2 100%   BMI 26.37 kg/m  Objective:   Physical Exam Constitutional:      General: She is not in acute distress. Cardiovascular:  Rate and Rhythm: Normal rate and regular rhythm.  Pulmonary:     Effort: Pulmonary effort is normal.     Breath sounds: Normal breath sounds.  Abdominal:     Tenderness: There is no right CVA tenderness or left CVA tenderness.  Musculoskeletal:     Cervical  back: Neck supple.  Skin:    General: Skin is warm and dry.  Neurological:     Mental Status: She is alert.           Assessment & Plan:      This visit occurred during the SARS-CoV-2 public health emergency.  Safety protocols were in place, including screening questions prior to the visit, additional usage of staff PPE, and extensive cleaning of exam room while observing appropriate contact time as indicated for disinfecting solutions.

## 2021-02-01 LAB — URINE CULTURE
MICRO NUMBER:: 11637693
Result:: NO GROWTH
SPECIMEN QUALITY:: ADEQUATE

## 2021-03-10 DIAGNOSIS — G43719 Chronic migraine without aura, intractable, without status migrainosus: Secondary | ICD-10-CM | POA: Diagnosis not present

## 2021-03-18 ENCOUNTER — Ambulatory Visit: Payer: BC Managed Care – PPO | Admitting: Internal Medicine

## 2021-03-18 ENCOUNTER — Other Ambulatory Visit: Payer: Self-pay | Admitting: *Deleted

## 2021-03-18 DIAGNOSIS — G43019 Migraine without aura, intractable, without status migrainosus: Secondary | ICD-10-CM | POA: Diagnosis not present

## 2021-03-18 DIAGNOSIS — C50211 Malignant neoplasm of upper-inner quadrant of right female breast: Secondary | ICD-10-CM

## 2021-03-18 DIAGNOSIS — M542 Cervicalgia: Secondary | ICD-10-CM | POA: Diagnosis not present

## 2021-03-18 DIAGNOSIS — G43719 Chronic migraine without aura, intractable, without status migrainosus: Secondary | ICD-10-CM | POA: Diagnosis not present

## 2021-03-18 DIAGNOSIS — Z170 Estrogen receptor positive status [ER+]: Secondary | ICD-10-CM

## 2021-03-18 NOTE — Progress Notes (Signed)
Received call from pt with complaint of right arm swelling.  Pt denies redness, streaking or warmth to the touch.  Pt states she recently has been helping her sister move and has lifted/pushed heavy boxes.  Per MD pt needing to be evaluated by lymphedema clinic.  Referral placed and pt verbalized understanding.

## 2021-03-19 ENCOUNTER — Telehealth: Payer: Self-pay | Admitting: *Deleted

## 2021-03-19 ENCOUNTER — Telehealth: Payer: Self-pay

## 2021-03-19 ENCOUNTER — Encounter: Payer: Self-pay | Admitting: Family Medicine

## 2021-03-19 NOTE — Telephone Encounter (Signed)
Pt reports she has been out of town but has been experiencing swelling bilateral LE and right arm has had increase in physical movement in the past week... No available appointments, denies cardiac Sx and pt can not be seen at 11am on Fri [place] slot as she has Oncology f/u and PT... please advise

## 2021-03-19 NOTE — Telephone Encounter (Signed)
Pt notified of Dr. Marliss Coots comments. Pt became upset and said she is very concern about her swelling and felt like her PCP is just "putting her off" on the hospital and they probably wouldn't do much there. She said anything could happen in 2 days and she didn't feel comfortable waiting to see oncologist on Friday incase they just turn around and say this is a PCP issue. Pt wanted to be seen by someone in [place] even if it's another office. Talbert Forest, NP at [facility] had an 8am tomorrow and pt agreed to go there. appt scheduled and routed to PCP as an [place]

## 2021-03-19 NOTE — Telephone Encounter (Signed)
Pt called with extreme anxiety regarding right arm lymphedema.  Pt requesting to be seen by MD for physical examination. Apt scheduled and pt verbalized understanding of date and time.

## 2021-03-19 NOTE — Telephone Encounter (Signed)
She could try and address with oncology at her appt when she is there-that is what I would suggest first. If worse or if any sob/cp of course go to ER  Let me know if fever/redness or other symptoms

## 2021-03-20 ENCOUNTER — Encounter: Payer: Self-pay | Admitting: Adult Health

## 2021-03-20 ENCOUNTER — Ambulatory Visit (INDEPENDENT_AMBULATORY_CARE_PROVIDER_SITE_OTHER): Payer: BC Managed Care – PPO | Admitting: Adult Health

## 2021-03-20 ENCOUNTER — Other Ambulatory Visit: Payer: Self-pay

## 2021-03-20 VITALS — BP 124/62 | HR 61 | Temp 97.7°F | Wt 136.2 lb

## 2021-03-20 DIAGNOSIS — S46911A Strain of unspecified muscle, fascia and tendon at shoulder and upper arm level, right arm, initial encounter: Secondary | ICD-10-CM

## 2021-03-20 DIAGNOSIS — T1490XA Injury, unspecified, initial encounter: Secondary | ICD-10-CM

## 2021-03-20 NOTE — Progress Notes (Signed)
Patient Care Team: Tower, [name], MD as PCP - General Nicholas Lose, MD as Consulting Physician (Hematology and Oncology) Kyung Rudd, MD as Consulting Physician (Radiation Oncology) Jovita Kussmaul, MD as Consulting Physician (General Surgery)  DIAGNOSIS:    ICD-10-CM   1. Malignant neoplasm of upper-inner quadrant of right breast in female, estrogen receptor positive ([place])  C50.211    Z17.0     SUMMARY OF ONCOLOGIC HISTORY: Oncology History  Malignant neoplasm of upper-inner quadrant of right breast in female, estrogen receptor positive ([place])  09/13/2018 Initial Diagnosis   Diagnostic mammogram detected right breast mass with distortion LIQ at 2:00 9 cm from nipple 1.7 cm, at 1:00 there was a benign 1.2 cm fibrocystic change, biopsy of the 2:00 mass revealed grade 1 IDC with DCIS ER 100%, PR 90%, Ki-67 10%, HER-2 1+ negative, T1CN0 stage Ia clinical stage   10/10/2018 Surgery   Right lumpectomy: IDC grade 1, 1.2 cm, with intermediate grade DCIS, margins negative, 0/2 lymph nodes negative, T1CN0 stage Ia ER 100%, PR 90%, Ki-67 10%, HER-2 1+ negative    10/10/2018 Oncotype testing   oncotype results of 29: Risk of recurrence without chemo 18%   11/07/2018 - 12/08/2018 Radiation Therapy   1. Right Breast / 42.56 Gy in 16 fractions 2. Right Breast Boost / 8 Gy in 4 fractions - Total dose 50.56 Gy   12/2018 -  Anti-estrogen oral therapy   Anastrozole daily, planned for 7 years   11/11/2019 Genetic Testing   Negative genetic testing on the common hereditary cancer panel.  The Common Hereditary Gene Panel offered by [facility] includes sequencing and/or deletion duplication testing of the following 48 genes: APC, ATM, AXIN2, BARD1, BMPR1A, BRCA1, BRCA2, BRIP1, CDH1, CDK4, CDKN2A (p14ARF), CDKN2A (p16INK4a), CHEK2, CTNNA1, DICER1, EPCAM (Deletion/duplication testing only), GREM1 (promoter region deletion/duplication testing only), KIT, MEN1, MLH1, MSH2, MSH3, MSH6, MUTYH, NBN, NF1, NHTL1,  PALB2, PDGFRA, PMS2, POLD1, POLE, PTEN, RAD50, RAD51C, RAD51D, RNF43, SDHB, SDHC, SDHD, SMAD4, SMARCA4. STK11, TP53, TSC1, TSC2, and VHL.  The following genes were evaluated for sequence changes only: SDHA and HOXB13 c.251G>A variant only. The report date is 11/11/2019     CHIEF COMPLIANT: Follow-up of right breast cancer on letrozole, concern for lymphedema  INTERVAL HISTORY: Jennifer [name] is a 65 y.o. with above-mentioned history of right breast cancerwhounderwent lumpectomy,radiation, and is currently on anastrozole. Mammogram on 09/11/20 showed an enlarged left axillary lymph node, likely due to a recent COVID vaccination.Left axilla [place] on 12/17/20 showed resolution of thickened nodes in the left axilla. She presents to the clinic todayfor follow-up.  Recently she was in [place] helping her mother and her sister.  She was helping her sister with the house remodeling work and was lifting and moving lots of heavy stuff and that started to notice significant discomfort in her joints especially her knees as well as her arms and elbows.  She is not really worried that she may have been developing lymphedema and wanted to see [place] for evaluation.  ALLERGIES:  is allergic to ciprofloxacin, codeine phosphate, decongestant [pseudoephedrine], flonase [fluticasone propionate], hydrocodone, macrobid [nitrofurantoin], reglan [metoclopramide], topamax [topiramate], benadryl [diphenhydramine hcl], and sulfa antibiotics.  MEDICATIONS:  Current Outpatient Medications  Medication Sig Dispense Refill  . ALPRAZolam (XANAX) 0.5 MG tablet TAKE 1/2 TO 1 TABLET BY MOUTH ONCE DAILY AS NEEDED FOR SLEEP/ANXIETY 15 tablet 1  . atenolol (TENORMIN) 25 MG tablet Take 12.5 mg by mouth 3 (three) times daily. For migraine prevention    . CALCIUM  CARBONATE-VITAMIN D PO Take 1 tablet by mouth daily. 1000 mg    . cephALEXin (KEFLEX) 500 MG capsule Take 1 capsule (500 mg total) by mouth 2 (two) times daily. 14 capsule 0  .  famotidine (PEPCID) 40 MG tablet Take 40 mg by mouth 2 (two) times daily.     . hydrocortisone 2.5 % cream Apply topically 2 (two) times daily as needed (Rash). (Patient taking differently: Apply 1 application topically daily as needed (Rash).) 30 g 1  . hydrocortisone 2.5 % cream Apply to affected areas BID PRN as directed. 30 g 3  . hydrOXYzine (ATARAX/VISTARIL) 25 MG tablet Take 1 tablet (25 mg total) by mouth 3 (three) times daily as needed. (Patient taking differently: Take 31.25 mg by mouth 2 (two) times daily.) 30 tablet 0  . ketorolac (TORADOL) 10 MG tablet Take 1 tablet (10 mg total) by mouth every 6 (six) hours as needed for moderate pain. (Patient taking differently: Take 2.5 mg by mouth 2 (two) times a week.) 12 tablet 0  . letrozole (FEMARA) 2.5 MG tablet TAKE 1 TABLET BY MOUTH EVERY DAY 90 tablet 3  . levocetirizine (XYZAL) 5 MG tablet Take 5 mg by mouth at bedtime.   3  . levothyroxine (SYNTHROID) 75 MCG tablet TAKE ONE TABLET BY MOUTH DAILY BEFORE BREAKFAST 30 tablet 5  . Magnesium Oxide 400 (240 Mg) MG TABS Take 400 mg by mouth daily.     . Multiple Vitamin (MULTIVITAMIN WITH MINERALS) TABS tablet Take 1 tablet by mouth daily.    . Omega-3 Fatty Acids (FISH OIL) 1000 MG CAPS Take 1,000 mg by mouth daily.     . OnabotulinumtoxinA (BOTOX IJ) Inject 1 Dose as directed every 6 (six) weeks.     . ondansetron (ZOFRAN ODT) 4 MG disintegrating tablet Take 1 tablet (4 mg total) by mouth every 8 (eight) hours as needed. 20 tablet 0  . pantoprazole (PROTONIX) 40 MG tablet TAKE ONE TABLET BY MOUTH ONE TIME DAILY 90 tablet 1  . PRESCRIPTION MEDICATION Inject into the skin every 6 (six) weeks. Steroid Trigger    . Probiotic Product (PROBIOTIC-10 PO) Take 1 capsule by mouth daily.     . promethazine (PHENERGAN) 25 MG tablet Take 12.5 mg by mouth daily as needed for nausea or vomiting. When having a migraine    . rizatriptan (MAXALT) 10 MG tablet Take 1 tablet (10 mg total) by mouth once as needed  for up to 10 days for migraine. May repeat in 2 hours if needed 10 tablet 0  . triamcinolone (KENALOG) 0.1 % Apply to affected areas BID PRN for up to two weeks at a time as directed. 45 g 3  . TURMERIC PO Take 1 tablet by mouth daily.      No current facility-administered medications for this visit.    PHYSICAL EXAMINATION: ECOG PERFORMANCE STATUS: 1 - Symptomatic but completely ambulatory  Vitals:   03/21/21 0957  BP: 125/65  Pulse: 66  Resp: 17  Temp: 97.6 F (36.4 C)  SpO2: 100%   Filed Weights   03/21/21 0957  Weight: 137 lb 12.8 oz (62.5 kg)    BREAST: No palpable masses or nodules in either right or left breasts. No palpable axillary supraclavicular or infraclavicular adenopathy no breast tenderness or nipple discharge.  I do not see any evidence of lymphedema.  (exam performed in the presence of a chaperone)  LABORATORY DATA:  I have reviewed the data as listed CMP Latest Ref Rng & Units  10/28/2020 10/08/2020 09/05/2020  Glucose 70 - 99 mg/dL - 158(H) -  BUN 8 - 23 mg/dL - 13 -  Creatinine 0.44 - 1.00 mg/dL - 0.70 -  Sodium 135 - 145 mmol/L - 132(L) -  Potassium 3.5 - 5.1 mmol/L - 4.2 -  Chloride 98 - 111 mmol/L - 94(L) -  CO2 22 - 32 mmol/L - 28 -  Calcium 8.9 - 10.3 mg/dL - 9.3 -  Total Protein 6.0 - 8.3 g/dL 7.5 7.9 7.7  Total Bilirubin 0.2 - 1.2 mg/dL 0.4 0.6 0.4  Alkaline Phos 39 - 117 U/L 123(H) 104 133(H)  AST 0 - 37 U/L _0 ALT 0 - 35 U/L _1 Lab Results  Component Value Date   WBC 8.7 10/08/2020   HGB 11.8 (L) 10/08/2020   HCT 38.0 10/08/2020   MCV 79.8 (L) 10/08/2020   PLT 357 10/08/2020   NEUTROABS 2.8 02/26/2020    ASSESSMENT & PLAN:  Malignant neoplasm of upper-inner quadrant of right breast in female, estrogen receptor positive ([place]) 10/10/2018:Right lumpectomy: IDC grade 1, 1.2 cm, with intermediate grade DCIS, margins negative, 0/2 lymph nodes negative, T1CN0 stage Ia ER 100%, PR 90%, Ki-67 10%, HER-2 1+ negative Oncotype  DX: 29: 18% risk of recurrence  Treatment plan: 1.I recommended systemic adjuvant chemotherapy withTaxotere and Cytoxan every 3 weeks x4 cycles(patient refused) 2. Adjuvant radiation therapy12/17/2019-12/05/2018 3. Adjuvant antiestrogen therapywithanastrozole started 1/13/2020switched to letrozole 10/09/19  Letrozoletoxicities:myalgias   Extensive work-up for abdominal pain including CT abdomen pelvis and chest as well as MRCP: All these tests were normal.  Follows with gastroenterology. CT chest showing radiation l related changes in the lung.  There is no concern for metastatic disease.  Concern for right arm lymphedema: No clinical findings of lymphedema.  I reassured her that most of the symptoms that she is experiencing are related to muscle and ligament inflammation and that the NSAIDs for a week would be adequate.  She will cancel her physical therapy appointment for lymphedema.  Breast cancer surveillance: 1.Mammogram 09/11/2020: Benign breast density category D 2.Breast exam 10/08/2020: Benign 3.CT abdomen 09/18/2019: Acute cystitis, 4.5 cm right adnexal cyst 4.Bone density 08/24/2019: T score -2.2: Osteopenia  RTC in November for scheduled follow-up.    No orders of the defined types were placed in this encounter.  The patient has a good understanding of the overall plan. she agrees with it. she will call with any problems that may develop before the next visit here.  Total time spent: 20 mins including face to face time and time spent for planning, charting and coordination of care  Rulon Eisenmenger, MD, MPH 03/21/2021  I, Molly Dorshimer, am acting as scribe for Dr. Nicholas Lose.  I have reviewed the above documentation for accuracy and completeness, and I agree with the above.

## 2021-03-20 NOTE — Progress Notes (Signed)
Subjective:    Patient ID: Jennifer [name], female    DOB: 12/04/1955, 65 y.o.   MRN: [id number]  HPI 65 year old female who  has a past medical history of Allergic rhinitis, Anxiety, Arthritis, Breast cancer ([place]) (2019), Cancer ([place]) (09/2018), Cervical dysplasia, Common bile duct dilation, Endometriosis, Esophageal reflux, Family history of adverse reaction to anesthesia, Family history of breast cancer, Family history of breast cancer, Family history of prostate cancer, Hepatic cyst (08/2020), Hepatic hemangioma (08/2020), Hiatal hernia, Hypothyroid, Migraine with aura, without mention of intractable migraine without mention of status migrainosus, Osteopenia (08/2019), Personal history of radiation therapy (2019), and Pre-diabetes.  She is a patient of Dr. Edsel Petrin who I am seeing today for concern of pain/swelling of her right arm ( wrist and forearm) and swelling to bilateral knee x 1 week. She denies redness, streaking, or warmth. She recently was helping her sister move for about three weeks  and had been lifting heavy boxes and being more physically active then she has been in a long time   She did contact her oncologist who referred her to the lymphedema clinic and she has an appointment at the clinic tomorrow at 11 am and with her oncologist prior to that at 9:30 am\  No chest pain or shortness of breath   Review of Systems  Constitutional: Negative.   Respiratory: Negative.   Cardiovascular: Negative.   Musculoskeletal: Positive for joint swelling and myalgias. Negative for gait problem and neck stiffness.  All other systems reviewed and are negative.  See HPI   Past Medical History:  Diagnosis Date  . Allergic rhinitis   . Anxiety   . Arthritis    hands, knees  . Breast cancer ([place]) 2019   Right Breast Cancer  . Cancer ([place]) 09/2018   Breast CA new DX, right breast  . Cervical dysplasia   . Common bile duct dilation   . Endometriosis   . Esophageal reflux   . Family  history of adverse reaction to anesthesia    younger sister anxiety for a dew days after  . Family history of breast cancer   . Family history of breast cancer   . Family history of prostate cancer   . Hepatic cyst 08/2020   multiple  . Hepatic hemangioma 08/2020   intrahepatic hemangioma  . Hiatal hernia   . Hypothyroid   . Migraine with aura, without mention of intractable migraine without mention of status migrainosus   . Osteopenia 08/2019   T score -2.2 FRAX 11% / 1.7%  . Personal history of radiation therapy 2019   Right Breast Cancer  . Pre-diabetes     Social History   Socioeconomic History  . Marital status: Widowed    Spouse name: Not on file  . Number of children: Not on file  . Years of education: Not on file  . Highest education level: Not on file  Occupational History  . 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 Smoking status: Never Smoker  . Smokeless tobacco: Never Used  Vaping Use  . Vaping Use: Never used  Substance and Sexual Activity  . Alcohol use: No    Alcohol/week: 0.0 standard drinks  . Drug use: No  . Sexual activity: Not Currently    Birth control/protection: Post-menopausal    Comment: 1st intercourse 60 yo-5 partners  Other Topics Concern  . 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   Social Determinants of Health   Financial Resource Strain: Not on file  Food Insecurity: Not on file  Transportation Needs: Not on file  Physical Activity: Not on file  Stress: Not on file  Social Connections: Not on file  Intimate Partner Violence: Not on file    Past Surgical History:  Procedure Laterality Date  . BIOPSY  09/12/2020   Procedure: BIOPSY;  Surgeon: [name], MD;  Location: [facility];  Service: Endoscopy;;  . BREAST EXCISIONAL BIOPSY Bilateral over 10 years ago   benign x 2   . BREAST LUMPECTOMY Right 10/10/2018  . BREAST  LUMPECTOMY WITH RADIOACTIVE SEED AND SENTINEL LYMPH NODE BIOPSY Right 10/10/2018   Procedure: RIGHT BREAST LUMPECTOMY WITH RADIOACTIVE SEED AND RIGHT SENTINEL LYMPH NODE BIOPSY;  Surgeon: Jovita Kussmaul, MD;  Location: [facility];  Service: General;  Laterality: Right;  . BREAST SURGERY     Benign breast lump excised  . CERVICAL CONE BIOPSY  1985   severe dysplasia  . COLONOSCOPY  2005 and dec 2019   normal  . COLPOSCOPY    . endoscopy  10/2018  . ESOPHAGOGASTRODUODENOSCOPY (EGD) WITH PROPOFOL N/A 09/12/2020   Procedure: ESOPHAGOGASTRODUODENOSCOPY (EGD) WITH PROPOFOL;  Surgeon: [name], MD;  Location: [facility];  Service: Endoscopy;  Laterality: N/A;  . EUS N/A 09/12/2020   Procedure: UPPER ENDOSCOPIC ULTRASOUND (EUS) RADIAL;  Surgeon: [name], MD;  Location: [facility];  Service: Endoscopy;  Laterality: N/A;  . LAPAROSCOPIC ASSISTED VAGINAL HYSTERECTOMY    . LAPAROSCOPIC BILATERAL SALPINGO OOPHERECTOMY Bilateral 10/27/2019   Procedure: LAPAROSCOPIC BILATERAL SALPINGO OOPHORECTOMY WITH PERITONEAL WASHINGS;  Surgeon: Princess Bruins, MD;  Location: [facility];  Service: Gynecology;  Laterality: Bilateral;  request 1:00pm on Friday, Dec. 4th in [place] time held requests one hour OR time  . moles removed from upper back, face lft, 1 between breasts, upper leg left inside  10/18/2019   wearing small round bandaids on for 2 weeks  . ROTATOR CUFF REPAIR Bilateral 08/2008    Family History  Problem Relation Age of Onset  . Breast cancer Mother 20  . Diabetes Mother   . Hypertension Father   . Heart disease Father   . Osteoporosis Maternal Grandmother   . Diabetes Maternal Grandmother   . Hypertension Sister   . COPD Paternal Grandmother   . Heart disease Paternal Grandfather   . Breast cancer Cousin 11       pat first cousin  . Breast cancer Cousin        mat second cousin with breast cancer in her 59s  . Prostate  cancer Other 49  . Thyroid cancer Cousin 72       pat first cousin  . Colon cancer Neg Hx   . Stomach cancer Neg Hx   . Esophageal cancer Neg Hx   . Pancreatic cancer Neg Hx     Allergies  Allergen Reactions  . Ciprofloxacin Other (See Comments)    Dizziness   . Codeine Phosphate Other (See Comments)    Dizziness, heart palpitations, nervous  . Decongestant [Pseudoephedrine] Other (See Comments)    dizziness  . Flonase [Fluticasone Propionate] Other (See Comments)    Worsens her migraine   . Hydrocodone Other (See Comments)    Made nervous--10/09 surgery rotator cuff  . Macrobid [Nitrofurantoin]     headaches  . Reglan [Metoclopramide] Other (See Comments)    Dizziness  . Topamax [Topiramate]  Other (See Comments)    Dizziness   . Benadryl [Diphenhydramine Hcl] Palpitations  . Sulfa Antibiotics Anxiety    Current Outpatient Medications on File Prior to Visit  Medication Sig Dispense Refill  . ALPRAZolam (XANAX) 0.5 MG tablet TAKE 1/2 TO 1 TABLET BY MOUTH ONCE DAILY AS NEEDED FOR SLEEP/ANXIETY 15 tablet 1  . atenolol (TENORMIN) 25 MG tablet Take 12.5 mg by mouth 3 (three) times daily. For migraine prevention    . CALCIUM CARBONATE-VITAMIN D PO Take 1 tablet by mouth daily. 1000 mg    . cephALEXin (KEFLEX) 500 MG capsule Take 1 capsule (500 mg total) by mouth 2 (two) times daily. 14 capsule 0  . famotidine (PEPCID) 40 MG tablet Take 40 mg by mouth 2 (two) times daily.     . hydrocortisone 2.5 % cream Apply topically 2 (two) times daily as needed (Rash). (Patient taking differently: Apply 1 application topically daily as needed (Rash).) 30 g 1  . hydrocortisone 2.5 % cream Apply to affected areas BID PRN as directed. 30 g 3  . hydrOXYzine (ATARAX/VISTARIL) 25 MG tablet Take 1 tablet (25 mg total) by mouth 3 (three) times daily as needed. (Patient taking differently: Take 31.25 mg by mouth 2 (two) times daily.) 30 tablet 0  . ketorolac (TORADOL) 10 MG tablet Take 1 tablet (10 mg  total) by mouth every 6 (six) hours as needed for moderate pain. (Patient taking differently: Take 2.5 mg by mouth 2 (two) times a week.) 12 tablet 0  . letrozole (FEMARA) 2.5 MG tablet TAKE 1 TABLET BY MOUTH EVERY DAY 90 tablet 3  . levocetirizine (XYZAL) 5 MG tablet Take 5 mg by mouth at bedtime.   3  . levothyroxine (SYNTHROID) 75 MCG tablet TAKE ONE TABLET BY MOUTH DAILY BEFORE BREAKFAST 30 tablet 5  . Magnesium Oxide 400 (240 Mg) MG TABS Take 400 mg by mouth daily.     . Multiple Vitamin (MULTIVITAMIN WITH MINERALS) TABS tablet Take 1 tablet by mouth daily.    . Omega-3 Fatty Acids (FISH OIL) 1000 MG CAPS Take 1,000 mg by mouth daily.     . OnabotulinumtoxinA (BOTOX IJ) Inject 1 Dose as directed every 6 (six) weeks.     . ondansetron (ZOFRAN ODT) 4 MG disintegrating tablet Take 1 tablet (4 mg total) by mouth every 8 (eight) hours as needed. 20 tablet 0  . pantoprazole (PROTONIX) 40 MG tablet TAKE ONE TABLET BY MOUTH ONE TIME DAILY 90 tablet 1  . PRESCRIPTION MEDICATION Inject into the skin every 6 (six) weeks. Steroid Trigger    . Probiotic Product (PROBIOTIC-10 PO) Take 1 capsule by mouth daily.     . promethazine (PHENERGAN) 25 MG tablet Take 12.5 mg by mouth daily as needed for nausea or vomiting. When having a migraine    . triamcinolone (KENALOG) 0.1 % Apply to affected areas BID PRN for up to two weeks at a time as directed. 45 g 3  . TURMERIC PO Take 1 tablet by mouth daily.     . rizatriptan (MAXALT) 10 MG tablet Take 1 tablet (10 mg total) by mouth once as needed for up to 10 days for migraine. May repeat in 2 hours if needed 10 tablet 0   No current facility-administered medications on file prior to visit.    BP 124/62 (BP Location: Left Arm, Patient Position: Sitting, Cuff Size: Normal)   Pulse 61   Temp 97.7 F (36.5 C) (Oral)   Wt 136 lb 3.2 oz (  61.8 kg)   SpO2 99%   BMI 26.60 kg/m       Objective:   Physical Exam Vitals and nursing note reviewed.  Constitutional:       Appearance: Normal appearance.  Cardiovascular:     Rate and Rhythm: Normal rate and regular rhythm.     Pulses: Normal pulses.     Heart sounds: Normal heart sounds.  Pulmonary:     Effort: Pulmonary effort is normal.     Breath sounds: Normal breath sounds.  Abdominal:     General: Abdomen is flat.     Palpations: Abdomen is soft.  Musculoskeletal:        General: Tenderness present. No deformity. Normal range of motion.     Right elbow: No swelling, deformity, effusion or lacerations. Normal range of motion. Tenderness present in lateral epicondyle.     Left elbow: Normal.     Right wrist: Tenderness present. No swelling, deformity, effusion, lacerations, bony tenderness, snuff box tenderness or crepitus. Normal range of motion. Normal pulse.     Left wrist: Normal.     Right knee: Swelling present. Normal range of motion. No tenderness.     Left knee: Swelling present. Normal range of motion. No tenderness.     Right lower leg: No edema.     Left lower leg: No edema.     Comments: Has pain along extensor tendons in right wrist  As well as along the lateral epicondyle.   Very mild soft tissue swelling above bilateral knees. No edema noted to knees or lower legs. No bakers cyst   Skin:    General: Skin is warm and dry.     Capillary Refill: Capillary refill takes less than 2 seconds.  Neurological:     General: No focal deficit present.     Mental Status: She is alert and oriented to person, place, and time.  Psychiatric:        Mood and Affect: Mood normal.        Behavior: Behavior normal.       Assessment & Plan:  Signs and symptoms of right upper extremity discomfort seem to be related to tendinopathy likely from overuse injury.  Advised conservative measures such as Aleve for the next 3 days, rest, and ice.  No swelling noted to the right upper extremity to correlate with lymphedema  Swelling above bilateral knees appears to be soft tissue related, no localized  edema or effusion.  Again advised conservative measures with Aleve, ice, and rest.  Can follow-up with oncology tomorrow for second opinion as well as PCP if symptoms continue

## 2021-03-21 ENCOUNTER — Ambulatory Visit: Payer: BC Managed Care – PPO

## 2021-03-21 ENCOUNTER — Inpatient Hospital Stay: Payer: BC Managed Care – PPO | Attending: Hematology and Oncology | Admitting: Hematology and Oncology

## 2021-03-21 DIAGNOSIS — C50211 Malignant neoplasm of upper-inner quadrant of right female breast: Secondary | ICD-10-CM | POA: Insufficient documentation

## 2021-03-21 DIAGNOSIS — Z923 Personal history of irradiation: Secondary | ICD-10-CM | POA: Insufficient documentation

## 2021-03-21 DIAGNOSIS — Z170 Estrogen receptor positive status [ER+]: Secondary | ICD-10-CM | POA: Diagnosis not present

## 2021-03-21 DIAGNOSIS — Z79811 Long term (current) use of aromatase inhibitors: Secondary | ICD-10-CM | POA: Insufficient documentation

## 2021-03-21 DIAGNOSIS — Z79899 Other long term (current) drug therapy: Secondary | ICD-10-CM | POA: Diagnosis not present

## 2021-03-21 NOTE — Assessment & Plan Note (Signed)
10/10/2018:Right lumpectomy: IDC grade 1, 1.2 cm, with intermediate grade DCIS, margins negative, 0/2 lymph nodes negative, T1CN0 stage Ia ER 100%, PR 90%, Ki-67 10%, HER-2 1+ negative Oncotype DX: 29: 18% risk of recurrence  Treatment plan: 1.I recommended systemic adjuvant chemotherapy withTaxotere and Cytoxan every 3 weeks x4 cycles(patient refused) 2. Adjuvant radiation therapy12/17/2019-12/05/2018 3. Adjuvant antiestrogen therapywithanastrozole started 1/13/2020switched to letrozole 10/09/19  Anastrozoletoxicities:myalgias We will switch to Letrozole  Extensive work-up for abdominal pain including CT abdomen pelvis and chest as well as MRCP: All these tests were normal.  Follows with gastroenterology. CT chest showing radiation l related changes in the lung.  There is no concern for metastatic disease.  Concern for right arm lymphedema: Recommended physical therapy evaluation.  Breast cancer surveillance: 1.Mammogram 09/11/2020: Benign breast density category D 2.Breast exam 10/08/2020: Benign 3.CT abdomen 09/18/2019: Acute cystitis, 4.5 cm right adnexal cyst 4.Bone density 08/24/2019: T score -2.2: Osteopenia  RTC in 1 year 

## 2021-04-02 DIAGNOSIS — G43019 Migraine without aura, intractable, without status migrainosus: Secondary | ICD-10-CM | POA: Diagnosis not present

## 2021-04-02 DIAGNOSIS — G43719 Chronic migraine without aura, intractable, without status migrainosus: Secondary | ICD-10-CM | POA: Diagnosis not present

## 2021-04-02 DIAGNOSIS — M542 Cervicalgia: Secondary | ICD-10-CM | POA: Diagnosis not present

## 2021-04-08 NOTE — Progress Notes (Signed)
Jennifer [name] T. Veralyn Lopp, MD, [place] at [facility] [place], [address]  Phone: [phone]  FAX: [place] - 65 y.o. female  MRN [id number]  Date of Birth: September 08, 1956  Date: 04/09/2021  PCP: Abner Greenspan, MD  Referral: Abner Greenspan, MD  Chief Complaint  Patient presents with  . Wrist Pain    right    This visit occurred during the SARS-CoV-2 public health emergency.  Safety protocols were in place, including screening questions prior to the visit, additional usage of staff PPE, and extensive cleaning of exam room while observing appropriate contact time as indicated for disinfecting solutions.   Subjective:   Jennifer [name] is a 65 y.o. very pleasant female patient with Body mass index is 26.76 kg/m. who presents with the following:  She is a pleasant patient, and she is here in follow-up after she saw Mr. Carlisle Cater regarding some right upper extremity pain.  She was having both some right upper extremity swelling and pain as well as some bilateral knee pain.  4 weeks ago, went to visit her sister and did a lot of pushing and pulling.  A lot of dust, and constantly cleaning and doing other things.   Was hurting all over the place. Had some swelling and  Was worried about lympedema.   Now: Patient complains of pain in the first dorsal compartment just proximal to the [facility] joint, but primarily in the extensor aspect of the thumb. There is some fullness in this area. She has not had any occult trauma. There is no ecchymosis. There is a mild amount of swelling. There is no significant CMC pain. No prior history of trauma or surgery in the affected hand.  Review of Systems is noted in the HPI, as appropriate   Objective:   BP 112/76   Pulse 64   Temp (!) 97.5 F (36.4 C) (Temporal)   Ht 5' (1.524 m)   Wt 137 lb (62.1 kg)   SpO2 98%   BMI 26.76 kg/m    R  hand Ecchymosis or edema: neg ROM wrist/hand/digits: full  Carpals, MCP's, digits: NT Distal Ulna and Radius: NT Ecchymosis or edema: neg No instability Cysts/nodules: neg Digit triggering: neg Finkelstein's test: positive Snuffbox tenderness: neg Scaphoid tubercle: NT Resisted supination: NT Full composite fist, no malrotation Grip, all digits: 5/5 str DIPJT: NT PIP JT: NT MCP JT: NT No tenosynovitis Axial load test: neg Phalen's: neg Tinel's: neg Atrophy: neg  Hand sensation: intact   Radiology: No results found.  Assessment and Plan:     ICD-10-CM   1. De Quervain's tenosynovitis, right  M65.4    Classic tenosynovitis of the 1st dorsal compartment.  This anatomy was reviewed with the patient.  Place the thumb in a thumb spica splint Hand exercises in a week or so based on pain level  If continues to do poorly, may need PT +/- inject the 1st dorsal sheath if not doing well at all.  Patient Instructions  Voltaren 1% gel. Over the counter You can apply up to 4 times a day Minimal is absorbed in the bloodstream Cost is about 9 dollars    No orders of the defined types were placed in this encounter.  Medications Discontinued During This Encounter  Medication Reason  . cephALEXin (KEFLEX) 500 MG capsule Error   No orders of the defined types were placed in this encounter.  Follow-up: No follow-ups on file.  Signed,  Maud Deed. Ellsie Violette, MD   Outpatient Encounter Medications as of 04/09/2021  Medication Sig  . ALPRAZolam (XANAX) 0.5 MG tablet TAKE 1/2 TO 1 TABLET BY MOUTH ONCE DAILY AS NEEDED FOR SLEEP/ANXIETY  . atenolol (TENORMIN) 25 MG tablet Take 12.5 mg by mouth 3 (three) times daily. For migraine prevention  . CALCIUM CARBONATE-VITAMIN D PO Take 1 tablet by mouth daily. 1000 mg  . famotidine (PEPCID) 40 MG tablet Take 40 mg by mouth 2 (two) times daily.   . hydrocortisone 2.5 % cream Apply topically 2 (two) times daily as needed (Rash). (Patient  taking differently: Apply 1 application topically daily as needed (Rash).)  . hydrocortisone 2.5 % cream Apply to affected areas BID PRN as directed.  . hydrOXYzine (ATARAX/VISTARIL) 25 MG tablet Take 1 tablet (25 mg total) by mouth 3 (three) times daily as needed. (Patient taking differently: Take 31.25 mg by mouth 2 (two) times daily.)  . ketorolac (TORADOL) 10 MG tablet Take 1 tablet (10 mg total) by mouth every 6 (six) hours as needed for moderate pain. (Patient taking differently: Take 2.5 mg by mouth 2 (two) times a week.)  . letrozole (FEMARA) 2.5 MG tablet TAKE 1 TABLET BY MOUTH EVERY DAY  . levocetirizine (XYZAL) 5 MG tablet Take 5 mg by mouth at bedtime.   Marland Kitchen levothyroxine (SYNTHROID) 75 MCG tablet TAKE ONE TABLET BY MOUTH DAILY BEFORE BREAKFAST  . Magnesium Oxide 400 (240 Mg) MG TABS Take 400 mg by mouth daily.   . Multiple Vitamin (MULTIVITAMIN WITH MINERALS) TABS tablet Take 1 tablet by mouth daily.  . Omega-3 Fatty Acids (FISH OIL) 1000 MG CAPS Take 1,000 mg by mouth daily.   . OnabotulinumtoxinA (BOTOX IJ) Inject 1 Dose as directed every 6 (six) weeks.   . ondansetron (ZOFRAN ODT) 4 MG disintegrating tablet Take 1 tablet (4 mg total) by mouth every 8 (eight) hours as needed.  . pantoprazole (PROTONIX) 40 MG tablet TAKE ONE TABLET BY MOUTH ONE TIME DAILY  . PRESCRIPTION MEDICATION Inject into the skin every 6 (six) weeks. Steroid Trigger  . Probiotic Product (PROBIOTIC-10 PO) Take 1 capsule by mouth daily.   . promethazine (PHENERGAN) 25 MG tablet Take 12.5 mg by mouth daily as needed for nausea or vomiting. When having a migraine  . triamcinolone (KENALOG) 0.1 % Apply to affected areas BID PRN for up to two weeks at a time as directed.  . TURMERIC PO Take 1 tablet by mouth daily.   . [DISCONTINUED] cephALEXin (KEFLEX) 500 MG capsule Take 1 capsule (500 mg total) by mouth 2 (two) times daily.  . rizatriptan (MAXALT) 10 MG tablet Take 1 tablet (10 mg total) by mouth once as needed for  up to 10 days for migraine. May repeat in 2 hours if needed   No facility-administered encounter medications on file as of 04/09/2021.

## 2021-04-09 ENCOUNTER — Encounter: Payer: Self-pay | Admitting: Family Medicine

## 2021-04-09 ENCOUNTER — Other Ambulatory Visit: Payer: Self-pay

## 2021-04-09 ENCOUNTER — Ambulatory Visit: Payer: BC Managed Care – PPO | Admitting: Family Medicine

## 2021-04-09 VITALS — BP 112/76 | HR 64 | Temp 97.5°F | Ht 60.0 in | Wt 137.0 lb

## 2021-04-09 DIAGNOSIS — S46911A Strain of unspecified muscle, fascia and tendon at shoulder and upper arm level, right arm, initial encounter: Secondary | ICD-10-CM

## 2021-04-09 DIAGNOSIS — M654 Radial styloid tenosynovitis [de Quervain]: Secondary | ICD-10-CM

## 2021-04-09 DIAGNOSIS — M25561 Pain in right knee: Secondary | ICD-10-CM

## 2021-04-09 NOTE — Patient Instructions (Signed)
Voltaren 1% gel. Over the counter You can apply up to 4 times a day Minimal is absorbed in the bloodstream Cost is about 9 dollars 

## 2021-04-18 ENCOUNTER — Ambulatory Visit: Payer: BC Managed Care – PPO | Admitting: Family Medicine

## 2021-04-18 ENCOUNTER — Encounter: Payer: Self-pay | Admitting: Family Medicine

## 2021-04-18 ENCOUNTER — Other Ambulatory Visit: Payer: Self-pay

## 2021-04-18 ENCOUNTER — Ambulatory Visit (INDEPENDENT_AMBULATORY_CARE_PROVIDER_SITE_OTHER)
Admission: RE | Admit: 2021-04-18 | Discharge: 2021-04-18 | Disposition: A | Discharge status: Home / Self Care | Payer: BC Managed Care – PPO | Source: Ambulatory Visit | Attending: Family Medicine | Admitting: Family Medicine

## 2021-04-18 VITALS — BP 122/80 | HR 68 | Temp 96.8°F | Ht 60.0 in | Wt 138.3 lb

## 2021-04-18 DIAGNOSIS — M654 Radial styloid tenosynovitis [de Quervain]: Secondary | ICD-10-CM | POA: Diagnosis not present

## 2021-04-18 DIAGNOSIS — M25531 Pain in right wrist: Secondary | ICD-10-CM

## 2021-04-18 NOTE — Progress Notes (Signed)
Subjective:    Patient ID: Jennifer [name], female    DOB: 01-10-1956, 65 y.o.   MRN: [id number]  This visit occurred during the SARS-CoV-2 public health emergency.  Safety protocols were in place, including screening questions prior to the visit, additional usage of staff PPE, and extensive cleaning of exam room while observing appropriate contact time as indicated for disinfecting solutions.    HPI Pt presents with c/o wrist pain (right) for 3 weeks   Wt Readings from Last 3 Encounters:  04/18/21 138 lb 5 oz (62.7 kg)  04/09/21 137 lb (62.1 kg)  03/21/21 137 lb 12.8 oz (62.5 kg)   27.01 kg/m  Last visit with Dr Lorelei Pont 5/18  Was diagnosed with Tennis Must Quervain's tenosynovitis   A/P from that visit Classic tenosynovitis of the 1st dorsal compartment.  This anatomy was reviewed with the patient.  Place the thumb in a thumb spica splint Hand exercises in a week or so based on pain level  If continues to do poorly, may need PT +/- inject the 1st dorsal sheath if not doing well at all.  Patient Instructions  Voltaren 1% gel. Over the counter You can apply up to 4 times a day Minimal is absorbed in the bloodstream Cost is about 9 dollars   She tried to use the voltaren gel - no time to do it (she is not home a lot)  Worried about safety  Not using ice   She is worried a bit about side effects of the gel   Still painful Even when not moving it -is tender to the touch Enough to make her nauseated   Feels like not getting better   Patient Active Problem List   Diagnosis Date Noted  . Right wrist pain 04/18/2021  . De Quervain's tenosynovitis, right 04/18/2021  . Anemia 09/08/2020  . Common bile duct dilatation 09/06/2020  . Opacity of lung on imaging study 09/06/2020  . Weakness of right foot 07/10/2020  . Dizziness 07/10/2020  . History of breast cancer 03/01/2020  . Genetic testing 11/14/2019  . Bilateral hand pain 11/03/2019  . Family history of prostate cancer   .  Adnexal cyst 09/26/2019  . Cystitis with hematuria 09/26/2019  . Pain of left calf 03/21/2019  . Use of anastrozole (Arimidex) 03/21/2019  . Elevated transaminase level 01/03/2019  . Family history of breast cancer   . Stress reaction 09/26/2018  . Malignant neoplasm of upper-inner quadrant of right breast in female, estrogen receptor positive ([place]) 09/20/2018  . B12 deficiency 04/13/2018  . Joint pain 04/11/2018  . Paresthesia 04/11/2018  . Colon cancer screening 09/16/2017  . Prediabetes 07/28/2016  . Allergic rhinitis 11/13/2014  . Screening for lipoid disorders 09/26/2014  . Preventative health care 09/26/2014  . Dysuria 08/07/2014  . Routine general medical examination at a health care facility 10/28/2012  . Herpes labialis 04/28/2011  . GERD 09/16/2010  . Hypothyroidism 05/07/2008  . Osteopenia 05/07/2008  . Migraine with aura 11/11/2007   Past Medical History:  Diagnosis Date  . Allergic rhinitis   . Anxiety   . Arthritis    hands, knees  . Breast cancer ([place]) 2019   Right Breast Cancer  . Cancer ([place]) 09/2018   Breast CA new DX, right breast  . Cervical dysplasia   . Common bile duct dilation   . Endometriosis   . Esophageal reflux   . Family history of adverse reaction to anesthesia    younger sister anxiety for a dew days  after  . Family history of breast cancer   . Family history of breast cancer   . Family history of prostate cancer   . Hepatic cyst 08/2020   multiple  . Hepatic hemangioma 08/2020   intrahepatic hemangioma  . Hiatal hernia   . Hypothyroid   . Migraine with aura, without mention of intractable migraine without mention of status migrainosus   . Osteopenia 08/2019   T score -2.2 FRAX 11% / 1.7%  . Personal history of radiation therapy 2019   Right Breast Cancer  . Pre-diabetes    Past Surgical History:  Procedure Laterality Date  . BIOPSY  09/12/2020   Procedure: BIOPSY;  Surgeon: [name], MD;  Location: [facility];   Service: Endoscopy;;  . BREAST EXCISIONAL BIOPSY Bilateral over 10 years ago   benign x 2   . BREAST LUMPECTOMY Right 10/10/2018  . BREAST LUMPECTOMY WITH RADIOACTIVE SEED AND SENTINEL LYMPH NODE BIOPSY Right 10/10/2018   Procedure: RIGHT BREAST LUMPECTOMY WITH RADIOACTIVE SEED AND RIGHT SENTINEL LYMPH NODE BIOPSY;  Surgeon: Jovita Kussmaul, MD;  Location: [place];  Service: General;  Laterality: Right;  . BREAST SURGERY     Benign breast lump excised  . CERVICAL CONE BIOPSY  1985   severe dysplasia  . COLONOSCOPY  2005 and dec 2019   normal  . COLPOSCOPY    . endoscopy  10/2018  . ESOPHAGOGASTRODUODENOSCOPY (EGD) WITH PROPOFOL N/A 09/12/2020   Procedure: ESOPHAGOGASTRODUODENOSCOPY (EGD) WITH PROPOFOL;  Surgeon: [name], MD;  Location: [facility];  Service: Endoscopy;  Laterality: N/A;  . EUS N/A 09/12/2020   Procedure: UPPER ENDOSCOPIC ULTRASOUND (EUS) RADIAL;  Surgeon: [name], MD;  Location: [facility];  Service: Endoscopy;  Laterality: N/A;  . LAPAROSCOPIC ASSISTED VAGINAL HYSTERECTOMY    . LAPAROSCOPIC BILATERAL SALPINGO OOPHERECTOMY Bilateral 10/27/2019   Procedure: LAPAROSCOPIC BILATERAL SALPINGO OOPHORECTOMY WITH PERITONEAL WASHINGS;  Surgeon: Princess Bruins, MD;  Location: [facility];  Service: Gynecology;  Laterality: Bilateral;  request 1:00pm on Friday, Dec. 4th in [place] time held requests one hour OR time  . moles removed from upper back, face lft, 1 between breasts, upper leg left inside  10/18/2019   wearing small round bandaids on for 2 weeks  . ROTATOR CUFF REPAIR Bilateral 08/2008   Social History   Tobacco Use  . Smoking status: Never Smoker  . Smokeless tobacco: Never Used  Vaping Use  . Vaping Use: Never used  Substance Use Topics  . Alcohol use: No    Alcohol/week: 0.0 standard drinks  . Drug use: No   Family History  Problem Relation Age of Onset  . Breast cancer Mother 67  .  Diabetes Mother   . Hypertension Father   . Heart disease Father   . Osteoporosis Maternal Grandmother   . Diabetes Maternal Grandmother   . Hypertension Sister   . COPD Paternal Grandmother   . Heart disease Paternal Grandfather   . Breast cancer Cousin 14       pat first cousin  . Breast cancer Cousin        mat second cousin with breast cancer in her 2s  . Prostate cancer Other 46  . Thyroid cancer Cousin 81       pat first cousin  . Colon cancer Neg Hx   . Stomach cancer Neg Hx   . Esophageal cancer Neg Hx   . Pancreatic cancer Neg Hx    Allergies  Allergen Reactions  . Ciprofloxacin Other (See Comments)    Dizziness   . Codeine Phosphate Other (See Comments)    Dizziness, heart palpitations, nervous  . Decongestant [Pseudoephedrine] Other (See Comments)    dizziness  . Flonase [Fluticasone Propionate] Other (See Comments)    Worsens her migraine   . Hydrocodone Other (See Comments)    Made nervous--10/09 surgery rotator cuff  . Macrobid [Nitrofurantoin]     headaches  . Reglan [Metoclopramide] Other (See Comments)    Dizziness  . Topamax [Topiramate] Other (See Comments)    Dizziness   . Benadryl [Diphenhydramine Hcl] Palpitations  . Sulfa Antibiotics Anxiety   Current Outpatient Medications on File Prior to Visit  Medication Sig Dispense Refill  . ALPRAZolam (XANAX) 0.5 MG tablet TAKE 1/2 TO 1 TABLET BY MOUTH ONCE DAILY AS NEEDED FOR SLEEP/ANXIETY 15 tablet 1  . atenolol (TENORMIN) 25 MG tablet Take 12.5 mg by mouth 3 (three) times daily. For migraine prevention    . CALCIUM CARBONATE-VITAMIN D PO Take 1 tablet by mouth daily. 1000 mg    . famotidine (PEPCID) 40 MG tablet Take 40 mg by mouth 2 (two) times daily.     . hydrocortisone 2.5 % cream Apply topically 2 (two) times daily as needed (Rash). (Patient taking differently: Apply 1 application topically daily as needed (Rash).) 30 g 1  . hydrocortisone 2.5 % cream Apply to affected areas BID PRN as directed.  30 g 3  . hydrOXYzine (ATARAX/VISTARIL) 25 MG tablet Take 1 tablet (25 mg total) by mouth 3 (three) times daily as needed. (Patient taking differently: Take 31.25 mg by mouth 2 (two) times daily.) 30 tablet 0  . ketorolac (TORADOL) 10 MG tablet Take 1 tablet (10 mg total) by mouth every 6 (six) hours as needed for moderate pain. (Patient taking differently: Take 2.5 mg by mouth 2 (two) times a week.) 12 tablet 0  . letrozole (FEMARA) 2.5 MG tablet TAKE 1 TABLET BY MOUTH EVERY DAY 90 tablet 3  . levocetirizine (XYZAL) 5 MG tablet Take 5 mg by mouth at bedtime.   3  . levothyroxine (SYNTHROID) 75 MCG tablet TAKE ONE TABLET BY MOUTH DAILY BEFORE BREAKFAST 30 tablet 5  . Magnesium Oxide 400 (240 Mg) MG TABS Take 400 mg by mouth daily.     . Multiple Vitamin (MULTIVITAMIN WITH MINERALS) TABS tablet Take 1 tablet by mouth daily.    . Omega-3 Fatty Acids (FISH OIL) 1000 MG CAPS Take 1,000 mg by mouth daily.     . OnabotulinumtoxinA (BOTOX IJ) Inject 1 Dose as directed every 6 (six) weeks.     . ondansetron (ZOFRAN ODT) 4 MG disintegrating tablet Take 1 tablet (4 mg total) by mouth every 8 (eight) hours as needed. 20 tablet 0  . pantoprazole (PROTONIX) 40 MG tablet TAKE ONE TABLET BY MOUTH ONE TIME DAILY 90 tablet 1  . PRESCRIPTION MEDICATION Inject into the skin every 6 (six) weeks. Steroid Trigger    . Probiotic Product (PROBIOTIC-10 PO) Take 1 capsule by mouth daily.     . promethazine (PHENERGAN) 25 MG tablet Take 12.5 mg by mouth daily as needed for nausea or vomiting. When having a migraine    . triamcinolone (KENALOG) 0.1 % Apply to affected areas BID PRN for up to two weeks at a time as directed. 45 g 3  . TURMERIC PO Take 1 tablet by mouth daily.     . rizatriptan (MAXALT) 10 MG tablet Take 1 tablet (10 mg  total) by mouth once as needed for up to 10 days for migraine. May repeat in 2 hours if needed 10 tablet 0   No current facility-administered medications on file prior to visit.     Review  of Systems  Constitutional: Negative for activity change, appetite change, fatigue, fever and unexpected weight change.  HENT: Negative for congestion, ear pain, rhinorrhea, sinus pressure and sore throat.   Eyes: Negative for pain, redness and visual disturbance.  Respiratory: Negative for cough, shortness of breath and wheezing.   Cardiovascular: Negative for chest pain and palpitations.  Gastrointestinal: Negative for abdominal pain, blood in stool, constipation and diarrhea.  Endocrine: Negative for polydipsia and polyuria.  Genitourinary: Negative for dysuria, frequency and urgency.  Musculoskeletal: Negative for arthralgias, back pain and myalgias.       Right wrist pain  Skin: Negative for pallor and rash.  Allergic/Immunologic: Negative for environmental allergies.  Neurological: Negative for dizziness, syncope and headaches.  Hematological: Negative for adenopathy. Does not bruise/bleed easily.  Psychiatric/Behavioral: Negative for decreased concentration and dysphoric mood. The patient is not nervous/anxious.        Objective:   Physical Exam Constitutional:      General: She is not in acute distress.    Appearance: Normal appearance. She is normal weight. She is not ill-appearing.  Eyes:     Conjunctiva/sclera: Conjunctivae normal.     Pupils: Pupils are equal, round, and reactive to light.  Cardiovascular:     Rate and Rhythm: Normal rate and regular rhythm.     Pulses: Normal pulses.  Pulmonary:     Effort: Pulmonary effort is normal. No respiratory distress.  Musculoskeletal:     Right wrist: Tenderness and snuff box tenderness present. No swelling, deformity or crepitus. Decreased range of motion. Normal pulse.     Comments: Right wrist  Tender laterally  Positive Finkelstin test  No crepitus  Pain to fully flex    Lymphadenopathy:     Cervical: No cervical adenopathy.  Skin:    Findings: No erythema or rash.  Neurological:     Mental Status: She is alert.      Sensory: No sensory deficit.     Motor: No weakness.     Deep Tendon Reflexes: Reflexes normal.  Psychiatric:        Mood and Affect: Mood normal.           Assessment & Plan:   Problem List Items Addressed This Visit      Musculoskeletal and Integument   De Quervain's tenosynovitis, right    No improvement so far using splint on and off  Adv to please try the voltaren gel and also some ice when able  Will continue the splint  Wrist film today  Arrange to f/u with Dr Lorelei Pont (may req PT or injection or both)        Other   Right wrist pain - Primary    Suspect from De Quervain's tenosynovitis Reviewed progress note from Dr Lorelei Pont in detail Xray today  Reviewed plan to start voltaren gel Will f/u with Dr Lorelei Pont      Relevant Orders   DG Wrist Complete Right (Completed)

## 2021-04-18 NOTE — Patient Instructions (Addendum)
Use the voltaren gel up to 4 times a day   When you get a chance- put some ice on it  Wear the splint   Xray today   Follow up with Dr Lorelei Pont next week

## 2021-04-21 NOTE — Assessment & Plan Note (Signed)
Suspect from De Quervain's tenosynovitis Reviewed progress note from Dr Lorelei Pont in detail Xray today  Reviewed plan to start voltaren gel Will f/u with Dr Lorelei Pont

## 2021-04-21 NOTE — Assessment & Plan Note (Signed)
No improvement so far using splint on and off  Adv to please try the voltaren gel and also some ice when able  Will continue the splint  Wrist film today  Arrange to f/u with Dr Lorelei Pont (may req PT or injection or both)

## 2021-04-22 ENCOUNTER — Other Ambulatory Visit: Payer: Self-pay | Admitting: Family Medicine

## 2021-04-24 ENCOUNTER — Telehealth: Payer: Self-pay

## 2021-04-24 MED ORDER — PREDNISONE 20 MG PO TABS: ORAL_TABLET | ORAL | 0 refills | Status: DC

## 2021-04-24 NOTE — Telephone Encounter (Signed)
Can you call  I sent her in some oral steroids to take through the weekend.  That might be all that she needs to calm her wrist down.  Then we can recheck it early next week, and we can talk about different things that often help this kind of tendonitis.

## 2021-04-24 NOTE — Telephone Encounter (Signed)
Thanks for seeing her.  Any advisement or reassurance would be appreciated

## 2021-04-24 NOTE — Telephone Encounter (Signed)
[place] Night - Client Nonclinical Telephone Record [facility] Client [facility] Night - Client Client Site [place] Physician [place], Roque Lias - MD Contact Type Call Who Is Calling Patient / Member / Family / Caregiver Caller Name Frederick Phone Number [phone] Patient Name Jennifer [name] Patient DOB [birth year] Call Type Message Only Information Provided Reason for Call Request to Schedule Office Appointment Initial Comment Caller states she wanted to schedule an appt with the sport medicine specialist. Caller states her wrist pain is not getting better and she wants a steroid shot as soon as possible. Caller states the Voltaren is causing her to have migraines. Patient request to speak to RN No Additional Comment Caller declined triage and office hours was provided. Disp. Time Disposition Final User 04/23/2021 5:19:34 PM General Information Provided Yes Josephine Cables Call Closed By: Josephine Cables Transaction Date/Time: 04/23/2021 5:11:43 PM (ET)

## 2021-04-24 NOTE — Telephone Encounter (Signed)
De Q for a few weeks. Not urgent.  Most physicians including myself do not do tendon sheath injections in these cases that quickly.  I am happy to reassess her next week.

## 2021-04-24 NOTE — Addendum Note (Signed)
Addended by: Owens Loffler on: 04/24/2021 05:54 PM   Modules accepted: Orders

## 2021-04-24 NOTE — Telephone Encounter (Signed)
Per appt notes pt already has appt with Dr Lorelei Pont on 04/28/21.

## 2021-04-24 NOTE — Telephone Encounter (Signed)
[address] Day - Client TELEPHONE ADVICE RECORD [facility]urse Patient Name: Jennifer [name] D Gender: Female DOB: [birth year] Age: 65 Y 44 M 24 D Return Phone Number: [phone] (Primary) Address: City/ State/ Zip: [place] [address] Client [place] Day - Client Client Site Ferdinand MD Contact Type Call Who Is Calling Patient / Member / Family / Caregiver Call Type Triage / Clinical Relationship To Patient Self Return Phone Number [phone] (Primary) Chief Complaint Hand Pain Reason for Call Symptomatic / Request for [name]. Caller is wanting a cortisone shot .Caller was transferred from the office for triage since they do not have any appts. available today. Translation No Disp. Time Eilene Ghazi Time) Disposition Final User 04/24/2021 9:39:15 AM Send To RN Personal Markus Daft, RN, Windy 04/24/2021 9:42:00 AM Attempt made - message left Carmon, RN, Langley Gauss 04/24/2021 9:54:27 AM FINAL ATTEMPT MADE - message left Thad Ranger, RN, Langley Gauss 04/24/2021 9:54:33 AM Send to RN Final Attempt Christin Bach, RN, Langley Gauss 04/24/2021 10:52:33 AM FINAL ATTEMPT MADE - message left Yes Radford Pax, RN, Eugene Garnet

## 2021-04-24 NOTE — Telephone Encounter (Signed)
Good, aware I will watch for correspondence

## 2021-04-25 NOTE — Telephone Encounter (Signed)
Tried calling the patient to inform her about the oral steroids that Dr. Lorelei Pont sent in for her to take through the weekend. Patient did not answer LVM for her to call back.

## 2021-04-25 NOTE — Telephone Encounter (Signed)
Patient advised and verbalized understanding 

## 2021-04-28 ENCOUNTER — Ambulatory Visit: Payer: BC Managed Care – PPO | Admitting: Family Medicine

## 2021-04-28 ENCOUNTER — Encounter: Payer: Self-pay | Admitting: Family Medicine

## 2021-04-28 ENCOUNTER — Other Ambulatory Visit: Payer: Self-pay

## 2021-04-28 VITALS — BP 128/80 | HR 72 | Temp 98.5°F | Ht 60.0 in | Wt 138.2 lb

## 2021-04-28 DIAGNOSIS — M654 Radial styloid tenosynovitis [de Quervain]: Secondary | ICD-10-CM | POA: Diagnosis not present

## 2021-04-28 MED ORDER — TRIAMCINOLONE ACETONIDE 40 MG/ML IJ SUSP
20.0000 mg | Freq: Once | INTRAMUSCULAR | Status: AC
  Administered 2021-04-28: 20 mg via INTRA_ARTICULAR

## 2021-04-28 NOTE — Progress Notes (Signed)
Jennifer Etcheverry T. Prudencio Velazco, MD, [place] [facility] [place], [address]  Phone: [phone]  FAX: [place] - 65 y.o. female  MRN [id number]  Date of Birth: July 13, 1956  Date: 04/28/2021  PCP: Jennifer Greenspan, MD  Referral: Jennifer Greenspan, MD  Chief Complaint  Patient presents with  . Wrist Pain    Right    This visit occurred during the SARS-CoV-2 public health emergency.  Safety protocols were in place, including screening questions prior to the visit, additional usage of staff PPE, and extensive cleaning of exam room while observing appropriate contact time as indicated for disinfecting solutions.   Subjective:   Jennifer [name] is a 65 y.o. very pleasant female patient with Body mass index is 27 kg/m. who presents with the following:  She is here to follow-up about de Quervain's tenosynovitis on the right wrist.  I actually saw her about 2 weeks or so ago.  She intervally saw her primary care doctor who then referred her back to me.  I did receive a call last week and she wanted to get a de Quervain's injection.  I gave her a course of some prednisone to take, and her wrist is doing better, but she does still have some significant pain in the first dorsal compartment.  Previously, this was quite severe.  She has been wearing a good thumb spica splint for a few weeks.  Simplify rehab. - for de q. Grappler and simple finger  R de q injection  Review of Systems is noted in the HPI, as appropriate   Objective:   BP 128/80   Pulse 72   Temp 98.5 F (36.9 C) (Temporal)   Ht 5' (1.524 m)   Wt 138 lb 4 oz (62.7 kg)   SpO2 95%   BMI 27.00 kg/m    R hand Ecchymosis or edema: Modest fullness at the first dorsal compartment and in the distal wrist ROM wrist/hand/digits: full  Carpals, MCP's, digits: NT Distal Ulna and Radius: NT Ecchymosis or edema: neg No  instability Cysts/nodules: neg Digit triggering: neg Finkelstein's test: Positive Snuffbox tenderness: neg Scaphoid tubercle: NT Resisted supination: NT Full composite fist, no malrotation Grip, all digits: 4/5 str, compared to the contralateral side at 5/5 DIPJT: NT PIP JT: NT MCP JT: NT Axial load test: neg Atrophy: neg  Hand sensation: intact   Radiology: DG Wrist Complete Right  Result Date: 04/18/2021 CLINICAL DATA:  Right wrist pain near thumb, no known injury, initial encounter EXAM: RIGHT WRIST - COMPLETE 3+ VIEW COMPARISON:  11/03/2019 FINDINGS: Mild degenerative changes of the first [facility] joint are noted. No acute fracture or dislocation is seen. No soft tissue abnormality is noted. IMPRESSION: Mild degenerative change without acute abnormality. Electronically Signed   By: Jennifer [name] M.D.   On: 04/18/2021 15:39    Assessment and Plan:     ICD-10-CM   1. De Quervain's tenosynovitis, right  M65.4 triamcinolone acetonide (KENALOG-40) injection 20 mg   She is having some recalcitrant de Quervain's failing multiple other modalities including anti-inflammatories and she is still not improved on some oral steroids.  I am going to do a de Quervain's injection today.  I am going to simplify her hand and finger rehab quite a bit and only have her do a small number of exercises.  I think that this will make a difference and she can adjust intensity  levels based on pain using some small towels and some grip training in a bucket of sand, rice, or beans.  Tendon Sheath Injection Procedure Note Jennifer [name] 01-31-1956 Date of procedure: 04/28/2021  Procedure: Tendon Sheath Injection of the First Dorsal Compartment for De Quervain's Tenosynovitis, R Indications: Pain  Procedure Details Verbal consent was obtained. Risks (including rare risk of skin atrophy, risk of skin bleaching), benefits, and alternatives were discussed. Prepped with Chloraprep and Ethyl Chloride used for  anesthesia. Under sterile conditions, after tendons identified patient injected 1 1/2 cm proximal to radial styloid. Aspiration yields no blood. Decreased pain post injection. No complications. Needle size: 22 gauge Injection: 1/2 cc of Lidocaine 1% and Kenalog 20 mg Medication: 1/2 cc of Kenalog 40 mg (equaling Kenalog 20 mg)  Meds ordered this encounter  Medications  . triamcinolone acetonide (KENALOG-40) injection 20 mg   There are no discontinued medications. No orders of the defined types were placed in this encounter.   Follow-up: No follow-ups on file.  Signed,  Jennifer Deed. Anayansi Rundquist, MD   Outpatient Encounter Medications as of 04/28/2021  Medication Sig  . ALPRAZolam (XANAX) 0.5 MG tablet TAKE 1/2 TO 1 TABLET BY MOUTH ONCE DAILY AS NEEDED FOR SLEEP/ANXIETY  . atenolol (TENORMIN) 25 MG tablet Take 12.5 mg by mouth 3 (three) times daily. For migraine prevention  . CALCIUM CARBONATE-VITAMIN D PO Take 1 tablet by mouth daily. 1000 mg  . famotidine (PEPCID) 40 MG tablet Take 40 mg by mouth 2 (two) times daily.   . hydrocortisone 2.5 % cream Apply topically 2 (two) times daily as needed (Rash).  . hydrocortisone 2.5 % cream Apply to affected areas BID PRN as directed.  . hydrOXYzine (ATARAX/VISTARIL) 25 MG tablet Take 1 tablet (25 mg total) by mouth 3 (three) times daily as needed. (Patient taking differently: Take 31.25 mg by mouth 2 (two) times daily.)  . ketorolac (TORADOL) 10 MG tablet Take 1 tablet (10 mg total) by mouth every 6 (six) hours as needed for moderate pain. (Patient taking differently: Take 2.5 mg by mouth 2 (two) times a week.)  . letrozole (FEMARA) 2.5 MG tablet TAKE 1 TABLET BY MOUTH EVERY DAY  . levocetirizine (XYZAL) 5 MG tablet Take 5 mg by mouth at bedtime.   . Magnesium Oxide 400 (240 Mg) MG TABS Take 400 mg by mouth daily.   . Multiple Vitamin (MULTIVITAMIN WITH MINERALS) TABS tablet Take 1 tablet by mouth daily.  . Omega-3 Fatty Acids (FISH OIL) 1000 MG CAPS  Take 1,000 mg by mouth daily.   . OnabotulinumtoxinA (BOTOX IJ) Inject 1 Dose as directed every 6 (six) weeks.   . ondansetron (ZOFRAN ODT) 4 MG disintegrating tablet Take 1 tablet (4 mg total) by mouth every 8 (eight) hours as needed.  . pantoprazole (PROTONIX) 40 MG tablet TAKE ONE TABLET BY MOUTH ONE TIME DAILY  . predniSONE (DELTASONE) 20 MG tablet 2 tabs po for 4 days, then 1 tab po for 4 days  . PRESCRIPTION MEDICATION Inject into the skin every 6 (six) weeks. Steroid Trigger  . Probiotic Product (PROBIOTIC-10 PO) Take 1 capsule by mouth daily.   . promethazine (PHENERGAN) 25 MG tablet Take 12.5 mg by mouth daily as needed for nausea or vomiting. When having a migraine  . SYNTHROID 75 MCG tablet TAKE ONE TABLET BY MOUTH DAILY BEFORE BREAKFAST  . triamcinolone (KENALOG) 0.1 % Apply to affected areas BID PRN for up to two weeks at a time as directed.  . TURMERIC  PO Take 1 tablet by mouth daily.   . rizatriptan (MAXALT) 10 MG tablet Take 1 tablet (10 mg total) by mouth once as needed for up to 10 days for migraine. May repeat in 2 hours if needed  . [EXPIRED] triamcinolone acetonide (KENALOG-40) injection 20 mg    No facility-administered encounter medications on file as of 04/28/2021.

## 2021-04-29 ENCOUNTER — Telehealth: Payer: Self-pay | Admitting: Family Medicine

## 2021-04-29 ENCOUNTER — Encounter: Payer: Self-pay | Admitting: Family Medicine

## 2021-04-29 MED ORDER — ALPRAZOLAM 0.5 MG PO TABS
ORAL_TABLET | ORAL | 1 refills | Status: DC
Start: 2021-04-29 — End: 2021-08-04

## 2021-04-29 NOTE — Telephone Encounter (Signed)
Name of Medication:Alprazolam 0.5 mg Name of Pharmacy:[facility] Last Fill or Written Date and Quantity:11/08/20 #15 tabs with 1 refill Last Office Visit and Type:wrist pain (Dr. Lorelei Pont) on 04/28/21 Next Office Visit and Type:none scheduled

## 2021-04-29 NOTE — Telephone Encounter (Signed)
Patient saw Dr Lorelei Pont and gave her a soft brace for her wrist. Patient is requesting a second one. Please advise patient if this is possible. EM

## 2021-04-29 NOTE — Telephone Encounter (Signed)
Spoke with Jennifer [name].  She is mainly wanting a second brace so she has one to wear during the day and a clean one to wear to sleep in.  After discussing cost of brace if her insurance doesn't cover the second brace, patient decided she would check at local pharmacy to see if she could find a thumb spica brace that would be cheaper.

## 2021-04-29 NOTE — Telephone Encounter (Signed)
Her thumb spica splint looked pretty good yesterday?  She can always get another one, but her insurance might not cover it, since she got one a few weeks ago.  If they deny it, then she would have to pay for the full cost.  If easier, we can wait until tomorrow, so I can sign forms.

## 2021-05-08 ENCOUNTER — Telehealth: Payer: Self-pay

## 2021-05-08 MED ORDER — METFORMIN HCL ER 500 MG PO TB24: 500.0000 mg | ORAL_TABLET | Freq: Every day | ORAL | 3 refills | Status: DC

## 2021-05-08 NOTE — Telephone Encounter (Signed)
I sent it  Let's check a1c and bmet in 3 mo if she is ok with that

## 2021-05-08 NOTE — Telephone Encounter (Signed)
Pt called triage line stating she has decide she does want to start taking metformin er 500mg  as previously discussed. She never started taking it. Please send to [facility]. Call pt at [phone] with any questions or comments.

## 2021-05-08 NOTE — Telephone Encounter (Signed)
Insurance will usually not cover vit D level unless there is already a h/o low D Is she ok with that?  Unsure how much a D level costs I would order it for everyone if I could

## 2021-05-08 NOTE — Telephone Encounter (Signed)
Pt notified Rx sent.   Pt doesn't want to wait 3 months to have labs checked. Pt is overdue for her CPE so she said she will give med a month and she scheduled her CPE/ labs prior for next month. Pt said she wants the "works" when she gets her labs since it's for her CPE  Lab appt scheduled 06/13/21 and she said she wants her TSH, Vit D, lipid, CBC, A1c, CMP and any other labs Dr. Glori Bickers thinks she needs for her CPE.  CPE appt scheduled 06/17/21   FYI to PCP

## 2021-05-09 NOTE — Telephone Encounter (Signed)
Patient called and informed her that the insurance may not cover her Vitamin D unless there is a H/O but she stated that she has had a history of vitamin d deficiency and she had to come into the office for injections.

## 2021-05-13 ENCOUNTER — Telehealth: Payer: Self-pay

## 2021-05-13 DIAGNOSIS — M654 Radial styloid tenosynovitis [de Quervain]: Secondary | ICD-10-CM

## 2021-05-13 NOTE — Telephone Encounter (Signed)
Patient states her right wrist is still bothering her with pain. She saw Dr Lorelei Pont 04/28/21 and was given injection, and advised to wear a brace for a week which she did. The pain keeps coming back during the day mainly, she has to use her hands a lot. After nights rest pain gets better. Patient is not sure if she should be using the brace longer? She does do home exercises. Patient is frustrated because she did not think this would be lasting this long.  Patient wonders what else to do at this time. Please advise. Thank you

## 2021-05-14 MED ORDER — PREDNISONE 20 MG PO TABS: ORAL_TABLET | ORAL | 0 refills | Status: DC

## 2021-05-14 NOTE — Telephone Encounter (Signed)
Left message for Jennifer [name] to return my call.

## 2021-05-14 NOTE — Telephone Encounter (Signed)
Left message for Ms. Sookram to return my call.

## 2021-05-14 NOTE — Telephone Encounter (Signed)
Please call.  My recommendation would be to start formal hand therapy to do nonpharmacological pain control measures.  Additionally, they can help fine-tune rehabilitation of the hand.  If she is agreeable, I will set up this consult.  Also sent her and then additional rounds of oral prednisone.   (Note to self: I have seen the patient twice, initially 1 month ago.)    Meds ordered this encounter  Medications   predniSONE (DELTASONE) 20 MG tablet    Sig: 2 tabs po daily for 5 days, then 1 tab po daily for 5 days    Dispense:  15 tablet    Refill:  0

## 2021-05-15 DIAGNOSIS — G43019 Migraine without aura, intractable, without status migrainosus: Secondary | ICD-10-CM | POA: Diagnosis not present

## 2021-05-15 DIAGNOSIS — M542 Cervicalgia: Secondary | ICD-10-CM | POA: Diagnosis not present

## 2021-05-15 DIAGNOSIS — G43719 Chronic migraine without aura, intractable, without status migrainosus: Secondary | ICD-10-CM | POA: Diagnosis not present

## 2021-05-15 NOTE — Telephone Encounter (Signed)
Jennifer [name] notified as instructed by telephone.  She is agreeable to formal PT.  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  She is inquiring if she needs to keep wearing the brace.  Please advise if so and when she should be wearing the brace.

## 2021-05-15 NOTE — Telephone Encounter (Signed)
Mrs. Reader notified as instructed by telephone.

## 2021-05-15 NOTE — Addendum Note (Signed)
Addended by: Owens Loffler on: 05/15/2021 01:07 PM   Modules accepted: Orders

## 2021-05-15 NOTE — Telephone Encounter (Signed)
Ok to not wear it at night.  For the next week, wear during week. After then, titrate out of brace over the next 10-14 days.  Office will call about PT appointment     ICD-10-CM   1. De Quervain's tenosynovitis, right  M65.4 Ambulatory referral to Physical Therapy      Meds ordered this encounter  Medications   predniSONE (DELTASONE) 20 MG tablet    Sig: 2 tabs po daily for 5 days, then 1 tab po daily for 5 days    Dispense:  15 tablet    Refill:  0   Orders Placed This Encounter  Procedures   Ambulatory referral to Physical Therapy

## 2021-05-21 ENCOUNTER — Other Ambulatory Visit: Payer: Self-pay | Admitting: Family Medicine

## 2021-06-09 DIAGNOSIS — G43019 Migraine without aura, intractable, without status migrainosus: Secondary | ICD-10-CM | POA: Diagnosis not present

## 2021-06-09 DIAGNOSIS — M542 Cervicalgia: Secondary | ICD-10-CM | POA: Diagnosis not present

## 2021-06-09 DIAGNOSIS — G43719 Chronic migraine without aura, intractable, without status migrainosus: Secondary | ICD-10-CM | POA: Diagnosis not present

## 2021-06-12 ENCOUNTER — Telehealth: Payer: Self-pay | Admitting: Family Medicine

## 2021-06-12 DIAGNOSIS — Z0000 Encounter for general adult medical examination without abnormal findings: Secondary | ICD-10-CM

## 2021-06-12 DIAGNOSIS — E039 Hypothyroidism, unspecified: Secondary | ICD-10-CM

## 2021-06-12 DIAGNOSIS — R7303 Prediabetes: Secondary | ICD-10-CM

## 2021-06-12 DIAGNOSIS — E538 Deficiency of other specified B group vitamins: Secondary | ICD-10-CM

## 2021-06-12 DIAGNOSIS — D649 Anemia, unspecified: Secondary | ICD-10-CM

## 2021-06-12 NOTE — Telephone Encounter (Signed)
-----   Message from Cloyd Stagers, RT sent at 06/02/2021  8:37 AM EDT ----- Regarding: Lab Orders for Friday 7.22.2022 Please place lab orders for Friday 7.22.2022, office visit for physical on Tuesday 7.26.2022 Thank you, Dyke Maes RT(R)

## 2021-06-13 ENCOUNTER — Other Ambulatory Visit: Payer: Self-pay

## 2021-06-13 ENCOUNTER — Other Ambulatory Visit (INDEPENDENT_AMBULATORY_CARE_PROVIDER_SITE_OTHER): Payer: BC Managed Care – PPO

## 2021-06-13 DIAGNOSIS — R7303 Prediabetes: Secondary | ICD-10-CM | POA: Diagnosis not present

## 2021-06-13 DIAGNOSIS — Z0000 Encounter for general adult medical examination without abnormal findings: Secondary | ICD-10-CM

## 2021-06-13 DIAGNOSIS — D649 Anemia, unspecified: Secondary | ICD-10-CM

## 2021-06-13 DIAGNOSIS — E538 Deficiency of other specified B group vitamins: Secondary | ICD-10-CM | POA: Diagnosis not present

## 2021-06-13 DIAGNOSIS — E039 Hypothyroidism, unspecified: Secondary | ICD-10-CM

## 2021-06-13 LAB — CBC WITH DIFFERENTIAL/PLATELET
Basophils Absolute: 0 10*3/uL (ref 0.0–0.1)
Basophils Relative: 0.8 % (ref 0.0–3.0)
Eosinophils Absolute: 0.2 10*3/uL (ref 0.0–0.7)
Eosinophils Relative: 3.5 % (ref 0.0–5.0)
HCT: 34.1 % — ABNORMAL LOW (ref 36.0–46.0)
Hemoglobin: 10.9 g/dL — ABNORMAL LOW (ref 12.0–15.0)
Lymphocytes Relative: 33.3 % (ref 12.0–46.0)
Lymphs Abs: 1.9 10*3/uL (ref 0.7–4.0)
MCHC: 32.1 g/dL (ref 30.0–36.0)
MCV: 75.4 fl — ABNORMAL LOW (ref 78.0–100.0)
Monocytes Absolute: 0.6 10*3/uL (ref 0.1–1.0)
Monocytes Relative: 11 % (ref 3.0–12.0)
Neutro Abs: 2.9 10*3/uL (ref 1.4–7.7)
Neutrophils Relative %: 51.4 % (ref 43.0–77.0)
Platelets: 352 10*3/uL (ref 150.0–400.0)
RBC: 4.52 Mil/uL (ref 3.87–5.11)
RDW: 17 % — ABNORMAL HIGH (ref 11.5–15.5)
WBC: 5.6 10*3/uL (ref 4.0–10.5)

## 2021-06-13 LAB — COMPREHENSIVE METABOLIC PANEL
ALT: 18 U/L (ref 0–35)
AST: 15 U/L (ref 0–37)
Albumin: 4.1 g/dL (ref 3.5–5.2)
Alkaline Phosphatase: 116 U/L (ref 39–117)
BUN: 14 mg/dL (ref 6–23)
CO2: 29 mEq/L (ref 19–32)
Calcium: 9.4 mg/dL (ref 8.4–10.5)
Chloride: 101 mEq/L (ref 96–112)
Creatinine, Ser: 0.74 mg/dL (ref 0.40–1.20)
GFR: 85.34 mL/min (ref >60.00)
Glucose, Bld: 98 mg/dL (ref 70–99)
Potassium: 4.7 mEq/L (ref 3.5–5.1)
Sodium: 137 mEq/L (ref 135–145)
Total Bilirubin: 0.4 mg/dL (ref 0.2–1.2)
Total Protein: 6.7 g/dL (ref 6.0–8.3)

## 2021-06-13 LAB — VITAMIN B12: Vitamin B-12: 428 pg/mL (ref 211–911)

## 2021-06-13 LAB — LIPID PANEL
Cholesterol: 207 mg/dL — ABNORMAL HIGH (ref 0–200)
HDL: 66.2 mg/dL (ref >39.00)
LDL Cholesterol: 119 mg/dL — ABNORMAL HIGH (ref 0–99)
NonHDL: 140.84
Total CHOL/HDL Ratio: 3
Triglycerides: 111 mg/dL (ref 0.0–149.0)
VLDL: 22.2 mg/dL (ref 0.0–40.0)

## 2021-06-13 LAB — TSH: TSH: 3.49 u[IU]/mL (ref 0.35–5.50)

## 2021-06-13 LAB — IRON: Iron: 25 ug/dL — ABNORMAL LOW (ref 42–145)

## 2021-06-13 LAB — FERRITIN: Ferritin: 3.6 ng/mL — ABNORMAL LOW (ref 10.0–291.0)

## 2021-06-13 LAB — HEMOGLOBIN A1C: Hgb A1c MFr Bld: 6.5 % (ref 4.6–6.5)

## 2021-06-16 DIAGNOSIS — G43719 Chronic migraine without aura, intractable, without status migrainosus: Secondary | ICD-10-CM | POA: Diagnosis not present

## 2021-06-17 ENCOUNTER — Ambulatory Visit (INDEPENDENT_AMBULATORY_CARE_PROVIDER_SITE_OTHER): Payer: BC Managed Care – PPO | Admitting: Family Medicine

## 2021-06-17 ENCOUNTER — Encounter: Payer: Self-pay | Admitting: Family Medicine

## 2021-06-17 ENCOUNTER — Other Ambulatory Visit: Payer: Self-pay

## 2021-06-17 VITALS — BP 110/60 | HR 77 | Temp 98.0°F | Ht 60.25 in | Wt 136.5 lb

## 2021-06-17 DIAGNOSIS — Z0000 Encounter for general adult medical examination without abnormal findings: Secondary | ICD-10-CM

## 2021-06-17 DIAGNOSIS — D509 Iron deficiency anemia, unspecified: Secondary | ICD-10-CM

## 2021-06-17 DIAGNOSIS — Z853 Personal history of malignant neoplasm of breast: Secondary | ICD-10-CM

## 2021-06-17 DIAGNOSIS — E538 Deficiency of other specified B group vitamins: Secondary | ICD-10-CM

## 2021-06-17 DIAGNOSIS — M8580 Other specified disorders of bone density and structure, unspecified site: Secondary | ICD-10-CM

## 2021-06-17 DIAGNOSIS — E039 Hypothyroidism, unspecified: Secondary | ICD-10-CM

## 2021-06-17 DIAGNOSIS — R7303 Prediabetes: Secondary | ICD-10-CM

## 2021-06-17 MED ORDER — POLYSACCHARIDE IRON COMPLEX 150 MG PO CAPS: 150.0000 mg | ORAL_CAPSULE | Freq: Every day | ORAL | 3 refills | Status: DC

## 2021-06-17 NOTE — Patient Instructions (Addendum)
Please try to establish an exercise program (for bones and mood and general health and weight)   Start niferex 150 mg for iron daily  Stool softener if needed  Let's re check labs in 1 month Also ref to GI-you will get a call   Get back on metformin xr 500 mg daily  This will help you loose weight  Is you have side effects stop it and let me know  Eat a low glycemic diet   Follow up in 3 months

## 2021-06-17 NOTE — Progress Notes (Signed)
Subjective:    Patient ID: Jennifer [name], female    DOB: 12/30/1955, 65 y.o.   MRN: [id number]  This visit occurred during the SARS-CoV-2 public health emergency.  Safety protocols were in place, including screening questions prior to the visit, additional usage of staff PPE, and extensive cleaning of exam room while observing appropriate contact time as indicated for disinfecting solutions.   HPI Here for health maintenance exam and to review chronic medical problems    Wt Readings from Last 3 Encounters:  06/17/21 136 lb 8 oz (61.9 kg)  04/28/21 138 lb 4 oz (62.7 kg)  04/18/21 138 lb 5 oz (62.7 kg)   26.44 kg/m  Taking care of herself  Nutrition wise could do better  Tries to cut calories and then gets a migraine Not exercising   Has a lot of options with equip and weights  Just has not started     Flu shot-fall Pna vaccine 11/21 pna 23 Tdap 12/13 Shingrix-done Covid -imm with booster- 2nd booster Saturday -made her sick (ached all over)  Took some prednisone   Pap 12/19 -negative  Still goes to gyn  Next visit is set   Mammogram 10/21 Personal h/o breast cancer (was worried about lymphedema)  Taking letrozole Self breast exam -no lumps   Colonoscopy 12/19   Dexa 10/20 osteopenia - has one set for oct  Done at [facility] - one when getting out of bed on migraine med- rolled off bed , no injuries  Fractures -none   Hypothyroidism  Pt has no clinical changes No change in energy level/ hair or skin/ edema and no tremor Lab Results  Component Value Date   TSH 3.49 06/13/2021    Takes 75 mcg levothyroxine daily    Anemia  Lab Results  Component Value Date   WBC 5.6 06/13/2021   HGB 10.9 (L) 06/13/2021   HCT 34.1 (L) 06/13/2021   MCV 75.4 (L) 06/13/2021   PLT 352.0 06/13/2021   Lab Results  Component Value Date   IRON 25 (L) 06/13/2021   FERRITIN 3.6 (L) 06/13/2021   Has been on ppi and H2 for a long time  No blood in stool  No recent surgeries   Has been tired     Lab Results  Component Value Date   VITAMINB12 428 06/13/2021   Prediabetes Lab Results  Component Value Date   HGBA1C 6.5 06/13/2021   Takes metformin xr 500 mg daily - she never started taking it actually   Lab Results  Component Value Date   CREATININE 0.74 06/13/2021   BUN 14 06/13/2021   NA 137 06/13/2021   K 4.7 06/13/2021   CL 101 06/13/2021   CO2 29 06/13/2021   Lab Results  Component Value Date   ALT 18 06/13/2021   AST 15 06/13/2021   ALKPHOS 116 06/13/2021   BILITOT 0.4 06/13/2021   Cholesterol  Lab Results  Component Value Date   CHOL 207 (H) 06/13/2021   CHOL 202 (H) 02/26/2020   CHOL 198 12/30/2018   Lab Results  Component Value Date   HDL 66.20 06/13/2021   HDL 59.90 02/26/2020   HDL 60.90 12/30/2018   Lab Results  Component Value Date   LDLCALC 119 (H) 06/13/2021   LDLCALC 108 (H) 02/26/2020   LDLCALC 122 (H) 12/30/2018   Lab Results  Component Value Date   TRIG 111.0 06/13/2021   TRIG 169.0 (H) 02/26/2020   TRIG 74.0 12/30/2018   Lab Results  Component Value Date   CHOLHDL 3 06/13/2021   CHOLHDL 3 02/26/2020   CHOLHDL 3 12/30/2018   Lab Results  Component Value Date   LDLDIRECT 98.6 01/10/2014   LDLDIRECT 121.8 09/16/2010    Patient Active Problem List   Diagnosis Date Noted   Right wrist pain 04/18/2021   De Quervain's tenosynovitis, right 04/18/2021   Iron deficiency anemia 09/08/2020   Common bile duct dilatation 09/06/2020   Opacity of lung on imaging study 09/06/2020   Weakness of right foot 07/10/2020   Dizziness 07/10/2020   History of breast cancer 03/01/2020   Genetic testing 11/14/2019   Bilateral hand pain 11/03/2019   Family history of prostate cancer    Adnexal cyst 09/26/2019   Use of anastrozole (Arimidex) 03/21/2019   Family history of breast cancer    Stress reaction 09/26/2018   Malignant neoplasm of upper-inner quadrant of right breast in female, estrogen receptor positive  ([place]) 09/20/2018   B12 deficiency 04/13/2018   Joint pain 04/11/2018   Colon cancer screening 09/16/2017   Prediabetes 07/28/2016   Allergic rhinitis 11/13/2014   Screening for lipoid disorders 09/26/2014   Preventative health care 09/26/2014   Dysuria 08/07/2014   Routine general medical examination at a health care facility 10/28/2012   Herpes labialis 04/28/2011   GERD 09/16/2010   Hypothyroidism 05/07/2008   Osteopenia 05/07/2008   Migraine with aura 11/11/2007   Past Medical History:  Diagnosis Date   Allergic rhinitis    Anxiety    Arthritis    hands, knees   Breast cancer ([place]) 2019   Right Breast Cancer   Cancer ([place]) 09/2018   Breast CA new DX, right breast   Cervical dysplasia    Common bile duct dilation    Endometriosis    Esophageal reflux    Family history of adverse reaction to anesthesia    younger sister anxiety for a dew days after   Family history of breast cancer    Family history of breast cancer    Family history of prostate cancer    Hepatic cyst 08/2020   multiple   Hepatic hemangioma 08/2020   intrahepatic hemangioma   Hiatal hernia    Hypothyroid    Migraine with aura, without mention of intractable migraine without mention of status migrainosus    Osteopenia 08/2019   T score -2.2 FRAX 11% / 1.7%   Personal history of radiation therapy 2019   Right Breast Cancer   Pre-diabetes    Past Surgical History:  Procedure Laterality Date   BIOPSY  09/12/2020   Procedure: BIOPSY;  Surgeon: [name], MD;  Location: [facility];  Service: Endoscopy;;   BREAST EXCISIONAL BIOPSY Bilateral over 10 years ago   benign x 2    BREAST LUMPECTOMY Right 10/10/2018   BREAST LUMPECTOMY WITH RADIOACTIVE SEED AND SENTINEL LYMPH NODE BIOPSY Right 10/10/2018   Procedure: RIGHT BREAST LUMPECTOMY WITH RADIOACTIVE SEED AND RIGHT SENTINEL LYMPH NODE BIOPSY;  Surgeon: [name], MD;  Location: [place];  Service: General;  Laterality:  Right;   BREAST SURGERY     Benign breast lump excised   CERVICAL CONE BIOPSY  1985   severe dysplasia   COLONOSCOPY  2005 and dec 2019   normal   COLPOSCOPY     endoscopy  10/2018   ESOPHAGOGASTRODUODENOSCOPY (EGD) WITH PROPOFOL N/A 09/12/2020   Procedure: ESOPHAGOGASTRODUODENOSCOPY (EGD) WITH PROPOFOL;  Surgeon: [name], MD;  Location: [facility];  Service: Endoscopy;  Laterality: N/A;   EUS N/A 09/12/2020   Procedure: UPPER ENDOSCOPIC ULTRASOUND (EUS) RADIAL;  Surgeon: [name], MD;  Location: [facility];  Service: Endoscopy;  Laterality: N/A;   LAPAROSCOPIC ASSISTED VAGINAL HYSTERECTOMY     LAPAROSCOPIC BILATERAL SALPINGO OOPHERECTOMY Bilateral 10/27/2019   Procedure: LAPAROSCOPIC BILATERAL SALPINGO OOPHORECTOMY WITH PERITONEAL WASHINGS;  Surgeon: Princess Bruins, MD;  Location: [place];  Service: Gynecology;  Laterality: Bilateral;  request 1:00pm on Friday, Dec. 4th in [place] time held requests one hour OR time   moles removed from upper back, face lft, 1 between breasts, upper leg left inside  10/18/2019   wearing small round bandaids on for 2 weeks   ROTATOR CUFF REPAIR Bilateral 08/2008   Social History   Tobacco Use   Smoking status: Never   Smokeless tobacco: Never  Vaping Use   Vaping Use: Never used  Substance Use Topics   Alcohol use: No    Alcohol/week: 0.0 standard drinks   Drug use: No   Family History  Problem Relation Age of Onset   Breast cancer Mother 13   Diabetes Mother    Hypertension Father    Heart disease Father    Osteoporosis Maternal Grandmother    Diabetes Maternal Grandmother    Hypertension Sister    COPD Paternal Grandmother    Heart disease Paternal Grandfather    Breast cancer Cousin 70       pat first cousin   Breast cancer Cousin        mat second cousin with breast cancer in her 76s   Prostate cancer Other 61   Thyroid cancer Cousin 14       pat first cousin   Colon  cancer Neg Hx    Stomach cancer Neg Hx    Esophageal cancer Neg Hx    Pancreatic cancer Neg Hx    Allergies  Allergen Reactions   Ciprofloxacin Other (See Comments)    Dizziness    Codeine Phosphate Other (See Comments)    Dizziness, heart palpitations, nervous   Decongestant [Pseudoephedrine] Other (See Comments)    dizziness   Flonase [Fluticasone Propionate] Other (See Comments)    Worsens her migraine    Hydrocodone Other (See Comments)    Made nervous--10/09 surgery rotator cuff   Macrobid [Nitrofurantoin]     headaches   Reglan [Metoclopramide] Other (See Comments)    Dizziness   Topamax [Topiramate] Other (See Comments)    Dizziness    Benadryl [Diphenhydramine Hcl] Palpitations   Sulfa Antibiotics Anxiety   Current Outpatient Medications on File Prior to Visit  Medication Sig Dispense Refill   ALPRAZolam (XANAX) 0.5 MG tablet TAKE 1/2 TO 1 TABLET BY MOUTH ONCE DAILY AS NEEDED FOR SLEEP/ANXIETY 15 tablet 1   atenolol (TENORMIN) 25 MG tablet Take 12.5 mg by mouth 3 (three) times daily. For migraine prevention     CALCIUM CARBONATE-VITAMIN D PO Take 1 tablet by mouth daily. 1000 mg     famotidine (PEPCID) 40 MG tablet Take 40 mg by mouth 2 (two) times daily.      hydrocortisone 2.5 % cream Apply to affected areas BID PRN as directed. 30 g 3   hydrOXYzine (ATARAX/VISTARIL) 25 MG tablet Take 1 tablet (25 mg total) by mouth 3 (three) times daily as needed. (Patient taking differently: Take 31.25 mg by mouth 2 (two) times daily.) 30 tablet 0   ketorolac (TORADOL) 10 MG tablet Take 1 tablet (10 mg total) by mouth every  6 (six) hours as needed for moderate pain. (Patient taking differently: Take 2.5 mg by mouth 2 (two) times a week.) 12 tablet 0   letrozole (FEMARA) 2.5 MG tablet TAKE 1 TABLET BY MOUTH EVERY DAY 90 tablet 3   levocetirizine (XYZAL) 5 MG tablet Take 5 mg by mouth at bedtime.   3   Magnesium Oxide 400 (240 Mg) MG TABS Take 400 mg by mouth daily.      metFORMIN  (GLUCOPHAGE XR) 500 MG 24 hr tablet Take 1 tablet (500 mg total) by mouth daily with breakfast. 90 tablet 3   Multiple Vitamin (MULTIVITAMIN WITH MINERALS) TABS tablet Take 1 tablet by mouth daily.     Omega-3 Fatty Acids (FISH OIL) 1000 MG CAPS Take 1,000 mg by mouth daily.      OnabotulinumtoxinA (BOTOX IJ) Inject 1 Dose as directed every 6 (six) weeks.      ondansetron (ZOFRAN ODT) 4 MG disintegrating tablet Take 1 tablet (4 mg total) by mouth every 8 (eight) hours as needed. 20 tablet 0   pantoprazole (PROTONIX) 40 MG tablet TAKE ONE TABLET BY MOUTH ONE TIME DAILY 90 tablet 0   PRESCRIPTION MEDICATION Inject into the skin every 6 (six) weeks. Steroid Trigger     Probiotic Product (PROBIOTIC-10 PO) Take 1 capsule by mouth daily.      promethazine (PHENERGAN) 25 MG tablet Take 12.5 mg by mouth daily as needed for nausea or vomiting. When having a migraine     SYNTHROID 75 MCG tablet TAKE ONE TABLET BY MOUTH DAILY BEFORE BREAKFAST 30 tablet 2   triamcinolone (KENALOG) 0.1 % Apply to affected areas BID PRN for up to two weeks at a time as directed. 45 g 3   TURMERIC PO Take 1 tablet by mouth daily.      rizatriptan (MAXALT) 10 MG tablet Take 1 tablet (10 mg total) by mouth once as needed for up to 10 days for migraine. May repeat in 2 hours if needed 10 tablet 0   No current facility-administered medications on file prior to visit.     Review of Systems  Constitutional:  Positive for fatigue. Negative for activity change, appetite change, fever and unexpected weight change.  HENT:  Negative for congestion, ear pain, rhinorrhea, sinus pressure and sore throat.   Eyes:  Negative for pain, redness and visual disturbance.  Respiratory:  Negative for cough, shortness of breath and wheezing.   Cardiovascular:  Negative for chest pain and palpitations.  Gastrointestinal:  Negative for abdominal pain, blood in stool, constipation and diarrhea.  Endocrine: Negative for polydipsia and polyuria.   Genitourinary:  Negative for dysuria, frequency and urgency.  Musculoskeletal:  Positive for arthralgias. Negative for back pain and myalgias.  Skin:  Negative for pallor and rash.  Allergic/Immunologic: Negative for environmental allergies.  Neurological:  Negative for dizziness, syncope and headaches.  Hematological:  Negative for adenopathy. Does not bruise/bleed easily.  Psychiatric/Behavioral:  Negative for decreased concentration and dysphoric mood. The patient is not nervous/anxious.        Decreased motivation       Objective:   Physical Exam Constitutional:      General: She is not in acute distress.    Appearance: Normal appearance. She is well-developed and normal weight. She is not ill-appearing or diaphoretic.  HENT:     Head: Normocephalic and atraumatic.     Right Ear: Tympanic membrane, ear canal and external ear normal.     Left Ear: Tympanic membrane, ear canal  and external ear normal.     Nose: Nose normal. No congestion.     Mouth/Throat:     Mouth: Mucous membranes are moist.     Pharynx: Oropharynx is clear. No posterior oropharyngeal erythema.  Eyes:     General: No scleral icterus.    Extraocular Movements: Extraocular movements intact.     Conjunctiva/sclera: Conjunctivae normal.     Pupils: Pupils are equal, round, and reactive to light.  Neck:     Thyroid: No thyromegaly.     Vascular: No carotid bruit or JVD.  Cardiovascular:     Rate and Rhythm: Normal rate and regular rhythm.     Pulses: Normal pulses.     Heart sounds: Normal heart sounds.    No gallop.  Pulmonary:     Effort: Pulmonary effort is normal. No respiratory distress.     Breath sounds: Normal breath sounds. No wheezing.     Comments: Good air exch Chest:     Chest wall: No tenderness.  Abdominal:     General: Bowel sounds are normal. There is no distension or abdominal bruit.     Palpations: Abdomen is soft. There is no mass.     Tenderness: There is no abdominal tenderness.      Hernia: No hernia is present.  Genitourinary:    Comments: Breast exam: No mass, nodules, thickening, tenderness, bulging, retraction, inflamation, nipple discharge or skin changes noted.  No axillary or clavicular LA.     Baseline surgical changes in R breast Musculoskeletal:        General: No tenderness. Normal range of motion.     Cervical back: Normal range of motion and neck supple. No rigidity. No muscular tenderness.     Right lower leg: No edema.     Left lower leg: No edema.  Lymphadenopathy:     Cervical: No cervical adenopathy.  Skin:    General: Skin is warm and dry.     Coloration: Skin is not pale.     Findings: No erythema or rash.     Comments: Some lentigines  Neurological:     Mental Status: She is alert. Mental status is at baseline.     Cranial Nerves: No cranial nerve deficit.     Motor: No abnormal muscle tone.     Coordination: Coordination normal.     Gait: Gait normal.     Deep Tendon Reflexes: Reflexes are normal and symmetric. Reflexes normal.  Psychiatric:        Mood and Affect: Mood normal.        Cognition and Memory: Cognition and memory normal.          Assessment & Plan:   Problem List Items Addressed This Visit       Endocrine   Hypothyroidism    Hypothyroidism  Pt has no clinical changes No change in energy level/ hair or skin/ edema and no tremor Lab Results  Component Value Date   TSH 3.49 06/13/2021    Plan to continue levothyroxine 75 mcg daily        Musculoskeletal and Integument   Osteopenia    dexa 10/20 -has next one sched for oct One fall from bed  No fractures  Taking ca and D  Disc need for calcium/ vitamin D/ wt bearing exercise and bone density test every 2 y to monitor Disc safety/ fracture risk in detail           Other   Routine general medical examination  at a health care facility - Primary    Reviewed health habits including diet and exercise and skin cancer prevention Reviewed appropriate  screening tests for age  Also reviewed health mt list, fam hx and immunization status , as well as social and family history   See HPI Labs reviewed Enc flu shot in the fall  covid immunized  Sees gyn and pap utd Mammogram and onc f/u utd Colonoscopy utd (but ref to gi for anemia as well) dexa sched for oct , no fractures and enc more exercise        Prediabetes    Lab Results  Component Value Date   HGBA1C 6.5 06/13/2021  disc imp of low glycemic diet and wt loss to prevent DM2  Enc strongly to start metformin xr and alert [place] if any side effects F/u 3 mo       B12 deficiency    Lab Results  Component Value Date   VITAMINB12 428 06/13/2021  Plan to continue current oral supplementation       History of breast cancer    Doing well with onc f/u Mammogram planned for oct  Letrozole makes it very difficult for her to loose and mt wt        Iron deficiency anemia    On ppi and H2 and unable to come off  Iron is low  Colonoscopy is utd but I still want her to check in with gi to r/o GI source of bleeding  Px niferex 150 mg daily  Lab 1 mo       Relevant Medications   iron polysaccharides (NIFEREX) 150 MG capsule   Other Relevant Orders   Ambulatory referral to Gastroenterology

## 2021-06-18 NOTE — Assessment & Plan Note (Signed)
On ppi and H2 and unable to come off  Iron is low  Colonoscopy is utd but I still want her to check in with gi to r/o GI source of bleeding  Px niferex 150 mg daily  Lab 1 mo

## 2021-06-18 NOTE — Assessment & Plan Note (Signed)
Reviewed health habits including diet and exercise and skin cancer prevention Reviewed appropriate screening tests for age  Also reviewed health mt list, fam hx and immunization status , as well as social and family history   See HPI Labs reviewed Enc flu shot in the fall  covid immunized  Sees gyn and pap utd Mammogram and onc f/u utd Colonoscopy utd (but ref to gi for anemia as well) dexa sched for oct , no fractures and enc more exercise

## 2021-06-18 NOTE — Assessment & Plan Note (Signed)
Hypothyroidism  Pt has no clinical changes No change in energy level/ hair or skin/ edema and no tremor Lab Results  Component Value Date   TSH 3.49 06/13/2021    Plan to continue levothyroxine 75 mcg daily

## 2021-06-18 NOTE — Assessment & Plan Note (Signed)
Lab Results  Component Value Date   HGBA1C 6.5 06/13/2021   disc imp of low glycemic diet and wt loss to prevent DM2  Enc strongly to start metformin xr and alert [place] if any side effects F/u 3 mo

## 2021-06-18 NOTE — Assessment & Plan Note (Signed)
Doing well with onc f/u Mammogram planned for oct  Letrozole makes it very difficult for her to loose and mt wt

## 2021-06-18 NOTE — Assessment & Plan Note (Signed)
Lab Results  Component Value Date   VITAMINB12 428 06/13/2021   Plan to continue current oral supplementation

## 2021-06-18 NOTE — Assessment & Plan Note (Signed)
dexa 10/20 -has next one sched for oct One fall from bed  No fractures  Taking ca and D  Disc need for calcium/ vitamin D/ wt bearing exercise and bone density test every 2 y to monitor Disc safety/ fracture risk in detail

## 2021-06-22 ENCOUNTER — Other Ambulatory Visit: Payer: Self-pay

## 2021-06-22 ENCOUNTER — Encounter: Payer: Self-pay | Admitting: Emergency Medicine

## 2021-06-22 ENCOUNTER — Emergency Department
Admission: EM | Admit: 2021-06-22 | Discharge: 2021-06-22 | Disposition: A | Discharge status: Home / Self Care | Payer: BC Managed Care – PPO | Attending: Emergency Medicine | Admitting: Emergency Medicine

## 2021-06-22 DIAGNOSIS — G43019 Migraine without aura, intractable, without status migrainosus: Secondary | ICD-10-CM | POA: Diagnosis not present

## 2021-06-22 DIAGNOSIS — R519 Headache, unspecified: Secondary | ICD-10-CM | POA: Diagnosis not present

## 2021-06-22 DIAGNOSIS — E039 Hypothyroidism, unspecified: Secondary | ICD-10-CM | POA: Insufficient documentation

## 2021-06-22 DIAGNOSIS — Z853 Personal history of malignant neoplasm of breast: Secondary | ICD-10-CM | POA: Insufficient documentation

## 2021-06-22 DIAGNOSIS — Z79899 Other long term (current) drug therapy: Secondary | ICD-10-CM | POA: Diagnosis not present

## 2021-06-22 MED ORDER — DEXAMETHASONE SODIUM PHOSPHATE 10 MG/ML IJ SOLN
10.0000 mg | Freq: Once | INTRAMUSCULAR | Status: AC
  Administered 2021-06-22: 10 mg via INTRAVENOUS
  Filled 2021-06-22: qty 1

## 2021-06-22 MED ORDER — KETOROLAC TROMETHAMINE 30 MG/ML IJ SOLN
15.0000 mg | Freq: Once | INTRAMUSCULAR | Status: AC
  Administered 2021-06-22: 15 mg via INTRAVENOUS
  Filled 2021-06-22: qty 1

## 2021-06-22 MED ORDER — ONDANSETRON HCL 4 MG/2ML IJ SOLN
INTRAMUSCULAR | Status: AC
  Filled 2021-06-22: qty 2

## 2021-06-22 MED ORDER — DEXAMETHASONE SODIUM PHOSPHATE 10 MG/ML IJ SOLN
INTRAMUSCULAR | Status: AC
  Filled 2021-06-22: qty 1

## 2021-06-22 MED ORDER — KETOROLAC TROMETHAMINE 30 MG/ML IJ SOLN
INTRAMUSCULAR | Status: AC
  Filled 2021-06-22: qty 1

## 2021-06-22 MED ORDER — SODIUM CHLORIDE 0.9 % IV BOLUS
1000.0000 mL | Freq: Once | INTRAVENOUS | Status: AC
  Administered 2021-06-22: 1000 mL via INTRAVENOUS

## 2021-06-22 MED ORDER — ONDANSETRON HCL 4 MG/2ML IJ SOLN
4.0000 mg | Freq: Once | INTRAMUSCULAR | Status: AC
  Administered 2021-06-22: 4 mg via INTRAVENOUS
  Filled 2021-06-22: qty 2

## 2021-06-22 NOTE — Discharge Instructions (Addendum)
Please follow up with primary care or your headache specialist for more frequent headaches.  Return to the ER for symptoms that change or worsen if unable to schedule an appointment.

## 2021-06-22 NOTE — ED Triage Notes (Signed)
Pt to ED via POV, pt states that she has had migraine since she woke up this morning. Pt states that she took migraine medication but it has not helped. Pt has also taken zofran. Pt states that she has hx/o Migraines. Pt is in NAD.

## 2021-06-22 NOTE — ED Provider Notes (Signed)
[facility] Emergency Department Provider Note ____________________________________________  Time seen: Approximately 1:56 PM  I have reviewed the triage vital signs and the nursing notes.   HISTORY  Chief Complaint Migraine   HPI Jennifer [name] is a 65 y.o. female presents to the emergency department for treatment of migraine. Current symptoms similar to previous.   Location: Behind eyes Similar to previous headaches: yes Duration: Constant TIMING:7:15am SEVERITY:6/10 QUALITY:Throbbing CONTEXT:No known exposure MODIFYING FACTORS: No relief with triptan ASSOCIATED SYMPTOMS: nausea and vomiting. Past Medical History:  Diagnosis Date   Allergic rhinitis    Anxiety    Arthritis    hands, knees   Breast cancer ([place]) 2019   Right Breast Cancer   Cancer ([place]) 09/2018   Breast CA new DX, right breast   Cervical dysplasia    Common bile duct dilation    Endometriosis    Esophageal reflux    Family history of adverse reaction to anesthesia    younger sister anxiety for a dew days after   Family history of breast cancer    Family history of breast cancer    Family history of prostate cancer    Hepatic cyst 08/2020   multiple   Hepatic hemangioma 08/2020   intrahepatic hemangioma   Hiatal hernia    Hypothyroid    Migraine with aura, without mention of intractable migraine without mention of status migrainosus    Osteopenia 08/2019   T score -2.2 FRAX 11% / 1.7%   Personal history of radiation therapy 2019   Right Breast Cancer   Pre-diabetes     Patient Active Problem List   Diagnosis Date Noted   Right wrist pain 04/18/2021   De Quervain's tenosynovitis, right 04/18/2021   Iron deficiency anemia 09/08/2020   Common bile duct dilatation 09/06/2020   Opacity of lung on imaging study 09/06/2020   Weakness of right foot 07/10/2020   Dizziness 07/10/2020   History of breast cancer 03/01/2020   Genetic testing 11/14/2019   Bilateral hand pain  11/03/2019   Family history of prostate cancer    Adnexal cyst 09/26/2019   Use of anastrozole (Arimidex) 03/21/2019   Family history of breast cancer    Stress reaction 09/26/2018   Malignant neoplasm of upper-inner quadrant of right breast in female, estrogen receptor positive ([place]) 09/20/2018   B12 deficiency 04/13/2018   Joint pain 04/11/2018   Colon cancer screening 09/16/2017   Prediabetes 07/28/2016   Allergic rhinitis 11/13/2014   Screening for lipoid disorders 09/26/2014   Preventative health care 09/26/2014   Dysuria 08/07/2014   Routine general medical examination at a health care facility 10/28/2012   Herpes labialis 04/28/2011   GERD 09/16/2010   Hypothyroidism 05/07/2008   Osteopenia 05/07/2008   Migraine with aura 11/11/2007    Past Surgical History:  Procedure Laterality Date   BIOPSY  09/12/2020   Procedure: BIOPSY;  Surgeon: [name], MD;  Location: [facility];  Service: Endoscopy;;   BREAST EXCISIONAL BIOPSY Bilateral over 10 years ago   benign x 2    BREAST LUMPECTOMY Right 10/10/2018   BREAST LUMPECTOMY WITH RADIOACTIVE SEED AND SENTINEL LYMPH NODE BIOPSY Right 10/10/2018   Procedure: RIGHT BREAST LUMPECTOMY WITH RADIOACTIVE SEED AND RIGHT SENTINEL LYMPH NODE BIOPSY;  Surgeon: Jovita Kussmaul, MD;  Location: [facility];  Service: General;  Laterality: Right;   BREAST SURGERY     Benign breast lump excised   CERVICAL CONE BIOPSY  1985   severe dysplasia  COLONOSCOPY  2005 and dec 2019   normal   COLPOSCOPY     endoscopy  10/2018   ESOPHAGOGASTRODUODENOSCOPY (EGD) WITH PROPOFOL N/A 09/12/2020   Procedure: ESOPHAGOGASTRODUODENOSCOPY (EGD) WITH PROPOFOL;  Surgeon: [name], MD;  Location: [facility];  Service: Endoscopy;  Laterality: N/A;   EUS N/A 09/12/2020   Procedure: UPPER ENDOSCOPIC ULTRASOUND (EUS) RADIAL;  Surgeon: [name], MD;  Location: [facility];  Service: Endoscopy;  Laterality: N/A;   LAPAROSCOPIC  ASSISTED VAGINAL HYSTERECTOMY     LAPAROSCOPIC BILATERAL SALPINGO OOPHERECTOMY Bilateral 10/27/2019   Procedure: LAPAROSCOPIC BILATERAL SALPINGO OOPHORECTOMY WITH PERITONEAL WASHINGS;  Surgeon: Princess Bruins, MD;  Location: [place];  Service: Gynecology;  Laterality: Bilateral;  request 1:00pm on Friday, Dec. 4th in [place] time held requests one hour OR time   moles removed from upper back, face lft, 1 between breasts, upper leg left inside  10/18/2019   wearing small round bandaids on for 2 weeks   ROTATOR CUFF REPAIR Bilateral 08/2008    Prior to Admission medications   Medication Sig Start Date End Date Taking? Authorizing Provider  ALPRAZolam (XANAX) 0.5 MG tablet TAKE 1/2 TO 1 TABLET BY MOUTH ONCE DAILY AS NEEDED FOR SLEEP/ANXIETY 04/29/21   Tower, [name], MD  atenolol (TENORMIN) 25 MG tablet Take 12.5 mg by mouth 3 (three) times daily. For migraine prevention    [provider]  CALCIUM CARBONATE-VITAMIN D PO Take 1 tablet by mouth daily. 1000 mg    [provider]  famotidine (PEPCID) 40 MG tablet Take 40 mg by mouth 2 (two) times daily.     [provider]  hydrocortisone 2.5 % cream Apply to affected areas BID PRN as directed. 10/23/20   Moye, [place], MD  hydrOXYzine (ATARAX/VISTARIL) 25 MG tablet Take 1 tablet (25 mg total) by mouth 3 (three) times daily as needed. Patient taking differently: Take 31.25 mg by mouth 2 (two) times daily. 10/18/17   Abdurahman Rugg, Johnette Abraham B, FNP  iron polysaccharides (NIFEREX) 150 MG capsule Take 1 capsule (150 mg total) by mouth daily. 06/17/21   Tower, [name], MD  ketorolac (TORADOL) 10 MG tablet Take 1 tablet (10 mg total) by mouth every 6 (six) hours as needed for moderate pain. Patient taking differently: Take 2.5 mg by mouth 2 (two) times a week. 09/07/20   Carrie Mew, MD  letrozole [facility]) 2.5 MG tablet TAKE 1 TABLET BY MOUTH EVERY DAY 09/27/20   Nicholas Lose, MD  levocetirizine  (XYZAL) 5 MG tablet Take 5 mg by mouth at bedtime.  10/28/17   [provider]  Magnesium Oxide 400 (240 Mg) MG TABS Take 400 mg by mouth daily.     [provider]  metFORMIN (GLUCOPHAGE XR) 500 MG 24 hr tablet Take 1 tablet (500 mg total) by mouth daily with breakfast. 05/08/21   Tower, [name], MD  Multiple Vitamin (MULTIVITAMIN WITH MINERALS) TABS tablet Take 1 tablet by mouth daily.    [provider]  Omega-3 Fatty Acids (FISH OIL) 1000 MG CAPS Take 1,000 mg by mouth daily.     [provider]  OnabotulinumtoxinA (BOTOX IJ) Inject 1 Dose as directed every 6 (six) weeks.     [provider]  ondansetron (ZOFRAN ODT) 4 MG disintegrating tablet Take 1 tablet (4 mg total) by mouth every 8 (eight) hours as needed. 10/08/20   Rudene Re, MD  pantoprazole (PROTONIX) 40 MG tablet TAKE ONE TABLET BY MOUTH ONE TIME DAILY  05/22/21   Tower, [name], MD  PRESCRIPTION MEDICATION Inject into the skin every 6 (six) weeks. Steroid Trigger    [provider]  Probiotic Product (PROBIOTIC-10 PO) Take 1 capsule by mouth daily.     [provider]  promethazine (PHENERGAN) 25 MG tablet Take 12.5 mg by mouth daily as needed for nausea or vomiting. When having a migraine    [provider]  rizatriptan (MAXALT) 10 MG tablet Take 1 tablet (10 mg total) by mouth once as needed for up to 10 days for migraine. May repeat in 2 hours if needed 11/03/18 09/11/20  Menshew, Dannielle Karvonen, PA-C  SYNTHROID 75 MCG tablet TAKE ONE TABLET BY MOUTH DAILY BEFORE BREAKFAST 04/22/21   Tower, [name], MD  triamcinolone (KENALOG) 0.1 % Apply to affected areas BID PRN for up to two weeks at a time as directed. 10/23/20   Moye, [place], MD  TURMERIC PO Take 1 tablet by mouth daily.     [provider]    Allergies Ciprofloxacin, Codeine phosphate, Decongestant [pseudoephedrine], Flonase [fluticasone propionate], Hydrocodone, Macrobid [nitrofurantoin],  Reglan [metoclopramide], Topamax [topiramate], Benadryl [diphenhydramine hcl], and Sulfa antibiotics  Family History  Problem Relation Age of Onset   Breast cancer Mother 1   Diabetes Mother    Hypertension Father    Heart disease Father    Osteoporosis Maternal Grandmother    Diabetes Maternal Grandmother    Hypertension Sister    COPD Paternal Grandmother    Heart disease Paternal Grandfather    Breast cancer Cousin 75       pat first cousin   Breast cancer Cousin        mat second cousin with breast cancer in her 65s   Prostate cancer Other 42   Thyroid cancer Cousin 82       pat first cousin   Colon cancer Neg Hx    Stomach cancer Neg Hx    Esophageal cancer Neg Hx    Pancreatic cancer Neg Hx     Social History Social History   Tobacco Use   Smoking status: Never   Smokeless tobacco: Never  Vaping Use   Vaping Use: Never used  Substance Use Topics   Alcohol use: No    Alcohol/week: 0.0 standard drinks   Drug use: No    Review of Systems Constitutional: No fever/chills or recent injury. Eyes: No visual changes. ENT: No sore throat. Respiratory: Denies shortness of breath. Gastrointestinal: No abdominal pain.  No nausea, no vomiting.  No diarrhea.  No constipation. Musculoskeletal: Negative for pain. Skin: Negative for rash. Neurological:Positive for headache, negative for focal weakness or numbness. No confusion or fainting. ___________________________________________   PHYSICAL EXAM:  VITAL SIGNS: ED Triage Vitals  Enc Vitals Group     BP 06/22/21 1331 136/80     Pulse Rate 06/22/21 1331 67     Resp 06/22/21 1331 16     Temp 06/22/21 1331 99.2 F (37.3 C)     Temp Source 06/22/21 1331 Oral     SpO2 06/22/21 1331 100 %     Weight 06/22/21 1329 135 lb (61.2 kg)     Height 06/22/21 1329 5' (1.524 m)     Head Circumference --      Peak Flow --      Pain Score 06/22/21 1328 5     Pain Loc --      Pain Edu? --      Excl. in [place]? --  Constitutional: Alert and oriented. Well appearing and in no acute distress. Eyes: Conjunctivae are normal. PERRL. EOMI without expressed pain. No evidence of papilledema on limited exam. Head: Atraumatic. Nose: No congestion/rhinnorhea. Mouth/Throat: Mucous membranes are moist.  Oropharynx non-erythematous. Neck: No stridor. Supple, no meningismus.  Cardiovascular: Normal rate, regular rhythm. Grossly normal heart sounds.  Good peripheral circulation. Respiratory: Normal respiratory effort.  No retractions. Lungs CTAB. Gastrointestinal: Soft and nontender. No distention.  Musculoskeletal: No lower extremity tenderness nor edema.  No joint effusions. Neurologic:  Normal speech and language. No gross focal neurologic deficits are appreciated. No gait instability. Cranial nerves: 2-10 normal as tested. Cerebellar:Normal Romberg, finger-nose-finger, heel to shin, normal gait. Sensorimotor: No aphasia, pronator drift, clonus, sensory loss or abnormal reflexes.  Skin:  Skin is warm, dry and intact. No rash noted. Psychiatric: Mood and affect are normal. Speech and behavior are normal. Normal thought process and cognition.  ____________________________________________   LABS (all labs ordered are listed, but only abnormal results are displayed)  Labs Reviewed - No data to display ____________________________________________  EKG  Not indicated. ____________________________________________  RADIOLOGY  No results found. ____________________________________________   PROCEDURES  Procedure(s) performed:  Procedures  Critical Care performed: None ____________________________________________   INITIAL IMPRESSION / ASSESSMENT AND PLAN / ED COURSE  65 year old female presenting to the ER for treatment of migraine headache that was present upon awakening this morning. See HPI. Exam is reassuring. Will treat with fluids, toradol, and decadron. She  reports adverse effects of reglan  and benadryl, which is typically used for migraine cocktail but will be avoided today.  ----------------------------------------- 3:24 PM on 06/22/2021 ----------------------------------------- Nausea relieved with Zofran. Headache now 4/10. IV fluids infusing.   ----------------------------------------- 3:43 PM on 06/22/2021 -----------------------------------------  Pain continues to decrease. IV fluid bolus complete. Patient stable for discharge. She is to follow up with PCP or headache specialist. For symptoms that return, change, or worsen, she is to return to the ER.  Pertinent labs & imaging results that were available during my care of the patient were reviewed by me and considered in my medical decision making (see chart for details). ____________________________________________   FINAL CLINICAL IMPRESSION(S) / ED DIAGNOSES  Final diagnoses:  Intractable migraine without aura and without status migrainosus    ED Discharge Orders     None        Victorino Dike, FNP 06/22/21 1649    Vanessa , MD 06/23/21 [phone]

## 2021-06-26 ENCOUNTER — Telehealth: Payer: Self-pay | Admitting: Family Medicine

## 2021-06-26 DIAGNOSIS — D509 Iron deficiency anemia, unspecified: Secondary | ICD-10-CM

## 2021-06-26 NOTE — Telephone Encounter (Signed)
Left VM requesting pt to call the office back 

## 2021-06-26 NOTE — Telephone Encounter (Signed)
I pended ferric maltol 30 mg to take every other day  Send to pharm of choice Continue stool softerners   Let me know if not improved

## 2021-06-26 NOTE — Addendum Note (Signed)
Addended by: Loura Pardon A on: 06/26/2021 09:33 AM   Modules accepted: Orders

## 2021-06-26 NOTE — Telephone Encounter (Signed)
Left 2nd VM requesting pt to call the office back  

## 2021-06-26 NOTE — Telephone Encounter (Signed)
Jennifer [name] called in and stated that the iron medication she is on she can not tolerate it. And she is taking stool softeners 2x a day. And wanted to know why she is on that particular medication instead of the more commonly known one.

## 2021-06-27 NOTE — Telephone Encounter (Signed)
Left VM letting pt know Dr. Marliss Coots comments and that referral was done

## 2021-06-27 NOTE — Addendum Note (Signed)
Addended by: Loura Pardon A on: 06/27/2021 01:02 PM   Modules accepted: Orders

## 2021-06-27 NOTE — Telephone Encounter (Signed)
Pt notified of Dr. Marliss Coots comments. Pt had a few concerns about just "dropping the dose of her med" pt said she is already severely fatigued all day already. With all the GI med and other meds she's on she even questions on how well the iron is even getting absorbed in her sxs. She said not only is the iron causing severe constipation it's causing her to have an upset stomach too. Pt doesn't see how dropping the dose will help her iron #'s and help with the fatigue that we are trying to improved. Pt said she can't take another iron pill it is just causing to much GI/ constipation issues and asked if there is another option for her like an infusion or shot. I did advise pt that they do iron infusions thorough hematology but that is something PCP has to review and see if that is an option for pt. Pt said she really wants to try the iron infusion if PCP thinks it will help her fatigue and improve her GI/constipation issues. Pt said if PCP approves it she would like to go somewhere in [place] like [facility] but she is aware PCP has review and see if iron infusions are an option for her, please advise   Med not sent in until PCP reviews message

## 2021-06-27 NOTE — Addendum Note (Signed)
Addended by: Tammi Sou on: 06/27/2021 12:42 PM   Modules accepted: Orders

## 2021-06-27 NOTE — Telephone Encounter (Signed)
It's a different form of iron -but I understand her concern  I will place a hematology referral so she can have a visit to see if infusion would be best (it is generally a good option)   I will preference [facility]   Unsure how long it will take

## 2021-07-02 DIAGNOSIS — G43719 Chronic migraine without aura, intractable, without status migrainosus: Secondary | ICD-10-CM | POA: Diagnosis not present

## 2021-07-02 DIAGNOSIS — G43019 Migraine without aura, intractable, without status migrainosus: Secondary | ICD-10-CM | POA: Diagnosis not present

## 2021-07-07 ENCOUNTER — Other Ambulatory Visit: Payer: Self-pay

## 2021-07-07 ENCOUNTER — Telehealth: Payer: Self-pay | Admitting: *Deleted

## 2021-07-07 ENCOUNTER — Telehealth: Payer: Self-pay | Admitting: Family Medicine

## 2021-07-07 ENCOUNTER — Inpatient Hospital Stay: Payer: BC Managed Care – PPO

## 2021-07-07 ENCOUNTER — Encounter: Payer: Self-pay | Admitting: Oncology

## 2021-07-07 ENCOUNTER — Inpatient Hospital Stay: Payer: BC Managed Care – PPO | Attending: Oncology | Admitting: Oncology

## 2021-07-07 VITALS — BP 134/55 | HR 66 | Temp 97.9°F | Resp 18 | Wt 139.0 lb

## 2021-07-07 DIAGNOSIS — K5909 Other constipation: Secondary | ICD-10-CM | POA: Diagnosis not present

## 2021-07-07 DIAGNOSIS — Z90710 Acquired absence of both cervix and uterus: Secondary | ICD-10-CM | POA: Diagnosis not present

## 2021-07-07 DIAGNOSIS — D509 Iron deficiency anemia, unspecified: Secondary | ICD-10-CM | POA: Insufficient documentation

## 2021-07-07 DIAGNOSIS — K5903 Drug induced constipation: Secondary | ICD-10-CM | POA: Diagnosis not present

## 2021-07-07 DIAGNOSIS — Z853 Personal history of malignant neoplasm of breast: Secondary | ICD-10-CM | POA: Diagnosis not present

## 2021-07-07 NOTE — Telephone Encounter (Signed)
Pt was seen today by Dr. Tasia Catchings and wanted to know why she now has two oncologist. She would like to stay with Dr.Gudena

## 2021-07-07 NOTE — Telephone Encounter (Signed)
Jennifer [name] states she was referred to [facility] for Anemia. When she arrived they stated that she will now have 2 oncologist. Would like to come to Dr Jennifer [name] for anemia treatment as well. Message to Dr Jennifer [name].

## 2021-07-07 NOTE — Telephone Encounter (Signed)
I thought she wanted to be seen in [name] for iron infusions- sorry about that, my mistake. Was aiming for convenience.

## 2021-07-07 NOTE — Progress Notes (Signed)
Patient here for hematology  appointment, She states she cant tolerate iron tablets. Very fatigue and sleepy. States she cant absorb Iron due to other medications. Has chronic margines.

## 2021-07-07 NOTE — Telephone Encounter (Signed)
Pt already had an oncologist for her breast cancer, we referred pt to hematology due to anemia to see if iron infusions were an options and they set pt up with Dr. Tasia Catchings. If Dr. Lindi Adie handles both that would be oncologist call but will route note to oncologist to advise further regarding this issue

## 2021-07-07 NOTE — Progress Notes (Signed)
Hematology/Oncology Consult note [facility] Telephone:([phone] [phone]   Patient Care Team: Tower, [name], MD as PCP - General Nicholas Lose, MD as Consulting Physician (Hematology and Oncology) Kyung Rudd, MD as Consulting Physician (Radiation Oncology) Jovita Kussmaul, MD as Consulting Physician (General Surgery)  REFERRING PROVIDER: Tower, [name], MD  CHIEF COMPLAINTS/REASON FOR VISIT:  Evaluation of iron deficiency anemia  HISTORY OF PRESENTING ILLNESS:   Jennifer [name] is a  65 y.o.  female with PMH listed below was seen in consultation at the request of  Tower, [name], MD  for evaluation of iron deficiency anemia  Reviewed patient's recent labs done at primary care provider's office.   06/13/2021, hemoglobin 10.9, MCV 75.4, ferritin 3.6, iron 25. Reviewed previous blood work.  Microcytic anemia is chronic since December 2020. Gradually getting worse.  Patient reports previously she has taken oral iron supplementation for about 5 days.  Iron makes her very constipated and fatigued.  She is currently off oral iron supplementation. Patient has migraine, she cannot take Tylenol due to believe of Tylenol may trigger her migraine. She noticed black stool when she took oral iron supplementation.  Her bowel movement has not been regular lately.  She went to emergency room for migraine treatments and received Zofran, Decadron.  She feels Zofran may have contributed to her constipation.  Patient reports that she is feeling very anxious today.  As she is sitting in the cancer center.  She tells me that she has a history of breast cancer for which she follows up with Dr. Lindi Adie in [place].  She sees him once a year. She is not happy with not being told prior to this visit that Dr. Lindi Adie sees hematology problems.   Patient has multiple allergies/adverse reactions to medications.  Dyspnea.  She also reports a history of anaphylactic reaction when she  was a child to certain food.  She cannot take Benadryl and Reglan due to adverse reactions.  She would not like to receive Benadryl under any circumstances.  Patient follows up with [place] gastro[facility]ology.  Patient feels fatigued.  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  Last colonoscopy was in 2019.  She reports she had an ERCP with gastroenterology last year.  Records were not available in epic's.  Review of Systems  Constitutional:  Positive for fatigue. Negative for appetite change, chills and fever.  HENT:   Negative for hearing loss and voice change.   Eyes:  Negative for eye problems.  Respiratory:  Negative for chest tightness and cough.   Cardiovascular:  Negative for chest pain.  Gastrointestinal:  Positive for constipation. Negative for abdominal distention, abdominal pain and blood in stool.  Endocrine: Negative for hot flashes.  Genitourinary:  Negative for difficulty urinating and frequency.   Musculoskeletal:  Negative for arthralgias.  Skin:  Negative for itching and rash.  Neurological:  Negative for extremity weakness.       Chronic migraine  Hematological:  Negative for adenopathy.  Psychiatric/Behavioral:  Negative for confusion.    MEDICAL HISTORY:  Past Medical History:  Diagnosis Date   Allergic rhinitis    Anxiety    Arthritis    hands, knees   Breast cancer ([place]) 2019   Right Breast Cancer   Cancer ([place]) 09/2018   Breast CA new DX, right breast   Cervical dysplasia    Common bile duct dilation    Endometriosis  Esophageal reflux    Family history of adverse reaction to anesthesia    younger sister anxiety for a dew days after   Family history of breast cancer    Family history of breast cancer    Family history of prostate cancer    Hepatic cyst 08/2020   multiple   Hepatic hemangioma 08/2020   intrahepatic hemangioma   Hiatal hernia    Hypothyroid    Migraine  with aura, without mention of intractable migraine without mention of status migrainosus    Osteopenia 08/2019   T score -2.2 FRAX 11% / 1.7%   Personal history of radiation therapy 2019   Right Breast Cancer   Pre-diabetes     SURGICAL HISTORY: Past Surgical History:  Procedure Laterality Date   BIOPSY  09/12/2020   Procedure: BIOPSY;  Surgeon: [name], MD;  Location: [facility];  Service: Endoscopy;;   BREAST EXCISIONAL BIOPSY Bilateral over 10 years ago   benign x 2    BREAST LUMPECTOMY Right 10/10/2018   BREAST LUMPECTOMY WITH RADIOACTIVE SEED AND SENTINEL LYMPH NODE BIOPSY Right 10/10/2018   Procedure: RIGHT BREAST LUMPECTOMY WITH RADIOACTIVE SEED AND RIGHT SENTINEL LYMPH NODE BIOPSY;  Surgeon: [name], MD;  Location: [place];  Service: General;  Laterality: Right;   BREAST SURGERY     Benign breast lump excised   CERVICAL CONE BIOPSY  1985   severe dysplasia   COLONOSCOPY  2005 and dec 2019   normal   COLPOSCOPY     endoscopy  10/2018   ESOPHAGOGASTRODUODENOSCOPY (EGD) WITH PROPOFOL N/A 09/12/2020   Procedure: ESOPHAGOGASTRODUODENOSCOPY (EGD) WITH PROPOFOL;  Surgeon: [name], MD;  Location: [facility];  Service: Endoscopy;  Laterality: N/A;   EUS N/A 09/12/2020   Procedure: UPPER ENDOSCOPIC ULTRASOUND (EUS) RADIAL;  Surgeon: [name], MD;  Location: [facility];  Service: Endoscopy;  Laterality: N/A;   LAPAROSCOPIC ASSISTED VAGINAL HYSTERECTOMY     LAPAROSCOPIC BILATERAL SALPINGO OOPHERECTOMY Bilateral 10/27/2019   Procedure: LAPAROSCOPIC BILATERAL SALPINGO OOPHORECTOMY WITH PERITONEAL WASHINGS;  Surgeon: Princess Bruins, MD;  Location: [place];  Service: Gynecology;  Laterality: Bilateral;  request 1:00pm on Friday, Dec. 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   Social Determinants of Health   Financial Resource Strain: Not on file  Food Insecurity: Not on file  Transportation Needs: Not on file  Physical Activity: Not on file  Stress: Not on file  Social Connections: Not on file  Intimate Partner Violence: Not on file    FAMILY HISTORY: Family History  Problem Relation Age of Onset   Breast cancer Mother 30   Diabetes Mother    Hypertension Father    Heart disease Father    Osteoporosis Maternal Grandmother    Diabetes Maternal Grandmother    Hypertension Sister    COPD Paternal Grandmother    Heart disease Paternal Grandfather    Breast cancer Cousin 52       pat first cousin   Breast cancer Cousin        mat second cousin with breast cancer in her 56s   Prostate cancer Other 81   Thyroid cancer Cousin 50       pat first cousin   Colon cancer Neg Hx    Stomach cancer Neg Hx    Esophageal cancer Neg Hx    Pancreatic cancer Neg Hx     ALLERGIES:  is allergic to ciprofloxacin, codeine phosphate, decongestant [pseudoephedrine], flonase [fluticasone propionate], hydrocodone, macrobid [nitrofurantoin],  reglan [metoclopramide], topamax [topiramate], benadryl [diphenhydramine hcl], and sulfa antibiotics.  MEDICATIONS:  Current Outpatient Medications  Medication Sig Dispense Refill   ALPRAZolam (XANAX) 0.5 MG tablet TAKE 1/2 TO 1 TABLET BY MOUTH ONCE DAILY AS NEEDED FOR SLEEP/ANXIETY 15 tablet 1   atenolol (TENORMIN) 25 MG tablet Take 12.5 mg by mouth 3 (three) times daily. For migraine prevention     bisacodyl (DULCOLAX) 5 MG EC tablet Take 5 mg by mouth daily as needed for moderate constipation.     CALCIUM CARBONATE-VITAMIN D PO Take 1 tablet by mouth daily. 1000 mg     famotidine (PEPCID) 40 MG tablet Take 40 mg by mouth 2 (two) times daily.      hydrocortisone 2.5 % cream Apply to affected areas BID PRN as directed. 30 g 3   iron polysaccharides (NIFEREX) 150 MG capsule Take 1 capsule (150 mg total) by mouth daily. 30 capsule 3   letrozole (FEMARA) 2.5 MG tablet TAKE 1 TABLET BY MOUTH EVERY DAY 90 tablet 3   levocetirizine (XYZAL) 5 MG tablet Take 5 mg by mouth at bedtime.   3   Magnesium Oxide 400 (240 Mg) MG TABS Take 400 mg by mouth daily.      metFORMIN (GLUCOPHAGE XR) 500 MG 24 hr tablet Take 1 tablet (500 mg total) by mouth daily with breakfast. 90 tablet 3   Multiple Vitamin (MULTIVITAMIN WITH MINERALS) TABS tablet Take 1 tablet by mouth daily.     Omega-3 Fatty Acids (FISH OIL) 1000 MG CAPS Take 1,000 mg by mouth daily.      OnabotulinumtoxinA (BOTOX IJ) Inject 1 Dose as directed every 6 (six) weeks.      ondansetron (ZOFRAN ODT) 4 MG disintegrating tablet Take 1 tablet (4 mg total) by mouth every 8 (eight) hours as needed. 20 tablet 0   pantoprazole (PROTONIX) 40 MG tablet TAKE ONE TABLET BY MOUTH ONE TIME DAILY 90 tablet 0   PRESCRIPTION MEDICATION Inject into the skin every 6 (six) weeks. Steroid Trigger     Probiotic Product (PROBIOTIC-10 PO) Take 1 capsule by mouth daily.      promethazine (PHENERGAN) 25 MG tablet Take 12.5 mg by mouth daily as needed for nausea or vomiting.  When having a migraine     rizatriptan (MAXALT) 10 MG tablet Take 1 tablet (10 mg total) by mouth once as needed for  up to 10 days for migraine. May repeat in 2 hours if needed 10 tablet 0   SYNTHROID 75 MCG tablet TAKE ONE TABLET BY MOUTH DAILY BEFORE BREAKFAST 30 tablet 2   triamcinolone (KENALOG) 0.1 % Apply to affected areas BID PRN for up to two weeks at a time as directed. 45 g 3   TURMERIC PO Take 1 tablet by mouth daily.      hydrOXYzine (ATARAX/VISTARIL) 25 MG tablet Take 1 tablet (25 mg total) by mouth 3 (three) times daily as needed. (Patient not taking: Reported on 07/07/2021) 30 tablet 0   ketorolac (TORADOL) 10 MG tablet Take 1 tablet (10 mg total) by mouth every 6 (six) hours as needed for moderate pain. (Patient not taking: Reported on 07/07/2021) 12 tablet 0   No current facility-administered medications for this visit.     PHYSICAL EXAMINATION: ECOG PERFORMANCE STATUS: 1 - Symptomatic but completely ambulatory Vitals:   07/07/21 1133  BP: (!) 134/55  Pulse: 66  Resp: 18  Temp: 97.9 F (36.6 C)  SpO2: 100%   Filed Weights   07/07/21 1133  Weight: 139 lb (63 kg)    Physical Exam Eyes:     Extraocular Movements: Extraocular movements intact.     Pupils: Pupils are equal, round, and reactive to light.  Cardiovascular:     Heart sounds: Normal heart sounds. No murmur heard. Pulmonary:     Effort: Pulmonary effort is normal.     Breath sounds: Normal breath sounds. No wheezing.  Skin:    General: Skin is warm.  Neurological:     General: No focal deficit present.     Mental Status: She is alert and oriented to person, place, and time.  Psychiatric:     Comments: Anxious   Declined abdomen examination  LABORATORY DATA:  I have reviewed the data as listed Lab Results  Component Value Date   WBC 5.6 06/13/2021   HGB 10.9 (L) 06/13/2021   HCT 34.1 (L) 06/13/2021   MCV 75.4 (L) 06/13/2021   PLT 352.0 06/13/2021   Recent Labs    08/27/20 2101  09/05/20 1511 10/08/20 0226 10/28/20 1205 06/13/21 0900  NA 134*  --  132*  --  137  K 3.5  --  4.2  --  4.7  CL 98  --  94*  --  101  CO2 26  --  28  --  29  GLUCOSE 94  --  158*  --  98  BUN 16  --  13  --  14  CREATININE 0.69  --  0.70  --  0.74  CALCIUM 9.2  --  9.3  --  9.4  GFRNONAA >60  --  >60  --   --   PROT 7.3 7.7 7.9 7.5 6.7  ALBUMIN 4.2 4.5 4.4 4.4 4.1  AST '22 18 20 16 15  '$ ALT '21 19 19 16 18  '$ ALKPHOS 116 133* 104 123* 116  BILITOT 0.8 0.4 0.6 0.4 0.4  BILIDIR  --  0.1  --  0.1  --    Iron/TIBC/Ferritin/ %Sat    Component Value Date/Time   IRON 25 (L) 06/13/2021 0900   FERRITIN 3.6 (L) 06/13/2021 0900      RADIOGRAPHIC STUDIES: I have personally reviewed the radiological images as listed and agreed with the findings in the report. DG Wrist Complete Right  Result Date: 04/18/2021 CLINICAL DATA:  Right wrist pain near thumb, no known injury, initial encounter EXAM: RIGHT WRIST -  COMPLETE 3+ VIEW COMPARISON:  11/03/2019 FINDINGS: Mild degenerative changes of the first [facility] joint are noted. No acute fracture or dislocation is seen. No soft tissue abnormality is noted. IMPRESSION: Mild degenerative change without acute abnormality. Electronically Signed   By: Inez Catalina M.D.   On: 04/18/2021 15:39       ASSESSMENT & PLAN:  1. Iron deficiency anemia, unspecified iron deficiency anemia type   2. History of breast cancer   3. Drug-induced constipation    Previous labs reviewed and discussed with patient.Patient has a hemoglobin of 10.9, with an MCV of 75.4, ferritin of 3.5 this is consistent with iron deficiency.  Patient was upset about the fact that she was not told that her [facility] oncologist Dr.Gudena also sees hematology issues and she may be able to have iron deficiency treated there.  She feels that if she was notified when " Cancer center called me to make appointment", she would have make an appointment to see Dr.Gudena instead of being seen at [facility]  cancer center.  I apologized to her that when her appointment being scheduled, our scheduler is not aware about the fact that she is an established patient with [facility] hematology oncology group.  We discussed about option of her seeking her care with Dr. Lindi Adie or I can help to take care of her hematology issues locally at [facility].  Etiology of the iron deficiency is unknown.  Patient has history of hysterectomy.  I recommend patient to continue follow-up with gastroenterology for work-up of iron deficiency etiology.  In terms of the treatment Iron deficiency anemia,we discussed about IV venofer treatments since patient does not tolerate oral iron supplementation due to GI side effects. Allergy reactions/infusion reactions including rare anaphylactic reaction were discussed with patient. Other side effects include but not limited to high blood pressure, headache, skin rash, weight gain, leg swelling, flulike symptoms etc.  Patient is anxious about the potential side effects.  She has list of allergy/adverse reactions to medications and also reports to have history of anaphylactic reaction to food [cantaloupe] during her childhood.  She reports to have adverse reaction to Benadryl.  I clarified with her about a few questions about her reaction to Benadryl.  I discussed about possible measures in case she develops anaphylactic reaction to IV Venofer treatments.  Patient understands that anaphylactic reactions are rare and I am discussing  to her to make sure that she is fully consented.   Patient does not want Benadryl to be used under any circumstances, and feels that my questions regarding her Benadryl is redundant.  At that point, I feel that our discussion has exacerbated her anxiety and she decides not to continue our conversation further.  She feels " this is not the right place for me", " our conversation started with wrong footage".   I updated Dr. Lindi Adie and patient's primary care provider  Dr.Tower.   Orders Placed This Encounter  Procedures   Iron and TIBC    Standing Status:   Future    Standing Expiration Date:   07/07/2022   Ferritin    Standing Status:   Future    Standing Expiration Date:   01/07/2022    All questions were answered. The patient knows to call the clinic with any problems questions or concerns.  cc Tower, [name], MD   Patient would not like to proceed with her care with me. Thank you for this kind referral and the opportunity to participate in the care of this  patient. A copy of today's note is routed to referring provider    Earlie Server, MD, PhD Hematology Oncology [facility] at [facility] Pager- [phone] 07/07/2021

## 2021-07-08 ENCOUNTER — Telehealth: Payer: Self-pay | Admitting: Oncology

## 2021-07-08 ENCOUNTER — Telehealth: Payer: Self-pay | Admitting: Hematology and Oncology

## 2021-07-08 NOTE — Telephone Encounter (Signed)
Called pt to confirm if she wanted to see another provider at [facility] cancer center. She stated she has no desire to be seen at [facility] and will continue care with Dr. Lindi Adie.

## 2021-07-08 NOTE — Telephone Encounter (Signed)
Scheduled appt per 8/16 sch msg. Pt aware.

## 2021-07-13 NOTE — Progress Notes (Signed)
Patient Care Team: Tower, [name], MD as PCP - General Nicholas Lose, MD as Consulting Physician (Hematology and Oncology) Kyung Rudd, MD as Consulting Physician (Radiation Oncology) Jovita Kussmaul, MD as Consulting Physician (General Surgery)  DIAGNOSIS:    ICD-10-CM   1. Malignant neoplasm of upper-inner quadrant of right breast in female, estrogen receptor positive ([place])  C50.211    Z17.0       SUMMARY OF ONCOLOGIC HISTORY: Oncology History  Malignant neoplasm of upper-inner quadrant of right breast in female, estrogen receptor positive ([place])  09/13/2018 Initial Diagnosis   Diagnostic mammogram detected right breast mass with distortion LIQ at 2:00 9 cm from nipple 1.7 cm, at 1:00 there was a benign 1.2 cm fibrocystic change, biopsy of the 2:00 mass revealed grade 1 IDC with DCIS ER 100%, PR 90%, Ki-67 10%, HER-2 1+ negative, T1CN0 stage Ia clinical stage   10/10/2018 Surgery   Right lumpectomy: IDC grade 1, 1.2 cm, with intermediate grade DCIS, margins negative, 0/2 lymph nodes negative, T1CN0 stage Ia ER 100%, PR 90%, Ki-67 10%, HER-2 1+ negative    10/10/2018 Oncotype testing   oncotype results of 29: Risk of recurrence without chemo 18%   11/07/2018 - 12/08/2018 Radiation Therapy   1. Right Breast / 42.56 Gy in 16 fractions 2. Right Breast Boost / 8 Gy in 4 fractions - Total dose 50.56 Gy   12/2018 -  Anti-estrogen oral therapy   Anastrozole daily, planned for 7 years   11/11/2019 Genetic Testing   Negative genetic testing on the common hereditary cancer panel.  The Common Hereditary Gene Panel offered by [facility] includes sequencing and/or deletion duplication testing of the following 48 genes: APC, ATM, AXIN2, BARD1, BMPR1A, BRCA1, BRCA2, BRIP1, CDH1, CDK4, CDKN2A (p14ARF), CDKN2A (p16INK4a), CHEK2, CTNNA1, DICER1, EPCAM (Deletion/duplication testing only), GREM1 (promoter region deletion/duplication testing only), KIT, MEN1, MLH1, MSH2, MSH3, MSH6, MUTYH, NBN, NF1,  NHTL1, PALB2, PDGFRA, PMS2, POLD1, POLE, PTEN, RAD50, RAD51C, RAD51D, RNF43, SDHB, SDHC, SDHD, SMAD4, SMARCA4. STK11, TP53, TSC1, TSC2, and VHL.  The following genes were evaluated for sequence changes only: SDHA and HOXB13 c.251G>A variant only. The report date is 11/11/2019     CHIEF COMPLIANT: Follow-up of right breast cancer on letrozole, concern for lymphedema  INTERVAL HISTORY: Jennifer [name] is a 65 y.o. with above-mentioned history of right breast cancer who underwent lumpectomy, radiation, and is currently on anastrozole. She presents to the clinic today for follow-up.   ALLERGIES:  is allergic to ciprofloxacin, codeine phosphate, decongestant [pseudoephedrine], flonase [fluticasone propionate], hydrocodone, macrobid [nitrofurantoin], reglan [metoclopramide], topamax [topiramate], benadryl [diphenhydramine hcl], and sulfa antibiotics.  MEDICATIONS:  Current Outpatient Medications  Medication Sig Dispense Refill   ALPRAZolam (XANAX) 0.5 MG tablet TAKE 1/2 TO 1 TABLET BY MOUTH ONCE DAILY AS NEEDED FOR SLEEP/ANXIETY 15 tablet 1   atenolol (TENORMIN) 25 MG tablet Take 12.5 mg by mouth 3 (three) times daily. For migraine prevention     bisacodyl (DULCOLAX) 5 MG EC tablet Take 5 mg by mouth daily as needed for moderate constipation.     CALCIUM CARBONATE-VITAMIN D PO Take 1 tablet by mouth daily. 1000 mg     famotidine (PEPCID) 40 MG tablet Take 40 mg by mouth 2 (two) times daily.      hydrocortisone 2.5 % cream Apply to affected areas BID PRN as directed. 30 g 3   hydrOXYzine (ATARAX/VISTARIL) 25 MG tablet Take 1 tablet (25 mg total) by mouth 3 (three) times daily as needed. (Patient not taking: Reported  on 07/07/2021) 30 tablet 0   iron polysaccharides (NIFEREX) 150 MG capsule Take 1 capsule (150 mg total) by mouth daily. 30 capsule 3   ketorolac (TORADOL) 10 MG tablet Take 1 tablet (10 mg total) by mouth every 6 (six) hours as needed for moderate pain. (Patient not taking: Reported on  07/07/2021) 12 tablet 0   letrozole (FEMARA) 2.5 MG tablet TAKE 1 TABLET BY MOUTH EVERY DAY 90 tablet 3   levocetirizine (XYZAL) 5 MG tablet Take 5 mg by mouth at bedtime.   3   Magnesium Oxide 400 (240 Mg) MG TABS Take 400 mg by mouth daily.      metFORMIN (GLUCOPHAGE XR) 500 MG 24 hr tablet Take 1 tablet (500 mg total) by mouth daily with breakfast. 90 tablet 3   Multiple Vitamin (MULTIVITAMIN WITH MINERALS) TABS tablet Take 1 tablet by mouth daily.     Omega-3 Fatty Acids (FISH OIL) 1000 MG CAPS Take 1,000 mg by mouth daily.      OnabotulinumtoxinA (BOTOX IJ) Inject 1 Dose as directed every 6 (six) weeks.      ondansetron (ZOFRAN ODT) 4 MG disintegrating tablet Take 1 tablet (4 mg total) by mouth every 8 (eight) hours as needed. 20 tablet 0   pantoprazole (PROTONIX) 40 MG tablet TAKE ONE TABLET BY MOUTH ONE TIME DAILY 90 tablet 0   PRESCRIPTION MEDICATION Inject into the skin every 6 (six) weeks. Steroid Trigger     Probiotic Product (PROBIOTIC-10 PO) Take 1 capsule by mouth daily.      promethazine (PHENERGAN) 25 MG tablet Take 12.5 mg by mouth daily as needed for nausea or vomiting. When having a migraine     rizatriptan (MAXALT) 10 MG tablet Take 1 tablet (10 mg total) by mouth once as needed for up to 10 days for migraine. May repeat in 2 hours if needed 10 tablet 0   SYNTHROID 75 MCG tablet TAKE ONE TABLET BY MOUTH DAILY BEFORE BREAKFAST 30 tablet 2   triamcinolone (KENALOG) 0.1 % Apply to affected areas BID PRN for up to two weeks at a time as directed. 45 g 3   TURMERIC PO Take 1 tablet by mouth daily.      No current facility-administered medications for this visit.    PHYSICAL EXAMINATION: ECOG PERFORMANCE STATUS: 1 - Symptomatic but completely ambulatory  Vitals:   07/14/21 1001  BP: 130/76  Pulse: 73  Resp: 18  Temp: 97.8 F (36.6 C)  SpO2: 100%   Filed Weights   07/14/21 1001  Weight: 140 lb 6.4 oz (63.7 kg)    BREAST: No palpable masses or nodules in either right  or left breasts. No palpable axillary supraclavicular or infraclavicular adenopathy no breast tenderness or nipple discharge. (exam performed in the presence of a chaperone)  LABORATORY DATA:  I have reviewed the data as listed CMP Latest Ref Rng & Units 06/13/2021 10/28/2020 10/08/2020  Glucose 70 - 99 mg/dL 98 - 158(H)  BUN 6 - 23 mg/dL 14 - 13  Creatinine 0.40 - 1.20 mg/dL 0.74 - 0.70  Sodium 135 - 145 mEq/L 137 - 132(L)  Potassium 3.5 - 5.1 mEq/L 4.7 - 4.2  Chloride 96 - 112 mEq/L 101 - 94(L)  CO2 19 - 32 mEq/L 29 - 28  Calcium 8.4 - 10.5 mg/dL 9.4 - 9.3  Total Protein 6.0 - 8.3 g/dL 6.7 7.5 7.9  Total Bilirubin 0.2 - 1.2 mg/dL 0.4 0.4 0.6  Alkaline Phos 39 - 117 U/L 116 123(H) 104  AST 0 - 37 U/L 15 16 20   ALT 0 - 35 U/L 18 16 19     Lab Results  Component Value Date   WBC 5.6 06/13/2021   HGB 10.9 (L) 06/13/2021   HCT 34.1 (L) 06/13/2021   MCV 75.4 (L) 06/13/2021   PLT 352.0 06/13/2021   NEUTROABS 2.9 06/13/2021    ASSESSMENT & PLAN:  Malignant neoplasm of upper-inner quadrant of right breast in female, estrogen receptor positive ([place]) 10/10/2018: Right lumpectomy: IDC grade 1, 1.2 cm, with intermediate grade DCIS, margins negative, 0/2 lymph nodes negative, T1CN0 stage Ia ER 100%, PR 90%, Ki-67 10%, HER-2 1+ negative Oncotype DX: 29: 18% risk of recurrence   Treatment plan: 1.   I recommended systemic adjuvant chemotherapy with Taxotere and Cytoxan every 3 weeks x4 cycles (patient refused) 2. Adjuvant radiation therapy 11/08/2018-12/05/2018 3. Adjuvant antiestrogen therapy with anastrozole started 12/05/2018 switched to letrozole 10/09/19   Letrozole toxicities:myalgias   Extensive work-up for abdominal pain including CT abdomen pelvis and chest as well as MRCP: All these tests were normal.  Follows with gastroenterology.   Iron deficiency anemia: Ferritin 3.6 Recent onset. We discussed extensively about blood loss versus malabsorption.  Patient has an appoint with  gastroenterology. I would like to give her 3 doses of IV Venofer.  She has an allergy to Benadryl so therefore we will not give Benadryl to her. Return to clinic in November to recheck of her labs and follow-up to discuss results.    No orders of the defined types were placed in this encounter.  The patient has a good understanding of the overall plan. she agrees with it. she will call with any problems that may develop before the next visit here.  Total time spent: 20 mins including face to face time and time spent for planning, charting and coordination of care  Rulon Eisenmenger, MD, MPH 07/14/2021  I, Thana Ates, am acting as scribe for Dr. Nicholas Lose.  I have reviewed the above documentation for accuracy and completeness, and I agree with the above.

## 2021-07-14 ENCOUNTER — Other Ambulatory Visit: Payer: Self-pay

## 2021-07-14 ENCOUNTER — Inpatient Hospital Stay: Payer: BC Managed Care – PPO | Attending: Hematology and Oncology | Admitting: Hematology and Oncology

## 2021-07-14 DIAGNOSIS — Z79811 Long term (current) use of aromatase inhibitors: Secondary | ICD-10-CM | POA: Diagnosis not present

## 2021-07-14 DIAGNOSIS — Z170 Estrogen receptor positive status [ER+]: Secondary | ICD-10-CM

## 2021-07-14 DIAGNOSIS — C50211 Malignant neoplasm of upper-inner quadrant of right female breast: Secondary | ICD-10-CM | POA: Diagnosis not present

## 2021-07-14 DIAGNOSIS — D509 Iron deficiency anemia, unspecified: Secondary | ICD-10-CM | POA: Diagnosis not present

## 2021-07-14 NOTE — Assessment & Plan Note (Signed)
10/10/2018:Right lumpectomy: IDC grade 1, 1.2 cm, with intermediate grade DCIS, margins negative, 0/2 lymph nodes negative, T1CN0 stage Ia ER 100%, PR 90%, Ki-67 10%, HER-2 1+ negative Oncotype DX: 29: 18% risk of recurrence  Treatment plan: 1.I recommended systemic adjuvant chemotherapy withTaxotere and Cytoxan every 3 weeks x4 cycles(patient refused) 2. Adjuvant radiation therapy12/17/2019-12/05/2018 3. Adjuvant antiestrogen therapywithanastrozole started 1/13/2020switched to letrozole 10/09/19  Letrozoletoxicities:myalgias   Extensive work-up for abdominal pain including CT abdomen pelvis and chest as well as MRCP: All these tests were normal. Follows with gastroenterology.  Concern for right arm lymphedema: No clinical findings of lymphedema.  I reassured her that most of the symptoms that she is experiencing are related to muscle and ligament inflammation and that the NSAIDs for a week would be adequate.  She will cancel her physical therapy appointment for lymphedema.  Breast cancer surveillance: 1.Mammogram10/20/2021: Benign breast density category D 2.Breast exam 07/14/2021: Benign 3.CT abdomen 09/18/2019: Acute cystitis, 4.5 cm right adnexal cyst 4.Bone density 08/24/2019: T score -2.2: Osteopenia  RTC in  1 year for follow-up

## 2021-07-21 ENCOUNTER — Other Ambulatory Visit: Payer: Self-pay

## 2021-07-21 ENCOUNTER — Telehealth: Payer: Self-pay | Admitting: Hematology and Oncology

## 2021-07-21 DIAGNOSIS — D509 Iron deficiency anemia, unspecified: Secondary | ICD-10-CM

## 2021-07-21 NOTE — Telephone Encounter (Signed)
R/s iron appt on 9/7 per RN Melanie. Called pt, no answer. Left msg with updated appt date and time.

## 2021-07-22 ENCOUNTER — Other Ambulatory Visit: Payer: Self-pay

## 2021-07-22 ENCOUNTER — Inpatient Hospital Stay: Payer: BC Managed Care – PPO

## 2021-07-22 VITALS — BP 130/69 | HR 64 | Temp 97.9°F | Resp 18

## 2021-07-22 DIAGNOSIS — C50211 Malignant neoplasm of upper-inner quadrant of right female breast: Secondary | ICD-10-CM | POA: Diagnosis not present

## 2021-07-22 DIAGNOSIS — Z170 Estrogen receptor positive status [ER+]: Secondary | ICD-10-CM | POA: Diagnosis not present

## 2021-07-22 DIAGNOSIS — D509 Iron deficiency anemia, unspecified: Secondary | ICD-10-CM | POA: Diagnosis not present

## 2021-07-22 DIAGNOSIS — Z79811 Long term (current) use of aromatase inhibitors: Secondary | ICD-10-CM | POA: Diagnosis not present

## 2021-07-22 LAB — CBC WITH DIFFERENTIAL (CANCER CENTER ONLY)
Abs Immature Granulocytes: 0.01 10*3/uL (ref 0.00–0.07)
Basophils Absolute: 0.1 10*3/uL (ref 0.0–0.1)
Basophils Relative: 1 %
Eosinophils Absolute: 0.2 10*3/uL (ref 0.0–0.5)
Eosinophils Relative: 3 %
HCT: 33.2 % — ABNORMAL LOW (ref 36.0–46.0)
Hemoglobin: 10.7 g/dL — ABNORMAL LOW (ref 12.0–15.0)
Immature Granulocytes: 0 %
Lymphocytes Relative: 34 %
Lymphs Abs: 2.2 10*3/uL (ref 0.7–4.0)
MCH: 24.7 pg — ABNORMAL LOW (ref 26.0–34.0)
MCHC: 32.2 g/dL (ref 30.0–36.0)
MCV: 76.7 fL — ABNORMAL LOW (ref 80.0–100.0)
Monocytes Absolute: 0.7 10*3/uL (ref 0.1–1.0)
Monocytes Relative: 11 %
Neutro Abs: 3.4 10*3/uL (ref 1.7–7.7)
Neutrophils Relative %: 51 %
Platelet Count: 340 10*3/uL (ref 150–400)
RBC: 4.33 MIL/uL (ref 3.87–5.11)
RDW: 16 % — ABNORMAL HIGH (ref 11.5–15.5)
WBC Count: 6.6 10*3/uL (ref 4.0–10.5)
nRBC: 0 % (ref 0.0–0.2)

## 2021-07-22 LAB — IRON AND TIBC
Iron: 36 ug/dL — ABNORMAL LOW (ref 41–142)
Saturation Ratios: 7 % — ABNORMAL LOW (ref 21–57)
TIBC: 553 ug/dL — ABNORMAL HIGH (ref 236–444)
UIBC: 517 ug/dL — ABNORMAL HIGH (ref 120–384)

## 2021-07-22 LAB — FERRITIN: Ferritin: 4 ng/mL — ABNORMAL LOW (ref 11–307)

## 2021-07-22 MED ORDER — SODIUM CHLORIDE 0.9 % IV SOLN: Freq: Once | INTRAVENOUS | Status: AC

## 2021-07-22 MED ORDER — SODIUM CHLORIDE 0.9 % IV SOLN
300.0000 mg | Freq: Once | INTRAVENOUS | Status: AC
  Administered 2021-07-22: 300 mg via INTRAVENOUS
  Filled 2021-07-22: qty 300

## 2021-07-22 NOTE — Patient Instructions (Signed)

## 2021-07-24 ENCOUNTER — Telehealth: Payer: Self-pay | Admitting: Hematology and Oncology

## 2021-07-24 NOTE — Telephone Encounter (Signed)
Left message with upcoming appointment per patient's request.

## 2021-07-25 ENCOUNTER — Ambulatory Visit (INDEPENDENT_AMBULATORY_CARE_PROVIDER_SITE_OTHER): Payer: BC Managed Care – PPO | Admitting: Physician Assistant

## 2021-07-25 ENCOUNTER — Encounter: Payer: Self-pay | Admitting: Physician Assistant

## 2021-07-25 ENCOUNTER — Encounter: Payer: Self-pay | Admitting: Hematology and Oncology

## 2021-07-25 ENCOUNTER — Encounter: Payer: Self-pay | Admitting: Family Medicine

## 2021-07-25 VITALS — BP 130/80 | HR 72 | Ht 60.25 in | Wt 138.4 lb

## 2021-07-25 DIAGNOSIS — R1310 Dysphagia, unspecified: Secondary | ICD-10-CM | POA: Diagnosis not present

## 2021-07-25 DIAGNOSIS — D509 Iron deficiency anemia, unspecified: Secondary | ICD-10-CM | POA: Diagnosis not present

## 2021-07-25 DIAGNOSIS — K5900 Constipation, unspecified: Secondary | ICD-10-CM

## 2021-07-25 NOTE — Progress Notes (Signed)
Chief Complaint: Anemia  HPI:    Jennifer [name] is a 65 year old female with a past medical history of breast cancer and multiple others listed below, known to Dr. Tarri Glenn, who was referred to me by Tower, [name], MD for a complaint of anemia.    11/09/2018 EGD with mild reflux, gastritis and fundic gland polyps, no H. pylori, Barrett's dysplasia or malignancy.  Colonoscopy the same day with diverticulosis and hemorrhoids.  Random biopsies negative.    07/23/2020 patient seen in clinic and at that time wanted to establish care with Dr. Tarri Glenn.  Was on Pantoprazole 40 mg once daily for reflux and on Famotidine 40 twice a day for some urticaria.  After last colonoscopy she had trouble with constipation and some fecal smearing.  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  At that time discussed trying to stop Pantoprazole, recommended a probiotic.    09/12/2020 EUS for CT scan and MRI showing dilated bile duct and elevation alk phos, there was mild nonspecific gastritis, numerous gastric polyps, normal major papula, slightly dated CBD at 8 mm.    10/28/2020 office visit Dr. Tarri Glenn.  At that time discussed her abnormal bile duct.  There is no cause identified on EUS as above, recommend we follow her liver enzymes and consider follow-up MRI/MRCP in 6 months.    06/13/2021 CMP with normal LFTs.    07/22/2021 CBC with a hemoglobin of 10.7 (10.9 on 7/22, 11.8 on 10/08/2020).  MCV low at 76.7.  Ferritin low at 4.  Iron studies with an iron low at 36, percent saturation low at 7.    Today, the patient tells me that she was recently told that she was anemic, she knew something was wrong when she was getting short of breath just walking short distances or up a short distance of stairs, also with increased fatigue and some dizziness.  She went to see her PCP who ran labs and found that she was anemic, she was started on oral iron but after 4 days of this became severely constipated so much so that she had to disimpact herself.  She  discontinued this and is now getting iron infusions, but she received her first iron infusion 2 days ago and is due to get another 1 tomorrow and then another on September 12.  Tells me she has continued with constipation and is using stool softeners but does not feel like they really help.  Reminds me that she is a dietitian and on a very healthy diet.  Also tells me she gains weight without trying at all and regardless of her very healthy diet.  Does tell me she does not exercise very often though.    Also complains of some difficulty swallowing.    Denies fever, chills, weight loss, blood in her stool, melena, abdominal pain or symptoms that awaken her from sleep.  Past Medical History:  Diagnosis Date   Allergic rhinitis    Anxiety    Arthritis    hands, knees   Breast cancer ([place]) 2019   Right Breast Cancer   Cancer ([place]) 09/2018   Breast CA new DX, right breast   Cervical dysplasia    Common bile duct dilation    Endometriosis    Esophageal reflux    Family history of adverse reaction to anesthesia    younger sister anxiety for a dew days after   Family history of breast cancer    Family history of breast cancer    Family history of  prostate cancer    Hepatic cyst 08/2020   multiple   Hepatic hemangioma 08/2020   intrahepatic hemangioma   Hiatal hernia    Hypothyroid    Migraine with aura, without mention of intractable migraine without mention of status migrainosus    Osteopenia 08/2019   T score -2.2 FRAX 11% / 1.7%   Personal history of radiation therapy 2019   Right Breast Cancer   Pre-diabetes     Past Surgical History:  Procedure Laterality Date   BIOPSY  09/12/2020   Procedure: BIOPSY;  Surgeon: [name], MD;  Location: [facility];  Service: Endoscopy;;   BREAST EXCISIONAL BIOPSY Bilateral over 10 years ago   benign x 2    BREAST LUMPECTOMY Right 10/10/2018   BREAST LUMPECTOMY WITH RADIOACTIVE SEED AND SENTINEL LYMPH NODE BIOPSY Right 10/10/2018    Procedure: RIGHT BREAST LUMPECTOMY WITH RADIOACTIVE SEED AND RIGHT SENTINEL LYMPH NODE BIOPSY;  Surgeon: [name], MD;  Location: [facility];  Service: General;  Laterality: Right;   BREAST SURGERY     Benign breast lump excised   CERVICAL CONE BIOPSY  1985   severe dysplasia   COLONOSCOPY  2005 and dec 2019   normal   COLPOSCOPY     endoscopy  10/2018   ESOPHAGOGASTRODUODENOSCOPY (EGD) WITH PROPOFOL N/A 09/12/2020   Procedure: ESOPHAGOGASTRODUODENOSCOPY (EGD) WITH PROPOFOL;  Surgeon: [name], MD;  Location: [facility];  Service: Endoscopy;  Laterality: N/A;   EUS N/A 09/12/2020   Procedure: UPPER ENDOSCOPIC ULTRASOUND (EUS) RADIAL;  Surgeon: [name], MD;  Location: [facility];  Service: Endoscopy;  Laterality: N/A;   LAPAROSCOPIC ASSISTED VAGINAL HYSTERECTOMY     LAPAROSCOPIC BILATERAL SALPINGO OOPHERECTOMY Bilateral 10/27/2019   Procedure: LAPAROSCOPIC BILATERAL SALPINGO OOPHORECTOMY WITH PERITONEAL WASHINGS;  Surgeon: Princess Bruins, MD;  Location: [facility];  Service: Gynecology;  Laterality: Bilateral;  request 1:00pm on Friday, Dec. 4th in [place] time held requests one hour OR time   moles removed from upper back, face lft, 1 between breasts, upper leg left inside  10/18/2019   wearing small round bandaids on for 2 weeks   ROTATOR CUFF REPAIR Bilateral 08/2008    Current Outpatient Medications  Medication Sig Dispense Refill   ALPRAZolam (XANAX) 0.5 MG tablet TAKE 1/2 TO 1 TABLET BY MOUTH ONCE DAILY AS NEEDED FOR SLEEP/ANXIETY 15 tablet 1   atenolol (TENORMIN) 25 MG tablet Take 12.5 mg by mouth 3 (three) times daily. For migraine prevention     bisacodyl (DULCOLAX) 5 MG EC tablet Take 5 mg by mouth daily as needed for moderate constipation.     CALCIUM CARBONATE-VITAMIN D PO Take 1 tablet by mouth daily. 1000 mg     famotidine (PEPCID) 40 MG tablet Take 40 mg by mouth 2 (two) times daily.       hydrocortisone 2.5 % cream Apply to affected areas BID PRN as directed. 30 g 3   hydrOXYzine (ATARAX/VISTARIL) 25 MG tablet Take 1 tablet (25 mg total) by mouth 3 (three) times daily as needed. (Patient not taking: Reported on 07/07/2021) 30 tablet 0   iron polysaccharides (NIFEREX) 150 MG capsule Take 1 capsule (150 mg total) by mouth daily. 30 capsule 3   ketorolac (TORADOL) 10 MG tablet Take 1 tablet (10 mg total) by mouth every 6 (six) hours as needed for moderate pain. (Patient not taking: Reported on 07/07/2021) 12 tablet 0   letrozole (FEMARA) 2.5 MG tablet TAKE 1 TABLET BY  MOUTH EVERY DAY 90 tablet 3   levocetirizine (XYZAL) 5 MG tablet Take 5 mg by mouth at bedtime.   3   Magnesium Oxide 400 (240 Mg) MG TABS Take 400 mg by mouth daily.      metFORMIN (GLUCOPHAGE XR) 500 MG 24 hr tablet Take 1 tablet (500 mg total) by mouth daily with breakfast. 90 tablet 3   Multiple Vitamin (MULTIVITAMIN WITH MINERALS) TABS tablet Take 1 tablet by mouth daily.     Omega-3 Fatty Acids (FISH OIL) 1000 MG CAPS Take 1,000 mg by mouth daily.      OnabotulinumtoxinA (BOTOX IJ) Inject 1 Dose as directed every 6 (six) weeks.      ondansetron (ZOFRAN ODT) 4 MG disintegrating tablet Take 1 tablet (4 mg total) by mouth every 8 (eight) hours as needed. 20 tablet 0   pantoprazole (PROTONIX) 40 MG tablet TAKE ONE TABLET BY MOUTH ONE TIME DAILY 90 tablet 0   PRESCRIPTION MEDICATION Inject into the skin every 6 (six) weeks. Steroid Trigger     Probiotic Product (PROBIOTIC-10 PO) Take 1 capsule by mouth daily.      promethazine (PHENERGAN) 25 MG tablet Take 12.5 mg by mouth daily as needed for nausea or vomiting. When having a migraine     rizatriptan (MAXALT) 10 MG tablet Take 1 tablet (10 mg total) by mouth once as needed for up to 10 days for migraine. May repeat in 2 hours if needed 10 tablet 0   SYNTHROID 75 MCG tablet TAKE ONE TABLET BY MOUTH DAILY BEFORE BREAKFAST 30 tablet 2   triamcinolone (KENALOG) 0.1 % Apply to  affected areas BID PRN for up to two weeks at a time as directed. 45 g 3   TURMERIC PO Take 1 tablet by mouth daily.      No current facility-administered medications for this visit.    Allergies as of 07/25/2021 - Review Complete 07/22/2021  Allergen Reaction Noted   Ciprofloxacin Other (See Comments) 09/21/2018   Codeine phosphate Other (See Comments) 02/07/2007   Decongestant [pseudoephedrine] Other (See Comments) 09/21/2018   Flonase [fluticasone propionate] Other (See Comments) 11/13/2014   Hydrocodone Other (See Comments) 11/27/2008   Macrobid [nitrofurantoin]  01/22/2021   Reglan [metoclopramide] Other (See Comments) 09/23/2018   Topamax [topiramate] Other (See Comments) 09/21/2018   Benadryl [diphenhydramine hcl] Palpitations 10/18/2017   Sulfa antibiotics Anxiety 09/05/2014    Family History  Problem Relation Age of Onset   Breast cancer Mother 60   Diabetes Mother    Hypertension Father    Heart disease Father    Osteoporosis Maternal Grandmother    Diabetes Maternal Grandmother    Hypertension Sister    COPD Paternal Grandmother    Heart disease Paternal Grandfather    Breast cancer Cousin 96       pat first cousin   Breast cancer Cousin        mat second cousin with breast cancer in her 25s   Prostate cancer Other 38   Thyroid cancer Cousin 52       pat first cousin   Colon cancer Neg Hx    Stomach cancer Neg Hx    Esophageal cancer Neg Hx    Pancreatic cancer Neg Hx     Social History   Socioeconomic History   Marital status: Widowed    Spouse name: Not on file   Number of children: Not on file   Years of education: Not on file   Highest education level: Not on  file  Occupational History   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   Social Determinants of Health   Financial Resource Strain: Not on file  Food Insecurity: Not on file  Transportation Needs: Not on file  Physical Activity: Not on file  Stress: Not on file  Social Connections: Not on file  Intimate Partner Violence: Not on file    Review of Systems:    Constitutional: No weight loss, fever or chills Cardiovascular: No chest pain Respiratory: +DOE Gastrointestinal: See HPI and otherwise negative Genitourinary: No dysuria  Neurological: +dizziness Musculoskeletal: No new muscle or joint pain Hematologic: No bleeding or bruising Psychiatric: No history of depression or anxiety   Physical Exam:  Vital signs: BP 130/80 (BP Location: Left Arm, Patient Position: Sitting, Cuff Size: Normal)   Pulse 72   Ht 5' 0.25" (1.53 m) Comment: height measured without shoes  Wt 138 lb 6 oz (62.8 kg)   BMI 26.80 kg/m    Constitutional:   Pleasant Caucasian female appears to be in NAD, Well developed, Well nourished, alert and cooperative Respiratory: Respirations even and unlabored. Lungs clear to auscultation bilaterally.   No wheezes, crackles, or rhonchi.  Cardiovascular: Normal S1, S2. No MRG. Regular rate and rhythm. No peripheral edema, cyanosis or pallor.  Gastrointestinal:  Soft, nondistended, nontender. No rebound or guarding. Normal bowel sounds. No appreciable masses or hepatomegaly. Rectal:  Not performed.  Psychiatric: Demonstrates good judgement and reason without abnormal affect or behaviors.  RELEVANT LABS AND IMAGING: CBC    Component Value Date/Time   WBC 6.6 07/22/2021 1421   WBC 5.6 06/13/2021 0900   RBC 4.33 07/22/2021 1421   HGB 10.7 (L) 07/22/2021 1421   HGB 14.1 03/17/2015  0608   HCT 33.2 (L) 07/22/2021 1421   HCT 40.7 03/17/2015 0608   PLT 340 07/22/2021 1421   PLT 255 03/17/2015 0608   MCV 76.7 (L) 07/22/2021 1421   MCV 92 03/17/2015 0608   MCH 24.7 (L) 07/22/2021 1421   MCHC 32.2 07/22/2021 1421   RDW 16.0 (H) 07/22/2021 1421   RDW 12.3 03/17/2015 0608   LYMPHSABS 2.2 07/22/2021 1421   LYMPHSABS 0.3 (L) 03/17/2015 0608   MONOABS 0.7 07/22/2021 1421   MONOABS 0.3 03/17/2015 0608   EOSABS 0.2 07/22/2021 1421   EOSABS 0.0 03/17/2015 0608   BASOSABS 0.1 07/22/2021 1421   BASOSABS 0.0 03/17/2015 0608    CMP     Component Value Date/Time   NA 137 06/13/2021 0900   NA 140 03/17/2015 0608   K 4.7 06/13/2021 0900   K 3.5 03/17/2015 0608   CL 101 06/13/2021 0900   CL 103 03/17/2015 0608   CO2 29 06/13/2021 0900   CO2 26 03/17/2015 0608   GLUCOSE 98 06/13/2021 0900   GLUCOSE 126 (H) 03/17/2015 0608   BUN 14 06/13/2021 0900   BUN 13 03/17/2015 0608   CREATININE 0.74 06/13/2021 0900   CREATININE 0.83 09/21/2018 1234   CREATININE  0.72 03/17/2015 0608   CALCIUM 9.4 06/13/2021 0900   CALCIUM 8.8 (L) 03/17/2015 0608   PROT 6.7 06/13/2021 0900   PROT 7.6 03/17/2015 0608   ALBUMIN 4.1 06/13/2021 0900   ALBUMIN 4.4 03/17/2015 0608   AST 15 06/13/2021 0900   AST 22 09/21/2018 1234   ALT 18 06/13/2021 0900   ALT 32 09/21/2018 1234   ALT 23 03/17/2015 0608   ALKPHOS 116 06/13/2021 0900   ALKPHOS 48 03/17/2015 0608   BILITOT 0.4 06/13/2021 0900   BILITOT 0.6 09/21/2018 1234   GFRNONAA >60 10/08/2020 0226   GFRNONAA >60 09/21/2018 1234   GFRNONAA >60 03/17/2015 0608   GFRAA >60 11/06/2019 1234   GFRAA >60 09/21/2018 1234   GFRAA >60 03/17/2015 0608    Assessment: 1.  IDA: New for the patient, now receiving iron infusions, history of breast cancer, change in bowel habits towards constipation and some new dysphagia; consider GI source versus other 2.  Dysphagia: Trouble swallowing; consider stricture versus other 3.  Constipation: Worsened  since starting oral iron which she has since discontinued; some element of chronic constipation 4.  Dilated CBD: History of dilated CBD with no explanation found on EUS, alk phos was elevated then as well which is now normal  Plan: 1.  Patient to continue following with her PCP in regards to Feraheme infusions. 2.  Scheduled patient for diagnostic EGD and colonoscopy in the [facility] with Dr. Tarri Glenn.  Did provide the patient with a detailed list of risks for the procedures and she agrees to proceed. Patient is appropriate for endoscopic procedure(s) in the ambulatory ([facility]) setting.  (Of note patient was offered procedures next week but declined due to having her house fixed and also declined the next week due to going to see her mother) 3.  Recommend the patient start MiraLAX twice daily over the next 3 days, she can titrate down or up as needed from there. 4.  Patient was given a 2-day bowel prep but became very discouraged over having to do this and was not sure how she would make it through.  Explained that if she can get her bowels moving better with MiraLAX before then then likely we can do the normal bowel prep.  Discussed that if she is not clean at time of colonoscopy the worse case scenario is that she will have to have her procedure rescheduled with a better bowel prep.  She verbalized understanding. 5.  May also need to work consider repeat MRI/MRCP for known dilated bile duct if procedures are normal. 6.  Patient to follow in clinic per recommendations from Dr. Tarri Glenn after time of procedures.  Ellouise Newer, PA-C [facility] 07/25/2021, 11:03 AM  Cc: Tower, [name], MD

## 2021-07-25 NOTE — Patient Instructions (Addendum)
Start Miralax 1 capful in 8 ounces of liquid twice daily.   Come to the office on Monday 08/18/21 for a CBC. Lab is located in the basement of our building.   You have been scheduled for an endoscopy and colonoscopy. Please follow the written instructions given to you at your visit today. Please pick up your prep supplies at the pharmacy within the next 1-3 days. If you use inhalers (even only as needed), please bring them with you on the day of your procedure.  If you are age 87 or older, your body mass index should be between 23-30. Your Body mass index is 26.8 kg/m. If this is out of the aforementioned range listed, please consider follow up with your Primary Care Provider.  If you are age 65 or younger, your body mass index should be between 19-25. Your Body mass index is 26.8 kg/m. If this is out of the aformentioned range listed, please consider follow up with your Primary Care Provider.   __________________________________________________________  The [facility] providers would like to encourage you to use [facility] to communicate with providers for non-urgent requests or questions.  Due to long hold times on the telephone, sending your provider a message by [facility] may be a faster and more efficient way to get a response.  Please allow 48 business hours for a response.  Please remember that this is for non-urgent requests.

## 2021-07-26 ENCOUNTER — Inpatient Hospital Stay: Payer: BC Managed Care – PPO | Attending: Hematology and Oncology

## 2021-07-26 ENCOUNTER — Other Ambulatory Visit: Payer: Self-pay

## 2021-07-26 VITALS — BP 114/68 | HR 66 | Temp 98.0°F | Resp 18

## 2021-07-26 DIAGNOSIS — D509 Iron deficiency anemia, unspecified: Secondary | ICD-10-CM | POA: Diagnosis not present

## 2021-07-26 MED ORDER — SODIUM CHLORIDE 0.9 % IV SOLN
300.0000 mg | Freq: Once | INTRAVENOUS | Status: AC
  Administered 2021-07-26: 300 mg via INTRAVENOUS
  Filled 2021-07-26: qty 300

## 2021-07-26 MED ORDER — SODIUM CHLORIDE 0.9 % IV SOLN
Freq: Once | INTRAVENOUS | Status: AC
Start: 2021-07-26 — End: 2021-07-26

## 2021-07-26 NOTE — Patient Instructions (Signed)

## 2021-07-27 ENCOUNTER — Other Ambulatory Visit: Payer: Self-pay | Admitting: Family Medicine

## 2021-07-29 ENCOUNTER — Telehealth: Payer: Self-pay | Admitting: Physician Assistant

## 2021-07-29 NOTE — Telephone Encounter (Signed)
Pls call pt. She is inquiring if she could have EGD only before 08/20/21. She is scheduled for ECL on that date but states that dysphagia is getting worse she would prefer to have EGD done asap.

## 2021-07-29 NOTE — Progress Notes (Signed)
Pt returned call. Inquired about prep. Pt confirmed she has begun Miralax as advised and does not have any concerns or questions about her prep. Pt confirmed she will follow prep instructions as ordered. Pt did express having concerns about swallowing her Calcium and MVI tablets. Educated that she may try a chewable alternative until her EGD has been completed.

## 2021-07-29 NOTE — Progress Notes (Signed)
Please check in with the patient today to insure adequate bowel prep for the procedures 07/30/21. Thanks.

## 2021-07-29 NOTE — Telephone Encounter (Signed)
Spoke with patient, she reports having continued difficulty swallowing her medications and would like to proceed with EGD first. She has been scheduled for an EGD tomorrow at 3 PM with Dr. Payton Emerald. Pt is aware that she will need to arrive by 2 PM with a care partner. Pt is aware that I will send EGD instructions to her my chart for her review. Pt is aware that her colonoscopy is still as scheduled for 08/20/21. Pt verbalized understanding of all information and had no concerns at the end of the call.  Secure staff message sent to [facility] in regards to procedure changes.

## 2021-07-29 NOTE — Progress Notes (Signed)
At [facility] request, called pt to inquire further about her prep. LVM requesting returned call.

## 2021-07-30 ENCOUNTER — Ambulatory Visit: Payer: BC Managed Care – PPO

## 2021-07-30 ENCOUNTER — Encounter: Payer: BC Managed Care – PPO | Admitting: Gastroenterology

## 2021-07-30 ENCOUNTER — Other Ambulatory Visit: Payer: Self-pay

## 2021-07-30 ENCOUNTER — Ambulatory Visit (AMBULATORY_SURGERY_CENTER): Payer: BC Managed Care – PPO | Admitting: Gastroenterology

## 2021-07-30 ENCOUNTER — Telehealth: Payer: Self-pay

## 2021-07-30 ENCOUNTER — Inpatient Hospital Stay: Payer: BC Managed Care – PPO

## 2021-07-30 ENCOUNTER — Encounter: Payer: Self-pay | Admitting: Hematology and Oncology

## 2021-07-30 ENCOUNTER — Encounter: Payer: Self-pay | Admitting: Gastroenterology

## 2021-07-30 VITALS — BP 144/72 | HR 58 | Temp 97.8°F | Resp 11 | Ht 60.0 in | Wt 138.0 lb

## 2021-07-30 DIAGNOSIS — D509 Iron deficiency anemia, unspecified: Secondary | ICD-10-CM

## 2021-07-30 DIAGNOSIS — K317 Polyp of stomach and duodenum: Secondary | ICD-10-CM

## 2021-07-30 DIAGNOSIS — R1310 Dysphagia, unspecified: Secondary | ICD-10-CM

## 2021-07-30 DIAGNOSIS — K449 Diaphragmatic hernia without obstruction or gangrene: Secondary | ICD-10-CM

## 2021-07-30 DIAGNOSIS — K319 Disease of stomach and duodenum, unspecified: Secondary | ICD-10-CM | POA: Diagnosis not present

## 2021-07-30 MED ORDER — SODIUM CHLORIDE 0.9 % IV SOLN
500.0000 mL | Freq: Once | INTRAVENOUS | Status: DC
Start: 2021-07-30 — End: 2021-10-15

## 2021-07-30 NOTE — Progress Notes (Signed)
Pt in recovery with monitors in place, VSS. Report given to receiving RN. Bite guard was placed with pt awake to ensure comfort. No dental or soft tissue damage noted. RN will remove the guard when the pt is awake.  

## 2021-07-30 NOTE — Op Note (Signed)
[facility] Patient Name: Jennifer [name] Procedure Date: 07/30/2021 2:34 PM MRN: [id number] Endoscopist: Thornton Park MD, MD Age: 65 Referring MD:  Date of Birth: 30-May-1956 Gender: Female Account #: [id number] Procedure:                Upper GI endoscopy Indications:              Dysphagia Medicines:                Monitored Anesthesia Care Procedure:                Pre-Anesthesia Assessment:                           - Prior to the procedure, a History and Physical                            was performed, and patient medications and                            allergies were reviewed. The patient's tolerance of                            previous anesthesia was also reviewed. The risks                            and benefits of the procedure and the sedation                            options and risks were discussed with the patient.                            All questions were answered, and informed consent                            was obtained. Prior Anticoagulants: The patient has                            taken no previous anticoagulant or antiplatelet                            agents. ASA Grade Assessment: II - A patient with                            mild systemic disease. After reviewing the risks                            and benefits, the patient was deemed in                            satisfactory condition to undergo the procedure.                           After obtaining informed consent, the endoscope was  passed under direct vision. Throughout the                            procedure, the patient's blood pressure, pulse, and                            oxygen saturations were monitored continuously. The                            Endoscope was introduced through the mouth, and                            advanced to the third part of duodenum. The upper                            GI endoscopy was accomplished without  difficulty.                            The patient tolerated the procedure well. Scope In: Scope Out: Findings:                 No endoscopic abnormality was evident in the                            esophagus to explain the patient's complaint of                            dysphagia. It was decided, however, to proceed with                            dilation of the middle third of the esophagus and                            of the lower third of the esophagus. A TTS dilator                            was passed through the scope. Dilation with a                            16-17-18 mm balloon dilator was performed to 18 mm.                            There was no resistance to a fully inflated                            balloon. I was able to pull the fully inflated                            balloon through the mid and distal esophagus. The                            dilation site was examined and showed no change.  Biopsies were then obtained from the mid/proximal                            and distal esophagus with cold forceps for                            histology of suspected eosinophilic esophagitis.                            Estimated blood loss was minimal.                           Multiple medium pedunculated and sessile polyps                            with no bleeding and no stigmata of recent bleeding                            were found in the gastric fundus and in the gastric                            body. Biopsies were taken with a cold forceps for                            histology. Estimated blood loss was minimal.                           The remainder of the examined stomach mucosa was                            normal. Biopsies were taken from the antrum, body,                            and fundus with a cold forceps for histology.                            Estimated blood loss was minimal.                           A large  hiatal hernia was present.                           The examined duodenum was normal. Complications:            No immediate complications. Estimated blood loss:                            Minimal. Estimated Blood Loss:     Estimated blood loss was minimal. Impression:               - No endoscopic esophageal abnormality to explain                            patient's dysphagia. Esophagus dilated. Dilated.  Biopsied.                           - Multiple gastric polyps. Biopsied.                           - Normal stomach. Biopsied.                           - Large hiatal hernia.                           - Normal examined duodenum.                           - Source of dysphagia not identified on this                            procedure. Recommendation:           - Patient has a contact number available for                            emergencies. The signs and symptoms of potential                            delayed complications were discussed with the                            patient. Return to normal activities tomorrow.                            Written discharge instructions were provided to the                            patient.                           - Resume previous diet.                           - Continue present medications.                           - Await pathology results.                           - Barium esophagram if biopsies are negative. Thornton Park MD, MD 07/30/2021 3:24:24 PM This report has been signed electronically.

## 2021-07-30 NOTE — Progress Notes (Signed)
VS completed by CW.   Medical history reviewed and updated.

## 2021-07-30 NOTE — Progress Notes (Signed)
Referring Provider: Abner Greenspan, MD Primary Care Physician:  Tower, [name], MD  Reason for Procedure:  Iron deficiency anemia, dysphagia   IMPRESSION:  Iron deficiency anemia Dysphagia  She is an appropriate candidate for endoscopic evaluation in the [facility] today.     PLAN: EGD in the [facility] today Colonoscopy at a later date   HPI: Jennifer [name] is a 65 y.o. female presents for upper endoscopy to evaluate for new unexplained anemia. She has chronic dysphagia. Please see Jennifer [name] 07/25/21 office visit. EGD and colonoscopy recommended at that time. Patient called back to move up her EGD due to progressive dysphagia.    Past Medical History:  Diagnosis Date   Allergic rhinitis    Anxiety    Arthritis    hands, knees   Breast cancer ([place]) 2019   Right Breast Cancer   Cancer ([place]) 09/2018   Breast CA new DX, right breast   Cervical dysplasia    Common bile duct dilation    Endometriosis    Esophageal reflux    Family history of adverse reaction to anesthesia    younger sister anxiety for a dew days after   Family history of breast cancer    Family history of breast cancer    Family history of prostate cancer    Hepatic cyst 08/2020   multiple   Hepatic hemangioma 08/2020   intrahepatic hemangioma   Hiatal hernia    Hypothyroid    Migraine with aura, without mention of intractable migraine without mention of status migrainosus    Osteopenia 08/2019   T score -2.2 FRAX 11% / 1.7%   Personal history of radiation therapy 2019   Right Breast Cancer   Pre-diabetes     Past Surgical History:  Procedure Laterality Date   BIOPSY  09/12/2020   Procedure: BIOPSY;  Surgeon: [name], MD;  Location: [facility];  Service: Endoscopy;;   BREAST EXCISIONAL BIOPSY Bilateral over 10 years ago   benign x 2    BREAST LUMPECTOMY Right 10/10/2018   BREAST LUMPECTOMY WITH RADIOACTIVE SEED AND SENTINEL LYMPH NODE BIOPSY Right 10/10/2018   Procedure: RIGHT BREAST  LUMPECTOMY WITH RADIOACTIVE SEED AND RIGHT SENTINEL LYMPH NODE BIOPSY;  Surgeon: [name], MD;  Location: [facility];  Service: General;  Laterality: Right;   BREAST SURGERY     Benign breast lump excised   CERVICAL CONE BIOPSY  1985   severe dysplasia   COLONOSCOPY  2005 and dec 2019   normal   COLPOSCOPY     endoscopy  10/2018   ESOPHAGOGASTRODUODENOSCOPY (EGD) WITH PROPOFOL N/A 09/12/2020   Procedure: ESOPHAGOGASTRODUODENOSCOPY (EGD) WITH PROPOFOL;  Surgeon: [name], MD;  Location: [facility];  Service: Endoscopy;  Laterality: N/A;   EUS N/A 09/12/2020   Procedure: UPPER ENDOSCOPIC ULTRASOUND (EUS) RADIAL;  Surgeon: [name], MD;  Location: [facility];  Service: Endoscopy;  Laterality: N/A;   LAPAROSCOPIC ASSISTED VAGINAL HYSTERECTOMY     LAPAROSCOPIC BILATERAL SALPINGO OOPHERECTOMY Bilateral 10/27/2019   Procedure: LAPAROSCOPIC BILATERAL SALPINGO OOPHORECTOMY WITH PERITONEAL WASHINGS;  Surgeon: Princess Bruins, MD;  Location: [facility];  Service: Gynecology;  Laterality: Bilateral;  request 1:00pm on Friday, Dec. 4th in [place] time held requests one hour OR time   moles removed from upper back, face lft, 1 between breasts, upper leg left inside  10/18/2019   wearing small round bandaids on for 2 weeks   ROTATOR CUFF REPAIR Bilateral 08/2008  Current Outpatient Medications  Medication Sig Dispense Refill   ALPRAZolam (XANAX) 0.5 MG tablet TAKE 1/2 TO 1 TABLET BY MOUTH ONCE DAILY AS NEEDED FOR SLEEP/ANXIETY 15 tablet 1   atenolol (TENORMIN) 25 MG tablet Take 12.5 mg by mouth 3 (three) times daily. For migraine prevention     bisacodyl (DULCOLAX) 5 MG EC tablet Take 5 mg by mouth daily as needed for moderate constipation.     CALCIUM CARBONATE-VITAMIN D PO Take 1 tablet by mouth daily. 1000 mg     famotidine (PEPCID) 40 MG tablet Take 40 mg by mouth 2 (two) times daily.      hydrocortisone 2.5 % cream Apply to  affected areas BID PRN as directed. 30 g 3   hydrOXYzine (ATARAX/VISTARIL) 25 MG tablet Take 1 tablet (25 mg total) by mouth 3 (three) times daily as needed. 30 tablet 0   ketorolac (TORADOL) 10 MG tablet Take 1 tablet (10 mg total) by mouth every 6 (six) hours as needed for moderate pain. 12 tablet 0   letrozole (FEMARA) 2.5 MG tablet TAKE 1 TABLET BY MOUTH EVERY DAY 90 tablet 3   levocetirizine (XYZAL) 5 MG tablet Take 5 mg by mouth at bedtime.   3   Magnesium Oxide 400 (240 Mg) MG TABS Take 400 mg by mouth daily.      metFORMIN (GLUCOPHAGE XR) 500 MG 24 hr tablet Take 1 tablet (500 mg total) by mouth daily with breakfast. 90 tablet 3   Multiple Vitamin (MULTIVITAMIN WITH MINERALS) TABS tablet Take 1 tablet by mouth daily.     Omega-3 Fatty Acids (FISH OIL) 1000 MG CAPS Take 1,000 mg by mouth daily.      OnabotulinumtoxinA (BOTOX IJ) Inject 1 Dose as directed every 6 (six) weeks.      ondansetron (ZOFRAN ODT) 4 MG disintegrating tablet Take 1 tablet (4 mg total) by mouth every 8 (eight) hours as needed. 20 tablet 0   pantoprazole (PROTONIX) 40 MG tablet TAKE ONE TABLET BY MOUTH ONE TIME DAILY 90 tablet 0   predniSONE (DELTASONE) 20 MG tablet Take 20 mg by mouth as needed (Migraines).     PRESCRIPTION MEDICATION Inject into the skin every 6 (six) weeks. Steroid Trigger     Probiotic Product (PROBIOTIC-10 PO) Take 1 capsule by mouth daily.      promethazine (PHENERGAN) 25 MG tablet Take 12.5 mg by mouth daily as needed for nausea or vomiting. When having a migraine     rizatriptan (MAXALT) 10 MG tablet Take 1 tablet (10 mg total) by mouth once as needed for up to 10 days for migraine. May repeat in 2 hours if needed 10 tablet 0   SYNTHROID 75 MCG tablet TAKE ONE TABLET BY MOUTH DAILY BEFORE BREAKFAST 30 tablet 2   triamcinolone (KENALOG) 0.1 % Apply to affected areas BID PRN for up to two weeks at a time as directed. 45 g 3   TURMERIC PO Take 1 tablet by mouth daily.      Current  Facility-Administered Medications  Medication Dose Route Frequency Provider Last Rate Last Admin   0.9 %  sodium chloride infusion  500 mL Intravenous Once Thornton Park, MD        Allergies as of 07/30/2021 - Review Complete 07/26/2021  Allergen Reaction Noted   Ciprofloxacin Other (See Comments) 09/21/2018   Codeine phosphate Other (See Comments) 02/07/2007   Decongestant [pseudoephedrine] Other (See Comments) 09/21/2018   Flonase [fluticasone propionate] Other (See Comments) 11/13/2014   Hydrocodone Other (  See Comments) 11/27/2008   Macrobid [nitrofurantoin]  01/22/2021   Reglan [metoclopramide] Other (See Comments) 09/23/2018   Topamax [topiramate] Other (See Comments) 09/21/2018   Benadryl [diphenhydramine hcl] Palpitations 10/18/2017   Sulfa antibiotics Anxiety 09/05/2014    Family History  Problem Relation Age of Onset   Breast cancer Mother 82   Diabetes Mother    Hypertension Father    Heart disease Father    Hypertension Sister    Anuerysm Sister        head   Hypertension Sister    Anxiety disorder Sister    Other Sister        prediabetes   Osteoporosis Maternal Grandmother    Diabetes Maternal Grandmother    COPD Paternal Grandmother    Heart disease Paternal Grandfather    Breast cancer Cousin 51       pat first cousin   Breast cancer Cousin        mat second cousin with breast cancer in her 21s   Thyroid cancer Cousin 57       pat first cousin   Prostate cancer Other 35   Colon cancer Neg Hx    Stomach cancer Neg Hx    Esophageal cancer Neg Hx    Pancreatic cancer Neg Hx      Physical Exam: General:   Alert,  well-nourished, pleasant and cooperative in NAD Head:  Normocephalic and atraumatic. Eyes:  Sclera clear, no icterus.   Conjunctiva pink. Mouth:  No deformity or lesions.   Neck:  Supple; no masses or thyromegaly. Lungs:  Clear throughout to auscultation.   No wheezes. Heart:  Regular rate and rhythm; no murmurs. Abdomen:  Soft,  non-tender, nondistended, normal bowel sounds, no rebound or guarding.  Msk:  Symmetrical. No boney deformities LAD: No inguinal or umbilical LAD Extremities:  No clubbing or edema. Neurologic:  Alert and  oriented x4;  grossly nonfocal Skin:  No obvious rash or bruise. Psych:  Alert and cooperative. Normal mood and affect.     Catriona Dillenbeck L. Tarri Glenn, MD, MPH 07/30/2021, 2:38 PM

## 2021-07-30 NOTE — Patient Instructions (Signed)
Await pathology results from the biopsies taken today.  Information on hiatal hernia given to you today.  Resume previous diet and medications.   YOU HAD AN ENDOSCOPIC PROCEDURE TODAY AT [facility]:   Refer to the procedure report that was given to you for any specific questions about what was found during the examination.  If the procedure report does not answer your questions, please call your gastroenterologist to clarify.  If you requested that your care partner not be given the details of your procedure findings, then the procedure report has been included in a sealed envelope for you to review at your convenience later.  YOU SHOULD EXPECT: Some feelings of bloating in the abdomen. Passage of more gas than usual.  Walking can help get rid of the air that was put into your GI tract during the procedure and reduce the bloating. If you had a lower endoscopy (such as a colonoscopy or flexible sigmoidoscopy) you may notice spotting of blood in your stool or on the toilet paper. If you underwent a bowel prep for your procedure, you may not have a normal bowel movement for a few days.  Please Note:  You might notice some irritation and congestion in your nose or some drainage.  This is from the oxygen used during your procedure.  There is no need for concern and it should clear up in a day or so.  SYMPTOMS TO REPORT IMMEDIATELY:   Following upper endoscopy (EGD)  Vomiting of blood or coffee ground material  New chest pain or pain under the shoulder blades  Painful or persistently difficult swallowing  New shortness of breath  Fever of 100F or higher  Black, tarry-looking stools  For urgent or emergent issues, a gastroenterologist can be reached at any hour by calling [phone]. Do not use MyChart messaging for urgent concerns.    DIET:  We do recommend a small meal at first, but then you may proceed to your regular diet.  Drink plenty of fluids but you should avoid  alcoholic beverages for 24 hours.  ACTIVITY:  You should plan to take it easy for the rest of today and you should NOT DRIVE or use heavy machinery until tomorrow (because of the sedation medicines used during the test).    FOLLOW UP: Our staff will call the number listed on your records 48-72 hours following your procedure to check on you and address any questions or concerns that you may have regarding the information given to you following your procedure. If we do not reach you, we will leave a message.  We will attempt to reach you two times.  During this call, we will ask if you have developed any symptoms of COVID 19. If you develop any symptoms (ie: fever, flu-like symptoms, shortness of breath, cough etc.) before then, please call [phone].  If you test positive for Covid 19 in the 2 weeks post procedure, please call and report this information to [place].    If any biopsies were taken you will be contacted by phone or by letter within the next 1-3 weeks.  Please call us at [phone] if you have not heard about the biopsies in 3 weeks.    SIGNATURES/CONFIDENTIALITY: You and/or your care partner have signed paperwork which will be entered into your electronic medical record.  These signatures attest to the fact that that the information above on your After Visit Summary has been reviewed and is understood.  Full responsibility of the  confidentiality of this discharge information lies with you and/or your care-partner.  

## 2021-07-30 NOTE — Telephone Encounter (Signed)
-----   Message from Thornton Park, MD sent at 07/30/2021  3:13 PM EDT ----- Please arrange for esophagram to evaluate dysphagia and office follow-up with APP 1-2 weeks after the esophagram. Thank you.  KLB

## 2021-07-30 NOTE — Telephone Encounter (Signed)
Following message sent to [facility] Scheduling:  Pt will need scheduled for a follow up with Dr. Tarri Glenn FOLLOWING Esophagram. Please advise once scheduled.  [facility] Phone: [phone] Fax: [phone]   Imaging Ordered: Esophagram  Diagnosis: Dysphagia  Ordering Provider: Dr. Tarri Glenn   Is a Prior Authorization needed? We are in the process of obtaining it now  Is the patient Diabetic? No  Does the patient have Hypertension? No  Does the patient have any implanted devices or hardware? No  Date of last BUN/Creat, if needed? N/A  Patient Weight? 138#  Is the patient able to get on the table? Yes  Has the patient been diagnosed with COVID? No  Is the patient waiting on COVID testing results? No  Thank you for your assistance! [facility]

## 2021-07-30 NOTE — Progress Notes (Signed)
Called to room to assist during endoscopic procedure.  Patient ID and intended procedure confirmed with present staff. Received instructions for my participation in the procedure from the performing physician.  

## 2021-07-31 ENCOUNTER — Other Ambulatory Visit: Payer: Self-pay

## 2021-07-31 ENCOUNTER — Encounter: Payer: BC Managed Care – PPO | Admitting: Gastroenterology

## 2021-07-31 ENCOUNTER — Other Ambulatory Visit (INDEPENDENT_AMBULATORY_CARE_PROVIDER_SITE_OTHER): Payer: BC Managed Care – PPO

## 2021-07-31 DIAGNOSIS — D509 Iron deficiency anemia, unspecified: Secondary | ICD-10-CM

## 2021-07-31 DIAGNOSIS — K5900 Constipation, unspecified: Secondary | ICD-10-CM

## 2021-07-31 DIAGNOSIS — R1310 Dysphagia, unspecified: Secondary | ICD-10-CM

## 2021-07-31 LAB — CBC WITH DIFFERENTIAL/PLATELET
Basophils Absolute: 0 10*3/uL (ref 0.0–0.1)
Basophils Relative: 0.8 % (ref 0.0–3.0)
Eosinophils Absolute: 0.1 10*3/uL (ref 0.0–0.7)
Eosinophils Relative: 2.2 % (ref 0.0–5.0)
HCT: 35.3 % — ABNORMAL LOW (ref 36.0–46.0)
Hemoglobin: 11.2 g/dL — ABNORMAL LOW (ref 12.0–15.0)
Lymphocytes Relative: 23.9 % (ref 12.0–46.0)
Lymphs Abs: 1.4 10*3/uL (ref 0.7–4.0)
MCHC: 31.6 g/dL (ref 30.0–36.0)
MCV: 78.8 fl (ref 78.0–100.0)
Monocytes Absolute: 0.6 10*3/uL (ref 0.1–1.0)
Monocytes Relative: 9.7 % (ref 3.0–12.0)
Neutro Abs: 3.8 10*3/uL (ref 1.4–7.7)
Neutrophils Relative %: 63.4 % (ref 43.0–77.0)
Platelets: 321 10*3/uL (ref 150.0–400.0)
RBC: 4.49 Mil/uL (ref 3.87–5.11)
RDW: 17.1 % — ABNORMAL HIGH (ref 11.5–15.5)
WBC: 6.1 10*3/uL (ref 4.0–10.5)

## 2021-07-31 LAB — IRON: Iron: 66 ug/dL (ref 42–145)

## 2021-07-31 LAB — FERRITIN: Ferritin: 244 ng/mL (ref 10.0–291.0)

## 2021-07-31 NOTE — Addendum Note (Signed)
Addended by: Yevette Edwards on: 07/31/2021 08:46 AM   Modules accepted: Orders

## 2021-08-01 ENCOUNTER — Telehealth: Payer: Self-pay | Admitting: *Deleted

## 2021-08-01 ENCOUNTER — Telehealth: Payer: Self-pay

## 2021-08-01 NOTE — Telephone Encounter (Signed)
Please review the EGD procedure note as the reasons are outlined. Dilation performed for dysphagia without a stricture which is often done to attempt to improve the patient's dysphagia. Any further concerns or questions can be addressed by Dr. Tarri Glenn when she returns on Monday.

## 2021-08-01 NOTE — Telephone Encounter (Signed)
Attempted f/u call back. No answer, left VM. 

## 2021-08-01 NOTE — Telephone Encounter (Signed)
In error

## 2021-08-01 NOTE — Telephone Encounter (Signed)
  Follow up Call-  Call back number 07/30/2021  Post procedure Call Back phone  # [phone]  Permission to leave phone message Yes  Some recent data might be hidden     Patient questions:  Do you have a fever, pain , or abdominal swelling? No. Pain Score  0 *  Have you tolerated food without any problems? Yes.    Have you been able to return to your normal activities? Yes.    Do you have any questions about your discharge instructions: Diet   No. Medications  No. Follow up visit  No.  Do you have questions or concerns about your Care? Yes.   Patient had a question of why dilation was completed, she said she was told that it wasn't done. She said she would just like to talk to Dr. Tarri Glenn about it. Patient stated that she went to her PCP and got labs drawn and everything was normal but is still experiencing some shortness of breath. This note routed to Dr. Tarri Glenn for advice.   Actions: * If pain score is 4 or above: No action needed, pain <4.  Have you developed a fever since your procedure? no  2.   Have you had an respiratory symptoms (SOB or cough) since your procedure? no  3.   Have you tested positive for COVID 19 since your procedure no  4.   Have you had any family members/close contacts diagnosed with the COVID 19 since your procedure?  no   If yes to any of these questions please route to Joylene John, RN and Joella Prince, RN

## 2021-08-02 ENCOUNTER — Ambulatory Visit: Payer: BC Managed Care – PPO

## 2021-08-04 ENCOUNTER — Other Ambulatory Visit: Payer: Self-pay | Admitting: Family Medicine

## 2021-08-04 ENCOUNTER — Inpatient Hospital Stay: Payer: BC Managed Care – PPO

## 2021-08-04 ENCOUNTER — Other Ambulatory Visit: Payer: Self-pay

## 2021-08-04 ENCOUNTER — Encounter: Payer: Self-pay | Admitting: Family Medicine

## 2021-08-04 VITALS — BP 131/75 | HR 66 | Temp 97.9°F | Resp 18

## 2021-08-04 DIAGNOSIS — D509 Iron deficiency anemia, unspecified: Secondary | ICD-10-CM | POA: Diagnosis not present

## 2021-08-04 MED ORDER — SODIUM CHLORIDE 0.9 % IV SOLN
300.0000 mg | Freq: Once | INTRAVENOUS | Status: AC
  Administered 2021-08-04: 300 mg via INTRAVENOUS
  Filled 2021-08-04: qty 300

## 2021-08-04 MED ORDER — ALPRAZOLAM 0.5 MG PO TABS: ORAL_TABLET | ORAL | 1 refills | Status: DC

## 2021-08-04 MED ORDER — SODIUM CHLORIDE 0.9 % IV SOLN: Freq: Once | INTRAVENOUS | Status: AC

## 2021-08-04 NOTE — Patient Instructions (Signed)
[facility]  Discharge Instructions: Thank you for choosing [facility] to provide your oncology and hematology care.   If you have a lab appointment with the [facility], please go directly to the [facility] and check in at the registration area.   Wear comfortable clothing and clothing appropriate for easy access to any Portacath or PICC line.   We strive to give you quality time with your provider. You may need to reschedule your appointment if you arrive late (15 or more minutes).  Arriving late affects you and other patients whose appointments are after yours.  Also, if you miss three or more appointments without notifying the office, you may be dismissed from the clinic at the provider's discretion.      For prescription refill requests, have your pharmacy contact our office and allow 72 hours for refills to be completed.    Today you received the following chemotherapy and/or immunotherapy agents venofer      To help prevent nausea and vomiting after your treatment, we encourage you to take your nausea medication as directed.  BELOW ARE SYMPTOMS THAT SHOULD BE REPORTED IMMEDIATELY: *FEVER GREATER THAN 100.4 F (38 C) OR HIGHER *CHILLS OR SWEATING *NAUSEA AND VOMITING THAT IS NOT CONTROLLED WITH YOUR NAUSEA MEDICATION *UNUSUAL SHORTNESS OF BREATH *UNUSUAL BRUISING OR BLEEDING *URINARY PROBLEMS (pain or burning when urinating, or frequent urination) *BOWEL PROBLEMS (unusual diarrhea, constipation, pain near the anus) TENDERNESS IN MOUTH AND THROAT WITH OR WITHOUT PRESENCE OF ULCERS (sore throat, sores in mouth, or a toothache) UNUSUAL RASH, SWELLING OR PAIN  UNUSUAL VAGINAL DISCHARGE OR ITCHING   Items with * indicate a potential emergency and should be followed up as soon as possible or go to the Emergency Department if any problems should occur.  Please show the CHEMOTHERAPY ALERT CARD or IMMUNOTHERAPY ALERT CARD at check-in to the  Emergency Department and triage nurse.  Should you have questions after your visit or need to cancel or reschedule your appointment, please contact [facility]  Dept: [phone]  and follow the prompts.  Office hours are 8:00 a.m. to 4:30 p.m. Monday - Friday. Please note that voicemails left after 4:00 p.m. may not be returned until the following business day.  We are closed weekends and major holidays. You have access to a nurse at all times for urgent questions. Please call the main number to the clinic Dept: [phone] and follow the prompts.   For any non-urgent questions, you may also contact your provider using MyChart. We now offer e-Visits for anyone 23 and older to request care online for non-urgent symptoms. For details visit [email or web address].   Also download the MyChart app! Go to the app store, search "MyChart", open the app, select [facility], and log in with your MyChart username and password.  Due to Covid, a mask is required upon entering the hospital/clinic. If you do not have a mask, one will be given to you upon arrival. For doctor visits, patients may have 1 support person aged 72 or older with them. For treatment visits, patients cannot have anyone with them due to current Covid guidelines and our immunocompromised population.

## 2021-08-05 ENCOUNTER — Inpatient Hospital Stay: Payer: BC Managed Care – PPO

## 2021-08-08 ENCOUNTER — Encounter: Payer: Self-pay | Admitting: Hematology and Oncology

## 2021-08-14 ENCOUNTER — Inpatient Hospital Stay (HOSPITAL_COMMUNITY): Admission: RE | Admit: 2021-08-14 | Payer: BC Managed Care – PPO | Source: Ambulatory Visit

## 2021-08-15 DIAGNOSIS — M542 Cervicalgia: Secondary | ICD-10-CM | POA: Diagnosis not present

## 2021-08-15 DIAGNOSIS — G43719 Chronic migraine without aura, intractable, without status migrainosus: Secondary | ICD-10-CM | POA: Diagnosis not present

## 2021-08-15 DIAGNOSIS — G43019 Migraine without aura, intractable, without status migrainosus: Secondary | ICD-10-CM | POA: Diagnosis not present

## 2021-08-18 ENCOUNTER — Other Ambulatory Visit (INDEPENDENT_AMBULATORY_CARE_PROVIDER_SITE_OTHER): Payer: BC Managed Care – PPO

## 2021-08-18 ENCOUNTER — Other Ambulatory Visit: Payer: Self-pay

## 2021-08-18 DIAGNOSIS — D509 Iron deficiency anemia, unspecified: Secondary | ICD-10-CM | POA: Diagnosis not present

## 2021-08-19 ENCOUNTER — Other Ambulatory Visit: Payer: Self-pay | Admitting: *Deleted

## 2021-08-19 ENCOUNTER — Other Ambulatory Visit: Payer: Self-pay | Admitting: Obstetrics & Gynecology

## 2021-08-19 DIAGNOSIS — Z853 Personal history of malignant neoplasm of breast: Secondary | ICD-10-CM

## 2021-08-19 DIAGNOSIS — D509 Iron deficiency anemia, unspecified: Secondary | ICD-10-CM

## 2021-08-19 LAB — CBC
HCT: 37.9 % (ref 36.0–46.0)
Hemoglobin: 12.3 g/dL (ref 12.0–15.0)
MCHC: 32.5 g/dL (ref 30.0–36.0)
MCV: 82.6 fl (ref 78.0–100.0)
Platelets: 260 10*3/uL (ref 150.0–400.0)
RBC: 4.59 Mil/uL (ref 3.87–5.11)
RDW: 22.1 % — ABNORMAL HIGH (ref 11.5–15.5)
WBC: 6.9 10*3/uL (ref 4.0–10.5)

## 2021-08-20 ENCOUNTER — Ambulatory Visit (AMBULATORY_SURGERY_CENTER): Payer: BC Managed Care – PPO | Admitting: Gastroenterology

## 2021-08-20 ENCOUNTER — Other Ambulatory Visit: Payer: Self-pay

## 2021-08-20 ENCOUNTER — Encounter: Payer: Self-pay | Admitting: Gastroenterology

## 2021-08-20 VITALS — BP 128/76 | HR 67 | Temp 97.1°F | Resp 18 | Ht 60.0 in | Wt 138.0 lb

## 2021-08-20 DIAGNOSIS — D123 Benign neoplasm of transverse colon: Secondary | ICD-10-CM

## 2021-08-20 DIAGNOSIS — K635 Polyp of colon: Secondary | ICD-10-CM

## 2021-08-20 DIAGNOSIS — D509 Iron deficiency anemia, unspecified: Secondary | ICD-10-CM

## 2021-08-20 MED ORDER — SODIUM CHLORIDE 0.9 % IV SOLN: 500.0000 mL | Freq: Once | INTRAVENOUS | Status: DC

## 2021-08-20 NOTE — Progress Notes (Signed)
Report given to PACU, vss 

## 2021-08-20 NOTE — Patient Instructions (Signed)
YOU HAD AN ENDOSCOPIC PROCEDURE TODAY AT THE [facility]:   Refer to the procedure report that was given to you for any specific questions about what was found during the examination.  If the procedure report does not answer your questions, please call your gastroenterologist to clarify.  If you requested that your care partner not be given the details of your procedure findings, then the procedure report has been included in a sealed envelope for you to review at your convenience later.  YOU SHOULD EXPECT: Some feelings of bloating in the abdomen. Passage of more gas than usual.  Walking can help get rid of the air that was put into your GI tract during the procedure and reduce the bloating. If you had a lower endoscopy (such as a colonoscopy or flexible sigmoidoscopy) you may notice spotting of blood in your stool or on the toilet paper. If you underwent a bowel prep for your procedure, you may not have a normal bowel movement for a few days.  Please Note:  You might notice some irritation and congestion in your nose or some drainage.  This is from the oxygen used during your procedure.  There is no need for concern and it should clear up in a day or so.  SYMPTOMS TO REPORT IMMEDIATELY:  Following lower endoscopy (colonoscopy or flexible sigmoidoscopy):  Excessive amounts of blood in the stool  Significant tenderness or worsening of abdominal pains  Swelling of the abdomen that is new, acute  Fever of 100F or higher   For urgent or emergent issues, a gastroenterologist can be reached at any hour by calling [phone]. Do not use MyChart messaging for urgent concerns.    DIET:  We do recommend a small meal at first, but then you may proceed to your regular diet.  Drink plenty of fluids but you should avoid alcoholic beverages for 24 hours.  MEDICATIONS:  Continue present medications.  Please see handouts given to you by your recovery nurse.  Thank you for allowing us to  provide for your healthcare needs today.  ACTIVITY:  You should plan to take it easy for the rest of today and you should NOT DRIVE or use heavy machinery until tomorrow (because of the sedation medicines used during the test).    FOLLOW UP: Our staff will call the number listed on your records 48-72 hours following your procedure to check on you and address any questions or concerns that you may have regarding the information given to you following your procedure. If we do not reach you, we will leave a message.  We will attempt to reach you two times.  During this call, we will ask if you have developed any symptoms of COVID 19. If you develop any symptoms (ie: fever, flu-like symptoms, shortness of breath, cough etc.) before then, please call [phone].  If you test positive for Covid 19 in the 2 weeks post procedure, please call and report this information to us.    If any biopsies were taken you will be contacted by phone or by letter within the next 1-3 weeks.  Please call us at [phone] if you have not heard about the biopsies in 3 weeks.    SIGNATURES/CONFIDENTIALITY: You and/or your care partner have signed paperwork which will be entered into your electronic medical record.  These signatures attest to the fact that that the information above on your After Visit Summary has been reviewed and is understood.  Full responsibility of the   confidentiality of this discharge information lies with you and/or your care-partner.  

## 2021-08-20 NOTE — Progress Notes (Signed)
Called to room to assist during endoscopic procedure.  Patient ID and intended procedure confirmed with present staff. Received instructions for my participation in the procedure from the performing physician.  

## 2021-08-20 NOTE — Progress Notes (Signed)
VS completed by [place].   No BP/IV sticks on right side.  Medical history reviewed and updated.

## 2021-08-20 NOTE — Progress Notes (Addendum)
Referring Provider: Abner Greenspan, MD Primary Care Physician:  Tower, [name], MD  Reason for Procedure:  Iron deficiency anemia   IMPRESSION:  Unexplained iron deficiency anemia Appropriate candidate for monitored anesthesia care  PLAN: Colonoscopy in the [facility] today   HPI: Jennifer [name] is a 65 y.o. female presents for colonoscopy to evaluate for iron deficiency anemia not explained by EGD 07/30/21. Please see the office note from 07/25/21 for full details.   Iron 36, ferritin 4 07/22/21 Hemoglobin 07/31/21: 11.2 Hemoglolbin 08/19/21: 12.3   Past Medical History:  Diagnosis Date   Allergic rhinitis    Anxiety    Arthritis    hands, knees   Breast cancer ([place]) 2019   Right Breast Cancer   Cancer ([place]) 09/2018   Breast CA new DX, right breast   Cervical dysplasia    Common bile duct dilation    Diabetes mellitus without complication ([place])    Endometriosis    Esophageal reflux    Family history of adverse reaction to anesthesia    younger sister anxiety for a dew days after   Family history of breast cancer    Family history of breast cancer    Family history of prostate cancer    Hepatic cyst 08/2020   multiple   Hepatic hemangioma 08/2020   intrahepatic hemangioma   Hiatal hernia    Hypertension    Hypothyroid    Migraine with aura, without mention of intractable migraine without mention of status migrainosus    Osteopenia 08/2019   T score -2.2 FRAX 11% / 1.7%   Personal history of radiation therapy 2019   Right Breast Cancer   Pre-diabetes     Past Surgical History:  Procedure Laterality Date   BIOPSY  09/12/2020   Procedure: BIOPSY;  Surgeon: [name], MD;  Location: [facility];  Service: Endoscopy;;   BREAST EXCISIONAL BIOPSY Bilateral over 10 years ago   benign x 2    BREAST LUMPECTOMY Right 10/10/2018   BREAST LUMPECTOMY WITH RADIOACTIVE SEED AND SENTINEL LYMPH NODE BIOPSY Right 10/10/2018   Procedure: RIGHT BREAST LUMPECTOMY WITH RADIOACTIVE  SEED AND RIGHT SENTINEL LYMPH NODE BIOPSY;  Surgeon: [name], MD;  Location: [facility];  Service: General;  Laterality: Right;   BREAST SURGERY     Benign breast lump excised   CERVICAL CONE BIOPSY  1985   severe dysplasia   COLONOSCOPY  2005 and dec 2019   normal   COLPOSCOPY     endoscopy  10/2018   ESOPHAGOGASTRODUODENOSCOPY (EGD) WITH PROPOFOL N/A 09/12/2020   Procedure: ESOPHAGOGASTRODUODENOSCOPY (EGD) WITH PROPOFOL;  Surgeon: [name], MD;  Location: [facility];  Service: Endoscopy;  Laterality: N/A;   EUS N/A 09/12/2020   Procedure: UPPER ENDOSCOPIC ULTRASOUND (EUS) RADIAL;  Surgeon: [name], MD;  Location: [facility];  Service: Endoscopy;  Laterality: N/A;   LAPAROSCOPIC ASSISTED VAGINAL HYSTERECTOMY     LAPAROSCOPIC BILATERAL SALPINGO OOPHERECTOMY Bilateral 10/27/2019   Procedure: LAPAROSCOPIC BILATERAL SALPINGO OOPHORECTOMY WITH PERITONEAL WASHINGS;  Surgeon: Princess Bruins, MD;  Location: [facility];  Service: Gynecology;  Laterality: Bilateral;  request 1:00pm on Friday, Dec. 4th in [place] time held requests one hour OR time   moles removed from upper back, face lft, 1 between breasts, upper leg left inside  10/18/2019   wearing small round bandaids on for 2 weeks   ROTATOR CUFF REPAIR Bilateral 08/2008   UPPER GASTROINTESTINAL ENDOSCOPY  Current Outpatient Medications  Medication Sig Dispense Refill   ALPRAZolam (XANAX) 0.5 MG tablet TAKE 1/2 TO 1 TABLET BY MOUTH ONCE DAILY AS NEEDED FOR SLEEP/ANXIETY 15 tablet 1   atenolol (TENORMIN) 25 MG tablet Take 12.5 mg by mouth 3 (three) times daily. For migraine prevention     famotidine (PEPCID) 40 MG tablet Take 40 mg by mouth 2 (two) times daily.      hydrOXYzine (ATARAX/VISTARIL) 25 MG tablet Take 1 tablet (25 mg total) by mouth 3 (three) times daily as needed. 30 tablet 0   letrozole (FEMARA) 2.5 MG tablet TAKE 1 TABLET BY MOUTH EVERY DAY 90  tablet 3   levocetirizine (XYZAL) 5 MG tablet Take 5 mg by mouth at bedtime.   3   levothyroxine (SYNTHROID) 75 MCG tablet TAKE ONE TABLET BY MOUTH ONE TIME DAILY BEFORE BREAKFAST 90 tablet 1   metFORMIN (GLUCOPHAGE XR) 500 MG 24 hr tablet Take 1 tablet (500 mg total) by mouth daily with breakfast. 90 tablet 3   ondansetron (ZOFRAN ODT) 4 MG disintegrating tablet Take 1 tablet (4 mg total) by mouth every 8 (eight) hours as needed. 20 tablet 0   pantoprazole (PROTONIX) 40 MG tablet TAKE ONE TABLET BY MOUTH ONE TIME DAILY 90 tablet 1   PRESCRIPTION MEDICATION Inject into the skin every 6 (six) weeks. Steroid Trigger     bisacodyl (DULCOLAX) 5 MG EC tablet Take 5 mg by mouth daily as needed for moderate constipation.     CALCIUM CARBONATE-VITAMIN D PO Take 1 tablet by mouth daily. 1000 mg     hydrocortisone 2.5 % cream Apply to affected areas BID PRN as directed. 30 g 3   ketorolac (TORADOL) 10 MG tablet Take 1 tablet (10 mg total) by mouth every 6 (six) hours as needed for moderate pain. 12 tablet 0   Magnesium Oxide 400 (240 Mg) MG TABS Take 400 mg by mouth daily.      Multiple Vitamin (MULTIVITAMIN WITH MINERALS) TABS tablet Take 1 tablet by mouth daily.     Omega-3 Fatty Acids (FISH OIL) 1000 MG CAPS Take 1,000 mg by mouth daily.      OnabotulinumtoxinA (BOTOX IJ) Inject 1 Dose as directed every 6 (six) weeks.      predniSONE (DELTASONE) 20 MG tablet Take 20 mg by mouth as needed (Migraines).     Probiotic Product (PROBIOTIC-10 PO) Take 1 capsule by mouth daily.      promethazine (PHENERGAN) 25 MG tablet Take 12.5 mg by mouth daily as needed for nausea or vomiting. When having a migraine     rizatriptan (MAXALT) 10 MG tablet Take 1 tablet (10 mg total) by mouth once as needed for up to 10 days for migraine. May repeat in 2 hours if needed 10 tablet 0   triamcinolone (KENALOG) 0.1 % Apply to affected areas BID PRN for up to two weeks at a time as directed. 45 g 3   TURMERIC PO Take 1 tablet by  mouth daily.  (Patient not taking: Reported on 08/20/2021)     Current Facility-Administered Medications  Medication Dose Route Frequency Provider Last Rate Last Admin   0.9 %  sodium chloride infusion  500 mL Intravenous Once Thornton Park, MD       0.9 %  sodium chloride infusion  500 mL Intravenous Once Thornton Park, MD        Allergies as of 08/20/2021 - Review Complete 08/20/2021  Allergen Reaction Noted   Ciprofloxacin Other (See Comments) 09/21/2018  Codeine phosphate Other (See Comments) 02/07/2007   Decongestant [pseudoephedrine] Other (See Comments) 09/21/2018   Flonase [fluticasone propionate] Other (See Comments) 11/13/2014   Hydrocodone Other (See Comments) 11/27/2008   Macrobid [nitrofurantoin]  01/22/2021   Reglan [metoclopramide] Other (See Comments) 09/23/2018   Topamax [topiramate] Other (See Comments) 09/21/2018   Benadryl [diphenhydramine hcl] Palpitations 10/18/2017   Sulfa antibiotics Anxiety 09/05/2014    Family History  Problem Relation Age of Onset   Breast cancer Mother 18   Diabetes Mother    Hypertension Father    Heart disease Father    Hypertension Sister    Anuerysm Sister        head   Hypertension Sister    Anxiety disorder Sister    Other Sister        prediabetes   Osteoporosis Maternal Grandmother    Diabetes Maternal Grandmother    COPD Paternal Grandmother    Heart disease Paternal Grandfather    Breast cancer Cousin 21       pat first cousin   Breast cancer Cousin        mat second cousin with breast cancer in her 54s   Thyroid cancer Cousin 48       pat first cousin   Prostate cancer Other 54   Colon cancer Neg Hx    Stomach cancer Neg Hx    Esophageal cancer Neg Hx    Pancreatic cancer Neg Hx    Rectal cancer Neg Hx      Physical Exam: General:   Alert,  well-nourished, pleasant and cooperative in NAD Head:  Normocephalic and atraumatic. Eyes:  Sclera clear, no icterus.   Conjunctiva pink. Mouth:  No deformity  or lesions.   Neck:  Supple; no masses or thyromegaly. Lungs:  Clear throughout to auscultation.   No wheezes. Heart:  Regular rate and rhythm; no murmurs. Abdomen:  Soft, non-tender, nondistended, normal bowel sounds, no rebound or guarding.  Msk:  Symmetrical. No boney deformities LAD: No inguinal or umbilical LAD Extremities:  No clubbing or edema. Neurologic:  Alert and  oriented x4;  grossly nonfocal Skin:  No obvious rash or bruise. Psych:  Alert and cooperative. Normal mood and affect.    [place] L. Tarri Glenn, MD, MPH 08/20/2021, 1:56 PM

## 2021-08-20 NOTE — Op Note (Signed)
[facility] Patient Name: Jennifer [name] Procedure Date: 08/20/2021 1:59 PM MRN: [id number] Endoscopist: Thornton Park MD, MD Age: 65 Referring MD:  Date of Birth: February 03, 1956 Gender: Female Account #: [id number] Procedure:                Colonoscopy Indications:              Unexplained iron deficiency anemia not explained by                            EGD Medicines:                Monitored Anesthesia Care Procedure:                Pre-Anesthesia Assessment:                           - Prior to the procedure, a History and Physical                            was performed, and patient medications and                            allergies were reviewed. The patient's tolerance of                            previous anesthesia was also reviewed. The risks                            and benefits of the procedure and the sedation                            options and risks were discussed with the patient.                            All questions were answered, and informed consent                            was obtained. Prior Anticoagulants: The patient has                            taken no previous anticoagulant or antiplatelet                            agents. ASA Grade Assessment: II - A patient with                            mild systemic disease. After reviewing the risks                            and benefits, the patient was deemed in                            satisfactory condition to undergo the procedure.  After obtaining informed consent, the colonoscope                            was passed under direct vision. Throughout the                            procedure, the patient's blood pressure, pulse, and                            oxygen saturations were monitored continuously. The                            CF [id number] #[id number] was introduced through the anus                            and advanced to the 3 cm into the ileum. A second                             forward view of the right colon was performed. The                            colonoscopy was performed without difficulty. The                            patient tolerated the procedure well. The quality                            of the bowel preparation was good. The terminal                            ileum, ileocecal valve, appendiceal orifice, and                            rectum were photographed. Scope In: 2:08:56 PM Scope Out: 2:19:15 PM Scope Withdrawal Time: 0 hours 7 minutes 35 seconds  Total Procedure Duration: 0 hours 10 minutes 19 seconds  Findings:                 The perianal and digital rectal examinations were                            normal.                           Non-bleeding internal hemorrhoids were found.                           A 3 mm polyp was found in the hepatic flexure. The                            polyp was flat. The polyp was removed with a cold                            snare. Resection and retrieval were complete.  Estimated blood loss was minimal.                           The exam was otherwise without abnormality on                            direct and retroflexion views. Complications:            No immediate complications. Estimated blood loss:                            Minimal. Estimated Blood Loss:     Estimated blood loss was minimal. Impression:               - Non-bleeding internal hemorrhoids.                           - One 3 mm polyp at the hepatic flexure, removed                            with a cold snare. Resected and retrieved.                           - The examination was otherwise normal on direct                            and retroflexion views. Recommendation:           - Patient has a contact number available for                            emergencies. The signs and symptoms of potential                            delayed complications were discussed with the                             patient. Return to normal activities tomorrow.                            Written discharge instructions were provided to the                            patient.                           - Resume previous diet.                           - Continue present medications.                           - Await pathology results.                           - Evaluate the small bowel if anemia does not  respond to iron replacement or there is evidence                            for occult GI blood loss.                           - Repeat colonoscopy date to be determined after                            pending pathology results are reviewed for                            surveillance.                           - Emerging evidence supports eating a diet of                            fruits, vegetables, grains, calcium, and yogurt                            while reducing red meat and alcohol may reduce the                            risk of colon cancer. Thornton Park MD, MD 08/20/2021 2:28:38 PM This report has been signed electronically.

## 2021-08-22 ENCOUNTER — Telehealth: Payer: Self-pay | Admitting: Family Medicine

## 2021-08-22 ENCOUNTER — Telehealth: Payer: Self-pay | Admitting: *Deleted

## 2021-08-22 ENCOUNTER — Telehealth: Payer: Self-pay

## 2021-08-22 NOTE — Telephone Encounter (Signed)
  Follow up Call-  Call back number 08/20/2021 07/30/2021  Post procedure Call Back phone  # [phone]  Permission to leave phone message Yes Yes  Some recent data might be hidden     Patient questions:  Do you have a fever, pain , or abdominal swelling? No. Pain Score  0 *  Have you tolerated food without any problems? Yes.    Have you been able to return to your normal activities? Yes.    Do you have any questions about your discharge instructions: Diet   No. Medications  No. Follow up visit  No.  Do you have questions or concerns about your Care? Pt. Still passing some air.  Told pt. To walk, and drink something hot if she feels bloated, and to call if she has any questions or concerns.  Actions: * If pain score is 4 or above: No action needed, pain <4.

## 2021-08-22 NOTE — Telephone Encounter (Signed)
  Follow up Call-  Call back number 08/20/2021 07/30/2021  Post procedure Call Back phone  # [phone]  Permission to leave phone message Yes Yes  Some recent data might be hidden     Patient questions:  Message left to call us if necessary.

## 2021-08-25 ENCOUNTER — Encounter: Payer: Self-pay | Admitting: Family Medicine

## 2021-08-25 ENCOUNTER — Other Ambulatory Visit: Payer: Self-pay | Admitting: Hematology and Oncology

## 2021-08-25 MED ORDER — ALPRAZOLAM 0.5 MG PO TABS: ORAL_TABLET | ORAL | 2 refills | Status: DC

## 2021-08-25 NOTE — Telephone Encounter (Signed)
Last written 08-04-21 #15 Last OV 06-17-21 No Future OV [facility]

## 2021-08-25 NOTE — Telephone Encounter (Addendum)
We sent #[phone] with 1 refill (6 months worth of refills) in on 08/05/21, pt just needs to check with the pharmacy and have them pull refill on file. Called pt but no answer so left VM letting her know this info

## 2021-08-25 NOTE — Telephone Encounter (Signed)
Pt called asking why the medication was denied, that she have to take that on a daily. Please advise. Pt only has 2 left

## 2021-08-26 MED ORDER — PANTOPRAZOLE SODIUM 40 MG PO TBEC: 40.0000 mg | DELAYED_RELEASE_TABLET | Freq: Every day | ORAL | 1 refills | Status: DC

## 2021-09-01 ENCOUNTER — Other Ambulatory Visit: Payer: Self-pay

## 2021-09-01 ENCOUNTER — Ambulatory Visit (HOSPITAL_COMMUNITY)
Admission: RE | Admit: 2021-09-01 | Discharge: 2021-09-01 | Disposition: A | Discharge status: Home / Self Care | Payer: BC Managed Care – PPO | Source: Ambulatory Visit | Attending: Gastroenterology | Admitting: Gastroenterology

## 2021-09-01 DIAGNOSIS — R1310 Dysphagia, unspecified: Secondary | ICD-10-CM

## 2021-09-16 DIAGNOSIS — G43719 Chronic migraine without aura, intractable, without status migrainosus: Secondary | ICD-10-CM | POA: Diagnosis not present

## 2021-09-26 ENCOUNTER — Other Ambulatory Visit: Payer: Self-pay

## 2021-09-26 ENCOUNTER — Ambulatory Visit
Admission: RE | Admit: 2021-09-26 | Discharge: 2021-09-26 | Disposition: A | Discharge status: Home / Self Care | Payer: BC Managed Care – PPO | Source: Ambulatory Visit | Attending: Obstetrics & Gynecology | Admitting: Obstetrics & Gynecology

## 2021-09-26 ENCOUNTER — Encounter: Payer: Self-pay | Admitting: Hematology and Oncology

## 2021-09-26 DIAGNOSIS — Z853 Personal history of malignant neoplasm of breast: Secondary | ICD-10-CM

## 2021-09-26 DIAGNOSIS — R922 Inconclusive mammogram: Secondary | ICD-10-CM | POA: Diagnosis not present

## 2021-10-03 ENCOUNTER — Inpatient Hospital Stay: Payer: BC Managed Care – PPO

## 2021-10-03 ENCOUNTER — Other Ambulatory Visit: Payer: Self-pay | Admitting: *Deleted

## 2021-10-03 DIAGNOSIS — D509 Iron deficiency anemia, unspecified: Secondary | ICD-10-CM

## 2021-10-06 ENCOUNTER — Inpatient Hospital Stay: Payer: BC Managed Care – PPO | Attending: Hematology and Oncology

## 2021-10-06 ENCOUNTER — Other Ambulatory Visit: Payer: Self-pay

## 2021-10-06 DIAGNOSIS — Z170 Estrogen receptor positive status [ER+]: Secondary | ICD-10-CM | POA: Diagnosis not present

## 2021-10-06 DIAGNOSIS — C50211 Malignant neoplasm of upper-inner quadrant of right female breast: Secondary | ICD-10-CM | POA: Diagnosis present

## 2021-10-06 DIAGNOSIS — D509 Iron deficiency anemia, unspecified: Secondary | ICD-10-CM | POA: Diagnosis present

## 2021-10-06 LAB — CBC WITH DIFFERENTIAL (CANCER CENTER ONLY)
Abs Immature Granulocytes: 0.01 10*3/uL (ref 0.00–0.07)
Basophils Absolute: 0 10*3/uL (ref 0.0–0.1)
Basophils Relative: 1 %
Eosinophils Absolute: 0.1 10*3/uL (ref 0.0–0.5)
Eosinophils Relative: 2 %
HCT: 38.7 % (ref 36.0–46.0)
Hemoglobin: 13.1 g/dL (ref 12.0–15.0)
Immature Granulocytes: 0 %
Lymphocytes Relative: 24 %
Lymphs Abs: 1.3 10*3/uL (ref 0.7–4.0)
MCH: 28.9 pg (ref 26.0–34.0)
MCHC: 33.9 g/dL (ref 30.0–36.0)
MCV: 85.2 fL (ref 80.0–100.0)
Monocytes Absolute: 0.4 10*3/uL (ref 0.1–1.0)
Monocytes Relative: 8 %
Neutro Abs: 3.5 10*3/uL (ref 1.7–7.7)
Neutrophils Relative %: 65 %
Platelet Count: 283 10*3/uL (ref 150–400)
RBC: 4.54 MIL/uL (ref 3.87–5.11)
RDW: 16.8 % — ABNORMAL HIGH (ref 11.5–15.5)
WBC Count: 5.4 10*3/uL (ref 4.0–10.5)
nRBC: 0 % (ref 0.0–0.2)

## 2021-10-06 LAB — FERRITIN: Ferritin: 105 ng/mL (ref 11–307)

## 2021-10-06 LAB — IRON AND TIBC
Iron: 67 ug/dL (ref 41–142)
Saturation Ratios: 18 % — ABNORMAL LOW (ref 21–57)
TIBC: 380 ug/dL (ref 236–444)
UIBC: 313 ug/dL (ref 120–384)

## 2021-10-06 NOTE — Progress Notes (Signed)
Patient Care Team: Tower, [name], MD as PCP - General Nicholas Lose, MD as Consulting Physician (Hematology and Oncology) Kyung Rudd, MD as Consulting Physician (Radiation Oncology) Jovita Kussmaul, MD as Consulting Physician (General Surgery)  DIAGNOSIS:    ICD-10-CM   1. Iron deficiency anemia, unspecified iron deficiency anemia type  D50.9 CBC with Differential (Cancer Center Only)    Iron and TIBC    Ferritin    CMP ([place] only)    2. Malignant neoplasm of upper-inner quadrant of right breast in female, estrogen receptor positive ([place])  C50.211    Z17.0       SUMMARY OF ONCOLOGIC HISTORY: Oncology History  Malignant neoplasm of upper-inner quadrant of right breast in female, estrogen receptor positive ([place])  09/13/2018 Initial Diagnosis   Diagnostic mammogram detected right breast mass with distortion LIQ at 2:00 9 cm from nipple 1.7 cm, at 1:00 there was a benign 1.2 cm fibrocystic change, biopsy of the 2:00 mass revealed grade 1 IDC with DCIS ER 100%, PR 90%, Ki-67 10%, HER-2 1+ negative, T1CN0 stage Ia clinical stage   10/10/2018 Surgery   Right lumpectomy: IDC grade 1, 1.2 cm, with intermediate grade DCIS, margins negative, 0/2 lymph nodes negative, T1CN0 stage Ia ER 100%, PR 90%, Ki-67 10%, HER-2 1+ negative    10/10/2018 Oncotype testing   oncotype results of 29: Risk of recurrence without chemo 18%   11/07/2018 - 12/08/2018 Radiation Therapy   1. Right Breast / 42.56 Gy in 16 fractions 2. Right Breast Boost / 8 Gy in 4 fractions - Total dose 50.56 Gy   12/2018 -  Anti-estrogen oral therapy   Anastrozole daily, planned for 7 years   11/11/2019 Genetic Testing   Negative genetic testing on the common hereditary cancer panel.  The Common Hereditary Gene Panel offered by [facility] includes sequencing and/or deletion duplication testing of the following 48 genes: APC, ATM, AXIN2, BARD1, BMPR1A, BRCA1, BRCA2, BRIP1, CDH1, CDK4, CDKN2A (p14ARF), CDKN2A (p16INK4a),  CHEK2, CTNNA1, DICER1, EPCAM (Deletion/duplication testing only), GREM1 (promoter region deletion/duplication testing only), KIT, MEN1, MLH1, MSH2, MSH3, MSH6, MUTYH, NBN, NF1, NHTL1, PALB2, PDGFRA, PMS2, POLD1, POLE, PTEN, RAD50, RAD51C, RAD51D, RNF43, SDHB, SDHC, SDHD, SMAD4, SMARCA4. STK11, TP53, TSC1, TSC2, and VHL.  The following genes were evaluated for sequence changes only: SDHA and HOXB13 c.251G>A variant only. The report date is 11/11/2019     CHIEF COMPLIANT: Follow-up of right breast cancer on letrozole, concern for lymphedema  INTERVAL HISTORY: Jennifer [name] is a 65 y.o. with above-mentioned history of right breast cancer who underwent lumpectomy, radiation, and is currently on anastrozole. Mammogram on 09/26/2021 showed no evidence of malignancy. She presents to the clinic today for follow-up.  She has felt marked improvement since receiving IV iron therapy.  She is no longer short of breath with exertion.  ALLERGIES:  is allergic to ciprofloxacin, codeine phosphate, decongestant [pseudoephedrine], flonase [fluticasone propionate], hydrocodone, macrobid [nitrofurantoin], reglan [metoclopramide], topamax [topiramate], benadryl [diphenhydramine hcl], and sulfa antibiotics.  MEDICATIONS:  Current Outpatient Medications  Medication Sig Dispense Refill   ALPRAZolam (XANAX) 0.5 MG tablet TAKE 1/2 TO 1 TABLET BY MOUTH ONCE DAILY AS NEEDED FOR SLEEP/ANXIETY 15 tablet 2   atenolol (TENORMIN) 25 MG tablet Take 12.5 mg by mouth 3 (three) times daily. For migraine prevention     bisacodyl (DULCOLAX) 5 MG EC tablet Take 5 mg by mouth daily as needed for moderate constipation.     CALCIUM CARBONATE-VITAMIN D PO Take 1 tablet by mouth  daily. 1000 mg     famotidine (PEPCID) 40 MG tablet Take 40 mg by mouth 2 (two) times daily.      hydrocortisone 2.5 % cream Apply to affected areas BID PRN as directed. 30 g 3   hydrOXYzine (ATARAX/VISTARIL) 25 MG tablet Take 1 tablet (25 mg total) by mouth 3 (three)  times daily as needed. 30 tablet 0   ketorolac (TORADOL) 10 MG tablet Take 1 tablet (10 mg total) by mouth every 6 (six) hours as needed for moderate pain. 12 tablet 0   letrozole (FEMARA) 2.5 MG tablet TAKE 1 TABLET BY MOUTH EVERY DAY 90 tablet 3   levocetirizine (XYZAL) 5 MG tablet Take 5 mg by mouth at bedtime.   3   levothyroxine (SYNTHROID) 75 MCG tablet TAKE ONE TABLET BY MOUTH ONE TIME DAILY BEFORE BREAKFAST 90 tablet 1   Magnesium Oxide 400 (240 Mg) MG TABS Take 400 mg by mouth daily.      metFORMIN (GLUCOPHAGE XR) 500 MG 24 hr tablet Take 1 tablet (500 mg total) by mouth daily with breakfast. 90 tablet 3   Multiple Vitamin (MULTIVITAMIN WITH MINERALS) TABS tablet Take 1 tablet by mouth daily.     Omega-3 Fatty Acids (FISH OIL) 1000 MG CAPS Take 1,000 mg by mouth daily.      OnabotulinumtoxinA (BOTOX IJ) Inject 1 Dose as directed every 6 (six) weeks.      ondansetron (ZOFRAN ODT) 4 MG disintegrating tablet Take 1 tablet (4 mg total) by mouth every 8 (eight) hours as needed. 20 tablet 0   pantoprazole (PROTONIX) 40 MG tablet Take 1 tablet (40 mg total) by mouth daily. 90 tablet 1   predniSONE (DELTASONE) 20 MG tablet Take 20 mg by mouth as needed (Migraines).     PRESCRIPTION MEDICATION Inject into the skin every 6 (six) weeks. Steroid Trigger     Probiotic Product (PROBIOTIC-10 PO) Take 1 capsule by mouth daily.      promethazine (PHENERGAN) 25 MG tablet Take 12.5 mg by mouth daily as needed for nausea or vomiting. When having a migraine     rizatriptan (MAXALT) 10 MG tablet Take 1 tablet (10 mg total) by mouth once as needed for up to 10 days for migraine. May repeat in 2 hours if needed 10 tablet 0   triamcinolone (KENALOG) 0.1 % Apply to affected areas BID PRN for up to two weeks at a time as directed. 45 g 3   TURMERIC PO Take 1 tablet by mouth daily.  (Patient not taking: Reported on 08/20/2021)     Current Facility-Administered Medications  Medication Dose Route Frequency Provider  Last Rate Last Admin   0.9 %  sodium chloride infusion  500 mL Intravenous Once [place], MD       0.9 %  sodium chloride infusion  500 mL Intravenous Once [place] Park, MD        PHYSICAL EXAMINATION: ECOG PERFORMANCE STATUS: 1 - Symptomatic but completely ambulatory  Vitals:   10/07/21 1420  BP: (!) 142/66  Pulse: 82  Resp: 17  Temp: 97.7 F (36.5 C)  SpO2: 99%   Filed Weights   10/07/21 1420  Weight: 132 lb 6.4 oz (60.1 kg)    BREAST: No palpable masses or nodules in either right or left breasts. No palpable axillary supraclavicular or infraclavicular adenopathy no breast tenderness or nipple discharge. (exam performed in the presence of a chaperone)  LABORATORY DATA:  I have reviewed the data as listed CMP Latest Ref  Rng & Units 06/13/2021 10/28/2020 10/08/2020  Glucose 70 - 99 mg/dL 98 - 158(H)  BUN 6 - 23 mg/dL 14 - 13  Creatinine 0.40 - 1.20 mg/dL 0.74 - 0.70  Sodium 135 - 145 mEq/L 137 - 132(L)  Potassium 3.5 - 5.1 mEq/L 4.7 - 4.2  Chloride 96 - 112 mEq/L 101 - 94(L)  CO2 19 - 32 mEq/L 29 - 28  Calcium 8.4 - 10.5 mg/dL 9.4 - 9.3  Total Protein 6.0 - 8.3 g/dL 6.7 7.5 7.9  Total Bilirubin 0.2 - 1.2 mg/dL 0.4 0.4 0.6  Alkaline Phos 39 - 117 U/L 116 123(H) 104  AST 0 - 37 U/L _0 ALT 0 - 35 U/L _1 Lab Results  Component Value Date   WBC 5.4 10/06/2021   HGB 13.1 10/06/2021   HCT 38.7 10/06/2021   MCV 85.2 10/06/2021   PLT 283 10/06/2021   NEUTROABS 3.5 10/06/2021    ASSESSMENT & PLAN:  Malignant neoplasm of upper-inner quadrant of right breast in female, estrogen receptor positive ([place]) 10/10/2018: Right lumpectomy: IDC grade 1, 1.2 cm, with intermediate grade DCIS, margins negative, 0/2 lymph nodes negative, T1CN0 stage Ia ER 100%, PR 90%, Ki-67 10%, HER-2 1+ negative Oncotype DX: 29: 18% risk of recurrence   Treatment plan: 1.   I recommended systemic adjuvant chemotherapy with Taxotere and Cytoxan every 3 weeks x4 cycles  (patient refused) 2. Adjuvant radiation therapy 11/08/2018-12/05/2018 3. Adjuvant antiestrogen therapy with anastrozole started 12/05/2018 switched to letrozole 10/09/19   Letrozole toxicities:myalgias    Iron deficiency anemia Etiology: Blood loss versus malabsorption August 2022: 3 doses of Venofer Follows with gastroenterology and has had endoscopies and colonoscopies. Lab review: TIBC 380, iron saturation 18%, ferritin 105, hemoglobin 13.1, MCV 85.2 I discussed with the patient that she does not need any IV iron at this time.  Recheck labs in 3 months and follow-up after that to discuss results.    Orders Placed This Encounter  Procedures   CBC with Differential ([place] Only)    Standing Status:   Future    Standing Expiration Date:   10/07/2022   Iron and TIBC    Standing Status:   Future    Standing Expiration Date:   10/07/2022   Ferritin    Standing Status:   Future    Standing Expiration Date:   10/07/2022   CMP ([place] only)    Standing Status:   Future    Standing Expiration Date:   10/07/2022    The patient has a good understanding of the overall plan. she agrees with it. she will call with any problems that may develop before the next visit here.  Total time spent: 20 mins including face to face time and time spent for planning, charting and coordination of care  Rulon Eisenmenger, MD, MPH 10/07/2021  I, Thana Ates, am acting as scribe for Dr. Nicholas Lose.  I have reviewed the above documentation for accuracy and completeness, and I agree with the above.

## 2021-10-07 ENCOUNTER — Encounter: Payer: Self-pay | Admitting: Family Medicine

## 2021-10-07 ENCOUNTER — Inpatient Hospital Stay (HOSPITAL_BASED_OUTPATIENT_CLINIC_OR_DEPARTMENT_OTHER): Payer: BC Managed Care – PPO | Admitting: Hematology and Oncology

## 2021-10-07 VITALS — BP 142/66 | HR 82 | Temp 97.7°F | Resp 17 | Ht 60.0 in | Wt 132.4 lb

## 2021-10-07 DIAGNOSIS — Z170 Estrogen receptor positive status [ER+]: Secondary | ICD-10-CM | POA: Diagnosis not present

## 2021-10-07 DIAGNOSIS — D509 Iron deficiency anemia, unspecified: Secondary | ICD-10-CM | POA: Diagnosis not present

## 2021-10-07 DIAGNOSIS — C50211 Malignant neoplasm of upper-inner quadrant of right female breast: Secondary | ICD-10-CM | POA: Diagnosis not present

## 2021-10-07 NOTE — Assessment & Plan Note (Signed)
10/10/2018:Right lumpectomy: IDC grade 1, 1.2 cm, with intermediate grade DCIS, margins negative, 0/2 lymph nodes negative, T1CN0 stage Ia ER 100%, PR 90%, Ki-67 10%, HER-2 1+ negative Oncotype DX: 29: 18% risk of recurrence  Treatment plan: 1.I recommended systemic adjuvant chemotherapy withTaxotere and Cytoxan every 3 weeks x4 cycles(patient refused) 2. Adjuvant radiation therapy12/17/2019-12/05/2018 3. Adjuvant antiestrogen therapywithanastrozole started 1/13/2020switched to letrozole 10/09/19  Letrozoletoxicities:myalgias  Breast cancer surveillance: 1.  Breast exam 10/07/2021: Benign 2. mammogram 09/26/2021: Benign breast density category D Based on high degree of breast density, I discussed with her about the role of breast MRI.  Extensive work-up for abdominal pain including CT abdomen pelvis and chest as well as MRCP: All these tests were normal. Follows with gastroenterology.

## 2021-10-07 NOTE — Assessment & Plan Note (Addendum)
Etiology: Blood loss versus malabsorption August 2022: 3 doses of Venofer Follows with gastroenterology and has had endoscopies and colonoscopies. Lab review: TIBC 380, iron saturation 18%, ferritin 105, hemoglobin 13.1, MCV 85.2 I discussed with the patient that she does not need any IV iron at this time.  Recheck labs in 3 months and follow-up after that to discuss results.

## 2021-10-08 ENCOUNTER — Other Ambulatory Visit: Payer: Self-pay

## 2021-10-08 ENCOUNTER — Other Ambulatory Visit: Payer: Self-pay | Admitting: Obstetrics & Gynecology

## 2021-10-08 ENCOUNTER — Ambulatory Visit (INDEPENDENT_AMBULATORY_CARE_PROVIDER_SITE_OTHER): Payer: BC Managed Care – PPO

## 2021-10-08 DIAGNOSIS — G43019 Migraine without aura, intractable, without status migrainosus: Secondary | ICD-10-CM | POA: Diagnosis not present

## 2021-10-08 DIAGNOSIS — Z780 Asymptomatic menopausal state: Secondary | ICD-10-CM

## 2021-10-08 DIAGNOSIS — M542 Cervicalgia: Secondary | ICD-10-CM | POA: Diagnosis not present

## 2021-10-08 DIAGNOSIS — M8589 Other specified disorders of bone density and structure, multiple sites: Secondary | ICD-10-CM

## 2021-10-08 DIAGNOSIS — M8580 Other specified disorders of bone density and structure, unspecified site: Secondary | ICD-10-CM

## 2021-10-08 DIAGNOSIS — G43719 Chronic migraine without aura, intractable, without status migrainosus: Secondary | ICD-10-CM | POA: Diagnosis not present

## 2021-10-10 ENCOUNTER — Ambulatory Visit: Payer: BC Managed Care – PPO | Admitting: Gastroenterology

## 2021-10-10 ENCOUNTER — Telehealth: Payer: Self-pay | Admitting: Gastroenterology

## 2021-10-10 DIAGNOSIS — Z20822 Contact with and (suspected) exposure to covid-19: Secondary | ICD-10-CM | POA: Diagnosis not present

## 2021-10-10 NOTE — Telephone Encounter (Signed)
Pt showed up for her appt at 9:50am with Dr. Tarri Glenn. She stated that she missed the office call to r/s her appt and did not hear her voice mails. Pt was very frustrated, I offered pt to r/s to 11/28 with APP but she declined and insisted to see Dr. Tarri Glenn only before her first available appt on 11/07/21. Please call her.

## 2021-10-10 NOTE — Telephone Encounter (Signed)
Reviewed Dr. Tarri Glenn availability. Nothing available until 11/07/21 (including urgent spots). Pt may schedule sooner with APP as there are numerous availabilities PRIOR to 11/07/21. LVM requesting returned call.

## 2021-10-15 ENCOUNTER — Other Ambulatory Visit: Payer: Self-pay

## 2021-10-15 ENCOUNTER — Encounter: Payer: Self-pay | Admitting: Family Medicine

## 2021-10-15 ENCOUNTER — Ambulatory Visit (INDEPENDENT_AMBULATORY_CARE_PROVIDER_SITE_OTHER): Payer: BC Managed Care – PPO | Admitting: Family Medicine

## 2021-10-15 ENCOUNTER — Telehealth: Payer: BC Managed Care – PPO | Admitting: Family Medicine

## 2021-10-15 VITALS — BP 112/68 | HR 60 | Temp 97.9°F | Ht 60.0 in | Wt 131.1 lb

## 2021-10-15 DIAGNOSIS — M79604 Pain in right leg: Secondary | ICD-10-CM

## 2021-10-15 DIAGNOSIS — G43109 Migraine with aura, not intractable, without status migrainosus: Secondary | ICD-10-CM

## 2021-10-15 MED ORDER — RIZATRIPTAN BENZOATE 10 MG PO TABS: 10.0000 mg | ORAL_TABLET | Freq: Once | ORAL | 0 refills | Status: DC | PRN

## 2021-10-15 MED ORDER — PREDNISONE 10 MG PO TABS: ORAL_TABLET | ORAL | 0 refills | Status: DC

## 2021-10-15 NOTE — Patient Instructions (Addendum)
Take the prednisone taper 40 mg as directed  Try and avoid things that make the headache worse  Try ice on head/scalp  If no improvement or if any vision change let us know    If you leg pain worsens or changes or if you get swelling, redness or a lump let us know   We will set up an ultrasound  You will get a call to set this up

## 2021-10-15 NOTE — Assessment & Plan Note (Signed)
R sided migraine with some scalp tenderness No temporal tenderness or rash on exam  tx with prednisone taper 40 mg  Update if not starting to improve in a week or if worsening

## 2021-10-15 NOTE — Assessment & Plan Note (Signed)
Mild pain and tenderness for about a month  No swelling or erythema  Very reassuring  Pt takes letrozole - a risk factor for clotting  H/o spider veins  May me msk in nature For reassurance, ref done for vasc/venous doppler Pending report  If symptoms worsen or become severe inst to alert [place] and go to ER

## 2021-10-15 NOTE — Progress Notes (Signed)
Subjective:    Patient ID: Jennifer [name], female    DOB: 1956-11-09, 65 y.o.   MRN: [id number]  This visit occurred during the SARS-CoV-2 public health emergency.  Safety protocols were in place, including screening questions prior to the visit, additional usage of staff PPE, and extensive cleaning of exam room while observing appropriate contact time as indicated for disinfecting solutions.   HPI Pt presents for c/o R leg pain  Also headache  Wt Readings from Last 3 Encounters:  10/15/21 131 lb 2 oz (59.5 kg)  10/07/21 132 lb 6.4 oz (60.1 kg)  08/20/21 138 lb (62.6 kg)   25.61 kg/m  Has a little dull pain in her R mid calf  Fleeting, on and off for about a month Sometimes goes lower  Had a little cramping in top of her feet last week  That is better  No pain to flex foot or walk on it  No trauma or rash    Takes letrozole -always worried about the potential for blood clots   She was exp to covid 9 d ago Multiple covid tests have been negative at home  Had a little twinge in her throat and that went away Did get her flu shot  R side of her head hurts- neck tension gives her a migraine on that side  Requests steroid px   Patient Active Problem List   Diagnosis Date Noted   Right leg pain 10/15/2021   Microcytic anemia 07/07/2021   Right wrist pain 04/18/2021   De Quervain's tenosynovitis, right 04/18/2021   Iron deficiency anemia 09/08/2020   Common bile duct dilatation 09/06/2020   Opacity of lung on imaging study 09/06/2020   Weakness of right foot 07/10/2020   Dizziness 07/10/2020   History of breast cancer 03/01/2020   Genetic testing 11/14/2019   Bilateral hand pain 11/03/2019   Family history of prostate cancer    Adnexal cyst 09/26/2019   Use of anastrozole (Arimidex) 03/21/2019   Family history of breast cancer    Stress reaction 09/26/2018   Malignant neoplasm of upper-inner quadrant of right breast in female, estrogen receptor positive ([place])  09/20/2018   B12 deficiency 04/13/2018   Joint pain 04/11/2018   Colon cancer screening 09/16/2017   Prediabetes 07/28/2016   Allergic rhinitis 11/13/2014   Screening for lipoid disorders 09/26/2014   Preventative health care 09/26/2014   Dysuria 08/07/2014   Routine general medical examination at a health care facility 10/28/2012   Herpes labialis 04/28/2011   GERD 09/16/2010   Hypothyroidism 05/07/2008   Osteopenia 05/07/2008   Migraine with aura 11/11/2007   Past Medical History:  Diagnosis Date   Allergic rhinitis    Anxiety    Arthritis    hands, knees   Breast cancer ([place]) 2019   Right Breast Cancer   Cancer ([place]) 09/2018   Breast CA new DX, right breast   Cervical dysplasia    Common bile duct dilation    Diabetes mellitus without complication (HCC)    Endometriosis    Esophageal reflux    Family history of adverse reaction to anesthesia    younger sister anxiety for a dew days after   Family history of breast cancer    Family history of breast cancer    Family history of prostate cancer    Hepatic cyst 08/2020   multiple   Hepatic hemangioma 08/2020   intrahepatic hemangioma   Hiatal hernia    Hypertension    Hypothyroid  Migraine with aura, without mention of intractable migraine without mention of status migrainosus    Osteopenia 08/2019   T score -2.2 FRAX 11% / 1.7%   Personal history of radiation therapy 2019   Right Breast Cancer   Pre-diabetes    Past Surgical History:  Procedure Laterality Date   BIOPSY  09/12/2020   Procedure: BIOPSY;  Surgeon: [name], MD;  Location: [facility];  Service: Endoscopy;;   BREAST EXCISIONAL BIOPSY Bilateral over 10 years ago   benign x 2    BREAST LUMPECTOMY Right 10/10/2018   BREAST LUMPECTOMY WITH RADIOACTIVE SEED AND SENTINEL LYMPH NODE BIOPSY Right 10/10/2018   Procedure: RIGHT BREAST LUMPECTOMY WITH RADIOACTIVE SEED AND RIGHT SENTINEL LYMPH NODE BIOPSY;  Surgeon: [name], MD;  Location:  [facility];  Service: General;  Laterality: Right;   BREAST SURGERY     Benign breast lump excised   CERVICAL CONE BIOPSY  1985   severe dysplasia   COLONOSCOPY  2005 and dec 2019   normal   COLPOSCOPY     endoscopy  10/2018   ESOPHAGOGASTRODUODENOSCOPY (EGD) WITH PROPOFOL N/A 09/12/2020   Procedure: ESOPHAGOGASTRODUODENOSCOPY (EGD) WITH PROPOFOL;  Surgeon: [name], MD;  Location: [facility];  Service: Endoscopy;  Laterality: N/A;   EUS N/A 09/12/2020   Procedure: UPPER ENDOSCOPIC ULTRASOUND (EUS) RADIAL;  Surgeon: [name], MD;  Location: [facility];  Service: Endoscopy;  Laterality: N/A;   LAPAROSCOPIC ASSISTED VAGINAL HYSTERECTOMY     LAPAROSCOPIC BILATERAL SALPINGO OOPHERECTOMY Bilateral 10/27/2019   Procedure: LAPAROSCOPIC BILATERAL SALPINGO OOPHORECTOMY WITH PERITONEAL WASHINGS;  Surgeon: Princess Bruins, MD;  Location: [facility];  Service: Gynecology;  Laterality: Bilateral;  request 1:00pm on Friday, Dec. 4th in [place] time held requests one hour OR time   moles removed from upper back, face lft, 1 between breasts, upper leg left inside  10/18/2019   wearing small round bandaids on for 2 weeks   ROTATOR CUFF REPAIR Bilateral 08/2008   UPPER GASTROINTESTINAL ENDOSCOPY     Social History   Tobacco Use   Smoking status: Never   Smokeless tobacco: Never  Vaping Use   Vaping Use: Never used  Substance Use Topics   Alcohol use: No    Alcohol/week: 0.0 standard drinks   Drug use: No   Family History  Problem Relation Age of Onset   Breast cancer Mother 87   Diabetes Mother    Hypertension Father    Heart disease Father    Hypertension Sister    Anuerysm Sister        head   Hypertension Sister    Anxiety disorder Sister    Other Sister        prediabetes   Osteoporosis Maternal Grandmother    Diabetes Maternal Grandmother    COPD Paternal Grandmother    Heart disease Paternal Grandfather     Breast cancer Cousin 13       pat first cousin   Breast cancer Cousin        mat second cousin with breast cancer in her 40s   Thyroid cancer Cousin 45       pat first cousin   Prostate cancer Other 50   Colon cancer Neg Hx    Stomach cancer Neg Hx    Esophageal cancer Neg Hx    Pancreatic cancer Neg Hx    Rectal cancer Neg Hx    Allergies  Allergen Reactions   Ciprofloxacin Other (  See Comments)    Dizziness    Codeine Phosphate Other (See Comments)    Dizziness, heart palpitations, nervous   Decongestant [Pseudoephedrine] Other (See Comments)    dizziness   Flonase [Fluticasone Propionate] Other (See Comments)    Worsens her migraine    Hydrocodone Other (See Comments)    Made nervous--10/09 surgery rotator cuff   Macrobid [Nitrofurantoin]     headaches   Reglan [Metoclopramide] Other (See Comments)    Dizziness   Topamax [Topiramate] Other (See Comments)    Dizziness    Benadryl [Diphenhydramine Hcl] Palpitations   Sulfa Antibiotics Anxiety   Current Outpatient Medications on File Prior to Visit  Medication Sig Dispense Refill   ALPRAZolam (XANAX) 0.5 MG tablet TAKE 1/2 TO 1 TABLET BY MOUTH ONCE DAILY AS NEEDED FOR SLEEP/ANXIETY 15 tablet 2   atenolol (TENORMIN) 25 MG tablet Take 12.5 mg by mouth 3 (three) times daily. For migraine prevention     CALCIUM CARBONATE-VITAMIN D PO Take 1 tablet by mouth daily. 1000 mg     famotidine (PEPCID) 40 MG tablet Take 40 mg by mouth 2 (two) times daily.      hydrocortisone 2.5 % cream Apply to affected areas BID PRN as directed. 30 g 3   hydrOXYzine (ATARAX/VISTARIL) 25 MG tablet Take 1 tablet (25 mg total) by mouth 3 (three) times daily as needed. 30 tablet 0   ketorolac (TORADOL) 10 MG tablet Take 1 tablet (10 mg total) by mouth every 6 (six) hours as needed for moderate pain. 12 tablet 0   letrozole (FEMARA) 2.5 MG tablet TAKE 1 TABLET BY MOUTH EVERY DAY 90 tablet 3   levocetirizine (XYZAL) 5 MG tablet Take 5 mg by mouth at  bedtime.   3   levothyroxine (SYNTHROID) 75 MCG tablet TAKE ONE TABLET BY MOUTH ONE TIME DAILY BEFORE BREAKFAST 90 tablet 1   Magnesium Oxide 400 (240 Mg) MG TABS Take 400 mg by mouth daily.      metFORMIN (GLUCOPHAGE XR) 500 MG 24 hr tablet Take 1 tablet (500 mg total) by mouth daily with breakfast. 90 tablet 3   Multiple Vitamin (MULTIVITAMIN WITH MINERALS) TABS tablet Take 1 tablet by mouth daily.     Omega-3 Fatty Acids (FISH OIL) 1000 MG CAPS Take 1,000 mg by mouth daily.      OnabotulinumtoxinA (BOTOX IJ) Inject 1 Dose as directed every 6 (six) weeks.      ondansetron (ZOFRAN ODT) 4 MG disintegrating tablet Take 1 tablet (4 mg total) by mouth every 8 (eight) hours as needed. 20 tablet 0   pantoprazole (PROTONIX) 40 MG tablet Take 1 tablet (40 mg total) by mouth daily. 90 tablet 1   PRESCRIPTION MEDICATION Inject into the skin every 6 (six) weeks. Steroid Trigger     Probiotic Product (PROBIOTIC-10 PO) Take 1 capsule by mouth daily.      promethazine (PHENERGAN) 25 MG tablet Take 12.5 mg by mouth daily as needed for nausea or vomiting. When having a migraine     triamcinolone (KENALOG) 0.1 % Apply to affected areas BID PRN for up to two weeks at a time as directed. 45 g 3   TURMERIC PO Take 1 tablet by mouth daily.     No current facility-administered medications on file prior to visit.      Review of Systems  Constitutional:  Positive for fatigue. Negative for activity change, appetite change, fever and unexpected weight change.  HENT:  Negative for congestion, ear pain, rhinorrhea, sinus  pressure and sore throat.   Eyes:  Negative for pain, redness and visual disturbance.  Respiratory:  Negative for cough, shortness of breath and wheezing.   Cardiovascular:  Negative for chest pain and palpitations.  Gastrointestinal:  Negative for abdominal pain, blood in stool, constipation and diarrhea.  Endocrine: Negative for polydipsia and polyuria.  Genitourinary:  Negative for dysuria,  frequency and urgency.  Musculoskeletal:  Positive for myalgias. Negative for arthralgias, back pain and joint swelling.  Skin:  Negative for pallor and rash.  Allergic/Immunologic: Negative for environmental allergies.  Neurological:  Positive for headaches. Negative for dizziness, syncope, facial asymmetry, speech difficulty, weakness, light-headedness and numbness.  Hematological:  Negative for adenopathy. Does not bruise/bleed easily.  Psychiatric/Behavioral:  Negative for decreased concentration and dysphoric mood. The patient is not nervous/anxious.       Objective:   Physical Exam Constitutional:      General: She is not in acute distress.    Appearance: She is well-developed and normal weight. She is not ill-appearing or diaphoretic.  HENT:     Head: Normocephalic and atraumatic.     Comments: No sinus or temporal tenderness    Right Ear: Tympanic membrane, ear canal and external ear normal.     Left Ear: Tympanic membrane, ear canal and external ear normal.     Nose: Nose normal.     Mouth/Throat:     Mouth: Mucous membranes are moist.  Eyes:     General:        Right eye: No discharge.        Left eye: No discharge.     Extraocular Movements: Extraocular movements intact.     Conjunctiva/sclera: Conjunctivae normal.     Pupils: Pupils are equal, round, and reactive to light.  Neck:     Vascular: No carotid bruit.  Cardiovascular:     Rate and Rhythm: Normal rate and regular rhythm.  Pulmonary:     Effort: Pulmonary effort is normal. No respiratory distress.     Breath sounds: Normal breath sounds. No wheezing or rales.  Musculoskeletal:     Cervical back: Normal range of motion and neck supple. No tenderness.     Right lower leg: No edema.     Left lower leg: No edema.     Comments: No LE edema  Slight mid calf tenderness of RLE   No rash, skin change, warmth or palp cord Neg homan's sign   Nl perfusion Nl gait  Lymphadenopathy:     Cervical: No cervical  adenopathy.  Skin:    General: Skin is warm and dry.     Coloration: Skin is not pale.     Findings: No bruising, erythema or rash.  Neurological:     Mental Status: She is alert.     Cranial Nerves: No cranial nerve deficit.     Motor: No weakness.     Coordination: Coordination normal.     Gait: Gait normal.     Deep Tendon Reflexes: Reflexes normal.  Psychiatric:        Mood and Affect: Mood normal.          Assessment & Plan:   Problem List Items Addressed This Visit       Cardiovascular and Mediastinum   Migraine with aura    R sided migraine with some scalp tenderness No temporal tenderness or rash on exam  tx with prednisone taper 40 mg  Update if not starting to improve in a week or if worsening  Relevant Medications   rizatriptan (MAXALT) 10 MG tablet     Other   Right leg pain - Primary    Mild pain and tenderness for about a month  No swelling or erythema  Very reassuring  Pt takes letrozole - a risk factor for clotting  H/o spider veins  May me msk in nature For reassurance, ref done for vasc/venous doppler Pending report  If symptoms worsen or become severe inst to alert [place] and go to ER      Relevant Orders   VAS [place] LOWER EXTREMITY VENOUS (DVT)

## 2021-10-27 ENCOUNTER — Telehealth: Payer: Self-pay | Admitting: Family Medicine

## 2021-10-27 MED ORDER — PREDNISONE 10 MG PO TABS: ORAL_TABLET | ORAL | 0 refills | Status: DC

## 2021-10-27 NOTE — Addendum Note (Signed)
Addended by: Tammi Sou on: 10/27/2021 11:14 AM   Modules accepted: Orders

## 2021-10-27 NOTE — Telephone Encounter (Signed)
Prednisone taper pended to send to pharmacy of choice   Take it all as directed Please alert [place] if worse or not improved (ER if suddenly severe)

## 2021-10-27 NOTE — Telephone Encounter (Signed)
See phone note and access nurse note.

## 2021-10-27 NOTE — Telephone Encounter (Signed)
Pt would like a call back to discuss possible starting the predniSONE taper again for her migraines  She started and stopped the taper over the last 2wks

## 2021-10-27 NOTE — Telephone Encounter (Signed)
PLEASE NOTE: All timestamps contained within this report are represented as [place] Standard Time. CONFIDENTIALTY NOTICE: This fax transmission is intended only for the addressee. It contains information that is legally privileged, confidential or otherwise protected from use or disclosure. If you are not the intended recipient, you are strictly prohibited from reviewing, disclosing, copying using or disseminating any of this information or taking any action in reliance on or regarding this information. If you have received this fax in error, please notify us immediately by telephone so that we can arrange for its return to [place]. Phone: [phone], Toll-Free: [phone], Fax: [phone] Page: 1 of 2 Call Id: [id number] [facility] Night - Client TELEPHONE ADVICE RECORD AccessNurse Patient Name: Jennifer [name] D Gender: Female DOB: 19-Jan-1956 Age: 65 Y 24 D Return Phone Number: [phone] (Primary) Address: [place]/ State/ Zip: [place] [address] Client [facility] Night - Client Client Site [place] [facility] [place], Roque Lias - MD Contact Type Call Who Is Calling Patient / Member / Family / Caregiver Call Type Triage / Clinical Relationship To Patient Self Return Phone Number [phone] (Primary) Chief Complaint Paging or Request for Consult Reason for Call Symptomatic / Request for [place] states she has had a migraine for five days now and has had to treat five days in a row. She may have to go to the ED. She saw Dr. Glori Bickers on the 23rd and she was given some prednisone to take. She didn't take the full dose. Consequently, the migraines came back. She does not have the full amount of medication to take the prednisone protocol and is wondering if she can get a new prescription sent over so she can start that regimen. Pt pharmacy is [facility] in  [place]. Pt is going to start taking the Prednisone she has on hand today. She does not have enough to do the entire regimen and only has enough for today and partially tomorrow. Translation No Nurse Assessment Nurse: Roselie Awkward, RN, Heather Date/Time (Eastern Time): 10/25/2021 12:24:41 PM Confirm and document reason for call. If symptomatic, describe symptoms. ---Caller states she has been having a migraine. Was seen in office on 11/23. Was prescribed prednisone course. Patient took 4 days worth then stopped. Migraine went away but has now come back for the past 5 days. Took remaining dose of prednisone today. Headache seems like her typical migraine symptoms. Does the patient have any new or worsening symptoms? ---Yes Will a triage be completed? ---Yes Related visit to physician within the last 2 weeks? ---Yes Does the PT have any chronic conditions? (i.e. diabetes, asthma, this includes High risk factors for pregnancy, etc.) ---Yes List chronic conditions. ---migraines, pre diabetes, hypothyroid, GERD, Is this a behavioral health or substance abuse call? ---No PLEASE NOTE: All timestamps contained within this report are represented as [place] Standard Time. CONFIDENTIALTY NOTICE: This fax transmission is intended only for the addressee. It contains information that is legally privileged, confidential or otherwise protected from use or disclosure. If you are not the intended recipient, you are strictly prohibited from reviewing, disclosing, copying using or disseminating any of this information or taking any action in reliance on or regarding this information. If you have received this fax in error, please notify us immediately by telephone so that we can arrange for its return to [place]. Phone: [phone], Toll-Free: [phone], Fax: [phone] Page: 2 of 2 Call Id: [id number] Guidelines Guideline Title Affirmed  Question Affirmed Notes Nurse Date/Time Eilene Ghazi Time) Headache [1]  New headache AND [2] age > 8 Amadeo Garnet 10/25/2021 12:30:00 PM Disp. Time Eilene Ghazi Time) Disposition Final User 10/25/2021 12:04:56 PM Attempt made - message left Amadeo Garnet 10/25/2021 12:36:38 PM See PCP within 24 Hours Yes Roselie Awkward, RN, Nira Conn Caller Disagree/Comply Comply Caller Understands Yes PreDisposition Call Doctor Care Advice Given Per Guideline SEE PCP WITHIN 24 HOURS: * IF OFFICE WILL BE OPEN: You need to be examined within the next 24 hours. Call your doctor (or NP/PA) when the office opens and make an appointment. PAIN MEDICINES: * ACETAMINOPHEN - REGULAR STRENGTH TYLENOL: Take 650 mg (two 325 mg pills) by mouth every 4 to 6 hours as needed. Each Regular Strength Tylenol pill has 325 mg of acetaminophen. The most you should take is 10 pills a day (3,250 mg total). Note: In [place], the maximum is 12 pills a day (3,900 mg total). * ACETAMINOPHEN - EXTRA STRENGTH TYLENOL: Take 1,000 mg (two 500 mg pills) every 6 to 8 hours as needed. Each Extra Strength Tylenol pill has 500 mg of acetaminophen. The most you should take is 6 pills a day (3,000 mg total). Note: In [place], the maximum is 8 pills a day (4,000 mg total). * IBUPROFEN (E.G., MOTRIN, ADVIL): Take 400 mg (two 200 mg pills) by mouth every 6 hours. The most you should take is 6 pills a day (1,200 mg total). * NAPROXEN (E.G., ALEVE): Take 220 mg (one 220 mg pill) by mouth every 8 to 12 hours as needed. You may take 440 mg (two 220 mg pills) for your first dose. The most you should take is 3 pills a day (660 mg total). Note: In [place], the maximum is 2 pills a day (one every 12 hours; 440 mg total). REST: * Lie down in a dark quiet place and relax until feeling better. CALL BACK IF: * Blurred vision or double vision occurs * Numbness or weakness of the face, arm or leg * Difficulty with speaking * You become worse CARE ADVICE given per Headache (Adult) guideline. Comments User: Mauri Pole, RN Date/Time  Eilene Ghazi Time): 10/25/2021 12:29:00 PM also takes tryptan for migraines User: Mauri Pole, RN Date/Time Eilene Ghazi Time): 10/25/2021 12:31:55 PM 2/10 pain currently Referrals GO TO FACILITY UNDECIDED

## 2021-10-27 NOTE — Telephone Encounter (Signed)
Left VM requesting pt to call the office back 

## 2021-10-27 NOTE — Telephone Encounter (Signed)
Pt notified of Dr. Tower's comments and Rx sent to pharmacy  

## 2021-10-27 NOTE — Addendum Note (Signed)
Addended by: Loura Pardon A on: 10/27/2021 10:03 AM   Modules accepted: Orders

## 2021-10-29 ENCOUNTER — Encounter: Payer: Self-pay | Admitting: Hematology and Oncology

## 2021-10-29 ENCOUNTER — Ambulatory Visit: Payer: Medicare HMO | Admitting: Dermatology

## 2021-10-29 ENCOUNTER — Other Ambulatory Visit: Payer: Self-pay

## 2021-10-29 DIAGNOSIS — L719 Rosacea, unspecified: Secondary | ICD-10-CM | POA: Diagnosis not present

## 2021-10-29 DIAGNOSIS — D2262 Melanocytic nevi of left upper limb, including shoulder: Secondary | ICD-10-CM

## 2021-10-29 DIAGNOSIS — D1801 Hemangioma of skin and subcutaneous tissue: Secondary | ICD-10-CM | POA: Diagnosis not present

## 2021-10-29 DIAGNOSIS — L814 Other melanin hyperpigmentation: Secondary | ICD-10-CM

## 2021-10-29 DIAGNOSIS — L578 Other skin changes due to chronic exposure to nonionizing radiation: Secondary | ICD-10-CM | POA: Diagnosis not present

## 2021-10-29 DIAGNOSIS — L659 Nonscarring hair loss, unspecified: Secondary | ICD-10-CM

## 2021-10-29 DIAGNOSIS — L821 Other seborrheic keratosis: Secondary | ICD-10-CM

## 2021-10-29 DIAGNOSIS — Z1283 Encounter for screening for malignant neoplasm of skin: Secondary | ICD-10-CM

## 2021-10-29 DIAGNOSIS — D224 Melanocytic nevi of scalp and neck: Secondary | ICD-10-CM | POA: Diagnosis not present

## 2021-10-29 DIAGNOSIS — D485 Neoplasm of uncertain behavior of skin: Secondary | ICD-10-CM

## 2021-10-29 MED ORDER — BIMATOPROST 0.03 % EX SOLN: CUTANEOUS | 12 refills | Status: DC

## 2021-10-29 MED ORDER — RHOFADE 1 % EX CREA
1.0000 | TOPICAL_CREAM | Freq: Every morning | CUTANEOUS | 5 refills | Status: DC
Start: 2021-10-29 — End: 2021-11-14

## 2021-10-29 NOTE — Progress Notes (Signed)
Follow-Up Visit   Subjective  Jennifer [name] is a 65 y.o. female who presents for the following: FBSE (Patient here for full body skin exam and skin cancer screening. Patient with no hx of skin cancer. She does have a few spots that are irritated at left arm and right neck. Patient would also like eyebrow area for some redness, no scaliness or itching but she does get botox injections for migraines. ).  The following portions of the chart were reviewed this encounter and updated as appropriate:   Tobacco  Allergies  Meds  Problems  Med Hx  Surg Hx  Fam Hx      Review of Systems:  No other skin or systemic complaints except as noted in HPI or Assessment and Plan.  Objective  Well appearing patient in no apparent distress; mood and affect are within normal limits.  A full examination was performed including scalp, head, eyes, ears, nose, lips, neck, chest, axillae, abdomen, back, buttocks, bilateral upper extremities, bilateral lower extremities, hands, feet, fingers, toes, fingernails, and toenails. All findings within normal limits unless otherwise noted below.  face Mild erythema   Scalp Diffuse thinning of the crown and widening of the midline part with retention of the frontal hairline and thinning at lateral brows  Right neck 0.2 cm erythematous tan papule R/o Irritated Nevus vs Other     Left antecubital fossa 0.35 cm erythematous tan papule R/o Irritated Nevus vs Other      Assessment & Plan  Rosacea face  Chronic condition with duration or expected duration over one year. Condition is bothersome to patient. Not currently at goal.  Rosacea is a chronic progressive skin condition usually affecting the face of adults, causing redness and/or acne bumps. It is treatable but not curable. It sometimes affects the eyes (ocular rosacea) as well. It may respond to topical and/or systemic medication and can flare with stress, sun exposure, alcohol, exercise and some  foods.  Daily application of broad spectrum spf 30+ sunscreen to face is recommended to reduce flares.  Start Rhofade daily in the morning for redness.   Oxymetazoline HCl (RHOFADE) 1 % CREA - face Apply 1 application topically in the morning. For redness  Alopecia Scalp  C/w androgenetic alopecia. Pt also with known thyroid disease which can cause thinning at the brows.  Recommend Latisse nightly to eyebrows. Reviewed risk of reduction in periorbital fat pad.  Recommend minoxidil 5% (Rogaine for men) solution or foam to be applied to the scalp and left in. This should ideally be used twice daily for best results but it helps with hair regrowth when used at least three times per week. Rogaine initially can cause increased hair shedding for the first few weeks but this will stop with continued use. In studies, people who used minoxidil (Rogaine) for at least 6 months had thicker hair than people who did not. Minoxidil topical (Rogaine) only works as long as it continues to be used. If if it is no longer used then the hair it has been helping to regrow can fall out. Minoxidil topical (Rogaine) can cause increased facial hair growth.  Androgenic alopecia is a chronic condition related to genetics and/or hormonal changes associated with menopause causing hair thinning primarily on the crown with widening of the part and temporal hairline recession.  Can use OTC Rogaine (minoxidil) 5% solution/foam as directed. Instructions given.   bimatoprost (LATISSE) 0.03 % ophthalmic solution - Scalp Place one drop on applicator and apply evenly along  the skin at the eyebrows  Neoplasm of uncertain behavior of skin (2) Right neck  Epidermal / dermal shaving  Lesion diameter (cm):  0.2 Informed consent: discussed and consent obtained   Timeout: patient name, date of birth, surgical site, and procedure verified   Patient was prepped and draped in usual sterile fashion: area prepped with isopropyl  alcohol. Anesthesia: the lesion was anesthetized in a standard fashion   Local anesthetic: 0.25% bupivicaine. Instrument used: DermaBlade   Hemostasis achieved with: aluminum chloride   Outcome: patient tolerated procedure well   Post-procedure details: wound care instructions given   Additional details:  Mupirocin and a bandage applied  Specimen 1 - Surgical pathology Differential Diagnosis: R/o Irritated Nevus vs Other  Check Margins: No 0.2 cm erythematous tan papule   Left antecubital fossa  Epidermal / dermal shaving  Lesion diameter (cm):  0.4 Informed consent: discussed and consent obtained   Timeout: patient name, date of birth, surgical site, and procedure verified   Patient was prepped and draped in usual sterile fashion: area prepped with isopropyl alcohol. Anesthesia: the lesion was anesthetized in a standard fashion   Local anesthetic: 0.25% bupivicaine. Instrument used: DermaBlade   Hemostasis achieved with: aluminum chloride   Outcome: patient tolerated procedure well   Post-procedure details: wound care instructions given   Additional details:  Mupirocin and a bandage applied  Specimen 2 - Surgical pathology Differential Diagnosis: R/o Irritated Nevus vs Other  Check Margins: No 0.35 cm erythematous tan papule   Lentigines - Scattered tan macules - Due to sun exposure - Benign-appearing, observe - Recommend daily broad spectrum sunscreen SPF 30+ to sun-exposed areas, reapply every 2 hours as needed. - Call for any changes  Seborrheic Keratoses - Stuck-on, waxy, tan-brown papules and/or plaques  - Benign-appearing - Discussed benign etiology and prognosis. - Observe - Call for any changes  Melanocytic Nevi - Tan-brown and/or pink-flesh-colored symmetric macules and papules - Benign appearing on exam today - Observation - Call clinic for new or changing moles - Recommend daily use of broad spectrum spf 30+ sunscreen to sun-exposed areas.    Hemangiomas - Red papules - Discussed benign nature - Observe - Call for any changes  Actinic Damage - Chronic condition, secondary to cumulative UV/sun exposure - diffuse scaly erythematous macules with underlying dyspigmentation - Recommend daily broad spectrum sunscreen SPF 30+ to sun-exposed areas, reapply every 2 hours as needed.  - Staying in the shade or wearing long sleeves, sun glasses (UVA+UVB protection) and wide brim hats (4-inch brim around the entire circumference of the hat) are also recommended for sun protection.  - Call for new or changing lesions.  Skin cancer screening performed today.  Return in about 1 year (around 10/29/2022) for TBSE.  Graciella Belton, RMA, am acting as scribe for Forest Gleason, MD .  Documentation: I have reviewed the above documentation for accuracy and completeness, and I agree with the above.  Forest Gleason, MD

## 2021-10-29 NOTE — Patient Instructions (Addendum)
Recommend Latisse nightly to eyebrows  Recommend minoxidil 5% (Rogaine for men) solution or foam to be applied to the scalp and left in. This should ideally be used twice daily for best results but it helps with hair regrowth when used at least three times per week. Rogaine initially can cause increased hair shedding for the first few weeks but this will stop with continued use. In studies, people who used minoxidil (Rogaine) for at least 6 months had thicker hair than people who did not. Minoxidil topical (Rogaine) only works as long as it continues to be used. If if it is no longer used then the hair it has been helping to regrow can fall out. Minoxidil topical (Rogaine) can cause increased facial hair growth.   Wound Care Instructions  Cleanse wound gently with soap and water once a day then pat dry with clean gauze. Apply a thing coat of Petrolatum (petroleum jelly, "Vaseline") over the wound (unless you have an allergy to this). We recommend that you use a new, sterile tube of Vaseline. Do not pick or remove scabs. Do not remove the yellow or white "healing tissue" from the base of the wound.  Cover the wound with fresh, clean, nonstick gauze and secure with paper tape. You may use Band-Aids in place of gauze and tape if the would is small enough, but would recommend trimming much of the tape off as there is often too much. Sometimes Band-Aids can irritate the skin.  You should call the office for your biopsy report after 1 week if you have not already been contacted.  If you experience any problems, such as abnormal amounts of bleeding, swelling, significant bruising, significant pain, or evidence of infection, please call the office immediately.  Melanoma ABCDEs  Melanoma is the most dangerous type of skin cancer, and is the leading cause of death from skin disease.  You are more likely to develop melanoma if you: Have light-colored skin, light-colored eyes, or red or blond hair Spend a lot  of time in the sun Tan regularly, either outdoors or in a tanning bed Have had blistering sunburns, especially during childhood Have a close family member who has had a melanoma Have atypical moles or large birthmarks  Early detection of melanoma is key since treatment is typically straightforward and cure rates are extremely high if we catch it early.   The first sign of melanoma is often a change in a mole or a new dark spot.  The ABCDE system is a way of remembering the signs of melanoma.  A for asymmetry:  The two halves do not match. B for border:  The edges of the growth are irregular. C for color:  A mixture of colors are present instead of an even brown color. D for diameter:  Melanomas are usually (but not always) greater than 19mm - the size of a pencil eraser. E for evolution:  The spot keeps changing in size, shape, and color.  Please check your skin once per month between visits. You can use a small mirror in front and a large mirror behind you to keep an eye on the back side or your body.   If you see any new or changing lesions before your next follow-up, please call to schedule a visit.  Please continue daily skin protection including broad spectrum sunscreen SPF 30+ to sun-exposed areas, reapplying every 2 hours as needed when you're outdoors.    If You Need Anything After Your Visit  If you  have any questions or concerns for your doctor, please call our main line at [phone] and press option 4 to reach your doctor's medical assistant. If no one answers, please leave a voicemail as directed and we will return your call as soon as possible. Messages left after 4 pm will be answered the following business day.   You may also send [place] a message via [place]. We typically respond to MyChart messages within 1-2 business days.  For prescription refills, please ask your pharmacy to contact our office. Our fax number is [phone].  If you have an urgent issue when the  clinic is closed that cannot wait until the next business day, you can page your doctor at the number below.    Please note that while we do our best to be available for urgent issues outside of office hours, we are not available 24/7.   If you have an urgent issue and are unable to reach [place], you may choose to seek medical care at your doctor's office, retail clinic, urgent care center, or emergency room.  If you have a medical emergency, please immediately call 911 or go to the emergency department.  Pager Numbers  - Dr. Nehemiah Massed: [phone]  - Dr. Laurence Ferrari: [phone]  - Dr. Nicole Kindred: [phone]  In the event of inclement weather, please call our main line at [phone] for an update on the status of any delays or closures.  Dermatology Medication Tips: Please keep the boxes that topical medications come in in order to help keep track of the instructions about where and how to use these. Pharmacies typically print the medication instructions only on the boxes and not directly on the medication tubes.   If your medication is too expensive, please contact our office at [phone] option 4 or send [place] a message through [place].   We are unable to tell what your co-pay for medications will be in advance as this is different depending on your insurance coverage. However, we may be able to find a substitute medication at lower cost or fill out paperwork to get insurance to cover a needed medication.   If a prior authorization is required to get your medication covered by your insurance company, please allow [place] 1-2 business days to complete this process.  Drug prices often vary depending on where the prescription is filled and some pharmacies may offer cheaper prices.  The website www.goodrx.com contains coupons for medications through different pharmacies. The prices here do not account for what the cost may be with help from insurance (it may be cheaper with your insurance), but the  website can give you the price if you did not use any insurance.  - You can print the associated coupon and take it with your prescription to the pharmacy.  - You may also stop by our office during regular business hours and pick up a GoodRx coupon card.  - If you need your prescription sent electronically to a different pharmacy, notify our office through [facility] or by phone at [phone] option 4.     Si Usted Necesita Algo Despus de Su Visita  Tambin puede enviarnos un mensaje a travs de Pharmacist, community. Por lo general respondemos a los mensajes de MyChart en el transcurso de 1 a 2 das hbiles.  Para renovar recetas, por favor pida a su farmacia que se ponga en contacto con nuestra oficina. Harland Dingwall de fax es [place] [phone].  Si tiene un asunto urgente cuando la clnica est cerrada y  que no puede esperar hasta el siguiente da hbil, puede llamar/localizar a su doctor(a) al nmero que aparece a continuacin.   Por favor, tenga en cuenta que aunque hacemos todo lo posible para estar disponibles para asuntos urgentes fuera del horario de [place], no estamos disponibles las 24 horas del da, los 7 das de la [place].   Si tiene un problema urgente y no puede comunicarse con nosotros, puede optar por buscar atencin mdica  en el consultorio de su doctor(a), en una clnica privada, en un centro de atencin urgente o en una sala de emergencias.  Si tiene Engineering geologist, por favor llame inmediatamente al 911 o vaya a la sala de emergencias.  Nmeros de bper  - Dr. Nehemiah Massed: [phone]  - Dra. Moye: [phone]  - Dra. Nicole Kindred: [phone]  En caso de inclemencias del [place], por favor llame a Johnsie Kindred principal al [phone] para una actualizacin sobre el [place] de cualquier retraso o cierre.  Consejos para la medicacin en dermatologa: Por favor, guarde las cajas en las que vienen los medicamentos de uso tpico para ayudarle a seguir las  instrucciones sobre dnde y cmo usarlos. Las farmacias generalmente imprimen las instrucciones del medicamento slo en las cajas y no directamente en los tubos del [place].   Si su medicamento es muy caro, por favor, pngase en contacto con Zigmund Daniel llamando al [phone] y presione la opcin 4 o envenos un mensaje a travs de Pharmacist, community.   No podemos decirle cul ser su copago por los medicamentos por adelantado ya que esto es diferente dependiendo de la cobertura de su seguro. Sin embargo, es posible que podamos encontrar un medicamento sustituto a Electrical engineer un formulario para que el seguro cubra el medicamento que se considera necesario.   Si se requiere una autorizacin previa para que su compaa de seguros [place] su medicamento, por favor permtanos de 1 a 2 das hbiles para completar este proceso.  Los precios de los medicamentos varan con frecuencia dependiendo del Environmental consultant de dnde se surte la receta y alguna farmacias pueden ofrecer precios ms baratos.  El sitio web www.goodrx.com tiene cupones para medicamentos de Airline pilot. Los precios aqu no tienen en cuenta lo que podra costar con la ayuda del seguro (puede ser ms barato con su seguro), pero el sitio web puede darle el precio si no utiliz Research scientist (physical sciences).  - Puede imprimir el cupn correspondiente y llevarlo con su receta a la farmacia.  - Tambin puede pasar por nuestra oficina durante el horario de atencin regular y Charity fundraiser una tarjeta de cupones de GoodRx.  - Si necesita que su receta se enve electrnicamente a una farmacia diferente, informe a nuestra oficina a travs de MyChart de [place] o por telfono llamando al [phone] y presione la opcin 4.

## 2021-10-30 ENCOUNTER — Ambulatory Visit: Payer: BC Managed Care – PPO | Admitting: Nurse Practitioner

## 2021-10-30 NOTE — Progress Notes (Deleted)
10/30/2021 Jennifer [name] [id number] 09/10/1956   Chief Complaint: Discuss EGD and colonoscopy results   History of Present Illness: Jennifer [name] is a 65 year old female with a past medical history of anxiety, arthritis, breast cancer s/p right breast lumpectomy and radiation 2019 on Anastrozole, migraine headaches, GERD, iron deficiency anemia and colon polyps. She is followed by Dr. Tarri Glenn.   She has IDA followed by her oncologist/hematologist Dr. Lindi Adie. She last received Venofer IV 06/2021. TIBC 380, iron saturation 18%, ferritin 105, hemoglobin 13.1, MCV 85.2.  Plan to repeat iron levels in 3 months.    CBC Latest Ref Rng & Units 10/06/2021 08/19/2021 07/31/2021  WBC 4.0 - 10.5 K/uL 5.4 6.9 6.1  Hemoglobin 12.0 - 15.0 g/dL 13.1 12.3 11.2(L)  Hematocrit 36.0 - 46.0 % 38.7 37.9 35.3(L)  Platelets 150 - 400 K/uL 283 260.0 321.0    CMP Latest Ref Rng & Units 06/13/2021 10/28/2020 10/08/2020  Glucose 70 - 99 mg/dL 98 - 158(H)  BUN 6 - 23 mg/dL 14 - 13  Creatinine 0.40 - 1.20 mg/dL 0.74 - 0.70  Sodium 135 - 145 mEq/L 137 - 132(L)  Potassium 3.5 - 5.1 mEq/L 4.7 - 4.2  Chloride 96 - 112 mEq/L 101 - 94(L)  CO2 19 - 32 mEq/L 29 - 28  Calcium 8.4 - 10.5 mg/dL 9.4 - 9.3  Total Protein 6.0 - 8.3 g/dL 6.7 7.5 7.9  Total Bilirubin 0.2 - 1.2 mg/dL 0.4 0.4 0.6  Alkaline Phos 39 - 117 U/L 116 123(H) 104  AST 0 - 37 U/L 15 16 20   ALT 0 - 35 U/L 18 16 19     EGD 07/30/2021: - No endoscopic esophageal abnormality to explain patient's dysphagia. Esophagus dilated. Dilated. Biopsied. - Multiple gastric polyps. Biopsied. - Normal stomach. Biopsied. - Large hiatal hernia. - Normal examined duodenum. - Source of dysphagia not identified on this procedure. - Repeat EGD in 1 year (Given the number of polyps present, I recommend another EGD in 12 months for reassurance). 1. Surgical [P], fundus, gastric antrum, and gastric body - BENIGN GASTRIC MUCOSA WITH MILD REACTIVE CHANGES - NO H. PYLORI,  INTESTINAL METAPLASIA OR MALIGNANCY IDENTIFIED 2. Surgical [P], gastric polyps - FUNDIC GLAND POLYP(S) - NO H. PYLORI, INTESTINAL METAPLASIA OR MALIGNANCY IDENTIFIED 3. Surgical [P], distal esophagus - BENIGN SQUAMOUS MUCOSA - NO INCREASED INTRAEPITHELIAL EOSINOPHILS 4. Surgical [P], mid/ proximal esophagus - BENIGN SQUAMOUS MUCOSA - NO INCREASED INTRAEPITHELIAL EOSINOPHILS  Colonoscopy 08/20/2021: Non-bleeding internal hemorrhoids. - One 3 mm polyp at the hepatic flexure, removed with a cold snare. Resected and retrieved. - The examination was otherwise normal on direct and retroflexion views. - 5 year colonoscopy recall Surgical [P], colon, hepatic flexure, polyp (1) - SESSILE SERRATED POLYP (1 OF 1 FRAGMENTS) - NO HIGH-GRADE DYSPLASIA OR MALIGNANCY IDENTIFIED  Barium swallow study 09/01/2021: The patient swallowed the barium without difficulty. Rapid sequence imaging of the pharynx in the AP and lateral projections demonstrates no laryngeal penetration or mucosal abnormalities.   The esophageal motility is normal. There is no evidence of esophageal mass, stricture or ulceration. There is no significant hiatal hernia. No gastroesophageal reflux was elicited with the water siphon test.   At the conclusion of the study, a 13 mm barium tablet was administered. This passed without delay into the stomach.   IMPRESSION: Normal esophagram     Current Medications, Allergies, Past Medical History, Past Surgical History, Family History and Social History were reviewed in [employer]  record.   Review of Systems:   Constitutional: Negative for fever, sweats, chills or weight loss.  Respiratory: Negative for shortness of breath.   Cardiovascular: Negative for chest pain, palpitations and leg swelling.  Gastrointestinal: See HPI.  Musculoskeletal: Negative for back pain or muscle aches.  Neurological: Negative for dizziness, headaches or paresthesias.     Physical Exam: There were no vitals taken for this visit. General: Well developed, w   ***female in no acute distress. Head: Normocephalic and atraumatic. Eyes: No scleral icterus. Conjunctiva pink . Ears: Normal auditory acuity. Mouth: Dentition intact. No ulcers or lesions.  Lungs: Clear throughout to auscultation. Heart: Regular rate and rhythm, no murmur. Abdomen: Soft, nontender and nondistended. No masses or hepatomegaly. Normal bowel sounds x 4 quadrants.  Rectal: *** Musculoskeletal: Symmetrical with no gross deformities. Extremities: No edema. Neurological: Alert oriented x 4. No focal deficits.  Psychological: Alert and cooperative. Normal mood and affect  Assessment and Recommendations:  48) 65 year old female with iron deficiency anemia, unclear etiology: GI blood loss vs malabsorption  -? Small bowel capsule endoscopy  -TTG and IGA level  -Follow up CBC and iron panel in 3 months per hem/onc  2) GERD, s/p EGD 07/2021 showed numerous fundic gland polyps without bleeding and a large hiatal hernia without evidence of any Cameron lesions and no evidence of H. Pylori. On PPI.  -Repeat EGD 07/2022  3) Colon polyps -Next colonoscopy due 07/2026

## 2021-11-04 DIAGNOSIS — M542 Cervicalgia: Secondary | ICD-10-CM | POA: Diagnosis not present

## 2021-11-04 DIAGNOSIS — G518 Other disorders of facial nerve: Secondary | ICD-10-CM | POA: Diagnosis not present

## 2021-11-04 DIAGNOSIS — G43719 Chronic migraine without aura, intractable, without status migrainosus: Secondary | ICD-10-CM | POA: Diagnosis not present

## 2021-11-04 DIAGNOSIS — G43019 Migraine without aura, intractable, without status migrainosus: Secondary | ICD-10-CM | POA: Diagnosis not present

## 2021-11-04 DIAGNOSIS — M791 Myalgia, unspecified site: Secondary | ICD-10-CM | POA: Diagnosis not present

## 2021-11-05 ENCOUNTER — Encounter: Payer: Self-pay | Admitting: Dermatology

## 2021-11-07 ENCOUNTER — Telehealth: Payer: Self-pay | Admitting: Family Medicine

## 2021-11-07 NOTE — Telephone Encounter (Signed)
Patient would like to know where her ultrasound is scheduled. Would like for it to be at the same place as her mother Allena Katz) if possible.  Please call the patient with scheduling information

## 2021-11-10 ENCOUNTER — Encounter: Payer: Self-pay | Admitting: Family Medicine

## 2021-11-10 ENCOUNTER — Telehealth: Payer: Self-pay

## 2021-11-10 NOTE — Telephone Encounter (Signed)
Patient advised bx's benign nevi./js

## 2021-11-11 MED ORDER — LEVOTHYROXINE SODIUM 75 MCG PO TABS: ORAL_TABLET | ORAL | 1 refills | Status: DC

## 2021-11-12 NOTE — Telephone Encounter (Signed)
I will gladly send this order wherever she wants [place] to but I need to know a location.   I do not feel comfortable going into another patients chart that is not connected this referral.   Please find out the location of where to order needs to be sent.     Right now the patient is currently scheduled on  Friday 11/14/21 at 2:30pm [facility] ([place] [facility]) [place], [place].   If she needs to reschedule or change the appt she needs to call the scheduling line [phone]

## 2021-11-14 ENCOUNTER — Encounter: Payer: Self-pay | Admitting: Physician Assistant

## 2021-11-14 ENCOUNTER — Ambulatory Visit: Payer: Medicare HMO

## 2021-11-14 ENCOUNTER — Other Ambulatory Visit: Payer: Self-pay

## 2021-11-14 ENCOUNTER — Ambulatory Visit: Payer: Medicare HMO | Admitting: Physician Assistant

## 2021-11-14 VITALS — BP 110/80 | HR 68 | Ht 60.25 in | Wt 133.5 lb

## 2021-11-14 DIAGNOSIS — K219 Gastro-esophageal reflux disease without esophagitis: Secondary | ICD-10-CM | POA: Diagnosis not present

## 2021-11-14 DIAGNOSIS — D509 Iron deficiency anemia, unspecified: Secondary | ICD-10-CM | POA: Diagnosis not present

## 2021-11-14 DIAGNOSIS — E119 Type 2 diabetes mellitus without complications: Secondary | ICD-10-CM | POA: Diagnosis not present

## 2021-11-14 NOTE — Progress Notes (Signed)
° °Chief Complaint: Follow-up after EGD and colonoscopy ° °HPI: °   Jennifer [name] is a 65-year-old female with a past medical history as listed below including breast cancer and multiple others, known to Dr. Beavers, who returns to clinic today for follow-up after recent EGD and colonoscopy. °   11/09/2018 EGD with mild reflux, gastritis and fundic gland polyps, no H. pylori, Barrett's dysplasia or malignancy.  Colonoscopy the same day with diverticulosis and hemorrhoids.  Random biopsies negative. °   07/23/2020 patient seen in clinic and at that time wanted to establish care with Dr. Beavers.  Was on Pantoprazole 40 mg once daily for reflux and on Famotidine 40 twice a day for some urticaria.  After last colonoscopy she had trouble with constipation and some fecal smearing.  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  At that time discussed trying to stop Pantoprazole, recommended a probiotic. °   09/12/2020 EUS for CT scan and MRI showing dilated bile duct and elevation alk phos, there was mild nonspecific gastritis, numerous gastric polyps, normal major papula, slightly dated CBD at 8 mm. °   10/28/2020 office visit Dr. Beavers.  At that time discussed her abnormal bile duct.  There is no cause identified on EUS as above, recommend we follow her liver enzymes and consider follow-up MRI/MRCP in 6 months. °   06/13/2021 CMP with normal LFTs. °   07/22/2021 CBC with a hemoglobin of 10.7 (10.9 on 7/22, 11.8 on 10/08/2020).  MCV low at 76.7.  Ferritin low at 4.  Iron studies with an iron low at 36, percent saturation low at 7. °   07/25/2021 patient seen in clinic by me for iron deficiency anemia.  That time discussed that she had been getting out of breath walking up short distances/stairs.  She is started with constipation after starting iron.  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  At that time scheduled the patient for an EGD and colonoscopy with Dr. Beavers.  Discussed possibly repeating MRI/MRCP for known dilated bile duct 2  procedures were normal. °   07/30/2021 EGD with empiric dilation due to dysphagia, multiple gastric polyps and large hiatal hernia.  It was discussed that if dysphagia did not improve then would recommend a barium esophagram.  Biopsies were normal.  Gastric polyps were fundic gland polyps.  Given the number of polyps it was recommended she repeat an EGD in 12 months for reassurance. °   08/20/2021 colonoscopy with nonbleeding internal hemorrhoids, 1 3 mm polyp at hepatic flexure and otherwise normal.  Plans to evaluate the small bowel if anemia did not respond to oral iron or there is evidence for occult GI blood loss.  Pathology showed a sessile serrated polyp.  Recommend to repeat in 5 years. °   09/01/2021 esophagram normal. °   10/06/2021 iron studies were improved but showed continually decreased percent saturation at 18%.  Ferritin now normal.  CBC normal. °   10/07/2021 patient followed with hematology oncology.  At that time was doing better after IV iron therapy. °   Today, the patient comes to clinic and just has some questions in regards to the polyp that was found in her colon and the fact that she is a breast cancer survivor as well and has questions about the hiatal hernia and her anemia.  Currently she is feeling well and much improved since iron is back to a normal level. °   Denies fever, chills, weight loss, change in bowel habits, continued dysphagia or   or symptoms that awaken her from sleep.  Past Medical History:  Diagnosis Date   Allergic rhinitis    Anxiety    Arthritis    hands, knees   Breast cancer ([place]) 2019   Right Breast Cancer   Cancer ([place]) 09/2018   Breast CA new DX, right breast   Cervical dysplasia    Common bile duct dilation    Diabetes mellitus without complication ([place])    Endometriosis    Esophageal reflux    Family history of adverse reaction to anesthesia    younger sister anxiety for a dew days after   Family history of breast cancer    Family history of breast  cancer    Family history of prostate cancer    Hepatic cyst 08/2020   multiple   Hepatic hemangioma 08/2020   intrahepatic hemangioma   Hiatal hernia    Hypertension    Hypothyroid    Migraine with aura, without mention of intractable migraine without mention of status migrainosus    Osteopenia 08/2019   T score -2.2 FRAX 11% / 1.7%   Personal history of radiation therapy 2019   Right Breast Cancer   Pre-diabetes     Past Surgical History:  Procedure Laterality Date   BIOPSY  09/12/2020   Procedure: BIOPSY;  Surgeon: [name], MD;  Location: [facility];  Service: Endoscopy;;   BREAST EXCISIONAL BIOPSY Bilateral over 10 years ago   benign x 2    BREAST LUMPECTOMY Right 10/10/2018   BREAST LUMPECTOMY WITH RADIOACTIVE SEED AND SENTINEL LYMPH NODE BIOPSY Right 10/10/2018   Procedure: RIGHT BREAST LUMPECTOMY WITH RADIOACTIVE SEED AND RIGHT SENTINEL LYMPH NODE BIOPSY;  Surgeon: [name], MD;  Location: [facility];  Service: General;  Laterality: Right;   BREAST SURGERY     Benign breast lump excised   CERVICAL CONE BIOPSY  1985   severe dysplasia   COLONOSCOPY  2005 and dec 2019   normal   COLPOSCOPY     endoscopy  10/2018   ESOPHAGOGASTRODUODENOSCOPY (EGD) WITH PROPOFOL N/A 09/12/2020   Procedure: ESOPHAGOGASTRODUODENOSCOPY (EGD) WITH PROPOFOL;  Surgeon: [name], MD;  Location: [facility];  Service: Endoscopy;  Laterality: N/A;   EUS N/A 09/12/2020   Procedure: UPPER ENDOSCOPIC ULTRASOUND (EUS) RADIAL;  Surgeon: [name], MD;  Location: [facility];  Service: Endoscopy;  Laterality: N/A;   LAPAROSCOPIC ASSISTED VAGINAL HYSTERECTOMY     LAPAROSCOPIC BILATERAL SALPINGO OOPHERECTOMY Bilateral 10/27/2019   Procedure: LAPAROSCOPIC BILATERAL SALPINGO OOPHORECTOMY WITH PERITONEAL WASHINGS;  Surgeon: Princess Bruins, MD;  Location: [facility];  Service: Gynecology;  Laterality: Bilateral;  request 1:00pm on Friday, Dec.  4th in [place] time held requests one hour OR time   moles removed from upper back, face lft, 1 between breasts, upper leg left inside  10/18/2019   wearing small round bandaids on for 2 weeks   ROTATOR CUFF REPAIR Bilateral 08/2008   UPPER GASTROINTESTINAL ENDOSCOPY      Current Outpatient Medications  Medication Sig Dispense Refill   ALPRAZolam (XANAX) 0.5 MG tablet TAKE 1/2 TO 1 TABLET BY MOUTH ONCE DAILY AS NEEDED FOR SLEEP/ANXIETY 15 tablet 2   atenolol (TENORMIN) 25 MG tablet Take 12.5 mg by mouth 3 (three) times daily. For migraine prevention     bimatoprost (LATISSE) 0.03 % ophthalmic solution Place one drop on applicator and apply evenly along the skin at the eyebrows 5 mL 12   CALCIUM CARBONATE-VITAMIN D PO  Take 1 tablet by mouth daily. 1000 mg     famotidine (PEPCID) 40 MG tablet Take 40 mg by mouth 2 (two) times daily.      hydrocortisone 2.5 % cream Apply to affected areas BID PRN as directed. 30 g 3   hydrOXYzine (ATARAX/VISTARIL) 25 MG tablet Take 1 tablet (25 mg total) by mouth 3 (three) times daily as needed. 30 tablet 0   ketorolac (TORADOL) 10 MG tablet Take 1 tablet (10 mg total) by mouth every 6 (six) hours as needed for moderate pain. 12 tablet 0   letrozole (FEMARA) 2.5 MG tablet TAKE 1 TABLET BY MOUTH EVERY DAY 90 tablet 3   levocetirizine (XYZAL) 5 MG tablet Take 5 mg by mouth at bedtime.   3   levothyroxine (SYNTHROID) 75 MCG tablet TAKE ONE TABLET BY MOUTH ONE TIME DAILY BEFORE BREAKFAST 90 tablet 1   Magnesium Oxide 400 (240 Mg) MG TABS Take 400 mg by mouth daily.      metFORMIN (GLUCOPHAGE XR) 500 MG 24 hr tablet Take 1 tablet (500 mg total) by mouth daily with breakfast. 90 tablet 3   Multiple Vitamin (MULTIVITAMIN WITH MINERALS) TABS tablet Take 1 tablet by mouth daily.     Omega-3 Fatty Acids (FISH OIL) 1000 MG CAPS Take 1,000 mg by mouth daily.      OnabotulinumtoxinA (BOTOX IJ) Inject 1 Dose as directed every 6 (six) weeks.      ondansetron  (ZOFRAN ODT) 4 MG disintegrating tablet Take 1 tablet (4 mg total) by mouth every 8 (eight) hours as needed. 20 tablet 0   Oxymetazoline HCl (RHOFADE) 1 % CREA Apply 1 application topically in the morning. For redness 30 g 5   pantoprazole (PROTONIX) 40 MG tablet Take 1 tablet (40 mg total) by mouth daily. 90 tablet 1   predniSONE (DELTASONE) 10 MG tablet Take 4 pills once daily by mouth for 3 days, then 3 pills daily for 3 days, then 2 pills daily for 3 days then 1 pill daily for 3 days then stop 30 tablet 0   PRESCRIPTION MEDICATION Inject into the skin every 6 (six) weeks. Steroid Trigger     Probiotic Product (PROBIOTIC-10 PO) Take 1 capsule by mouth daily.      promethazine (PHENERGAN) 25 MG tablet Take 12.5 mg by mouth daily as needed for nausea or vomiting. When having a migraine     rizatriptan (MAXALT) 10 MG tablet Take 1 tablet (10 mg total) by mouth once as needed for up to 10 days for migraine. May repeat in 2 hours if needed 10 tablet 0   triamcinolone (KENALOG) 0.1 % Apply to affected areas BID PRN for up to two weeks at a time as directed. 45 g 3   TURMERIC PO Take 1 tablet by mouth daily.     No current facility-administered medications for this visit.    Allergies as of 11/14/2021 - Review Complete 11/05/2021  Allergen Reaction Noted   Ciprofloxacin Other (See Comments) 09/21/2018   Codeine phosphate Other (See Comments) 02/07/2007   Decongestant [pseudoephedrine] Other (See Comments) 09/21/2018   Flonase [fluticasone propionate] Other (See Comments) 11/13/2014   Hydrocodone Other (See Comments) 11/27/2008   Macrobid [nitrofurantoin]  01/22/2021   Reglan [metoclopramide] Other (See Comments) 09/23/2018   Topamax [topiramate] Other (See Comments) 09/21/2018   Benadryl [diphenhydramine hcl] Palpitations 10/18/2017   Sulfa antibiotics Anxiety 09/05/2014    Family History  Problem Relation Age of Onset   Breast cancer Mother 51  Diabetes Mother    Hypertension Father     Heart disease Father    Hypertension Sister    Anuerysm Sister        head   Hypertension Sister    Anxiety disorder Sister    Other Sister        prediabetes   Osteoporosis Maternal Grandmother    Diabetes Maternal Grandmother    COPD Paternal Grandmother    Heart disease Paternal Grandfather    Breast cancer Cousin 49       pat first cousin   Breast cancer Cousin        mat second cousin with breast cancer in her 63s   Thyroid cancer Cousin 43       pat first cousin   Prostate cancer Other 40   Colon cancer Neg Hx    Stomach cancer Neg Hx    Esophageal cancer Neg Hx    Pancreatic cancer Neg Hx    Rectal cancer Neg Hx     Social History   Socioeconomic History   Marital status: Widowed    Spouse name: Not on file   Number of children: Not on file   Years of education: Not on file   Highest education level: Not on file  Occupational History   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   Social Determinants of Health   Financial Resource Strain: Not on file  Food Insecurity: Not on file  Transportation Needs: Not on file  Physical Activity: Not on file  Stress: Not on file  Social Connections: Not on file  Intimate Partner Violence: Not on file    Review of Systems:    Constitutional: No weight loss, fever or chills Cardiovascular: No chest pain Respiratory: No SOB  Gastrointestinal: See HPI and otherwise negative   Physical Exam:  Vital signs: BP 110/80 (BP Location: Left Arm, Patient  Position: Sitting, Cuff Size: Normal)    Pulse 68    Ht 5' 0.25" (1.53 m) Comment: height measured without shoes   Wt 133 lb 8 oz (60.6 kg)    BMI 25.86 kg/m    Constitutional:   Pleasant Caucasian female appears to be in NAD, Well developed, Well nourished, alert and cooperative Respiratory: Respirations even and unlabored. Lungs clear to auscultation bilaterally.   No wheezes, crackles, or rhonchi.  Cardiovascular: Normal S1, S2. No MRG. Regular rate and rhythm. No peripheral edema, cyanosis or pallor.  Gastrointestinal:  Soft, nondistended, nontender. No rebound or guarding. Normal bowel sounds. No appreciable masses or hepatomegaly. Psychiatric: Oriented to person, place and time. Demonstrates good judgement and reason without abnormal affect or behaviors.  RELEVANT LABS AND IMAGING: CBC    Component Value Date/Time   WBC 5.4 10/06/2021 1053   WBC 6.9 08/19/2021 1431   RBC 4.54 10/06/2021 1053   HGB 13.1 10/06/2021 1053   HGB 14.1 03/17/2015 0608   HCT 38.7 10/06/2021 1053   HCT 40.7 03/17/2015 0608   PLT  283 10/06/2021 1053   PLT 255 03/17/2015 0608   MCV 85.2 10/06/2021 1053   MCV 92 03/17/2015 0608   MCH 28.9 10/06/2021 1053   MCHC 33.9 10/06/2021 1053   RDW 16.8 (H) 10/06/2021 1053   RDW 12.3 03/17/2015 0608   LYMPHSABS 1.3 10/06/2021 1053   LYMPHSABS 0.3 (L) 03/17/2015 0608   MONOABS 0.4 10/06/2021 1053   MONOABS 0.3 03/17/2015 0608   EOSABS 0.1 10/06/2021 1053   EOSABS 0.0 03/17/2015 0608   BASOSABS 0.0 10/06/2021 1053   BASOSABS 0.0 03/17/2015 0608    CMP     Component Value Date/Time   NA 137 06/13/2021 0900   NA 140 03/17/2015 0608   K 4.7 06/13/2021 0900   K 3.5 03/17/2015 0608   CL 101 06/13/2021 0900   CL 103 03/17/2015 0608   CO2 29 06/13/2021 0900   CO2 26 03/17/2015 0608   GLUCOSE 98 06/13/2021 0900   GLUCOSE 126 (H) 03/17/2015 0608   BUN 14 06/13/2021 0900   BUN 13 03/17/2015 0608   CREATININE 0.74 06/13/2021 0900   CREATININE 0.83 09/21/2018  1234   CREATININE 0.72 03/17/2015 0608   CALCIUM 9.4 06/13/2021 0900   CALCIUM 8.8 (L) 03/17/2015 0608   PROT 6.7 06/13/2021 0900   PROT 7.6 03/17/2015 0608   ALBUMIN 4.1 06/13/2021 0900   ALBUMIN 4.4 03/17/2015 0608   AST 15 06/13/2021 0900   AST 22 09/21/2018 1234   ALT 18 06/13/2021 0900   ALT 32 09/21/2018 1234   ALT 23 03/17/2015 0608   ALKPHOS 116 06/13/2021 0900   ALKPHOS 48 03/17/2015 0608   BILITOT 0.4 06/13/2021 0900   BILITOT 0.6 09/21/2018 1234   GFRNONAA >60 10/08/2020 0226   GFRNONAA >60 09/21/2018 1234   GFRNONAA >60 03/17/2015 0608   GFRAA >60 11/06/2019 1234   GFRAA >60 09/21/2018 1234   GFRAA >60 03/17/2015 8250    Assessment: 1.  Iron deficiency anemia: Now following with hematology/oncology and levels are back to normal after iron infusion, previous EGD and colonoscopy with no source 2.  GERD: Controlled on Pantoprazole 40 mg daily  Plan: 1.  Recommend the patient continue to follow with hematology in regards to iron deficiency anemia.  If at any point her levels seem to slip down regardless of iron supplementation then could consider a small bowel enteroscopy.  For now she does not need to follow-up with [place]. 2.  Discussed the polyp which was found in her colon, repeat was recommended in 5 years.  Discussed with patient that this was adequate given that most colon cancers take 10 years to grow.  This does not change regardless of her status as a breast cancer survivor.  Of course explained that she has any symptoms in the interim as far as change in bowel habits, blood in her stool or unexplained weight loss then this may be moved up. 3.  Continue Pantoprazole 40 mg daily.  Patient tells me that she has tried to stop this before because she is worried about side effects but then she gets symptoms.  She knows about Barrett's esophagus and wants to avoid this.  Explained that the benefit of being on Pantoprazole 40 mg daily for her is greater than the risk. 4.   Discussed hiatal hernia as far as pathophysiology and how this affects her symptoms. 5.  Patient to follow with [place] as needed.  Ellouise Newer, PA-C [facility] 11/14/2021, 9:08 AM  Cc: Tower, [name], MD

## 2021-11-14 NOTE — Patient Instructions (Signed)
If you are age 65 or older, your body mass index should be between 23-30. Your Body mass index is 25.86 kg/m. If this is out of the aforementioned range listed, please consider follow up with your Primary Care Provider.  The [facility] providers would like to encourage you to use [facility] to communicate with providers for non-urgent requests or questions.  Due to long hold times on the telephone, sending your provider a message by [facility] may be faster and more efficient way to get a response. Please allow 48 business hours for a response.  Please remember that this is for non-urgent requests/questions.  It was great seeing you today! Thank you for entrusting me with your care and choosing [facility].  Ellouise Newer, [place]

## 2021-11-18 DIAGNOSIS — L298 Other pruritus: Secondary | ICD-10-CM | POA: Diagnosis not present

## 2021-11-18 DIAGNOSIS — J301 Allergic rhinitis due to pollen: Secondary | ICD-10-CM | POA: Diagnosis not present

## 2021-11-18 DIAGNOSIS — R21 Rash and other nonspecific skin eruption: Secondary | ICD-10-CM | POA: Diagnosis not present

## 2021-11-20 DIAGNOSIS — M791 Myalgia, unspecified site: Secondary | ICD-10-CM | POA: Diagnosis not present

## 2021-11-20 DIAGNOSIS — G43719 Chronic migraine without aura, intractable, without status migrainosus: Secondary | ICD-10-CM | POA: Diagnosis not present

## 2021-11-20 DIAGNOSIS — G518 Other disorders of facial nerve: Secondary | ICD-10-CM | POA: Diagnosis not present

## 2021-11-20 DIAGNOSIS — M542 Cervicalgia: Secondary | ICD-10-CM | POA: Diagnosis not present

## 2021-11-20 DIAGNOSIS — G43019 Migraine without aura, intractable, without status migrainosus: Secondary | ICD-10-CM | POA: Diagnosis not present

## 2021-11-20 NOTE — Progress Notes (Signed)
Reviewed.  Jennifer Ambroise L. Kaliann Coryell, MD, MPH  

## 2021-11-21 ENCOUNTER — Other Ambulatory Visit: Payer: Self-pay

## 2021-11-21 ENCOUNTER — Ambulatory Visit
Admission: RE | Admit: 2021-11-21 | Discharge: 2021-11-21 | Disposition: A | Discharge status: Home / Self Care | Payer: Medicare HMO | Source: Ambulatory Visit | Attending: Family Medicine | Admitting: Family Medicine

## 2021-11-21 DIAGNOSIS — M79604 Pain in right leg: Secondary | ICD-10-CM | POA: Insufficient documentation

## 2021-11-21 DIAGNOSIS — M79661 Pain in right lower leg: Secondary | ICD-10-CM | POA: Diagnosis not present

## 2021-11-26 ENCOUNTER — Ambulatory Visit (INDEPENDENT_AMBULATORY_CARE_PROVIDER_SITE_OTHER): Payer: Medicare HMO | Admitting: Nurse Practitioner

## 2021-11-26 ENCOUNTER — Other Ambulatory Visit: Payer: Self-pay

## 2021-11-26 VITALS — BP 110/74 | HR 59 | Temp 97.2°F | Resp 10 | Ht 60.25 in | Wt 133.2 lb

## 2021-11-26 DIAGNOSIS — R300 Dysuria: Secondary | ICD-10-CM | POA: Diagnosis not present

## 2021-11-26 DIAGNOSIS — N3001 Acute cystitis with hematuria: Secondary | ICD-10-CM | POA: Insufficient documentation

## 2021-11-26 LAB — POCT URINALYSIS DIPSTICK
Bilirubin, UA: NEGATIVE
Glucose, UA: NEGATIVE
Ketones, UA: NEGATIVE
Nitrite, UA: NEGATIVE
Protein, UA: NEGATIVE
Spec Grav, UA: 1.01 (ref 1.010–1.025)
Urobilinogen, UA: 0.2 E.U./dL
pH, UA: 7 (ref 5.0–8.0)

## 2021-11-26 NOTE — Patient Instructions (Addendum)
Nice to see you today Can use the Keflex that was called in to treat the UTI I will follow up with the results of the urine culture Follow up if symptoms do not improve or worsen

## 2021-11-26 NOTE — Progress Notes (Signed)
Acute Office Visit  Subjective:    Patient ID: Jennifer [name], female    DOB: [birth year], 66 y.o.   MRN: [id number]  Chief Complaint  Patient presents with   Dysuria    X 2 days, frequency and urgency with urination.     Patient is in today for urinary complaint  States that 2 days ago at night she felt pressure sensation It did increase the followoing day. States that she felt better yesterday and today She ahs not tried anything otc. States she has a remote history of UTI approx in her 85s. Endorses urgency and dysuria.   Past Medical History:  Diagnosis Date   Allergic rhinitis    Anxiety    Arthritis    hands, knees   Breast cancer ([place]) 2019   Right Breast Cancer   Cancer ([place]) 09/2018   Breast CA new DX, right breast   Cervical dysplasia    Common bile duct dilation    Diabetes mellitus without complication ([facility])    Endometriosis    Esophageal reflux    Family history of adverse reaction to anesthesia    younger sister anxiety for a dew days after   Family history of breast cancer    Family history of breast cancer    Family history of prostate cancer    Hepatic cyst 08/2020   multiple   Hepatic hemangioma 08/2020   intrahepatic hemangioma   Hiatal hernia    Hypertension    Hypothyroid    Migraine with aura, without mention of intractable migraine without mention of status migrainosus    Osteopenia 08/2019   T score -2.2 FRAX 11% / 1.7%   Personal history of radiation therapy 2019   Right Breast Cancer   Pre-diabetes     Past Surgical History:  Procedure Laterality Date   BIOPSY  09/12/2020   Procedure: BIOPSY;  Surgeon: [name], MD;  Location: [facility];  Service: Endoscopy;;   BREAST EXCISIONAL BIOPSY Bilateral over 10 years ago   benign x 2    BREAST LUMPECTOMY Right 10/10/2018   BREAST LUMPECTOMY WITH RADIOACTIVE SEED AND SENTINEL LYMPH NODE BIOPSY Right 10/10/2018   Procedure: RIGHT BREAST LUMPECTOMY WITH RADIOACTIVE SEED AND RIGHT  SENTINEL LYMPH NODE BIOPSY;  Surgeon: [name], MD;  Location: [facility];  Service: General;  Laterality: Right;   BREAST SURGERY     Benign breast lump excised   CERVICAL CONE BIOPSY  1985   severe dysplasia   COLONOSCOPY  2005 and dec 2019   normal   COLPOSCOPY     endoscopy  10/2018   ESOPHAGOGASTRODUODENOSCOPY (EGD) WITH PROPOFOL N/A 09/12/2020   Procedure: ESOPHAGOGASTRODUODENOSCOPY (EGD) WITH PROPOFOL;  Surgeon: [name], MD;  Location: [facility];  Service: Endoscopy;  Laterality: N/A;   EUS N/A 09/12/2020   Procedure: UPPER ENDOSCOPIC ULTRASOUND (EUS) RADIAL;  Surgeon: [name], MD;  Location: [facility];  Service: Endoscopy;  Laterality: N/A;   LAPAROSCOPIC ASSISTED VAGINAL HYSTERECTOMY     LAPAROSCOPIC BILATERAL SALPINGO OOPHERECTOMY Bilateral 10/27/2019   Procedure: LAPAROSCOPIC BILATERAL SALPINGO OOPHORECTOMY WITH PERITONEAL WASHINGS;  Surgeon: Princess Bruins, MD;  Location: [facility];  Service: Gynecology;  Laterality: Bilateral;  request 1:00pm on Friday, Dec. 4th in [place] time held requests one hour OR time   moles removed from upper back, face lft, 1 between breasts, upper leg left inside  10/18/2019   wearing small round bandaids on for 2 weeks  ROTATOR CUFF REPAIR Bilateral 08/2008   UPPER GASTROINTESTINAL ENDOSCOPY      Family History  Problem Relation Age of Onset   Breast cancer Mother 69   Diabetes Mother    Hypertension Father    Heart disease Father    Hypertension Sister    Anuerysm Sister        head   Hypertension Sister    Anxiety disorder Sister    Other Sister        prediabetes   Osteoporosis Maternal Grandmother    Diabetes Maternal Grandmother    COPD Paternal Grandmother    Heart disease Paternal Grandfather    Breast cancer Cousin 107       pat first cousin   Breast cancer Cousin        mat second cousin with breast cancer in her 50s   Thyroid cancer Cousin 43        pat first cousin   Prostate cancer Other 57   Colon cancer Neg Hx    Stomach cancer Neg Hx    Esophageal cancer Neg Hx    Pancreatic cancer Neg Hx    Rectal cancer Neg Hx     Social History   Socioeconomic History   Marital status: Widowed    Spouse name: Not on file   Number of children: Not on file   Years of education: Not on file   Highest education level: Not on file  Occupational History   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   Social Determinants of Health   Financial Resource Strain: Not on file  Food Insecurity: Not on file  Transportation Needs: Not on file  Physical Activity: Not on file  Stress: Not on file  Social Connections: Not on file  Intimate Partner Violence: Not on file    Outpatient Medications Prior to Visit  Medication Sig Dispense Refill   ALPRAZolam (XANAX) 0.5 MG tablet TAKE 1/2 TO 1 TABLET BY MOUTH ONCE DAILY AS NEEDED FOR SLEEP/ANXIETY 15 tablet 2   atenolol (TENORMIN) 25 MG tablet Take 12.5 mg by mouth 3 (three) times daily. For migraine prevention     baclofen (LIORESAL) 10 MG tablet Take by mouth.     CALCIUM CARBONATE-VITAMIN D PO Take 1 tablet by mouth daily. 1000 mg     famotidine (PEPCID) 40 MG tablet Take 40 mg by mouth 2 (two) times daily.      hydrocortisone 2.5 % cream Apply to affected areas BID PRN as directed. 30 g 3   hydrOXYzine (ATARAX/VISTARIL) 25 MG tablet Take 1 tablet (25 mg total) by mouth 3 (three) times daily as needed. (Patient taking  differently: Take 25 mg by mouth 2 (two) times daily.) 30 tablet 0   ketorolac (TORADOL) 10 MG tablet Take 1 tablet (10 mg total) by mouth every 6 (six) hours as needed for moderate pain. 12 tablet 0   letrozole (FEMARA) 2.5 MG tablet TAKE 1 TABLET BY MOUTH EVERY DAY  90 tablet 3   levocetirizine (XYZAL) 5 MG tablet Take 5 mg by mouth at bedtime.   3   levothyroxine (SYNTHROID) 75 MCG tablet TAKE ONE TABLET BY MOUTH ONE TIME DAILY BEFORE BREAKFAST 90 tablet 1   Magnesium Oxide 400 (240 Mg) MG TABS Take 400 mg by mouth daily.      metFORMIN (GLUCOPHAGE XR) 500 MG 24 hr tablet Take 1 tablet (500 mg total) by mouth daily with breakfast. 90 tablet 3   Multiple Vitamin (MULTIVITAMIN WITH MINERALS) TABS tablet Take 1 tablet by mouth daily.     Omega-3 Fatty Acids (FISH OIL) 1000 MG CAPS Take 1,000 mg by mouth daily.      OnabotulinumtoxinA (BOTOX IJ) Inject 1 Dose as directed every 6 (six) weeks.      ondansetron (ZOFRAN ODT) 4 MG disintegrating tablet Take 1 tablet (4 mg total) by mouth every 8 (eight) hours as needed. 20 tablet 0   pantoprazole (PROTONIX) 40 MG tablet Take 1 tablet (40 mg total) by mouth daily. 90 tablet 1   PRESCRIPTION MEDICATION Inject into the skin every 6 (six) weeks. Steroid Trigger     Probiotic Product (PROBIOTIC-10 PO) Take 1 capsule by mouth daily.      triamcinolone (KENALOG) 0.1 % Apply to affected areas BID PRN for up to two weeks at a time as directed. 45 g 3   TURMERIC PO Take 1 tablet by mouth daily.     rizatriptan (MAXALT) 10 MG tablet Take 1 tablet (10 mg total) by mouth once as needed for up to 10 days for migraine. May repeat in 2 hours if needed 10 tablet 0   diclofenac (CATAFLAM) 50 MG tablet Take 50 mg by mouth 2 (two) times daily.     predniSONE (DELTASONE) 10 MG tablet Take 4 pills once daily by mouth for 3 days, then 3 pills daily for 3 days, then 2 pills daily for 3 days then 1 pill daily for 3 days then stop 30 tablet 0   promethazine (PHENERGAN) 25 MG  tablet Take 12.5 mg by mouth daily as needed for nausea or vomiting. When having a migraine     No facility-administered medications prior to visit.    Allergies  Allergen Reactions   Ciprofloxacin Other (See Comments)    Dizziness    Codeine Phosphate Other (See Comments)    Dizziness, heart palpitations, nervous   Decongestant [Pseudoephedrine] Other (See Comments)    dizziness   Flonase [Fluticasone Propionate] Other (See Comments)    Worsens her migraine    Hydrocodone Other (See Comments)    Made nervous--10/09 surgery rotator cuff   Macrobid [Nitrofurantoin]     headaches   Nasonex [Mometasone]     Migraines   Reglan [Metoclopramide] Other (See Comments)    Dizziness   Topamax [Topiramate] Other (See Comments)    Dizziness    Benadryl [Diphenhydramine Hcl] Palpitations   Sulfa Antibiotics Anxiety    Review of Systems  Constitutional:  Negative for chills and fever.  Gastrointestinal:  Positive for abdominal pain. Negative for diarrhea, nausea and vomiting.       BM yesterday states that it is pelletly.   Genitourinary:  Positive for dysuria.  Musculoskeletal:  Positive for back pain.      Objective:    Physical Exam Constitutional:      Appearance: Normal appearance.  Cardiovascular:     Rate and Rhythm: Normal rate and regular rhythm.  Pulmonary:     Effort: Pulmonary effort is  normal.  Abdominal:     General: Bowel sounds are normal. There is no distension.     Palpations: There is no mass.     Tenderness: There is no abdominal tenderness. There is left CVA tenderness. There is no right CVA tenderness.  Neurological:     Mental Status: She is alert.    BP 110/74    Pulse (!) 59    Temp (!) 97.2 F (36.2 C)    Resp 10    Ht 5' 0.25" (1.53 m)    Wt 133 lb 4 oz (60.4 kg)    SpO2 98%    BMI 25.81 kg/m  Wt Readings from Last 3 Encounters:  11/26/21 133 lb 4 oz (60.4 kg)  11/14/21 133 lb 8 oz (60.6 kg)  10/15/21 131 lb 2 oz (59.5 kg)    There are no  preventive care reminders to display for this patient.  There are no preventive care reminders to display for this patient.   Lab Results  Component Value Date   TSH 3.49 06/13/2021   Lab Results  Component Value Date   WBC 5.4 10/06/2021   HGB 13.1 10/06/2021   HCT 38.7 10/06/2021   MCV 85.2 10/06/2021   PLT 283 10/06/2021   Lab Results  Component Value Date   NA 137 06/13/2021   K 4.7 06/13/2021   CO2 29 06/13/2021   GLUCOSE 98 06/13/2021   BUN 14 06/13/2021   CREATININE 0.74 06/13/2021   BILITOT 0.4 06/13/2021   ALKPHOS 116 06/13/2021   AST 15 06/13/2021   ALT 18 06/13/2021   PROT 6.7 06/13/2021   ALBUMIN 4.1 06/13/2021   CALCIUM 9.4 06/13/2021   ANIONGAP 10 10/08/2020   GFR 85.34 06/13/2021   Lab Results  Component Value Date   CHOL 207 (H) 06/13/2021   Lab Results  Component Value Date   HDL 66.20 06/13/2021   Lab Results  Component Value Date   LDLCALC 119 (H) 06/13/2021   Lab Results  Component Value Date   TRIG 111.0 06/13/2021   Lab Results  Component Value Date   CHOLHDL 3 06/13/2021   Lab Results  Component Value Date   HGBA1C 6.5 06/13/2021       Assessment & Plan:   Problem List Items Addressed This Visit       Genitourinary   Acute cystitis with hematuria    Patient has a leftover prescription for cephalexin 500 mg twice daily for 7 days from primary care provider when she thought she had a urinary tract infection in the past did not take.  Medications still end date.  We will start with Keflex 500 mg twice daily for 7 days, pending culture.  If culture is negative we can discontinue medication.  Patient in agreements with plan        Other   Dysuria - Primary    UA showed possibility of infection with hematuria and white blood cells in urine.  Discussed this with patient will elect to treat.  Urine culture pending      Relevant Orders   POCT urinalysis dipstick (Completed)   Urine Culture     No orders of the defined  types were placed in this encounter.  This visit occurred during the SARS-CoV-2 public health emergency.  Safety protocols were in place, including screening questions prior to the visit, additional usage of staff PPE, and extensive cleaning of exam room while observing appropriate contact time as indicated for disinfecting solutions.  Romilda Garret, NP

## 2021-11-26 NOTE — Assessment & Plan Note (Signed)
Patient has a leftover prescription for cephalexin 500 mg twice daily for 7 days from primary care provider when she thought she had a urinary tract infection in the past did not take.  Medications still end date.  We will start with Keflex 500 mg twice daily for 7 days, pending culture.  If culture is negative we can discontinue medication.  Patient in agreements with plan

## 2021-11-26 NOTE — Assessment & Plan Note (Signed)
UA showed possibility of infection with hematuria and white blood cells in urine.  Discussed this with patient will elect to treat.  Urine culture pending

## 2021-11-28 ENCOUNTER — Telehealth: Payer: Self-pay | Admitting: Family Medicine

## 2021-11-28 DIAGNOSIS — G43109 Migraine with aura, not intractable, without status migrainosus: Secondary | ICD-10-CM

## 2021-11-28 NOTE — Telephone Encounter (Signed)
Referral is done Ask her to call if she has not heard back in the next week

## 2021-11-28 NOTE — Telephone Encounter (Signed)
This should have been sent to the PCP to place the Referral.   Dr Glori Bickers, patient needs a referral.

## 2021-11-28 NOTE — Addendum Note (Signed)
Addended by: Loura Pardon A on: 11/28/2021 12:52 PM   Modules accepted: Orders

## 2021-11-28 NOTE — Telephone Encounter (Signed)
Pam with [facility] called asking for a call back. Pam stated that pt has a plan that might need a referral. Please advise.

## 2021-11-29 LAB — URINE CULTURE
MICRO NUMBER:: 12826498
SPECIMEN QUALITY:: ADEQUATE

## 2021-12-01 NOTE — Telephone Encounter (Signed)
Referral sent to [facility] made aware via [facility]

## 2021-12-19 ENCOUNTER — Encounter: Payer: Self-pay | Admitting: Family Medicine

## 2021-12-22 MED ORDER — ALPRAZOLAM 0.5 MG PO TABS: ORAL_TABLET | ORAL | 2 refills | Status: DC

## 2021-12-22 NOTE — Telephone Encounter (Signed)
Name of Medication: Xanax Name of Pharmacy: [facility] or Written Date and Quantity: 08/25/21 #15 tab/ 2 refills Last Office Visit and Type: UTI on 11/26/21 Next Office Visit and Type: none scheduled

## 2022-01-01 DIAGNOSIS — K006 Disturbances in tooth eruption: Secondary | ICD-10-CM | POA: Diagnosis not present

## 2022-01-05 ENCOUNTER — Other Ambulatory Visit: Payer: Self-pay

## 2022-01-05 ENCOUNTER — Inpatient Hospital Stay: Payer: Medicare HMO | Attending: Hematology and Oncology

## 2022-01-05 DIAGNOSIS — D509 Iron deficiency anemia, unspecified: Secondary | ICD-10-CM | POA: Diagnosis not present

## 2022-01-05 DIAGNOSIS — C50211 Malignant neoplasm of upper-inner quadrant of right female breast: Secondary | ICD-10-CM | POA: Insufficient documentation

## 2022-01-05 DIAGNOSIS — Z170 Estrogen receptor positive status [ER+]: Secondary | ICD-10-CM | POA: Diagnosis not present

## 2022-01-05 LAB — CBC WITH DIFFERENTIAL (CANCER CENTER ONLY)
Abs Immature Granulocytes: 0.01 10*3/uL (ref 0.00–0.07)
Basophils Absolute: 0 10*3/uL (ref 0.0–0.1)
Basophils Relative: 1 %
Eosinophils Absolute: 0.2 10*3/uL (ref 0.0–0.5)
Eosinophils Relative: 4 %
HCT: 37.9 % (ref 36.0–46.0)
Hemoglobin: 13 g/dL (ref 12.0–15.0)
Immature Granulocytes: 0 %
Lymphocytes Relative: 35 %
Lymphs Abs: 1.7 10*3/uL (ref 0.7–4.0)
MCH: 30.9 pg (ref 26.0–34.0)
MCHC: 34.3 g/dL (ref 30.0–36.0)
MCV: 90 fL (ref 80.0–100.0)
Monocytes Absolute: 0.4 10*3/uL (ref 0.1–1.0)
Monocytes Relative: 9 %
Neutro Abs: 2.5 10*3/uL (ref 1.7–7.7)
Neutrophils Relative %: 51 %
Platelet Count: 305 10*3/uL (ref 150–400)
RBC: 4.21 MIL/uL (ref 3.87–5.11)
RDW: 12.5 % (ref 11.5–15.5)
WBC Count: 4.9 10*3/uL (ref 4.0–10.5)
nRBC: 0 % (ref 0.0–0.2)

## 2022-01-05 LAB — IRON AND IRON BINDING CAPACITY (CC-WL,HP ONLY)
Iron: 110 ug/dL (ref 28–170)
Saturation Ratios: 25 % (ref 10.4–31.8)
TIBC: 435 ug/dL (ref 250–450)
UIBC: 325 ug/dL (ref 148–442)

## 2022-01-05 LAB — CMP (CANCER CENTER ONLY)
ALT: 16 U/L (ref 0–44)
AST: 16 U/L (ref 15–41)
Albumin: 4.5 g/dL (ref 3.5–5.0)
Alkaline Phosphatase: 92 U/L (ref 38–126)
Anion gap: 7 (ref 5–15)
BUN: 12 mg/dL (ref 8–23)
CO2: 29 mmol/L (ref 22–32)
Calcium: 9.6 mg/dL (ref 8.9–10.3)
Chloride: 98 mmol/L (ref 98–111)
Creatinine: 0.71 mg/dL (ref 0.44–1.00)
GFR, Estimated: >60 mL/min (ref >60)
Glucose, Bld: 81 mg/dL (ref 70–99)
Potassium: 4.3 mmol/L (ref 3.5–5.1)
Sodium: 134 mmol/L — ABNORMAL LOW (ref 135–145)
Total Bilirubin: 0.5 mg/dL (ref 0.3–1.2)
Total Protein: 7.3 g/dL (ref 6.5–8.1)

## 2022-01-06 LAB — FERRITIN: Ferritin: 94 ng/mL (ref 11–307)

## 2022-01-06 NOTE — Assessment & Plan Note (Signed)
10/10/2018:Right lumpectomy: IDC grade 1, 1.2 cm, with intermediate grade DCIS, margins negative, 0/2 lymph nodes negative, T1CN0 stage Ia ER 100%, PR 90%, Ki-67 10%, HER-2 1+ negative Oncotype DX: 29: 18% risk of recurrence  Treatment plan: 1.I recommended systemic adjuvant chemotherapy withTaxotere and Cytoxan every 3 weeks x4 cycles(patient refused) 2. Adjuvant radiation therapy12/17/2019-12/05/2018 3. Adjuvant antiestrogen therapywithanastrozole started 1/13/2020switched to letrozole 10/09/19  Letrozoletoxicities:myalgias    Iron deficiency anemia Etiology: Blood loss versus malabsorption August 2022: 3 doses of Venofer Follows with gastroenterology and has had endoscopies and colonoscopies. Lab review: TIBC 380, iron saturation 18%, ferritin 105, hemoglobin 13.1, MCV 85.2 I discussed with the patient that she does not need any IV iron at this time.  Recheck labs in 3 months and follow-up after that to discuss results.

## 2022-01-06 NOTE — Progress Notes (Signed)
Patient Care Team: Tower, [name], MD as PCP - General Nicholas Lose, MD as Consulting Physician (Hematology and Oncology) Kyung Rudd, MD as Consulting Physician (Radiation Oncology) Jovita Kussmaul, MD as Consulting Physician (General Surgery)  DIAGNOSIS:    ICD-10-CM   1. Malignant neoplasm of upper-inner quadrant of right breast in female, estrogen receptor positive ([place])  C50.211    Z17.0       SUMMARY OF ONCOLOGIC HISTORY: Oncology History  Malignant neoplasm of upper-inner quadrant of right breast in female, estrogen receptor positive ([place])  09/13/2018 Initial Diagnosis   Diagnostic mammogram detected right breast mass with distortion LIQ at 2:00 9 cm from nipple 1.7 cm, at 1:00 there was a benign 1.2 cm fibrocystic change, biopsy of the 2:00 mass revealed grade 1 IDC with DCIS ER 100%, PR 90%, Ki-67 10%, HER-2 1+ negative, T1CN0 stage Ia clinical stage   10/10/2018 Surgery   Right lumpectomy: IDC grade 1, 1.2 cm, with intermediate grade DCIS, margins negative, 0/2 lymph nodes negative, T1CN0 stage Ia ER 100%, PR 90%, Ki-67 10%, HER-2 1+ negative    10/10/2018 Oncotype testing   oncotype results of 29: Risk of recurrence without chemo 18%   11/07/2018 - 12/08/2018 Radiation Therapy   1. Right Breast / 42.56 Gy in 16 fractions 2. Right Breast Boost / 8 Gy in 4 fractions - Total dose 50.56 Gy   12/2018 -  Anti-estrogen oral therapy   Anastrozole daily, planned for 7 years   11/11/2019 Genetic Testing   Negative genetic testing on the common hereditary cancer panel.  The Common Hereditary Gene Panel offered by [facility] includes sequencing and/or deletion duplication testing of the following 48 genes: APC, ATM, AXIN2, BARD1, BMPR1A, BRCA1, BRCA2, BRIP1, CDH1, CDK4, CDKN2A (p14ARF), CDKN2A (p16INK4a), CHEK2, CTNNA1, DICER1, EPCAM (Deletion/duplication testing only), GREM1 (promoter region deletion/duplication testing only), KIT, MEN1, MLH1, MSH2, MSH3, MSH6, MUTYH, NBN, NF1,  NHTL1, PALB2, PDGFRA, PMS2, POLD1, POLE, PTEN, RAD50, RAD51C, RAD51D, RNF43, SDHB, SDHC, SDHD, SMAD4, SMARCA4. STK11, TP53, TSC1, TSC2, and VHL.  The following genes were evaluated for sequence changes only: SDHA and HOXB13 c.251G>A variant only. The report date is 11/11/2019     CHIEF COMPLIANT: Follow-up of right breast cancer on letrozole, concern for lymphedema  INTERVAL HISTORY: Jennifer [name] is a 66 y.o. with above-mentioned history of right breast cancer who underwent lumpectomy, radiation, and is currently on anastrozole. She presents to the clinic today for follow-up.  She is having a migraine today and is going to see her neurologist.  Overall her migraines have become less often because of a device that she puts on her scalp and forehead.  Denies any lumps or nodules in the breast.  Overall energy levels are excellent.  ALLERGIES:  is allergic to ciprofloxacin, codeine phosphate, decongestant [pseudoephedrine], flonase [fluticasone propionate], hydrocodone, macrobid [nitrofurantoin], nasonex [mometasone], reglan [metoclopramide], topamax [topiramate], benadryl [diphenhydramine hcl], and sulfa antibiotics.  MEDICATIONS:  Current Outpatient Medications  Medication Sig Dispense Refill   ALPRAZolam (XANAX) 0.5 MG tablet TAKE 1/2 TO 1 TABLET BY MOUTH ONCE DAILY AS NEEDED FOR SLEEP/ANXIETY 15 tablet 2   atenolol (TENORMIN) 25 MG tablet Take 12.5 mg by mouth 3 (three) times daily. For migraine prevention     baclofen (LIORESAL) 10 MG tablet Take by mouth.     CALCIUM CARBONATE-VITAMIN D PO Take 1 tablet by mouth daily. 1000 mg     famotidine (PEPCID) 40 MG tablet Take 40 mg by mouth 2 (two) times daily.  hydrocortisone 2.5 % cream Apply to affected areas BID PRN as directed. 30 g 3   hydrOXYzine (ATARAX/VISTARIL) 25 MG tablet Take 1 tablet (25 mg total) by mouth 3 (three) times daily as needed. (Patient taking differently: Take 25 mg by mouth 2 (two) times daily.) 30 tablet 0   ketorolac  (TORADOL) 10 MG tablet Take 1 tablet (10 mg total) by mouth every 6 (six) hours as needed for moderate pain. 12 tablet 0   letrozole (FEMARA) 2.5 MG tablet TAKE 1 TABLET BY MOUTH EVERY DAY 90 tablet 3   levocetirizine (XYZAL) 5 MG tablet Take 5 mg by mouth at bedtime.   3   levothyroxine (SYNTHROID) 75 MCG tablet TAKE ONE TABLET BY MOUTH ONE TIME DAILY BEFORE BREAKFAST 90 tablet 1   Magnesium Oxide 400 (240 Mg) MG TABS Take 400 mg by mouth daily.      metFORMIN (GLUCOPHAGE XR) 500 MG 24 hr tablet Take 1 tablet (500 mg total) by mouth daily with breakfast. 90 tablet 3   Multiple Vitamin (MULTIVITAMIN WITH MINERALS) TABS tablet Take 1 tablet by mouth daily.     Omega-3 Fatty Acids (FISH OIL) 1000 MG CAPS Take 1,000 mg by mouth daily.      OnabotulinumtoxinA (BOTOX IJ) Inject 1 Dose as directed every 6 (six) weeks.      ondansetron (ZOFRAN ODT) 4 MG disintegrating tablet Take 1 tablet (4 mg total) by mouth every 8 (eight) hours as needed. 20 tablet 0   pantoprazole (PROTONIX) 40 MG tablet Take 1 tablet (40 mg total) by mouth daily. 90 tablet 1   PRESCRIPTION MEDICATION Inject into the skin every 6 (six) weeks. Steroid Trigger     Probiotic Product (PROBIOTIC-10 PO) Take 1 capsule by mouth daily.      rizatriptan (MAXALT) 10 MG tablet Take 1 tablet (10 mg total) by mouth once as needed for up to 10 days for migraine. May repeat in 2 hours if needed 10 tablet 0   triamcinolone (KENALOG) 0.1 % Apply to affected areas BID PRN for up to two weeks at a time as directed. 45 g 3   TURMERIC PO Take 1 tablet by mouth daily.     No current facility-administered medications for this visit.    PHYSICAL EXAMINATION: ECOG PERFORMANCE STATUS: 1 - Symptomatic but completely ambulatory  Vitals:   01/07/22 1140  BP: (!) 147/78  Pulse: 68  Resp: 17  Temp: (!) 97.5 F (36.4 C)  SpO2: 100%   Filed Weights   01/07/22 1140  Weight: 132 lb 14.4 oz (60.3 kg)      LABORATORY DATA:  I have reviewed the data  as listed CMP Latest Ref Rng & Units 01/05/2022 06/13/2021 10/28/2020  Glucose 70 - 99 mg/dL 81 98 -  BUN 8 - 23 mg/dL 12 14 -  Creatinine 0.44 - 1.00 mg/dL 0.71 0.74 -  Sodium 135 - 145 mmol/L 134(L) 137 -  Potassium 3.5 - 5.1 mmol/L 4.3 4.7 -  Chloride 98 - 111 mmol/L 98 101 -  CO2 22 - 32 mmol/L 29 29 -  Calcium 8.9 - 10.3 mg/dL 9.6 9.4 -  Total Protein 6.5 - 8.1 g/dL 7.3 6.7 7.5  Total Bilirubin 0.3 - 1.2 mg/dL 0.5 0.4 0.4  Alkaline Phos 38 - 126 U/L 92 116 123(H)  AST 15 - 41 U/L 16 15 16   ALT 0 - 44 U/L 16 18 16     Lab Results  Component Value Date   WBC 4.9 01/05/2022  HGB 13.0 01/05/2022   HCT 37.9 01/05/2022   MCV 90.0 01/05/2022   PLT 305 01/05/2022   NEUTROABS 2.5 01/05/2022    ASSESSMENT & PLAN:  Malignant neoplasm of upper-inner quadrant of right breast in female, estrogen receptor positive ([place]) 10/10/2018: Right lumpectomy: IDC grade 1, 1.2 cm, with intermediate grade DCIS, margins negative, 0/2 lymph nodes negative, T1CN0 stage Ia ER 100%, PR 90%, Ki-67 10%, HER-2 1+ negative Oncotype DX: 29: 18% risk of recurrence   Treatment plan: 1. Recommended systemic adjuvant chemotherapy with Taxotere and Cytoxan every 3 weeks x4 cycles (patient refused) 2. Adjuvant radiation therapy 11/08/2018-12/05/2018 3. Adjuvant antiestrogen therapy with anastrozole started 12/05/2018 switched to letrozole 10/09/19   Letrozole toxicities:myalgias     Iron deficiency anemia Etiology: Blood loss versus malabsorption August 2022: 3 doses of Venofer Follows with gastroenterology and has had endoscopies and colonoscopies. Lab review:  01/05/2022: Hemoglobin 13, MCV 90, 25% iron saturation, 94 ferritin I discussed with the patient that she does not need any IV iron at this time.   Recheck labs in 6 months and follow-up after that to discuss results.      No orders of the defined types were placed in this encounter.  The patient has a good understanding of the overall plan. she  agrees with it. she will call with any problems that may develop before the next visit here.  Total time spent: 20 mins including face to face time and time spent for planning, charting and coordination of care  Rulon Eisenmenger, MD, MPH 01/07/2022  I, Thana Ates, am acting as scribe for Dr. Nicholas Lose.  I have reviewed the above documentation for accuracy and completeness, and I agree with the above.

## 2022-01-07 ENCOUNTER — Inpatient Hospital Stay: Payer: Medicare HMO | Admitting: Hematology and Oncology

## 2022-01-07 ENCOUNTER — Other Ambulatory Visit: Payer: Self-pay

## 2022-01-07 DIAGNOSIS — G43719 Chronic migraine without aura, intractable, without status migrainosus: Secondary | ICD-10-CM | POA: Diagnosis not present

## 2022-01-07 DIAGNOSIS — G518 Other disorders of facial nerve: Secondary | ICD-10-CM | POA: Diagnosis not present

## 2022-01-07 DIAGNOSIS — C50211 Malignant neoplasm of upper-inner quadrant of right female breast: Secondary | ICD-10-CM | POA: Diagnosis not present

## 2022-01-07 DIAGNOSIS — M791 Myalgia, unspecified site: Secondary | ICD-10-CM | POA: Diagnosis not present

## 2022-01-07 DIAGNOSIS — G43019 Migraine without aura, intractable, without status migrainosus: Secondary | ICD-10-CM | POA: Diagnosis not present

## 2022-01-07 DIAGNOSIS — Z170 Estrogen receptor positive status [ER+]: Secondary | ICD-10-CM | POA: Diagnosis not present

## 2022-01-07 DIAGNOSIS — D509 Iron deficiency anemia, unspecified: Secondary | ICD-10-CM | POA: Diagnosis not present

## 2022-01-07 DIAGNOSIS — M542 Cervicalgia: Secondary | ICD-10-CM | POA: Diagnosis not present

## 2022-01-21 ENCOUNTER — Ambulatory Visit: Payer: BC Managed Care – PPO | Admitting: Obstetrics & Gynecology

## 2022-01-21 ENCOUNTER — Encounter: Payer: Self-pay | Admitting: Hematology and Oncology

## 2022-01-23 ENCOUNTER — Ambulatory Visit (INDEPENDENT_AMBULATORY_CARE_PROVIDER_SITE_OTHER): Payer: Medicare HMO | Admitting: Family Medicine

## 2022-01-23 ENCOUNTER — Encounter: Payer: Self-pay | Admitting: Family Medicine

## 2022-01-23 ENCOUNTER — Other Ambulatory Visit: Payer: Self-pay

## 2022-01-23 VITALS — BP 118/68 | HR 61 | Temp 97.9°F | Resp 16 | Ht 60.25 in | Wt 130.2 lb

## 2022-01-23 DIAGNOSIS — R220 Localized swelling, mass and lump, head: Secondary | ICD-10-CM | POA: Diagnosis not present

## 2022-01-23 DIAGNOSIS — H9202 Otalgia, left ear: Secondary | ICD-10-CM

## 2022-01-23 MED ORDER — PREDNISONE 10 MG PO TABS: ORAL_TABLET | ORAL | 0 refills | Status: DC

## 2022-01-23 NOTE — Patient Instructions (Addendum)
Take prednisone 20 mg daily for 5 days then 10 mg for 3 days  ? ?Use ice/cool compresses on swollen area  ?Follow up with your dentist if not significantly improved in a week ? ?If increased pain /swelling/redness -please alert [place] and the dentist  ? ? ? ? ? ? ?

## 2022-01-23 NOTE — Progress Notes (Signed)
Subjective:    Patient ID: Jennifer [name], female    DOB: 1956/06/28, 66 y.o.   MRN: [id number]  This visit occurred during the SARS-CoV-2 public health emergency.  Safety protocols were in place, including screening questions prior to the visit, additional usage of staff PPE, and extensive cleaning of exam room while observing appropriate contact time as indicated for disinfecting solutions.   HPI Pt presents with an ear issues   Wt Readings from Last 3 Encounters:  01/23/22 130 lb 3.2 oz (59.1 kg)  01/07/22 132 lb 14.4 oz (60.3 kg)  11/26/21 133 lb 4 oz (60.4 kg)   25.22 kg/m  L ear pain   Had a molar removed last month 01/01/22 Had a rough time healing/ a lot of discomfort  Had it re checked multiple times /she used a syringe to irrigate  Inflammation /swelling left side of her face  This caused migraines  Started herself on prednisone (5 mg daily for 3 days) -it did help  She does have a crown to replace near that L molar  The tooth is ok   Amoxicillin tid for 7d   Still some fullness under L mandible  No popping  Constant dull ear pain  No ringing  Unsure if hearing changes   No sinus symptoms   Has to avoid nsaids to prevent rebound headaches   Patient Active Problem List   Diagnosis Date Noted   Left facial swelling 01/25/2022   Left ear pain 01/23/2022   Acute cystitis with hematuria 11/26/2021   Right leg pain 10/15/2021   Microcytic anemia 07/07/2021   De Quervain's tenosynovitis, right 04/18/2021   Iron deficiency anemia 09/08/2020   Common bile duct dilatation 09/06/2020   Opacity of lung on imaging study 09/06/2020   Dizziness 07/10/2020   History of breast cancer 03/01/2020   Genetic testing 11/14/2019   Bilateral hand pain 11/03/2019   Family history of prostate cancer    Adnexal cyst 09/26/2019   Family history of breast cancer    Malignant neoplasm of upper-inner quadrant of right breast in female, estrogen receptor positive ([place]) 09/20/2018    B12 deficiency 04/13/2018   Joint pain 04/11/2018   Colon cancer screening 09/16/2017   Prediabetes 07/28/2016   Allergic rhinitis 11/13/2014   Screening for lipoid disorders 09/26/2014   Preventative health care 09/26/2014   Dysuria 08/07/2014   Routine general medical examination at a health care facility 10/28/2012   GERD 09/16/2010   Hypothyroidism 05/07/2008   Osteopenia 05/07/2008   Migraine with aura 11/11/2007   Past Medical History:  Diagnosis Date   Allergic rhinitis    Anxiety    Arthritis    hands, knees   Breast cancer ([place]) 2019   Right Breast Cancer   Cancer ([place]) 09/2018   Breast CA new DX, right breast   Cervical dysplasia    Common bile duct dilation    Diabetes mellitus without complication (HCC)    Endometriosis    Esophageal reflux    Family history of adverse reaction to anesthesia    younger sister anxiety for a dew days after   Family history of breast cancer    Family history of breast cancer    Family history of prostate cancer    Hepatic cyst 08/2020   multiple   Hepatic hemangioma 08/2020   intrahepatic hemangioma   Hiatal hernia    Hypertension    Hypothyroid    Migraine with aura, without mention of intractable migraine without  mention of status migrainosus    Osteopenia 08/2019   T score -2.2 FRAX 11% / 1.7%   Personal history of radiation therapy 2019   Right Breast Cancer   Pre-diabetes    Past Surgical History:  Procedure Laterality Date   BIOPSY  09/12/2020   Procedure: BIOPSY;  Surgeon: [name], MD;  Location: [facility];  Service: Endoscopy;;   BREAST EXCISIONAL BIOPSY Bilateral over 10 years ago   benign x 2    BREAST LUMPECTOMY Right 10/10/2018   BREAST LUMPECTOMY WITH RADIOACTIVE SEED AND SENTINEL LYMPH NODE BIOPSY Right 10/10/2018   Procedure: RIGHT BREAST LUMPECTOMY WITH RADIOACTIVE SEED AND RIGHT SENTINEL LYMPH NODE BIOPSY;  Surgeon: [name], MD;  Location: [facility];  Service:  General;  Laterality: Right;   BREAST SURGERY     Benign breast lump excised   CERVICAL CONE BIOPSY  1985   severe dysplasia   COLONOSCOPY  2005 and dec 2019   normal   COLPOSCOPY     endoscopy  10/2018   ESOPHAGOGASTRODUODENOSCOPY (EGD) WITH PROPOFOL N/A 09/12/2020   Procedure: ESOPHAGOGASTRODUODENOSCOPY (EGD) WITH PROPOFOL;  Surgeon: [name], MD;  Location: [facility];  Service: Endoscopy;  Laterality: N/A;   EUS N/A 09/12/2020   Procedure: UPPER ENDOSCOPIC ULTRASOUND (EUS) RADIAL;  Surgeon: [name], MD;  Location: [facility];  Service: Endoscopy;  Laterality: N/A;   LAPAROSCOPIC ASSISTED VAGINAL HYSTERECTOMY     LAPAROSCOPIC BILATERAL SALPINGO OOPHERECTOMY Bilateral 10/27/2019   Procedure: LAPAROSCOPIC BILATERAL SALPINGO OOPHORECTOMY WITH PERITONEAL WASHINGS;  Surgeon: Princess Bruins, MD;  Location: [facility];  Service: Gynecology;  Laterality: Bilateral;  request 1:00pm on Friday, Dec. 4th in [place] time held requests one hour OR time   moles removed from upper back, face lft, 1 between breasts, upper leg left inside  10/18/2019   wearing small round bandaids on for 2 weeks   ROTATOR CUFF REPAIR Bilateral 08/2008   UPPER GASTROINTESTINAL ENDOSCOPY     Social History   Tobacco Use   Smoking status: Never   Smokeless tobacco: Never  Vaping Use   Vaping Use: Never used  Substance Use Topics   Alcohol use: No    Alcohol/week: 0.0 standard drinks   Drug use: No   Family History  Problem Relation Age of Onset   Breast cancer Mother 9   Diabetes Mother    Hypertension Father    Heart disease Father    Hypertension Sister    Anuerysm Sister        head   Hypertension Sister    Anxiety disorder Sister    Other Sister        prediabetes   Osteoporosis Maternal Grandmother    Diabetes Maternal Grandmother    COPD Paternal Grandmother    Heart disease Paternal Grandfather    Breast cancer Cousin 61       pat first  cousin   Breast cancer Cousin        mat second cousin with breast cancer in her 30s   Thyroid cancer Cousin 29       pat first cousin   Prostate cancer Other 83   Colon cancer Neg Hx    Stomach cancer Neg Hx    Esophageal cancer Neg Hx    Pancreatic cancer Neg Hx    Rectal cancer Neg Hx    Allergies  Allergen Reactions   Ciprofloxacin Other (See Comments)    Dizziness  Codeine Phosphate Other (See Comments)    Dizziness, heart palpitations, nervous   Decongestant [Pseudoephedrine] Other (See Comments)    dizziness   Flonase [Fluticasone Propionate] Other (See Comments)    Worsens her migraine    Hydrocodone Other (See Comments)    Made nervous--10/09 surgery rotator cuff   Macrobid [Nitrofurantoin]     headaches   Nasonex [Mometasone]     Migraines   Reglan [Metoclopramide] Other (See Comments)    Dizziness   Topamax [Topiramate] Other (See Comments)    Dizziness    Benadryl [Diphenhydramine Hcl] Palpitations   Sulfa Antibiotics Anxiety   Current Outpatient Medications on File Prior to Visit  Medication Sig Dispense Refill   ALPRAZolam (XANAX) 0.5 MG tablet TAKE 1/2 TO 1 TABLET BY MOUTH ONCE DAILY AS NEEDED FOR SLEEP/ANXIETY 15 tablet 2   atenolol (TENORMIN) 25 MG tablet Take 12.5 mg by mouth 3 (three) times daily. For migraine prevention     baclofen (LIORESAL) 10 MG tablet Take by mouth.     CALCIUM CARBONATE-VITAMIN D PO Take 1 tablet by mouth daily. 1000 mg     famotidine (PEPCID) 40 MG tablet Take 40 mg by mouth 2 (two) times daily.      hydrocortisone 2.5 % cream Apply to affected areas BID PRN as directed. 30 g 3   hydrOXYzine (ATARAX/VISTARIL) 25 MG tablet Take 1 tablet (25 mg total) by mouth 3 (three) times daily as needed. (Patient taking differently: Take 25 mg by mouth 2 (two) times daily.) 30 tablet 0   ketorolac (TORADOL) 10 MG tablet Take 1 tablet (10 mg total) by mouth every 6 (six) hours as needed for moderate pain. 12 tablet 0   letrozole (FEMARA)  2.5 MG tablet TAKE 1 TABLET BY MOUTH EVERY DAY 90 tablet 3   levocetirizine (XYZAL) 5 MG tablet Take 5 mg by mouth at bedtime.   3   levothyroxine (SYNTHROID) 75 MCG tablet TAKE ONE TABLET BY MOUTH ONE TIME DAILY BEFORE BREAKFAST 90 tablet 1   Magnesium Oxide 400 (240 Mg) MG TABS Take 400 mg by mouth daily.      metFORMIN (GLUCOPHAGE XR) 500 MG 24 hr tablet Take 1 tablet (500 mg total) by mouth daily with breakfast. 90 tablet 3   Multiple Vitamin (MULTIVITAMIN WITH MINERALS) TABS tablet Take 1 tablet by mouth daily.     Omega-3 Fatty Acids (FISH OIL) 1000 MG CAPS Take 1,000 mg by mouth daily.      OnabotulinumtoxinA (BOTOX IJ) Inject 1 Dose as directed every 6 (six) weeks.      ondansetron (ZOFRAN ODT) 4 MG disintegrating tablet Take 1 tablet (4 mg total) by mouth every 8 (eight) hours as needed. 20 tablet 0   pantoprazole (PROTONIX) 40 MG tablet Take 1 tablet (40 mg total) by mouth daily. 90 tablet 1   PRESCRIPTION MEDICATION Inject into the skin every 6 (six) weeks. Steroid Trigger     Probiotic Product (PROBIOTIC-10 PO) Take 1 capsule by mouth daily.      rizatriptan (MAXALT) 10 MG tablet Take 1 tablet (10 mg total) by mouth once as needed for up to 10 days for migraine. May repeat in 2 hours if needed 10 tablet 0   triamcinolone (KENALOG) 0.1 % Apply to affected areas BID PRN for up to two weeks at a time as directed. 45 g 3   TURMERIC PO Take 1 tablet by mouth daily.     No current facility-administered medications on file prior to visit.  Review of Systems  Constitutional:  Negative for activity change, appetite change, fatigue, fever and unexpected weight change.  HENT:  Positive for dental problem, ear pain and facial swelling. Negative for congestion, drooling, ear discharge, hearing loss, postnasal drip, rhinorrhea, sinus pressure, sore throat, tinnitus, trouble swallowing and voice change.   Eyes:  Negative for pain, redness and visual disturbance.  Respiratory:  Negative for  cough, shortness of breath and wheezing.   Cardiovascular:  Negative for chest pain and palpitations.  Gastrointestinal:  Negative for abdominal pain, blood in stool, constipation and diarrhea.  Endocrine: Negative for polydipsia and polyuria.  Genitourinary:  Negative for dysuria, frequency and urgency.  Musculoskeletal:  Negative for arthralgias, back pain and myalgias.  Skin:  Negative for pallor and rash.  Allergic/Immunologic: Negative for environmental allergies.  Neurological:  Negative for dizziness, syncope and headaches.  Hematological:  Negative for adenopathy. Does not bruise/bleed easily.  Psychiatric/Behavioral:  Negative for decreased concentration and dysphoric mood. The patient is not nervous/anxious.       Objective:   Physical Exam Constitutional:      General: She is not in acute distress.    Appearance: Normal appearance. She is normal weight. She is not ill-appearing or diaphoretic.  HENT:     Head: Normocephalic and atraumatic.     Comments: Soft tissue swelling of L mandible area (around and under) No mass or abscess noted   No bony jaw tenderness     Right Ear: Tympanic membrane, ear canal and external ear normal. There is no impacted cerumen.     Left Ear: Tympanic membrane, ear canal and external ear normal. There is no impacted cerumen.     Nose: Nose normal. No congestion or rhinorrhea.     Mouth/Throat:     Mouth: Mucous membranes are moist.     Pharynx: No oropharyngeal exudate or posterior oropharyngeal erythema.     Comments: Site of L lower molar extraction looks normal  No tenderness Eyes:     General: No scleral icterus.       Right eye: No discharge.        Left eye: No discharge.     Extraocular Movements: Extraocular movements intact.     Conjunctiva/sclera: Conjunctivae normal.     Pupils: Pupils are equal, round, and reactive to light.  Neck:     Vascular: No carotid bruit.  Cardiovascular:     Rate and Rhythm: Normal rate and  regular rhythm.     Heart sounds: Normal heart sounds.  Pulmonary:     Effort: Pulmonary effort is normal. No respiratory distress.     Breath sounds: Normal breath sounds. No wheezing or rales.  Musculoskeletal:     Cervical back: Normal range of motion and neck supple.  Lymphadenopathy:     Cervical: No cervical adenopathy.  Skin:    General: Skin is warm and dry.     Coloration: Skin is not jaundiced.     Findings: No bruising, erythema or rash.  Neurological:     Mental Status: She is alert.     Cranial Nerves: No cranial nerve deficit.  Psychiatric:        Mood and Affect: Mood normal.          Assessment & Plan:   Problem List Items Addressed This Visit       Other   Left ear pain    Suspect this is referred pain from L facial swelling after dental procedure Will try prednisone and cold  compresses and f/u with dentist      Left facial swelling - Primary    This occurred after L molar removed almost a month ago Improved but not resolved Causing pain in L ear area Has followed up with dentist and taken amoxicillin  Disc use of cold compress Reassuring exam  Px prednisone taper 20 to 10 mg daily  Reviewed possible side effects  Monitor closely and f/u with dentist

## 2022-01-25 DIAGNOSIS — R220 Localized swelling, mass and lump, head: Secondary | ICD-10-CM | POA: Insufficient documentation

## 2022-01-25 NOTE — Assessment & Plan Note (Signed)
Suspect this is referred pain from L facial swelling after dental procedure ?Will try prednisone and cold compresses and f/u with dentist ?

## 2022-01-25 NOTE — Assessment & Plan Note (Signed)
This occurred after L molar removed almost a month ago ?Improved but not resolved ?Causing pain in L ear area ?Has followed up with dentist and taken amoxicillin  ?Disc use of cold compress ?Reassuring exam  ?Px prednisone taper 20 to 10 mg daily  ?Reviewed possible side effects  ?Monitor closely and f/u with dentist  ?

## 2022-01-27 ENCOUNTER — Encounter: Payer: Self-pay | Admitting: Hematology and Oncology

## 2022-01-28 ENCOUNTER — Other Ambulatory Visit: Payer: Self-pay

## 2022-01-28 ENCOUNTER — Encounter: Payer: Self-pay | Admitting: Obstetrics & Gynecology

## 2022-01-28 ENCOUNTER — Other Ambulatory Visit (HOSPITAL_COMMUNITY)
Admission: RE | Admit: 2022-01-28 | Discharge: 2022-01-28 | Disposition: A | Discharge status: Home / Self Care | Payer: Medicare HMO | Source: Ambulatory Visit | Attending: Obstetrics & Gynecology | Admitting: Obstetrics & Gynecology

## 2022-01-28 ENCOUNTER — Ambulatory Visit (INDEPENDENT_AMBULATORY_CARE_PROVIDER_SITE_OTHER): Payer: Medicare HMO | Admitting: Obstetrics & Gynecology

## 2022-01-28 VITALS — BP 114/64 | HR 72 | Resp 16 | Ht 60.0 in | Wt 129.0 lb

## 2022-01-28 DIAGNOSIS — M8580 Other specified disorders of bone density and structure, unspecified site: Secondary | ICD-10-CM

## 2022-01-28 DIAGNOSIS — Z853 Personal history of malignant neoplasm of breast: Secondary | ICD-10-CM | POA: Diagnosis not present

## 2022-01-28 DIAGNOSIS — Z124 Encounter for screening for malignant neoplasm of cervix: Secondary | ICD-10-CM

## 2022-01-28 DIAGNOSIS — Z01419 Encounter for gynecological examination (general) (routine) without abnormal findings: Secondary | ICD-10-CM

## 2022-01-28 DIAGNOSIS — Z780 Asymptomatic menopausal state: Secondary | ICD-10-CM

## 2022-01-28 DIAGNOSIS — Z9889 Other specified postprocedural states: Secondary | ICD-10-CM | POA: Diagnosis not present

## 2022-01-28 DIAGNOSIS — Z9189 Other specified personal risk factors, not elsewhere classified: Secondary | ICD-10-CM | POA: Diagnosis not present

## 2022-01-28 DIAGNOSIS — Z9289 Personal history of other medical treatment: Secondary | ICD-10-CM | POA: Diagnosis not present

## 2022-01-28 DIAGNOSIS — Z1151 Encounter for screening for human papillomavirus (HPV): Secondary | ICD-10-CM | POA: Diagnosis not present

## 2022-01-28 NOTE — Progress Notes (Signed)
? ? ?Jennifer [name] 1956/07/08 [id number] ? ? ?History:    66 y.o.  G0  ?  ?RP:  Established patient presenting for annual gyn exam  ?  ?HPI: S/P LPS BSO/Peritoneal washings 10/27/2019.  Postmenopause on no HRT.  No PMB.  No pelvic pain. Abstinent.  Pap 12/2020 Neg.  Remote h/o Cervical Conization per patient, pap normal thereafter.  Pap reflex today.  H/O Rt Breast invasive Ductal Ca grade 1 with ER positive.  Rt breast lumpectomy 09/2018 with Radioactive seed.  On Letrozole 2.5 mg daily currently, 1 year to go. Mammo Neg 09/2021.  BMI 25.97.  Health labs with Fam MD.  Harriet Masson 07/2021.  BD Osteopenia 09/2021 Rt Femoral Neck T-Score -2.4.  Frax not increased.  Continue Vit D, Ca++ 1.5 g/d total and regular weight bearing physical activities. ? ?Past medical history,surgical history, family history and social history were all reviewed and documented in the EPIC chart. ? ?Gynecologic History ?No LMP recorded. Patient has had a hysterectomy. ? ?Obstetric History ?OB History  ?Gravida Para Term Preterm AB Living  ?0 0 0 0 0 0  ?SAB IAB Ectopic Multiple Live Births  ?0 0 0 0 0  ? ? ? ?ROS: A ROS was performed and pertinent positives and negatives are included in the history. ? GENERAL: No fevers or chills. HEENT: No change in vision, no earache, sore throat or sinus congestion. NECK: No pain or stiffness. CARDIOVASCULAR: No chest pain or pressure. No palpitations. PULMONARY: No shortness of breath, cough or wheeze. GASTROINTESTINAL: No abdominal pain, nausea, vomiting or diarrhea, melena or bright red blood per rectum. GENITOURINARY: No urinary frequency, urgency, hesitancy or dysuria. MUSCULOSKELETAL: No joint or muscle pain, no back pain, no recent trauma. DERMATOLOGIC: No rash, no itching, no lesions. ENDOCRINE: No polyuria, polydipsia, no heat or cold intolerance. No recent change in weight. HEMATOLOGICAL: No anemia or easy bruising or bleeding. NEUROLOGIC: No headache, seizures, numbness, tingling or weakness. PSYCHIATRIC:  No depression, no loss of interest in normal activity or change in sleep pattern.  ?  ? ?Exam: ? ? ?BP 114/64   Pulse 72   Resp 16   Ht 5' (1.524 m)   Wt 129 lb (58.5 kg)   BMI 25.19 kg/m?  ? ?Body mass index is 25.19 kg/m?. ? ?General appearance : Well developed well nourished female. No acute distress ?HEENT: Eyes: no retinal hemorrhage or exudates,  Neck supple, trachea midline, no carotid bruits, no thyroidmegaly ?Lungs: Clear to auscultation, no rhonchi or wheezes, or rib retractions  ?Heart: Regular rate and rhythm, no murmurs or gallops ?Breast:Examined in sitting and supine position were symmetrical in appearance, no palpable masses or tenderness,  no skin retraction, no nipple inversion, no nipple discharge, no skin discoloration, no axillary or supraclavicular lymphadenopathy ?Abdomen: no palpable masses or tenderness, no rebound or guarding ?Extremities: no edema or skin discoloration or tenderness ? ?Pelvic: Vulva: Normal ?            Vagina: No gross lesions or discharge ? Cervix: No gross lesions or discharge.  Pap reflex done. ? Uterus  AV, normal size, shape and consistency, non-tender and mobile ? Adnexa  Without masses or tenderness ? Anus: Normal ? ? ?Assessment/Plan:  66 y.o. female for annual exam  ? ?1. Encounter for routine gynecological examination with Papanicolaou smear of cervix ?S/P LPS BSO/Peritoneal washings 10/27/2019.  Postmenopause on no HRT.  No PMB.  No pelvic pain. Abstinent.  Pap 12/2020 Neg.  Remote h/o Cervical Conization per  patient, pap normal thereafter.  Pap reflex today.  H/O Rt Breast invasive Ductal Ca grade 1 with ER positive.  Rt breast lumpectomy 09/2018 with Radioactive seed.  On Letrozole 2.5 mg daily currently, 1 year to go. Mammo Neg 09/2021.  BMI 25.97.  Health labs with Fam MD.  Harriet Masson 07/2021.  BD Osteopenia 09/2021 Rt Femoral Neck T-Score -2.4.  Frax not increased.  Continue Vit D, Ca++ 1.5 g/d total and regular weight bearing physical activities. ?-  Cytology - PAP( [place]) ? ?2. At risk for breast cancer ?Breast Ca Dxed 4 yrs ago. ? ?3. History of conization of cervix ?Pap done today. ?  ?4. Personal history of breast cancer (<5 yrs ago) ?H/O Rt Breast invasive Ductal Ca grade 1 with ER positive.  Rt breast lumpectomy 09/2018 with Radioactive seed.  On Letrozole 2.5 mg daily currently, 1 year to go. Mammo Neg 09/2021.  S/P BSO in 2020. ? ?5. Postmenopause ?S/P LPS BSO/Peritoneal washings 10/27/2019.  Postmenopause on no HRT.  No PMB.  No pelvic pain. Abstinent.  ? ?6. Osteopenia, unspecified location ?Counseling done on Osteopenia.  Management discussed.  BD Osteopenia 09/2021 Rt Femoral Neck T-Score -2.4.  Frax not increased.  Continue Vit D, Ca++ 1.5 g/d total and regular weight bearing physical activities. ? ?7. Personal history of other medical treatment ? ?Other orders ?- B Complex Vitamins (B COMPLEX PO); Take by mouth.  ? ?Princess Bruins MD, 11:35 AM 01/28/2022 ? ?  ?

## 2022-01-30 LAB — CYTOLOGY - PAP
Comment: NEGATIVE
Diagnosis: NEGATIVE
High risk HPV: NEGATIVE

## 2022-02-12 DIAGNOSIS — G43719 Chronic migraine without aura, intractable, without status migrainosus: Secondary | ICD-10-CM | POA: Diagnosis not present

## 2022-02-12 DIAGNOSIS — M542 Cervicalgia: Secondary | ICD-10-CM | POA: Diagnosis not present

## 2022-02-12 DIAGNOSIS — G518 Other disorders of facial nerve: Secondary | ICD-10-CM | POA: Diagnosis not present

## 2022-02-12 DIAGNOSIS — G43019 Migraine without aura, intractable, without status migrainosus: Secondary | ICD-10-CM | POA: Diagnosis not present

## 2022-02-12 DIAGNOSIS — M791 Myalgia, unspecified site: Secondary | ICD-10-CM | POA: Diagnosis not present

## 2022-02-21 ENCOUNTER — Emergency Department
Admission: EM | Admit: 2022-02-21 | Discharge: 2022-02-21 | Disposition: A | Discharge status: Home / Self Care | Payer: Medicare HMO | Attending: Emergency Medicine | Admitting: Emergency Medicine

## 2022-02-21 ENCOUNTER — Encounter: Payer: Self-pay | Admitting: Intensive Care

## 2022-02-21 ENCOUNTER — Other Ambulatory Visit: Payer: Self-pay

## 2022-02-21 DIAGNOSIS — I10 Essential (primary) hypertension: Secondary | ICD-10-CM | POA: Diagnosis not present

## 2022-02-21 DIAGNOSIS — R519 Headache, unspecified: Secondary | ICD-10-CM | POA: Diagnosis present

## 2022-02-21 DIAGNOSIS — G43801 Other migraine, not intractable, with status migrainosus: Secondary | ICD-10-CM | POA: Insufficient documentation

## 2022-02-21 MED ORDER — MAGNESIUM SULFATE 2 GM/50ML IV SOLN
2.0000 g | Freq: Once | INTRAVENOUS | Status: AC
  Administered 2022-02-21: 2 g via INTRAVENOUS
  Filled 2022-02-21: qty 50

## 2022-02-21 MED ORDER — KETOROLAC TROMETHAMINE 30 MG/ML IJ SOLN
30.0000 mg | Freq: Once | INTRAMUSCULAR | Status: AC
  Administered 2022-02-21: 30 mg via INTRAVENOUS
  Filled 2022-02-21: qty 1

## 2022-02-21 MED ORDER — METHYLPREDNISOLONE SODIUM SUCC 125 MG IJ SOLR
125.0000 mg | Freq: Once | INTRAMUSCULAR | Status: AC
  Administered 2022-02-21: 125 mg via INTRAVENOUS
  Filled 2022-02-21: qty 2

## 2022-02-21 MED ORDER — PREDNISONE 20 MG PO TABS: 60.0000 mg | ORAL_TABLET | Freq: Every day | ORAL | 0 refills | Status: AC

## 2022-02-21 MED ORDER — SODIUM CHLORIDE 0.9 % IV BOLUS
1000.0000 mL | Freq: Once | INTRAVENOUS | Status: AC
  Administered 2022-02-21: 1000 mL via INTRAVENOUS

## 2022-02-21 NOTE — ED Provider Notes (Signed)
? ?[facility] ?Provider Note ? ? Event Date/Time  ? First MD Initiated Contact with Patient 02/21/22 1320   ?  (approximate) ?History  ?Migraine ? ?HPI ?Jennifer [name] is a 66 y.o. female with a stated past medical history of hypertension and migraines who presents for signs and symptoms similar to previous migraines that she has had in the past including 6-10/10 aching global headache that is associated with nausea and has been present for the last 5 days.  Patient states that she has tried taking sumatriptan and diclofenac for the symptoms and they have helped but symptoms recur.  Patient states that she talked to her neurologist yesterday who told her to present to the emergency department if symptoms were worsening as they were not open on the weekends.  Patient denies any weakness/numbness/paresthesias in any extremity, difficulty swallowing, chest pain, shortness of breath, vomiting, diarrhea, recent travel, or sick contacts ?Physical Exam  ?Triage Vital Signs: ?ED Triage Vitals  ?Enc Vitals Group  ?   BP 02/21/22 1319 125/74  ?   Pulse Rate 02/21/22 1319 66  ?   Resp 02/21/22 1319 16  ?   Temp 02/21/22 1319 99 ?F (37.2 ?C)  ?   Temp Source 02/21/22 1319 Oral  ?   SpO2 02/21/22 1319 98 %  ?   Weight 02/21/22 1314 130 lb (59 kg)  ?   Height 02/21/22 1314 '5\' 1"'$  (1.549 m)  ?   Head Circumference --   ?   Peak Flow --   ?   Pain Score 02/21/22 1314 4  ?   Pain Loc --   ?   Pain Edu? --   ?   Excl. in [place]? --   ? ?Most recent vital signs: ?Vitals:  ? 02/21/22 1319  ?BP: 125/74  ?Pulse: 66  ?Resp: 16  ?Temp: 99 ?F (37.2 ?C)  ?SpO2: 98%  ? ?General: Awake, oriented x4. ?CV:  Good peripheral perfusion.  ?Resp:  Normal effort.  ?Abd:  No distention.  ?Other:  Elderly Caucasian female laying in bed in mild distress secondary to pain holding her head.  No meningismus.  NIHSS 0 ?ED Results / Procedures / Treatments  ? ?PROCEDURES: ?Critical Care performed: No ?Procedures ?MEDICATIONS ORDERED IN  ED: ?Medications  ?sodium chloride 0.9 % bolus 1,000 mL (0 mLs Intravenous Stopped 02/21/22 1456)  ?ketorolac (TORADOL) 30 MG/ML injection 30 mg (30 mg Intravenous Given 02/21/22 1348)  ?methylPREDNISolone sodium succinate (SOLU-MEDROL) 125 mg/2 mL injection 125 mg (125 mg Intravenous Given 02/21/22 1348)  ?magnesium sulfate IVPB 2 g 50 mL (0 g Intravenous Stopped 02/21/22 1459)  ? ?IMPRESSION / MDM / ASSESSMENT AND PLAN / ED COURSE  ?I reviewed the triage vital signs and the nursing notes. ?             ?               ? ?Presents with Headache. ? ?No focal neurological symptoms. Neuro exam is benign. Pt is nontoxic. VSS. ? ?Based on history and normal neurological exam I have low suspicion for intracranial tumor, intracranial bleed, meningitis, temporal arteritis, glaucoma, CO poisoning. ? ?Most likely patient has benign headache, recommend rest, hydration, and ibuprofen. ? ?Disposition: Discharge home with strict return precautions and instructions for prompt primary care follow up. ? ?  ?FINAL CLINICAL IMPRESSION(S) / ED DIAGNOSES  ? ?Final diagnoses:  ?Other migraine with status migrainosus, not intractable  ? ?Rx / DC Orders  ? ?ED  Discharge Orders   ? ?      Ordered  ?  predniSONE (DELTASONE) 20 MG tablet  Daily with breakfast       ? 02/21/22 1604  ? ?  ?  ? ?  ? ?Note:  This document was prepared using Dragon voice recognition software and may include unintentional dictation errors. ?  ?Naaman Plummer, MD ?02/21/22 1623 ? ?

## 2022-02-21 NOTE — ED Triage Notes (Signed)
Patient c/o migraine that started on Monday. Reports hx of migraines and feels the same. Reports relief with medication but then starts up again ?

## 2022-02-21 NOTE — Discharge Instructions (Addendum)
Proper magnesium supplementation for migraine prevention is 400-600 mg/day ?

## 2022-03-13 ENCOUNTER — Ambulatory Visit: Payer: Medicare HMO | Admitting: Family Medicine

## 2022-03-16 DIAGNOSIS — M542 Cervicalgia: Secondary | ICD-10-CM | POA: Diagnosis not present

## 2022-03-16 DIAGNOSIS — G43719 Chronic migraine without aura, intractable, without status migrainosus: Secondary | ICD-10-CM | POA: Diagnosis not present

## 2022-03-16 DIAGNOSIS — M791 Myalgia, unspecified site: Secondary | ICD-10-CM | POA: Diagnosis not present

## 2022-03-16 DIAGNOSIS — G518 Other disorders of facial nerve: Secondary | ICD-10-CM | POA: Diagnosis not present

## 2022-03-16 DIAGNOSIS — G43019 Migraine without aura, intractable, without status migrainosus: Secondary | ICD-10-CM | POA: Diagnosis not present

## 2022-03-17 ENCOUNTER — Ambulatory Visit (INDEPENDENT_AMBULATORY_CARE_PROVIDER_SITE_OTHER): Payer: Medicare HMO | Admitting: Family Medicine

## 2022-03-17 ENCOUNTER — Encounter: Payer: Self-pay | Admitting: Family Medicine

## 2022-03-17 ENCOUNTER — Other Ambulatory Visit: Payer: Self-pay | Admitting: Family Medicine

## 2022-03-17 DIAGNOSIS — I7300 Raynaud's syndrome without gangrene: Secondary | ICD-10-CM

## 2022-03-17 NOTE — Progress Notes (Signed)
? ?Subjective:  ? ? Patient ID: Jennifer [name], female    DOB: 1956-05-18, 66 y.o.   MRN: [id number] ? ?HPI ?Pt presents for cold fingers that turn white  ? ?Wt Readings from Last 3 Encounters:  ?03/17/22 132 lb 8 oz (60.1 kg)  ?02/21/22 130 lb (59 kg)  ?01/28/22 129 lb (58.5 kg)  ? ?25.04 kg/m? ? ? ?Concerned that she has raynaud's  ?Last year noted finger tips getting white in a few finger ?Mostly L 2,3rd and R index they turn white  ?Not blue now-few episodes last year with blue color  ?They feel numb ish /not painful  ?Then red when they re perfuse  ? ?? Family history  ?Mother has cold hands but not this ?Some distant h/o lupus  ? ?10-15 minutes to warm back up  ? ?Does not happen to feet  ?Has cold feet in general  ? ? ?Is generally cold natured but has some hot flashes also  ? ? ?Triggered by getting cold  ? ? 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 ?Really struggling with migraines in the past month  ?Triggered by some food/ice cream and almond butter ?Had trigger shots yesterday  ? ? ?BP Readings from Last 3 Encounters:  ?03/17/22 124/76  ?02/21/22 125/74  ?01/28/22 114/64  ? ?Pulse Readings from Last 3 Encounters:  ?03/17/22 (!) 58  ?02/21/22 66  ?01/28/22 72  ? ? ?Lab Results  ?Component Value Date  ? ANA NEGATIVE 04/12/2018  ? RF <14 04/12/2018  ? ?Lab Results  ?Component Value Date  ? WBC 4.9 01/05/2022  ? HGB 13.0 01/05/2022  ? HCT 37.9 01/05/2022  ? MCV 90.0 01/05/2022  ? PLT 305 01/05/2022  ? ?Lab Results  ?Component Value Date  ? ESRSEDRATE 15 04/12/2018  ? ? ?No personal h/o auto immune joint dz (has OA) ?No fevers ?No rashes  ? ?Patient Active Problem List  ? Diagnosis Date Noted  ? Left facial swelling 01/25/2022  ? Left ear pain 01/23/2022  ? Acute cystitis with hematuria 11/26/2021  ? Right leg pain 10/15/2021  ? Microcytic anemia 07/07/2021  ? De Quervain's tenosynovitis, right 04/18/2021  ? Iron deficiency anemia 09/08/2020  ? Common bile duct dilatation 09/06/2020  ? Opacity of lung on  imaging study 09/06/2020  ? Dizziness 07/10/2020  ? History of breast cancer 03/01/2020  ? Genetic testing 11/14/2019  ? Bilateral hand pain 11/03/2019  ? Family history of prostate cancer   ? Adnexal cyst 09/26/2019  ? Family history of breast cancer   ? Malignant neoplasm of upper-inner quadrant of right breast in female, estrogen receptor positive ([place]) 09/20/2018  ? B12 deficiency 04/13/2018  ? Joint pain 04/11/2018  ? Colon cancer screening 09/16/2017  ? Prediabetes 07/28/2016  ? Allergic rhinitis 11/13/2014  ? Screening for lipoid disorders 09/26/2014  ? Preventative health care 09/26/2014  ? Dysuria 08/07/2014  ? Routine general medical examination at a health care facility 10/28/2012  ? GERD 09/16/2010  ? Hypothyroidism 05/07/2008  ? Osteopenia 05/07/2008  ? Migraine with aura 11/11/2007  ? ?Past Medical History:  ?Diagnosis Date  ? Allergic rhinitis   ? Anxiety   ? Arthritis   ? hands, knees  ? Breast cancer ([place]) 2019  ? Right Breast Cancer  ? Cancer ([place]) 09/2018  ? Breast CA new DX, right breast  ? Cervical dysplasia   ? Common bile duct dilation   ? Diabetes mellitus without complication ([place])   ? Endometriosis   ?  Esophageal reflux   ? Family history of adverse reaction to anesthesia   ? younger sister anxiety for a dew days after  ? Family history of breast cancer   ? Family history of breast cancer   ? Family history of prostate cancer   ? Hepatic cyst 08/2020  ? multiple  ? Hepatic hemangioma 08/2020  ? intrahepatic hemangioma  ? Hiatal hernia   ? Hypertension   ? Hypothyroid   ? Migraine with aura, without mention of intractable migraine without mention of status migrainosus   ? Osteopenia 08/2019  ? T score -2.2 FRAX 11% / 1.7%  ? Personal history of radiation therapy 2019  ? Right Breast Cancer  ? Pre-diabetes   ? ?Past Surgical History:  ?Procedure Laterality Date  ? BIOPSY  09/12/2020  ? Procedure: BIOPSY;  Surgeon: [name], MD;  Location: [facility];  Service: Endoscopy;;  ? BREAST  EXCISIONAL BIOPSY Bilateral over 10 years ago  ? benign x 2   ? BREAST LUMPECTOMY Right 10/10/2018  ? BREAST LUMPECTOMY WITH RADIOACTIVE SEED AND SENTINEL LYMPH NODE BIOPSY Right 10/10/2018  ? Procedure: RIGHT BREAST LUMPECTOMY WITH RADIOACTIVE SEED AND RIGHT SENTINEL LYMPH NODE BIOPSY;  Surgeon: Jovita Kussmaul, MD;  Location: [facility];  Service: General;  Laterality: Right;  ? BREAST SURGERY    ? Benign breast lump excised  ? CERVICAL CONE BIOPSY  1985  ? severe dysplasia  ? COLONOSCOPY  2005 and dec 2019  ? normal  ? COLPOSCOPY    ? endoscopy  10/2018  ? ESOPHAGOGASTRODUODENOSCOPY (EGD) WITH PROPOFOL N/A 09/12/2020  ? Procedure: ESOPHAGOGASTRODUODENOSCOPY (EGD) WITH PROPOFOL;  Surgeon: [name], MD;  Location: [facility];  Service: Endoscopy;  Laterality: N/A;  ? EUS N/A 09/12/2020  ? Procedure: UPPER ENDOSCOPIC ULTRASOUND (EUS) RADIAL;  Surgeon: [name], MD;  Location: [facility];  Service: Endoscopy;  Laterality: N/A;  ? LAPAROSCOPIC ASSISTED VAGINAL HYSTERECTOMY    ? LAPAROSCOPIC BILATERAL SALPINGO OOPHERECTOMY Bilateral 10/27/2019  ? Procedure: LAPAROSCOPIC BILATERAL SALPINGO OOPHORECTOMY WITH PERITONEAL WASHINGS;  Surgeon: Princess Bruins, MD;  Location: [facility];  Service: Gynecology;  Laterality: Bilateral;  request 1:00pm on Friday, Dec. 4th in [place] time held ?requests one hour OR time  ? moles removed from upper back, face lft, 1 between breasts, upper leg left inside  10/18/2019  ? wearing small round bandaids on for 2 weeks  ? ROTATOR CUFF REPAIR Bilateral 08/2008  ? UPPER GASTROINTESTINAL ENDOSCOPY    ? ?Social History  ? ?Tobacco Use  ? Smoking status: Never  ? Smokeless tobacco: Never  ?Vaping Use  ? Vaping Use: Never used  ?Substance Use Topics  ? Alcohol use: No  ?  Alcohol/week: 0.0 standard drinks  ? Drug use: No  ? ?Family History  ?Problem Relation Age of Onset  ? Breast cancer Mother 39  ? Diabetes Mother   ?  Hypertension Father   ? Heart disease Father   ? Hypertension Sister   ? Anuerysm Sister   ?     head  ? Hypertension Sister   ? Anxiety disorder Sister   ? Other Sister   ?     prediabetes  ? Osteoporosis Maternal Grandmother   ? Diabetes Maternal Grandmother   ? COPD Paternal Grandmother   ? Heart disease Paternal Grandfather   ? Breast cancer Cousin 31  ?     pat first cousin  ? Breast cancer Cousin   ?  mat second cousin with breast cancer in her 80s  ? Thyroid cancer Cousin 46  ?     pat first cousin  ? Prostate cancer Other 81  ? Colon cancer Neg Hx   ? Stomach cancer Neg Hx   ? Esophageal cancer Neg Hx   ? Pancreatic cancer Neg Hx   ? Rectal cancer Neg Hx   ? ?Allergies  ?Allergen Reactions  ? Ciprofloxacin Other (See Comments)  ?  Dizziness ?  ? Codeine Phosphate Other (See Comments)  ?  Dizziness, heart palpitations, nervous  ? Decongestant [Pseudoephedrine] Other (See Comments)  ?  dizziness  ? Flonase [Fluticasone Propionate] Other (See Comments)  ?  Worsens her migraine   ? Hydrocodone Other (See Comments)  ?  Made nervous--10/09 surgery rotator cuff  ? Macrobid [Nitrofurantoin]   ?  headaches  ? Nasonex [Mometasone]   ?  Migraines  ? Reglan [Metoclopramide] Other (See Comments)  ?  Dizziness  ? Topamax [Topiramate] Other (See Comments)  ?  Dizziness ?  ? Benadryl [Diphenhydramine Hcl] Palpitations  ? Sulfa Antibiotics Anxiety  ? ?Current Outpatient Medications on File Prior to Visit  ?Medication Sig Dispense Refill  ? ALPRAZolam (XANAX) 0.5 MG tablet TAKE 1/2 TO 1 TABLET BY MOUTH ONCE DAILY AS NEEDED FOR SLEEP/ANXIETY 15 tablet 2  ? atenolol (TENORMIN) 25 MG tablet Take 12.5 mg by mouth 3 (three) times daily. For migraine prevention    ? B Complex Vitamins (B COMPLEX PO) Take by mouth.    ? baclofen (LIORESAL) 10 MG tablet Take by mouth.    ? CALCIUM CARBONATE-VITAMIN D PO Take 1 tablet by mouth daily. 1000 mg    ? famotidine (PEPCID) 40 MG tablet Take 40 mg by mouth 2 (two) times daily.     ?  hydrocortisone 2.5 % cream Apply to affected areas BID PRN as directed. 30 g 3  ? hydrOXYzine (ATARAX/VISTARIL) 25 MG tablet Take 1 tablet (25 mg total) by mouth 3 (three) times daily as needed. (Patient taking differen

## 2022-03-17 NOTE — Assessment & Plan Note (Signed)
No ulceration or permanent skin changes, nl exam today  ?Suspect primary (have ruled out autoimm jt dz in the past) ?Adv to keep hands warm ?Handout given  ?Disc implications/what to watch for ?Will continue to follow ?No medication needed currently  ?

## 2022-03-17 NOTE — Patient Instructions (Signed)
Keep your hands warm and keep watching them  ?Warm water helps  ?Protect from cold objects  ? ?Cover cups with something if cold  ?Carry gloves with you in case you get cold hands  ?Avoid air conditioning pointed at your hands in the car  ? ?Continue to avoid caffeine  ? ?Exercise helps also  ? ?If symptoms worsen let us know  ? ?If you develop swollen/red joints or fevers or rashes let us know  ? ? ?

## 2022-03-31 ENCOUNTER — Encounter: Payer: Self-pay | Admitting: Family Medicine

## 2022-03-31 MED ORDER — ALPRAZOLAM 0.5 MG PO TABS: ORAL_TABLET | ORAL | 3 refills | Status: DC

## 2022-03-31 NOTE — Telephone Encounter (Signed)
Name of Medication: Xanax ?Name of Pharmacy: [facility] ?Last Fill or Written Date and Quantity: 12/22/21  #15 tab/ 2 refills ?Last Office Visit and Type: Hand issues on 03/17/22 ?Next Office Visit and Type: none scheduled ?

## 2022-04-15 ENCOUNTER — Encounter: Payer: Self-pay | Admitting: Hematology and Oncology

## 2022-04-19 DIAGNOSIS — G43719 Chronic migraine without aura, intractable, without status migrainosus: Secondary | ICD-10-CM | POA: Diagnosis not present

## 2022-04-20 DIAGNOSIS — Z853 Personal history of malignant neoplasm of breast: Secondary | ICD-10-CM | POA: Diagnosis not present

## 2022-04-20 DIAGNOSIS — G43909 Migraine, unspecified, not intractable, without status migrainosus: Secondary | ICD-10-CM | POA: Diagnosis not present

## 2022-05-02 DIAGNOSIS — G43909 Migraine, unspecified, not intractable, without status migrainosus: Secondary | ICD-10-CM | POA: Diagnosis not present

## 2022-05-12 DIAGNOSIS — G43719 Chronic migraine without aura, intractable, without status migrainosus: Secondary | ICD-10-CM | POA: Diagnosis not present

## 2022-05-12 DIAGNOSIS — M791 Myalgia, unspecified site: Secondary | ICD-10-CM | POA: Diagnosis not present

## 2022-05-12 DIAGNOSIS — M542 Cervicalgia: Secondary | ICD-10-CM | POA: Diagnosis not present

## 2022-05-12 DIAGNOSIS — G43019 Migraine without aura, intractable, without status migrainosus: Secondary | ICD-10-CM | POA: Diagnosis not present

## 2022-05-12 DIAGNOSIS — G518 Other disorders of facial nerve: Secondary | ICD-10-CM | POA: Diagnosis not present

## 2022-05-23 ENCOUNTER — Other Ambulatory Visit: Payer: Self-pay

## 2022-05-23 ENCOUNTER — Emergency Department
Admission: EM | Admit: 2022-05-23 | Discharge: 2022-05-23 | Disposition: A | Discharge status: Home / Self Care | Payer: Medicare HMO | Attending: Emergency Medicine | Admitting: Emergency Medicine

## 2022-05-23 ENCOUNTER — Encounter: Payer: Self-pay | Admitting: Emergency Medicine

## 2022-05-23 DIAGNOSIS — E039 Hypothyroidism, unspecified: Secondary | ICD-10-CM | POA: Insufficient documentation

## 2022-05-23 DIAGNOSIS — G43001 Migraine without aura, not intractable, with status migrainosus: Secondary | ICD-10-CM | POA: Diagnosis not present

## 2022-05-23 DIAGNOSIS — G43909 Migraine, unspecified, not intractable, without status migrainosus: Secondary | ICD-10-CM | POA: Diagnosis present

## 2022-05-23 MED ORDER — KETOROLAC TROMETHAMINE 30 MG/ML IJ SOLN
30.0000 mg | Freq: Once | INTRAMUSCULAR | Status: AC
  Administered 2022-05-23: 30 mg via INTRAVENOUS
  Filled 2022-05-23: qty 1

## 2022-05-23 MED ORDER — SODIUM CHLORIDE 0.9 % IV SOLN
12.5000 mg | Freq: Once | INTRAVENOUS | Status: DC
  Filled 2022-05-23: qty 0.5

## 2022-05-23 MED ORDER — SODIUM CHLORIDE 0.9 % IV BOLUS
1000.0000 mL | Freq: Once | INTRAVENOUS | Status: AC
  Administered 2022-05-23: 1000 mL via INTRAVENOUS

## 2022-05-23 MED ORDER — ONDANSETRON HCL 4 MG/2ML IJ SOLN
4.0000 mg | Freq: Once | INTRAMUSCULAR | Status: AC
Start: 2022-05-23 — End: 2022-05-23
  Administered 2022-05-23: 4 mg via INTRAVENOUS
  Filled 2022-05-23: qty 2

## 2022-05-23 MED ORDER — METHYLPREDNISOLONE SODIUM SUCC 125 MG IJ SOLR
125.0000 mg | Freq: Once | INTRAMUSCULAR | Status: AC
  Administered 2022-05-23: 125 mg via INTRAVENOUS
  Filled 2022-05-23: qty 2

## 2022-05-23 MED ORDER — PREDNISONE 20 MG PO TABS: 60.0000 mg | ORAL_TABLET | Freq: Every day | ORAL | 0 refills | Status: DC

## 2022-05-23 NOTE — Discharge Instructions (Signed)
You were seen today for migraine.  You were treated with IV fluids and a migraine cocktail.  I am given your prescription for prednisone 60 mg to take daily for the next 3 days.  Please follow-up with your PCP or neurology as an outpatient.

## 2022-05-23 NOTE — ED Notes (Signed)
See triage note. Migraine since 5 days, hx of same. Provider saw pt at bedside, see new orders.

## 2022-05-23 NOTE — ED Provider Notes (Signed)
[facility] Provider Note    Event Date/Time   First MD Initiated Contact with Patient 05/23/22 1031     (approximate)   History   Migraine (/)   HPI  Jennifer [name] is a 66 y.o. female with a history of hypothyroidism, migraines, iron deficiency anemia and prediabetes presents to the ER today with complaint of a migraine.  She reports this started 5 days ago.  The headache is located to the right side of her head.  She describes the pain as sharp.  She reports associated soreness of her scalp, sensitivity to light, sound and smells.  She denies dizziness, vision changes, nausea or vomiting.  She reports these are similar to her migraines that she has had in the past.  She has taken Toradol and Maxalt OTC with minimal relief of symptoms.  She does follow with neurology.      Physical Exam   Triage Vital Signs: ED Triage Vitals  Enc Vitals Group     BP 05/23/22 1019 (!) 143/67     Pulse Rate 05/23/22 1019 (!) 58     Resp 05/23/22 1019 18     Temp 05/23/22 1019 98.2 F (36.8 C)     Temp src --      SpO2 05/23/22 1019 98 %     Weight 05/23/22 1017 130 lb (59 kg)     Height 05/23/22 1017 '5\' 1"'$  (1.549 m)     Head Circumference --      Peak Flow --      Pain Score 05/23/22 1017 3     Pain Loc --      Pain Edu? --      Excl. in [place]? --     Most recent vital signs: Vitals:   05/23/22 1019  BP: (!) 143/67  Pulse: (!) 58  Resp: 18  Temp: 98.2 F (36.8 C)  SpO2: 98%    General: Awake, appears uncomfortable but in NAD. ENT:  PERRLA, EOMs intact. CV:  Bradycardic with normal rhythm. Resp:  Normal effort.  Neuro:  Alert and oriented.  Coordination normal.    ED Results / Procedures / Treatments    MEDICATIONS ORDERED IN ED: Medications  sodium chloride 0.9 % bolus 1,000 mL (1,000 mLs Intravenous New Bag/Given 05/23/22 1054)  methylPREDNISolone sodium succinate (SOLU-MEDROL) 125 mg/2 mL injection 125 mg (125 mg Intravenous Given 05/23/22 1055)   ketorolac (TORADOL) 30 MG/ML injection 30 mg (30 mg Intravenous Given 05/23/22 1055)  ondansetron (ZOFRAN) injection 4 mg (4 mg Intravenous Given 05/23/22 1100)     IMPRESSION / MDM / ASSESSMENT AND PLAN / ED COURSE  I reviewed the triage vital signs and the nursing notes.  Migraine:  Differential diagnosis includes, but is not limited to, migraine with status migrainosus  Patient's presentation is most consistent with exacerbation of chronic illness.  NS 1000 ml bolus given Solu Medrol 125 mg IV x 1 Toradol 30 mg IV x1 Zofran 4 mg IV x 1 Symptoms have improved, she is agreeable to discharge and follow-up with neurology as an outpatient She is requesting a Rx for Prednisone 60 mg x3 days which she typically takes after being seen in the ER to prevent a rebound headache She will continue her home Toradol and Maxalt as previously prescribed   FINAL CLINICAL IMPRESSION(S) / ED DIAGNOSES   Final diagnoses:  Migraine without aura and with status migrainosus, not intractable     Rx / DC Orders   ED  Discharge Orders          Ordered    predniSONE (DELTASONE) 20 MG tablet  Daily with breakfast        05/23/22 1156             Note:  This document was prepared using Dragon voice recognition software and may include unintentional dictation errors.    Jearld Fenton, NP 05/23/22 1157    Naaman Plummer, MD 05/23/22 1440

## 2022-05-23 NOTE — ED Triage Notes (Signed)
Pt via POV from home. Pt states she is here for migraine pt states she has been here multiple times for same. Pt has a hx of migraines. States that this one has been going on for 5 days. Prescription medications has no relief. Pt is A&Ox4 and NAD

## 2022-05-30 ENCOUNTER — Encounter: Payer: Self-pay | Admitting: Family Medicine

## 2022-06-01 MED ORDER — ALPRAZOLAM 0.5 MG PO TABS: ORAL_TABLET | ORAL | 0 refills | Status: DC

## 2022-06-01 NOTE — Telephone Encounter (Signed)
LOV - 03/17/22 NOV - not scheduled RF - 03/31/22 #15/3

## 2022-06-04 DIAGNOSIS — M542 Cervicalgia: Secondary | ICD-10-CM | POA: Diagnosis not present

## 2022-06-04 DIAGNOSIS — M791 Myalgia, unspecified site: Secondary | ICD-10-CM | POA: Diagnosis not present

## 2022-06-04 DIAGNOSIS — G43019 Migraine without aura, intractable, without status migrainosus: Secondary | ICD-10-CM | POA: Diagnosis not present

## 2022-06-04 DIAGNOSIS — G43719 Chronic migraine without aura, intractable, without status migrainosus: Secondary | ICD-10-CM | POA: Diagnosis not present

## 2022-06-04 DIAGNOSIS — G518 Other disorders of facial nerve: Secondary | ICD-10-CM | POA: Diagnosis not present

## 2022-06-05 MED ORDER — ALPRAZOLAM 0.5 MG PO TABS: ORAL_TABLET | ORAL | 3 refills | Status: DC

## 2022-06-05 NOTE — Telephone Encounter (Signed)
Refill didn't go through to Pharmacy. Please advise.

## 2022-06-05 NOTE — Addendum Note (Signed)
Addended by: Loura Pardon A on: 06/05/2022 04:48 PM   Modules accepted: Orders

## 2022-06-07 ENCOUNTER — Other Ambulatory Visit: Payer: Self-pay

## 2022-06-07 ENCOUNTER — Encounter: Payer: Self-pay | Admitting: Emergency Medicine

## 2022-06-07 ENCOUNTER — Emergency Department
Admission: EM | Admit: 2022-06-07 | Discharge: 2022-06-07 | Disposition: A | Discharge status: Home / Self Care | Payer: Medicare HMO | Attending: Emergency Medicine | Admitting: Emergency Medicine

## 2022-06-07 ENCOUNTER — Emergency Department: Payer: Medicare HMO

## 2022-06-07 DIAGNOSIS — G43909 Migraine, unspecified, not intractable, without status migrainosus: Secondary | ICD-10-CM | POA: Diagnosis not present

## 2022-06-07 DIAGNOSIS — E039 Hypothyroidism, unspecified: Secondary | ICD-10-CM | POA: Diagnosis not present

## 2022-06-07 DIAGNOSIS — E119 Type 2 diabetes mellitus without complications: Secondary | ICD-10-CM | POA: Insufficient documentation

## 2022-06-07 DIAGNOSIS — Z853 Personal history of malignant neoplasm of breast: Secondary | ICD-10-CM | POA: Diagnosis not present

## 2022-06-07 DIAGNOSIS — I10 Essential (primary) hypertension: Secondary | ICD-10-CM | POA: Insufficient documentation

## 2022-06-07 DIAGNOSIS — R519 Headache, unspecified: Secondary | ICD-10-CM | POA: Diagnosis not present

## 2022-06-07 MED ORDER — KETOROLAC TROMETHAMINE 30 MG/ML IJ SOLN
30.0000 mg | Freq: Once | INTRAMUSCULAR | Status: AC
  Administered 2022-06-07: 30 mg via INTRAVENOUS
  Filled 2022-06-07: qty 1

## 2022-06-07 MED ORDER — ALPRAZOLAM 0.25 MG PO TABS
0.2500 mg | ORAL_TABLET | Freq: Once | ORAL | Status: AC
  Administered 2022-06-07: 0.125 mg via ORAL
  Filled 2022-06-07: qty 1

## 2022-06-07 MED ORDER — PREDNISONE 20 MG PO TABS: 60.0000 mg | ORAL_TABLET | Freq: Every day | ORAL | 0 refills | Status: DC

## 2022-06-07 MED ORDER — METHYLPREDNISOLONE SODIUM SUCC 125 MG IJ SOLR
125.0000 mg | Freq: Once | INTRAMUSCULAR | Status: AC
  Administered 2022-06-07: 125 mg via INTRAVENOUS
  Filled 2022-06-07: qty 2

## 2022-06-07 MED ORDER — ONDANSETRON HCL 4 MG/2ML IJ SOLN
4.0000 mg | Freq: Once | INTRAMUSCULAR | Status: AC
Start: 2022-06-07 — End: 2022-06-07
  Administered 2022-06-07: 4 mg via INTRAVENOUS
  Filled 2022-06-07: qty 2

## 2022-06-07 MED ORDER — SODIUM CHLORIDE 0.9 % IV BOLUS
1000.0000 mL | Freq: Once | INTRAVENOUS | Status: AC
  Administered 2022-06-07: 1000 mL via INTRAVENOUS

## 2022-06-07 NOTE — ED Provider Notes (Signed)
[facility] Provider Note    Event Date/Time   First MD Initiated Contact with Patient 06/07/22 1723     (approximate)   History   Migraine   HPI  Jennifer [name] is a 66 y.o. female presents today with acute on chronic migraine.  Patient reports that this is her typical migraine.  She has been closely following with her neurologist and alternates between steroid injections and Botox.  She has attempted using her abortive therapy as prescribed without any success.  She denies any new symptoms or fevers.  Symptoms have been present for the past 5 or 6 days.  Past Medical History:  Diagnosis Date   Allergic rhinitis    Anxiety    Arthritis    hands, knees   Breast cancer ([place]) 2019   Right Breast Cancer   Cancer ([place]) 09/2018   Breast CA new DX, right breast   Cervical dysplasia    Common bile duct dilation    Diabetes mellitus without complication ([facility])    Endometriosis    Esophageal reflux    Family history of adverse reaction to anesthesia    younger sister anxiety for a dew days after   Family history of breast cancer    Family history of breast cancer    Family history of prostate cancer    Hepatic cyst 08/2020   multiple   Hepatic hemangioma 08/2020   intrahepatic hemangioma   Hiatal hernia    Hypertension    Hypothyroid    Migraine with aura, without mention of intractable migraine without mention of status migrainosus    Osteopenia 08/2019   T score -2.2 FRAX 11% / 1.7%   Personal history of radiation therapy 2019   Right Breast Cancer   Pre-diabetes      Physical Exam   Triage Vital Signs: ED Triage Vitals  Enc Vitals Group     BP 06/07/22 1612 127/73     Pulse Rate 06/07/22 1612 71     Resp 06/07/22 1612 18     Temp 06/07/22 1612 98.5 F (36.9 C)     Temp Source 06/07/22 1612 Oral     SpO2 06/07/22 1612 96 %     Weight 06/07/22 1610 130 lb (59 kg)     Height 06/07/22 1610 '5\' 1"'$  (1.549 m)     Head Circumference --       Peak Flow --      Pain Score 06/07/22 1610 3     Pain Loc --      Pain Edu? --      Excl. in [place]? --     Most recent vital signs: Vitals:   06/07/22 1612  BP: 127/73  Pulse: 71  Resp: 18  Temp: 98.5 F (36.9 C)  SpO2: 96%    General: Awake, no distress.  CV:  Good peripheral perfusion.  Resp:  Normal effort.  Abd:  No distention.  Other:     ED Results / Procedures / Treatments   Labs (all labs ordered are listed, but only abnormal results are displayed) Labs Reviewed - No data to display   EKG     RADIOLOGY  CT head negative for acute concerns.  I have independently reviewed and interpreted imaging as well as reviewed report from radiology.  PROCEDURES:  Critical Care performed: No  Procedures   MEDICATIONS ORDERED IN ED:  Medications  ALPRAZolam (XANAX) tablet 0.25 mg (has no administration in time range)  sodium chloride  0.9 % bolus 1,000 mL (1,000 mLs Intravenous New Bag/Given 06/07/22 1828)  methylPREDNISolone sodium succinate (SOLU-MEDROL) 125 mg/2 mL injection 125 mg (125 mg Intravenous Given 06/07/22 1833)  ketorolac (TORADOL) 30 MG/ML injection 30 mg (30 mg Intravenous Given 06/07/22 1831)  ondansetron (ZOFRAN) injection 4 mg (4 mg Intravenous Given 06/07/22 1831)     IMPRESSION / MDM / ASSESSMENT AND PLAN / ED COURSE   I reviewed the triage vital signs and the nursing notes.  Differential diagnosis includes, but is not limited to: Acute on chronic migraine intracranial lesion/mass  Patient's presentation is most consistent with severe exacerbation of chronic illness.  66 year old female presenting to the emergency department for treatment and evaluation of acute on chronic headache.  Patient states that she has approximately 8 headache days per month but has to work very hard to keep it as low as that.  She follows closely with her neurologist.  Headache that she presents with today has no atypical symptoms.  Plan will be to give fluids and  headache cocktail which patient reports typically breaks the headache cycle and she functions well afterward.  She does typically  require 3 days of oral prednisone.  Patient feeling better, but very anxious and is afraid if she goes home right now, her headache will return as it was prior to arrival. Home dose of Xanax ordered. Will plan for discharge after she is able to feel more calm.      FINAL CLINICAL IMPRESSION(S) / ED DIAGNOSES   Final diagnoses:  Migraine without status migrainosus, not intractable, unspecified migraine type     Rx / DC Orders   ED Discharge Orders          Ordered    predniSONE (DELTASONE) 20 MG tablet  Daily with breakfast        06/07/22 1934             Note:  This document was prepared using Dragon voice recognition software and may include unintentional dictation errors.   Victorino Dike, FNP 06/07/22 1945    Duffy Bruce, MD 06/15/22 [phone]

## 2022-06-07 NOTE — ED Triage Notes (Signed)
Pt via POV from home. Pt c/o migraine since Monday. States that she took all her prescribed medication without any relief. Pt has a hx of migraine. States that this feels like her normal migraine. Pt is A&Ox4 and NAD

## 2022-06-07 NOTE — ED Provider Triage Note (Signed)
Emergency Medicine Provider Triage Evaluation Note  Jennifer [name] , a 66 y.o. female  was evaluated in triage.  Pt complains of migraine headache.  Patient has a history of chronic migraines.  Takes atenolol as well as abortive medications for her headache.  She states that she started in physical line for her teeth prior to the onset of headache.  Headache does not have any significant atypical features, she is here for migraine cocktail which she has had in the past with good success.  She states that she typically has Zofran, Solu-Medrol and Toradol with 2 days of prednisone which typically alleviates her symptoms..  Review of Systems  Positive: Migraine headache Negative: Vision changes, unilateral weakness, slurred speech  Physical Exam  BP 127/73 (BP Location: Left Arm)   Pulse 71   Temp 98.5 F (36.9 C) (Oral)   Resp 18   Ht '5\' 1"'$  (1.549 m)   Wt 59 kg   SpO2 96%   BMI 24.56 kg/m  Gen:   Awake, no distress   Resp:  Normal effort  MSK:   Moves extremities without difficulty  Other:  Cranial nerves II to XII grossly intact.  Negative pronator drift.  Medical Decision Making  Medically screening exam initiated at 4:19 PM.  Appropriate orders placed.  Jennifer [name] was informed that the remainder of the evaluation will be completed by another provider, this initial triage assessment does not replace that evaluation, and the importance of remaining in the ED until their evaluation is complete.  Patient presents with a week of migraine.  No atypical features.  Given the length of migraine however I will image the patient.  Patient is here requesting a migraine cocktail which she states typically alleviates her symptoms.   Jennifer Moll, PA-C 06/07/22 1621

## 2022-06-08 ENCOUNTER — Other Ambulatory Visit: Payer: Self-pay | Admitting: Family Medicine

## 2022-06-15 DIAGNOSIS — M5459 Other low back pain: Secondary | ICD-10-CM | POA: Diagnosis not present

## 2022-06-17 DIAGNOSIS — M5459 Other low back pain: Secondary | ICD-10-CM | POA: Diagnosis not present

## 2022-06-19 ENCOUNTER — Telehealth (INDEPENDENT_AMBULATORY_CARE_PROVIDER_SITE_OTHER): Payer: Medicare HMO | Admitting: Adult Health

## 2022-06-19 ENCOUNTER — Encounter: Payer: Self-pay | Admitting: Adult Health

## 2022-06-19 DIAGNOSIS — U071 COVID-19: Secondary | ICD-10-CM

## 2022-06-19 MED ORDER — NIRMATRELVIR/RITONAVIR (PAXLOVID)TABLET: 3.0000 | ORAL_TABLET | Freq: Two times a day (BID) | ORAL | 0 refills | Status: AC

## 2022-06-19 NOTE — Progress Notes (Signed)
Virtual Visit via Video Note  I connected with Jennifer [name] on 06/19/22 at 10:30 AM EDT by a video enabled telemedicine application and verified that I am speaking with the correct person using two identifiers.  Location patient: home Location provider:work or home office Persons participating in the virtual visit: patient, provider  I discussed the limitations of evaluation and management by telemedicine and the availability of in person appointments. The patient expressed understanding and agreed to proceed.   HPI:  66 year old female who I am seeing today for an acute issue of covid 19 infection   She reports that yesterday evening she started to have nasal drip and fatigue.  She tested at home for COVID-19 and her test showed that she was positive.  When she woke up this morning her symptoms seem to be worse.  Symptoms include sore throat, cough, chills, body aches, worsening fatigue.  She denies fevers, nausea, vomiting, or diarrhea.  She is wondering if she is a candidate for Paxlovid   ROS: See pertinent positives and negatives per HPI.  Past Medical History:  Diagnosis Date   Allergic rhinitis    Anxiety    Arthritis    hands, knees   Breast cancer ([place]) 2019   Right Breast Cancer   Cancer ([place]) 09/2018   Breast CA new DX, right breast   Cervical dysplasia    Common bile duct dilation    Diabetes mellitus without complication ([place])    Endometriosis    Esophageal reflux    Family history of adverse reaction to anesthesia    younger sister anxiety for a dew days after   Family history of breast cancer    Family history of breast cancer    Family history of prostate cancer    Hepatic cyst 08/2020   multiple   Hepatic hemangioma 08/2020   intrahepatic hemangioma   Hiatal hernia    Hypertension    Hypothyroid    Migraine with aura, without mention of intractable migraine without mention of status migrainosus    Osteopenia 08/2019   T score -2.2 FRAX 11% / 1.7%    Personal history of radiation therapy 2019   Right Breast Cancer   Pre-diabetes     Past Surgical History:  Procedure Laterality Date   BIOPSY  09/12/2020   Procedure: BIOPSY;  Surgeon: [name], MD;  Location: [facility];  Service: Endoscopy;;   BREAST EXCISIONAL BIOPSY Bilateral over 10 years ago   benign x 2    BREAST LUMPECTOMY Right 10/10/2018   BREAST LUMPECTOMY WITH RADIOACTIVE SEED AND SENTINEL LYMPH NODE BIOPSY Right 10/10/2018   Procedure: RIGHT BREAST LUMPECTOMY WITH RADIOACTIVE SEED AND RIGHT SENTINEL LYMPH NODE BIOPSY;  Surgeon: [name], MD;  Location: [facility];  Service: General;  Laterality: Right;   BREAST SURGERY     Benign breast lump excised   CERVICAL CONE BIOPSY  1985   severe dysplasia   COLONOSCOPY  2005 and dec 2019   normal   COLPOSCOPY     endoscopy  10/2018   ESOPHAGOGASTRODUODENOSCOPY (EGD) WITH PROPOFOL N/A 09/12/2020   Procedure: ESOPHAGOGASTRODUODENOSCOPY (EGD) WITH PROPOFOL;  Surgeon: [name], MD;  Location: [facility];  Service: Endoscopy;  Laterality: N/A;   EUS N/A 09/12/2020   Procedure: UPPER ENDOSCOPIC ULTRASOUND (EUS) RADIAL;  Surgeon: [name], MD;  Location: [facility];  Service: Endoscopy;  Laterality: N/A;   LAPAROSCOPIC ASSISTED VAGINAL HYSTERECTOMY     LAPAROSCOPIC BILATERAL SALPINGO OOPHERECTOMY  Bilateral 10/27/2019   Procedure: LAPAROSCOPIC BILATERAL SALPINGO OOPHORECTOMY WITH PERITONEAL WASHINGS;  Surgeon: Princess Bruins, MD;  Location: [facility];  Service: Gynecology;  Laterality: Bilateral;  request 1:00pm on Friday, Dec. 4th in [place] time held requests one hour OR time   moles removed from upper back, face lft, 1 between breasts, upper leg left inside  10/18/2019   wearing small round bandaids on for 2 weeks   ROTATOR CUFF REPAIR Bilateral 08/2008   UPPER GASTROINTESTINAL ENDOSCOPY      Family History  Problem Relation Age of Onset    Breast cancer Mother 66   Diabetes Mother    Hypertension Father    Heart disease Father    Hypertension Sister    Anuerysm Sister        head   Hypertension Sister    Anxiety disorder Sister    Other Sister        prediabetes   Osteoporosis Maternal Grandmother    Diabetes Maternal Grandmother    COPD Paternal Grandmother    Heart disease Paternal Grandfather    Breast cancer Cousin 30       pat first cousin   Breast cancer Cousin        mat second cousin with breast cancer in her 71s   Thyroid cancer Cousin 65       pat first cousin   Prostate cancer Other 79   Colon cancer Neg Hx    Stomach cancer Neg Hx    Esophageal cancer Neg Hx    Pancreatic cancer Neg Hx    Rectal cancer Neg Hx        Current Outpatient Medications:    nirmatrelvir/ritonavir EUA (PAXLOVID) 20 x 150 MG & 10 x '100MG'$  TABS, Take 3 tablets by mouth 2 (two) times daily for 5 days. (Take nirmatrelvir 150 mg two tablets twice daily for 5 days and ritonavir 100 mg one tablet twice daily for 5 days) Patient GFR is 86, Disp: 30 tablet, Rfl: 0   ALPRAZolam (XANAX) 0.5 MG tablet, TAKE 1/2 TO 1 TABLET BY MOUTH ONCE DAILY AS NEEDED FOR SLEEP/ANXIETY, Disp: 15 tablet, Rfl: 3   atenolol (TENORMIN) 25 MG tablet, Take 12.5 mg by mouth 3 (three) times daily. For migraine prevention, Disp: , Rfl:    B Complex Vitamins (B COMPLEX PO), Take by mouth., Disp: , Rfl:    baclofen (LIORESAL) 10 MG tablet, Take by mouth., Disp: , Rfl:    CALCIUM CARBONATE-VITAMIN D PO, Take 1 tablet by mouth daily. 1000 mg, Disp: , Rfl:    famotidine (PEPCID) 40 MG tablet, Take 40 mg by mouth 2 (two) times daily. , Disp: , Rfl:    hydrocortisone 2.5 % cream, Apply to affected areas BID PRN as directed., Disp: 30 g, Rfl: 3   hydrOXYzine (ATARAX/VISTARIL) 25 MG tablet, Take 1 tablet (25 mg total) by mouth 3 (three) times daily as needed. (Patient taking differently: Take 25 mg by mouth 2 (two) times daily.), Disp: 30 tablet, Rfl: 0   ketorolac  (TORADOL) 10 MG tablet, Take 1 tablet (10 mg total) by mouth every 6 (six) hours as needed for moderate pain., Disp: 12 tablet, Rfl: 0   letrozole (FEMARA) 2.5 MG tablet, TAKE 1 TABLET BY MOUTH EVERY DAY, Disp: 90 tablet, Rfl: 3   levocetirizine (XYZAL) 5 MG tablet, Take 5 mg by mouth at bedtime. , Disp: , Rfl: 3   levothyroxine (SYNTHROID) 75 MCG tablet, TAKE ONE TABLET BY MOUTH  ONE TIME DAILY BEFORE BREAKFAST, Disp: 90 tablet, Rfl: 1   Magnesium Oxide 400 (240 Mg) MG TABS, Take 400 mg by mouth daily. , Disp: , Rfl:    metFORMIN (GLUCOPHAGE-XR) 500 MG 24 hr tablet, TAKE 1 TABLET BY MOUTH EVERY DAY WITH BREAKFAST, Disp: 90 tablet, Rfl: 1   Multiple Vitamin (MULTIVITAMIN WITH MINERALS) TABS tablet, Take 1 tablet by mouth daily., Disp: , Rfl:    Omega-3 Fatty Acids (FISH OIL) 1000 MG CAPS, Take 1,000 mg by mouth daily. , Disp: , Rfl:    OnabotulinumtoxinA (BOTOX IJ), Inject 1 Dose as directed every 6 (six) weeks. , Disp: , Rfl:    ondansetron (ZOFRAN ODT) 4 MG disintegrating tablet, Take 1 tablet (4 mg total) by mouth every 8 (eight) hours as needed., Disp: 20 tablet, Rfl: 0   pantoprazole (PROTONIX) 40 MG tablet, TAKE ONE TABLET BY MOUTH ONE TIME DAILY, Disp: 90 tablet, Rfl: 1   predniSONE (DELTASONE) 20 MG tablet, Take 3 tablets (60 mg total) by mouth daily with breakfast., Disp: 9 tablet, Rfl: 0   PRESCRIPTION MEDICATION, Inject into the skin every 6 (six) weeks. Steroid Trigger, Disp: , Rfl:    Probiotic Product (PROBIOTIC-10 PO), Take 1 capsule by mouth daily. , Disp: , Rfl:    rizatriptan (MAXALT) 10 MG tablet, Take 1 tablet (10 mg total) by mouth once as needed for up to 10 days for migraine. May repeat in 2 hours if needed, Disp: 10 tablet, Rfl: 0   triamcinolone (KENALOG) 0.1 %, Apply to affected areas BID PRN for up to two weeks at a time as directed., Disp: 45 g, Rfl: 3   TURMERIC PO, Take 1 tablet by mouth daily., Disp: , Rfl:   EXAM:  VITALS per patient if applicable:  GENERAL:  alert, oriented, appears well and in no acute distress  HEENT: atraumatic, conjunttiva clear, no obvious abnormalities on inspection of external nose and ears  NECK: normal movements of the head and neck  LUNGS: on inspection no signs of respiratory distress, breathing rate appears normal, no obvious gross SOB, gasping or wheezing  CV: no obvious cyanosis  MS: moves all visible extremities without noticeable abnormality  PSYCH/NEURO: pleasant and cooperative, no obvious depression or anxiety, speech and thought processing grossly intact  ASSESSMENT AND PLAN:  Discussed the following assessment and plan:  1. COVID-19 virus infection - Will treat with paxlovid. We reviewed side effects of medication. All questions answered. Follow up with PCP if not improving in the next 2-3 days  - nirmatrelvir/ritonavir EUA (PAXLOVID) 20 x 150 MG & 10 x '100MG'$  TABS; Take 3 tablets by mouth 2 (two) times daily for 5 days. (Take nirmatrelvir 150 mg two tablets twice daily for 5 days and ritonavir 100 mg one tablet twice daily for 5 days) Patient GFR is 86  Dispense: 30 tablet; Refill: 0      I discussed the assessment and treatment plan with the patient. The patient was provided an opportunity to ask questions and all were answered. The patient agreed with the plan and demonstrated an understanding of the instructions.   The patient was advised to call back or seek an in-person evaluation if the symptoms worsen or if the condition fails to improve as anticipated.   Dorothyann Peng, NP

## 2022-06-22 DIAGNOSIS — M5459 Other low back pain: Secondary | ICD-10-CM | POA: Diagnosis not present

## 2022-06-24 DIAGNOSIS — M5459 Other low back pain: Secondary | ICD-10-CM | POA: Diagnosis not present

## 2022-06-29 ENCOUNTER — Encounter: Payer: Self-pay | Admitting: Family Medicine

## 2022-06-29 ENCOUNTER — Telehealth (INDEPENDENT_AMBULATORY_CARE_PROVIDER_SITE_OTHER): Payer: Medicare HMO | Admitting: Family Medicine

## 2022-06-29 VITALS — HR 82 | Temp 97.6°F | Ht 61.0 in

## 2022-06-29 DIAGNOSIS — U071 COVID-19: Secondary | ICD-10-CM

## 2022-06-29 MED ORDER — PREDNISONE 20 MG PO TABS: 20.0000 mg | ORAL_TABLET | Freq: Every day | ORAL | 0 refills | Status: DC

## 2022-06-29 NOTE — Progress Notes (Signed)
Jennifer [name] T. Jennifer Miklos, MD, [place] at [facility] [place], [address]  Phone: [phone]  FAX: [place] - 66 y.o. female  MRN [id number]  Date of Birth: January 13, 1956  Date: 06/29/2022  PCP: Abner Greenspan, MD  Referral: Abner Greenspan, MD  Chief Complaint  Patient presents with   Nasal Congestion   Cough    Patient has E-Visit on 07/28 for Covid.  Tx with Paxlovid and finished course on 06/23/22. On 8/5 which was day 10-patient felt good.  On Sunday patient felt good in am but by the afternoon her nose started running.  Took another Covid test and it was very positive.   Wondering if she has rebound Covid   Virtual Visit via Video Note:  I connected with  Jennifer [name] on 06/29/2022  3:20 PM EDT by a video enabled telemedicine application and verified that I am speaking with the correct person using two identifiers.   Location patient: home computer, tablet, or smartphone Location provider: work or home office Consent: Verbal consent directly obtained from [employer]. Persons participating in the virtual visit: patient, provider  I discussed the limitations of evaluation and management by telemedicine and the availability of in person appointments. The patient expressed understanding and agreed to proceed.  Chief Complaint  Patient presents with   Nasal Congestion   Cough    Patient has E-Visit on 07/28 for Covid.  Tx with Paxlovid and finished course on 06/23/22. On 8/5 which was day 10-patient felt good.  On Sunday patient felt good in am but by the afternoon her nose started running.  Took another Covid test and it was very positive.   Wondering if she has rebound Covid    History of Present Illness:  Rebound covid  Patient initially became symptomatic on June 19, 2019 3 in the evening.  Subsequently she had a home COVID test that was positive and she had an E-visit the following  day.  Subsequently she started taking Paxlovid and she felt better the following morning.  She did have a backache, global polyarthralgias as well as chills.  She never had a fever and she has been eating and drinking normally since then.  She became approaching asymptomatic and then yesterday she started to have a runny nose and feel bad again.  She took another COVID test, and it was positive.  She is also taking Mucinex twice daily.  She had some methylprednisolone at home, so she took 8 mg of methylprednisolone.  Review of Systems as above: See pertinent positives and pertinent negatives per HPI No acute distress verbally   Observations/Objective/Exam:  An attempt was made to discern vital signs over the phone and per patient if applicable and possible.   General:    Alert, Oriented, appears well and in no acute distress  Pulmonary:     On inspection no signs of respiratory distress.  Psych / Neurological:     Pleasant and cooperative.  Assessment and Plan:    ICD-10-CM   1. COVID-19  U07.1      COVID-19 for 10 days with some resolution of symptoms with Paxlovid and now with some COVID rebound.  We reviewed COVID-19 in general, symptoms, and resurgence of symptoms post COVID and post-Covid.  Continue supportive care, continue Mucinex.  Given the level of congestion and some difficulty breathing, i am going to give her some prednisone.  I discussed the  assessment and treatment plan with the patient. The patient was provided an opportunity to ask questions and all were answered. The patient agreed with the plan and demonstrated an understanding of the instructions.   The patient was advised to call back or seek an in-person evaluation if the symptoms worsen or if the condition fails to improve as anticipated.  Follow-up: prn unless noted otherwise below No follow-ups on file.  Meds ordered this encounter  Medications   predniSONE (DELTASONE) 20 MG tablet    Sig: Take 1  tablet (20 mg total) by mouth daily with breakfast.    Dispense:  10 tablet    Refill:  0   No orders of the defined types were placed in this encounter.   Signed,  Jennifer Deed. Garima Chronis, MD

## 2022-06-30 ENCOUNTER — Telehealth: Payer: Medicare HMO | Admitting: Adult Health

## 2022-07-01 ENCOUNTER — Emergency Department
Admission: EM | Admit: 2022-07-01 | Discharge: 2022-07-02 | Disposition: A | Discharge status: Home / Self Care | Payer: Medicare HMO | Attending: Emergency Medicine | Admitting: Emergency Medicine

## 2022-07-01 ENCOUNTER — Encounter: Payer: Self-pay | Admitting: Emergency Medicine

## 2022-07-01 ENCOUNTER — Other Ambulatory Visit: Payer: Self-pay

## 2022-07-01 ENCOUNTER — Emergency Department: Payer: Medicare HMO

## 2022-07-01 DIAGNOSIS — M5459 Other low back pain: Secondary | ICD-10-CM | POA: Diagnosis not present

## 2022-07-01 DIAGNOSIS — E119 Type 2 diabetes mellitus without complications: Secondary | ICD-10-CM | POA: Insufficient documentation

## 2022-07-01 DIAGNOSIS — I10 Essential (primary) hypertension: Secondary | ICD-10-CM | POA: Insufficient documentation

## 2022-07-01 DIAGNOSIS — R519 Headache, unspecified: Secondary | ICD-10-CM | POA: Diagnosis not present

## 2022-07-01 DIAGNOSIS — Z8616 Personal history of COVID-19: Secondary | ICD-10-CM | POA: Diagnosis not present

## 2022-07-01 DIAGNOSIS — J3489 Other specified disorders of nose and nasal sinuses: Secondary | ICD-10-CM | POA: Diagnosis not present

## 2022-07-01 DIAGNOSIS — R42 Dizziness and giddiness: Secondary | ICD-10-CM | POA: Diagnosis not present

## 2022-07-01 DIAGNOSIS — E039 Hypothyroidism, unspecified: Secondary | ICD-10-CM | POA: Diagnosis not present

## 2022-07-01 DIAGNOSIS — E871 Hypo-osmolality and hyponatremia: Secondary | ICD-10-CM | POA: Diagnosis not present

## 2022-07-01 DIAGNOSIS — E876 Hypokalemia: Secondary | ICD-10-CM | POA: Insufficient documentation

## 2022-07-01 DIAGNOSIS — Z853 Personal history of malignant neoplasm of breast: Secondary | ICD-10-CM | POA: Insufficient documentation

## 2022-07-01 LAB — BASIC METABOLIC PANEL
Anion gap: 11 (ref 5–15)
BUN: 19 mg/dL (ref 8–23)
CO2: 26 mmol/L (ref 22–32)
Calcium: 8.7 mg/dL — ABNORMAL LOW (ref 8.9–10.3)
Chloride: 93 mmol/L — ABNORMAL LOW (ref 98–111)
Creatinine, Ser: 0.65 mg/dL (ref 0.44–1.00)
GFR, Estimated: >60 mL/min (ref >60)
Glucose, Bld: 92 mg/dL (ref 70–99)
Potassium: 3.4 mmol/L — ABNORMAL LOW (ref 3.5–5.1)
Sodium: 130 mmol/L — ABNORMAL LOW (ref 135–145)

## 2022-07-01 LAB — CBC
HCT: 35.6 % — ABNORMAL LOW (ref 36.0–46.0)
Hemoglobin: 12 g/dL (ref 12.0–15.0)
MCH: 30.9 pg (ref 26.0–34.0)
MCHC: 33.7 g/dL (ref 30.0–36.0)
MCV: 91.8 fL (ref 80.0–100.0)
Platelets: 253 10*3/uL (ref 150–400)
RBC: 3.88 MIL/uL (ref 3.87–5.11)
RDW: 12.5 % (ref 11.5–15.5)
WBC: 9.8 10*3/uL (ref 4.0–10.5)
nRBC: 0 % (ref 0.0–0.2)

## 2022-07-01 NOTE — ED Provider Triage Note (Signed)
  Emergency Medicine Provider Triage Evaluation Note  Jennifer [name] , a 66 y.o.female,  was evaluated in triage.  Pt complains of headache and dizziness that started at approximately 1330 this afternoon.  This was immediately following an acupuncture appointment she has for her migraines.  She states that the pain dizziness that she is currently feeling is not consistent with her previous migraines.  She was also treated for COVID approximately 2 weeks ago and is unsure if this is related.   Review of Systems  Positive: Headache, dizziness Negative: Denies fever, chest pain, vomiting  Physical Exam   Vitals:   07/01/22 1822 07/01/22 1823  BP:  138/71  Pulse: 61   Resp: 18   Temp: 98.4 F (36.9 C)   SpO2: 100%    Gen:   Awake, appears uncomfortable Resp:  Normal effort  MSK:   Moves extremities without difficulty  Other:  No gross neurological deficits.  Medical Decision Making  Given the patient's initial medical screening exam, the following diagnostic evaluation has been ordered. The patient will be placed in the appropriate treatment space, once one is available, to complete the evaluation and treatment. I have discussed the plan of care with the patient and I have advised the patient that an ED physician or mid-level practitioner will reevaluate their condition after the test results have been received, as the results may give them additional insight into the type of treatment they may need.    Diagnostics: Labs, head CT, UA  Treatments: none immediately   Teodoro Spray, [place] 07/01/22 [place]

## 2022-07-01 NOTE — ED Triage Notes (Signed)
Patient c/o dizziness and headache onset of 1:30 this afternoon. Patient states she went to an acupuncture appt for migraine before. Patient adds that she was treated for covid 2 weeks ago and is unsure if it is related.

## 2022-07-01 NOTE — ED Provider Notes (Signed)
[facility] Provider Note    Event Date/Time   First MD Initiated Contact with Patient 07/01/22 2340     (approximate)   History   Dizziness   HPI  Jennifer [name] is a 66 y.o. female with past medical history of anxiety, arthritis, DM, HTN, hypothyroidism, migraine headaches, osteopenia reportedly COVID diagnosis 2 weeks ago who presents for evaluation of headache and dizziness    Past Medical History:  Diagnosis Date   Allergic rhinitis    Anxiety    Arthritis    hands, knees   Breast cancer ([place]) 2019   Right Breast Cancer   Cancer ([place]) 09/2018   Breast CA new DX, right breast   Cervical dysplasia    Common bile duct dilation    Diabetes mellitus without complication ([facility])    Endometriosis    Esophageal reflux    Family history of adverse reaction to anesthesia    younger sister anxiety for a dew days after   Family history of breast cancer    Family history of breast cancer    Family history of prostate cancer    Hepatic cyst 08/2020   multiple   Hepatic hemangioma 08/2020   intrahepatic hemangioma   Hiatal hernia    Hypertension    Hypothyroid    Migraine with aura, without mention of intractable migraine without mention of status migrainosus    Osteopenia 08/2019   T score -2.2 FRAX 11% / 1.7%   Personal history of radiation therapy 2019   Right Breast Cancer   Pre-diabetes      Physical Exam  Triage Vital Signs: ED Triage Vitals  Enc Vitals Group     BP 07/01/22 1823 138/71     Pulse Rate 07/01/22 1822 61     Resp 07/01/22 1822 18     Temp 07/01/22 1822 98.4 F (36.9 C)     Temp Source 07/01/22 1822 Oral     SpO2 07/01/22 1822 100 %     Weight 07/01/22 1824 130 lb (59 kg)     Height 07/01/22 1824 '5\' 1"'$  (1.549 m)     Head Circumference --      Peak Flow --      Pain Score 07/01/22 1823 5     Pain Loc --      Pain Edu? --      Excl. in [place]? --     Most recent vital signs: Vitals:   07/01/22 1822 07/01/22 1823   BP:  138/71  Pulse: 61   Resp: 18   Temp: 98.4 F (36.9 C)   SpO2: 100%     General: Awake, no distress. *** CV:  Good peripheral perfusion. *** Resp:  Normal effort. *** Abd:  No distention. *** Other:  ***   ED Results / Procedures / Treatments  Labs (all labs ordered are listed, but only abnormal results are displayed) Labs Reviewed  BASIC METABOLIC PANEL - Abnormal; Notable for the following components:      Result Value   Sodium 130 (*)    Potassium 3.4 (*)    Chloride 93 (*)    Calcium 8.7 (*)    All other components within normal limits  CBC - Abnormal; Notable for the following components:   HCT 35.6 (*)    All other components within normal limits  URINALYSIS, ROUTINE W REFLEX MICROSCOPIC     EKG  ECG my interpretation shows sinus rhythm with a ventricular rate of 60, normal  axis, unremarkable intervals with nonspecific ST changes in V3, aVF, V2 and V3 without any other clear evidence of acute ischemia or significant arrhythmia.  RADIOLOGY  CT head without contrast rotation without evidence of acute ischemia, edema, or mass effect.  I reviewed radiology interpretation and agree the findings are same in addition to some paranasal sinus mucosal thickening.   PROCEDURES:  Critical Care performed: {CriticalCareYesNo:19197::"Yes, see critical care procedure note(s)","No"}  Procedures {If the patient is on the monitor, remove the brackets and asterisks. Otherwise delete the sentence below:1} {**The patient is on the cardiac monitor to evaluate for evidence of arrhythmia and/or significant heart rate changes.**}   MEDICATIONS ORDERED IN ED: Medications - No data to display   IMPRESSION / MDM / [place] / ED COURSE  I reviewed the triage vital signs and the nursing notes. Patient's presentation is most consistent with acute presentation with potential threat to life or bodily function.                               Differential diagnosis  includes, but is not limited to, ***  ECG my interpretation shows sinus rhythm with a ventricular rate of 60, normal axis, unremarkable intervals with nonspecific ST changes in V3, aVF, V2 and V3 without any other clear evidence of acute ischemia or significant arrhythmia.  CT head without contrast rotation without evidence of acute ischemia, edema, or mass effect.  I reviewed radiology interpretation and agree the findings are same in addition to some paranasal sinus mucosal thickening.      FINAL CLINICAL IMPRESSION(S) / ED DIAGNOSES   Final diagnoses:  None     Rx / DC Orders   ED Discharge Orders     None        Note:  This document was prepared using Dragon voice recognition software and may include unintentional dictation errors.

## 2022-07-02 LAB — URINALYSIS, ROUTINE W REFLEX MICROSCOPIC
Bacteria, UA: NONE SEEN
Bilirubin Urine: NEGATIVE
Glucose, UA: NEGATIVE mg/dL
Ketones, ur: NEGATIVE mg/dL
Leukocytes,Ua: NEGATIVE
Nitrite: NEGATIVE
Protein, ur: NEGATIVE mg/dL
Specific Gravity, Urine: 1.001 — ABNORMAL LOW (ref 1.005–1.030)
Squamous Epithelial / HPF: NONE SEEN (ref 0–5)
WBC, UA: NONE SEEN WBC/hpf (ref 0–5)
pH: 7 (ref 5.0–8.0)

## 2022-07-02 LAB — TROPONIN I (HIGH SENSITIVITY): Troponin I (High Sensitivity): 3 ng/L (ref <18)

## 2022-07-02 LAB — MAGNESIUM: Magnesium: 2.2 mg/dL (ref 1.7–2.4)

## 2022-07-02 MED ORDER — POTASSIUM CHLORIDE CRYS ER 20 MEQ PO TBCR
40.0000 meq | EXTENDED_RELEASE_TABLET | Freq: Once | ORAL | Status: AC
  Administered 2022-07-02: 40 meq via ORAL
  Filled 2022-07-02: qty 2

## 2022-07-02 NOTE — Discharge Instructions (Addendum)
Blood pressure was slightly elevated today.  Please have this rechecked when you follow-up with your PCP and have your electrolytes rechecked at that time as well.

## 2022-07-02 NOTE — ED Notes (Signed)
Pt aware of need for urine sample at this time. States recently went around time she left lobby for exam room, beverage and specimen cup provided

## 2022-07-04 DIAGNOSIS — M5459 Other low back pain: Secondary | ICD-10-CM | POA: Diagnosis not present

## 2022-07-06 ENCOUNTER — Inpatient Hospital Stay: Payer: Medicare HMO | Attending: Hematology and Oncology

## 2022-07-06 ENCOUNTER — Other Ambulatory Visit: Payer: Medicare HMO

## 2022-07-06 ENCOUNTER — Other Ambulatory Visit: Payer: Self-pay

## 2022-07-06 DIAGNOSIS — Z79811 Long term (current) use of aromatase inhibitors: Secondary | ICD-10-CM | POA: Insufficient documentation

## 2022-07-06 DIAGNOSIS — M8580 Other specified disorders of bone density and structure, unspecified site: Secondary | ICD-10-CM | POA: Diagnosis not present

## 2022-07-06 DIAGNOSIS — M5459 Other low back pain: Secondary | ICD-10-CM | POA: Diagnosis not present

## 2022-07-06 DIAGNOSIS — C50211 Malignant neoplasm of upper-inner quadrant of right female breast: Secondary | ICD-10-CM | POA: Insufficient documentation

## 2022-07-06 DIAGNOSIS — Z170 Estrogen receptor positive status [ER+]: Secondary | ICD-10-CM | POA: Insufficient documentation

## 2022-07-06 DIAGNOSIS — D509 Iron deficiency anemia, unspecified: Secondary | ICD-10-CM | POA: Diagnosis not present

## 2022-07-06 LAB — CBC WITH DIFFERENTIAL (CANCER CENTER ONLY)
Abs Immature Granulocytes: 0.02 10*3/uL (ref 0.00–0.07)
Basophils Absolute: 0 10*3/uL (ref 0.0–0.1)
Basophils Relative: 0 %
Eosinophils Absolute: 0.2 10*3/uL (ref 0.0–0.5)
Eosinophils Relative: 2 %
HCT: 38.6 % (ref 36.0–46.0)
Hemoglobin: 13.4 g/dL (ref 12.0–15.0)
Immature Granulocytes: 0 %
Lymphocytes Relative: 33 %
Lymphs Abs: 2.5 10*3/uL (ref 0.7–4.0)
MCH: 31.2 pg (ref 26.0–34.0)
MCHC: 34.7 g/dL (ref 30.0–36.0)
MCV: 89.8 fL (ref 80.0–100.0)
Monocytes Absolute: 0.8 10*3/uL (ref 0.1–1.0)
Monocytes Relative: 11 %
Neutro Abs: 4 10*3/uL (ref 1.7–7.7)
Neutrophils Relative %: 54 %
Platelet Count: 301 10*3/uL (ref 150–400)
RBC: 4.3 MIL/uL (ref 3.87–5.11)
RDW: 12.8 % (ref 11.5–15.5)
WBC Count: 7.5 10*3/uL (ref 4.0–10.5)
nRBC: 0 % (ref 0.0–0.2)

## 2022-07-06 LAB — IRON AND IRON BINDING CAPACITY (CC-WL,HP ONLY)
Iron: 111 ug/dL (ref 28–170)
Saturation Ratios: 29 % (ref 10.4–31.8)
TIBC: 381 ug/dL (ref 250–450)
UIBC: 270 ug/dL (ref 148–442)

## 2022-07-06 LAB — CMP (CANCER CENTER ONLY)
ALT: 16 U/L (ref 0–44)
AST: 13 U/L — ABNORMAL LOW (ref 15–41)
Albumin: 4.2 g/dL (ref 3.5–5.0)
Alkaline Phosphatase: 86 U/L (ref 38–126)
Anion gap: 3 — ABNORMAL LOW (ref 5–15)
BUN: 15 mg/dL (ref 8–23)
CO2: 31 mmol/L (ref 22–32)
Calcium: 9.4 mg/dL (ref 8.9–10.3)
Chloride: 100 mmol/L (ref 98–111)
Creatinine: 0.74 mg/dL (ref 0.44–1.00)
GFR, Estimated: >60 mL/min (ref >60)
Glucose, Bld: 72 mg/dL (ref 70–99)
Potassium: 3.9 mmol/L (ref 3.5–5.1)
Sodium: 134 mmol/L — ABNORMAL LOW (ref 135–145)
Total Bilirubin: 0.4 mg/dL (ref 0.3–1.2)
Total Protein: 6.9 g/dL (ref 6.5–8.1)

## 2022-07-07 LAB — FERRITIN: Ferritin: 71 ng/mL (ref 11–307)

## 2022-07-08 ENCOUNTER — Inpatient Hospital Stay: Payer: Medicare HMO | Admitting: Hematology and Oncology

## 2022-07-08 ENCOUNTER — Other Ambulatory Visit: Payer: Self-pay

## 2022-07-08 DIAGNOSIS — M8580 Other specified disorders of bone density and structure, unspecified site: Secondary | ICD-10-CM | POA: Diagnosis not present

## 2022-07-08 DIAGNOSIS — Z170 Estrogen receptor positive status [ER+]: Secondary | ICD-10-CM | POA: Diagnosis not present

## 2022-07-08 DIAGNOSIS — C50211 Malignant neoplasm of upper-inner quadrant of right female breast: Secondary | ICD-10-CM

## 2022-07-08 DIAGNOSIS — Z79811 Long term (current) use of aromatase inhibitors: Secondary | ICD-10-CM | POA: Diagnosis not present

## 2022-07-08 DIAGNOSIS — G43719 Chronic migraine without aura, intractable, without status migrainosus: Secondary | ICD-10-CM | POA: Diagnosis not present

## 2022-07-08 DIAGNOSIS — M542 Cervicalgia: Secondary | ICD-10-CM | POA: Diagnosis not present

## 2022-07-08 DIAGNOSIS — D509 Iron deficiency anemia, unspecified: Secondary | ICD-10-CM

## 2022-07-08 DIAGNOSIS — G518 Other disorders of facial nerve: Secondary | ICD-10-CM | POA: Diagnosis not present

## 2022-07-08 DIAGNOSIS — M791 Myalgia, unspecified site: Secondary | ICD-10-CM | POA: Diagnosis not present

## 2022-07-08 NOTE — Assessment & Plan Note (Addendum)
10/10/2018: Right lumpectomy: IDC grade 1, 1.2 cm, with intermediate grade DCIS, margins negative, 0/2 lymph nodes negative, T1CN0 stage Ia ER 100%, PR 90%, Ki-67 10%, HER-2 1+ negative  Oncotype DX: 29: 18% risk of recurrence   Treatment plan:  1. Recommended systemic adjuvant chemotherapy with Taxotere and Cytoxan every 3 weeks x4 cycles (patient refused)  2. Adjuvant radiation therapy 11/08/2018-12/05/2018  3. Adjuvant antiestrogen therapy with anastrozole started 12/05/2018 switched to letrozole 10/09/19   Letrozole toxicities: Denies any adverse effects to letrozole  Breast cancer surveillance: 1.  Mammogram 09/26/2021: Benign breast density category D 2. bone density 10/10/2021: T score -2.4: Osteopenia 3.  Breast exam 07/08/2022: Benign  Return to clinic in 1 year for follow-up

## 2022-07-08 NOTE — Progress Notes (Signed)
Patient Care Team: Tower, [name], MD as PCP - General Nicholas Lose, MD as Consulting Physician (Hematology and Oncology) Kyung Rudd, MD as Consulting Physician (Radiation Oncology) Jovita Kussmaul, MD as Consulting Physician (General Surgery)  DIAGNOSIS:  Encounter Diagnoses  Name Primary?   Malignant neoplasm of upper-inner quadrant of right breast in female, estrogen receptor positive ([place])    Iron deficiency anemia, unspecified iron deficiency anemia type     SUMMARY OF ONCOLOGIC HISTORY: Oncology History  Malignant neoplasm of upper-inner quadrant of right breast in female, estrogen receptor positive ([place])  09/13/2018 Initial Diagnosis   Diagnostic mammogram detected right breast mass with distortion LIQ at 2:00 9 cm from nipple 1.7 cm, at 1:00 there was a benign 1.2 cm fibrocystic change, biopsy of the 2:00 mass revealed grade 1 IDC with DCIS ER 100%, PR 90%, Ki-67 10%, HER-2 1+ negative, T1CN0 stage Ia clinical stage   10/10/2018 Surgery   Right lumpectomy: IDC grade 1, 1.2 cm, with intermediate grade DCIS, margins negative, 0/2 lymph nodes negative, T1CN0 stage Ia ER 100%, PR 90%, Ki-67 10%, HER-2 1+ negative    10/10/2018 Oncotype testing   oncotype results of 29: Risk of recurrence without chemo 18%   11/07/2018 - 12/08/2018 Radiation Therapy   1. Right Breast / 42.56 Gy in 16 fractions 2. Right Breast Boost / 8 Gy in 4 fractions - Total dose 50.56 Gy   12/2018 -  Anti-estrogen oral therapy   Anastrozole daily, planned for 7 years   11/11/2019 Genetic Testing   Negative genetic testing on the common hereditary cancer panel.  The Common Hereditary Gene Panel offered by [facility] includes sequencing and/or deletion duplication testing of the following 48 genes: APC, ATM, AXIN2, BARD1, BMPR1A, BRCA1, BRCA2, BRIP1, CDH1, CDK4, CDKN2A (p14ARF), CDKN2A (p16INK4a), CHEK2, CTNNA1, DICER1, EPCAM (Deletion/duplication testing only), GREM1 (promoter region deletion/duplication  testing only), KIT, MEN1, MLH1, MSH2, MSH3, MSH6, MUTYH, NBN, NF1, NHTL1, PALB2, PDGFRA, PMS2, POLD1, POLE, PTEN, RAD50, RAD51C, RAD51D, RNF43, SDHB, SDHC, SDHD, SMAD4, SMARCA4. STK11, TP53, TSC1, TSC2, and VHL.  The following genes were evaluated for sequence changes only: SDHA and HOXB13 c.251G>A variant only. The report date is 11/11/2019     CHIEF COMPLIANT: Follow-up of right breast cancer on letrozole   INTERVAL HISTORY: Jennifer [name] is a 66 y.o. with above-mentioned history of right breast cancer who underwent lumpectomy, radiation, and is currently on anastrozole. She presents to the clinic today for follow-up. She denies muscle aches. She is tolerating letrozole extremely well. Denies any new side effects. She does still have the hot flashes. She has been doing the acupuncture and headaches has got better. Denies pain and discomfort in breast.   ALLERGIES:  is allergic to ciprofloxacin, codeine phosphate, decongestant [pseudoephedrine], flonase [fluticasone propionate], hydrocodone, macrobid [nitrofurantoin], nasonex [mometasone], reglan [metoclopramide], topamax [topiramate], benadryl [diphenhydramine hcl], and sulfa antibiotics.  MEDICATIONS:  Current Outpatient Medications  Medication Sig Dispense Refill   ALPRAZolam (XANAX) 0.5 MG tablet TAKE 1/2 TO 1 TABLET BY MOUTH ONCE DAILY AS NEEDED FOR SLEEP/ANXIETY 15 tablet 3   atenolol (TENORMIN) 25 MG tablet Take 12.5 mg by mouth 3 (three) times daily. For migraine prevention     B Complex Vitamins (B COMPLEX PO) Take by mouth.     baclofen (LIORESAL) 10 MG tablet Take by mouth.     CALCIUM CARBONATE-VITAMIN D PO Take 1 tablet by mouth daily. 1000 mg     famotidine (PEPCID) 40 MG tablet Take 40 mg by mouth 2 (  two) times daily.      hydrocortisone 2.5 % cream Apply to affected areas BID PRN as directed. 30 g 3   hydrOXYzine (ATARAX) 25 MG tablet Take 37.5 mg by mouth at bedtime as needed.     ketorolac (TORADOL) 10 MG tablet Take 1 tablet  (10 mg total) by mouth every 6 (six) hours as needed for moderate pain. 12 tablet 0   letrozole (FEMARA) 2.5 MG tablet TAKE 1 TABLET BY MOUTH EVERY DAY 90 tablet 3   levocetirizine (XYZAL) 5 MG tablet Take 5 mg by mouth at bedtime.   3   levothyroxine (SYNTHROID) 75 MCG tablet TAKE ONE TABLET BY MOUTH ONE TIME DAILY BEFORE BREAKFAST 90 tablet 1   Magnesium Oxide 400 (240 Mg) MG TABS Take 400 mg by mouth daily.      metFORMIN (GLUCOPHAGE-XR) 500 MG 24 hr tablet TAKE 1 TABLET BY MOUTH EVERY DAY WITH BREAKFAST 90 tablet 1   Multiple Vitamin (MULTIVITAMIN WITH MINERALS) TABS tablet Take 1 tablet by mouth daily.     Omega-3 Fatty Acids (FISH OIL) 1000 MG CAPS Take 1,000 mg by mouth daily.      OnabotulinumtoxinA (BOTOX IJ) Inject 1 Dose as directed every 6 (six) weeks.      ondansetron (ZOFRAN ODT) 4 MG disintegrating tablet Take 1 tablet (4 mg total) by mouth every 8 (eight) hours as needed. 20 tablet 0   pantoprazole (PROTONIX) 40 MG tablet TAKE ONE TABLET BY MOUTH ONE TIME DAILY 90 tablet 1   predniSONE (DELTASONE) 20 MG tablet Take 1 tablet (20 mg total) by mouth daily with breakfast. 10 tablet 0   PRESCRIPTION MEDICATION Inject into the skin every 6 (six) weeks. Steroid Trigger     Probiotic Product (PROBIOTIC-10 PO) Take 1 capsule by mouth daily.      rizatriptan (MAXALT) 10 MG tablet Take 1 tablet (10 mg total) by mouth once as needed for up to 10 days for migraine. May repeat in 2 hours if needed 10 tablet 0   triamcinolone (KENALOG) 0.1 % Apply to affected areas BID PRN for up to two weeks at a time as directed. 45 g 3   TURMERIC PO Take 1 tablet by mouth daily.     No current facility-administered medications for this visit.    PHYSICAL EXAMINATION: ECOG PERFORMANCE STATUS: 1 - Symptomatic but completely ambulatory  Vitals:   07/08/22 1120  BP: (!) 113/55  Pulse: (!) 59  Resp: 18  Temp: 97.7 F (36.5 C)  SpO2: 97%   Filed Weights   07/08/22 1120  Weight: 130 lb (59 kg)     BREAST: No palpable masses or nodules in either right or left breasts. No palpable axillary supraclavicular or infraclavicular adenopathy no breast tenderness or nipple discharge. (exam performed in the presence of a chaperone)  LABORATORY DATA:  I have reviewed the data as listed    Latest Ref Rng & Units 07/06/2022   12:52 PM 07/01/2022    6:26 PM 01/05/2022    3:32 PM  CMP  Glucose 70 - 99 mg/dL 72  92  81   BUN 8 - 23 mg/dL 15  19  12    Creatinine 0.44 - 1.00 mg/dL 0.74  0.65  0.71   Sodium 135 - 145 mmol/L 134  130  134   Potassium 3.5 - 5.1 mmol/L 3.9  3.4  4.3   Chloride 98 - 111 mmol/L 100  93  98   CO2 22 - 32 mmol/L 31  26  29   Calcium 8.9 - 10.3 mg/dL 9.4  8.7  9.6   Total Protein 6.5 - 8.1 g/dL 6.9   7.3   Total Bilirubin 0.3 - 1.2 mg/dL 0.4   0.5   Alkaline Phos 38 - 126 U/L 86   92   AST 15 - 41 U/L 13   16   ALT 0 - 44 U/L 16   16     Lab Results  Component Value Date   WBC 7.5 07/06/2022   HGB 13.4 07/06/2022   HCT 38.6 07/06/2022   MCV 89.8 07/06/2022   PLT 301 07/06/2022   NEUTROABS 4.0 07/06/2022    ASSESSMENT & PLAN:  Malignant neoplasm of upper-inner quadrant of right breast in female, estrogen receptor positive ([place]) 10/10/2018: Right lumpectomy: IDC grade 1, 1.2 cm, with intermediate grade DCIS, margins negative, 0/2 lymph nodes negative, T1CN0 stage Ia ER 100%, PR 90%, Ki-67 10%, HER-2 1+ negative  Oncotype DX: 29: 18% risk of recurrence   Treatment plan:  1. Recommended systemic adjuvant chemotherapy with Taxotere and Cytoxan every 3 weeks x4 cycles (patient refused)  2. Adjuvant radiation therapy 11/08/2018-12/05/2018  3. Adjuvant antiestrogen therapy with anastrozole started 12/05/2018 switched to letrozole 10/09/19   Letrozole toxicities: Denies any adverse effects to letrozole  Breast cancer surveillance: 1.  Mammogram 09/26/2021: Benign breast density category D 2. bone density 10/10/2021: T score -2.4: Osteopenia 3.  Breast exam  07/08/2022: Benign  Return to clinic in 1 year for follow-up   Iron deficiency anemia Etiology: Blood loss versus malabsorption August 2022: 3 doses of Venofer Follows with gastroenterology and has had endoscopies and colonoscopies. Lab review:  01/05/2022: Hemoglobin 13, MCV 90, 25% iron saturation, 94 ferritin  07/06/2022: Hemoglobin 13.4, MCV 89.8, iron saturation 29%, ferritin 71  I discussed with the patient that she does not need any IV iron at this time.   Recheck labs in  1 year and follow-up after that to discuss results.    Orders Placed This Encounter  Procedures   CBC with Differential ([place] Only)    Standing Status:   Future    Standing Expiration Date:   07/09/2023   CMP ([place] only)    Standing Status:   Future    Standing Expiration Date:   07/09/2023   Ferritin    Standing Status:   Future    Standing Expiration Date:   07/08/2023   Iron and Iron Binding Capacity ([facility] only)    Standing Status:   Future    Standing Expiration Date:   07/09/2023   The patient has a good understanding of the overall plan. she agrees with it. she will call with any problems that may develop before the next visit here. Total time spent: 30 mins including face to face time and time spent for planning, charting and co-ordination of care   Jennifer Ohara, MD 07/08/22  I Jennifer [name] am scribing for Jennifer [name]  I have reviewed the above documentation for accuracy and completeness, and I agree with the above.

## 2022-07-08 NOTE — Assessment & Plan Note (Signed)
Etiology: Blood loss versus malabsorption August 2022: 3 doses of Venofer Follows with gastroenterology and has had endoscopies and colonoscopies. Lab review:  01/05/2022: Hemoglobin 13, MCV 90, 25% iron saturation, 94 ferritin  07/06/2022: Hemoglobin 13.4, MCV 89.8, iron saturation 29%, ferritin 71  I discussed with the patient that she does not need any IV iron at this time.  Recheck labs in  1 year and follow-up after that to discuss results.

## 2022-07-11 DIAGNOSIS — M5459 Other low back pain: Secondary | ICD-10-CM | POA: Diagnosis not present

## 2022-07-13 DIAGNOSIS — M5459 Other low back pain: Secondary | ICD-10-CM | POA: Diagnosis not present

## 2022-07-15 DIAGNOSIS — M5459 Other low back pain: Secondary | ICD-10-CM | POA: Diagnosis not present

## 2022-07-20 DIAGNOSIS — M5459 Other low back pain: Secondary | ICD-10-CM | POA: Diagnosis not present

## 2022-07-22 ENCOUNTER — Other Ambulatory Visit: Payer: Self-pay

## 2022-07-22 NOTE — Patient Outreach (Signed)
[facility]) Care Management  07/22/2022  Jennifer [name] June 07, 1956 [id number]   Telephone Screen    Outreach call to patient to introduce [facility] services and assess care needs as part of benefit of PCP office and insurance plan.No answer. RN CM left HIPAA compliant voicemail message along with contact info.      Plan: Outreach attempt will be made to patient at another time.    Enzo Montgomery, RN,BSN,CCM [place] Management Telephonic Care Management Coordinator Direct Phone: [phone] Toll Free: [phone] Fax: [phone]

## 2022-07-23 DIAGNOSIS — M5459 Other low back pain: Secondary | ICD-10-CM | POA: Diagnosis not present

## 2022-07-24 ENCOUNTER — Other Ambulatory Visit: Payer: Self-pay

## 2022-07-24 NOTE — Patient Outreach (Signed)
  Care Coordination   07/24/2022 Name: Jennifer [name] MRN: [id number] DOB: March 15, 1956   Care Coordination Outreach Attempts:  A second unsuccessful outreach was attempted today to offer the patient with information about available care coordination services as a benefit of their health plan.     Follow Up Plan:  Additional outreach attempts will be made to offer the patient care coordination information and services.   Encounter Outcome:  No Answer  Care Coordination Interventions Activated:  No   Care Coordination Interventions:  No, not indicated     Enzo Montgomery, RN,BSN,CCM [place] Management Telephonic Care Management Coordinator Direct Phone: [phone] Toll Free: [phone] Fax: [phone]

## 2022-07-27 ENCOUNTER — Encounter: Payer: Self-pay | Admitting: Intensive Care

## 2022-07-27 ENCOUNTER — Emergency Department: Payer: Medicare HMO

## 2022-07-27 ENCOUNTER — Emergency Department
Admission: EM | Admit: 2022-07-27 | Discharge: 2022-07-27 | Disposition: A | Discharge status: Home / Self Care | Payer: Medicare HMO | Attending: Emergency Medicine | Admitting: Emergency Medicine

## 2022-07-27 ENCOUNTER — Other Ambulatory Visit: Payer: Self-pay

## 2022-07-27 DIAGNOSIS — S46911A Strain of unspecified muscle, fascia and tendon at shoulder and upper arm level, right arm, initial encounter: Secondary | ICD-10-CM | POA: Insufficient documentation

## 2022-07-27 DIAGNOSIS — X58XXXA Exposure to other specified factors, initial encounter: Secondary | ICD-10-CM | POA: Insufficient documentation

## 2022-07-27 DIAGNOSIS — S4991XA Unspecified injury of right shoulder and upper arm, initial encounter: Secondary | ICD-10-CM | POA: Diagnosis present

## 2022-07-27 DIAGNOSIS — M25511 Pain in right shoulder: Secondary | ICD-10-CM | POA: Diagnosis not present

## 2022-07-27 MED ORDER — PREDNISONE 20 MG PO TABS: 40.0000 mg | ORAL_TABLET | Freq: Every day | ORAL | 0 refills | Status: AC

## 2022-07-27 MED ORDER — KETOROLAC TROMETHAMINE 30 MG/ML IJ SOLN
30.0000 mg | Freq: Once | INTRAMUSCULAR | Status: AC
  Administered 2022-07-27: 30 mg via INTRAMUSCULAR
  Filled 2022-07-27: qty 1

## 2022-07-27 NOTE — ED Provider Notes (Signed)
[facility] Provider Note    Event Date/Time   First MD Initiated Contact with Patient 07/27/22 1230     (approximate)   History   Shoulder Pain (right)   HPI  Jennifer [name] is a 66 y.o. female with a history of breast cancer presently followed for remission adjuvant treatment  She also has a history of frequent chronic migraines.  She been working with acupuncturist and massage therapist for some time now due to frequent migraines follows with her neurologist as well.  They decided to trial a very deep tissue massage to her right upper trapezius and shoulder area a few days ago.  She reports it was intensely painful to the point she was in tears, and since that time has had pretty severe pain in her right shoulder particularly across the top and the outer side of the right shoulder.  She has tried baclofen at home with some relief, warmth which helps relieve pain, but pain is fairly sharp worsened with shoulder movement and intensifies when she attempts to use the right shoulder.  A little bit of a tingling feeling that accompanies the pain that will occasionally run out to the areas of the hand       Physical Exam   Triage Vital Signs: ED Triage Vitals  Enc Vitals Group     BP 07/27/22 1153 (!) 159/92     Pulse Rate 07/27/22 1153 66     Resp 07/27/22 1153 16     Temp 07/27/22 1153 98.7 F (37.1 C)     Temp Source 07/27/22 1153 Oral     SpO2 07/27/22 1153 99 %     Weight 07/27/22 1154 130 lb (59 kg)     Height 07/27/22 1154 '5\' 1"'$  (1.549 m)     Head Circumference --      Peak Flow --      Pain Score 07/27/22 1154 3     Pain Loc --      Pain Edu? --      Excl. in [place]? --     Most recent vital signs: Vitals:   07/27/22 1153  BP: (!) 159/92  Pulse: 66  Resp: 16  Temp: 98.7 F (37.1 C)  SpO2: 99%     General: Awake, no distress.  Very pleasant CV:  Good peripheral perfusion.  Strong right radial pulse.  Normal heart  tones. Resp:  Normal effort.  Clear bilaterally.  Soft normal work of breathing Abd:  No distention.  Other:  No lower extremity edema  Examination of the right upper arm strong pulse normal median radial and ulnar sensation.  Normal use of the right hand without motor neurodeficit.  Use the elbow able to range the shoulder through good range of motion, but when isolating strap muscles identifies pain across the anterior and superior region of the shoulder especially with isolation of infraspinatus  No bruising or edema of the back, right upper extremity, shoulder region.  Does have isolated discomfort especially along the insertion of the trapezius around the posterior shoulder joint and also along the shoulder itself  ED Results / Procedures / Treatments   Labs (all labs ordered are listed, but only abnormal results are displayed) Labs Reviewed - No data to display   EKG     RADIOLOGY Right shoulder x-ray interpreted by me, normal with exception to mild degenerative changes    PROCEDURES:  Critical Care performed: No  Procedures   MEDICATIONS ORDERED IN ED:  Medications  ketorolac (TORADOL) 30 MG/ML injection 30 mg (has no administration in time range)     IMPRESSION / MDM / ASSESSMENT AND PLAN / ED COURSE  I reviewed the triage vital signs and the nursing notes.                              Differential diagnosis includes, but is not limited to, possible muscle strain or tear, hematoma, bruising, contusion, possible injuries from musculoskeletal causes, shoulder tear etc.  No evidence of would suggest a complete tear of any of the strap muscles.  Examination seems to isolate pain with evaluation of infraspinatus in particular.  Patient's presentation is most consistent with acute complicated illness / injury requiring diagnostic workup.  ----------------------------------------- 1:34 PM on 07/27/2022 ----------------------------------------- Conservative  management at this time.  Patient reports familiar with Toradol, has to use it a couple times yearly usually for migraines.  Will trial for relief of pain.  She does not wish for any prescription pain medications or additional muscle relaxants.  She has already set up a follow-up visit with orthopedics for the shoulder pain, but that is not planned for about 2 weeks.  I encouraged her to keep this unless her symptoms have resolved.  I discussed with her I do not think MRI is indicated today I do not think she has a complete rupture of any muscle, but if she were to develop worsening symptoms or pain or symptoms persist over the next few weeks then I would consider MRI  Patient comfortable with this plan.  Also comfortable with plan to trial short burst of steroid which she is quite familiar with its use for migraines.  She last used steroids about a month ago  Return precautions and treatment recommendations and follow-up discussed with the patient who is agreeable with the plan.     FINAL CLINICAL IMPRESSION(S) / ED DIAGNOSES   Final diagnoses:  Acute pain of right shoulder  Right shoulder strain, initial encounter     Rx / DC Orders   ED Discharge Orders          Ordered    predniSONE (DELTASONE) 20 MG tablet  Daily with breakfast        07/27/22 1329             Note:  This document was prepared using Dragon voice recognition software and may include unintentional dictation errors.   Delman Kitten, MD 07/27/22 1335

## 2022-07-27 NOTE — ED Triage Notes (Signed)
Patient c/o right shoulder pain that has progressively gotten worse. Had acupuncture and message therapy Thursday and after pain started.

## 2022-07-28 DIAGNOSIS — G43719 Chronic migraine without aura, intractable, without status migrainosus: Secondary | ICD-10-CM | POA: Diagnosis not present

## 2022-07-28 DIAGNOSIS — G518 Other disorders of facial nerve: Secondary | ICD-10-CM | POA: Diagnosis not present

## 2022-07-28 DIAGNOSIS — M791 Myalgia, unspecified site: Secondary | ICD-10-CM | POA: Diagnosis not present

## 2022-07-28 DIAGNOSIS — M542 Cervicalgia: Secondary | ICD-10-CM | POA: Diagnosis not present

## 2022-07-29 ENCOUNTER — Telehealth: Payer: Self-pay

## 2022-07-29 NOTE — Patient Outreach (Signed)
  Care Coordination   Initial Visit Note   07/29/2022 Name: Jennifer [name] MRN: [id number] DOB: 02-08-1956  Jennifer [name] is a 66 y.o. year old female who sees Tower, [name], MD for primary care. Incoming call from [place] call. I spoke with  Jennifer [name] by phone today.  What matters to the patients health and wellness today?  Patient states her health is going well. 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 Denies any RN CM need or concerns at this time. Patient able to afford meds and no transportation issues.     Goals Addressed             This Visit's Progress    COMPLETED: Care Coordination Activities-no follow up required       Care Coordination Interventions: Patient interviewed about adult health maintenance status including  Pneumonia Vaccine Influenza Vaccine AWV-pt will call and make an appt Historical Immunization for documented Influenza Vaccine Assessed SDOH          SDOH assessments and interventions completed:  Yes  SDOH Interventions Today    Flowsheet Row Most Recent Value  SDOH Interventions   Food Insecurity Interventions Intervention Not Indicated  Transportation Interventions Intervention Not Indicated        Care Coordination Interventions Activated:  Yes  Care Coordination Interventions:  Yes, provided info regarding AWV, immunization and health maintenance measures   Follow up plan: No further intervention required.   Encounter Outcome:  Pt. Visit Completed    Enzo Montgomery, RN,BSN,CCM [place] Management Telephonic Care Management Coordinator Direct Phone: [phone] Toll Free: [phone] Fax: [phone]

## 2022-07-29 NOTE — Patient Outreach (Signed)
  Care Coordination   07/29/2022 Name: Jennifer [name] MRN: [id number] DOB: 05/03/1956   Care Coordination Outreach Attempts:  A third unsuccessful outreach was attempted today to offer the patient with information about available care coordination services as a benefit of their health plan.   Follow Up Plan:  No further outreach attempts will be made at this time. We have been unable to contact the patient to offer or enroll patient in care coordination services  Encounter Outcome:  No Answer  Care Coordination Interventions Activated:  No   Care Coordination Interventions:  No, not indicated     Enzo Montgomery, RN,BSN,CCM [place] Management Telephonic Care Management Coordinator Direct Phone: [phone] Toll Free: [phone] Fax: [phone]

## 2022-07-31 ENCOUNTER — Telehealth: Payer: Self-pay | Admitting: *Deleted

## 2022-07-31 NOTE — Telephone Encounter (Signed)
     Patient  visit on 07/27/2022  at [facility]  Ed was for pain  Have you been able to follow up with your primary care physician? Saw a nuerologist and is doing good and the Ed was wonderful The patient was able to obtain any needed medicine or equipment.  Are there diet recommendations that you are having difficulty following?  Patient expresses understanding of discharge instructions and education provided has no other needs at this time.   [facility] [phone] [address] , [place] [address] Email : [email or web address]

## 2022-08-04 ENCOUNTER — Encounter: Payer: Self-pay | Admitting: Family Medicine

## 2022-08-04 ENCOUNTER — Ambulatory Visit (INDEPENDENT_AMBULATORY_CARE_PROVIDER_SITE_OTHER): Payer: Medicare HMO | Admitting: Family Medicine

## 2022-08-04 DIAGNOSIS — H00015 Hordeolum externum left lower eyelid: Secondary | ICD-10-CM | POA: Diagnosis not present

## 2022-08-04 DIAGNOSIS — H00019 Hordeolum externum unspecified eye, unspecified eyelid: Secondary | ICD-10-CM | POA: Insufficient documentation

## 2022-08-04 MED ORDER — AMOXICILLIN-POT CLAVULANATE 875-125 MG PO TABS: 1.0000 | ORAL_TABLET | Freq: Two times a day (BID) | ORAL | 0 refills | Status: DC

## 2022-08-04 NOTE — Patient Instructions (Signed)
Take the augmentin as directed   Follow up on Friday as planned for a re check   Continue warm compresses Keep clean-face and eye area and hands   If redness/swelling worsen -call and let us know  If any vision change or light sensitivity (not related to migraine)  let us know

## 2022-08-04 NOTE — Progress Notes (Signed)
Subjective:    Patient ID: Jennifer [name], female    DOB: Mar 23, 1956, 66 y.o.   MRN: [id number]  HPI Pt presents with L eye swelling  Wt Readings from Last 3 Encounters:  08/04/22 132 lb (59.9 kg)  07/27/22 130 lb (59 kg)  07/08/22 130 lb (59 kg)   24.94 kg/m  Left eye problem  At least 2 weeks   Started with puffiness with a stye  Started doing hot compresses -improved a little  Can feel a bump Now has a bump on lower lid also   Eyeball- feels some pressure from the stye  No light sensitivity  Unsure if one of the styes popped - she occ sees a film and gets a little discharge A little crusty   Has not felt like herself ever since covid in July   Patient Active Problem List   Diagnosis Date Noted   Hordeolum externum (stye) 08/04/2022   Raynaud's disease 03/17/2022   Left facial swelling 01/25/2022   Left ear pain 01/23/2022   Acute cystitis with hematuria 11/26/2021   Right leg pain 10/15/2021   Microcytic anemia 07/07/2021   De Quervain's tenosynovitis, right 04/18/2021   Iron deficiency anemia 09/08/2020   Common bile duct dilatation 09/06/2020   Opacity of lung on imaging study 09/06/2020   Dizziness 07/10/2020   History of breast cancer 03/01/2020   Genetic testing 11/14/2019   Bilateral hand pain 11/03/2019   Family history of prostate cancer    Adnexal cyst 09/26/2019   Family history of breast cancer    Malignant neoplasm of upper-inner quadrant of right breast in female, estrogen receptor positive ([place]) 09/20/2018   B12 deficiency 04/13/2018   Joint pain 04/11/2018   Colon cancer screening 09/16/2017   Prediabetes 07/28/2016   Allergic rhinitis 11/13/2014   Screening for lipoid disorders 09/26/2014   Preventative health care 09/26/2014   Dysuria 08/07/2014   Routine general medical examination at a health care facility 10/28/2012   GERD 09/16/2010   Hypothyroidism 05/07/2008   Osteopenia 05/07/2008   Migraine with aura 11/11/2007   Past  Medical History:  Diagnosis Date   Allergic rhinitis    Anxiety    Arthritis    hands, knees   Breast cancer ([place]) 2019   Right Breast Cancer   Cancer ([place]) 09/2018   Breast CA new DX, right breast   Cervical dysplasia    Common bile duct dilation    Diabetes mellitus without complication ([place])    Endometriosis    Esophageal reflux    Family history of adverse reaction to anesthesia    younger sister anxiety for a dew days after   Family history of breast cancer    Family history of breast cancer    Family history of prostate cancer    Hepatic cyst 08/2020   multiple   Hepatic hemangioma 08/2020   intrahepatic hemangioma   Hiatal hernia    Hypertension    Hypothyroid    Migraine with aura, without mention of intractable migraine without mention of status migrainosus    Osteopenia 08/2019   T score -2.2 FRAX 11% / 1.7%   Personal history of radiation therapy 2019   Right Breast Cancer   Pre-diabetes    Past Surgical History:  Procedure Laterality Date   BIOPSY  09/12/2020   Procedure: BIOPSY;  Surgeon: [name], MD;  Location: [facility];  Service: Endoscopy;;   BREAST EXCISIONAL BIOPSY Bilateral over 10 years ago   benign  x 2    BREAST LUMPECTOMY Right 10/10/2018   BREAST LUMPECTOMY WITH RADIOACTIVE SEED AND SENTINEL LYMPH NODE BIOPSY Right 10/10/2018   Procedure: RIGHT BREAST LUMPECTOMY WITH RADIOACTIVE SEED AND RIGHT SENTINEL LYMPH NODE BIOPSY;  Surgeon: [name], MD;  Location: [facility];  Service: General;  Laterality: Right;   BREAST SURGERY     Benign breast lump excised   CERVICAL CONE BIOPSY  1985   severe dysplasia   COLONOSCOPY  2005 and dec 2019   normal   COLPOSCOPY     endoscopy  10/2018   ESOPHAGOGASTRODUODENOSCOPY (EGD) WITH PROPOFOL N/A 09/12/2020   Procedure: ESOPHAGOGASTRODUODENOSCOPY (EGD) WITH PROPOFOL;  Surgeon: [name], MD;  Location: [facility];  Service: Endoscopy;  Laterality: N/A;   EUS N/A  09/12/2020   Procedure: UPPER ENDOSCOPIC ULTRASOUND (EUS) RADIAL;  Surgeon: [name], MD;  Location: [facility];  Service: Endoscopy;  Laterality: N/A;   LAPAROSCOPIC ASSISTED VAGINAL HYSTERECTOMY     LAPAROSCOPIC BILATERAL SALPINGO OOPHERECTOMY Bilateral 10/27/2019   Procedure: LAPAROSCOPIC BILATERAL SALPINGO OOPHORECTOMY WITH PERITONEAL WASHINGS;  Surgeon: Princess Bruins, MD;  Location: [facility];  Service: Gynecology;  Laterality: Bilateral;  request 1:00pm on Friday, Dec. 4th in [place] time held requests one hour OR time   moles removed from upper back, face lft, 1 between breasts, upper leg left inside  10/18/2019   wearing small round bandaids on for 2 weeks   ROTATOR CUFF REPAIR Bilateral 08/2008   UPPER GASTROINTESTINAL ENDOSCOPY     Social History   Tobacco Use   Smoking status: Never   Smokeless tobacco: Never  Vaping Use   Vaping Use: Never used  Substance Use Topics   Alcohol use: No    Alcohol/week: 0.0 standard drinks of alcohol   Drug use: No   Family History  Problem Relation Age of Onset   Breast cancer Mother 52   Diabetes Mother    Hypertension Father    Heart disease Father    Hypertension Sister    Anuerysm Sister        head   Hypertension Sister    Anxiety disorder Sister    Other Sister        prediabetes   Osteoporosis Maternal Grandmother    Diabetes Maternal Grandmother    COPD Paternal Grandmother    Heart disease Paternal Grandfather    Breast cancer Cousin 41       pat first cousin   Breast cancer Cousin        mat second cousin with breast cancer in her 39s   Thyroid cancer Cousin 63       pat first cousin   Prostate cancer Other 69   Colon cancer Neg Hx    Stomach cancer Neg Hx    Esophageal cancer Neg Hx    Pancreatic cancer Neg Hx    Rectal cancer Neg Hx    Allergies  Allergen Reactions   Ciprofloxacin Other (See Comments)    Dizziness    Codeine Phosphate Other (See Comments)     Dizziness, heart palpitations, nervous   Decongestant [Pseudoephedrine] Other (See Comments)    dizziness   Flonase [Fluticasone Propionate] Other (See Comments)    Worsens her migraine    Hydrocodone Other (See Comments)    Made nervous--10/09 surgery rotator cuff   Macrobid [Nitrofurantoin]     headaches   Nasonex [Mometasone]     Migraines   Reglan [Metoclopramide] Other (See Comments)  Dizziness   Topamax [Topiramate] Other (See Comments)    Dizziness    Benadryl [Diphenhydramine Hcl] Palpitations   Sulfa Antibiotics Anxiety   Current Outpatient Medications on File Prior to Visit  Medication Sig Dispense Refill   ALPRAZolam (XANAX) 0.5 MG tablet TAKE 1/2 TO 1 TABLET BY MOUTH ONCE DAILY AS NEEDED FOR SLEEP/ANXIETY 15 tablet 3   atenolol (TENORMIN) 25 MG tablet Take 12.5 mg by mouth 3 (three) times daily. For migraine prevention     B Complex Vitamins (B COMPLEX PO) Take by mouth.     baclofen (LIORESAL) 10 MG tablet Take by mouth.     CALCIUM CARBONATE-VITAMIN D PO Take 1 tablet by mouth daily. 1000 mg     famotidine (PEPCID) 40 MG tablet Take 40 mg by mouth 2 (two) times daily.      hydrocortisone 2.5 % cream Apply to affected areas BID PRN as directed. 30 g 3   hydrOXYzine (ATARAX) 25 MG tablet Take 37.5 mg by mouth at bedtime as needed.     ketorolac (TORADOL) 10 MG tablet Take 1 tablet (10 mg total) by mouth every 6 (six) hours as needed for moderate pain. 12 tablet 0   letrozole (FEMARA) 2.5 MG tablet TAKE 1 TABLET BY MOUTH EVERY DAY 90 tablet 3   levocetirizine (XYZAL) 5 MG tablet Take 5 mg by mouth at bedtime.   3   levothyroxine (SYNTHROID) 75 MCG tablet TAKE ONE TABLET BY MOUTH ONE TIME DAILY BEFORE BREAKFAST 90 tablet 1   Magnesium Oxide 400 (240 Mg) MG TABS Take 400 mg by mouth daily.      metFORMIN (GLUCOPHAGE-XR) 500 MG 24 hr tablet TAKE 1 TABLET BY MOUTH EVERY DAY WITH BREAKFAST 90 tablet 1   Multiple Vitamin (MULTIVITAMIN WITH MINERALS) TABS tablet Take 1 tablet  by mouth daily.     Omega-3 Fatty Acids (FISH OIL) 1000 MG CAPS Take 1,000 mg by mouth daily.      OnabotulinumtoxinA (BOTOX IJ) Inject 1 Dose as directed every 6 (six) weeks.      ondansetron (ZOFRAN ODT) 4 MG disintegrating tablet Take 1 tablet (4 mg total) by mouth every 8 (eight) hours as needed. 20 tablet 0   pantoprazole (PROTONIX) 40 MG tablet TAKE ONE TABLET BY MOUTH ONE TIME DAILY 90 tablet 1   PRESCRIPTION MEDICATION Inject into the skin every 6 (six) weeks. Steroid Trigger     Probiotic Product (PROBIOTIC-10 PO) Take 1 capsule by mouth daily.      triamcinolone (KENALOG) 0.1 % Apply to affected areas BID PRN for up to two weeks at a time as directed. 45 g 3   TURMERIC PO Take 1 tablet by mouth daily.     rizatriptan (MAXALT) 10 MG tablet Take 1 tablet (10 mg total) by mouth once as needed for up to 10 days for migraine. May repeat in 2 hours if needed 10 tablet 0   No current facility-administered medications on file prior to visit.      Review of Systems  Constitutional:  Negative for activity change, appetite change, fatigue, fever and unexpected weight change.  HENT:  Negative for congestion, ear pain, rhinorrhea, sinus pressure and sore throat.   Eyes:  Positive for pain, discharge and redness. Negative for photophobia, itching and visual disturbance.  Respiratory:  Negative for cough, shortness of breath and wheezing.   Cardiovascular:  Negative for chest pain and palpitations.  Gastrointestinal:  Negative for abdominal pain, blood in stool, constipation and diarrhea.  Endocrine:  Negative for polydipsia and polyuria.  Genitourinary:  Negative for dysuria, frequency and urgency.  Musculoskeletal:  Negative for arthralgias, back pain and myalgias.  Skin:  Negative for pallor and rash.  Allergic/Immunologic: Negative for environmental allergies.  Neurological:  Negative for dizziness, syncope and headaches.  Hematological:  Negative for adenopathy. Does not bruise/bleed  easily.  Psychiatric/Behavioral:  Negative for decreased concentration and dysphoric mood. The patient is not nervous/anxious.        Objective:   Physical Exam Constitutional:      General: She is not in acute distress.    Appearance: Normal appearance. She is normal weight. She is not ill-appearing or diaphoretic.  HENT:     Head: Normocephalic and atraumatic.     Right Ear: Tympanic membrane and ear canal normal.     Left Ear: Tympanic membrane and ear canal normal.     Nose: Nose normal.     Mouth/Throat:     Mouth: Mucous membranes are moist.     Pharynx: Oropharynx is clear.  Eyes:     General: No scleral icterus.       Left eye: Hordeolum present.    Extraocular Movements: Extraocular movements intact.     Conjunctiva/sclera: Conjunctivae normal.     Right eye: Right conjunctiva is not injected. No hemorrhage.    Left eye: Left conjunctiva is not injected. No hemorrhage.    Pupils: Pupils are equal, round, and reactive to light.     Comments: 2 small chalazion noted under upper eye lid (mid) -palpable small lumps mildly tender and scant erythema  One larger stye mid lower lid at lash line  Tender Actively draining white colored discharge  Erythema  Mild swelling of cheek below lid    Cardiovascular:     Rate and Rhythm: Normal rate and regular rhythm.     Heart sounds: Normal heart sounds.  Pulmonary:     Effort: Pulmonary effort is normal. No respiratory distress.     Breath sounds: No wheezing.  Musculoskeletal:     Cervical back: Normal range of motion and neck supple. No tenderness.  Lymphadenopathy:     Cervical: No cervical adenopathy.  Skin:    General: Skin is warm and dry.     Findings: No bruising or rash.  Neurological:     Mental Status: She is alert.     Cranial Nerves: No cranial nerve deficit.  Psychiatric:        Mood and Affect: Mood normal.           Assessment & Plan:   Problem List Items Addressed This Visit       Other    Hordeolum externum (stye)    Exam is consistent with style on lower lid (mid) that is inflamed and draining (poss infection) Also 2 small chalazion under upper lid (mid) Recommend warm compress  augmentin px to take bid for a week  Monitor closely -follow up Friday for re check  ER parameters discussed  Hygiene around eye discussed- throw out eye makeup and do not buy new until resolved  Will watch for vision change as well Handout given

## 2022-08-04 NOTE — Assessment & Plan Note (Signed)
Exam is consistent with style on lower lid (mid) that is inflamed and draining (poss infection) Also 2 small chalazion under upper lid (mid) Recommend warm compress  augmentin px to take bid for a week  Monitor closely -follow up Friday for re check  ER parameters discussed  Hygiene around eye discussed- throw out eye makeup and do not buy new until resolved  Will watch for vision change as well Handout given

## 2022-08-07 ENCOUNTER — Other Ambulatory Visit: Payer: Self-pay

## 2022-08-07 ENCOUNTER — Emergency Department
Admission: EM | Admit: 2022-08-07 | Discharge: 2022-08-07 | Disposition: A | Discharge status: Home / Self Care | Payer: Medicare HMO | Attending: Student in an Organized Health Care Education/Training Program | Admitting: Student in an Organized Health Care Education/Training Program

## 2022-08-07 ENCOUNTER — Encounter: Payer: Self-pay | Admitting: Emergency Medicine

## 2022-08-07 DIAGNOSIS — G43809 Other migraine, not intractable, without status migrainosus: Secondary | ICD-10-CM | POA: Diagnosis not present

## 2022-08-07 DIAGNOSIS — E039 Hypothyroidism, unspecified: Secondary | ICD-10-CM | POA: Diagnosis not present

## 2022-08-07 DIAGNOSIS — Z853 Personal history of malignant neoplasm of breast: Secondary | ICD-10-CM | POA: Diagnosis not present

## 2022-08-07 DIAGNOSIS — R519 Headache, unspecified: Secondary | ICD-10-CM | POA: Diagnosis present

## 2022-08-07 LAB — CBC
HCT: 39.6 % (ref 36.0–46.0)
Hemoglobin: 13.3 g/dL (ref 12.0–15.0)
MCH: 30.7 pg (ref 26.0–34.0)
MCHC: 33.6 g/dL (ref 30.0–36.0)
MCV: 91.5 fL (ref 80.0–100.0)
Platelets: 314 10*3/uL (ref 150–400)
RBC: 4.33 MIL/uL (ref 3.87–5.11)
RDW: 12.5 % (ref 11.5–15.5)
WBC: 8.7 10*3/uL (ref 4.0–10.5)
nRBC: 0 % (ref 0.0–0.2)

## 2022-08-07 LAB — BASIC METABOLIC PANEL
Anion gap: 8 (ref 5–15)
BUN: 22 mg/dL (ref 8–23)
CO2: 27 mmol/L (ref 22–32)
Calcium: 9.6 mg/dL (ref 8.9–10.3)
Chloride: 103 mmol/L (ref 98–111)
Creatinine, Ser: 0.64 mg/dL (ref 0.44–1.00)
GFR, Estimated: >60 mL/min (ref >60)
Glucose, Bld: 104 mg/dL — ABNORMAL HIGH (ref 70–99)
Potassium: 3.7 mmol/L (ref 3.5–5.1)
Sodium: 138 mmol/L (ref 135–145)

## 2022-08-07 MED ORDER — ONDANSETRON HCL 4 MG/2ML IJ SOLN
4.0000 mg | Freq: Once | INTRAMUSCULAR | Status: AC
  Administered 2022-08-07: 4 mg via INTRAVENOUS
  Filled 2022-08-07: qty 2

## 2022-08-07 MED ORDER — SODIUM CHLORIDE 0.9 % IV BOLUS
1000.0000 mL | Freq: Once | INTRAVENOUS | Status: AC
  Administered 2022-08-07: 1000 mL via INTRAVENOUS

## 2022-08-07 MED ORDER — METHYLPREDNISOLONE SODIUM SUCC 125 MG IJ SOLR
125.0000 mg | Freq: Once | INTRAMUSCULAR | Status: AC
  Administered 2022-08-07: 125 mg via INTRAVENOUS
  Filled 2022-08-07: qty 2

## 2022-08-07 MED ORDER — KETOROLAC TROMETHAMINE 15 MG/ML IJ SOLN
15.0000 mg | Freq: Once | INTRAMUSCULAR | Status: AC
  Administered 2022-08-07: 15 mg via INTRAVENOUS
  Filled 2022-08-07: qty 1

## 2022-08-07 MED ORDER — PREDNISONE 10 MG PO TABS: ORAL_TABLET | ORAL | 0 refills | Status: DC

## 2022-08-07 NOTE — Discharge Instructions (Signed)
Please follow-up with your neurologist. You were given your prednisone taper as requested.  Please return if your headache is not the same as her initial migraine headaches, or any new, worsening, or change in symptoms or other concerns.  It was a pleasure caring for you today.

## 2022-08-07 NOTE — ED Notes (Signed)
See triage note  Presents with migraine headache which started several days ago  States she did have some relief yesterday but then pain returned

## 2022-08-07 NOTE — ED Provider Notes (Signed)
[facility] Provider Note    Event Date/Time   First MD Initiated Contact with Patient 08/07/22 [id number]     (approximate)   History   Migraine   HPI  Jennifer [name] is a 66 y.o. female with a past medical history of migraine headaches who presents today for evaluation of headache.  Patient reports that this feels exactly the same as her previous migraine headaches.  She reports that she gets 7-9 of these per month and today it feels exactly the same.  She reports that it started gradually.  She reports that her headache is unilateral and she has light sensitivity.  She has not had any nausea or vomiting.  She reports that she has taken her triptan's at home, but feels that she has not had significant improvement.  She reports that normally when this happens she comes to the emergency department for her migraine cocktail which she says is Toradol, Solu-Medrol, and Zofran.  This is what she is requesting today.  She also reports that she normally gets a prednisone taper afterwards with 80 mg, 60 mg, 40 mg, and 20 mg.  She reports that she called her neurologist today and has scheduled an appointment with them.  He was unable to see her in the office today which is why she came to the emergency department.  She denies any trauma to her head.  She reports that her headache is exactly the same as her previous migraine headaches.  No vision changes, vomiting, neck pain, numbness, tingling, paresthesias.  Patient Active Problem List   Diagnosis Date Noted   Hordeolum externum (stye) 08/04/2022   Raynaud's disease 03/17/2022   Left facial swelling 01/25/2022   Left ear pain 01/23/2022   Acute cystitis with hematuria 11/26/2021   Right leg pain 10/15/2021   Microcytic anemia 07/07/2021   De Quervain's tenosynovitis, right 04/18/2021   Iron deficiency anemia 09/08/2020   Common bile duct dilatation 09/06/2020   Opacity of lung on imaging study 09/06/2020   Dizziness  07/10/2020   History of breast cancer 03/01/2020   Genetic testing 11/14/2019   Bilateral hand pain 11/03/2019   Family history of prostate cancer    Adnexal cyst 09/26/2019   Family history of breast cancer    Malignant neoplasm of upper-inner quadrant of right breast in female, estrogen receptor positive ([place]) 09/20/2018   B12 deficiency 04/13/2018   Joint pain 04/11/2018   Colon cancer screening 09/16/2017   Prediabetes 07/28/2016   Allergic rhinitis 11/13/2014   Screening for lipoid disorders 09/26/2014   Preventative health care 09/26/2014   Dysuria 08/07/2014   Routine general medical examination at a health care facility 10/28/2012   GERD 09/16/2010   Hypothyroidism 05/07/2008   Osteopenia 05/07/2008   Migraine with aura 11/11/2007          Physical Exam   Triage Vital Signs: ED Triage Vitals [08/07/22 0940]  Enc Vitals Group     BP (!) 111/99     Pulse Rate 71     Resp 18     Temp 98.1 F (36.7 C)     Temp Source Oral     SpO2 100 %     Weight 132 lb 0.9 oz (59.9 kg)     Height '5\' 1"'$  (1.549 m)     Head Circumference      Peak Flow      Pain Score 3     Pain Loc      Pain  Edu?      Excl. in [place]?     Most recent vital signs: Vitals:   08/07/22 0940  BP: (!) 111/99  Pulse: 71  Resp: 18  Temp: 98.1 F (36.7 C)  SpO2: 100%    Physical Exam Vitals and nursing note reviewed.  Constitutional:      General: Awake and alert. No acute distress.    Appearance: Normal appearance. The patient is normal weight.  HENT:     Head: Normocephalic and atraumatic.     Mouth: Mucous membranes are moist.  Eyes:     General: PERRL. Normal EOMs        Right eye: No discharge.        Left eye: No discharge.     Conjunctiva/sclera: Conjunctivae normal.  Cardiovascular:     Rate and Rhythm: Normal rate and regular rhythm.     Pulses: Normal pulses.     Heart sounds: Normal heart sounds Pulmonary:     Effort: Pulmonary effort is normal. No respiratory distress.      Breath sounds: Normal breath sounds.  Abdominal:     Abdomen is soft. There is no abdominal tenderness. No rebound or guarding. No distention. Musculoskeletal:        General: No swelling. Normal range of motion.     Cervical back: Normal range of motion and neck supple.  Skin:    General: Skin is warm and dry.     Capillary Refill: Capillary refill takes less than 2 seconds.     Findings: No rash.  Neurological:     Mental Status: The patient is awake and alert.   Neurological: GCS 15 alert and oriented x3 Normal speech, no expressive or receptive aphasia or dysarthria Cranial nerves II through XII intact Normal visual fields 5 out of 5 strength in all 4 extremities with intact sensation throughout No extremity drift Normal finger-to-nose testing, no limb or truncal ataxia   ED Results / Procedures / Treatments   Labs (all labs ordered are listed, but only abnormal results are displayed) Labs Reviewed  BASIC METABOLIC PANEL - Abnormal; Notable for the following components:      Result Value   Glucose, Bld 104 (*)    All other components within normal limits  CBC     EKG     RADIOLOGY     PROCEDURES:  Critical Care performed:   Procedures   MEDICATIONS ORDERED IN ED: Medications  ketorolac (TORADOL) 15 MG/ML injection 15 mg (15 mg Intravenous Given 08/07/22 1029)  ondansetron (ZOFRAN) injection 4 mg (4 mg Intravenous Given 08/07/22 1029)  methylPREDNISolone sodium succinate (SOLU-MEDROL) 125 mg/2 mL injection 125 mg (125 mg Intravenous Given 08/07/22 1029)  sodium chloride 0.9 % bolus 1,000 mL (0 mLs Intravenous Stopped 08/07/22 1128)     IMPRESSION / MDM / ASSESSMENT AND PLAN / ED COURSE  I reviewed the triage vital signs and the nursing notes.   Differential diagnosis includes, but is not limited to, migraine, tension headache, cluster headache.  Her headache was gradual in onset, without history or physical exam findings to suggest encephalopathy;  no altered mental status, fever or meningismus, vision changes, vomiting or focal neurological deficit and improved with treatment in the emergency department. Therefore, I have low suspicion for concerning process that would require urgent or emergent imaging or diagnostic/therapeutic procedural intervention such as lumbar puncture. Doubt meningitis as there is no fever, photophobia, neck symptoms, altered mental status. Additionally the patient is not known to be immunocompromised.  No history of trauma, doubt subdural or epidural hematoma. No dizziness or other neurologic symptoms so cerebellar infarction or other hemorrhagic stroke are unlikely. Intracranial mass unlikely given that the headache is not getting progressively worse, is not worse in the morning, there are no other neurologic symptoms, and the neurologic exam is grossly normal. Unlikely to be giant cell arteritis as there is no tenderness over temporal artery or vision changes. Doubt CO toxicity as no known exposure and no other family members have a headache. No neck pain and was not sudden onset or associated with movement of the neck and no dizziness or double vision or recent neck manipulation,  doubt carotid artery dissection. No occipital tenderness so occipital neuralgia seems less likely.  Patient feels quite certain that her pain feels exactly the same as her previous migraine headaches.  She does not wish to have labs or imaging.  She is requesting a specific migraine cocktail which was provided to her.  Upon reevaluation, she reports that her pain is resolved completely.  She is requesting a prednisone taper of 80 mg, 60 mg, 40 mg, 20 mg.  She reports that she gets each time, therefore this was also provided.  Return precautions discussed, patient to follow-up closely with outpatient provider.  We did discuss return precautions, patient understands and agrees.  Discharged in stable condition.    Patient's presentation is most  consistent with severe exacerbation of chronic illness.   Clinical Course as of 08/07/22 1234  Fri Aug 07, 2022  1106 Reports that her headache has resolved and she feels ready for discharge [JP]    Clinical Course User Index [JP] Cerina Leary, Clarnce Flock, PA-C     FINAL CLINICAL IMPRESSION(S) / ED DIAGNOSES   Final diagnoses:  Other migraine without status migrainosus, not intractable     Rx / DC Orders   ED Discharge Orders          Ordered    predniSONE (DELTASONE) 10 MG tablet        08/07/22 1109             Note:  This document was prepared using Dragon voice recognition software and may include unintentional dictation errors.   Emeline Gins 08/07/22 1234    Merlyn Lot, MD 08/07/22 1258

## 2022-08-07 NOTE — ED Triage Notes (Signed)
Pt here with migraines, hx of same. Pt states she took her medication with no relief. Pt ambulatory to triage.

## 2022-08-12 ENCOUNTER — Encounter: Payer: Self-pay | Admitting: Orthopaedic Surgery

## 2022-08-12 ENCOUNTER — Ambulatory Visit: Payer: Medicare HMO | Admitting: Orthopaedic Surgery

## 2022-08-12 DIAGNOSIS — M25511 Pain in right shoulder: Secondary | ICD-10-CM | POA: Diagnosis not present

## 2022-08-12 NOTE — Progress Notes (Signed)
Office Visit Note   Patient: Jennifer [name]           Date of Birth: [birth year]           MRN: [id number] Visit Date: 08/12/2022              Requested by: Tower, [name], MD [place],  [place] [id number] PCP: Abner Greenspan, MD   Assessment & Plan: Visit Diagnoses:  1. Acute pain of right shoulder     Plan: [name] chronic history of migraine headaches being followed by the neurology service.  In the past she has had treatment modalities including cortisone injections, Botox injections and physical therapy.  She recently was seen by the physical therapist and has been receiving dry needling and deep massage.  After her recent massage treatment she experienced considerable pain in the posterior aspect of her right shoulder.  She initially was having pain in the area of 9-10 but relates presently it is probably only a "1".  She seems to have areas of trigger point tenderness specifically about the levator scapular muscle.  She has had a prior history of rotator cuff tear repair performed in [place] in 2016.  I do not think her present problem is related to the rotator cuff as she had negative impingement testing, negative empty can testing and no pain about the anterior lateral aspect of her shoulder.  She is obviously feeling a lot better I think she is fine to return to the therapy eliminating the deep massage.  She may continue with the dry needling as it seems to have made a difference.  I have encouraged her to return if she has any further problems or questions I do not think further diagnostic testing is necessary at this point.  She will also continue to be followed by the neurologist for her migraines.  Could try some Voltaren gel to the localized area of tenderness about the trapezius muscle  Follow-Up Instructions: Return if symptoms worsen or fail to improve.   Orders:  No orders of the defined types were placed in this encounter.  No orders of the defined  types were placed in this encounter.     Procedures: No procedures performed   Clinical Data: No additional findings.   Subjective: Chief Complaint  Patient presents with   Right Shoulder - Pain  Long history of chronic migraine headaches.  Being followed by the neurologist.  Has had some success with Botox, cortisone injections or even prednisone tapering.  Recently was seen by the physical therapist with deep massage to the posterior shoulder musculature with onset of significant pain in the area of the right scapula.  Seen in the emergency room with negative evaluation for acute injury.  She is feeling much better now but had multiple questions about ongoing treatment.  She has had a prior rotator cuff tear repair in 2016 performed in [place]  HPI  Review of Systems   Objective: Vital Signs: There were no vitals taken for this visit.  Physical Exam Constitutional:      Appearance: She is well-developed.  Eyes:     Pupils: Pupils are equal, round, and reactive to light.  Pulmonary:     Effort: Pulmonary effort is normal.  Skin:    General: Skin is warm and dry.  Neurological:     Mental Status: She is alert and oriented to person, place, and time.  Psychiatric:  Behavior: Behavior normal.     Ortho Exam awake alert and oriented x3.  Comfortable sitting.  Negative impingement testing right shoulder.  Negative empty can and speeds sign.  Good strength with internal/external rotation.  No ecchymosis.  No weakness with abduction.  Full range of motion in flexion and internal rotation.  Neurologically intact.  Does have tenderness over the right trapezius muscle.  No pain along the vertebral border of the scapula or the infra or supraspinatus muscles.  Specialty Comments:  No specialty comments available.  Imaging: No results found.   PMFS History: Patient Active Problem List   Diagnosis Date Noted   Pain in right shoulder 08/12/2022   Hordeolum externum  (stye) 08/04/2022   Raynaud's disease 03/17/2022   Left facial swelling 01/25/2022   Left ear pain 01/23/2022   Acute cystitis with hematuria 11/26/2021   Right leg pain 10/15/2021   Microcytic anemia 07/07/2021   De Quervain's tenosynovitis, right 04/18/2021   Iron deficiency anemia 09/08/2020   Common bile duct dilatation 09/06/2020   Opacity of lung on imaging study 09/06/2020   Dizziness 07/10/2020   History of breast cancer 03/01/2020   Genetic testing 11/14/2019   Bilateral hand pain 11/03/2019   Family history of prostate cancer    Adnexal cyst 09/26/2019   Family history of breast cancer    Malignant neoplasm of upper-inner quadrant of right breast in female, estrogen receptor positive ([place]) 09/20/2018   B12 deficiency 04/13/2018   Joint pain 04/11/2018   Colon cancer screening 09/16/2017   Prediabetes 07/28/2016   Allergic rhinitis 11/13/2014   Screening for lipoid disorders 09/26/2014   Preventative health care 09/26/2014   Dysuria 08/07/2014   Routine general medical examination at a health care facility 10/28/2012   GERD 09/16/2010   Hypothyroidism 05/07/2008   Osteopenia 05/07/2008   Migraine with aura 11/11/2007   Past Medical History:  Diagnosis Date   Allergic rhinitis    Anxiety    Arthritis    hands, knees   Breast cancer ([place]) 2019   Right Breast Cancer   Cancer ([place]) 09/2018   Breast CA new DX, right breast   Cervical dysplasia    Common bile duct dilation    Diabetes mellitus without complication (HCC)    Endometriosis    Esophageal reflux    Family history of adverse reaction to anesthesia    younger sister anxiety for a dew days after   Family history of breast cancer    Family history of breast cancer    Family history of prostate cancer    Hepatic cyst 08/2020   multiple   Hepatic hemangioma 08/2020   intrahepatic hemangioma   Hiatal hernia    Hypertension    Hypothyroid    Migraine with aura, without mention of intractable migraine  without mention of status migrainosus    Osteopenia 08/2019   T score -2.2 FRAX 11% / 1.7%   Personal history of radiation therapy 2019   Right Breast Cancer   Pre-diabetes     Family History  Problem Relation Age of Onset   Breast cancer Mother 20   Diabetes Mother    Hypertension Father    Heart disease Father    Hypertension Sister    Anuerysm Sister        head   Hypertension Sister    Anxiety disorder Sister    Other Sister        prediabetes   Osteoporosis Maternal Grandmother    Diabetes Maternal  Grandmother    COPD Paternal Grandmother    Heart disease Paternal Grandfather    Breast cancer Cousin 27       pat first cousin   Breast cancer Cousin        mat second cousin with breast cancer in her 37s   Thyroid cancer Cousin 21       pat first cousin   Prostate cancer Other 56   Colon cancer Neg Hx    Stomach cancer Neg Hx    Esophageal cancer Neg Hx    Pancreatic cancer Neg Hx    Rectal cancer Neg Hx     Past Surgical History:  Procedure Laterality Date   BIOPSY  09/12/2020   Procedure: BIOPSY;  Surgeon: [name], MD;  Location: [facility];  Service: Endoscopy;;   BREAST EXCISIONAL BIOPSY Bilateral over 10 years ago   benign x 2    BREAST LUMPECTOMY Right 10/10/2018   BREAST LUMPECTOMY WITH RADIOACTIVE SEED AND SENTINEL LYMPH NODE BIOPSY Right 10/10/2018   Procedure: RIGHT BREAST LUMPECTOMY WITH RADIOACTIVE SEED AND RIGHT SENTINEL LYMPH NODE BIOPSY;  Surgeon: [name], MD;  Location: [facility];  Service: General;  Laterality: Right;   BREAST SURGERY     Benign breast lump excised   CERVICAL CONE BIOPSY  1985   severe dysplasia   COLONOSCOPY  2005 and dec 2019   normal   COLPOSCOPY     endoscopy  10/2018   ESOPHAGOGASTRODUODENOSCOPY (EGD) WITH PROPOFOL N/A 09/12/2020   Procedure: ESOPHAGOGASTRODUODENOSCOPY (EGD) WITH PROPOFOL;  Surgeon: [name], MD;  Location: [facility];  Service: Endoscopy;  Laterality: N/A;    EUS N/A 09/12/2020   Procedure: UPPER ENDOSCOPIC ULTRASOUND (EUS) RADIAL;  Surgeon: [name], MD;  Location: [facility];  Service: Endoscopy;  Laterality: N/A;   LAPAROSCOPIC ASSISTED VAGINAL HYSTERECTOMY     LAPAROSCOPIC BILATERAL SALPINGO OOPHERECTOMY Bilateral 10/27/2019   Procedure: LAPAROSCOPIC BILATERAL SALPINGO OOPHORECTOMY WITH PERITONEAL WASHINGS;  Surgeon: Princess Bruins, MD;  Location: [facility];  Service: Gynecology;  Laterality: Bilateral;  request 1:00pm on Friday, Dec. 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  Chart creation errors have been sought, but may not always  have been located. Such creation errors do not reflect on  the standard of medical care.

## 2022-08-15 ENCOUNTER — Other Ambulatory Visit: Payer: Self-pay | Admitting: Family Medicine

## 2022-08-17 ENCOUNTER — Other Ambulatory Visit: Payer: Self-pay | Admitting: Family Medicine

## 2022-08-17 MED ORDER — ALPRAZOLAM 0.5 MG PO TABS: ORAL_TABLET | ORAL | 2 refills | Status: DC

## 2022-08-17 NOTE — Telephone Encounter (Signed)
Caller Name: britnie colville  Call back phone #: [phone]  MEDICATION(S):  ALPRAZolam Duanne Moron) 0.5 MG tablet  Days of Med Remaining: 0  Has the patient contacted their pharmacy (YES/NO)? NO What did pharmacy advise?   [facility] [place]  ~~~Please advise patient/caregiver to allow 2-3 business days to process RX refills.

## 2022-08-17 NOTE — Telephone Encounter (Signed)
Pt hasn't had a recent TSH lab in over a year, she has had recent acute appts., please advise

## 2022-08-17 NOTE — Telephone Encounter (Signed)
Name of Medication: Xanax  Name [facility] [place] or Written Date and Quantity: 06/05/22 #15 tab/ 3 refills  Last Office Visit and Type: 08/04/22 eye problems Next Office Visit and Type: none scheduled

## 2022-08-17 NOTE — Telephone Encounter (Signed)
Please schedule lab for TSH and refill until then  Thanks

## 2022-08-18 NOTE — Telephone Encounter (Signed)
Called patient set up for lab appointment. She does not need refills until after she has lab. Will decline refill until labs have been received.

## 2022-08-19 DIAGNOSIS — M542 Cervicalgia: Secondary | ICD-10-CM | POA: Diagnosis not present

## 2022-08-19 DIAGNOSIS — G43719 Chronic migraine without aura, intractable, without status migrainosus: Secondary | ICD-10-CM | POA: Diagnosis not present

## 2022-08-19 DIAGNOSIS — M791 Myalgia, unspecified site: Secondary | ICD-10-CM | POA: Diagnosis not present

## 2022-08-19 DIAGNOSIS — G518 Other disorders of facial nerve: Secondary | ICD-10-CM | POA: Diagnosis not present

## 2022-08-24 ENCOUNTER — Telehealth: Payer: Self-pay | Admitting: *Deleted

## 2022-08-24 ENCOUNTER — Telehealth: Payer: Self-pay | Admitting: Family Medicine

## 2022-08-24 DIAGNOSIS — E039 Hypothyroidism, unspecified: Secondary | ICD-10-CM

## 2022-08-24 DIAGNOSIS — R7303 Prediabetes: Secondary | ICD-10-CM

## 2022-08-24 NOTE — Telephone Encounter (Signed)
-----   Message from Ellamae Sia sent at 08/19/2022  3:12 PM EDT ----- Regarding: Lab orders for Tuesday, 10.3.23 Lab orders, tsh? thanks

## 2022-08-24 NOTE — Telephone Encounter (Signed)
Received call from pt requesting advice from MD if okay to proceed with Cyroskin weight loss therapy with hx of breast cancer and right lumpectomy.  Per MD okay to proceed.  Pt educated and verbalized understanding.

## 2022-08-25 ENCOUNTER — Other Ambulatory Visit (INDEPENDENT_AMBULATORY_CARE_PROVIDER_SITE_OTHER): Payer: Medicare HMO

## 2022-08-25 DIAGNOSIS — R7303 Prediabetes: Secondary | ICD-10-CM

## 2022-08-25 DIAGNOSIS — E039 Hypothyroidism, unspecified: Secondary | ICD-10-CM

## 2022-08-25 LAB — HEMOGLOBIN A1C: Hgb A1c MFr Bld: 6 % (ref 4.6–6.5)

## 2022-08-25 LAB — TSH: TSH: 2.53 u[IU]/mL (ref 0.35–5.50)

## 2022-08-26 DIAGNOSIS — M5459 Other low back pain: Secondary | ICD-10-CM | POA: Diagnosis not present

## 2022-08-27 DIAGNOSIS — H0015 Chalazion left lower eyelid: Secondary | ICD-10-CM | POA: Diagnosis not present

## 2022-09-02 ENCOUNTER — Other Ambulatory Visit: Payer: Self-pay | Admitting: Hematology and Oncology

## 2022-09-03 DIAGNOSIS — G518 Other disorders of facial nerve: Secondary | ICD-10-CM | POA: Diagnosis not present

## 2022-09-03 DIAGNOSIS — M542 Cervicalgia: Secondary | ICD-10-CM | POA: Diagnosis not present

## 2022-09-03 DIAGNOSIS — G43719 Chronic migraine without aura, intractable, without status migrainosus: Secondary | ICD-10-CM | POA: Diagnosis not present

## 2022-09-03 DIAGNOSIS — M791 Myalgia, unspecified site: Secondary | ICD-10-CM | POA: Diagnosis not present

## 2022-09-04 ENCOUNTER — Telehealth: Payer: Self-pay | Admitting: Family Medicine

## 2022-09-04 MED ORDER — LEVOTHYROXINE SODIUM 75 MCG PO TABS: ORAL_TABLET | ORAL | 1 refills | Status: DC

## 2022-09-04 NOTE — Telephone Encounter (Signed)
Caller Name: lily Call back phone #: [phone]  MEDICATION(S):  levothyroxine (SYNTHROID) 75 MCG tablet  Days of Med Remaining: 0  Has the patient contacted their pharmacy (YES/NO)? NO What did pharmacy advise?   [facility] [place]  ~~~Please advise patient/caregiver to allow 2-3 business days to process RX refills.

## 2022-09-16 ENCOUNTER — Encounter: Payer: Self-pay | Admitting: Gastroenterology

## 2022-09-18 ENCOUNTER — Other Ambulatory Visit: Payer: Self-pay | Admitting: Obstetrics & Gynecology

## 2022-09-18 DIAGNOSIS — Z1231 Encounter for screening mammogram for malignant neoplasm of breast: Secondary | ICD-10-CM

## 2022-09-23 ENCOUNTER — Other Ambulatory Visit: Payer: Self-pay | Admitting: Family Medicine

## 2022-09-23 DIAGNOSIS — M791 Myalgia, unspecified site: Secondary | ICD-10-CM | POA: Diagnosis not present

## 2022-09-23 DIAGNOSIS — G43719 Chronic migraine without aura, intractable, without status migrainosus: Secondary | ICD-10-CM | POA: Diagnosis not present

## 2022-09-23 DIAGNOSIS — G518 Other disorders of facial nerve: Secondary | ICD-10-CM | POA: Diagnosis not present

## 2022-09-23 DIAGNOSIS — M542 Cervicalgia: Secondary | ICD-10-CM | POA: Diagnosis not present

## 2022-09-24 ENCOUNTER — Other Ambulatory Visit: Payer: Self-pay | Admitting: Obstetrics & Gynecology

## 2022-09-24 DIAGNOSIS — Z1231 Encounter for screening mammogram for malignant neoplasm of breast: Secondary | ICD-10-CM

## 2022-09-24 DIAGNOSIS — Z9889 Other specified postprocedural states: Secondary | ICD-10-CM

## 2022-10-08 ENCOUNTER — Telehealth: Payer: Self-pay

## 2022-10-08 NOTE — Telephone Encounter (Signed)
Patient called in stating she received a phone call from our office & was unsure what she needed to get scheduled. Recall EGD letter was sent out on 09/16/22. She's been scheduled for PV on 10/19/22 at 10:30 am & EGD on 11/18/22 at 10:00 am with Dr. Tarri Glenn in the [facility]. No blood thinners, patient is only on metformin for diabetes.

## 2022-10-12 ENCOUNTER — Other Ambulatory Visit: Payer: Self-pay

## 2022-10-12 ENCOUNTER — Emergency Department
Admission: EM | Admit: 2022-10-12 | Discharge: 2022-10-12 | Disposition: A | Discharge status: Home / Self Care | Payer: Medicare HMO | Attending: Emergency Medicine | Admitting: Emergency Medicine

## 2022-10-12 DIAGNOSIS — G518 Other disorders of facial nerve: Secondary | ICD-10-CM | POA: Diagnosis not present

## 2022-10-12 DIAGNOSIS — G43719 Chronic migraine without aura, intractable, without status migrainosus: Secondary | ICD-10-CM | POA: Diagnosis not present

## 2022-10-12 DIAGNOSIS — G43001 Migraine without aura, not intractable, with status migrainosus: Secondary | ICD-10-CM | POA: Insufficient documentation

## 2022-10-12 DIAGNOSIS — M542 Cervicalgia: Secondary | ICD-10-CM | POA: Diagnosis not present

## 2022-10-12 DIAGNOSIS — I10 Essential (primary) hypertension: Secondary | ICD-10-CM | POA: Diagnosis not present

## 2022-10-12 DIAGNOSIS — M791 Myalgia, unspecified site: Secondary | ICD-10-CM | POA: Diagnosis not present

## 2022-10-12 DIAGNOSIS — R519 Headache, unspecified: Secondary | ICD-10-CM | POA: Diagnosis present

## 2022-10-12 DIAGNOSIS — G43009 Migraine without aura, not intractable, without status migrainosus: Secondary | ICD-10-CM | POA: Diagnosis not present

## 2022-10-12 LAB — CBC WITH DIFFERENTIAL/PLATELET
Abs Immature Granulocytes: 0.01 10*3/uL (ref 0.00–0.07)
Basophils Absolute: 0 10*3/uL (ref 0.0–0.1)
Basophils Relative: 0 %
Eosinophils Absolute: 0 10*3/uL (ref 0.0–0.5)
Eosinophils Relative: 0 %
HCT: 37.3 % (ref 36.0–46.0)
Hemoglobin: 13.1 g/dL (ref 12.0–15.0)
Immature Granulocytes: 0 %
Lymphocytes Relative: 16 %
Lymphs Abs: 1.2 10*3/uL (ref 0.7–4.0)
MCH: 30.8 pg (ref 26.0–34.0)
MCHC: 35.1 g/dL (ref 30.0–36.0)
MCV: 87.6 fL (ref 80.0–100.0)
Monocytes Absolute: 0.3 10*3/uL (ref 0.1–1.0)
Monocytes Relative: 4 %
Neutro Abs: 5.9 10*3/uL (ref 1.7–7.7)
Neutrophils Relative %: 80 %
Platelets: 320 10*3/uL (ref 150–400)
RBC: 4.26 MIL/uL (ref 3.87–5.11)
RDW: 11.3 % — ABNORMAL LOW (ref 11.5–15.5)
WBC: 7.5 10*3/uL (ref 4.0–10.5)
nRBC: 0 % (ref 0.0–0.2)

## 2022-10-12 LAB — BASIC METABOLIC PANEL
Anion gap: 12 (ref 5–15)
BUN: 17 mg/dL (ref 8–23)
CO2: 26 mmol/L (ref 22–32)
Calcium: 9.1 mg/dL (ref 8.9–10.3)
Chloride: 94 mmol/L — ABNORMAL LOW (ref 98–111)
Creatinine, Ser: 0.52 mg/dL (ref 0.44–1.00)
GFR, Estimated: >60 mL/min (ref >60)
Glucose, Bld: 135 mg/dL — ABNORMAL HIGH (ref 70–99)
Potassium: 3.3 mmol/L — ABNORMAL LOW (ref 3.5–5.1)
Sodium: 132 mmol/L — ABNORMAL LOW (ref 135–145)

## 2022-10-12 MED ORDER — PROCHLORPERAZINE EDISYLATE 10 MG/2ML IJ SOLN
5.0000 mg | Freq: Once | INTRAMUSCULAR | Status: DC
  Filled 2022-10-12: qty 2

## 2022-10-12 MED ORDER — PREDNISONE 20 MG PO TABS: 60.0000 mg | ORAL_TABLET | Freq: Every day | ORAL | 0 refills | Status: DC

## 2022-10-12 MED ORDER — KETOROLAC TROMETHAMINE 30 MG/ML IJ SOLN
30.0000 mg | Freq: Once | INTRAMUSCULAR | Status: AC
Start: 2022-10-12 — End: 2022-10-12
  Administered 2022-10-12: 30 mg via INTRAVENOUS
  Filled 2022-10-12: qty 1

## 2022-10-12 MED ORDER — METHYLPREDNISOLONE SODIUM SUCC 125 MG IJ SOLR
125.0000 mg | Freq: Once | INTRAMUSCULAR | Status: AC
  Administered 2022-10-12: 125 mg via INTRAVENOUS
  Filled 2022-10-12: qty 2

## 2022-10-12 MED ORDER — LACTATED RINGERS IV BOLUS
1000.0000 mL | Freq: Once | INTRAVENOUS | Status: AC
  Administered 2022-10-12: 1000 mL via INTRAVENOUS

## 2022-10-12 MED ORDER — ONDANSETRON HCL 4 MG/2ML IJ SOLN
4.0000 mg | Freq: Once | INTRAMUSCULAR | Status: AC
  Administered 2022-10-12: 4 mg via INTRAVENOUS
  Filled 2022-10-12: qty 2

## 2022-10-12 NOTE — Discharge Instructions (Signed)
Please return as needed.  Going home and taking her Phenergan as you suggested is a good idea.  Please follow-up with your regular doctor and see if there is any need to increase your migraine prophylactic medicine.

## 2022-10-12 NOTE — ED Provider Notes (Signed)
[facility] Provider Note    Event Date/Time   First MD Initiated Contact with Patient 10/12/22 [phone]     (approximate)   History   Migraine   HPI  Jennifer [name] is a 66 y.o. female who comes in complaining of the onset of her usual bad migraine this 1 has been in process for a while so has gotten worse than usual but she has had headaches like this before.  Bright light bothers her eyes.  She does not have aura with her migraines.  She does not have any numbness or weakness or dizziness.      Physical Exam   Triage Vital Signs: ED Triage Vitals  Enc Vitals Group     BP 10/12/22 0037 (!) 163/78     Pulse Rate 10/12/22 0037 70     Resp 10/12/22 0037 19     Temp 10/12/22 0037 98.5 F (36.9 C)     Temp Source 10/12/22 0037 Oral     SpO2 10/12/22 0037 97 %     Weight 10/12/22 0038 135 lb (61.2 kg)     Height 10/12/22 0038 '5\' 1"'$  (1.549 m)     Head Circumference --      Peak Flow --      Pain Score 10/12/22 0037 9     Pain Loc --      Pain Edu? --      Excl. in [place]? --     Most recent vital signs: Vitals:   10/12/22 0037  BP: (!) 163/78  Pulse: 70  Resp: 19  Temp: 98.5 F (36.9 C)  SpO2: 97%     General: Awake, no distress.  CV:  Good peripheral perfusion.  Heart regular rate and rhythm no audible murmurs Resp:  Normal effort.  Abd:  No distention.  Patient moving all her extremities equally and well no focal neurodeficits   ED Results / Procedures / Treatments   Labs (all labs ordered are listed, but only abnormal results are displayed) Labs Reviewed  CBC WITH DIFFERENTIAL/PLATELET - Abnormal; Notable for the following components:      Result Value   RDW 11.3 (*)    All other components within normal limits  BASIC METABOLIC PANEL - Abnormal; Notable for the following components:   Sodium 132 (*)    Potassium 3.3 (*)    Chloride 94 (*)    Glucose, Bld 135 (*)    All other components within normal limits     EKG  EKG  read and interpreted by me shows normal sinus rhythm rate of 67 normal axis there are flipped T waves inferiorly and in V3 these changes were present on previous EKG   RADIOLOGY    PROCEDURES:  Critical Care performed:   Procedures   MEDICATIONS ORDERED IN ED: Medications  prochlorperazine (COMPAZINE) injection 5 mg (has no administration in time range)  lactated ringers bolus 1,000 mL (1,000 mLs Intravenous New Bag/Given 10/12/22 0256)  ketorolac (TORADOL) 30 MG/ML injection 30 mg (30 mg Intravenous Given 10/12/22 0250)  ondansetron (ZOFRAN) injection 4 mg (4 mg Intravenous Given 10/12/22 0250)  methylPREDNISolone sodium succinate (SOLU-MEDROL) 125 mg/2 mL injection 125 mg (125 mg Intravenous Given 10/12/22 0251)     IMPRESSION / MDM / ASSESSMENT AND PLAN / ED COURSE  I reviewed the triage vital signs and the nursing notes. Patient reports she always gets her migraine cocktail of Solu-Medrol and Toradol and Zofran and fluids.  She does not want  anything else. ----------------------------------------- 4:13 AM on 10/12/2022 ----------------------------------------- Offered patient Compazine twice now she does not want anything else.  She will take another liter of fluid which will give her.  She wants to just wait a little bit longer the pain is gradually improving.  ----------------------------------------- 4:30 AM on 10/12/2022 ----------------------------------------- Patient had asked for Compazine and now changed her mind and wants to try to go home and take her Phenergan.  I will let her do this.  We will ask her to follow-up with her regular doctor to see if she needs to increase her prophylactic medication or try anything else. Differential diagnosis includes, but is not limited to, migraine headache, other headache, symptoms do not sound like subarachnoid at this point.  Patient's presentation is most consistent with severe exacerbation of chronic illness.     FINAL  CLINICAL IMPRESSION(S) / ED DIAGNOSES   Final diagnoses:  Migraine without aura and with status migrainosus, not intractable     Rx / DC Orders   ED Discharge Orders     None        Note:  This document was prepared using Dragon voice recognition software and may include unintentional dictation errors.   Nena Polio, MD 10/12/22 [phone]

## 2022-10-12 NOTE — ED Triage Notes (Signed)
Pt arrives with c/o migraine that started Friday. Pt endorses n/v and dizziness. Pt has hx of migraines. Pt denies blurred vision.

## 2022-10-19 ENCOUNTER — Other Ambulatory Visit: Payer: Self-pay | Admitting: Family Medicine

## 2022-10-19 ENCOUNTER — Ambulatory Visit (AMBULATORY_SURGERY_CENTER): Payer: Self-pay | Admitting: *Deleted

## 2022-10-19 VITALS — Ht 61.0 in | Wt 133.2 lb

## 2022-10-19 DIAGNOSIS — D131 Benign neoplasm of stomach: Secondary | ICD-10-CM

## 2022-10-19 MED ORDER — ALPRAZOLAM 0.5 MG PO TABS: ORAL_TABLET | ORAL | 2 refills | Status: DC

## 2022-10-19 NOTE — Telephone Encounter (Signed)
  Encourage patient to contact the pharmacy for refills or they can request refills through [facility] [place]  Did the patient contact the pharmacy: yes    LAST APPOINTMENT DATE:  08/04/22  NEXT APPOINTMENT DATE:  MEDICATION:ALPRAZolam (XANAX) 0.5 MG tablet   Is the patient out of medication? yes  If not, how much is left? none  Is this a 90 day supply: 15  [id number] [place] [place]: [phone] Fax: [phone]     Let patient know to contact pharmacy at the end of the day to make sure medication is ready.  Please notify patient to allow 48-72 hours to process

## 2022-10-19 NOTE — Telephone Encounter (Signed)
Name of Medication: Xanax  Name of [facility]acy[facility] [place] or Written Date and Quantity: 08/17/22 #15 tab/ 2 refills  Last Office Visit and Type: 08/04/22 eye problems Next Office Visit and Type: none scheduled

## 2022-10-19 NOTE — Progress Notes (Signed)
No egg or soy allergy known to patient  No issues known to pt with past sedation with any surgeries or procedures Patient denies ever being told they had issues or difficulty with intubation  No FH of Malignant Hyperthermia Pt is not on diet pills Pt is not on  home 02  Pt is not on blood thinners  Pt denies issues with constipation  No A fib or A flutter Have any cardiac testing pending--no Pt instructed to use [email or web address] or GoodRx for a price reduction on prep   

## 2022-10-22 ENCOUNTER — Telehealth: Payer: Self-pay | Admitting: Family Medicine

## 2022-10-22 MED ORDER — ALPRAZOLAM 0.5 MG PO TABS: ORAL_TABLET | ORAL | 2 refills | Status: DC

## 2022-10-22 NOTE — Telephone Encounter (Signed)
Pt called in requesting RX ALPRAZolam (XANAX) 0.5 MG table  [facility] [address] . Was unable to pick up RX at [place] # [phone]

## 2022-10-28 ENCOUNTER — Telehealth: Payer: Self-pay

## 2022-10-28 NOTE — Telephone Encounter (Signed)
        Patient  visited [facility] on 10/12/2022  for Migraine without aura, not intractable, with status migrainosus.   Telephone encounter attempt :  1st  A HIPAA compliant voice message was left requesting a return call.  Instructed patient to call back at [phone].   [place] Resource Care Guide   [email or web address]  ?? [phone]   Website: [email or web address]  [email or web address]

## 2022-10-29 ENCOUNTER — Telehealth: Payer: Self-pay

## 2022-10-29 NOTE — Telephone Encounter (Signed)
        Patient  visited [facility] on 10/12/2022  for Migraine without aura, not intractable, with status migrainosus.   Telephone encounter attempt :  2nd  A HIPAA compliant voice message was left requesting a return call.  Instructed patient to call back at [phone].   [place] Resource Care Guide   [email or web address]  ?? [phone]   Website: [email or web address]  [email or web address]

## 2022-11-02 ENCOUNTER — Telehealth: Payer: Self-pay

## 2022-11-02 NOTE — Telephone Encounter (Signed)
        Patient  visited [facility] on 10/12/2022  for Migraine without aura, not intractable, with status migrainosus.   Telephone encounter attempt :  3rd  A HIPAA compliant voice message was left requesting a return call.  Instructed patient to call back at [phone].   [place] Resource Care Guide   [email or web address]  ?? [phone]   Website: [email or web address]  [email or web address]

## 2022-11-09 DIAGNOSIS — G43719 Chronic migraine without aura, intractable, without status migrainosus: Secondary | ICD-10-CM | POA: Diagnosis not present

## 2022-11-09 DIAGNOSIS — M542 Cervicalgia: Secondary | ICD-10-CM | POA: Diagnosis not present

## 2022-11-09 DIAGNOSIS — M791 Myalgia, unspecified site: Secondary | ICD-10-CM | POA: Diagnosis not present

## 2022-11-09 DIAGNOSIS — G518 Other disorders of facial nerve: Secondary | ICD-10-CM | POA: Diagnosis not present

## 2022-11-11 ENCOUNTER — Ambulatory Visit (INDEPENDENT_AMBULATORY_CARE_PROVIDER_SITE_OTHER): Payer: Medicare HMO | Admitting: Dermatology

## 2022-11-11 DIAGNOSIS — L578 Other skin changes due to chronic exposure to nonionizing radiation: Secondary | ICD-10-CM | POA: Diagnosis not present

## 2022-11-11 DIAGNOSIS — L820 Inflamed seborrheic keratosis: Secondary | ICD-10-CM

## 2022-11-11 DIAGNOSIS — L814 Other melanin hyperpigmentation: Secondary | ICD-10-CM | POA: Diagnosis not present

## 2022-11-11 DIAGNOSIS — D229 Melanocytic nevi, unspecified: Secondary | ICD-10-CM | POA: Diagnosis not present

## 2022-11-11 DIAGNOSIS — L821 Other seborrheic keratosis: Secondary | ICD-10-CM | POA: Diagnosis not present

## 2022-11-11 DIAGNOSIS — Z1283 Encounter for screening for malignant neoplasm of skin: Secondary | ICD-10-CM

## 2022-11-11 NOTE — Progress Notes (Signed)
Follow-Up Visit   Subjective  Jennifer [name] is a 66 y.o. female who presents for the following: FBSE (No hx skin cancer. Patient does have a spot at right thigh that she scratches and is irritating her. Patient has been diagnosed with rosacea but it is not bothering her right now. She has noticed some itching at lower face and she thinks it could be related to the water where she lives. ).  The patient presents for Total-Body Skin Exam (TBSE) for skin cancer screening and mole check.  The patient has spots, moles and lesions to be evaluated, some may be new or changing and the patient has concerns that these could be cancer.  Family history of skin cancer - what type(s): BCC - who affected: mother   The following portions of the chart were reviewed this encounter and updated as appropriate:   Tobacco  Allergies  Meds  Problems  Med Hx  Surg Hx  Fam Hx      Review of Systems:  No other skin or systemic complaints except as noted in HPI or Assessment and Plan.  Objective  Well appearing patient in no apparent distress; mood and affect are within normal limits.  A full examination was performed including scalp, head, eyes, ears, nose, lips, neck, chest, axillae, abdomen, back, buttocks, bilateral upper extremities, bilateral lower extremities, hands, feet, fingers, toes, fingernails, and toenails. All findings within normal limits unless otherwise noted below.  Right Thigh Erythematous stuck-on, waxy papule or plaque    Assessment & Plan  Inflamed seborrheic keratosis Right Thigh  Benign-appearing.  Observation.  Call clinic for new or changing lesions.   Symptomatic, irritating, patient would like treated.  Prior to procedure, discussed risks of blister formation, small wound, skin dyspigmentation, or rare scar following cryotherapy. Recommend Vaseline ointment to treated areas while healing.   Destruction of lesion - Right Thigh  Destruction method: cryotherapy    Informed consent: discussed and consent obtained   Lesion destroyed using liquid nitrogen: Yes   Cryotherapy cycles:  2 Outcome: patient tolerated procedure well with no complications   Post-procedure details: wound care instructions given     Lentigines - Scattered tan macules - Due to sun exposure - Benign-appearing, observe - Recommend daily broad spectrum sunscreen SPF 30+ to sun-exposed areas, reapply every 2 hours as needed. - Call for any changes  Seborrheic Keratoses - Stuck-on, waxy, tan-brown papules and/or plaques  - Benign-appearing - Discussed benign etiology and prognosis. - Observe - Call for any changes  Melanocytic Nevi - Tan-brown and/or pink-flesh-colored symmetric macules and papules - Benign appearing on exam today - Observation - Call clinic for new or changing moles - Recommend daily use of broad spectrum spf 30+ sunscreen to sun-exposed areas.   Hemangiomas - Red papules - Discussed benign nature - Observe - Call for any changes  Actinic Damage - Chronic condition, secondary to cumulative UV/sun exposure - diffuse scaly erythematous macules with underlying dyspigmentation - Recommend daily broad spectrum sunscreen SPF 30+ to sun-exposed areas, reapply every 2 hours as needed.  - Staying in the shade or wearing long sleeves, sun glasses (UVA+UVB protection) and wide brim hats (4-inch brim around the entire circumference of the hat) are also recommended for sun protection.  - Call for new or changing lesions.  Skin cancer screening performed today.  Return for TBSE 1-2 years.  Graciella Belton, RMA, am acting as scribe for Forest Gleason, MD .  Documentation: I have reviewed the above documentation for  accuracy and completeness, and I agree with the above.  Forest Gleason, MD

## 2022-11-11 NOTE — Patient Instructions (Addendum)
Cryotherapy Aftercare  Wash gently with soap and water everyday.   Apply Vaseline and Band-Aid daily until healed.   Recommend taking Heliocare sun protection supplement daily in sunny weather for additional sun protection. For maximum protection on the sunniest days, you can take up to 2 capsules of regular Heliocare OR take 1 capsule of Heliocare Ultra. For prolonged exposure (such as a full day in the sun), you can repeat your dose of the supplement 4 hours after your first dose. Heliocare can be purchased at [facility], at some [facility] or at www.heliocare.com.    Melanoma ABCDEs  Melanoma is the most dangerous type of skin cancer, and is the leading cause of death from skin disease.  You are more likely to develop melanoma if you: Have light-colored skin, light-colored eyes, or red or blond hair Spend a lot of time in the sun Tan regularly, either outdoors or in a tanning bed Have had blistering sunburns, especially during childhood Have a close family member who has had a melanoma Have atypical moles or large birthmarks  Early detection of melanoma is key since treatment is typically straightforward and cure rates are extremely high if we catch it early.   The first sign of melanoma is often a change in a mole or a new dark spot.  The ABCDE system is a way of remembering the signs of melanoma.  A for asymmetry:  The two halves do not match. B for border:  The edges of the growth are irregular. C for color:  A mixture of colors are present instead of an even brown color. D for diameter:  Melanomas are usually (but not always) greater than 6mm - the size of a pencil eraser. E for evolution:  The spot keeps changing in size, shape, and color.  Please check your skin once per month between visits. You can use a small mirror in front and a large mirror behind you to keep an eye on the back side or your body.   If you see any new or changing lesions before your next follow-up,  please call to schedule a visit.  Please continue daily skin protection including broad spectrum sunscreen SPF 30+ to sun-exposed areas, reapplying every 2 hours as needed when you're outdoors.    Due to recent changes in healthcare laws, you may see results of your pathology and/or laboratory studies on MyChart before the doctors have had a chance to review them. We understand that in some cases there may be results that are confusing or concerning to you. Please understand that not all results are received at the same time and often the doctors may need to interpret multiple results in order to provide you with the best plan of care or course of treatment. Therefore, we ask that you please give us 2 business days to thoroughly review all your results before contacting the office for clarification. Should we see a critical lab result, you will be contacted sooner.   If You Need Anything After Your Visit  If you have any questions or concerns for your doctor, please call our main line at [phone] and press option 4 to reach your doctor's medical assistant. If no one answers, please leave a voicemail as directed and we will return your call as soon as possible. Messages left after 4 pm will be answered the following business day.   You may also send us a message via MyChart. We typically respond to MyChart messages within 1-2 business days.    For prescription refills, please ask your pharmacy to contact our office. Our fax number is [phone].  If you have an urgent issue when the clinic is closed that cannot wait until the next business day, you can page your doctor at the number below.    Please note that while we do our best to be available for urgent issues outside of office hours, we are not available 24/7.   If you have an urgent issue and are unable to reach us, you may choose to seek medical care at your doctor's office, retail clinic, urgent care center, or emergency room.  If you  have a medical emergency, please immediately call 911 or go to the emergency department.  Pager Numbers  - Dr. Kowalski: [phone]  - Dr. Moye: [phone]  - Dr. Stewart: [phone]  In the event of inclement weather, please call our main line at [phone] for an update on the status of any delays or closures.  Dermatology Medication Tips: Please keep the boxes that topical medications come in in order to help keep track of the instructions about where and how to use these. Pharmacies typically print the medication instructions only on the boxes and not directly on the medication tubes.   If your medication is too expensive, please contact our office at [phone] option 4 or send us a message through MyChart.   We are unable to tell what your co-pay for medications will be in advance as this is different depending on your insurance coverage. However, we may be able to find a substitute medication at lower cost or fill out paperwork to get insurance to cover a needed medication.   If a prior authorization is required to get your medication covered by your insurance company, please allow us 1-2 business days to complete this process.  Drug prices often vary depending on where the prescription is filled and some pharmacies may offer cheaper prices.  The website www.goodrx.com contains coupons for medications through different pharmacies. The prices here do not account for what the cost may be with help from insurance (it may be cheaper with your insurance), but the website can give you the price if you did not use any insurance.  - You can print the associated coupon and take it with your prescription to the pharmacy.  - You may also stop by our office during regular business hours and pick up a GoodRx coupon card.  - If you need your prescription sent electronically to a different pharmacy, notify our office through [facility] MyChart or by phone at [phone] option  4.     Si Usted Necesita Algo Despus de Su Visita  Tambin puede enviarnos un mensaje a travs de MyChart. Por lo general respondemos a los mensajes de MyChart en el transcurso de 1 a 2 das hbiles.  Para renovar recetas, por favor pida a su farmacia que se ponga en contacto con nuestra oficina. Nuestro nmero de fax es el [phone].  Si tiene un asunto urgente cuando la clnica est cerrada y que no puede esperar hasta el siguiente da hbil, puede llamar/localizar a su doctor(a) al nmero que aparece a continuacin.   Por favor, tenga en cuenta que aunque hacemos todo lo posible para estar disponibles para asuntos urgentes fuera del horario de oficina, no estamos disponibles las 24 horas del da, los 7 das de la semana.   Si tiene un problema urgente y no puede comunicarse con nosotros, puede optar por buscar atencin mdica  en   el consultorio de su doctor(a), en una clnica privada, en un centro de atencin urgente o en una sala de emergencias.  Si tiene una emergencia mdica, por favor llame inmediatamente al 911 o vaya a la sala de emergencias.  Nmeros de bper  - Dr. Kowalski: [phone]  - Dra. Moye: [phone]  - Dra. Stewart: [phone]  En caso de inclemencias del tiempo, por favor llame a nuestra lnea principal al [phone] para una actualizacin sobre el estado de cualquier retraso o cierre.  Consejos para la medicacin en dermatologa: Por favor, guarde las cajas en las que vienen los medicamentos de uso tpico para ayudarle a seguir las instrucciones sobre dnde y cmo usarlos. Las farmacias generalmente imprimen las instrucciones del medicamento slo en las cajas y no directamente en los tubos del medicamento.   Si su medicamento es muy caro, por favor, pngase en contacto con nuestra oficina llamando al [phone] y presione la opcin 4 o envenos un mensaje a travs de MyChart.   No podemos decirle cul ser su copago por los medicamentos por  adelantado ya que esto es diferente dependiendo de la cobertura de su seguro. Sin embargo, es posible que podamos encontrar un medicamento sustituto a menor costo o llenar un formulario para que el seguro cubra el medicamento que se considera necesario.   Si se requiere una autorizacin previa para que su compaa de seguros cubra su medicamento, por favor permtanos de 1 a 2 das hbiles para completar este proceso.  Los precios de los medicamentos varan con frecuencia dependiendo del lugar de dnde se surte la receta y alguna farmacias pueden ofrecer precios ms baratos.  El sitio web www.goodrx.com tiene cupones para medicamentos de diferentes farmacias. Los precios aqu no tienen en cuenta lo que podra costar con la ayuda del seguro (puede ser ms barato con su seguro), pero el sitio web puede darle el precio si no utiliz ningn seguro.  - Puede imprimir el cupn correspondiente y llevarlo con su receta a la farmacia.  - Tambin puede pasar por nuestra oficina durante el horario de atencin regular y recoger una tarjeta de cupones de GoodRx.  - Si necesita que su receta se enve electrnicamente a una farmacia diferente, informe a nuestra oficina a travs de MyChart de [place] o por telfono llamando al [phone] y presione la opcin 4.  

## 2022-11-12 ENCOUNTER — Encounter: Payer: Self-pay | Admitting: Gastroenterology

## 2022-11-18 ENCOUNTER — Ambulatory Visit (AMBULATORY_SURGERY_CENTER): Payer: Medicare HMO | Admitting: Gastroenterology

## 2022-11-18 ENCOUNTER — Encounter: Payer: Self-pay | Admitting: Dermatology

## 2022-11-18 ENCOUNTER — Encounter: Payer: Self-pay | Admitting: Gastroenterology

## 2022-11-18 ENCOUNTER — Telehealth: Payer: Self-pay

## 2022-11-18 VITALS — BP 104/68 | HR 53 | Temp 97.8°F | Resp 11 | Ht 61.0 in | Wt 133.2 lb

## 2022-11-18 DIAGNOSIS — E039 Hypothyroidism, unspecified: Secondary | ICD-10-CM | POA: Diagnosis not present

## 2022-11-18 DIAGNOSIS — D131 Benign neoplasm of stomach: Secondary | ICD-10-CM

## 2022-11-18 DIAGNOSIS — R7303 Prediabetes: Secondary | ICD-10-CM | POA: Diagnosis not present

## 2022-11-18 DIAGNOSIS — K317 Polyp of stomach and duodenum: Secondary | ICD-10-CM | POA: Diagnosis not present

## 2022-11-18 DIAGNOSIS — F419 Anxiety disorder, unspecified: Secondary | ICD-10-CM | POA: Diagnosis not present

## 2022-11-18 MED ORDER — SODIUM CHLORIDE 0.9 % IV SOLN: 500.0000 mL | Freq: Once | INTRAVENOUS | Status: DC

## 2022-11-18 NOTE — Op Note (Signed)
[place] Patient Name: Jennifer [name] Procedure Date: 11/18/2022 10:10 AM MRN: [id number] Endoscopist: Thornton Park MD, MD, [id number] Age: 66 Referring MD:  Date of Birth: 1956-04-18 Gender: Female Account #: [id number] Procedure:                Upper GI endoscopy Indications:              Follow-up of gastric polyps Medicines:                Monitored Anesthesia Care Procedure:                Pre-Anesthesia Assessment:                           - Prior to the procedure, a History and Physical                            was performed, and patient medications and                            allergies were reviewed. The patient's tolerance of                            previous anesthesia was also reviewed. The risks                            and benefits of the procedure and the sedation                            options and risks were discussed with the patient.                            All questions were answered, and informed consent                            was obtained. Prior Anticoagulants: The patient has                            taken no anticoagulant or antiplatelet agents. ASA                            Grade Assessment: II - A patient with mild systemic                            disease. After reviewing the risks and benefits,                            the patient was deemed in satisfactory condition to                            undergo the procedure.                           After obtaining informed consent, the endoscope was  passed under direct vision. Throughout the                            procedure, the patient's blood pressure, pulse, and                            oxygen saturations were monitored continuously. The                            GIF [id number] #[id number] was introduced through the                            mouth, and advanced to the third part of duodenum.                            The upper GI endoscopy  was accomplished without                            difficulty. The patient tolerated the procedure                            well. Scope In: Scope Out: Findings:                 The esophagus was normal.                           A medium-sized hiatal hernia was present.                           Multiple small, medium, and large multi-lobulated                            and sessile polyps with no bleeding and no stigmata                            of recent bleeding were found in the gastric fundus                            and in the gastric body. The endoscopic appearance                            is that of fundic gland polyps. Biopsies were taken                            with a cold forceps for histology. Estimated blood                            loss was minimal.                           The examined duodenum was normal. Complications:            No immediate complications. Estimated Blood Loss:     Estimated blood loss was minimal. Impression:               -  Normal esophagus.                           - Medium-sized hiatal hernia.                           - Multiple gastric polyps. Biopsied.                           - Normal examined duodenum. Recommendation:           - Patient has a contact number available for                            emergencies. The signs and symptoms of potential                            delayed complications were discussed with the                            patient. Return to normal activities tomorrow.                            Written discharge instructions were provided to the                            patient.                           - Resume previous diet.                           - Continue present medications.                           - Await pathology results. If biopsies confirm                            fundic gland polyps, surveillance endoscopy is not                            indicated.                           -  Return to GI office after the pathology results                            are available at your convenience to discuss these                            findings. Thornton Park MD, MD 11/18/2022 10:36:46 AM This report has been signed electronically.

## 2022-11-18 NOTE — Progress Notes (Signed)
Referring Provider: Abner Greenspan, MD Primary Care Physician:  Tower, [name], MD   Indication for EGD:  Multiple gastric polyps   IMPRESSION:  Multiple gastric polyps on EGD 9/22 Appropriate candidate for monitored anesthesia care  PLAN: EGD in the [facility] today to screen for adenomas   HPI: Jennifer [name] is a 66 y.o. female presents for upper endoscopy to follow-up on multiple gastric polyps seen on EGD 07/30/21. Pathology revealed fundic gland polyps. Given the number of polyps, repeat exam with additional biopsies recommended in one year.     Past Medical History:  Diagnosis Date   Allergic rhinitis    Allergy    seasonal   Anemia    Anxiety    Arthritis    hands, knees   Asthma    "as a child"   Blood transfusion without reported diagnosis    "as a child"   Breast cancer ([facility]) 2019   Right Breast Cancer   Cancer ([facility]) 09/2018   Breast CA new DX, right breast   Cervical dysplasia    Common bile duct dilation    Diabetes mellitus without complication ([facility])    Endometriosis    Esophageal reflux    Family history of adverse reaction to anesthesia    younger sister anxiety for a dew days after   Family history of breast cancer    Family history of breast cancer    Family history of prostate cancer    Hepatic cyst 08/2020   multiple   Hepatic hemangioma 08/2020   intrahepatic hemangioma   Hiatal hernia    Hypertension    pt.denies 10/19/22   Hypothyroid    Migraine with aura, without mention of intractable migraine without mention of status migrainosus    Osteopenia 08/2019   T score -2.2 FRAX 11% / 1.7%   Personal history of radiation therapy 2019   Right Breast Cancer   Pre-diabetes     Past Surgical History:  Procedure Laterality Date   BIOPSY  09/12/2020   Procedure: BIOPSY;  Surgeon: [name], MD;  Location: [facility];  Service: Endoscopy;;   BREAST EXCISIONAL BIOPSY Bilateral over 10 years ago   benign x 2    BREAST LUMPECTOMY Right  10/10/2018   BREAST LUMPECTOMY WITH RADIOACTIVE SEED AND SENTINEL LYMPH NODE BIOPSY Right 10/10/2018   Procedure: RIGHT BREAST LUMPECTOMY WITH RADIOACTIVE SEED AND RIGHT SENTINEL LYMPH NODE BIOPSY;  Surgeon: [name], MD;  Location: [facility];  Service: General;  Laterality: Right;   BREAST SURGERY     Benign breast lump excised   CERVICAL CONE BIOPSY  1985   severe dysplasia   COLONOSCOPY  2005 and dec 2019   normal   COLPOSCOPY     endoscopy  10/2018   ESOPHAGOGASTRODUODENOSCOPY (EGD) WITH PROPOFOL N/A 09/12/2020   Procedure: ESOPHAGOGASTRODUODENOSCOPY (EGD) WITH PROPOFOL;  Surgeon: [name], MD;  Location: [facility];  Service: Endoscopy;  Laterality: N/A;   EUS N/A 09/12/2020   Procedure: UPPER ENDOSCOPIC ULTRASOUND (EUS) RADIAL;  Surgeon: [name], MD;  Location: [facility];  Service: Endoscopy;  Laterality: N/A;   LAPAROSCOPIC ASSISTED VAGINAL HYSTERECTOMY     LAPAROSCOPIC BILATERAL SALPINGO OOPHERECTOMY Bilateral 10/27/2019   Procedure: LAPAROSCOPIC BILATERAL SALPINGO OOPHORECTOMY WITH PERITONEAL WASHINGS;  Surgeon: Princess Bruins, MD;  Location: [facility];  Service: Gynecology;  Laterality: Bilateral;  request 1:00pm on Friday, Dec. 4th in [place] time held requests one hour OR time  moles removed from upper back, face lft, 1 between breasts, upper leg left inside  10/18/2019   wearing small round bandaids on for 2 weeks   ROTATOR CUFF REPAIR Bilateral 08/2008   UPPER GASTROINTESTINAL ENDOSCOPY      Current Outpatient Medications  Medication Sig Dispense Refill   ALPRAZolam (XANAX) 0.5 MG tablet TAKE 1/2 TO 1 TABLET BY MOUTH ONCE DAILY AS NEEDED FOR SLEEP/ANXIETY 15 tablet 2   atenolol (TENORMIN) 25 MG tablet Take 12.5 mg by mouth 3 (three) times daily. For migraine prevention     B Complex Vitamins (B COMPLEX PO) Take by mouth.     baclofen (LIORESAL) 10 MG tablet Take by mouth as needed.     CALCIUM  CARBONATE-VITAMIN D PO Take 1 tablet by mouth daily. 1000 mg     famotidine (PEPCID) 40 MG tablet Take 40 mg by mouth 2 (two) times daily.      hydrocortisone 2.5 % cream Apply to affected areas BID PRN as directed. 30 g 3   hydrOXYzine (ATARAX) 25 MG tablet Take 37.5 mg by mouth at bedtime as needed.     ketorolac (TORADOL) 10 MG tablet Take 1 tablet (10 mg total) by mouth every 6 (six) hours as needed for moderate pain. 12 tablet 0   letrozole (FEMARA) 2.5 MG tablet TAKE 1 TABLET BY MOUTH EVERY DAY 90 tablet 3   levocetirizine (XYZAL) 5 MG tablet Take 5 mg by mouth at bedtime.   3   levothyroxine (SYNTHROID) 75 MCG tablet TAKE ONE TABLET BY MOUTH ONE TIME DAILY BEFORE BREAKFAST 90 tablet 1   Magnesium Oxide 400 (240 Mg) MG TABS Take 400 mg by mouth daily.      metFORMIN (GLUCOPHAGE-XR) 500 MG 24 hr tablet TAKE 1 TABLET BY MOUTH EVERY DAY WITH BREAKFAST 90 tablet 1   Multiple Vitamin (MULTIVITAMIN WITH MINERALS) TABS tablet Take 1 tablet by mouth daily.     nabumetone (RELAFEN) 500 MG tablet Take 500 mg by mouth 2 (two) times daily.     neomycin-polymyxin b-dexamethasone (MAXITROL) 3.5-10000-0.1 SUSP 1 drop 3 (three) times daily.     Omega-3 Fatty Acids (FISH OIL) 1000 MG CAPS Take 1,000 mg by mouth daily.      OnabotulinumtoxinA (BOTOX IJ) Inject 1 Dose as directed every 6 (six) weeks.      ondansetron (ZOFRAN ODT) 4 MG disintegrating tablet Take 1 tablet (4 mg total) by mouth every 8 (eight) hours as needed. 20 tablet 0   pantoprazole (PROTONIX) 40 MG tablet TAKE ONE TABLET BY MOUTH ONE TIME DAILY 90 tablet 0   predniSONE (DELTASONE) 20 MG tablet Take 3 tablets (60 mg total) by mouth daily with breakfast. (Patient not taking: Reported on 10/19/2022) 9 tablet 0   PRESCRIPTION MEDICATION Inject into the skin every 6 (six) weeks. Steroid Trigger (Patient not taking: Reported on 10/19/2022)     Probiotic Product (PROBIOTIC-10 PO) Take 1 capsule by mouth daily.      promethazine (PHENERGAN) 25 MG  tablet Take 25 mg by mouth daily as needed.     rizatriptan (MAXALT) 10 MG tablet Take 1 tablet (10 mg total) by mouth once as needed for up to 10 days for migraine. May repeat in 2 hours if needed 10 tablet 0   triamcinolone (KENALOG) 0.1 % Apply to affected areas BID PRN for up to two weeks at a time as directed. 45 g 3   TURMERIC PO Take 1 tablet by mouth daily.     Current  Facility-Administered Medications  Medication Dose Route Frequency Provider Last Rate Last Admin   0.9 %  sodium chloride infusion  500 mL Intravenous Once Thornton Park, MD        Allergies as of 11/18/2022 - Review Complete 10/19/2022  Allergen Reaction Noted   Ciprofloxacin Other (See Comments) 09/21/2018   Codeine phosphate Other (See Comments) 02/07/2007   Decongestant [pseudoephedrine] Other (See Comments) 09/21/2018   Flonase [fluticasone propionate] Other (See Comments) 11/13/2014   Hydrocodone Other (See Comments) 11/27/2008   Macrobid [nitrofurantoin]  01/22/2021   Nasonex [mometasone]  10/29/2021   Reglan [metoclopramide] Other (See Comments) 09/23/2018   Topamax [topiramate] Other (See Comments) 09/21/2018   Benadryl [diphenhydramine hcl] Palpitations 10/18/2017   Sulfa antibiotics Anxiety 09/05/2014    Family History  Problem Relation Age of Onset   Breast cancer Mother 40   Diabetes Mother    Irritable bowel syndrome Mother    Hypertension Father    Heart disease Father    Hypertension Sister    Anuerysm Sister        head   Irritable bowel syndrome Sister    Hypertension Sister    Anxiety disorder Sister    Other Sister        prediabetes   Celiac disease Brother    Osteoporosis Maternal Grandmother    Diabetes Maternal Grandmother    COPD Paternal Grandmother    Heart disease Paternal Grandfather    Breast cancer Cousin 41       pat first cousin   Breast cancer Cousin        mat second cousin with breast cancer in her 35s   Thyroid cancer Cousin 56       pat first cousin    Prostate cancer Other 63   Colon cancer Neg Hx    Stomach cancer Neg Hx    Esophageal cancer Neg Hx    Pancreatic cancer Neg Hx    Rectal cancer Neg Hx    Colon polyps Neg Hx    Crohn's disease Neg Hx    Ulcerative colitis Neg Hx      Physical Exam: General:   Alert,  well-nourished, pleasant and cooperative in NAD Head:  Normocephalic and atraumatic. Eyes:  Sclera clear, no icterus.   Conjunctiva pink. Mouth:  No deformity or lesions.   Neck:  Supple; no masses or thyromegaly. Lungs:  Clear throughout to auscultation.   No wheezes. Heart:  Regular rate and rhythm; no murmurs. Abdomen:  Soft, non-tender, nondistended, normal bowel sounds, no rebound or guarding.  Msk:  Symmetrical. No boney deformities LAD: No inguinal or umbilical LAD Extremities:  No clubbing or edema. Neurologic:  Alert and  oriented x4;  grossly nonfocal Skin:  No obvious rash or bruise. Psych:  Alert and cooperative. Normal mood and affect.     Studies/Results: No results found.    Saralyn Willison L. Tarri Glenn, MD, MPH 11/18/2022, 9:28 AM

## 2022-11-18 NOTE — Patient Instructions (Signed)
Resume previous diet and present medications. Awaiting pathology results. If Biopsies confirm fundic gland polyps, surveillance endoscopy is not indicated. Return to GI Office after the pathology results are available at your convenience to discuss these findings.  YOU HAD AN ENDOSCOPIC PROCEDURE TODAY AT [facility]:   Refer to the procedure report that was given to you for any specific questions about what was found during the examination.  If the procedure report does not answer your questions, please call your gastroenterologist to clarify.  If you requested that your care partner not be given the details of your procedure findings, then the procedure report has been included in a sealed envelope for you to review at your convenience later.  YOU SHOULD EXPECT: Some feelings of bloating in the abdomen. Passage of more gas than usual.  Walking can help get rid of the air that was put into your GI tract during the procedure and reduce the bloating. If you had a lower endoscopy (such as a colonoscopy or flexible sigmoidoscopy) you may notice spotting of blood in your stool or on the toilet paper. If you underwent a bowel prep for your procedure, you may not have a normal bowel movement for a few days.  Please Note:  You might notice some irritation and congestion in your nose or some drainage.  This is from the oxygen used during your procedure.  There is no need for concern and it should clear up in a day or so.  SYMPTOMS TO REPORT IMMEDIATELY:  Following upper endoscopy (EGD)  Vomiting of blood or coffee ground material  New chest pain or pain under the shoulder blades  Painful or persistently difficult swallowing  New shortness of breath  Fever of 100F or higher  Black, tarry-looking stools  For urgent or emergent issues, a gastroenterologist can be reached at any hour by calling [phone]. Do not use MyChart messaging for urgent concerns.    DIET:  We do recommend a  small meal at first, but then you may proceed to your regular diet.  Drink plenty of fluids but you should avoid alcoholic beverages for 24 hours.  ACTIVITY:  You should plan to take it easy for the rest of today and you should NOT DRIVE or use heavy machinery until tomorrow (because of the sedation medicines used during the test).    FOLLOW UP: Our staff will call the number listed on your records the next business day following your procedure.  We will call around 7:15- 8:00 am to check on you and address any questions or concerns that you may have regarding the information given to you following your procedure. If we do not reach you, we will leave a message.     If any biopsies were taken you will be contacted by phone or by letter within the next 1-3 weeks.  Please call us at [phone] if you have not heard about the biopsies in 3 weeks.    SIGNATURES/CONFIDENTIALITY: You and/or your care partner have signed paperwork which will be entered into your electronic medical record.  These signatures attest to the fact that that the information above on your After Visit Summary has been reviewed and is understood.  Full responsibility of the confidentiality of this discharge information lies with you and/or your care-partner.

## 2022-11-18 NOTE — Telephone Encounter (Signed)
OV scheduled for 12/28/22 at 8:30 am with Dr. Tarri Glenn. Pt notified via mychart.

## 2022-11-18 NOTE — Progress Notes (Signed)
Sedate, gd SR, tolerated procedure well, VSS, report to RN 

## 2022-11-18 NOTE — Telephone Encounter (Signed)
-----   Message from Thornton Park, MD sent at 11/18/2022 10:30 AM EST ----- Please schedule office follow-up with me - next available. Thanks.  KLB

## 2022-11-18 NOTE — Progress Notes (Signed)
Called to room to assist during endoscopic procedure.  Patient ID and intended procedure confirmed with present staff. Received instructions for my participation in the procedure from the performing physician.  

## 2022-11-19 ENCOUNTER — Other Ambulatory Visit: Payer: Self-pay | Admitting: Obstetrics & Gynecology

## 2022-11-19 ENCOUNTER — Ambulatory Visit
Admission: RE | Admit: 2022-11-19 | Discharge: 2022-11-19 | Disposition: A | Discharge status: Home / Self Care | Payer: Medicare HMO | Source: Ambulatory Visit | Attending: Obstetrics & Gynecology | Admitting: Obstetrics & Gynecology

## 2022-11-19 ENCOUNTER — Telehealth: Payer: Self-pay | Admitting: *Deleted

## 2022-11-19 DIAGNOSIS — R921 Mammographic calcification found on diagnostic imaging of breast: Secondary | ICD-10-CM

## 2022-11-19 DIAGNOSIS — Z9889 Other specified postprocedural states: Secondary | ICD-10-CM

## 2022-11-19 DIAGNOSIS — Z853 Personal history of malignant neoplasm of breast: Secondary | ICD-10-CM | POA: Diagnosis not present

## 2022-11-19 DIAGNOSIS — R922 Inconclusive mammogram: Secondary | ICD-10-CM | POA: Diagnosis not present

## 2022-11-19 NOTE — Telephone Encounter (Signed)
  Follow up Call-     11/18/2022    9:30 AM 08/20/2021    1:26 PM 07/30/2021    2:32 PM  Call back number  Post procedure Call Back phone  # [phone]  Permission to leave phone message Yes Yes Yes     Patient questions:  Message

## 2022-11-20 ENCOUNTER — Telehealth: Payer: Self-pay

## 2022-11-20 NOTE — Telephone Encounter (Signed)
The radiologist is the expert on this and I would recommend that she continue as they recommended.  They can do an ultrasound, but they don't always have to, and we defer to their judgment.    There isn't anyone at the hospital who does these procedures.    Thanks, Clifford

## 2022-11-20 NOTE — Telephone Encounter (Signed)
Jennifer [name] called because she recently had a mammogram at the [facility] and they saw a calcium deposit in her left breast.  [facility] a [facility] on Friday.  She is concerned that they are not doing an ultrasound and she also wanted to know if she should have her biopsy there or at the hospital?  She has gone through this before and is anxious and just wants to make sure the correct process is being followed. Gardiner Rhyme, RN

## 2022-11-20 NOTE — Telephone Encounter (Signed)
Called Mrs. Spano back about her concerns with the biopsy at the [place].  Advised her what Mendel Ryder stated and it eased her concerns.  She will be in touch with [place] if there are any other issues. Gardiner Rhyme, RN'

## 2022-11-27 ENCOUNTER — Ambulatory Visit
Admission: RE | Admit: 2022-11-27 | Discharge: 2022-11-27 | Disposition: A | Discharge status: Home / Self Care | Payer: Medicare HMO | Source: Ambulatory Visit | Attending: Obstetrics & Gynecology | Admitting: Obstetrics & Gynecology

## 2022-11-27 DIAGNOSIS — Z9889 Other specified postprocedural states: Secondary | ICD-10-CM

## 2022-11-27 DIAGNOSIS — R921 Mammographic calcification found on diagnostic imaging of breast: Secondary | ICD-10-CM

## 2022-11-27 DIAGNOSIS — N6489 Other specified disorders of breast: Secondary | ICD-10-CM | POA: Diagnosis not present

## 2022-11-27 HISTORY — PX: BREAST BIOPSY: SHX20

## 2022-12-01 DIAGNOSIS — R21 Rash and other nonspecific skin eruption: Secondary | ICD-10-CM | POA: Diagnosis not present

## 2022-12-01 DIAGNOSIS — L298 Other pruritus: Secondary | ICD-10-CM | POA: Diagnosis not present

## 2022-12-01 DIAGNOSIS — J301 Allergic rhinitis due to pollen: Secondary | ICD-10-CM | POA: Diagnosis not present

## 2022-12-08 ENCOUNTER — Other Ambulatory Visit: Payer: Self-pay | Admitting: Family Medicine

## 2022-12-17 DIAGNOSIS — M791 Myalgia, unspecified site: Secondary | ICD-10-CM | POA: Diagnosis not present

## 2022-12-17 DIAGNOSIS — G518 Other disorders of facial nerve: Secondary | ICD-10-CM | POA: Diagnosis not present

## 2022-12-17 DIAGNOSIS — G43019 Migraine without aura, intractable, without status migrainosus: Secondary | ICD-10-CM | POA: Diagnosis not present

## 2022-12-17 DIAGNOSIS — M542 Cervicalgia: Secondary | ICD-10-CM | POA: Diagnosis not present

## 2022-12-17 DIAGNOSIS — G43719 Chronic migraine without aura, intractable, without status migrainosus: Secondary | ICD-10-CM | POA: Diagnosis not present

## 2022-12-19 ENCOUNTER — Ambulatory Visit
Admission: EM | Admit: 2022-12-19 | Discharge: 2022-12-19 | Disposition: A | Discharge status: Home / Self Care | Payer: Medicare HMO | Attending: Emergency Medicine | Admitting: Emergency Medicine

## 2022-12-19 DIAGNOSIS — N3001 Acute cystitis with hematuria: Secondary | ICD-10-CM

## 2022-12-19 HISTORY — DX: Raynaud's syndrome without gangrene: I73.00

## 2022-12-19 LAB — POCT URINALYSIS DIP (MANUAL ENTRY)
Bilirubin, UA: NEGATIVE
Glucose, UA: NEGATIVE mg/dL
Ketones, POC UA: NEGATIVE mg/dL
Nitrite, UA: NEGATIVE
Protein Ur, POC: 100 mg/dL — AB
Spec Grav, UA: 1.015 (ref 1.010–1.025)
Urobilinogen, UA: 0.2 E.U./dL
pH, UA: 7.5 (ref 5.0–8.0)

## 2022-12-19 MED ORDER — CEPHALEXIN 500 MG PO CAPS: 500.0000 mg | ORAL_CAPSULE | Freq: Two times a day (BID) | ORAL | 0 refills | Status: AC

## 2022-12-19 NOTE — Discharge Instructions (Addendum)
Take the antibiotic as directed.  The urine culture is pending.  We will call you if it shows the need to change or discontinue your antibiotic.   ° °Follow up with your primary care provider if your symptoms are not improving.   ° ° °

## 2022-12-19 NOTE — ED Provider Notes (Signed)
Roderic Palau    CSN: [id number] Arrival date & time: 12/19/22  5631      History   Chief Complaint Chief Complaint  Patient presents with   Urinary Frequency    A lot of blood in urine, lower back pain mild bodyaches. I have a UTI. - Entered by patient    HPI Jennifer [name] is a 67 y.o. female.  Patient presents with 1 day history of dysuria, urinary frequency, urinary urgency, hematuria, body aches, low back pain.  No fever, chills, abdominal pain, vaginal discharge, pelvic pain, or other symptoms.  No treatment at home.  Her medical history includes breast cancer, diabetes, hypothryroidism, anemia, migraine headaches, GERD.  The history is provided by the patient and medical records.    Past Medical History:  Diagnosis Date   Allergic rhinitis    Allergy    seasonal   Anemia    Anxiety    Arthritis    hands, knees   Asthma    "as a child"   Blood transfusion without reported diagnosis    "as a child"   Breast cancer ([place]) 2019   Right Breast Cancer   Cancer ([place]) 09/2018   Breast CA new DX, right breast   Cervical dysplasia    Common bile duct dilation    Diabetes mellitus without complication ([place])    Endometriosis    Esophageal reflux    Family history of adverse reaction to anesthesia    younger sister anxiety for a dew days after   Family history of breast cancer    Family history of breast cancer    Family history of prostate cancer    Hepatic cyst 08/2020   multiple   Hepatic hemangioma 08/2020   intrahepatic hemangioma   Hiatal hernia    Hypertension    pt.denies 10/19/22   Hypothyroid    Migraine with aura, without mention of intractable migraine without mention of status migrainosus    Osteopenia 08/2019   T score -2.2 FRAX 11% / 1.7%   Personal history of radiation therapy 2019   Right Breast Cancer   Pre-diabetes    Raynaud's disease     Patient Active Problem List   Diagnosis Date Noted   Pain in right shoulder 08/12/2022    Hordeolum externum (stye) 08/04/2022   Raynaud's disease 03/17/2022   Left facial swelling 01/25/2022   Left ear pain 01/23/2022   Acute cystitis with hematuria 11/26/2021   Right leg pain 10/15/2021   Microcytic anemia 07/07/2021   De Quervain's tenosynovitis, right 04/18/2021   Iron deficiency anemia 09/08/2020   Common bile duct dilatation 09/06/2020   Opacity of lung on imaging study 09/06/2020   Dizziness 07/10/2020   History of breast cancer 03/01/2020   Genetic testing 11/14/2019   Bilateral hand pain 11/03/2019   Family history of prostate cancer    Adnexal cyst 09/26/2019   Family history of breast cancer    Malignant neoplasm of upper-inner quadrant of right breast in female, estrogen receptor positive ([place]) 09/20/2018   B12 deficiency 04/13/2018   Joint pain 04/11/2018   Colon cancer screening 09/16/2017   Prediabetes 07/28/2016   Allergic rhinitis 11/13/2014   Screening for lipoid disorders 09/26/2014   Preventative health care 09/26/2014   Dysuria 08/07/2014   Routine general medical examination at a health care facility 10/28/2012   GERD 09/16/2010   Hypothyroidism 05/07/2008   Osteopenia 05/07/2008   Migraine with aura 11/11/2007    Past Surgical History:  Procedure Laterality Date   BIOPSY  09/12/2020   Procedure: BIOPSY;  Surgeon: [name], MD;  Location: [facility];  Service: Endoscopy;;   BREAST BIOPSY Left 11/27/2022   MM LT BREAST BX W LOC DEV 1ST LESION IMAGE BX [place] STEREO GUIDE 11/27/2022 [facility]   BREAST EXCISIONAL BIOPSY Bilateral over 10 years ago   benign x 2    BREAST LUMPECTOMY Right 10/10/2018   BREAST LUMPECTOMY WITH RADIOACTIVE SEED AND SENTINEL LYMPH NODE BIOPSY Right 10/10/2018   Procedure: RIGHT BREAST LUMPECTOMY WITH RADIOACTIVE SEED AND RIGHT SENTINEL LYMPH NODE BIOPSY;  Surgeon: [name], MD;  Location: [place];  Service: General;  Laterality: Right;   BREAST SURGERY     Benign breast lump  excised   CERVICAL CONE BIOPSY  1985   severe dysplasia   COLONOSCOPY  2005 and dec 2019   normal   COLPOSCOPY     endoscopy  10/2018   ESOPHAGOGASTRODUODENOSCOPY (EGD) WITH PROPOFOL N/A 09/12/2020   Procedure: ESOPHAGOGASTRODUODENOSCOPY (EGD) WITH PROPOFOL;  Surgeon: [name], MD;  Location: [facility];  Service: Endoscopy;  Laterality: N/A;   EUS N/A 09/12/2020   Procedure: UPPER ENDOSCOPIC ULTRASOUND (EUS) RADIAL;  Surgeon: [name], MD;  Location: [facility];  Service: Endoscopy;  Laterality: N/A;   LAPAROSCOPIC ASSISTED VAGINAL HYSTERECTOMY     LAPAROSCOPIC BILATERAL SALPINGO OOPHERECTOMY Bilateral 10/27/2019   Procedure: LAPAROSCOPIC BILATERAL SALPINGO OOPHORECTOMY WITH PERITONEAL WASHINGS;  Surgeon: Princess Bruins, MD;  Location: [place];  Service: Gynecology;  Laterality: Bilateral;  request 1:00pm on Friday, Dec. 4th in [place] time held requests one hour OR time   moles removed from upper back, face lft, 1 between breasts, upper leg left inside  10/18/2019   wearing small round bandaids on for 2 weeks   ROTATOR CUFF REPAIR Bilateral 08/2008   UPPER GASTROINTESTINAL ENDOSCOPY      OB History     Gravida  0   Para  0   Term  0   Preterm  0   AB  0   Living  0      SAB  0   IAB  0   Ectopic  0   Multiple  0   Live Births  0            Home Medications    Prior to Admission medications   Medication Sig Start Date End Date Taking? Authorizing Provider  cephALEXin (KEFLEX) 500 MG capsule Take 1 capsule (500 mg total) by mouth 2 (two) times daily for 5 days. 12/19/22 12/24/22 Yes Sharion Balloon, NP  ALPRAZolam Duanne Moron) 0.5 MG tablet TAKE 1/2 TO 1 TABLET BY MOUTH ONCE DAILY AS NEEDED FOR SLEEP/ANXIETY 10/22/22   Tower, [name], MD  atenolol (TENORMIN) 25 MG tablet Take 12.5 mg by mouth 3 (three) times daily. For migraine prevention    [provider]  B Complex Vitamins (B COMPLEX PO) Take by  mouth.    [provider]  baclofen (LIORESAL) 10 MG tablet Take by mouth as needed. 11/18/21   [provider]  CALCIUM CARBONATE-VITAMIN D PO Take 1 tablet by mouth daily. 1000 mg    [provider]  famotidine (PEPCID) 40 MG tablet Take 40 mg by mouth 2 (two) times daily.     [provider]  hydrocortisone 2.5 % cream Apply to affected areas BID PRN as directed. 10/23/20   Moye, [place], MD  hydrOXYzine (ATARAX) 25  MG tablet Take 37.5 mg by mouth at bedtime as needed.    [provider]  ketorolac (TORADOL) 10 MG tablet Take 1 tablet (10 mg total) by mouth every 6 (six) hours as needed for moderate pain. 09/07/20   Carrie Mew, MD  letrozole [facility]) 2.5 MG tablet TAKE 1 TABLET BY MOUTH EVERY DAY 09/02/22   Nicholas Lose, MD  levocetirizine (XYZAL) 5 MG tablet Take 5 mg by mouth at bedtime.  10/28/17   [provider]  levothyroxine (SYNTHROID) 75 MCG tablet TAKE ONE TABLET BY MOUTH ONE TIME DAILY BEFORE BREAKFAST 09/04/22   Tower, [name], MD  Magnesium Oxide 400 (240 Mg) MG TABS Take 400 mg by mouth daily.     [provider]  metFORMIN (GLUCOPHAGE-XR) 500 MG 24 hr tablet TAKE 1 TABLET BY MOUTH EVERY DAY WITH BREAKFAST 12/08/22   Tower, [name], MD  Multiple Vitamin (MULTIVITAMIN WITH MINERALS) TABS tablet Take 1 tablet by mouth daily.    [provider]  nabumetone (RELAFEN) 500 MG tablet Take 500 mg by mouth 2 (two) times daily. 09/03/22   [provider]  neomycin-polymyxin b-dexamethasone (MAXITROL) 3.5-10000-0.1 SUSP 1 drop 3 (three) times daily. 08/27/22   [provider]  Omega-3 Fatty Acids (FISH OIL) 1000 MG CAPS Take 1,000 mg by mouth daily.     [provider]  OnabotulinumtoxinA (BOTOX IJ) Inject 1 Dose as directed every 6 (six) weeks.     [provider]  ondansetron (ZOFRAN ODT) 4 MG disintegrating tablet Take 1 tablet (4 mg total) by mouth every 8 (eight) hours as  needed. 10/08/20   Rudene Re, MD  pantoprazole (PROTONIX) 40 MG tablet TAKE ONE TABLET BY MOUTH ONE TIME DAILY 09/23/22   Tower, [name], MD  predniSONE (DELTASONE) 20 MG tablet Take 3 tablets (60 mg total) by mouth daily with breakfast. Patient not taking: Reported on 10/19/2022 10/12/22   Nena Polio, MD  PRESCRIPTION MEDICATION Inject into the skin every 6 (six) weeks. Steroid Trigger Patient not taking: Reported on 10/19/2022    [provider]  Probiotic Product (PROBIOTIC-10 PO) Take 1 capsule by mouth daily.     [provider]  promethazine (PHENERGAN) 25 MG tablet Take 25 mg by mouth daily as needed. 10/12/22   [provider]  rizatriptan (MAXALT) 10 MG tablet Take 1 tablet (10 mg total) by mouth once as needed for up to 10 days for migraine. May repeat in 2 hours if needed 10/15/21 01/28/22  Tower, [name], MD  triamcinolone (KENALOG) 0.1 % Apply to affected areas BID PRN for up to two weeks at a time as directed. 10/23/20   Moye, [place], MD  TURMERIC PO Take 1 tablet by mouth daily.    [provider]    Family History Family History  Problem Relation Age of Onset   Breast cancer Mother 75   Diabetes Mother    Irritable bowel syndrome Mother    Hypertension Father    Heart disease Father    Hypertension Sister    Anuerysm Sister        head   Irritable bowel syndrome Sister    Hypertension Sister    Anxiety disorder Sister    Other Sister        prediabetes   Celiac disease Brother    Osteoporosis Maternal Grandmother    Diabetes Maternal Grandmother    COPD Paternal Grandmother    Heart disease Paternal Grandfather    Breast  cancer Cousin 27       pat first cousin   Breast cancer Cousin        mat second cousin with breast cancer in her 35s   Thyroid cancer Cousin 72       pat first cousin   Prostate cancer Other 37   Colon cancer Neg Hx    Stomach cancer Neg Hx    Esophageal cancer Neg Hx    Pancreatic cancer Neg  Hx    Rectal cancer Neg Hx    Colon polyps Neg Hx    Crohn's disease Neg Hx    Ulcerative colitis Neg Hx     Social History Social History   Tobacco Use   Smoking status: Never    Passive exposure: Never   Smokeless tobacco: Never  Vaping Use   Vaping Use: Never used  Substance Use Topics   Alcohol use: No    Alcohol/week: 0.0 standard drinks of alcohol   Drug use: No     Allergies   Ciprofloxacin, Codeine phosphate, Decongestant [pseudoephedrine], Flonase [fluticasone propionate], Hydrocodone, Macrobid [nitrofurantoin], Nasonex [mometasone], Reglan [metoclopramide], Topamax [topiramate], Benadryl [diphenhydramine hcl], and Sulfa antibiotics   Review of Systems Review of Systems  Constitutional:  Negative for chills and fever.  Gastrointestinal:  Negative for abdominal pain, nausea and vomiting.  Genitourinary:  Positive for dysuria, frequency and hematuria. Negative for flank pain, pelvic pain and vaginal discharge.  Musculoskeletal:  Positive for back pain.  All other systems reviewed and are negative.    Physical Exam Triage Vital Signs ED Triage Vitals  Enc Vitals Group     BP      Pulse      Resp      Temp      Temp src      SpO2      Weight      Height      Head Circumference      Peak Flow      Pain Score      Pain Loc      Pain Edu?      Excl. in [place]?    No data found.  Updated Vital Signs BP 127/72   Pulse 96   Temp 99 F (37.2 C)   Resp 18   Ht '5\' 1"'$  (1.549 m)   Wt 130 lb (59 kg)   SpO2 98%   BMI 24.56 kg/m   Visual Acuity Right Eye Distance:   Left Eye Distance:   Bilateral Distance:    Right Eye Near:   Left Eye Near:    Bilateral Near:     Physical Exam Vitals and nursing note reviewed.  Constitutional:      General: She is not in acute distress.    Appearance: Normal appearance. She is well-developed. She is not ill-appearing.  HENT:     Mouth/Throat:     Mouth: Mucous membranes are moist.  Cardiovascular:     Rate  and Rhythm: Normal rate and regular rhythm.     Heart sounds: Normal heart sounds.  Pulmonary:     Effort: Pulmonary effort is normal. No respiratory distress.     Breath sounds: Normal breath sounds.  Abdominal:     General: Bowel sounds are normal.     Palpations: Abdomen is soft.     Tenderness: There is no abdominal tenderness. There is no right CVA tenderness, left CVA tenderness, guarding or rebound.  Musculoskeletal:     Cervical back: Neck supple.  Skin:    General: Skin is warm and dry.  Neurological:     Mental Status: She is alert.  Psychiatric:        Mood and Affect: Mood normal.        Behavior: Behavior normal.      UC Treatments / Results  Labs (all labs ordered are listed, but only abnormal results are displayed) Labs Reviewed  POCT URINALYSIS DIP (MANUAL ENTRY) - Abnormal; Notable for the following components:      Result Value   Color, UA red (*)    Clarity, UA cloudy (*)    Blood, UA large (*)    Protein Ur, POC =100 (*)    Leukocytes, UA Moderate (2+) (*)    All other components within normal limits  URINE CULTURE    EKG   Radiology No results found.  Procedures Procedures (including critical care time)  Medications Ordered in UC Medications - No data to display  Initial Impression / Assessment and Plan / UC Course  I have reviewed the triage vital signs and the nursing notes.  Pertinent labs & imaging results that were available during my care of the patient were reviewed by me and considered in my medical decision making (see chart for details).    Acute cystitis with hematuria.  Treating with Keflex. Urine culture pending. Discussed with patient that we will call her if the urine culture shows the need to change or discontinue the antibiotic. Instructed her to follow-up with her PCP if her symptoms are not improving and for urine recheck for resolution of hematuria. Patient agrees to plan of care.     Final Clinical Impressions(s) / UC  Diagnoses   Final diagnoses:  Acute cystitis with hematuria     Discharge Instructions      Take the antibiotic as directed.  The urine culture is pending.  We will call you if it shows the need to change or discontinue your antibiotic.    Follow up with your primary care provider if your symptoms are not improving.        ED Prescriptions     Medication Sig Dispense Auth. Provider   cephALEXin (KEFLEX) 500 MG capsule Take 1 capsule (500 mg total) by mouth 2 (two) times daily for 5 days. 10 capsule Sharion Balloon, NP      PDMP not reviewed this encounter.   Sharion Balloon, NP 12/19/22 1056

## 2022-12-19 NOTE — ED Triage Notes (Signed)
Patient to Urgent Care with complaints of urinary frequency, dysuria and urgency. Reports seeing blood in her urine this morning, having lower back pain, and mild body aches.   Denies any known fevers.  Symptoms started yesterday.

## 2022-12-21 ENCOUNTER — Telehealth: Payer: Self-pay

## 2022-12-21 LAB — URINE CULTURE: Culture: <10000 — AB

## 2022-12-21 NOTE — Telephone Encounter (Signed)
[facility] - Client TELEPHONE ADVICE RECORD [facility] Patient Name: Jennifer [name] Gender: Female DOB: 1956/09/01 Age: 67 Y 2 M 18 [name] Return Phone Number: [phone] (Primary) Address: [place]/ State/ Zip: [place] [address] Client [facility] - Client Client Site [place] Provider Tower, Roque Lias - MD Contact Type Call Who Is Calling Patient / Member / Family / Caregiver Call Type Triage / Clinical Relationship To Patient Self Return Phone Number [phone] (Primary) Chief Complaint Urination Frequency Reason for Call Symptomatic / Request for [place] states she has a UTI and is having vaginal burning, and frequent urination. Caller states she is having some vaginal bleeding as well. Translation No Nurse Assessment Nurse: Micki Riley, RN, Domenick Gong Date/Time (Eastern Time): 12/19/2022 8:06:37 AM Confirm and document reason for call. If symptomatic, describe symptoms. ---Caller states she has been having urinary frequency, lower back pain, blood in her urine, & vaginal burning; first noted yesterday. Has a little bit of a sore throat & tired. Denies fever or any other symptoms. Does the patient have any new or worsening symptoms? ---Yes Will a triage be completed? ---Yes Related visit to physician within the last 2 weeks? ---N/A Does the PT have any chronic conditions? (i.e. diabetes, asthma, this includes High risk factors for pregnancy, etc.) ---Unknown Is this a behavioral health or substance abuse call? ---No Guidelines Guideline Title Affirmed Question Affirmed Notes Nurse Date/Time (Eastern Time) Urine - Blood In [1] Pain or burning with passing urine AND [2] side (flank) or back pain present Cazares, RN, [employer] Janie 12/19/2022 8:10:10 AM Disp. Time Eilene Ghazi Time) Disposition Final User 12/19/2022  8:13:33 AM See HCP within 4 Hours (or PCP triage) Yes Cazares, RN, Domenick Gong PLEASE NOTE: All timestamps contained within this report are represented as [place] Standard Time. CONFIDENTIALTY NOTICE: This fax transmission is intended only for the addressee. It contains information that is legally privileged, confidential or otherwise protected from use or disclosure. If you are not the intended recipient, you are strictly prohibited from reviewing, disclosing, copying using or disseminating any of this information or taking any action in reliance on or regarding this information. If you have received this fax in error, please notify us immediately by telephone so that we can arrange for its return to [place]. Phone: [phone], Toll-Free: [phone], Fax: [phone] Page: 2 of 2 Call Id: [id number] Final Disposition 12/19/2022 8:13:33 AM See HCP within 4 Hours (or PCP triage) Yes Cazares, RN, Domenick Gong Caller Disagree/Comply Comply Caller Understands Yes PreDisposition Did not know what to do Care Advice Given Per Guideline SEE HCP (OR PCP TRIAGE) WITHIN 4 HOURS: CARE ADVICE given per Urine, Blood In (Adult) guideline. CALL BACK IF: * Fever occurs * You become worse Referrals [facility]- [facility] - [facility]

## 2022-12-21 NOTE — Telephone Encounter (Signed)
Per chart review pt has already been seen at [facility]. Sending note to Dr Glori Bickers.

## 2022-12-21 NOTE — Telephone Encounter (Signed)
[place] Night - Client TELEPHONE ADVICE RECORD [facility] Patient Name: Jennifer [name] Gender: Female DOB: December 23, 1955 Age: 67 Y 2 M 18 [name] Return Phone Number: [phone] (Primary) Address: [place]/ State/ Zip: [place] [address] Client [facility] Night - Client Client Site [place] Provider [facility], Roque Lias - MD Contact Type Call Who Is Calling Patient / Member / Family / Caregiver Call Type Triage / Clinical Relationship To Patient Self Return Phone Number [phone] (Primary) Chief Complaint Urination Frequency Reason for Call Symptomatic / Request for [name] states she has a UTI and is having vaginal burning, and frequent urination. Caller states she is having some vaginal bleeding as well. Translation No Nurse Assessment Nurse: Micki Riley, RN, Domenick Gong Date/Time (Eastern Time): 12/19/2022 8:06:37 AM Confirm and document reason for call. If symptomatic, describe symptoms. ---Caller states she has been having urinary frequency, lower back pain, blood in her urine, & vaginal burning; first noted yesterday. Has a little bit of a sore throat & tired. Denies fever or any other symptoms. Does the patient have any new or worsening symptoms? ---Yes Will a triage be completed? ---Yes Related visit to physician within the last 2 weeks? ---N/A Does the PT have any chronic conditions? (i.e. diabetes, asthma, this includes High risk factors for pregnancy, etc.) ---Unknown Is this a behavioral health or substance abuse call? ---No Guidelines Guideline Title Affirmed Question Affirmed Notes Nurse Date/Time (Eastern Time) Urine - Blood In [1] Pain or burning with passing urine AND [2] side (flank) or back pain present Cazares, RN, [employer] Janie 12/19/2022 8:10:10 AM Disp. Time Eilene Ghazi Time) Disposition Final User 12/19/2022  8:13:33 AM See HCP within 4 Hours (or PCP triage) Yes Cazares, RN, Domenick Gong PLEASE NOTE: All timestamps contained within this report are represented as [place] Standard Time. CONFIDENTIALTY NOTICE: This fax transmission is intended only for the addressee. It contains information that is legally privileged, confidential or otherwise protected from use or disclosure. If you are not the intended recipient, you are strictly prohibited from reviewing, disclosing, copying using or disseminating any of this information or taking any action in reliance on or regarding this information. If you have received this fax in error, please notify us immediately by telephone so that we can arrange for its return to [place]. Phone: [phone], Toll-Free: [phone], Fax: [phone] Page: 2 of 2 Call Id: [id number] Final Disposition 12/19/2022 8:13:33 AM See HCP within 4 Hours (or PCP triage) Yes Cazares, RN, Domenick Gong Caller Disagree/Comply Comply Caller Understands Yes PreDisposition Did not know what to do Care Advice Given Per Guideline SEE HCP (OR PCP TRIAGE) WITHIN 4 HOURS: CARE ADVICE given per Urine, Blood In (Adult) guideline. CALL BACK IF: * Fever occurs * You become worse Referrals [facility] - U

## 2022-12-21 NOTE — Telephone Encounter (Signed)
Was tx with keflex Culture showed insignificant growth

## 2022-12-22 ENCOUNTER — Telehealth: Payer: Self-pay | Admitting: Gastroenterology

## 2022-12-22 ENCOUNTER — Encounter: Payer: Self-pay | Admitting: Family Medicine

## 2022-12-22 NOTE — Telephone Encounter (Signed)
Patient returned your call.

## 2022-12-22 NOTE — Telephone Encounter (Signed)
Pt stated that she requested a early AM  appointment and was told that she was going to be scheduled for 12/29/2022 and when she checked her my chart she realized that she was scheduled for 01/27/2023. Pt was rescheduled to see Dr. Tarri Glenn for 12/28/2022 at 1:30 PM. Pt made aware: Pt verbalized understanding with all questions answered.

## 2022-12-22 NOTE — Telephone Encounter (Signed)
Patient rescheduled appointment for 2/5 was told would be on 2/6 and was set for 3/6. She needs a sooner appointment. Please advise.

## 2022-12-22 NOTE — Telephone Encounter (Signed)
Left message for pt to call back  °

## 2022-12-22 NOTE — Telephone Encounter (Signed)
No appts today but pt is scheduled to see you tomorrow 12/23/22

## 2022-12-23 ENCOUNTER — Encounter: Payer: Self-pay | Admitting: Family Medicine

## 2022-12-23 ENCOUNTER — Ambulatory Visit (INDEPENDENT_AMBULATORY_CARE_PROVIDER_SITE_OTHER): Payer: Medicare HMO | Admitting: Family Medicine

## 2022-12-23 VITALS — BP 130/84 | HR 65 | Temp 97.7°F | Ht 61.0 in | Wt 133.4 lb

## 2022-12-23 DIAGNOSIS — N3001 Acute cystitis with hematuria: Secondary | ICD-10-CM | POA: Diagnosis not present

## 2022-12-23 DIAGNOSIS — J069 Acute upper respiratory infection, unspecified: Secondary | ICD-10-CM

## 2022-12-23 LAB — POC URINALSYSI DIPSTICK (AUTOMATED)
Bilirubin, UA: 4
Blood, UA: 10
Glucose, UA: NEGATIVE
Ketones, UA: 5
Leukocytes, UA: NEGATIVE
Nitrite, UA: NEGATIVE
Protein, UA: POSITIVE — AB
Spec Grav, UA: 1.015 (ref 1.010–1.025)
Urobilinogen, UA: 0.2 E.U./dL
pH, UA: 6 (ref 5.0–8.0)

## 2022-12-23 LAB — POCT UA - MICROSCOPIC ONLY

## 2022-12-23 MED ORDER — TRAMADOL HCL 50 MG PO TABS: 25.0000 mg | ORAL_TABLET | Freq: Three times a day (TID) | ORAL | 0 refills | Status: DC | PRN

## 2022-12-23 NOTE — Progress Notes (Signed)
Subjective:    Patient ID: Jennifer [name], female    DOB: [birth year], 67 y.o.   MRN: [id number]  HPI Pt presents with uri symptoms  Also for f/u of urinary symptoms   Wt Readings from Last 3 Encounters:  12/23/22 133 lb 6 oz (60.5 kg)  12/19/22 130 lb (59 kg)  11/18/22 133 lb 3.2 oz (60.4 kg)   25.20 kg/m  Vitals:   12/23/22 1400  BP: 130/84  Pulse: 65  Temp: 97.7 F (36.5 C)  SpO2: 100%    [facility] at [facility] on [facility]27 Positive ua for blood/leuk, with frequency dysuria and hematuria  Was tx with keflex Urine culture noted insignificant growth   Symptoms are gone now  No more visible blood   Ua today-  Results for orders placed or performed in visit on 12/23/22  POCT Urinalysis Dipstick (Automated)  Result Value Ref Range   Color, UA Yellow    Clarity, UA Clear    Glucose, UA Negative Negative   Bilirubin, UA 4 mg/dL    Ketones, UA 5 mg/dL    Spec Grav, UA 1.015 1.010 - 1.025   Blood, UA 10  Ery/uL    pH, UA 6.0 5.0 - 8.0   Protein, UA Positive (A) Negative   Urobilinogen, UA 0.2 0.2 or 1.0 E.U./dL   Nitrite, UA Negative    Leukocytes, UA Negative Negative  POCT UA - Microscopic Only  Result Value Ref Range   WBC, Ur, HPF, POC none 0 - 5   RBC, Urine, Miroscopic none 0 - 2   Bacteria, U Microscopic none None - Trace   Mucus, UA few    Epithelial cells, urine per micros few    Crystals, Ur, HPF, POC none    Casts, Ur, LPF, POC none    Yeast, UA        Now cold symptoms  Congestion - mainly in chest/less in nose  Cough is deep , makes her chest sore  Phlegm, light yellow  No wheeze No sob No fever  No chills  Covid tests are all negative   Otc  guaifen Had old tramadol px- took a pill for cough last night   Had a reaction from diclofenac  (tried for migraine)  Is really struggling with migraine   Very stressed with work  Hard to take care of herself    Patient Active Problem List   Diagnosis Date Noted   Pain in right shoulder 08/12/2022    Hordeolum externum (stye) 08/04/2022   Raynaud's disease 03/17/2022   Left facial swelling 01/25/2022   Left ear pain 01/23/2022   Acute cystitis with hematuria 11/26/2021   Right leg pain 10/15/2021   Microcytic anemia 07/07/2021   De Quervain's tenosynovitis, right 04/18/2021   Iron deficiency anemia 09/08/2020   Common bile duct dilatation 09/06/2020   Opacity of lung on imaging study 09/06/2020   Dizziness 07/10/2020   History of breast cancer 03/01/2020   Genetic testing 11/14/2019   Bilateral hand pain 11/03/2019   Family history of prostate cancer    Adnexal cyst 09/26/2019   Family history of breast cancer    Malignant neoplasm of upper-inner quadrant of right breast in female, estrogen receptor positive ([place]) 09/20/2018   B12 deficiency 04/13/2018   Joint pain 04/11/2018   Colon cancer screening 09/16/2017   Prediabetes 07/28/2016   Allergic rhinitis 11/13/2014   Screening for lipoid disorders 09/26/2014   Preventative health care 09/26/2014   Dysuria 08/07/2014  Routine general medical examination at a health care facility 10/28/2012   Viral URI with cough 03/03/2012   GERD 09/16/2010   Hypothyroidism 05/07/2008   Osteopenia 05/07/2008   Migraine with aura 11/11/2007   Past Medical History:  Diagnosis Date   Allergic rhinitis    Allergy    seasonal   Anemia    Anxiety    Arthritis    hands, knees   Asthma    "as a child"   Blood transfusion without reported diagnosis    "as a child"   Breast cancer ([place]) 2019   Right Breast Cancer   Cancer ([place]) 09/2018   Breast CA new DX, right breast   Cervical dysplasia    Common bile duct dilation    Diabetes mellitus without complication ([place])    Endometriosis    Esophageal reflux    Family history of adverse reaction to anesthesia    younger sister anxiety for a dew days after   Family history of breast cancer    Family history of breast cancer    Family history of prostate cancer    Hepatic cyst 08/2020    multiple   Hepatic hemangioma 08/2020   intrahepatic hemangioma   Hiatal hernia    Hypertension    pt.denies 10/19/22   Hypothyroid    Migraine with aura, without mention of intractable migraine without mention of status migrainosus    Osteopenia 08/2019   T score -2.2 FRAX 11% / 1.7%   Personal history of radiation therapy 2019   Right Breast Cancer   Pre-diabetes    Raynaud's disease    Past Surgical History:  Procedure Laterality Date   BIOPSY  09/12/2020   Procedure: BIOPSY;  Surgeon: [name], MD;  Location: [facility];  Service: Endoscopy;;   BREAST BIOPSY Left 11/27/2022   MM LT BREAST BX W LOC DEV 1ST LESION IMAGE BX SPEC STEREO GUIDE 11/27/2022 [facility]   BREAST EXCISIONAL BIOPSY Bilateral over 10 years ago   benign x 2    BREAST LUMPECTOMY Right 10/10/2018   BREAST LUMPECTOMY WITH RADIOACTIVE SEED AND SENTINEL LYMPH NODE BIOPSY Right 10/10/2018   Procedure: RIGHT BREAST LUMPECTOMY WITH RADIOACTIVE SEED AND RIGHT SENTINEL LYMPH NODE BIOPSY;  Surgeon: [name], MD;  Location: [facility];  Service: General;  Laterality: Right;   BREAST SURGERY     Benign breast lump excised   CERVICAL CONE BIOPSY  1985   severe dysplasia   COLONOSCOPY  2005 and dec 2019   normal   COLPOSCOPY     endoscopy  10/2018   ESOPHAGOGASTRODUODENOSCOPY (EGD) WITH PROPOFOL N/A 09/12/2020   Procedure: ESOPHAGOGASTRODUODENOSCOPY (EGD) WITH PROPOFOL;  Surgeon: [name], MD;  Location: [facility];  Service: Endoscopy;  Laterality: N/A;   EUS N/A 09/12/2020   Procedure: UPPER ENDOSCOPIC ULTRASOUND (EUS) RADIAL;  Surgeon: [name], MD;  Location: [facility];  Service: Endoscopy;  Laterality: N/A;   LAPAROSCOPIC ASSISTED VAGINAL HYSTERECTOMY     LAPAROSCOPIC BILATERAL SALPINGO OOPHERECTOMY Bilateral 10/27/2019   Procedure: LAPAROSCOPIC BILATERAL SALPINGO OOPHORECTOMY WITH PERITONEAL WASHINGS;  Surgeon: Princess Bruins, MD;  Location: [facility];  Service: Gynecology;  Laterality: Bilateral;  request 1:00pm on Friday, Dec. 4th in [place] time held requests one hour OR time   moles removed from upper back, face lft, 1 between breasts, upper leg left inside  10/18/2019   wearing small round bandaids on for 2 weeks   ROTATOR CUFF REPAIR  Bilateral 08/2008   UPPER GASTROINTESTINAL ENDOSCOPY     Social History   Tobacco Use   Smoking status: Never    Passive exposure: Never   Smokeless tobacco: Never  Vaping Use   Vaping Use: Never used  Substance Use Topics   Alcohol use: No    Alcohol/week: 0.0 standard drinks of alcohol   Drug use: No   Family History  Problem Relation Age of Onset   Breast cancer Mother 22   Diabetes Mother    Irritable bowel syndrome Mother    Hypertension Father    Heart disease Father    Hypertension Sister    Anuerysm Sister        head   Irritable bowel syndrome Sister    Hypertension Sister    Anxiety disorder Sister    Other Sister        prediabetes   Celiac disease Brother    Osteoporosis Maternal Grandmother    Diabetes Maternal Grandmother    COPD Paternal Grandmother    Heart disease Paternal Grandfather    Breast cancer Cousin 46       pat first cousin   Breast cancer Cousin        mat second cousin with breast cancer in her 8s   Thyroid cancer Cousin 47       pat first cousin   Prostate cancer Other 32   Colon cancer Neg Hx    Stomach cancer Neg Hx    Esophageal cancer Neg Hx    Pancreatic cancer Neg Hx    Rectal cancer Neg Hx    Colon polyps Neg Hx    Crohn's disease Neg Hx    Ulcerative colitis Neg Hx    Allergies  Allergen Reactions   Ciprofloxacin Other (See Comments)    Dizziness    Codeine Phosphate Other (See Comments)    Dizziness, heart palpitations, nervous   Decongestant [Pseudoephedrine] Other (See Comments)    dizziness   Diclofenac    Flonase [Fluticasone Propionate] Other (See Comments)    Worsens her migraine     Hydrocodone Other (See Comments)    Made nervous--10/09 surgery rotator cuff   Macrobid [Nitrofurantoin]     headaches   Nasonex [Mometasone]     Migraines   Reglan [Metoclopramide] Other (See Comments)    Dizziness   Topamax [Topiramate] Other (See Comments)    Dizziness    Benadryl [Diphenhydramine Hcl] Palpitations   Sulfa Antibiotics Anxiety   Current Outpatient Medications on File Prior to Visit  Medication Sig Dispense Refill   ALPRAZolam (XANAX) 0.5 MG tablet TAKE 1/2 TO 1 TABLET BY MOUTH ONCE DAILY AS NEEDED FOR SLEEP/ANXIETY 15 tablet 2   atenolol (TENORMIN) 25 MG tablet Take 12.5 mg by mouth 3 (three) times daily. For migraine prevention     B Complex Vitamins (B COMPLEX PO) Take by mouth.     baclofen (LIORESAL) 10 MG tablet Take by mouth as needed.     CALCIUM CARBONATE-VITAMIN D PO Take 1 tablet by mouth daily. 1000 mg     cephALEXin (KEFLEX) 500 MG capsule Take 1 capsule (500 mg total) by mouth 2 (two) times daily for 5 days. 10 capsule 0   famotidine (PEPCID) 40 MG tablet Take 40 mg by mouth 2 (two) times daily.      hydrocortisone 2.5 % cream Apply to affected areas BID PRN as directed. 30 g 3   hydrOXYzine (ATARAX) 25 MG tablet Take 37.5 mg by mouth at bedtime  as needed.     ketorolac (TORADOL) 10 MG tablet Take 1 tablet (10 mg total) by mouth every 6 (six) hours as needed for moderate pain. 12 tablet 0   letrozole (FEMARA) 2.5 MG tablet TAKE 1 TABLET BY MOUTH EVERY DAY 90 tablet 3   levocetirizine (XYZAL) 5 MG tablet Take 5 mg by mouth at bedtime.   3   levothyroxine (SYNTHROID) 75 MCG tablet TAKE ONE TABLET BY MOUTH ONE TIME DAILY BEFORE BREAKFAST 90 tablet 1   Magnesium Oxide 400 (240 Mg) MG TABS Take 400 mg by mouth daily.      metFORMIN (GLUCOPHAGE-XR) 500 MG 24 hr tablet TAKE 1 TABLET BY MOUTH EVERY DAY WITH BREAKFAST 90 tablet 1   Multiple Vitamin (MULTIVITAMIN WITH MINERALS) TABS tablet Take 1 tablet by mouth daily.     nabumetone (RELAFEN) 500 MG tablet Take  500 mg by mouth 2 (two) times daily.     neomycin-polymyxin b-dexamethasone (MAXITROL) 3.5-10000-0.1 SUSP 1 drop 3 (three) times daily.     Omega-3 Fatty Acids (FISH OIL) 1000 MG CAPS Take 1,000 mg by mouth daily.      OnabotulinumtoxinA (BOTOX IJ) Inject 1 Dose as directed every 6 (six) weeks.      ondansetron (ZOFRAN ODT) 4 MG disintegrating tablet Take 1 tablet (4 mg total) by mouth every 8 (eight) hours as needed. 20 tablet 0   pantoprazole (PROTONIX) 40 MG tablet TAKE ONE TABLET BY MOUTH ONE TIME DAILY 90 tablet 0   predniSONE (DELTASONE) 20 MG tablet Take 3 tablets (60 mg total) by mouth daily with breakfast. 9 tablet 0   PRESCRIPTION MEDICATION Inject into the skin every 6 (six) weeks. Steroid Trigger     Probiotic Product (PROBIOTIC-10 PO) Take 1 capsule by mouth daily.      promethazine (PHENERGAN) 25 MG tablet Take 25 mg by mouth daily as needed.     triamcinolone (KENALOG) 0.1 % Apply to affected areas BID PRN for up to two weeks at a time as directed. 45 g 3   TURMERIC PO Take 1 tablet by mouth daily.     rizatriptan (MAXALT) 10 MG tablet Take 1 tablet (10 mg total) by mouth once as needed for up to 10 days for migraine. May repeat in 2 hours if needed 10 tablet 0   No current facility-administered medications on file prior to visit.    Review of Systems  Constitutional:  Positive for appetite change and fatigue. Negative for fever.  HENT:  Positive for congestion, postnasal drip, rhinorrhea, sinus pressure, sneezing and sore throat. Negative for ear pain.   Eyes:  Negative for pain and discharge.  Respiratory:  Positive for cough. Negative for shortness of breath, wheezing and stridor.   Cardiovascular:  Negative for chest pain.  Gastrointestinal:  Negative for diarrhea, nausea and vomiting.  Genitourinary:  Negative for frequency, hematuria and urgency.  Musculoskeletal:  Negative for arthralgias and myalgias.  Skin:  Negative for rash.  Neurological:  Positive for headaches.  Negative for dizziness, weakness and light-headedness.  Psychiatric/Behavioral:  Negative for confusion and dysphoric mood.        Objective:   Physical Exam Constitutional:      General: She is not in acute distress.    Appearance: Normal appearance. She is well-developed and normal weight. She is not ill-appearing, toxic-appearing or diaphoretic.  HENT:     Head: Normocephalic and atraumatic.     Comments: Nares are injected and congested      Right Ear:  Tympanic membrane, ear canal and external ear normal.     Left Ear: Tympanic membrane, ear canal and external ear normal.     Nose: Congestion and rhinorrhea present.     Mouth/Throat:     Mouth: Mucous membranes are moist.     Pharynx: Oropharynx is clear. No oropharyngeal exudate or posterior oropharyngeal erythema.     Comments: Clear pnd  Eyes:     General: No scleral icterus.       Right eye: No discharge.        Left eye: No discharge.     Conjunctiva/sclera: Conjunctivae normal.     Pupils: Pupils are equal, round, and reactive to light.  Cardiovascular:     Rate and Rhythm: Normal rate.     Heart sounds: Normal heart sounds.  Pulmonary:     Effort: Pulmonary effort is normal. No respiratory distress.     Breath sounds: Normal breath sounds. No stridor. No wheezing, rhonchi or rales.  Chest:     Chest wall: No tenderness.  Abdominal:     General: Abdomen is flat. Bowel sounds are normal.     Tenderness: There is no right CVA tenderness or left CVA tenderness.     Comments: No suprapubic tenderness or fullness  No cva tenderness   Musculoskeletal:     Cervical back: Normal range of motion and neck supple.  Lymphadenopathy:     Cervical: No cervical adenopathy.  Skin:    General: Skin is warm and dry.     Capillary Refill: Capillary refill takes less than 2 seconds.     Findings: No rash.  Neurological:     Mental Status: She is alert.     Cranial Nerves: No cranial nerve deficit.  Psychiatric:        Mood  and Affect: Mood normal.           Assessment & Plan:   Problem List Items Addressed This Visit       Respiratory   Viral URI with cough    Several days Neg covid tests at home and no fever Reassuring exam  Suspect viral  Disc symptom care Sent in 10 tramadol for cough with caution of sedation  Guaifen prn  ER precautions discussed  Update if not starting to improve in a week or if worsening          Genitourinary   Acute cystitis with hematuria - Primary    Reviewed [facility] notes/ ua and cx  She did improve with keflex almost immediately  Despite contamination on cx I suspect this was uti  Today there was blood on dip but micro is completely clear  Will continue to monitor  Would consider re check ua for protein in future when she can give larger sample  If blood re occurs consider ref to urology for further eval      Relevant Orders   POCT Urinalysis Dipstick (Automated) (Completed)   POCT UA - Microscopic Only (Completed)

## 2022-12-23 NOTE — Assessment & Plan Note (Signed)
Reviewed uc notes/ ua and cx  She did improve with keflex almost immediately  Despite contamination on cx I suspect this was uti  Today there was blood on dip but micro is completely clear  Will continue to monitor  Would consider re check ua for protein in future when she can give larger sample  If blood re occurs consider ref to urology for further eval

## 2022-12-23 NOTE — Assessment & Plan Note (Signed)
Several days Neg covid tests at home and no fever Reassuring exam  Suspect viral  Disc symptom care Sent in 10 tramadol for cough with caution of sedation  Guaifen prn  ER precautions discussed  Update if not starting to improve in a week or if worsening

## 2022-12-23 NOTE — Patient Instructions (Addendum)
Drink fluids and rest  Mucinex  is good for cough and congestion  Can tramadol as needed for cough with caution of sedation  Nasal saline for congestion as needed  Tylenol for fever or pain or headache  Please alert [place] if symptoms worsen (if severe or short of breath please go to the ER)     Glad your urinary symptoms are better  Let us know if they return  Keep up water intake   Update if not starting to improve in a week or if worsening

## 2022-12-24 ENCOUNTER — Telehealth: Payer: Self-pay | Admitting: Family Medicine

## 2022-12-24 ENCOUNTER — Other Ambulatory Visit: Payer: Self-pay

## 2022-12-24 ENCOUNTER — Encounter (HOSPITAL_BASED_OUTPATIENT_CLINIC_OR_DEPARTMENT_OTHER): Payer: Self-pay

## 2022-12-24 ENCOUNTER — Emergency Department (HOSPITAL_BASED_OUTPATIENT_CLINIC_OR_DEPARTMENT_OTHER)
Admission: EM | Admit: 2022-12-24 | Discharge: 2022-12-24 | Disposition: A | Discharge status: Home / Self Care | Payer: Medicare HMO | Attending: Emergency Medicine | Admitting: Emergency Medicine

## 2022-12-24 DIAGNOSIS — I10 Essential (primary) hypertension: Secondary | ICD-10-CM | POA: Insufficient documentation

## 2022-12-24 DIAGNOSIS — Z79899 Other long term (current) drug therapy: Secondary | ICD-10-CM | POA: Diagnosis not present

## 2022-12-24 DIAGNOSIS — G43109 Migraine with aura, not intractable, without status migrainosus: Secondary | ICD-10-CM | POA: Insufficient documentation

## 2022-12-24 DIAGNOSIS — E039 Hypothyroidism, unspecified: Secondary | ICD-10-CM | POA: Diagnosis not present

## 2022-12-24 DIAGNOSIS — Z7984 Long term (current) use of oral hypoglycemic drugs: Secondary | ICD-10-CM | POA: Insufficient documentation

## 2022-12-24 DIAGNOSIS — Z853 Personal history of malignant neoplasm of breast: Secondary | ICD-10-CM | POA: Insufficient documentation

## 2022-12-24 DIAGNOSIS — R42 Dizziness and giddiness: Secondary | ICD-10-CM | POA: Diagnosis not present

## 2022-12-24 DIAGNOSIS — G43909 Migraine, unspecified, not intractable, without status migrainosus: Secondary | ICD-10-CM | POA: Diagnosis not present

## 2022-12-24 DIAGNOSIS — M542 Cervicalgia: Secondary | ICD-10-CM | POA: Diagnosis not present

## 2022-12-24 DIAGNOSIS — E119 Type 2 diabetes mellitus without complications: Secondary | ICD-10-CM | POA: Insufficient documentation

## 2022-12-24 DIAGNOSIS — R519 Headache, unspecified: Secondary | ICD-10-CM | POA: Diagnosis present

## 2022-12-24 DIAGNOSIS — J45909 Unspecified asthma, uncomplicated: Secondary | ICD-10-CM | POA: Diagnosis not present

## 2022-12-24 DIAGNOSIS — G4489 Other headache syndrome: Secondary | ICD-10-CM | POA: Diagnosis not present

## 2022-12-24 DIAGNOSIS — R110 Nausea: Secondary | ICD-10-CM | POA: Diagnosis not present

## 2022-12-24 MED ORDER — KETOROLAC TROMETHAMINE 15 MG/ML IJ SOLN
15.0000 mg | Freq: Once | INTRAMUSCULAR | Status: AC
  Administered 2022-12-24: 15 mg via INTRAVENOUS
  Filled 2022-12-24: qty 1

## 2022-12-24 MED ORDER — PREDNISONE 20 MG PO TABS: ORAL_TABLET | ORAL | 0 refills | Status: DC

## 2022-12-24 MED ORDER — SODIUM CHLORIDE 0.9 % IV BOLUS
1000.0000 mL | Freq: Once | INTRAVENOUS | Status: AC
  Administered 2022-12-24: 1000 mL via INTRAVENOUS

## 2022-12-24 MED ORDER — ONDANSETRON HCL 4 MG/2ML IJ SOLN
4.0000 mg | Freq: Once | INTRAMUSCULAR | Status: AC
  Administered 2022-12-24: 4 mg via INTRAVENOUS
  Filled 2022-12-24: qty 2

## 2022-12-24 MED ORDER — METHYLPREDNISOLONE SODIUM SUCC 125 MG IJ SOLR
125.0000 mg | Freq: Once | INTRAMUSCULAR | Status: AC
  Administered 2022-12-24: 125 mg via INTRAVENOUS
  Filled 2022-12-24: qty 2

## 2022-12-24 NOTE — ED Provider Notes (Signed)
[facility] Provider Note: Georgena Spurling, MD, FACEP  CSN: [id number] MRN: [id number] ARRIVAL: 12/24/22 at [place]: [id number]   CHIEF COMPLAINT  Migraine   HISTORY OF PRESENT ILLNESS  12/24/22 5:04 AM Jennifer [name] is a 67 y.o. female with a history of frequent migraines.  She was recently diagnosed with urinary tract infection and has been on Keflex for 5 days.  She suspects the stress of her urinary tract infection and difficulty sleeping has triggered another migraine episode.  She has had intermittent migraine like headaches for the past 5 days.  She has had transient relief with Maxalt but her most recent dose did not improve her headache.  She has taken ketorolac 10 mg without relief.  She has taken Zofran which has helped with her nausea.  She has had some vomiting with this as well.  She has associated photophobia.  Her headache is currently involving her entire head pain is moderate.  She states the only IV drugs she can tolerate for migraines are Toradol, Zofran and Solu-Medrol.   Past Medical History:  Diagnosis Date   Allergic rhinitis    Allergy    seasonal   Anemia    Anxiety    Arthritis    hands, knees   Asthma    "as a child"   Blood transfusion without reported diagnosis    "as a child"   Breast cancer ([place]) 2019   Right Breast Cancer   Cancer ([place]) 09/2018   Breast CA new DX, right breast   Cervical dysplasia    Common bile duct dilation    Diabetes mellitus without complication ([place])    Endometriosis    Esophageal reflux    Family history of adverse reaction to anesthesia    younger sister anxiety for a dew days after   Family history of breast cancer    Family history of breast cancer    Family history of prostate cancer    Hepatic cyst 08/2020   multiple   Hepatic hemangioma 08/2020   intrahepatic hemangioma   Hiatal hernia    Hypertension    pt.denies 10/19/22   Hypothyroid    Migraine with aura, without mention of intractable  migraine without mention of status migrainosus    Osteopenia 08/2019   T score -2.2 FRAX 11% / 1.7%   Personal history of radiation therapy 2019   Right Breast Cancer   Pre-diabetes    Raynaud's disease     Past Surgical History:  Procedure Laterality Date   BIOPSY  09/12/2020   Procedure: BIOPSY;  Surgeon: [name], MD;  Location: [facility];  Service: Endoscopy;;   BREAST BIOPSY Left 11/27/2022   MM LT BREAST BX W LOC DEV 1ST LESION IMAGE BX SPEC STEREO GUIDE 11/27/2022 [facility]   BREAST EXCISIONAL BIOPSY Bilateral over 10 years ago   benign x 2    BREAST LUMPECTOMY Right 10/10/2018   BREAST LUMPECTOMY WITH RADIOACTIVE SEED AND SENTINEL LYMPH NODE BIOPSY Right 10/10/2018   Procedure: RIGHT BREAST LUMPECTOMY WITH RADIOACTIVE SEED AND RIGHT SENTINEL LYMPH NODE BIOPSY;  Surgeon: [name], MD;  Location: [place];  Service: General;  Laterality: Right;   BREAST SURGERY     Benign breast lump excised   CERVICAL [place]E BIOPSY  1985   severe dysplasia   COLONOSCOPY  2005 and dec 2019   normal   COLPOSCOPY     endoscopy  10/2018   ESOPHAGOGASTRODUODENOSCOPY (EGD) WITH PROPOFOL N/A 09/12/2020  Procedure: ESOPHAGOGASTRODUODENOSCOPY (EGD) WITH PROPOFOL;  Surgeon: [name], MD;  Location: [facility];  Service: Endoscopy;  Laterality: N/A;   EUS N/A 09/12/2020   Procedure: UPPER ENDOSCOPIC ULTRASOUND (EUS) RADIAL;  Surgeon: [name], MD;  Location: [facility];  Service: Endoscopy;  Laterality: N/A;   LAPAROSCOPIC ASSISTED VAGINAL HYSTERECTOMY     LAPAROSCOPIC BILATERAL SALPINGO OOPHERECTOMY Bilateral 10/27/2019   Procedure: LAPAROSCOPIC BILATERAL SALPINGO OOPHORECTOMY WITH PERITONEAL WASHINGS;  Surgeon: Princess Bruins, MD;  Location: [place];  Service: Gynecology;  Laterality: Bilateral;  request 1:00pm on Friday, Dec. 4th in [place] time held requests one hour OR time   moles removed from upper  back, face lft, 1 between breasts, upper leg left inside  10/18/2019   wearing small round bandaids on for 2 weeks   ROTATOR CUFF REPAIR Bilateral 08/2008   UPPER GASTROINTESTINAL ENDOSCOPY      Family History  Problem Relation Age of Onset   Breast cancer Mother 73   Diabetes Mother    Irritable bowel syndrome Mother    Hypertension Father    Heart disease Father    Hypertension Sister    Anuerysm Sister        head   Irritable bowel syndrome Sister    Hypertension Sister    Anxiety disorder Sister    Other Sister        prediabetes   Celiac disease Brother    Osteoporosis Maternal Grandmother    Diabetes Maternal Grandmother    COPD Paternal Grandmother    Heart disease Paternal Grandfather    Breast cancer Cousin 29       pat first cousin   Breast cancer Cousin        mat second cousin with breast cancer in her 85s   Thyroid cancer Cousin 67       pat first cousin   Prostate cancer Other 90   Colon cancer Neg Hx    Stomach cancer Neg Hx    Esophageal cancer Neg Hx    Pancreatic cancer Neg Hx    Rectal cancer Neg Hx    Colon polyps Neg Hx    Crohn's disease Neg Hx    Ulcerative colitis Neg Hx     Social History   Tobacco Use   Smoking status: Never    Passive exposure: Never   Smokeless tobacco: Never  Vaping Use   Vaping Use: Never used  Substance Use Topics   Alcohol use: No    Alcohol/week: 0.0 standard drinks of alcohol   Drug use: No    Prior to Admission medications   Medication Sig Start Date End Date Taking? Authorizing Provider  ALPRAZolam (XANAX) 0.5 MG tablet TAKE 1/2 TO 1 TABLET BY MOUTH ONCE DAILY AS NEEDED FOR SLEEP/ANXIETY 10/22/22   Tower, Marne A, MD  atenolol (TENORMIN) 25 MG tablet Take 12.5 mg by mouth 3 (three) times daily. For migraine prevention    [provider]  B Complex Vitamins (B COMPLEX PO) Take by mouth.    [provider]  baclofen (LIORESAL) 10 MG tablet Take by mouth as needed. 11/18/21   [provider]  CALCIUM CARBONATE-VITAMIN D PO Take 1 tablet by mouth daily. 1000 mg    [provider]  cephALEXin (KEFLEX) 500 MG capsule Take 1 capsule (500 mg total) by mouth 2 (two) times daily for 5 days. 12/19/22 12/24/22  Sharion Balloon, NP  famotidine (PEPCID) 40 MG tablet Take 40 mg  by mouth 2 (two) times daily.     [provider]  hydrocortisone 2.5 % cream Apply to affected areas BID PRN as directed. 10/23/20   Moye, [place], MD  hydrOXYzine (ATARAX) 25 MG tablet Take 37.5 mg by mouth at bedtime as needed.    [provider]  ketorolac (TORADOL) 10 MG tablet Take 1 tablet (10 mg total) by mouth every 6 (six) hours as needed for moderate pain. 09/07/20   Carrie Mew, MD  letrozole [facility]) 2.5 MG tablet TAKE 1 TABLET BY MOUTH EVERY DAY 09/02/22   Nicholas Lose, MD  levocetirizine (XYZAL) 5 MG tablet Take 5 mg by mouth at bedtime.  10/28/17   [provider]  levothyroxine (SYNTHROID) 75 MCG tablet TAKE ONE TABLET BY MOUTH ONE TIME DAILY BEFORE BREAKFAST 09/04/22   Tower, [name], MD  Magnesium Oxide 400 (240 Mg) MG TABS Take 400 mg by mouth daily.     [provider]  metFORMIN (GLUCOPHAGE-XR) 500 MG 24 hr tablet TAKE 1 TABLET BY MOUTH EVERY DAY WITH BREAKFAST 12/08/22   Tower, [name], MD  Multiple Vitamin (MULTIVITAMIN WITH MINERALS) TABS tablet Take 1 tablet by mouth daily.    [provider]  nabumetone (RELAFEN) 500 MG tablet Take 500 mg by mouth 2 (two) times daily. 09/03/22   [provider]  neomycin-polymyxin b-dexamethasone (MAXITROL) 3.5-10000-0.1 SUSP 1 drop 3 (three) times daily. 08/27/22   [provider]  Omega-3 Fatty Acids (FISH OIL) 1000 MG CAPS Take 1,000 mg by mouth daily.     [provider]  OnabotulinumtoxinA (BOTOX IJ) Inject 1 Dose as directed every 6 (six) weeks.     [provider]  ondansetron (ZOFRAN ODT) 4 MG disintegrating tablet Take 1 tablet (4 mg total) by mouth  every 8 (eight) hours as needed. 10/08/20   Rudene Re, MD  pantoprazole (PROTONIX) 40 MG tablet TAKE ONE TABLET BY MOUTH ONE TIME DAILY 09/23/22   Tower, [name], MD  predniSONE (DELTASONE) 20 MG tablet Take 60 mg (3 tablets) daily for 3 days. 12/24/22   Zynia Wojtowicz, MD  PRESCRIPTION MEDICATION Inject into the skin every 6 (six) weeks. Steroid Trigger    [provider]  Probiotic Product (PROBIOTIC-10 PO) Take 1 capsule by mouth daily.     [provider]  promethazine (PHENERGAN) 25 MG tablet Take 25 mg by mouth daily as needed. 10/12/22   [provider]  rizatriptan (MAXALT) 10 MG tablet Take 1 tablet (10 mg total) by mouth once as needed for up to 10 days for migraine. May repeat in 2 hours if needed 10/15/21 01/28/22  Tower, [name], MD  traMADol (ULTRAM) 50 MG tablet Take 0.5 tablets (25 mg total) by mouth every 8 (eight) hours as needed (cough). 12/23/22   Tower, [name], MD  triamcinolone (KENALOG) 0.1 % Apply to affected areas BID PRN for up to two weeks at a time as directed. 10/23/20   Moye, [place], MD  TURMERIC PO Take 1 tablet by mouth daily.    [provider]    Allergies Ciprofloxacin, Codeine phosphate, Decongestant [pseudoephedrine], Diclofenac, Flonase [fluticasone propionate], Hydrocodone, Macrobid [nitrofurantoin], Nasonex [mometasone], Reglan [metoclopramide], Topamax [topiramate], Benadryl [diphenhydramine hcl], and Sulfa antibiotics   REVIEW OF SYSTEMS  Negative except as noted here or in the History of Present Illness.   PHYSICAL EXAMINATION  Initial Vital Signs Blood pressure (!) 146/80, pulse 71, temperature 98.6 F (37 C), temperature source Oral, resp. rate 18, height '5\' 1"'$  (  1.549 m), weight 60.5 kg, SpO2 98 %.  Examination General: Well-developed, well-nourished female in no acute distress; appearance consistent with age of record HENT: normocephalic; atraumatic Eyes: pupils equal, round and reactive to light;  extraocular muscles intact Neck: supple Heart: regular rate and rhythm Lungs: clear to auscultation bilaterally Abdomen: soft; nondistended; nontender; bowel sounds present Extremities: No deformity; full range of motion; pulses normal Neurologic: Awake, alert and oriented; motor function intact in all extremities and symmetric; no facial droop Skin: Warm and dry Psychiatric: Flat affect   RESULTS  Summary of this visit's results, reviewed and interpreted by myself:   EKG Interpretation  Date/Time:    Ventricular Rate:    PR Interval:    QRS Duration:   QT Interval:    QTC Calculation:   R Axis:     Text Interpretation:         Laboratory Studies: Results for orders placed or performed in visit on 12/23/22 (from the past 24 hour(s))  POCT Urinalysis Dipstick (Automated)     Status: Abnormal   Collection Time: 12/23/22  2:17 PM  Result Value Ref Range   Color, UA Yellow    Clarity, UA Clear    Glucose, UA Negative Negative   Bilirubin, UA 4 mg/dL    Ketones, UA 5 mg/dL    Spec Grav, UA 1.015 1.010 - 1.025   Blood, UA 10  Ery/uL    pH, UA 6.0 5.0 - 8.0   Protein, UA Positive (A) Negative   Urobilinogen, UA 0.2 0.2 or 1.0 E.U./dL   Nitrite, UA Negative    Leukocytes, UA Negative Negative  POCT UA - Microscopic Only     Status: Normal   Collection Time: 12/23/22  7:48 PM  Result Value Ref Range   WBC, Ur, HPF, POC none 0 - 5   RBC, Urine, Miroscopic none 0 - 2   Bacteria, U Microscopic none None - Trace   Mucus, UA few    Epithelial cells, urine per micros few    Crystals, Ur, HPF, POC none    Casts, Ur, LPF, POC none    Yeast, UA     Imaging Studies: No results found.  ED COURSE and MDM  Nursing notes, initial and subsequent vitals signs, including pulse oximetry, reviewed and interpreted by myself.  Vitals:   12/24/22 0515 12/24/22 0530 12/24/22 0548 12/24/22 0600  BP:   (!) 152/74 136/79  Pulse: 75 76    Resp:      Temp:      TempSrc:      SpO2:  99% 99%    Weight:      Height:       Medications  sodium chloride 0.9 % bolus 1,000 mL (1,000 mLs Intravenous New Bag/Given 12/24/22 0524)  ondansetron (ZOFRAN) injection 4 mg (4 mg Intravenous Given 12/24/22 0525)  ketorolac (TORADOL) 15 MG/ML injection 15 mg (15 mg Intravenous Given 12/24/22 0525)  methylPREDNISolone sodium succinate (SOLU-MEDROL) 125 mg/2 mL injection 125 mg (125 mg Intravenous Given 12/24/22 0526)   6:11 AM Patient feeling better.  Her migraine has not completely abated but she does not wish any additional medications at this time.  She would like a 3-day course of prednisone which she states she usually helps her recover from a particularly bad episode.   PROCEDURES  Procedures   ED DIAGNOSES     ICD-10-CM   1. Complicated migraine  C58.527          Billijo Dilling, Jenny Reichmann, MD 12/24/22  0614  

## 2022-12-24 NOTE — ED Notes (Signed)
Pt. Has been discharged, but won't leave room 10. Dr.Molpus made aware that pt. Is c/o continuing "disorientation," her ED stay being" so short," and her concern that we didn't check her electrolytes to see if she was dehydrate. Burney Gauze charge nurse also made aware. I explained to pt. that if felt she needed continued treatment she would need to sign back in.

## 2022-12-24 NOTE — ED Triage Notes (Signed)
Bib ems from home, c/o 5 day on and off migraine, no relief with home meds. Radiates to neck. Alert and oriented x 4, amb to bed in room, NAD, GCS 15

## 2022-12-24 NOTE — ED Notes (Signed)
Pt c/o pain at IV site, primary RN carol and this RN assessed site and no abnormalities noted. IV site changed per pt request.

## 2022-12-24 NOTE — ED Notes (Signed)
Pt. States her headache is better but she feels "woozy"  VSS, NAD

## 2022-12-25 NOTE — Telephone Encounter (Signed)
Error

## 2022-12-28 ENCOUNTER — Other Ambulatory Visit: Payer: Self-pay | Admitting: Family Medicine

## 2022-12-28 ENCOUNTER — Ambulatory Visit: Payer: Medicare HMO | Admitting: Gastroenterology

## 2022-12-29 ENCOUNTER — Ambulatory Visit (INDEPENDENT_AMBULATORY_CARE_PROVIDER_SITE_OTHER)
Admission: RE | Admit: 2022-12-29 | Discharge: 2022-12-29 | Disposition: A | Discharge status: Home / Self Care | Payer: Medicare HMO | Source: Ambulatory Visit | Attending: Family Medicine | Admitting: Family Medicine

## 2022-12-29 ENCOUNTER — Ambulatory Visit (INDEPENDENT_AMBULATORY_CARE_PROVIDER_SITE_OTHER): Payer: Medicare HMO | Admitting: Family Medicine

## 2022-12-29 ENCOUNTER — Encounter: Payer: Self-pay | Admitting: Family Medicine

## 2022-12-29 VITALS — BP 112/74 | HR 62 | Temp 97.3°F | Ht 61.0 in | Wt 131.4 lb

## 2022-12-29 DIAGNOSIS — J209 Acute bronchitis, unspecified: Secondary | ICD-10-CM | POA: Diagnosis not present

## 2022-12-29 DIAGNOSIS — R0789 Other chest pain: Secondary | ICD-10-CM | POA: Diagnosis not present

## 2022-12-29 MED ORDER — PREDNISONE 10 MG PO TABS: ORAL_TABLET | ORAL | 0 refills | Status: DC

## 2022-12-29 NOTE — Assessment & Plan Note (Signed)
More than 7 d into viral uri and cough worsened Reassuring exam  Some rhonchi / mild and no sob  Cxr ordered - normal result Prednisone 20 mg daily for 5 d Declines inhaler  Continue guiafen and saline and tramadol for cough Disc sympt care Disc ER precautions Update if not starting to improve in a week or if worsening

## 2022-12-29 NOTE — Progress Notes (Signed)
Subjective:    Patient ID: Jennifer [name], female    DOB: 01/17/1956, 67 y.o.   MRN: [id number]  HPI Pt presents with uri symptoms   Wt Readings from Last 3 Encounters:  12/29/22 131 lb 6 oz (59.6 kg)  12/24/22 133 lb 6.1 oz (60.5 kg)  12/23/22 133 lb 6 oz (60.5 kg)   24.82 kg/m  Vitals:   12/29/22 1001  BP: 112/74  Pulse: 62  Temp: (!) 97.3 F (36.3 C)  SpO2: 98%   Has had her cold symptoms well over a week  Last visit noted some congestion and cough with light yellow phlegm  No fever Covid tests were negative   Still has a cough  Got worse on Thursday  Some chest tightness Phlegm is fairly clear  Not wheezing  No sob but avoids very deep breath due to cough   No fever   No ST  No ear pain but did pop r ear after she blew her nose  No sinus pain   Was on prednisone 60 mg for 2 days for headache   Guaifenesin Uses 1/4 of a tramadol at night or as needed Nasal rinse   Reassuring cxr DG Chest 2 View  Result Date: 12/29/2022 CLINICAL DATA:  Bronchitis and chest tightness. Viral URI for over a week. EXAM: CHEST - 2 VIEW COMPARISON:  Chest x-ray dated October 08, 2020. FINDINGS: The heart size and mediastinal contours are within normal limits. Both lungs are clear. The visualized skeletal structures are unremarkable. IMPRESSION: No active cardiopulmonary disease. Electronically Signed   By: Titus Dubin M.D.   On: 12/29/2022 10:41     Patient Active Problem List   Diagnosis Date Noted   Acute bronchitis 12/29/2022   Pain in right shoulder 08/12/2022   Hordeolum externum (stye) 08/04/2022   Raynaud's disease 03/17/2022   Left facial swelling 01/25/2022   Left ear pain 01/23/2022   Acute cystitis with hematuria 11/26/2021   Right leg pain 10/15/2021   Microcytic anemia 07/07/2021   De Quervain's tenosynovitis, right 04/18/2021   Iron deficiency anemia 09/08/2020   Common bile duct dilatation 09/06/2020   Opacity of lung on imaging study 09/06/2020    Dizziness 07/10/2020   History of breast cancer 03/01/2020   Genetic testing 11/14/2019   Bilateral hand pain 11/03/2019   Family history of prostate cancer    Adnexal cyst 09/26/2019   Family history of breast cancer    Malignant neoplasm of upper-inner quadrant of right breast in female, estrogen receptor positive ([facility]) 09/20/2018   B12 deficiency 04/13/2018   Joint pain 04/11/2018   Colon cancer screening 09/16/2017   Prediabetes 07/28/2016   Allergic rhinitis 11/13/2014   Screening for lipoid disorders 09/26/2014   Preventative health care 09/26/2014   Dysuria 08/07/2014   Routine general medical examination at a health care facility 10/28/2012   Viral URI with cough 03/03/2012   GERD 09/16/2010   Hypothyroidism 05/07/2008   Osteopenia 05/07/2008   Migraine with aura 11/11/2007   Past Medical History:  Diagnosis Date   Allergic rhinitis    Allergy    seasonal   Anemia    Anxiety    Arthritis    hands, knees   Asthma    "as a child"   Blood transfusion without reported diagnosis    "as a child"   Breast cancer ([facility]) 2019   Right Breast Cancer   Cancer ([facility]) 09/2018   Breast CA new DX, right breast  Cervical dysplasia    Common bile duct dilation    Diabetes mellitus without complication (HCC)    Endometriosis    Esophageal reflux    Family history of adverse reaction to anesthesia    younger sister anxiety for a dew days after   Family history of breast cancer    Family history of breast cancer    Family history of prostate cancer    Hepatic cyst 08/2020   multiple   Hepatic hemangioma 08/2020   intrahepatic hemangioma   Hiatal hernia    Hypertension    pt.denies 10/19/22   Hypothyroid    Migraine with aura, without mention of intractable migraine without mention of status migrainosus    Osteopenia 08/2019   T score -2.2 FRAX 11% / 1.7%   Personal history of radiation therapy 2019   Right Breast Cancer   Pre-diabetes    Raynaud's disease    Past  Surgical History:  Procedure Laterality Date   BIOPSY  09/12/2020   Procedure: BIOPSY;  Surgeon: [name], MD;  Location: [facility];  Service: Endoscopy;;   BREAST BIOPSY Left 11/27/2022   MM LT BREAST BX W LOC DEV 1ST LESION IMAGE BX SPEC STEREO GUIDE 11/27/2022 [facility]   BREAST EXCISIONAL BIOPSY Bilateral over 10 years ago   benign x 2    BREAST LUMPECTOMY Right 10/10/2018   BREAST LUMPECTOMY WITH RADIOACTIVE SEED AND SENTINEL LYMPH NODE BIOPSY Right 10/10/2018   Procedure: RIGHT BREAST LUMPECTOMY WITH RADIOACTIVE SEED AND RIGHT SENTINEL LYMPH NODE BIOPSY;  Surgeon: [name], MD;  Location: [facility];  Service: General;  Laterality: Right;   BREAST SURGERY     Benign breast lump excised   CERVICAL CONE BIOPSY  1985   severe dysplasia   COLONOSCOPY  2005 and dec 2019   normal   COLPOSCOPY     endoscopy  10/2018   ESOPHAGOGASTRODUODENOSCOPY (EGD) WITH PROPOFOL N/A 09/12/2020   Procedure: ESOPHAGOGASTRODUODENOSCOPY (EGD) WITH PROPOFOL;  Surgeon: [name], MD;  Location: [facility];  Service: Endoscopy;  Laterality: N/A;   EUS N/A 09/12/2020   Procedure: UPPER ENDOSCOPIC ULTRASOUND (EUS) RADIAL;  Surgeon: [name], MD;  Location: [facility];  Service: Endoscopy;  Laterality: N/A;   LAPAROSCOPIC ASSISTED VAGINAL HYSTERECTOMY     LAPAROSCOPIC BILATERAL SALPINGO OOPHERECTOMY Bilateral 10/27/2019   Procedure: LAPAROSCOPIC BILATERAL SALPINGO OOPHORECTOMY WITH PERITONEAL WASHINGS;  Surgeon: Princess Bruins, MD;  Location: [facility];  Service: Gynecology;  Laterality: Bilateral;  request 1:00pm on Friday, Dec. 4th in [place] time held requests one hour OR time   moles removed from upper back, face lft, 1 between breasts, upper leg left inside  10/18/2019   wearing small round bandaids on for 2 weeks   ROTATOR CUFF REPAIR Bilateral 08/2008   UPPER GASTROINTESTINAL ENDOSCOPY     Social History    Tobacco Use   Smoking status: Never    Passive exposure: Never   Smokeless tobacco: Never  Vaping Use   Vaping Use: Never used  Substance Use Topics   Alcohol use: No    Alcohol/week: 0.0 standard drinks of alcohol   Drug use: No   Family History  Problem Relation Age of Onset   Breast cancer Mother 64   Diabetes Mother    Irritable bowel syndrome Mother    Hypertension Father    Heart disease Father    Hypertension Sister    Anuerysm Sister  head   Irritable bowel syndrome Sister    Hypertension Sister    Anxiety disorder Sister    Other Sister        prediabetes   Celiac disease Brother    Osteoporosis Maternal Grandmother    Diabetes Maternal Grandmother    COPD Paternal Grandmother    Heart disease Paternal Grandfather    Breast cancer Cousin 47       pat first cousin   Breast cancer Cousin        mat second cousin with breast cancer in her 13s   Thyroid cancer Cousin 38       pat first cousin   Prostate cancer Other 63   Colon cancer Neg Hx    Stomach cancer Neg Hx    Esophageal cancer Neg Hx    Pancreatic cancer Neg Hx    Rectal cancer Neg Hx    Colon polyps Neg Hx    Crohn's disease Neg Hx    Ulcerative colitis Neg Hx    Allergies  Allergen Reactions   Ciprofloxacin Other (See Comments)    Dizziness    Codeine Phosphate Other (See Comments)    Dizziness, heart palpitations, nervous   Decongestant [Pseudoephedrine] Other (See Comments)    dizziness   Diclofenac    Flonase [Fluticasone Propionate] Other (See Comments)    Worsens her migraine    Hydrocodone Other (See Comments)    Made nervous--10/09 surgery rotator cuff   Macrobid [Nitrofurantoin]     headaches   Nasonex [Mometasone]     Migraines   Reglan [Metoclopramide] Other (See Comments)    Dizziness   Topamax [Topiramate] Other (See Comments)    Dizziness    Benadryl [Diphenhydramine Hcl] Palpitations   Sulfa Antibiotics Anxiety   Current Outpatient Medications on File  Prior to Visit  Medication Sig Dispense Refill   ALPRAZolam (XANAX) 0.5 MG tablet TAKE 1/2 TO 1 TABLET BY MOUTH ONCE DAILY AS NEEDED FOR SLEEP/ANXIETY 15 tablet 2   atenolol (TENORMIN) 25 MG tablet Take 12.5 mg by mouth 3 (three) times daily. For migraine prevention     B Complex Vitamins (B COMPLEX PO) Take by mouth.     baclofen (LIORESAL) 10 MG tablet Take by mouth as needed.     CALCIUM CARBONATE-VITAMIN D PO Take 1 tablet by mouth daily. 1000 mg     famotidine (PEPCID) 40 MG tablet Take 40 mg by mouth 2 (two) times daily.      hydrocortisone 2.5 % cream Apply to affected areas BID PRN as directed. 30 g 3   hydrOXYzine (ATARAX) 25 MG tablet Take 37.5 mg by mouth at bedtime as needed.     ketorolac (TORADOL) 10 MG tablet Take 1 tablet (10 mg total) by mouth every 6 (six) hours as needed for moderate pain. 12 tablet 0   letrozole (FEMARA) 2.5 MG tablet TAKE 1 TABLET BY MOUTH EVERY DAY 90 tablet 3   levocetirizine (XYZAL) 5 MG tablet Take 5 mg by mouth at bedtime.   3   levothyroxine (SYNTHROID) 75 MCG tablet TAKE ONE TABLET BY MOUTH ONE TIME DAILY BEFORE BREAKFAST 90 tablet 1   Magnesium Oxide 400 (240 Mg) MG TABS Take 400 mg by mouth daily.      metFORMIN (GLUCOPHAGE-XR) 500 MG 24 hr tablet TAKE 1 TABLET BY MOUTH EVERY DAY WITH BREAKFAST 90 tablet 1   Multiple Vitamin (MULTIVITAMIN WITH MINERALS) TABS tablet Take 1 tablet by mouth daily.     nabumetone (RELAFEN)  500 MG tablet Take 500 mg by mouth 2 (two) times daily.     neomycin-polymyxin b-dexamethasone (MAXITROL) 3.5-10000-0.1 SUSP 1 drop 3 (three) times daily.     Omega-3 Fatty Acids (FISH OIL) 1000 MG CAPS Take 1,000 mg by mouth daily.      OnabotulinumtoxinA (BOTOX IJ) Inject 1 Dose as directed every 6 (six) weeks.      ondansetron (ZOFRAN ODT) 4 MG disintegrating tablet Take 1 tablet (4 mg total) by mouth every 8 (eight) hours as needed. 20 tablet 0   pantoprazole (PROTONIX) 40 MG tablet TAKE ONE TABLET BY MOUTH ONE TIME DAILY 90  tablet 0   PRESCRIPTION MEDICATION Inject into the skin every 6 (six) weeks. Steroid Trigger     Probiotic Product (PROBIOTIC-10 PO) Take 1 capsule by mouth daily.      promethazine (PHENERGAN) 25 MG tablet Take 25 mg by mouth daily as needed.     traMADol (ULTRAM) 50 MG tablet Take 0.5 tablets (25 mg total) by mouth every 8 (eight) hours as needed (cough). 10 tablet 0   triamcinolone (KENALOG) 0.1 % Apply to affected areas BID PRN for up to two weeks at a time as directed. 45 g 3   TURMERIC PO Take 1 tablet by mouth daily.     rizatriptan (MAXALT) 10 MG tablet Take 1 tablet (10 mg total) by mouth once as needed for up to 10 days for migraine. May repeat in 2 hours if needed 10 tablet 0   No current facility-administered medications on file prior to visit.     Review of Systems  Constitutional:  Negative for activity change, appetite change, fatigue, fever and unexpected weight change.  HENT:  Positive for congestion, postnasal drip and rhinorrhea. Negative for ear pain, sinus pressure, sinus pain and sore throat.   Eyes:  Negative for pain, redness and visual disturbance.  Respiratory:  Positive for cough and chest tightness. Negative for choking, shortness of breath, wheezing and stridor.   Cardiovascular:  Negative for chest pain and palpitations.  Gastrointestinal:  Negative for abdominal pain, blood in stool, constipation and diarrhea.  Endocrine: Negative for polydipsia and polyuria.  Genitourinary:  Negative for dysuria, frequency and urgency.  Musculoskeletal:  Negative for arthralgias, back pain and myalgias.  Skin:  Negative for pallor and rash.  Allergic/Immunologic: Negative for environmental allergies.  Neurological:  Positive for headaches. Negative for dizziness and syncope.  Hematological:  Negative for adenopathy. Does not bruise/bleed easily.  Psychiatric/Behavioral:  Negative for decreased concentration and dysphoric mood. The patient is not nervous/anxious.         Objective:   Physical Exam Constitutional:      General: She is not in acute distress.    Appearance: Normal appearance. She is well-developed and normal weight. She is not ill-appearing, toxic-appearing or diaphoretic.  HENT:     Head: Normocephalic and atraumatic.     Comments: Nares are injected and congested      Right Ear: Tympanic membrane, ear canal and external ear normal.     Left Ear: Tympanic membrane, ear canal and external ear normal.     Nose: Congestion and rhinorrhea present.     Mouth/Throat:     Mouth: Mucous membranes are moist.     Pharynx: Oropharynx is clear. No oropharyngeal exudate or posterior oropharyngeal erythema.     Comments: Clear pnd  Eyes:     General:        Right eye: No discharge.  Left eye: No discharge.     Conjunctiva/sclera: Conjunctivae normal.     Pupils: Pupils are equal, round, and reactive to light.  Cardiovascular:     Rate and Rhythm: Normal rate.     Heart sounds: Normal heart sounds.  Pulmonary:     Effort: Pulmonary effort is normal. No respiratory distress.     Breath sounds: No stridor. Rhonchi present. No wheezing or rales.     Comments: Few scattered rhonci No wheeze even on forced expiration  Chest:     Chest wall: No tenderness.  Musculoskeletal:     Cervical back: Normal range of motion and neck supple.  Lymphadenopathy:     Cervical: No cervical adenopathy.  Skin:    General: Skin is warm and dry.     Capillary Refill: Capillary refill takes less than 2 seconds.     Findings: No rash.  Neurological:     Mental Status: She is alert.     Cranial Nerves: No cranial nerve deficit.  Psychiatric:        Mood and Affect: Mood normal.           Assessment & Plan:   Problem List Items Addressed This Visit       Respiratory   Acute bronchitis - Primary    More than 7 d into viral uri and cough worsened Reassuring exam  Some rhonchi / mild and no sob  Cxr ordered - normal result Prednisone 20 mg daily  for 5 d Declines inhaler  Continue guiafen and saline and tramadol for cough Disc sympt care Disc ER precautions Update if not starting to improve in a week or if worsening        Relevant Orders   DG Chest 2 View (Completed)

## 2022-12-29 NOTE — Patient Instructions (Addendum)
Let's do a chest xray today to rule out pneumonia  We will call you with the result or you will see it on mychart  Take the low dose prednisone for bronchitis If you feel wheezing or need an inhaler let us know If symptoms get severe or you have trouble breathing go to the ER    Drink fluids Continue the expectorant   Update if not starting to improve in a week or if worsening

## 2022-12-31 ENCOUNTER — Telehealth: Payer: Self-pay

## 2022-12-31 ENCOUNTER — Encounter: Payer: Self-pay | Admitting: Family Medicine

## 2022-12-31 NOTE — Telephone Encounter (Signed)
        Patient  visited [facility] on 2/1   Telephone encounter attempt :  1st  A HIPAA compliant voice message was left requesting a return call.  Instructed patient to call back    [facility] [phone] [address], [place], [place] [address] Phone: [phone] Email: [email or web address]

## 2023-01-01 ENCOUNTER — Telehealth: Payer: Self-pay

## 2023-01-01 NOTE — Telephone Encounter (Signed)
        Patient  visited [facility] on 2/1  Telephone encounter attempt : 1st   A HIPAA compliant voice message was left requesting a return call.  Instructed patient to call back .    [facility] [phone] [address], [place], [place] [address] Phone: [phone] Email: [email or web address]

## 2023-01-04 ENCOUNTER — Telehealth: Payer: Self-pay

## 2023-01-04 NOTE — Telephone Encounter (Signed)
        Patient  visited [facility] on 2/1    Telephone encounter attempt :  2nd  A HIPAA compliant voice message was left requesting a return call.  Instructed patient to call back   [facility] [phone] [address], [place], [place] [address] Phone: [phone] Email: [email or web address]

## 2023-01-06 ENCOUNTER — Encounter: Payer: Self-pay | Admitting: Family Medicine

## 2023-01-09 ENCOUNTER — Emergency Department
Admission: EM | Admit: 2023-01-09 | Discharge: 2023-01-09 | Disposition: A | Discharge status: Home / Self Care | Payer: Medicare HMO | Attending: Emergency Medicine | Admitting: Emergency Medicine

## 2023-01-09 ENCOUNTER — Other Ambulatory Visit: Payer: Self-pay

## 2023-01-09 DIAGNOSIS — G43809 Other migraine, not intractable, without status migrainosus: Secondary | ICD-10-CM | POA: Diagnosis not present

## 2023-01-09 DIAGNOSIS — G43909 Migraine, unspecified, not intractable, without status migrainosus: Secondary | ICD-10-CM | POA: Diagnosis not present

## 2023-01-09 MED ORDER — DEXAMETHASONE SODIUM PHOSPHATE 10 MG/ML IJ SOLN
10.0000 mg | Freq: Once | INTRAMUSCULAR | Status: AC
  Administered 2023-01-09: 10 mg via INTRAVENOUS
  Filled 2023-01-09: qty 1

## 2023-01-09 MED ORDER — SODIUM CHLORIDE 0.9 % IV BOLUS
1000.0000 mL | Freq: Once | INTRAVENOUS | Status: AC
  Administered 2023-01-09: 1000 mL via INTRAVENOUS

## 2023-01-09 MED ORDER — KETOROLAC TROMETHAMINE 15 MG/ML IJ SOLN
15.0000 mg | Freq: Once | INTRAMUSCULAR | Status: AC
  Administered 2023-01-09: 15 mg via INTRAVENOUS
  Filled 2023-01-09: qty 1

## 2023-01-09 NOTE — ED Provider Notes (Signed)
[facility] Provider Note    Event Date/Time   First MD Initiated Contact with Patient 01/09/23 0701     (approximate)   History   Migraine (/)   HPI  Jennifer [name] is a 67 y.o. female past medical history significant for migraine headaches who presents to the emergency department with a headache.  She states that this is her typical migraine headache.  Daily headaches in the morning for the past 4 days associated with nausea and photophobia.  States that this is consistent with her prior migraine headaches.  No falls or trauma.  No change in vision.  No temporal tenderness or pain with chewing.  Denies any chest pain or shortness of breath.  No fever or chills.  Followed by headache center and gets Botox injections and steroid injections every 6 weeks     Physical Exam   Triage Vital Signs: ED Triage Vitals  Enc Vitals Group     BP 01/09/23 0628 (!) 159/67     Pulse Rate 01/09/23 0628 78     Resp 01/09/23 0628 18     Temp 01/09/23 0628 97.7 F (36.5 C)     Temp Source 01/09/23 0628 Oral     SpO2 01/09/23 0628 98 %     Weight 01/09/23 0629 130 lb (59 kg)     Height 01/09/23 0629 5' 1"$  (1.549 m)     Head Circumference --      Peak Flow --      Pain Score 01/09/23 0629 3     Pain Loc --      Pain Edu? --      Excl. in [place]? --     Most recent vital signs: Vitals:   01/09/23 0628 01/09/23 0706  BP: (!) 159/67 124/82  Pulse: 78 66  Resp: 18   Temp: 97.7 F (36.5 C)   SpO2: 98% 100%    Physical Exam Constitutional:      Appearance: She is well-developed.  HENT:     Head: Atraumatic.  Eyes:     Conjunctiva/sclera: Conjunctivae normal.  Cardiovascular:     Rate and Rhythm: Regular rhythm.  Pulmonary:     Effort: No respiratory distress.  Abdominal:     General: There is no distension.  Musculoskeletal:        General: Normal range of motion.     Cervical back: Normal range of motion. No rigidity.  Skin:    General: Skin is warm.   Neurological:     Mental Status: She is alert. Mental status is at baseline.     Comments: Cranial nerves grossly intact.  5/5 strength bilateral upper and lower extremities.  Sensation intact in upper and lower extremities.  Normal gait.      IMPRESSION / MDM / ASSESSMENT AND PLAN / ED COURSE  I reviewed the triage vital signs and the nursing notes.  On chart review patient has a history of chronic migraine headaches and is followed by headache center.  Unable to view the notes from the headache center.  I can see on prior ED visit she has a history of migraines and has been treated with ketorolac, IV fluids and Solu-Medrol with prednisone taper in the past.  Differential diagnosis is most concerning for chronic migraine headache, rebound headache and other primary headaches.  Clinical picture is not consistent with meningitis, no neck stiffness, altered mental status or fever.  No recent falls or trauma have a low suspicion for  intracranial hemorrhage.  Nonfocal exam, not consistent with CVA.  Clinical picture is not consistent with a giant cell arteritis.  Labs (all labs ordered are listed, but only abnormal results are displayed) Labs interpreted as -    Labs Reviewed - No data to display  Patient was treated with 1 L of IV fluids, IV ketorolac and IV Decadron  On reevaluation patient had resolution of her headache.  Low suspicion for cerebral venous thrombosis given that her headache is not intractable.  Discussed close follow-up with her headache clinic.  Given return precautions for any worsening symptoms.     PROCEDURES:  Critical Care performed: No  Procedures  Patient's presentation is most consistent with acute presentation with potential threat to life or bodily function.   MEDICATIONS ORDERED IN ED: Medications  sodium chloride 0.9 % bolus 1,000 mL (1,000 mLs Intravenous New Bag/Given 01/09/23 0735)  dexamethasone (DECADRON) injection 10 mg (10 mg Intravenous  Given 01/09/23 0728)  ketorolac (TORADOL) 15 MG/ML injection 15 mg (15 mg Intravenous Given 01/09/23 0728)    FINAL CLINICAL IMPRESSION(S) / ED DIAGNOSES   Final diagnoses:  Other migraine without status migrainosus, not intractable     Rx / DC Orders   ED Discharge Orders     None        Note:  This document was prepared using Dragon voice recognition software and may include unintentional dictation errors.   Nathaniel Man, MD 01/09/23 [phone]

## 2023-01-09 NOTE — Discharge Instructions (Addendum)
You were seen in the emergency department for migraine headaches.  You were given 1 L of IV fluids, IV ketorolac (Toradol) and IV Decadron (10 mg - this is a steroid).  Follow-up closely with your primary headache specialist.

## 2023-01-09 NOTE — ED Triage Notes (Signed)
Pt reports migraine onset Wednesday with hx of same. Reports short term relief after taking Triptan at home Wednesday but that it has been consistently reoccurring since then. Pt alert and oriented on arrival. Ambulatory to triage. Breathing unlabored speaking in full sentences.

## 2023-01-12 ENCOUNTER — Ambulatory Visit: Payer: Medicare HMO | Admitting: Gastroenterology

## 2023-01-12 ENCOUNTER — Encounter: Payer: Self-pay | Admitting: Gastroenterology

## 2023-01-12 VITALS — BP 128/80 | HR 72 | Ht 61.0 in | Wt 134.0 lb

## 2023-01-12 DIAGNOSIS — K219 Gastro-esophageal reflux disease without esophagitis: Secondary | ICD-10-CM | POA: Diagnosis not present

## 2023-01-12 MED ORDER — PANTOPRAZOLE SODIUM 20 MG PO TBEC: 20.0000 mg | DELAYED_RELEASE_TABLET | Freq: Every day | ORAL | 5 refills | Status: DC

## 2023-01-12 NOTE — Patient Instructions (Signed)
_______________________________________________________  If your blood pressure at your visit was 140/90 or greater, please contact your primary care physician to follow up on this.  _______________________________________________________  If you are age 67 or older, your body mass index should be between 23-30. Your Body mass index is 25.32 kg/m. If this is out of the aforementioned range listed, please consider follow up with your Primary Care Provider.  If you are age 53 or younger, your body mass index should be between 19-25. Your Body mass index is 25.32 kg/m. If this is out of the aformentioned range listed, please consider follow up with your Primary Care Provider.   ________________________________________________________  The [facility] providers would like to encourage you to use [facility] to communicate with providers for non-urgent requests or questions.  Due to long hold times on the telephone, sending your provider a message by [facility] may be a faster and more efficient way to get a response.  Please allow 48 business hours for a response.  Please remember that this is for non-urgent requests.  _______________________________________________________  We have sent the following medications to your pharmacy for you to pick up at your convenience: Pantoprazole 20 mg   Increase Pepcid to twice daily  Send [place] a message with an update on how you are doing.

## 2023-01-12 NOTE — Progress Notes (Signed)
01/12/2023 Jennifer [name] [id number] [birth year]   HISTORY OF PRESENT ILLNESS: This is a 67 year old female who is a patient Dr. Tarri Glenn.  She is here for follow-up after her EGD.  EGD was 11/18/2022 she was found to have a medium sized hiatal hernia and multiple fundic gland polyps (confirmed on biopsies).  She is on pantoprazole 40 mg daily and Pepcid 40 mg daily.  In regards to her acid reflux she says that she feels well without any symptoms.  She does have osteopenia.  She says that in the past she had not done well with decreasing the pantoprazole to 20 mg daily, but she was also not on Pepcid at that time.   Past Medical History:  Diagnosis Date   Allergic rhinitis    Allergy    seasonal   Anemia    Anxiety    Arthritis    hands, knees   Asthma    "as a child"   Blood transfusion without reported diagnosis    "as a child"   Breast cancer ([place]) 2019   Right Breast Cancer   Cancer ([place]) 09/2018   Breast CA new DX, right breast   Cervical dysplasia    Common bile duct dilation    Diabetes mellitus without complication ([place])    Endometriosis    Esophageal reflux    Family history of adverse reaction to anesthesia    younger sister anxiety for a dew days after   Family history of breast cancer    Family history of breast cancer    Family history of prostate cancer    Hepatic cyst 08/2020   multiple   Hepatic hemangioma 08/2020   intrahepatic hemangioma   Hiatal hernia    Hypertension    pt.denies 10/19/22   Hypothyroid    Migraine with aura, without mention of intractable migraine without mention of status migrainosus    Osteopenia 08/2019   T score -2.2 FRAX 11% / 1.7%   Personal history of radiation therapy 2019   Right Breast Cancer   Pre-diabetes    Raynaud's disease    Past Surgical History:  Procedure Laterality Date   BIOPSY  09/12/2020   Procedure: BIOPSY;  Surgeon: [name], MD;  Location: [facility];  Service: Endoscopy;;   BREAST BIOPSY  Left 11/27/2022   MM LT BREAST BX W LOC DEV 1ST LESION IMAGE BX SPEC STEREO GUIDE 11/27/2022 [facility]   BREAST EXCISIONAL BIOPSY Bilateral over 10 years ago   benign x 2    BREAST LUMPECTOMY Right 10/10/2018   BREAST LUMPECTOMY WITH RADIOACTIVE SEED AND SENTINEL LYMPH NODE BIOPSY Right 10/10/2018   Procedure: RIGHT BREAST LUMPECTOMY WITH RADIOACTIVE SEED AND RIGHT SENTINEL LYMPH NODE BIOPSY;  Surgeon: [name], MD;  Location: [facility];  Service: General;  Laterality: Right;   BREAST SURGERY     Benign breast lump excised   CERVICAL [place]E BIOPSY  1985   severe dysplasia   COLONOSCOPY  2005 and dec 2019   normal   COLPOSCOPY     endoscopy  10/2018   ESOPHAGOGASTRODUODENOSCOPY (EGD) WITH PROPOFOL N/A 09/12/2020   Procedure: ESOPHAGOGASTRODUODENOSCOPY (EGD) WITH PROPOFOL;  Surgeon: [name], MD;  Location: [facility];  Service: Endoscopy;  Laterality: N/A;   EUS N/A 09/12/2020   Procedure: UPPER ENDOSCOPIC ULTRASOUND (EUS) RADIAL;  Surgeon: [name], MD;  Location: [facility];  Service: Endoscopy;  Laterality: N/A;   LAPAROSCOPIC ASSISTED VAGINAL HYSTERECTOMY  LAPAROSCOPIC BILATERAL SALPINGO OOPHERECTOMY Bilateral 10/27/2019   Procedure: LAPAROSCOPIC BILATERAL SALPINGO OOPHORECTOMY WITH PERITONEAL WASHINGS;  Surgeon: Princess Bruins, MD;  Location: [facility];  Service: Gynecology;  Laterality: Bilateral;  request 1:00pm on Friday, Dec. 4th in [place] time held requests one hour OR time   moles removed from upper back, face lft, 1 between breasts, upper leg left inside  10/18/2019   wearing small round bandaids on for 2 weeks   ROTATOR CUFF REPAIR Bilateral 08/2008   UPPER GASTROINTESTINAL ENDOSCOPY      reports that she has never smoked. She has never been exposed to tobacco smoke. She has never used smokeless tobacco. She reports that she does not drink alcohol and does not use drugs. family history  includes Anuerysm in her sister; Anxiety disorder in her sister; Breast cancer in her cousin; Breast cancer (age of onset: 36) in her cousin; Breast cancer (age of onset: 65) in her mother; COPD in her paternal grandmother; Celiac disease in her brother; Diabetes in her maternal grandmother and mother; Heart disease in her father and paternal grandfather; Hypertension in her father, sister, and sister; Irritable bowel syndrome in her mother and sister; Osteoporosis in her maternal grandmother; Other in her sister; Prostate cancer (age of onset: 81) in an other family member; Thyroid cancer (age of onset: 13) in her cousin. Allergies  Allergen Reactions   Ciprofloxacin Other (See Comments)    Dizziness    Codeine Phosphate Other (See Comments)    Dizziness, heart palpitations, nervous   Decongestant [Pseudoephedrine] Other (See Comments)    dizziness   Diclofenac    Flonase [Fluticasone Propionate] Other (See Comments)    Worsens her migraine    Hydrocodone Other (See Comments)    Made nervous--10/09 surgery rotator cuff   Macrobid [Nitrofurantoin]     headaches   Nasonex [Mometasone]     Migraines   Reglan [Metoclopramide] Other (See Comments)    Dizziness   Topamax [Topiramate] Other (See Comments)    Dizziness    Benadryl [Diphenhydramine Hcl] Palpitations   Sulfa Antibiotics Anxiety      Outpatient Encounter Medications as of 01/12/2023  Medication Sig   ALPRAZolam (XANAX) 0.5 MG tablet TAKE 1/2 TO 1 TABLET BY MOUTH ONCE DAILY AS NEEDED FOR SLEEP/ANXIETY   atenolol (TENORMIN) 25 MG tablet Take 12.5 mg by mouth 3 (three) times daily. For migraine prevention   B Complex Vitamins (B COMPLEX PO) Take by mouth.   baclofen (LIORESAL) 10 MG tablet Take by mouth as needed.   CALCIUM CARBONATE-VITAMIN D PO Take 1 tablet by mouth daily. 1000 mg   famotidine (PEPCID) 40 MG tablet Take 40 mg by mouth 2 (two) times daily.    hydrocortisone 2.5 % cream Apply to affected areas BID PRN as  directed.   hydrOXYzine (ATARAX) 25 MG tablet Take 37.5 mg by mouth at bedtime as needed.   ketorolac (TORADOL) 10 MG tablet Take 1 tablet (10 mg total) by mouth every 6 (six) hours as needed for moderate pain.   letrozole (FEMARA) 2.5 MG tablet TAKE 1 TABLET BY MOUTH EVERY DAY   levocetirizine (XYZAL) 5 MG tablet Take 5 mg by mouth at bedtime.    levothyroxine (SYNTHROID) 75 MCG tablet TAKE ONE TABLET BY MOUTH ONE TIME DAILY BEFORE BREAKFAST   Magnesium Oxide 400 (240 Mg) MG TABS Take 400 mg by mouth daily.    metFORMIN (GLUCOPHAGE-XR) 500 MG 24 hr tablet TAKE 1 TABLET BY MOUTH EVERY DAY WITH  BREAKFAST   Multiple Vitamin (MULTIVITAMIN WITH MINERALS) TABS tablet Take 1 tablet by mouth daily.   nabumetone (RELAFEN) 500 MG tablet Take 500 mg by mouth 2 (two) times daily.   neomycin-polymyxin b-dexamethasone (MAXITROL) 3.5-10000-0.1 SUSP 1 drop 3 (three) times daily.   Omega-3 Fatty Acids (FISH OIL) 1000 MG CAPS Take 1,000 mg by mouth daily.    OnabotulinumtoxinA (BOTOX IJ) Inject 1 Dose as directed every 6 (six) weeks.    ondansetron (ZOFRAN ODT) 4 MG disintegrating tablet Take 1 tablet (4 mg total) by mouth every 8 (eight) hours as needed.   pantoprazole (PROTONIX) 20 MG tablet Take 1 tablet (20 mg total) by mouth daily.   predniSONE (DELTASONE) 10 MG tablet Take 2 pills once daily for 5 days   PRESCRIPTION MEDICATION Inject into the skin every 6 (six) weeks. Steroid Trigger   Probiotic Product (PROBIOTIC-10 PO) Take 1 capsule by mouth daily.    promethazine (PHENERGAN) 25 MG tablet Take 25 mg by mouth daily as needed.   traMADol (ULTRAM) 50 MG tablet Take 0.5 tablets (25 mg total) by mouth every 8 (eight) hours as needed (cough).   triamcinolone (KENALOG) 0.1 % Apply to affected areas BID PRN for up to two weeks at a time as directed.   TURMERIC PO Take 1 tablet by mouth daily.   [DISCONTINUED] pantoprazole (PROTONIX) 40 MG tablet TAKE ONE TABLET BY MOUTH ONE TIME DAILY   rizatriptan (MAXALT)  10 MG tablet Take 1 tablet (10 mg total) by mouth once as needed for up to 10 days for migraine. May repeat in 2 hours if needed   No facility-administered encounter medications on file as of 01/12/2023.     REVIEW OF SYSTEMS  : All other systems reviewed and negative except where noted in the History of Present Illness.   PHYSICAL EXAM: BP 128/80   Pulse 72   Ht 5' 1"$  (1.549 m)   Wt 134 lb (60.8 kg)   BMI 25.32 kg/m  General: Well developed white female in no acute distress Head: Normocephalic and atraumatic Eyes:  Sclerae anicteric, conjunctiva pink. Ears: Normal auditory acuity Lungs: Clear throughout to auscultation; no W/R/R. Heart: Regular rate and rhythm; no M/R/G. Abdomen: Soft, non-distended.  BS present.  Non-tender. Musculoskeletal: Symmetrical with no gross deformities  Skin: No lesions on visible extremities Extremities: No edema  Neurological: Alert oriented x 4, grossly non-focal Psychological:  Alert and cooperative. Normal mood and affect  ASSESSMENT AND PLAN: *GERD: Currently on pantoprazole 40 mg daily and Pepcid 40 mg daily.  She does have osteopenia.  We discussed minimizing her PPI use to the lowest dose possible.  She says that she did not do well on 20 mg in the past, but she also was not on the Pepcid at that time.  We talked about decreasing the pantoprazole dose to 20 mg daily in the mornings and increasing the Pepcid 20 mg twice daily as it is currently prescribed that way and see how she does with that.  She is willing to do so.  New prescription for pantoprazole 20 mg daily sent to her pharmacy.  Certainly if symptoms worsen again then she will go back to the current regimen.  She will keep [place] posted via MyChart.   CC:  Tower, [name], MD

## 2023-01-13 NOTE — Progress Notes (Signed)
Reviewed.  Masaye Gatchalian L. Jaylan Duggar, MD, MPH  

## 2023-01-14 ENCOUNTER — Telehealth: Payer: Self-pay | Admitting: *Deleted

## 2023-01-14 NOTE — Telephone Encounter (Signed)
        Patient  visited [facility] on 01/09/2023  for treatment    Telephone encounter attempt :  1st  A HIPAA compliant voice message was left requesting a return call.  Instructed patient to call back at [phone]. [place] [phone] [address] , [place] [address] Email : [email or web address]

## 2023-01-18 ENCOUNTER — Telehealth: Payer: Self-pay

## 2023-01-18 DIAGNOSIS — M542 Cervicalgia: Secondary | ICD-10-CM | POA: Diagnosis not present

## 2023-01-18 DIAGNOSIS — M791 Myalgia, unspecified site: Secondary | ICD-10-CM | POA: Diagnosis not present

## 2023-01-18 DIAGNOSIS — G43719 Chronic migraine without aura, intractable, without status migrainosus: Secondary | ICD-10-CM | POA: Diagnosis not present

## 2023-01-18 DIAGNOSIS — G518 Other disorders of facial nerve: Secondary | ICD-10-CM | POA: Diagnosis not present

## 2023-01-18 NOTE — Telephone Encounter (Signed)
Pt called and LVM voicing concerns regarding hematuria as detected via UA. She wants to know if there is concern for bladder cancer. Attempted to call pt back to discuss. LVM for call back.

## 2023-01-19 ENCOUNTER — Telehealth: Payer: Self-pay

## 2023-01-19 NOTE — Telephone Encounter (Signed)
Pt called back asking to speak with me. Returned pt's call and LVM for call back.

## 2023-01-20 ENCOUNTER — Telehealth: Payer: Self-pay

## 2023-01-20 NOTE — Telephone Encounter (Signed)
Pt returned call and we spoke regarding her concerns of hematuria last month. Pt denies anymore occurrences. She knows to call if this occurs again, and will be following up with Dr Glori Bickers. Advised pt a referral to urology or nephrology would be the appropriate route if this happens again. Pt states she is certain this came from her urethra and not her vagina. She was looking for reassurance from oncologic standpoint. She knows to call with any further concerns.

## 2023-01-21 ENCOUNTER — Encounter: Payer: Self-pay | Admitting: Family Medicine

## 2023-01-21 ENCOUNTER — Ambulatory Visit (INDEPENDENT_AMBULATORY_CARE_PROVIDER_SITE_OTHER): Payer: Medicare HMO | Admitting: Family Medicine

## 2023-01-21 VITALS — BP 118/76 | HR 68 | Temp 97.4°F | Ht 61.0 in | Wt 132.1 lb

## 2023-01-21 DIAGNOSIS — M25571 Pain in right ankle and joints of right foot: Secondary | ICD-10-CM | POA: Diagnosis not present

## 2023-01-21 DIAGNOSIS — M79641 Pain in right hand: Secondary | ICD-10-CM | POA: Diagnosis not present

## 2023-01-21 DIAGNOSIS — S46811A Strain of other muscles, fascia and tendons at shoulder and upper arm level, right arm, initial encounter: Secondary | ICD-10-CM

## 2023-01-21 DIAGNOSIS — S46819A Strain of other muscles, fascia and tendons at shoulder and upper arm level, unspecified arm, initial encounter: Secondary | ICD-10-CM | POA: Insufficient documentation

## 2023-01-21 DIAGNOSIS — R3129 Other microscopic hematuria: Secondary | ICD-10-CM

## 2023-01-21 DIAGNOSIS — M546 Pain in thoracic spine: Secondary | ICD-10-CM | POA: Insufficient documentation

## 2023-01-21 LAB — POC URINALSYSI DIPSTICK (AUTOMATED)
Bilirubin, UA: NEGATIVE
Blood, UA: NEGATIVE
Glucose, UA: NEGATIVE
Ketones, UA: NEGATIVE
Leukocytes, UA: NEGATIVE
Nitrite, UA: NEGATIVE
Protein, UA: NEGATIVE
Spec Grav, UA: 1.01 (ref 1.010–1.025)
Urobilinogen, UA: 0.2 E.U./dL
pH, UA: 6.5 (ref 5.0–8.0)

## 2023-01-21 NOTE — Assessment & Plan Note (Signed)
On the right side after a fall   Reassuring exam Recommend heat/TENS and stretching  Voltaren gel prn  Update if not starting to improve in a week or if worsening

## 2023-01-21 NOTE — Progress Notes (Signed)
Subjective:    Patient ID: Jennifer [name], female    DOB: Feb 01, 1956, 67 y.o.   MRN: [id number]  HPI Pt presents for injuries caused by a fall   Wants to re check urine dip for blood (was pos in past)   Wt Readings from Last 3 Encounters:  01/21/23 132 lb 2 oz (59.9 kg)  01/12/23 134 lb (60.8 kg)  01/09/23 130 lb (59 kg)   24.96 kg/m  Vitals:   01/21/23 1138  BP: 118/76  Pulse: 68  Temp: (!) 97.4 F (36.3 C)  SpO2: 100%     Fell yesterday Had a pedicure and had to use abx ointment on a toe - first thing in the am she lifted her toe to the sink Lost her balance and fell backwards   R ankle hit the sink area- got bruised  A little tender today Able to put weight on it today  Butt and back on bath mat   Arm right- tile  Hip-tile  Neck -was sore  Right side  Using TENS unit   Did not hit her head   She got up slowly over 5 minutes, assessed each areas    Going to [facility] tomorrow for her mother     Having a lot of migraines  Just had trigger point shots   Results for orders placed or performed in visit on 01/21/23  POCT Urinalysis Dipstick (Automated)  Result Value Ref Range   Color, UA Light Yellow    Clarity, UA Clear    Glucose, UA Negative Negative   Bilirubin, UA Negative    Ketones, UA Negative    Spec Grav, UA 1.010 1.010 - 1.025   Blood, UA Negative    pH, UA 6.5 5.0 - 8.0   Protein, UA Negative Negative   Urobilinogen, UA 0.2 0.2 or 1.0 E.U./dL   Nitrite, UA Negative    Leukocytes, UA Negative Negative     Patient Active Problem List   Diagnosis Date Noted   Right hand pain 01/21/2023   Right ankle pain 01/21/2023   Microscopic hematuria 01/21/2023   Acute bronchitis 12/29/2022   Pain in right shoulder 08/12/2022   Hordeolum externum (stye) 08/04/2022   Raynaud's disease 03/17/2022   Left facial swelling 01/25/2022   Left ear pain 01/23/2022   Acute cystitis with hematuria 11/26/2021   Right leg pain 10/15/2021   Microcytic  anemia 07/07/2021   De Quervain's tenosynovitis, right 04/18/2021   Iron deficiency anemia 09/08/2020   Common bile duct dilatation 09/06/2020   Opacity of lung on imaging study 09/06/2020   Dizziness 07/10/2020   History of breast cancer 03/01/2020   Genetic testing 11/14/2019   Bilateral hand pain 11/03/2019   Family history of prostate cancer    Adnexal cyst 09/26/2019   Family history of breast cancer    Malignant neoplasm of upper-inner quadrant of right breast in female, estrogen receptor positive ([place]) 09/20/2018   B12 deficiency 04/13/2018   Joint pain 04/11/2018   Colon cancer screening 09/16/2017   Prediabetes 07/28/2016   Allergic rhinitis 11/13/2014   Screening for lipoid disorders 09/26/2014   Preventative health care 09/26/2014   Dysuria 08/07/2014   Routine general medical examination at a health care facility 10/28/2012   GERD 09/16/2010   Hypothyroidism 05/07/2008   Osteopenia 05/07/2008   Migraine with aura 11/11/2007   Past Medical History:  Diagnosis Date   Allergic rhinitis    Allergy    seasonal   Anemia  Anxiety    Arthritis    hands, knees   Asthma    "as a child"   Blood transfusion without reported diagnosis    "as a child"   Breast cancer ([place]) 2019   Right Breast Cancer   Cancer ([place]) 09/2018   Breast CA new DX, right breast   Cervical dysplasia    Common bile duct dilation    Diabetes mellitus without complication ([place])    Endometriosis    Esophageal reflux    Family history of adverse reaction to anesthesia    younger sister anxiety for a dew days after   Family history of breast cancer    Family history of breast cancer    Family history of prostate cancer    Hepatic cyst 08/2020   multiple   Hepatic hemangioma 08/2020   intrahepatic hemangioma   Hiatal hernia    Hypertension    pt.denies 10/19/22   Hypothyroid    Migraine with aura, without mention of intractable migraine without mention of status migrainosus     Osteopenia 08/2019   T score -2.2 FRAX 11% / 1.7%   Personal history of radiation therapy 2019   Right Breast Cancer   Pre-diabetes    Raynaud's disease    Past Surgical History:  Procedure Laterality Date   BIOPSY  09/12/2020   Procedure: BIOPSY;  Surgeon: [name], MD;  Location: [facility];  Service: Endoscopy;;   BREAST BIOPSY Left 11/27/2022   MM LT BREAST BX W LOC DEV 1ST LESION IMAGE BX SPEC STEREO GUIDE 11/27/2022 [facility]   BREAST EXCISIONAL BIOPSY Bilateral over 10 years ago   benign x 2    BREAST LUMPECTOMY Right 10/10/2018   BREAST LUMPECTOMY WITH RADIOACTIVE SEED AND SENTINEL LYMPH NODE BIOPSY Right 10/10/2018   Procedure: RIGHT BREAST LUMPECTOMY WITH RADIOACTIVE SEED AND RIGHT SENTINEL LYMPH NODE BIOPSY;  Surgeon: [name], MD;  Location: [facility];  Service: General;  Laterality: Right;   BREAST SURGERY     Benign breast lump excised   CERVICAL CONE BIOPSY  1985   severe dysplasia   COLONOSCOPY  2005 and dec 2019   normal   COLPOSCOPY     endoscopy  10/2018   ESOPHAGOGASTRODUODENOSCOPY (EGD) WITH PROPOFOL N/A 09/12/2020   Procedure: ESOPHAGOGASTRODUODENOSCOPY (EGD) WITH PROPOFOL;  Surgeon: [name], MD;  Location: [facility];  Service: Endoscopy;  Laterality: N/A;   EUS N/A 09/12/2020   Procedure: UPPER ENDOSCOPIC ULTRASOUND (EUS) RADIAL;  Surgeon: [name], MD;  Location: [facility];  Service: Endoscopy;  Laterality: N/A;   LAPAROSCOPIC ASSISTED VAGINAL HYSTERECTOMY     LAPAROSCOPIC BILATERAL SALPINGO OOPHERECTOMY Bilateral 10/27/2019   Procedure: LAPAROSCOPIC BILATERAL SALPINGO OOPHORECTOMY WITH PERITONEAL WASHINGS;  Surgeon: Princess Bruins, MD;  Location: [facility];  Service: Gynecology;  Laterality: Bilateral;  request 1:00pm on Friday, Dec. 4th in [place] time held requests one hour OR time   moles removed from upper back, face lft, 1 between breasts, upper leg left  inside  10/18/2019   wearing small round bandaids on for 2 weeks   ROTATOR CUFF REPAIR Bilateral 08/2008   UPPER GASTROINTESTINAL ENDOSCOPY     Social History   Tobacco Use   Smoking status: Never    Passive exposure: Never   Smokeless tobacco: Never  Vaping Use   Vaping Use: Never used  Substance Use Topics   Alcohol use: No    Alcohol/week: 0.0 standard drinks of alcohol  Drug use: No   Family History  Problem Relation Age of Onset   Breast cancer Mother 64   Diabetes Mother    Irritable bowel syndrome Mother    Hypertension Father    Heart disease Father    Hypertension Sister    Anuerysm Sister        head   Irritable bowel syndrome Sister    Hypertension Sister    Anxiety disorder Sister    Other Sister        prediabetes   Celiac disease Brother    Osteoporosis Maternal Grandmother    Diabetes Maternal Grandmother    COPD Paternal Grandmother    Heart disease Paternal Grandfather    Breast cancer Cousin 6       pat first cousin   Breast cancer Cousin        mat second cousin with breast cancer in her 7s   Thyroid cancer Cousin 26       pat first cousin   Prostate cancer Other 37   Colon cancer Neg Hx    Stomach cancer Neg Hx    Esophageal cancer Neg Hx    Pancreatic cancer Neg Hx    Rectal cancer Neg Hx    Colon polyps Neg Hx    Crohn's disease Neg Hx    Ulcerative colitis Neg Hx    Allergies  Allergen Reactions   Ciprofloxacin Other (See Comments)    Dizziness    Codeine Phosphate Other (See Comments)    Dizziness, heart palpitations, nervous   Decongestant [Pseudoephedrine] Other (See Comments)    dizziness   Diclofenac    Flonase [Fluticasone Propionate] Other (See Comments)    Worsens her migraine    Hydrocodone Other (See Comments)    Made nervous--10/09 surgery rotator cuff   Macrobid [Nitrofurantoin]     headaches   Nasonex [Mometasone]     Migraines   Reglan [Metoclopramide] Other (See Comments)    Dizziness   Topamax  [Topiramate] Other (See Comments)    Dizziness    Benadryl [Diphenhydramine Hcl] Palpitations   Sulfa Antibiotics Anxiety   Current Outpatient Medications on File Prior to Visit  Medication Sig Dispense Refill   ALPRAZolam (XANAX) 0.5 MG tablet TAKE 1/2 TO 1 TABLET BY MOUTH ONCE DAILY AS NEEDED FOR SLEEP/ANXIETY 15 tablet 2   atenolol (TENORMIN) 25 MG tablet Take 12.5 mg by mouth 3 (three) times daily. For migraine prevention     B Complex Vitamins (B COMPLEX PO) Take by mouth.     baclofen (LIORESAL) 10 MG tablet Take by mouth as needed.     CALCIUM CARBONATE-VITAMIN D PO Take 1 tablet by mouth daily. 1000 mg     famotidine (PEPCID) 40 MG tablet Take 40 mg by mouth 2 (two) times daily.      hydrocortisone 2.5 % cream Apply to affected areas BID PRN as directed. 30 g 3   hydrOXYzine (ATARAX) 25 MG tablet Take 37.5 mg by mouth at bedtime as needed.     ketorolac (TORADOL) 10 MG tablet Take 1 tablet (10 mg total) by mouth every 6 (six) hours as needed for moderate pain. 12 tablet 0   letrozole (FEMARA) 2.5 MG tablet TAKE 1 TABLET BY MOUTH EVERY DAY 90 tablet 3   levocetirizine (XYZAL) 5 MG tablet Take 5 mg by mouth at bedtime.   3   levothyroxine (SYNTHROID) 75 MCG tablet TAKE ONE TABLET BY MOUTH ONE TIME DAILY BEFORE BREAKFAST 90 tablet 1  Magnesium Oxide 400 (240 Mg) MG TABS Take 400 mg by mouth daily.      metFORMIN (GLUCOPHAGE-XR) 500 MG 24 hr tablet TAKE 1 TABLET BY MOUTH EVERY DAY WITH BREAKFAST 90 tablet 1   Multiple Vitamin (MULTIVITAMIN WITH MINERALS) TABS tablet Take 1 tablet by mouth daily.     nabumetone (RELAFEN) 500 MG tablet Take 500 mg by mouth 2 (two) times daily.     neomycin-polymyxin b-dexamethasone (MAXITROL) 3.5-10000-0.1 SUSP 1 drop 3 (three) times daily.     Omega-3 Fatty Acids (FISH OIL) 1000 MG CAPS Take 1,000 mg by mouth daily.      OnabotulinumtoxinA (BOTOX IJ) Inject 1 Dose as directed every 6 (six) weeks.      ondansetron (ZOFRAN ODT) 4 MG disintegrating tablet  Take 1 tablet (4 mg total) by mouth every 8 (eight) hours as needed. 20 tablet 0   pantoprazole (PROTONIX) 20 MG tablet Take 1 tablet (20 mg total) by mouth daily. 30 tablet 5   predniSONE (DELTASONE) 10 MG tablet Take 2 pills once daily for 5 days 10 tablet 0   PRESCRIPTION MEDICATION Inject into the skin every 6 (six) weeks. Steroid Trigger     Probiotic Product (PROBIOTIC-10 PO) Take 1 capsule by mouth daily.      promethazine (PHENERGAN) 25 MG tablet Take 25 mg by mouth daily as needed.     traMADol (ULTRAM) 50 MG tablet Take 0.5 tablets (25 mg total) by mouth every 8 (eight) hours as needed (cough). 10 tablet 0   triamcinolone (KENALOG) 0.1 % Apply to affected areas BID PRN for up to two weeks at a time as directed. 45 g 3   TURMERIC PO Take 1 tablet by mouth daily.     rizatriptan (MAXALT) 10 MG tablet Take 1 tablet (10 mg total) by mouth once as needed for up to 10 days for migraine. May repeat in 2 hours if needed 10 tablet 0   No current facility-administered medications on file prior to visit.      Review of Systems  Constitutional:  Negative for activity change, appetite change, fatigue, fever and unexpected weight change.  HENT:  Negative for congestion, ear pain, rhinorrhea, sinus pressure and sore throat.   Eyes:  Negative for pain, redness and visual disturbance.  Respiratory:  Negative for cough, shortness of breath and wheezing.   Cardiovascular:  Negative for chest pain and palpitations.  Gastrointestinal:  Negative for abdominal pain, blood in stool, constipation and diarrhea.  Endocrine: Negative for polydipsia and polyuria.  Genitourinary:  Negative for dysuria, frequency and urgency.  Musculoskeletal:  Positive for arthralgias. Negative for back pain and myalgias.  Skin:  Negative for pallor and rash.  Allergic/Immunologic: Negative for environmental allergies.  Neurological:  Negative for dizziness, syncope and headaches.  Hematological:  Negative for adenopathy.  Does not bruise/bleed easily.  Psychiatric/Behavioral:  Negative for decreased concentration and dysphoric mood. The patient is not nervous/anxious.        Objective:   Physical Exam Constitutional:      General: She is not in acute distress.    Appearance: Normal appearance. She is normal weight. She is not ill-appearing.  Eyes:     General: No scleral icterus.    Conjunctiva/sclera: Conjunctivae normal.     Pupils: Pupils are equal, round, and reactive to light.  Cardiovascular:     Rate and Rhythm: Normal rate and regular rhythm.     Heart sounds: Normal heart sounds.  Pulmonary:     Effort:  Pulmonary effort is normal. No respiratory distress.     Breath sounds: Normal breath sounds. No wheezing.  Musculoskeletal:     Cervical back: Normal range of motion and neck supple. No rigidity or tenderness.     Right ankle: Ecchymosis present. No swelling or deformity. No tenderness. Decreased range of motion. Anterior drawer test negative. Normal pulse.     Right Achilles Tendon: Normal.     Left ankle: Normal.     Left Achilles Tendon: Normal.     Right foot: Normal.     Left foot: Normal.     Comments: Right ankle Mild erythema/bruising of medial malleolus  Nl rom  No other bony tenderness  Nl rom toes   R hand- mild tenderness of 2,3 MCP joints / slight warmth No tenderness of other areas and no swelling Nl rom wrist and fingers    Lymphadenopathy:     Cervical: No cervical adenopathy.  Skin:    General: Skin is warm and dry.     Coloration: Skin is not pale.     Findings: Bruising present. No rash.  Neurological:     Mental Status: She is alert.     Sensory: No sensory deficit.     Motor: No weakness.     Comments: Nl gait  Able to bear weight on both ankles and feet    Psychiatric:        Mood and Affect: Mood normal.           Assessment & Plan:   Problem List Items Addressed This Visit       Musculoskeletal and Integument   Trapezius muscle strain     On the right side after a fall   Reassuring exam Recommend heat/TENS and stretching  Voltaren gel prn  Update if not starting to improve in a week or if worsening          Genitourinary   RESOLVED: Microscopic hematuria    Re check ua today per pt req It is entirely clear  No symptoms  Can resolve problem      Relevant Orders   POCT Urinalysis Dipstick (Automated) (Completed)     Other   Right ankle pain    S/p fall with blunt trauma to medial malleolus  Reassuring exam Able to bear weight  Doubt fracture  Adv use of ice/elevate prn  Topical voltaren gel Update if not starting to improve in a week or if worsening        Right hand pain - Primary    After a fall with mild blunt trauma to dorsal hand  Mild tenderness over 2,3 mcp joints Otherwise nl exam  Do not suspect fracture If symptoms worsen- consider imaging   Adv ice prn/ relative rest and voltaren gel Update if not starting to improve in a week or if worsening

## 2023-01-21 NOTE — Patient Instructions (Addendum)
Continue ice on areas that are sore Use TENS and heat on the neck   If your ankle or hand pain worsen we can do an xray later   Be careful not to fall again   Topical voltaren gel on sore spots is a great idea   Update if not starting to improve in a week or if worsening    Let's re check urinalysis today

## 2023-01-21 NOTE — Assessment & Plan Note (Signed)
After a fall with mild blunt trauma to dorsal hand  Mild tenderness over 2,3 mcp joints Otherwise nl exam  Do not suspect fracture If symptoms worsen- consider imaging   Adv ice prn/ relative rest and voltaren gel Update if not starting to improve in a week or if worsening

## 2023-01-21 NOTE — Assessment & Plan Note (Addendum)
Re check ua today per pt req It is entirely clear  No symptoms  Can resolve problem

## 2023-01-21 NOTE — Assessment & Plan Note (Signed)
S/p fall with blunt trauma to medial malleolus  Reassuring exam Able to bear weight  Doubt fracture  Adv use of ice/elevate prn  Topical voltaren gel Update if not starting to improve in a week or if worsening

## 2023-01-22 ENCOUNTER — Encounter: Payer: Self-pay | Admitting: *Deleted

## 2023-01-25 DIAGNOSIS — M25511 Pain in right shoulder: Secondary | ICD-10-CM | POA: Diagnosis not present

## 2023-01-25 DIAGNOSIS — G43809 Other migraine, not intractable, without status migrainosus: Secondary | ICD-10-CM | POA: Diagnosis not present

## 2023-01-25 DIAGNOSIS — R9431 Abnormal electrocardiogram [ECG] [EKG]: Secondary | ICD-10-CM | POA: Diagnosis not present

## 2023-01-27 ENCOUNTER — Ambulatory Visit: Payer: Medicare HMO | Admitting: Gastroenterology

## 2023-02-02 ENCOUNTER — Ambulatory Visit (INDEPENDENT_AMBULATORY_CARE_PROVIDER_SITE_OTHER): Payer: Medicare HMO | Admitting: Obstetrics & Gynecology

## 2023-02-02 ENCOUNTER — Encounter: Payer: Self-pay | Admitting: Obstetrics & Gynecology

## 2023-02-02 ENCOUNTER — Other Ambulatory Visit (HOSPITAL_COMMUNITY)
Admission: RE | Admit: 2023-02-02 | Discharge: 2023-02-02 | Disposition: A | Discharge status: Home / Self Care | Payer: Medicare HMO | Source: Ambulatory Visit | Attending: Obstetrics & Gynecology | Admitting: Obstetrics & Gynecology

## 2023-02-02 VITALS — BP 110/68 | HR 64 | Ht 59.75 in | Wt 130.0 lb

## 2023-02-02 DIAGNOSIS — Z01419 Encounter for gynecological examination (general) (routine) without abnormal findings: Secondary | ICD-10-CM

## 2023-02-02 DIAGNOSIS — Z780 Asymptomatic menopausal state: Secondary | ICD-10-CM

## 2023-02-02 DIAGNOSIS — Z9889 Other specified postprocedural states: Secondary | ICD-10-CM

## 2023-02-02 DIAGNOSIS — M8580 Other specified disorders of bone density and structure, unspecified site: Secondary | ICD-10-CM | POA: Diagnosis not present

## 2023-02-02 DIAGNOSIS — Z853 Personal history of malignant neoplasm of breast: Secondary | ICD-10-CM

## 2023-02-02 DIAGNOSIS — Z9189 Other specified personal risk factors, not elsewhere classified: Secondary | ICD-10-CM | POA: Diagnosis not present

## 2023-02-02 DIAGNOSIS — Z124 Encounter for screening for malignant neoplasm of cervix: Secondary | ICD-10-CM

## 2023-02-02 DIAGNOSIS — Z9289 Personal history of other medical treatment: Secondary | ICD-10-CM | POA: Diagnosis not present

## 2023-02-02 NOTE — Progress Notes (Signed)
Jennifer [name] [birth year] [id number]   History:    67 y.o. G0    RP:  Established patient presenting for annual gyn exam    HPI: S/P LPS BSO/Peritoneal washings 10/27/2019.  Postmenopause on no HRT.  No PMB. No pelvic pain. Abstinent.  Pap 01/2022 Neg.  Remote h/o Cervical Conization per patient in the 80's, pap normal thereafter.  Pap reflex today.  H/O Rt Breast invasive Ductal Ca grade 1 with ER positive.  Rt breast lumpectomy 09/2018 with Radioactive seed.  On Letrozole 2.5 mg daily currently, will stop this year. Mammo 11/19/22.  Lt breast Bx 11/27/22 Benign.  BMI 25.6.  Health labs with Fam MD.  Harriet Masson 07/2021.  BD Osteopenia 09/2021 Rt Femoral Neck T-Score -2.4.  Frax not increased. Repeat BD in 09/2023. Continue Vit D, Ca++ 1.5 g/d total and regular weight bearing physical activities.   Past medical history,surgical history, family history and social history were all reviewed and documented in the EPIC chart.  Gynecologic History No LMP recorded. Patient has had a hysterectomy.  Obstetric History OB History  Gravida Para Term Preterm AB Living  0 0 0 0 0 0  SAB IAB Ectopic Multiple Live Births  0 0 0 0 0     ROS: A ROS was performed and pertinent positives and negatives are included in the history. GENERAL: No fevers or chills. HEENT: No change in vision, no earache, sore throat or sinus congestion. NECK: No pain or stiffness. CARDIOVASCULAR: No chest pain or pressure. No palpitations. PULMONARY: No shortness of breath, cough or wheeze. GASTROINTESTINAL: No abdominal pain, nausea, vomiting or diarrhea, melena or bright red blood per rectum. GENITOURINARY: No urinary frequency, urgency, hesitancy or dysuria. MUSCULOSKELETAL: No joint or muscle pain, no back pain, no recent trauma. DERMATOLOGIC: No rash, no itching, no lesions. ENDOCRINE: No polyuria, polydipsia, no heat or cold intolerance. No recent change in weight. HEMATOLOGICAL: No anemia or easy bruising or bleeding. NEUROLOGIC: No  headache, seizures, numbness, tingling or weakness. PSYCHIATRIC: No depression, no loss of interest in normal activity or change in sleep pattern.     Exam:   BP 110/68   Pulse 64   Ht 4' 11.75" (1.518 m)   Wt 130 lb (59 kg)   SpO2 98%   BMI 25.60 kg/m   Body mass index is 25.6 kg/m.  General appearance : Well developed well nourished female. No acute distress HEENT: Eyes: no retinal hemorrhage or exudates,  Neck supple, trachea midline, no carotid bruits, no thyroidmegaly Lungs: Clear to auscultation, no rhonchi or wheezes, or rib retractions  Heart: Regular rate and rhythm, no murmurs or gallops Breast:Examined in sitting and supine position were symmetrical in appearance, no palpable masses or tenderness,  no skin retraction, no nipple inversion, no nipple discharge, no skin discoloration, no axillary or supraclavicular lymphadenopathy Abdomen: no palpable masses or tenderness, no rebound or guarding Extremities: no edema or skin discoloration or tenderness  Pelvic: Vulva: Normal             Vagina: No gross lesions or discharge  Cervix: No gross lesions or discharge.  Pap reflex done.  Uterus  AV, normal size, shape and consistency, non-tender and mobile  Adnexa  Without masses or tenderness  Anus: Normal   Assessment/Plan:  67 y.o. female for annual exam   1. Encounter for routine gynecological examination with Papanicolaou smear of cervix S/P LPS BSO/Peritoneal washings 10/27/2019.  Postmenopause on no HRT.  No PMB. No pelvic pain. Abstinent.  Pap  01/2022 Neg.  Remote h/o Cervical Conization per patient in the 80's, pap normal thereafter.  Pap reflex today.  H/O Rt Breast invasive Ductal Ca grade 1 with ER positive.  Rt breast lumpectomy 09/2018 with Radioactive seed.  On Letrozole 2.5 mg daily currently, will stop this year. Mammo 11/19/22.  Lt breast Bx 11/27/22 Benign.  BMI 25.6.  Health labs with Fam MD.  Harriet Masson 07/2021.  BD Osteopenia 09/2021 Rt Femoral Neck T-Score -2.4.   Frax not increased. Repeat BD in 09/2023. Continue Vit D, Ca++ 1.5 g/d total and regular weight bearing physical activities. - Cytology - PAP( [facility])  2. History of conization of cervix - Cytology - PAP( [facility])  3. Postmenopause S/P LPS BSO/Peritoneal washings 10/27/2019.  Postmenopause on no HRT.  No PMB. No pelvic pain. Abstinent.    4. Osteopenia, unspecified location BD Osteopenia 09/2021 Rt Femoral Neck T-Score -2.4.  Frax not increased. Repeat BD in 09/2023. Continue Vit D, Ca++ 1.5 g/d total and regular weight bearing physical activities. - DG Bone Density; Future  5. Personal history of breast cancer (<5 yrs ago)  6. Personal history of other medical treatment  7. Other specified personal risk factors, not elsewhere classified   Princess Bruins MD, 11:25 AM

## 2023-02-03 DIAGNOSIS — G518 Other disorders of facial nerve: Secondary | ICD-10-CM | POA: Diagnosis not present

## 2023-02-03 DIAGNOSIS — G43019 Migraine without aura, intractable, without status migrainosus: Secondary | ICD-10-CM | POA: Diagnosis not present

## 2023-02-03 DIAGNOSIS — M542 Cervicalgia: Secondary | ICD-10-CM | POA: Diagnosis not present

## 2023-02-03 DIAGNOSIS — M791 Myalgia, unspecified site: Secondary | ICD-10-CM | POA: Diagnosis not present

## 2023-02-04 LAB — CYTOLOGY - PAP: Diagnosis: NEGATIVE

## 2023-02-12 ENCOUNTER — Telehealth: Payer: Self-pay | Admitting: Physical Medicine and Rehabilitation

## 2023-02-12 ENCOUNTER — Ambulatory Visit (INDEPENDENT_AMBULATORY_CARE_PROVIDER_SITE_OTHER): Payer: Medicare HMO | Admitting: Family Medicine

## 2023-02-12 VITALS — BP 122/70 | HR 77 | Temp 97.7°F | Ht 59.75 in | Wt 132.0 lb

## 2023-02-12 DIAGNOSIS — M546 Pain in thoracic spine: Secondary | ICD-10-CM

## 2023-02-12 DIAGNOSIS — G8929 Other chronic pain: Secondary | ICD-10-CM

## 2023-02-12 DIAGNOSIS — G43109 Migraine with aura, not intractable, without status migrainosus: Secondary | ICD-10-CM

## 2023-02-12 DIAGNOSIS — S46811A Strain of other muscles, fascia and tendons at shoulder and upper arm level, right arm, initial encounter: Secondary | ICD-10-CM

## 2023-02-12 NOTE — Patient Instructions (Signed)
Use the baclofen for rescue pain   TENS unit is great as tolerated Use heat on /off as well   I think you may have a rhomboid muscle spasm or strain  Try the rhomboid stretch   You can try some massage also   See the orthopedic on Monday as planned   You may end up needing PT

## 2023-02-12 NOTE — Assessment & Plan Note (Signed)
Still problematic and frequent Considering different neurologist to come up with preventative plan  Is in ER frequently  Has botox currently and takes atneolol

## 2023-02-12 NOTE — Progress Notes (Unsigned)
Subjective:    Patient ID: Jennifer [name], female    DOB: [birth year], 67 y.o.   MRN: [id number]  HPI Pt presents for shoulder/back pain injury   Wt Readings from Last 3 Encounters:  02/12/23 132 lb (59.9 kg)  02/02/23 130 lb (59 kg)  01/21/23 132 lb 2 oz (59.9 kg)   26.00 kg/m  Vitals:   02/12/23 1456  BP: 122/70  Pulse: 77  Temp: 97.7 F (36.5 C)  SpO2: 98%    H/o R trapezius muscle strain on r side after a fall in February Seen on 2/29 Recommended voltaren gel and TENS and stretching   R shoulder xray was in sept  IMPRESSION: Moderate degenerative joint disease of the right acromioclavicular and glenohumeral joints. No acute abnormality seen.  Nl cxr in feb   Still bothering her  Using lidocaine patches/ or roll on  TENS unit  Got better and then worse again  This is making migraines worse  Goes from shoulder blade on R up into the R neck  Some in her arm and hand  More dull than sharp Not hot  Not tingling   Then had to have CT of her cervical spine  R neurol foraminal narrowing C3-4 Mod to severe L neurol formianal C5-6      Wore TENS all day yesterday and it helped  This causes her headaches to worsen   Was told to try heat    Has appt with [facility]- with Np on Monday   Has some baclofen- helps a lot  Cannot take often due to rebound     Injured R ankle and hand as well Mild - improved  Patient Active Problem List   Diagnosis Date Noted   Right hand pain 01/21/2023   Right ankle pain 01/21/2023   Thoracic back pain 01/21/2023   Pain in right shoulder 08/12/2022   Hordeolum externum (stye) 08/04/2022   Raynaud's disease 03/17/2022   Left facial swelling 01/25/2022   Left ear pain 01/23/2022   Right leg pain 10/15/2021   Microcytic anemia 07/07/2021   De Quervain's tenosynovitis, right 04/18/2021   Iron deficiency anemia 09/08/2020   Common bile duct dilatation 09/06/2020   Opacity of lung on imaging study 09/06/2020   Dizziness  07/10/2020   History of breast cancer 03/01/2020   Genetic testing 11/14/2019   Bilateral hand pain 11/03/2019   Family history of prostate cancer    Adnexal cyst 09/26/2019   Family history of breast cancer    Malignant neoplasm of upper-inner quadrant of right breast in female, estrogen receptor positive ([place]) 09/20/2018   B12 deficiency 04/13/2018   Joint pain 04/11/2018   Colon cancer screening 09/16/2017   Prediabetes 07/28/2016   Allergic rhinitis 11/13/2014   Screening for lipoid disorders 09/26/2014   Preventative health care 09/26/2014   Dysuria 08/07/2014   Routine general medical examination at a health care facility 10/28/2012   GERD 09/16/2010   Hypothyroidism 05/07/2008   Osteopenia 05/07/2008   Migraine with aura 11/11/2007   Past Medical History:  Diagnosis Date   Allergic rhinitis    Allergy    seasonal   Anemia    Anxiety    Arthritis    hands, knees   Asthma    "as a child"   Blood transfusion without reported diagnosis    "as a child"   Breast cancer ([place]) 2019   Right Breast Cancer   Cancer ([place]) 09/2018   Breast CA new DX, right  breast   Cervical dysplasia    Common bile duct dilation    Diabetes mellitus without complication (HCC)    Endometriosis    Esophageal reflux    Family history of adverse reaction to anesthesia    younger sister anxiety for a dew days after   Family history of breast cancer    Family history of breast cancer    Family history of prostate cancer    Hepatic cyst 08/2020   multiple   Hepatic hemangioma 08/2020   intrahepatic hemangioma   Hiatal hernia    HSV-1 (herpes simplex virus 1) infection    Hypertension    pt.denies 10/19/22   Hypothyroid    Migraine with aura, without mention of intractable migraine without mention of status migrainosus    Osteopenia 08/2019   T score -2.2 FRAX 11% / 1.7%   Personal history of radiation therapy 2019   Right Breast Cancer   Pre-diabetes    Raynaud's disease    Past  Surgical History:  Procedure Laterality Date   BIOPSY  09/12/2020   Procedure: BIOPSY;  Surgeon: [name], MD;  Location: [facility];  Service: Endoscopy;;   BREAST BIOPSY Left 11/27/2022   MM LT BREAST BX W LOC DEV 1ST LESION IMAGE BX SPEC STEREO GUIDE 11/27/2022 [facility]   BREAST EXCISIONAL BIOPSY Bilateral over 10 years ago   benign x 2    BREAST LUMPECTOMY Right 10/10/2018   BREAST LUMPECTOMY WITH RADIOACTIVE SEED AND SENTINEL LYMPH NODE BIOPSY Right 10/10/2018   Procedure: RIGHT BREAST LUMPECTOMY WITH RADIOACTIVE SEED AND RIGHT SENTINEL LYMPH NODE BIOPSY;  Surgeon: [name], MD;  Location: [facility] [place];  Service: General;  Laterality: Right;   BREAST SURGERY     Benign breast lump excised   CERVICAL CONE BIOPSY  1985   severe dysplasia   COLONOSCOPY  2005 and dec 2019   normal   COLPOSCOPY     endoscopy  10/2018   ESOPHAGOGASTRODUODENOSCOPY (EGD) WITH PROPOFOL N/A 09/12/2020   Procedure: ESOPHAGOGASTRODUODENOSCOPY (EGD) WITH PROPOFOL;  Surgeon: [name], MD;  Location: [facility];  Service: Endoscopy;  Laterality: N/A;   EUS N/A 09/12/2020   Procedure: UPPER ENDOSCOPIC ULTRASOUND (EUS) RADIAL;  Surgeon: [name], MD;  Location: [facility];  Service: Endoscopy;  Laterality: N/A;   LAPAROSCOPIC ASSISTED VAGINAL HYSTERECTOMY     LAPAROSCOPIC BILATERAL SALPINGO OOPHERECTOMY Bilateral 10/27/2019   Procedure: LAPAROSCOPIC BILATERAL SALPINGO OOPHORECTOMY WITH PERITONEAL WASHINGS;  Surgeon: Princess Bruins, MD;  Location: [facility];  Service: Gynecology;  Laterality: Bilateral;  request 1:00pm on Friday, Dec. 4th in [place] time held requests one hour OR time   moles removed from upper back, face lft, 1 between breasts, upper leg left inside  10/18/2019   wearing small round bandaids on for 2 weeks   ROTATOR CUFF REPAIR Bilateral 08/2008   UPPER GASTROINTESTINAL ENDOSCOPY     Social History    Tobacco Use   Smoking status: Never    Passive exposure: Never   Smokeless tobacco: Never  Vaping Use   Vaping Use: Never used  Substance Use Topics   Alcohol use: No    Alcohol/week: 0.0 standard drinks of alcohol   Drug use: No   Family History  Problem Relation Age of Onset   Breast cancer Mother 76   Diabetes Mother    Irritable bowel syndrome Mother    Hypertension Father    Heart disease Father    Hypertension  Sister    Anuerysm Sister        head   Irritable bowel syndrome Sister    Hypertension Sister    Anxiety disorder Sister    Other Sister        prediabetes   Celiac disease Brother    Osteoporosis Maternal Grandmother    Diabetes Maternal Grandmother    COPD Paternal Grandmother    Heart disease Paternal Grandfather    Breast cancer Cousin 28       pat first cousin   Breast cancer Cousin        mat second cousin with breast cancer in her 41s   Thyroid cancer Cousin 17       pat first cousin   Prostate cancer Other 43   Colon cancer Neg Hx    Stomach cancer Neg Hx    Esophageal cancer Neg Hx    Pancreatic cancer Neg Hx    Rectal cancer Neg Hx    Colon polyps Neg Hx    Crohn's disease Neg Hx    Ulcerative colitis Neg Hx    Allergies  Allergen Reactions   Ciprofloxacin Other (See Comments)    Dizziness    Codeine Phosphate Other (See Comments)    Dizziness, heart palpitations, nervous   Decongestant [Pseudoephedrine] Other (See Comments)    dizziness   Diclofenac    Flonase [Fluticasone Propionate] Other (See Comments)    Worsens her migraine    Hydrocodone Other (See Comments)    Made nervous--10/09 surgery rotator cuff   Macrobid [Nitrofurantoin]     headaches   Nasonex [Mometasone]     Migraines   Reglan [Metoclopramide] Other (See Comments)    Dizziness   Topamax [Topiramate] Other (See Comments)    Dizziness    Benadryl [Diphenhydramine Hcl] Palpitations   Sulfa Antibiotics Anxiety   Current Outpatient Medications on File  Prior to Visit  Medication Sig Dispense Refill   ALPRAZolam (XANAX) 0.5 MG tablet TAKE 1/2 TO 1 TABLET BY MOUTH ONCE DAILY AS NEEDED FOR SLEEP/ANXIETY 15 tablet 2   atenolol (TENORMIN) 25 MG tablet Take 12.5 mg by mouth 3 (three) times daily. For migraine prevention     B Complex Vitamins (B COMPLEX PO) Take by mouth.     baclofen (LIORESAL) 10 MG tablet Take by mouth as needed.     CALCIUM CARBONATE-VITAMIN D PO Take 1 tablet by mouth daily. 1000 mg     famotidine (PEPCID) 40 MG tablet Take 40 mg by mouth 2 (two) times daily.      hydrocortisone 2.5 % cream Apply to affected areas BID PRN as directed. 30 g 3   hydrOXYzine (ATARAX) 25 MG tablet Take 37.5 mg by mouth at bedtime as needed.     ketorolac (TORADOL) 10 MG tablet Take 1 tablet (10 mg total) by mouth every 6 (six) hours as needed for moderate pain. 12 tablet 0   letrozole (FEMARA) 2.5 MG tablet TAKE 1 TABLET BY MOUTH EVERY DAY 90 tablet 3   levocetirizine (XYZAL) 5 MG tablet Take 5 mg by mouth at bedtime.   3   levothyroxine (SYNTHROID) 75 MCG tablet TAKE ONE TABLET BY MOUTH ONE TIME DAILY BEFORE BREAKFAST 90 tablet 1   Magnesium Oxide 400 (240 Mg) MG TABS Take 400 mg by mouth daily.      metFORMIN (GLUCOPHAGE-XR) 500 MG 24 hr tablet TAKE 1 TABLET BY MOUTH EVERY DAY WITH BREAKFAST 90 tablet 1   Multiple Vitamin (MULTIVITAMIN WITH MINERALS) TABS  tablet Take 1 tablet by mouth daily.     nabumetone (RELAFEN) 500 MG tablet Take 500 mg by mouth 2 (two) times daily.     neomycin-polymyxin b-dexamethasone (MAXITROL) 3.5-10000-0.1 SUSP 1 drop 3 (three) times daily.     Omega-3 Fatty Acids (FISH OIL) 1000 MG CAPS Take 1,000 mg by mouth daily.      OnabotulinumtoxinA (BOTOX IJ) Inject 1 Dose as directed every 6 (six) weeks.      ondansetron (ZOFRAN ODT) 4 MG disintegrating tablet Take 1 tablet (4 mg total) by mouth every 8 (eight) hours as needed. 20 tablet 0   pantoprazole (PROTONIX) 20 MG tablet Take 1 tablet (20 mg total) by mouth daily. 30  tablet 5   predniSONE (DELTASONE) 10 MG tablet Take 2 pills once daily for 5 days 10 tablet 0   PRESCRIPTION MEDICATION Inject into the skin every 6 (six) weeks. Steroid Trigger     Probiotic Product (PROBIOTIC-10 PO) Take 1 capsule by mouth daily.      promethazine (PHENERGAN) 25 MG tablet Take 25 mg by mouth daily as needed.     traMADol (ULTRAM) 50 MG tablet Take 0.5 tablets (25 mg total) by mouth every 8 (eight) hours as needed (cough). 10 tablet 0   triamcinolone (KENALOG) 0.1 % Apply to affected areas BID PRN for up to two weeks at a time as directed. 45 g 3   TURMERIC PO Take 1 tablet by mouth daily.     rizatriptan (MAXALT) 10 MG tablet Take 1 tablet (10 mg total) by mouth once as needed for up to 10 days for migraine. May repeat in 2 hours if needed 10 tablet 0   No current facility-administered medications on file prior to visit.     Review of Systems  Constitutional:  Positive for fatigue.  Respiratory:  Negative for chest tightness and shortness of breath.   Cardiovascular:  Negative for chest pain and palpitations.  Gastrointestinal:  Negative for abdominal pain.  Genitourinary:        No urinary symptoms   Musculoskeletal:  Positive for back pain. Negative for gait problem and joint swelling.  Neurological:  Positive for headaches. Negative for dizziness, syncope, speech difficulty, weakness and numbness.       Objective:   Physical Exam Constitutional:      General: She is not in acute distress.    Appearance: Normal appearance. She is well-developed and normal weight. She is not ill-appearing or diaphoretic.  HENT:     Head: Normocephalic and atraumatic.     Mouth/Throat:     Mouth: Mucous membranes are moist.  Eyes:     General: No scleral icterus.    Conjunctiva/sclera: Conjunctivae normal.     Pupils: Pupils are equal, round, and reactive to light.  Cardiovascular:     Rate and Rhythm: Normal rate and regular rhythm.  Pulmonary:     Effort: Pulmonary effort  is normal.     Breath sounds: Normal breath sounds. No wheezing or rales.  Abdominal:     General: Bowel sounds are normal. There is no distension.     Palpations: Abdomen is soft.     Tenderness: There is no abdominal tenderness.  Musculoskeletal:        General: Tenderness present.     Cervical back: Normal range of motion and neck supple.     Thoracic back: Spasms and tenderness present. No swelling, deformity or signs of trauma. Normal range of motion. No scoliosis.  Lumbar back: No edema or bony tenderness.     Right lower leg: No edema.     Left lower leg: No edema.     Comments: Pain in R thoracic area between scapula and spine with spasm/tightness No skin change Nl rom of shoulder  Some pain with hawking and neer tests  A rhomboid stretch targets the area  Mild acromion tenderness  Less trapezius tenderness today  Nl rom of CS    Lymphadenopathy:     Cervical: No cervical adenopathy.  Skin:    General: Skin is warm and dry.     Coloration: Skin is not pale.     Findings: No erythema or rash.  Neurological:     Mental Status: She is alert.     Cranial Nerves: No cranial nerve deficit.     Sensory: No sensory deficit.     Motor: No weakness, atrophy or abnormal muscle tone.     Coordination: Coordination normal.     Gait: Gait normal.     Deep Tendon Reflexes: Reflexes are normal and symmetric. Reflexes normal.     Comments: Nl grip bilaterally  Psychiatric:     Comments: Seems stressed           Assessment & Plan:   Problem List Items Addressed This Visit       Cardiovascular and Mediastinum   Migraine with aura    Still problematic and frequent Considering different neurologist to come up with preventative plan  Is in ER frequently  Has botox currently and takes atneolol          Other   Thoracic back pain - Primary    Pt continues to have r scapular area pain  This has become acute on chronic however  It refers to shoulder and arm  somewhat/ has had shoulder issues in the past  Since she was seen -did go to ER in [facility] Reviewed their info and imaging  Notes some cervical DD with some foraminal narrowing  Shoulder film and Ct head ok   Today's pain is reproduced and improved with a Rhomboid stretch on the R  She has full int/ext rot of R shoulder but some pain with hawking and neer tests Puzzling symptoms  Agree with plan to see ortho on Monday  Continue heat/TENS Baclofen if it does not cause rebound headaches  Massage if tolerable   May need PT in future  ER precautions noted

## 2023-02-12 NOTE — Assessment & Plan Note (Signed)
Pt continues to have r scapular area pain  This has become acute on chronic however  It refers to shoulder and arm somewhat/ has had shoulder issues in the past  Since she was seen -did go to ER in [facility] Reviewed their info and imaging  Notes some cervical DD with some foraminal narrowing  Shoulder film and Ct head ok   Today's pain is reproduced and improved with a Rhomboid stretch on the R  She has full int/ext rot of R shoulder but some pain with hawking and neer tests Puzzling symptoms  Agree with plan to see ortho on Monday  Continue heat/TENS Baclofen if it does not cause rebound headaches  Massage if tolerable   May need PT in future  ER precautions noted

## 2023-02-13 ENCOUNTER — Other Ambulatory Visit: Payer: Self-pay

## 2023-02-13 ENCOUNTER — Emergency Department
Admission: EM | Admit: 2023-02-13 | Discharge: 2023-02-13 | Disposition: A | Discharge status: Home / Self Care | Payer: Medicare HMO | Attending: Emergency Medicine | Admitting: Emergency Medicine

## 2023-02-13 DIAGNOSIS — Z853 Personal history of malignant neoplasm of breast: Secondary | ICD-10-CM | POA: Diagnosis not present

## 2023-02-13 DIAGNOSIS — G43809 Other migraine, not intractable, without status migrainosus: Secondary | ICD-10-CM | POA: Diagnosis not present

## 2023-02-13 DIAGNOSIS — Z7984 Long term (current) use of oral hypoglycemic drugs: Secondary | ICD-10-CM | POA: Insufficient documentation

## 2023-02-13 DIAGNOSIS — E119 Type 2 diabetes mellitus without complications: Secondary | ICD-10-CM | POA: Insufficient documentation

## 2023-02-13 DIAGNOSIS — Z79899 Other long term (current) drug therapy: Secondary | ICD-10-CM | POA: Diagnosis not present

## 2023-02-13 DIAGNOSIS — R519 Headache, unspecified: Secondary | ICD-10-CM | POA: Diagnosis present

## 2023-02-13 DIAGNOSIS — I10 Essential (primary) hypertension: Secondary | ICD-10-CM | POA: Diagnosis not present

## 2023-02-13 DIAGNOSIS — G43909 Migraine, unspecified, not intractable, without status migrainosus: Secondary | ICD-10-CM | POA: Diagnosis not present

## 2023-02-13 DIAGNOSIS — J45909 Unspecified asthma, uncomplicated: Secondary | ICD-10-CM | POA: Diagnosis not present

## 2023-02-13 MED ORDER — ONDANSETRON HCL 4 MG/2ML IJ SOLN
4.0000 mg | Freq: Once | INTRAMUSCULAR | Status: AC
  Administered 2023-02-13: 4 mg via INTRAVENOUS
  Filled 2023-02-13: qty 2

## 2023-02-13 MED ORDER — SODIUM CHLORIDE 0.9 % IV BOLUS
1000.0000 mL | Freq: Once | INTRAVENOUS | Status: AC
  Administered 2023-02-13: 1000 mL via INTRAVENOUS

## 2023-02-13 MED ORDER — DEXAMETHASONE SODIUM PHOSPHATE 10 MG/ML IJ SOLN
10.0000 mg | Freq: Once | INTRAMUSCULAR | Status: AC
  Administered 2023-02-13: 10 mg via INTRAVENOUS
  Filled 2023-02-13: qty 1

## 2023-02-13 MED ORDER — KETOROLAC TROMETHAMINE 30 MG/ML IJ SOLN
15.0000 mg | Freq: Once | INTRAMUSCULAR | Status: AC
  Administered 2023-02-13: 15 mg via INTRAVENOUS
  Filled 2023-02-13: qty 1

## 2023-02-13 MED ORDER — SODIUM CHLORIDE 0.9 % IV BOLUS: 1000.0000 mL | Freq: Once | INTRAVENOUS | Status: DC

## 2023-02-13 NOTE — ED Notes (Signed)
Report given to Rachel RN

## 2023-02-13 NOTE — ED Triage Notes (Signed)
Pt been having a migraine for the past 5 days, has been taking her prescribed meds for it but not getting any better.

## 2023-02-13 NOTE — ED Provider Notes (Signed)
[facility] Emergency Department Provider Note   ____________________________________________   Event Date/Time   First MD Initiated Contact with Patient 02/13/23 1708     (approximate)  I have reviewed the triage vital signs and the nursing notes.   HISTORY  Chief Complaint Migraine    HPI Jennifer [name] is a 67 y.o. female presents to the emergency room with complaint of migraine headache for the last 5 days.  Patient has known history of migraines.  Patient reports that she fell in the earlier part of March injuring her right scapula/shoulder region and since that time she has had more persistent migraines.  She has seen her primary care/and is seeing orthopedic doctor next week for this injury.  She feels like the tension from the injury is causing her migraines to trigger more.  Patient reports that daily for the past 5 days she has been having to take her triptan's.  She reports that she does have some light sensitivity but not sound sensitivity.  She does have some associated nausea but no vomiting.  She reports that currently her headache is a 2 out of 10 and she can feel the aura returning prior to the migraine starting back.  Patient reports that her normal treatment for these migraines in the emergency room is Decadron, Toradol, Zofran and IV fluids.   Past Medical History:  Diagnosis Date   Allergic rhinitis    Allergy    seasonal   Anemia    Anxiety    Arthritis    hands, knees   Asthma    "as a child"   Blood transfusion without reported diagnosis    "as a child"   Breast cancer ([place]) 2019   Right Breast Cancer   Cancer ([place]) 09/2018   Breast CA new DX, right breast   Cervical dysplasia    Common bile duct dilation    Diabetes mellitus without complication ([place])    Endometriosis    Esophageal reflux    Family history of adverse reaction to anesthesia    younger sister anxiety for a dew days after   Family history of breast cancer     Family history of breast cancer    Family history of prostate cancer    Hepatic cyst 08/2020   multiple   Hepatic hemangioma 08/2020   intrahepatic hemangioma   Hiatal hernia    HSV-1 (herpes simplex virus 1) infection    Hypertension    pt.denies 10/19/22   Hypothyroid    Migraine with aura, without mention of intractable migraine without mention of status migrainosus    Osteopenia 08/2019   T score -2.2 FRAX 11% / 1.7%   Personal history of radiation therapy 2019   Right Breast Cancer   Pre-diabetes    Raynaud's disease     Patient Active Problem List   Diagnosis Date Noted   Right hand pain 01/21/2023   Right ankle pain 01/21/2023   Trapezius muscle strain 01/21/2023   Pain in right shoulder 08/12/2022   Hordeolum externum (stye) 08/04/2022   Raynaud's disease 03/17/2022   Left facial swelling 01/25/2022   Left ear pain 01/23/2022   Right leg pain 10/15/2021   Microcytic anemia 07/07/2021   De Quervain's tenosynovitis, right 04/18/2021   Iron deficiency anemia 09/08/2020   Common bile duct dilatation 09/06/2020   Opacity of lung on imaging study 09/06/2020   Dizziness 07/10/2020   History of breast cancer 03/01/2020   Genetic testing 11/14/2019  Bilateral hand pain 11/03/2019   Family history of prostate cancer    Adnexal cyst 09/26/2019   Family history of breast cancer    Malignant neoplasm of upper-inner quadrant of right breast in female, estrogen receptor positive ([place]) 09/20/2018   B12 deficiency 04/13/2018   Joint pain 04/11/2018   Colon cancer screening 09/16/2017   Prediabetes 07/28/2016   Allergic rhinitis 11/13/2014   Screening for lipoid disorders 09/26/2014   Preventative health care 09/26/2014   Dysuria 08/07/2014   Routine general medical examination at a health care facility 10/28/2012   GERD 09/16/2010   Hypothyroidism 05/07/2008   Osteopenia 05/07/2008   Migraine with aura 11/11/2007    Past Surgical History:  Procedure Laterality Date    BIOPSY  09/12/2020   Procedure: BIOPSY;  Surgeon: [name], MD;  Location: [facility];  Service: Endoscopy;;   BREAST BIOPSY Left 11/27/2022   MM LT BREAST BX W LOC DEV 1ST LESION IMAGE BX SPEC STEREO GUIDE 11/27/2022 [facility]   BREAST EXCISIONAL BIOPSY Bilateral over 10 years ago   benign x 2    BREAST LUMPECTOMY Right 10/10/2018   BREAST LUMPECTOMY WITH RADIOACTIVE SEED AND SENTINEL LYMPH NODE BIOPSY Right 10/10/2018   Procedure: RIGHT BREAST LUMPECTOMY WITH RADIOACTIVE SEED AND RIGHT SENTINEL LYMPH NODE BIOPSY;  Surgeon: [name], MD;  Location: [facility];  Service: General;  Laterality: Right;   BREAST SURGERY     Benign breast lump excised   CERVICAL CONE BIOPSY  1985   severe dysplasia   COLONOSCOPY  2005 and dec 2019   normal   COLPOSCOPY     endoscopy  10/2018   ESOPHAGOGASTRODUODENOSCOPY (EGD) WITH PROPOFOL N/A 09/12/2020   Procedure: ESOPHAGOGASTRODUODENOSCOPY (EGD) WITH PROPOFOL;  Surgeon: [name], MD;  Location: [facility];  Service: Endoscopy;  Laterality: N/A;   EUS N/A 09/12/2020   Procedure: UPPER ENDOSCOPIC ULTRASOUND (EUS) RADIAL;  Surgeon: [name], MD;  Location: [facility];  Service: Endoscopy;  Laterality: N/A;   LAPAROSCOPIC ASSISTED VAGINAL HYSTERECTOMY     LAPAROSCOPIC BILATERAL SALPINGO OOPHERECTOMY Bilateral 10/27/2019   Procedure: LAPAROSCOPIC BILATERAL SALPINGO OOPHORECTOMY WITH PERITONEAL WASHINGS;  Surgeon: Princess Bruins, MD;  Location: [facility];  Service: Gynecology;  Laterality: Bilateral;  request 1:00pm on Friday, Dec. 4th in [place] time held requests one hour OR time   moles removed from upper back, face lft, 1 between breasts, upper leg left inside  10/18/2019   wearing small round bandaids on for 2 weeks   ROTATOR CUFF REPAIR Bilateral 08/2008   UPPER GASTROINTESTINAL ENDOSCOPY      Prior to Admission medications   Medication Sig Start Date End  Date Taking? Authorizing Provider  ALPRAZolam (XANAX) 0.5 MG tablet TAKE 1/2 TO 1 TABLET BY MOUTH ONCE DAILY AS NEEDED FOR SLEEP/ANXIETY 10/22/22   Tower, Marne A, MD  atenolol (TENORMIN) 25 MG tablet Take 12.5 mg by mouth 3 (three) times daily. For migraine prevention    [provider]  B Complex Vitamins (B COMPLEX PO) Take by mouth.    [provider]  baclofen (LIORESAL) 10 MG tablet Take by mouth as needed. 11/18/21   [provider]  CALCIUM CARBONATE-VITAMIN D PO Take 1 tablet by mouth daily. 1000 mg    [provider]  famotidine (PEPCID) 40 MG tablet Take 40 mg by mouth 2 (two) times daily.     [provider]  hydrocortisone 2.5 % cream Apply to affected areas  BID PRN as directed. 10/23/20   Moye, [place], MD  hydrOXYzine (ATARAX) 25 MG tablet Take 37.5 mg by mouth at bedtime as needed.    [provider]  ketorolac (TORADOL) 10 MG tablet Take 1 tablet (10 mg total) by mouth every 6 (six) hours as needed for moderate pain. 09/07/20   Carrie Mew, MD  letrozole [facility]) 2.5 MG tablet TAKE 1 TABLET BY MOUTH EVERY DAY 09/02/22   Nicholas Lose, MD  levocetirizine (XYZAL) 5 MG tablet Take 5 mg by mouth at bedtime.  10/28/17   [provider]  levothyroxine (SYNTHROID) 75 MCG tablet TAKE ONE TABLET BY MOUTH ONE TIME DAILY BEFORE BREAKFAST 09/04/22   Tower, [name], MD  Magnesium Oxide 400 (240 Mg) MG TABS Take 400 mg by mouth daily.     [provider]  metFORMIN (GLUCOPHAGE-XR) 500 MG 24 hr tablet TAKE 1 TABLET BY MOUTH EVERY DAY WITH BREAKFAST 12/08/22   Tower, [name], MD  Multiple Vitamin (MULTIVITAMIN WITH MINERALS) TABS tablet Take 1 tablet by mouth daily.    [provider]  nabumetone (RELAFEN) 500 MG tablet Take 500 mg by mouth 2 (two) times daily. 09/03/22   [provider]  neomycin-polymyxin b-dexamethasone (MAXITROL) 3.5-10000-0.1 SUSP 1 drop 3 (three) times daily. 08/27/22   [provider]  Omega-3 Fatty Acids (FISH OIL) 1000 MG CAPS Take 1,000 mg by mouth daily.     [provider]  OnabotulinumtoxinA (BOTOX IJ) Inject 1 Dose as directed every 6 (six) weeks.     [provider]  ondansetron (ZOFRAN ODT) 4 MG disintegrating tablet Take 1 tablet (4 mg total) by mouth every 8 (eight) hours as needed. 10/08/20   Rudene Re, MD  pantoprazole (PROTONIX) 20 MG tablet Take 1 tablet (20 mg total) by mouth daily. 01/12/23   Zehr, Laban Emperor, PA-C  predniSONE (DELTASONE) 10 MG tablet Take 2 pills once daily for 5 days 12/29/22   Tower, [name], MD  PRESCRIPTION MEDICATION Inject into the skin every 6 (six) weeks. Steroid Trigger    [provider]  Probiotic Product (PROBIOTIC-10 PO) Take 1 capsule by mouth daily.     [provider]  promethazine (PHENERGAN) 25 MG tablet Take 25 mg by mouth daily as needed. 10/12/22   [provider]  rizatriptan (MAXALT) 10 MG tablet Take 1 tablet (10 mg total) by mouth once as needed for up to 10 days for migraine. May repeat in 2 hours if needed 10/15/21 02/02/23  Tower, [name], MD  traMADol (ULTRAM) 50 MG tablet Take 0.5 tablets (25 mg total) by mouth every 8 (eight) hours as needed (cough). 12/23/22   Tower, [name], MD  triamcinolone (KENALOG) 0.1 % Apply to affected areas BID PRN for up to two weeks at a time as directed. 10/23/20   Moye, [place], MD  TURMERIC PO Take 1 tablet by mouth daily.    [provider]    Allergies Ciprofloxacin, Codeine phosphate, Decongestant [pseudoephedrine], Diclofenac, Flonase [fluticasone propionate], Hydrocodone, Macrobid [nitrofurantoin], Nasonex [mometasone], Reglan [metoclopramide], Topamax [topiramate], Benadryl [diphenhydramine hcl], and Sulfa antibiotics  Family History  Problem Relation Age of Onset   Breast cancer Mother 79   Diabetes Mother    Irritable bowel syndrome Mother    Hypertension Father    Heart disease Father     Hypertension Sister    Anuerysm Sister        head   Irritable bowel syndrome Sister    Hypertension Sister  Anxiety disorder Sister    Other Sister        prediabetes   Celiac disease Brother    Osteoporosis Maternal Grandmother    Diabetes Maternal Grandmother    COPD Paternal Grandmother    Heart disease Paternal Grandfather    Breast cancer Cousin 53       pat first cousin   Breast cancer Cousin        mat second cousin with breast cancer in her 42s   Thyroid cancer Cousin 41       pat first cousin   Prostate cancer Other 8   Colon cancer Neg Hx    Stomach cancer Neg Hx    Esophageal cancer Neg Hx    Pancreatic cancer Neg Hx    Rectal cancer Neg Hx    Colon polyps Neg Hx    Crohn's disease Neg Hx    Ulcerative colitis Neg Hx     Social History Social History   Tobacco Use   Smoking status: Never    Passive exposure: Never   Smokeless tobacco: Never  Vaping Use   Vaping Use: Never used  Substance Use Topics   Alcohol use: No    Alcohol/week: 0.0 standard drinks of alcohol   Drug use: No    Review of Systems  Constitutional: No fever/chills Eyes: No visual changes. ENT: No sore throat. Cardiovascular: Denies chest pain. Respiratory: Denies shortness of breath. Gastrointestinal: No abdominal pain.  No nausea, no vomiting.  No diarrhea.  No constipation. Genitourinary: Negative for dysuria. Musculoskeletal: Negative for back pain. Skin: Negative for rash. Neurological: Positive for headache, negative focal weakness and numbness.   ____________________________________________   PHYSICAL EXAM:  VITAL SIGNS: ED Triage Vitals [02/13/23 1658]  Enc Vitals Group     BP 129/61     Pulse Rate 76     Resp 18     Temp 97.9 F (36.6 C)     Temp src      SpO2 97 %     Weight      Height      Head Circumference      Peak Flow      Pain Score 2     Pain Loc      Pain Edu?      Excl. in [place]?     Constitutional: Alert and oriented. Well appearing  and in no acute distress. Eyes: Conjunctivae are normal. PERRL. EOMI. Head: Atraumatic. Nose: No congestion/rhinnorhea. Mouth/Throat: Mucous membranes are moist.  Oropharynx non-erythematous. Neck: No stridor.   Cardiovascular: Normal rate, regular rhythm. Grossly normal heart sounds.  Good peripheral circulation. Respiratory: Normal respiratory effort.  No retractions. Lungs CTAB. Gastrointestinal: Soft and nontender. No distention. No abdominal bruits. No CVA tenderness. Musculoskeletal: No lower extremity tenderness nor edema.  No joint effusions. Neurologic:  Normal speech and language. No gross focal neurologic deficits are appreciated. No gait instability. Skin:  Skin is warm, dry and intact. No rash noted. Psychiatric: Mood and affect are normal. Speech and behavior are normal.  ____________________________________________   LABS (all labs ordered are listed, but only abnormal results are displayed)  Labs Reviewed - No data to display ____________________________________________  EKG   ____________________________________________  RADIOLOGY  ED MD interpretation:    Official radiology report(s): No results found.  ____________________________________________   PROCEDURES  Procedure(s) performed: None  Procedures  Critical Care performed: No  ____________________________________________   INITIAL IMPRESSION / ASSESSMENT AND PLAN / ED COURSE     Jennifer  APHRODITE [name] is a 67 y.o. female presents to the emergency room with complaint of migraine headache for the last 5 days.  Patient has known history of migraines.  Patient reports that she fell in the earlier part of March injuring her right scapula/shoulder region and since that time she has had more persistent migraines.  She has seen her primary care/and is seeing orthopedic doctor next week for this injury.  She feels like the tension from the injury is causing her migraines to trigger more.  Patient reports that  daily for the past 5 days she has been having to take her triptan's.  She reports that she does have some light sensitivity but not sound sensitivity.  She does have some associated nausea but no vomiting.  She reports that currently her headache is a 2 out of 10 and she can feel the aura returning prior to the migraine starting back.  Patient reports that her normal treatment for these migraines in the emergency room is Decadron, Toradol, Zofran and IV fluids  Will order patient IV fluid bolus normal saline, Decadron, Toradol, Zofran and reevaluate.  After patient received medication and fluids, she states that she is feeling much better and her headache has been relieved.  She is ready to be discharged home. Patient will now be discharged home in stable condition.      ____________________________________________   FINAL CLINICAL IMPRESSION(S) / ED DIAGNOSES  Final diagnoses:  Migraine without status migrainosus, not intractable, unspecified migraine type     ED Discharge Orders     None        Note:  This document was prepared using Dragon voice recognition software and may include unintentional dictation errors.     Willaim Rayas, NP 02/13/23 Jeananne Rama    Lavonia Drafts, MD 02/13/23 [phone]

## 2023-02-13 NOTE — Discharge Instructions (Signed)
Been seen today in the emergency room and treated for migraine headache.  I recommend that she follow-up with your primary care provider if symptoms persist or worsen.

## 2023-02-13 NOTE — ED Notes (Signed)
Patient self-removed her IV

## 2023-02-15 ENCOUNTER — Ambulatory Visit: Payer: Medicare HMO | Admitting: Physical Medicine and Rehabilitation

## 2023-02-15 ENCOUNTER — Ambulatory Visit: Payer: Medicare HMO | Admitting: Family Medicine

## 2023-02-15 ENCOUNTER — Encounter: Payer: Self-pay | Admitting: Physical Medicine and Rehabilitation

## 2023-02-15 DIAGNOSIS — T148XXA Other injury of unspecified body region, initial encounter: Secondary | ICD-10-CM

## 2023-02-15 DIAGNOSIS — M79601 Pain in right arm: Secondary | ICD-10-CM

## 2023-02-15 DIAGNOSIS — M7918 Myalgia, other site: Secondary | ICD-10-CM | POA: Diagnosis not present

## 2023-02-15 DIAGNOSIS — M549 Dorsalgia, unspecified: Secondary | ICD-10-CM | POA: Diagnosis not present

## 2023-02-15 DIAGNOSIS — M25511 Pain in right shoulder: Secondary | ICD-10-CM | POA: Diagnosis not present

## 2023-02-15 DIAGNOSIS — G8929 Other chronic pain: Secondary | ICD-10-CM | POA: Diagnosis not present

## 2023-02-15 NOTE — Progress Notes (Unsigned)
Jennifer [name] - 67 y.o. female MRN [id number]  Date of birth: Jul 30, 1956  Office Visit Note: Visit Date: 02/15/2023 PCP: Abner Greenspan, MD Referred by: Tower, [name], MD  Subjective: Chief Complaint  Patient presents with   Neck - Pain   Middle Back - Pain   HPI: Jennifer [name] is a 67 y.o. female who comes in today as a self referral for evaluation of chronic, worsening and severe right scapular pain radiating to shoulder and down right arm, does intermittently radiate to head. Severe pain to localized to right trapezius region. Patient previously treated by Dr Joni Fears from an orthopedic standpoint. Pain ongoing for several months, worsened after fall on 01/19/2023, states she fell in the bathroom while painting toenails. She describes pain as sore, aching and dull sensation, currently rates as 2 out of 10. Some relief of pain with home exercise regimen, rest, TENS unit, topical pain creams and medications. States her pain becomes so bad that this triggers migraine headache. She has been treated in the emergency department many times over the last several months for chronic headaches. Recent CT of cervical spine from [facility] exhibits multi level spondylosis, moderate right sided foraminal narrowing on the right at C3-C4, severe left foraminal narrowing at C5-C6. No high grade spinal canal stenosis noted. She is currently being treated by Dr. Orie Rout at [place], she is on combination of medications to help manage headaches. Reports good relief of myofascial trigger point injections by Dr. Domingo Cocking, states she undergoes this procedure every couple of months as needed. Patient denies focal weakness, numbness and tingling. Of note, she does have multiple allergies/intolerances to medications.     Review of Systems  Musculoskeletal:  Positive for back pain and myalgias.  Neurological:  Negative for tingling, sensory change, focal weakness and weakness.  All  other systems reviewed and are negative.  Otherwise per HPI.  Assessment & Plan: Visit Diagnoses:    ICD-10-CM   1. Muscle strain  T14.8XXA Small Joint Inj    2. Chronic upper back pain  M54.9 Ambulatory referral to Physical Therapy   G89.29 Small Joint Inj    3. Chronic right shoulder pain  M25.511 Ambulatory referral to Physical Therapy   G89.29 Small Joint Inj    4. Pain of right upper extremity  M79.601 Ambulatory referral to Physical Therapy    Small Joint Inj    5. Myofascial pain syndrome  M79.18 Ambulatory referral to Physical Therapy    Small Joint Inj       Plan: Findings:  Chronic, worsening and severe right scapular pain radiating to shoulder and down right arm, does intermittently radiate to head.  Patient continues to have severe pain despite good conservative therapy such as home exercise regimen, TENS unit, topical pain creams and use of medications.  Some relief of pain with myofascial trigger point injections done by her neurologist Dr. Domingo Cocking.  Patient's clinical presentation and exam are consistent with myofascial pain syndrome, she is significantly tender upon palpation of the right scapular/trapezius region.  Multiple palpable trigger points noted to right trapezius and rhomboid regions upon exam today.  Next step is to place order for formal physical therapy with our in-house team, I do think patient would benefit from manual treatments and dry needling.  I did discuss medication management with patient today in detail.  She received intramuscular Toradol injection in the office today.  I also spoke with her about continuing with Baclofen as prescribed.  I would like to see patient back in 6 weeks for reevaluation.  No red flag symptoms noted upon exam today.    Meds & Orders: No orders of the defined types were placed in this encounter.   Orders Placed This Encounter  Procedures   Small Joint Inj   Ambulatory referral to Physical Therapy    Follow-up: Return  for 6 week follow up for re-evaluation.   Procedures: Small Joint Inj on 02/15/2023 1:47 PM Indications: pain Details: 25 G needle, posterior approach Medications: 30 mg ketorolac 30 MG/ML Outcome: tolerated well, no immediate complications  Intramuscular Toradol injection to left ventrogluteal region, she tolerated without difficulty. Consent was given by the patient. Immediately prior to procedure a time out was called to verify the correct patient, procedure, equipment, support staff and site/side marked as required. Patient was prepped and draped in the usual sterile fashion.          Clinical History: No specialty comments available.   She reports that she has never smoked. She has never been exposed to tobacco smoke. She has never used smokeless tobacco.  Recent Labs    08/25/22 1025  HGBA1C 6.0    Objective:  VS:  HT:    WT:   BMI:     BP:   HR: bpm  TEMP: ( )  RESP:  Physical Exam Vitals and nursing note reviewed.  HENT:     Head: Normocephalic and atraumatic.     Right Ear: External ear normal.     Left Ear: External ear normal.     Nose: Nose normal.     Mouth/Throat:     Mouth: Mucous membranes are moist.  Eyes:     Extraocular Movements: Extraocular movements intact.  Cardiovascular:     Rate and Rhythm: Normal rate.     Pulses: Normal pulses.  Pulmonary:     Effort: Pulmonary effort is normal.  Abdominal:     General: Abdomen is flat. There is no distension.  Musculoskeletal:        General: Tenderness present.     Cervical back: Tenderness present.     Comments: No discomfort noted with flexion, extension and side-to-side rotation. Patient has good strength in the upper extremities including 5 out of 5 strength in wrist extension, long finger flexion and APB.  There is no atrophy of the hands intrinsically.  Sensation intact bilaterally.  Multiple palpable trigger points noted to right trapezius/rhomboid regions upon palpation.  Negative Hoffman's  sign. Negative Spurling's sign.    Skin:    General: Skin is warm and dry.     Capillary Refill: Capillary refill takes less than 2 seconds.  Neurological:     General: No focal deficit present.     Mental Status: She is alert and oriented to person, place, and time.  Psychiatric:        Mood and Affect: Mood normal.        Behavior: Behavior normal.     Ortho Exam  Imaging: No results found.  Past Medical/Family/Surgical/Social History: Medications & Allergies reviewed per EMR, new medications updated. Patient Active Problem List   Diagnosis Date Noted   Right hand pain 01/21/2023   Right ankle pain 01/21/2023   Thoracic back pain 01/21/2023   Pain in right shoulder 08/12/2022   Hordeolum externum (stye) 08/04/2022   Raynaud's disease 03/17/2022   Left facial swelling 01/25/2022   Left ear pain 01/23/2022   Right leg pain 10/15/2021   Microcytic anemia 07/07/2021  De Quervain's tenosynovitis, right 04/18/2021   Iron deficiency anemia 09/08/2020   Common bile duct dilatation 09/06/2020   Opacity of lung on imaging study 09/06/2020   Dizziness 07/10/2020   History of breast cancer 03/01/2020   Genetic testing 11/14/2019   Bilateral hand pain 11/03/2019   Family history of prostate cancer    Adnexal cyst 09/26/2019   Family history of breast cancer    Malignant neoplasm of upper-inner quadrant of right breast in female, estrogen receptor positive ([place]) 09/20/2018   B12 deficiency 04/13/2018   Joint pain 04/11/2018   Colon cancer screening 09/16/2017   Prediabetes 07/28/2016   Allergic rhinitis 11/13/2014   Screening for lipoid disorders 09/26/2014   Preventative health care 09/26/2014   Dysuria 08/07/2014   Routine general medical examination at a health care facility 10/28/2012   GERD 09/16/2010   Hypothyroidism 05/07/2008   Osteopenia 05/07/2008   Migraine with aura 11/11/2007   Past Medical History:  Diagnosis Date   Allergic rhinitis    Allergy     seasonal   Anemia    Anxiety    Arthritis    hands, knees   Asthma    "as a child"   Blood transfusion without reported diagnosis    "as a child"   Breast cancer ([place]) 2019   Right Breast Cancer   Cancer ([place]) 09/2018   Breast CA new DX, right breast   Cervical dysplasia    Common bile duct dilation    Diabetes mellitus without complication ([place])    Endometriosis    Esophageal reflux    Family history of adverse reaction to anesthesia    younger sister anxiety for a dew days after   Family history of breast cancer    Family history of breast cancer    Family history of prostate cancer    Hepatic cyst 08/2020   multiple   Hepatic hemangioma 08/2020   intrahepatic hemangioma   Hiatal hernia    HSV-1 (herpes simplex virus 1) infection    Hypertension    pt.denies 10/19/22   Hypothyroid    Migraine with aura, without mention of intractable migraine without mention of status migrainosus    Osteopenia 08/2019   T score -2.2 FRAX 11% / 1.7%   Personal history of radiation therapy 2019   Right Breast Cancer   Pre-diabetes    Raynaud's disease    Family History  Problem Relation Age of Onset   Breast cancer Mother 43   Diabetes Mother    Irritable bowel syndrome Mother    Hypertension Father    Heart disease Father    Hypertension Sister    Anuerysm Sister        head   Irritable bowel syndrome Sister    Hypertension Sister    Anxiety disorder Sister    Other Sister        prediabetes   Celiac disease Brother    Osteoporosis Maternal Grandmother    Diabetes Maternal Grandmother    COPD Paternal Grandmother    Heart disease Paternal Grandfather    Breast cancer Cousin 75       pat first cousin   Breast cancer Cousin        mat second cousin with breast cancer in her 63s   Thyroid cancer Cousin 27       pat first cousin   Prostate cancer Other 83   Colon cancer Neg Hx    Stomach cancer Neg Hx    Esophageal  cancer Neg Hx    Pancreatic cancer Neg Hx    Rectal  cancer Neg Hx    Colon polyps Neg Hx    Crohn's disease Neg Hx    Ulcerative colitis Neg Hx    Past Surgical History:  Procedure Laterality Date   BIOPSY  09/12/2020   Procedure: BIOPSY;  Surgeon: [name], MD;  Location: [facility];  Service: Endoscopy;;   BREAST BIOPSY Left 11/27/2022   MM LT BREAST BX W LOC DEV 1ST LESION IMAGE BX [place] STEREO GUIDE 11/27/2022 [facility]   BREAST EXCISIONAL BIOPSY Bilateral over 10 years ago   benign x 2    BREAST LUMPECTOMY Right 10/10/2018   BREAST LUMPECTOMY WITH RADIOACTIVE SEED AND SENTINEL LYMPH NODE BIOPSY Right 10/10/2018   Procedure: RIGHT BREAST LUMPECTOMY WITH RADIOACTIVE SEED AND RIGHT SENTINEL LYMPH NODE BIOPSY;  Surgeon: [name], MD;  Location: [facility];  Service: General;  Laterality: Right;   BREAST SURGERY     Benign breast lump excised   CERVICAL [place] BIOPSY  1985   severe dysplasia   COLONOSCOPY  2005 and dec 2019   normal   COLPOSCOPY     endoscopy  10/2018   ESOPHAGOGASTRODUODENOSCOPY (EGD) WITH PROPOFOL N/A 09/12/2020   Procedure: ESOPHAGOGASTRODUODENOSCOPY (EGD) WITH PROPOFOL;  Surgeon: [name], MD;  Location: [facility];  Service: Endoscopy;  Laterality: N/A;   EUS N/A 09/12/2020   Procedure: UPPER ENDOSCOPIC ULTRASOUND (EUS) RADIAL;  Surgeon: [name], MD;  Location: [facility];  Service: Endoscopy;  Laterality: N/A;   LAPAROSCOPIC ASSISTED VAGINAL HYSTERECTOMY     LAPAROSCOPIC BILATERAL SALPINGO OOPHERECTOMY Bilateral 10/27/2019   Procedure: LAPAROSCOPIC BILATERAL SALPINGO OOPHORECTOMY WITH PERITONEAL WASHINGS;  Surgeon: Princess Bruins, MD;  Location: [facility];  Service: Gynecology;  Laterality: Bilateral;  request 1:00pm on Friday, Dec. 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

## 2023-02-15 NOTE — Progress Notes (Unsigned)
Functional Pain Scale - descriptive words and definitions  Distracting (5)    Aware of pain/able to complete some ADL's but limited by pain/sleep is affected and active distractions are only slightly useful. Moderate range order  Average Pain  varies but is at a 1  Neck and upper back pain on the right side. Had a fall on 01/19/23, lifting right arm is difficult. Uses Lidocaine patches, TENS unit and muscle relaxers for the pain

## 2023-02-16 MED ORDER — KETOROLAC TROMETHAMINE 30 MG/ML IJ SOLN
30.0000 mg | INTRAMUSCULAR | Status: AC | PRN
  Administered 2023-02-15: 30 mg via INTRAMUSCULAR

## 2023-02-16 NOTE — Progress Notes (Signed)
   02/16/23 0855  Fall Screening  Falls in the past year? 0  Number of falls in past year 0  Was there an injury with Fall? 0  Fall Risk Category Calculator 0  Fall Risk  Fall risk Follow up Education provided;Falls prevention discussed;Falls evaluation completed

## 2023-02-19 ENCOUNTER — Telehealth: Payer: Self-pay | Admitting: *Deleted

## 2023-02-19 NOTE — Telephone Encounter (Signed)
        Patient  visited [facility] on 02/13/2023  for treatment   Telephone encounter attempt :  1st  A HIPAA compliant voice message was left requesting a return call.  Instructed patient to call back at [phone].  Jennifer [name] [facility] [place], Population [facility] [phone] [address] , [place] [address] Email : [email or web address]       

## 2023-02-24 ENCOUNTER — Telehealth: Payer: Self-pay | Admitting: *Deleted

## 2023-02-24 DIAGNOSIS — M791 Myalgia, unspecified site: Secondary | ICD-10-CM | POA: Diagnosis not present

## 2023-02-24 DIAGNOSIS — G518 Other disorders of facial nerve: Secondary | ICD-10-CM | POA: Diagnosis not present

## 2023-02-24 DIAGNOSIS — G43719 Chronic migraine without aura, intractable, without status migrainosus: Secondary | ICD-10-CM | POA: Diagnosis not present

## 2023-02-24 DIAGNOSIS — M542 Cervicalgia: Secondary | ICD-10-CM | POA: Diagnosis not present

## 2023-02-24 NOTE — Telephone Encounter (Signed)
        Patient  visited [facility] on 02/13/2023  for treatment     Telephone encounter attempt :  2nd  A HIPAA compliant voice message was left requesting a return call.  Instructed patient to call back at [phone]. [place] [phone] [address] , [place] [address] Email : [email or web address]

## 2023-02-27 ENCOUNTER — Other Ambulatory Visit: Payer: Self-pay | Admitting: Family Medicine

## 2023-03-01 ENCOUNTER — Ambulatory Visit: Payer: Medicare HMO | Admitting: Rehabilitative and Restorative Service Providers"

## 2023-03-01 ENCOUNTER — Encounter: Payer: Self-pay | Admitting: Rehabilitative and Restorative Service Providers"

## 2023-03-01 ENCOUNTER — Telehealth: Payer: Self-pay | Admitting: Pharmacist

## 2023-03-01 ENCOUNTER — Other Ambulatory Visit: Payer: Self-pay

## 2023-03-01 DIAGNOSIS — R293 Abnormal posture: Secondary | ICD-10-CM

## 2023-03-01 DIAGNOSIS — M542 Cervicalgia: Secondary | ICD-10-CM | POA: Diagnosis not present

## 2023-03-01 DIAGNOSIS — M25511 Pain in right shoulder: Secondary | ICD-10-CM | POA: Diagnosis not present

## 2023-03-01 DIAGNOSIS — G8929 Other chronic pain: Secondary | ICD-10-CM

## 2023-03-01 DIAGNOSIS — M546 Pain in thoracic spine: Secondary | ICD-10-CM | POA: Diagnosis not present

## 2023-03-01 DIAGNOSIS — M6281 Muscle weakness (generalized): Secondary | ICD-10-CM | POA: Diagnosis not present

## 2023-03-01 DIAGNOSIS — G43109 Migraine with aura, not intractable, without status migrainosus: Secondary | ICD-10-CM

## 2023-03-01 DIAGNOSIS — M8580 Other specified disorders of bone density and structure, unspecified site: Secondary | ICD-10-CM

## 2023-03-01 DIAGNOSIS — R7303 Prediabetes: Secondary | ICD-10-CM

## 2023-03-01 NOTE — Telephone Encounter (Signed)
PharmD reviewed patient chart to assess eligibility for [facility] services. Patient was determined to be a good candidate for the program given the complexity of the medication regimen and overall risk for hospitalization and/or high healthcare utilization.   Referral entered in order to outreach patient and offer appointment with PharmD. Referral cosigned to PCP.  

## 2023-03-01 NOTE — Therapy (Addendum)
OUTPATIENT PHYSICAL THERAPY EVALUATION   Patient Name: Jennifer [name] MRN: [id number] DOB:[birth year], 67 y.o., female Today's Date: 03/01/2023  END OF SESSION:  PT End of Session - 03/01/23 1106     Visit Number 1    Number of Visits 24    Date for PT Re-Evaluation 05/24/23    Authorization Type [employer] $25 copay    Authorization - Visit Number 1    Authorization - Number of Visits 12    Progress Note Due on Visit 10    PT Start Time 1107    PT Stop Time 1145    PT Time Calculation (min) 38 min    Activity Tolerance Patient tolerated treatment well    Behavior During Therapy WFL for tasks assessed/performed             Past Medical History:  Diagnosis Date   Allergic rhinitis    Allergy    seasonal   Anemia    Anxiety    Arthritis    hands, knees   Asthma    "as a child"   Blood transfusion without reported diagnosis    "as a child"   Breast cancer 2019   Right Breast Cancer   Cancer 09/2018   Breast CA new DX, right breast   Cervical dysplasia    Common bile duct dilation    Diabetes mellitus without complication    Endometriosis    Esophageal reflux    Family history of adverse reaction to anesthesia    younger sister anxiety for a dew days after   Family history of breast cancer    Family history of breast cancer    Family history of prostate cancer    Hepatic cyst 08/2020   multiple   Hepatic hemangioma 08/2020   intrahepatic hemangioma   Hiatal hernia    HSV-1 (herpes simplex virus 1) infection    Hypertension    pt.denies 10/19/22   Hypothyroid    Migraine with aura, without mention of intractable migraine without mention of status migrainosus    Osteopenia 08/2019   T score -2.2 FRAX 11% / 1.7%   Personal history of radiation therapy 2019   Right Breast Cancer   Pre-diabetes    Raynaud's disease    Past Surgical History:  Procedure Laterality Date   BIOPSY  09/12/2020   Procedure: BIOPSY;  Surgeon: Rachael Fee, MD;   Location: [facility];  Service: Endoscopy;;   BREAST BIOPSY Left 11/27/2022   MM LT BREAST BX W LOC DEV 1ST LESION IMAGE BX SPEC STEREO GUIDE 11/27/2022 [facility]   BREAST EXCISIONAL BIOPSY Bilateral over 10 years ago   benign x 2    BREAST LUMPECTOMY Right 10/10/2018   BREAST LUMPECTOMY WITH RADIOACTIVE SEED AND SENTINEL LYMPH NODE BIOPSY Right 10/10/2018   Procedure: RIGHT BREAST LUMPECTOMY WITH RADIOACTIVE SEED AND RIGHT SENTINEL LYMPH NODE BIOPSY;  Surgeon: Chevis Pretty III, MD;  Location: [facility];  Service: General;  Laterality: Right;   BREAST SURGERY     Benign breast lump excised   CERVICAL CONE BIOPSY  1985   severe dysplasia   COLONOSCOPY  2005 and dec 2019   normal   COLPOSCOPY     endoscopy  10/2018   ESOPHAGOGASTRODUODENOSCOPY (EGD) WITH PROPOFOL N/A 09/12/2020   Procedure: ESOPHAGOGASTRODUODENOSCOPY (EGD) WITH PROPOFOL;  Surgeon: Rachael Fee, MD;  Location: [facility];  Service: Endoscopy;  Laterality: N/A;   EUS N/A 09/12/2020   Procedure: UPPER ENDOSCOPIC ULTRASOUND (EUS)  RADIAL;  Surgeon: Rachael Fee, MD;  Location: [facility];  Service: Endoscopy;  Laterality: N/A;   LAPAROSCOPIC ASSISTED VAGINAL HYSTERECTOMY     LAPAROSCOPIC BILATERAL SALPINGO OOPHERECTOMY Bilateral 10/27/2019   Procedure: LAPAROSCOPIC BILATERAL SALPINGO OOPHORECTOMY WITH PERITONEAL WASHINGS;  Surgeon: Genia Del, MD;  Location: [facility];  Service: Gynecology;  Laterality: Bilateral;  request 1:00pm on Friday, Dec. 4th in [place] Iqueue time held requests one hour OR time   moles removed from upper back, face lft, 1 between breasts, upper leg left inside  10/18/2019   wearing small round bandaids on for 2 weeks   ROTATOR CUFF REPAIR Bilateral 08/2008   UPPER GASTROINTESTINAL ENDOSCOPY     Patient Active Problem List   Diagnosis Date Noted   Right hand pain 01/21/2023   Right ankle pain 01/21/2023   Thoracic back pain 01/21/2023    Pain in right shoulder 08/12/2022   Hordeolum externum (stye) 08/04/2022   Raynaud's disease 03/17/2022   Left facial swelling 01/25/2022   Left ear pain 01/23/2022   Right leg pain 10/15/2021   Microcytic anemia 07/07/2021   De Quervain's tenosynovitis, right 04/18/2021   Iron deficiency anemia 09/08/2020   Common bile duct dilatation 09/06/2020   Opacity of lung on imaging study 09/06/2020   Dizziness 07/10/2020   History of breast cancer 03/01/2020   Genetic testing 11/14/2019   Bilateral hand pain 11/03/2019   Family history of prostate cancer    Adnexal cyst 09/26/2019   Family history of breast cancer    Malignant neoplasm of upper-inner quadrant of right breast in female, estrogen receptor positive 09/20/2018   B12 deficiency 04/13/2018   Joint pain 04/11/2018   Colon cancer screening 09/16/2017   Prediabetes 07/28/2016   Allergic rhinitis 11/13/2014   Screening for lipoid disorders 09/26/2014   Preventative health care 09/26/2014   Dysuria 08/07/2014   Routine general medical examination at a health care facility 10/28/2012   GERD 09/16/2010   Hypothyroidism 05/07/2008   Osteopenia 05/07/2008   Migraine with aura 11/11/2007    PCP: Judy Pimple MD  REFERRING PROVIDER: Juanda Chance, NP  REFERRING DIAG: [phone] (ICD-10-CM) - Chronic upper back pain M25.511,G89.29 (ICD-10-CM) - Chronic right shoulder pain M79.601 (ICD-10-CM) - Pain of right upper extremity M79.18 (ICD-10-CM) - Myofascial pain syndrome  THERAPY DIAG:  Cervicalgia  Pain in thoracic spine  Chronic right shoulder pain  Abnormal posture  Muscle weakness (generalized)  Rationale for Evaluation and Treatment: Rehabilitation  ONSET DATE: 01/21/2023  SUBJECTIVE:  SUBJECTIVE  STATEMENT: She indicated she was going to get up and put foot on sink to look at foot and fell back in bathroom and landed on back. She indicated having complaints in Rt shoulder/neck/back area initially and continued complaints since.  Had increase in symptoms with lifting/carrying items and traveling to [place] in days after fall.  Saw ED in [place] with no specific acute findings on imaging was reported at that time per PT.   She indicated complaints of Rt neck/shoulder pain c headaches on Rt side of head as well.  Reported trouble sleeping due to symptoms.   PERTINENT HISTORY:  Arthritis, history of Rt breast cancer, DM, osteopenia, raynauds disease.  History of bilateral shoulder surgeries.   PAIN:  NPRS scale: current 1/10, at worst in last few weeks 4/10.  Pain location: Rt cervical, upper trap, back/shoulder with headaches.  Pain description: headaches, tightness, sharp pain, throbbing.  Aggravating factors: lifting, carrying, prolonged sitting, head movements Relieving factors: muscle relaxers, patches, TENS unit  PRECAUTIONS: None  WEIGHT BEARING RESTRICTIONS: No  FALLS:  Has patient fallen in last 6 months? Yes. Number of falls 1 fall No fear of falls reported.   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  Trigger points noted in Rt upper trap, levator,  infraspinatus, Rt cervical paraspinals.  Concordant symptoms noted with trigger points.   CERVICAL ROM:   ROM AROM (deg) 03/01/2023  Flexion 55 c stretch feel  Extension 42  Right lateral flexion   Left lateral flexion   Right rotation 48 c Rt cervical pain  Left rotation 48   (Blank rows = not tested)  UPPER EXTREMITY MMT:  MMT Right 03/01/2023 Left 03/01/2023  Shoulder flexion 5/5 5/5  Shoulder extension    Shoulder abduction 4+/5 4+/5  Shoulder adduction    Shoulder extension    Shoulder internal rotation 5/5 5/5  Shoulder external rotation 4/5 5/5  Elbow flexion    Elbow extension    Wrist flexion    Wrist extension    Wrist ulnar deviation    Wrist radial deviation    Wrist pronation    Wrist supination     (Blank rows = not tested)  UPPER EXTREMITY ROM:  ROM Right 03/01/2023 Left 03/01/2023  Shoulder flexion    Shoulder extension    Shoulder abduction    Shoulder adduction    Shoulder extension    Shoulder internal rotation    Shoulder external rotation    Middle trapezius    Lower trapezius    Elbow flexion    Elbow extension    Wrist flexion    Wrist extension    Wrist ulnar deviation    Wrist radial deviation    Wrist pronation    Wrist supination    Grip strength     (Blank rows = not tested)  CERVICAL SPECIAL TESTS:  03/01/2023 No specific testing today due to pain levels. (Check distraction benefits in  future)  FUNCTIONAL TESTS:  03/01/2023 No specific testing today   TODAY'S TREATMENT:                                                                           DATE: 03/01/2023 Therex:    HEP instruction/performance c cues for techniques, handout provided.  Trial set performed of each for comprehension and symptom assessment.  See below for exercise list  Additional time spent in education of central sensitization response, trigger point presence and treatment options (compression c movement, percussive device, stretching, possible DN).   PATIENT  EDUCATION:  03/01/2023 Education details: HEP, POC, DN handout Person educated: Patient Education method: Explanation, Demonstration, Verbal cues, and Handouts Education comprehension: verbalized understanding, returned demonstration, and verbal cues required  HOME EXERCISE PROGRAM: Access Code: [email or web address] URL: [email or web address] Date: 03/01/2023 Prepared by: Chyrel Masson  Exercises - Seated Upper Trapezius Stretch  - 2 x daily - 7 x weekly - 1 sets - 5 reps - 15 hold - Gentle Levator Scapulae Stretch (Mirrored)  - 2 x daily - 7 x weekly - 1 sets - 5 reps - 15 hold - Standing Shoulder Posterior Capsule Stretch (Mirrored)  - 2 x daily - 7 x weekly - 1 sets - 5 reps - 15 hold - Seated Scapular Retraction  - 3-5 x daily - 7 x weekly - 1 sets - 10 reps - 3-5 hold  ASSESSMENT:  CLINICAL IMPRESSION: Patient is a 67 y.o. who comes to clinic with complaints of cervical, Rt shoulder/thoracic pain with mobility, strength and movement coordination deficits that impair their ability to perform usual daily and recreational functional activities without increase difficulty/symptoms at this time.  Patient to benefit from skilled PT services to address impairments and limitations to improve to previous level of function without restriction secondary to condition.    OBJECTIVE IMPAIRMENTS: decreased activity tolerance, decreased endurance, decreased mobility, decreased ROM, decreased strength, increased fascial restrictions, impaired perceived functional ability, increased muscle spasms, impaired flexibility, impaired UE functional use, improper body mechanics, postural dysfunction, and pain.   ACTIVITY LIMITATIONS: carrying, lifting, bending, sitting, standing, sleeping, bed mobility, reach over head, and hygiene/grooming  PARTICIPATION LIMITATIONS: meal prep, cleaning, laundry, interpersonal relationship, driving, shopping, community activity, and occupation  PERSONAL FACTORS: Time  since onset of injury/illness/exacerbation , severity of symptoms, history of Arthritis, history of Rt breast cancer, DM, osteopenia, raynauds diseaseare also affecting patient's functional outcome.   REHAB POTENTIAL: Good  CLINICAL DECISION MAKING: Stable/uncomplicated  EVALUATION COMPLEXITY: Low   GOALS: Goals reviewed with patient? Yes  SHORT TERM GOALS: (target date for Short term goals are 3 weeks 03/22/2023)  1.Patient will demonstrate independent use of home exercise program to maintain progress from in clinic treatments. Goal status: New  LONG TERM GOALS: (target dates for all long term goals are 12 weeks  05/24/2023 )   1. Patient will demonstrate/report pain at worst less than or equal to 2/10 to facilitate minimal limitation in daily activity secondary to pain symptoms. Goal status: New   2. Patient will demonstrate independent use of home exercise program to facilitate ability to maintain/progress functional gains from skilled physical therapy services. Goal status: New   3. Patient will demonstrate FOTO outcome > or =  61 % to indicate reduced disability due to condition. Goal status: New   4.  Patient will demonstrate cervical AROM WFL s symptoms to facilitate usual head movements for daily activity including driving, self care.   Goal status: New   5.  Patient will demonstrate Rt shoulder MMT 5/5 throughout s symptoms to facilitate usual daily activity at PLOF.   Goal status: New   6.  Patient will demonstrate/report ability to sleep s symptoms disruptions.  Goal status: New   7.  Patient will demonstrate/report ability to return to gym workouts for general health considerations.  Goal Status: New   PLAN:  PT FREQUENCY: 1-2x/week  PT DURATION: 12 weeks  PLANNED INTERVENTIONS: Therapeutic exercises, Therapeutic activity, Neuro Muscular re-education, Balance training, Gait training, Patient/Family education, Joint mobilization, Stair training, DME  instructions, Dry Needling, Electrical stimulation, Cryotherapy, vasopneumatic device,Traction, Moist heat, Taping, Ultrasound, Ionotophoresis 4mg /ml Dexamethasone, and aquatic therapy, Manual therapy.  All included unless contraindicated  PLAN FOR NEXT SESSION: Check HEP use/response.  Possible DN pending Pt approval.  Myofascial release techniques manually.    Chyrel Masson, PT, DPT, OCS, ATC 03/01/23  12:10 PM    Referring diagnosis? M54.9,G89.29 (ICD-10-CM) - Chronic upper back pain Treatment diagnosis? (if different than referring diagnosis) M54.2 What was this (referring dx) caused by? []  Surgery [x]  Fall []  Ongoing issue []  Arthritis []  Other: ____________  Laterality: [x]  Rt []  Lt []  Both  Check all possible CPT codes:  *CHOOSE 10 OR LESS*    []  97110 (Therapeutic Exercise)  []  92507 (SLP Treatment)  []  97112 (Neuro Re-ed)   []  92526 (Swallowing Treatment)   []  97116 (Gait Training)   []  [id number] (Cognitive Training, 1st 15 minutes) []  97140 (Manual Therapy)   []  97130 (Cognitive Training, each add'l 15 minutes)  []  97164 (Re-evaluation)                              []  Other, List CPT Code ____________  []  97530 (Therapeutic Activities)     []  97535 (Self Care)   [x]  All codes above (97110 - 97535)  []  97012 (Mechanical Traction)  [x]  97014 (E-stim Unattended)  []  97032 (E-stim manual)  []  97033 (Ionto)  []  97035 (Ultrasound) [x]  97750 (Physical Performance Training) []  [id number] (Aquatic Therapy) []  97016 (Vasopneumatic Device) []  [id number] (Paraffin) []  97034 (Contrast Bath) []  97597 (Wound Care 1st 20 sq cm) []  97598 (Wound Care each add'l 20 sq cm) []  97760 (Orthotic Fabrication, Fitting, Training Initial) []  16109 (Prosthetic Management and Training Initial) []  [id number] (Orthotic or Prosthetic Training/ Modification Subsequent)

## 2023-03-03 ENCOUNTER — Ambulatory Visit: Payer: Medicare HMO | Admitting: Rehabilitative and Restorative Service Providers"

## 2023-03-03 ENCOUNTER — Encounter: Payer: Self-pay | Admitting: Rehabilitative and Restorative Service Providers"

## 2023-03-03 DIAGNOSIS — R293 Abnormal posture: Secondary | ICD-10-CM | POA: Diagnosis not present

## 2023-03-03 DIAGNOSIS — M542 Cervicalgia: Secondary | ICD-10-CM | POA: Diagnosis not present

## 2023-03-03 DIAGNOSIS — G8929 Other chronic pain: Secondary | ICD-10-CM

## 2023-03-03 DIAGNOSIS — M546 Pain in thoracic spine: Secondary | ICD-10-CM

## 2023-03-03 DIAGNOSIS — M6281 Muscle weakness (generalized): Secondary | ICD-10-CM | POA: Diagnosis not present

## 2023-03-03 DIAGNOSIS — M25511 Pain in right shoulder: Secondary | ICD-10-CM | POA: Diagnosis not present

## 2023-03-03 NOTE — Therapy (Signed)
OUTPATIENT PHYSICAL THERAPY TREATMENT   Patient Name: Jennifer [name] MRN: [id number] DOB:[birth year], 67 y.o., female Today's Date: 03/03/2023  END OF SESSION:  PT End of Session - 03/03/23 1023     Visit Number 2    Number of Visits 24    Date for PT Re-Evaluation 05/24/23    Authorization Type [employer] $25 copay    Authorization - Number of Visits 12    Progress Note Due on Visit 10    PT Start Time 1020    PT Stop Time 1058    PT Time Calculation (min) 38 min    Activity Tolerance Patient tolerated treatment well    Behavior During Therapy WFL for tasks assessed/performed              Past Medical History:  Diagnosis Date   Allergic rhinitis    Allergy    seasonal   Anemia    Anxiety    Arthritis    hands, knees   Asthma    "as a child"   Blood transfusion without reported diagnosis    "as a child"   Breast cancer 2019   Right Breast Cancer   Cancer 09/2018   Breast CA new DX, right breast   Cervical dysplasia    Common bile duct dilation    Diabetes mellitus without complication    Endometriosis    Esophageal reflux    Family history of adverse reaction to anesthesia    younger sister anxiety for a dew days after   Family history of breast cancer    Family history of breast cancer    Family history of prostate cancer    Hepatic cyst 08/2020   multiple   Hepatic hemangioma 08/2020   intrahepatic hemangioma   Hiatal hernia    HSV-1 (herpes simplex virus 1) infection    Hypertension    pt.denies 10/19/22   Hypothyroid    Migraine with aura, without mention of intractable migraine without mention of status migrainosus    Osteopenia 08/2019   T score -2.2 FRAX 11% / 1.7%   Personal history of radiation therapy 2019   Right Breast Cancer   Pre-diabetes    Raynaud's disease    Past Surgical History:  Procedure Laterality Date   BIOPSY  09/12/2020   Procedure: BIOPSY;  Surgeon: Rachael Fee, MD;  Location: [facility];  Service:  Endoscopy;;   BREAST BIOPSY Left 11/27/2022   MM LT BREAST BX W LOC DEV 1ST LESION IMAGE BX SPEC STEREO GUIDE 11/27/2022 [facility]   BREAST EXCISIONAL BIOPSY Bilateral over 10 years ago   benign x 2    BREAST LUMPECTOMY Right 10/10/2018   BREAST LUMPECTOMY WITH RADIOACTIVE SEED AND SENTINEL LYMPH NODE BIOPSY Right 10/10/2018   Procedure: RIGHT BREAST LUMPECTOMY WITH RADIOACTIVE SEED AND RIGHT SENTINEL LYMPH NODE BIOPSY;  Surgeon: Chevis Pretty III, MD;  Location: [facility];  Service: General;  Laterality: Right;   BREAST SURGERY     Benign breast lump excised   CERVICAL CONE BIOPSY  1985   severe dysplasia   COLONOSCOPY  2005 and dec 2019   normal   COLPOSCOPY     endoscopy  10/2018   ESOPHAGOGASTRODUODENOSCOPY (EGD) WITH PROPOFOL N/A 09/12/2020   Procedure: ESOPHAGOGASTRODUODENOSCOPY (EGD) WITH PROPOFOL;  Surgeon: Rachael Fee, MD;  Location: [facility];  Service: Endoscopy;  Laterality: N/A;   EUS N/A 09/12/2020   Procedure: UPPER ENDOSCOPIC ULTRASOUND (EUS) RADIAL;  Surgeon: Rachael Fee, MD;  Location: [facility];  Service: Endoscopy;  Laterality: N/A;   LAPAROSCOPIC ASSISTED VAGINAL HYSTERECTOMY     LAPAROSCOPIC BILATERAL SALPINGO OOPHERECTOMY Bilateral 10/27/2019   Procedure: LAPAROSCOPIC BILATERAL SALPINGO OOPHORECTOMY WITH PERITONEAL WASHINGS;  Surgeon: Genia DelLavoie, Marie-Lyne, MD;  [facility];  Service: Gynecology;  Laterality: Bilateral;  request 1:00pm on Friday, Dec. 4th [place] time held requests one hour OR time   moles removed from upper back, face lft, 1 between breasts, upper leg left inside  10/18/2019   wearing small round bandaids on for 2 weeks   ROTATOR CUFF REPAIR Bilateral 08/2008   UPPER GASTROINTESTINAL ENDOSCOPY     Patient Active Problem List   Diagnosis Date Noted   Right hand pain 01/21/2023   Right ankle pain 01/21/2023   Thoracic back pain 01/21/2023   Pain in right shoulder  08/12/2022   Hordeolum externum (stye) 08/04/2022   Raynaud's disease 03/17/2022   Left facial swelling 01/25/2022   Left ear pain 01/23/2022   Right leg pain 10/15/2021   Microcytic anemia 07/07/2021   De Quervain's tenosynovitis, right 04/18/2021   Iron deficiency anemia 09/08/2020   Common bile duct dilatation 09/06/2020   Opacity of lung on imaging study 09/06/2020   Dizziness 07/10/2020   History of breast cancer 03/01/2020   Genetic testing 11/14/2019   Bilateral hand pain 11/03/2019   Family history of prostate cancer    Adnexal cyst 09/26/2019   Family history of breast cancer    Malignant neoplasm of upper-inner quadrant of right breast in female, estrogen receptor positive 09/20/2018   B12 deficiency 04/13/2018   Joint pain 04/11/2018   Colon cancer screening 09/16/2017   Prediabetes 07/28/2016   Allergic rhinitis 11/13/2014   Screening for lipoid disorders 09/26/2014   Preventative health care 09/26/2014   Dysuria 08/07/2014   Routine general medical examination at a health care facility 10/28/2012   GERD 09/16/2010   Hypothyroidism 05/07/2008   Osteopenia 05/07/2008   Migraine with aura 11/11/2007    PCP: Judy Pimpleower, Marne A MD  REFERRING PROVIDER: Juanda ChanceWilliams, Megan E, NP  REFERRING DIAG: [phone],G89.29 (ICD-10-CM) - Chronic upper back pain M25.511,G89.29 (ICD-10-CM) - Chronic right shoulder pain M79.601 (ICD-10-CM) - Pain of right upper extremity M79.18 (ICD-10-CM) - Myofascial pain syndrome  THERAPY DIAG:  Cervicalgia  Pain in thoracic spine  Chronic right shoulder pain  Abnormal posture  Muscle weakness (generalized)  Rationale for Evaluation and Treatment: Rehabilitation  ONSET DATE: 01/21/2023  SUBJECTIVE:  SUBJECTIVE STATEMENT: She indicated feeling  about the same overall. She did report the exercises seemed to feel good.   She indicated having a headache the other day but nothing specific in last day or so.   PERTINENT HISTORY:  Arthritis, history of Rt breast cancer, DM, osteopenia, raynauds disease.  History of bilateral shoulder surgeries.   PAIN:  NPRS scale: no specific pian upon arrival.  Pain location: Rt cervical, upper trap, back/shoulder with headaches.  Pain description: headaches, tightness, sharp pain, throbbing.  Aggravating factors: lifting, carrying, prolonged sitting, head movements Relieving factors: muscle relaxers, patches, TENS unit  PRECAUTIONS: None  WEIGHT BEARING RESTRICTIONS: No  FALLS:  Has patient fallen in last 6 months? Yes. Number of falls 1 fall No fear of falls reported.   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  Trigger points noted in Rt upper trap, levator, infraspinatus, Rt cervical paraspinals.  Concordant symptoms noted with trigger points.   CERVICAL ROM:   ROM AROM (deg) 03/01/2023  Flexion 55 c stretch feel  Extension 42  Right lateral flexion   Left lateral flexion   Right rotation 48 c Rt cervical pain  Left rotation 48   (Blank rows = not tested)  UPPER EXTREMITY MMT:  MMT Right 03/01/2023 Left 03/01/2023  Shoulder flexion 5/5 5/5  Shoulder  extension    Shoulder abduction 4+/5 4+/5  Shoulder adduction    Shoulder extension    Shoulder internal rotation 5/5 5/5  Shoulder external rotation 4/5 5/5  Elbow flexion    Elbow extension    Wrist flexion    Wrist extension    Wrist ulnar deviation    Wrist radial deviation    Wrist pronation    Wrist supination     (Blank rows = not tested)  UPPER EXTREMITY ROM:  ROM Right 03/01/2023 Left 03/01/2023  Shoulder flexion    Shoulder extension    Shoulder abduction    Shoulder adduction    Shoulder extension    Shoulder internal rotation    Shoulder external rotation    Middle trapezius    Lower trapezius    Elbow flexion    Elbow extension    Wrist flexion    Wrist extension    Wrist ulnar deviation    Wrist radial deviation    Wrist pronation    Wrist supination    Grip strength     (Blank rows = not tested)  CERVICAL SPECIAL TESTS:  03/01/2023 No specific testing today due to pain levels. (Check distraction benefits in future)  FUNCTIONAL TESTS:  03/01/2023 No specific testing today   TODAY'S TREATMENT:  DATE: 03/03/2023 Manual: Compression to Rt infraspinatus for trigger point release.  Additional time spent in manual for education of techniques and post manual /dry needling soreness response and care for home to improve.   Trigger Point Dry-Needling  Treatment instructions: Expect mild to moderate muscle soreness. S/S of pneumothorax if dry needled over a lung field, and to seek immediate medical attention should they occur. Patient verbalized understanding of these instructions and education.  Patient Consent Given: Yes Education handout provided: Previously provided Muscles treated: Rt infraspinatus Treatment response/outcome: Concordant symptoms c twitch response.  Fair to good tolerance overall.   Therex: Seated cross arm stretch Rt 15 sec x 5 Tband green rows 2 x 15  Tband gh ext  green 2 x 15  UBE fwd/back 4 mins each way lvl 2.0 for ROM   TODAY'S TREATMENT:                                                                           DATE: 03/01/2023 Therex:    HEP instruction/performance c cues for techniques, handout provided.  Trial set performed of each for comprehension and symptom assessment.  See below for exercise list  Additional time spent in education of central sensitization response, trigger point presence and treatment options (compression c movement, percussive device, stretching, possible DN).   PATIENT EDUCATION:  03/01/2023 Education details: HEP, POC, DN handout Person educated: Patient Education method: Explanation, Demonstration, Verbal cues, and Handouts Education comprehension: verbalized understanding, returned demonstration, and verbal cues required  HOME EXERCISE PROGRAM: Access Code: [email or web address] URL: [email or web address] Date: 03/01/2023 Prepared by: Chyrel Masson  Exercises - Seated Upper Trapezius Stretch  - 2 x daily - 7 x weekly - 1 sets - 5 reps - 15 hold - Gentle Levator Scapulae Stretch (Mirrored)  - 2 x daily - 7 x weekly - 1 sets - 5 reps - 15 hold - Standing Shoulder Posterior Capsule Stretch (Mirrored)  - 2 x daily - 7 x weekly - 1 sets - 5 reps - 15 hold - Seated Scapular Retraction  - 3-5 x daily - 7 x weekly - 1 sets - 10 reps - 3-5 hold  ASSESSMENT:  CLINICAL IMPRESSION: Concordant symptoms noted in Rt shoulder/arm with Rt infraspinatus trigger point treatments today.  Spent additional time in education of post trigger point release soreness and options for self care at home to reduce.  Continued skilled PT services indicated to continue to progression of reaching goals.    OBJECTIVE IMPAIRMENTS: decreased activity tolerance, decreased endurance, decreased mobility, decreased ROM, decreased strength, increased fascial restrictions, impaired perceived functional ability, increased muscle spasms, impaired  flexibility, impaired UE functional use, improper body mechanics, postural dysfunction, and pain.   ACTIVITY LIMITATIONS: carrying, lifting, bending, sitting, standing, sleeping, bed mobility, reach over head, and hygiene/grooming  PARTICIPATION LIMITATIONS: meal prep, cleaning, laundry, interpersonal relationship, driving, shopping, community activity, and occupation  PERSONAL FACTORS: Time since onset of injury/illness/exacerbation , severity of symptoms, history of Arthritis, history of Rt breast cancer, DM, osteopenia, raynauds diseaseare also affecting patient's functional outcome.   REHAB POTENTIAL: Good  CLINICAL DECISION MAKING: Stable/uncomplicated  EVALUATION COMPLEXITY: Low   GOALS: Goals reviewed with patient? Yes  SHORT TERM GOALS: (target date for Short  term goals are 3 weeks 03/22/2023)  1.Patient will demonstrate independent use of home exercise program to maintain progress from in clinic treatments. Goal status: New  LONG TERM GOALS: (target dates for all long term goals are 12 weeks  05/24/2023 )   1. Patient will demonstrate/report pain at worst less than or equal to 2/10 to facilitate minimal limitation in daily activity secondary to pain symptoms. Goal status: New   2. Patient will demonstrate independent use of home exercise program to facilitate ability to maintain/progress functional gains from skilled physical therapy services. Goal status: New   3. Patient will demonstrate FOTO outcome > or = 61 % to indicate reduced disability due to condition. Goal status: New   4.  Patient will demonstrate cervical AROM WFL s symptoms to facilitate usual head movements for daily activity including driving, self care.   Goal status: New   5.  Patient will demonstrate Rt shoulder MMT 5/5 throughout s symptoms to facilitate usual daily activity at PLOF.   Goal status: New   6.  Patient will demonstrate/report ability to sleep s symptoms disruptions.  Goal status: New    7.  Patient will demonstrate/report ability to return to gym workouts for general health considerations.  Goal Status: New   PLAN:  PT FREQUENCY: 1-2x/week  PT DURATION: 12 weeks  PLANNED INTERVENTIONS: Therapeutic exercises, Therapeutic activity, Neuro Muscular re-education, Balance training, Gait training, Patient/Family education, Joint mobilization, Stair training, DME instructions, Dry Needling, Electrical stimulation, Cryotherapy, vasopneumatic device,Traction, Moist heat, Taping, Ultrasound, Ionotophoresis 4mg /ml Dexamethasone, and aquatic therapy, Manual therapy.  All included unless contraindicated  PLAN FOR NEXT SESSION: DN as desired.  Progressive scapular strengthening.    Chyrel Masson, PT, DPT, OCS, ATC 03/03/23  10:56 AM    Referring diagnosis? M54.9,G89.29 (ICD-10-CM) - Chronic upper back pain Treatment diagnosis? (if different than referring diagnosis) M54.2 What was this (referring dx) caused by? []  Surgery [x]  Fall []  Ongoing issue []  Arthritis []  Other: ____________  Laterality: [x]  Rt []  Lt []  Both  Check all possible CPT codes:  *CHOOSE 10 OR LESS*    []  97110 (Therapeutic Exercise)  []  92507 (SLP Treatment)  []  97112 (Neuro Re-ed)   []  40102 (Swallowing Treatment)   []  97116 (Gait Training)   []  [id number] (Cognitive Training, 1st 15 minutes) []  97140 (Manual Therapy)   []  97130 (Cognitive Training, each add'l 15 minutes)  []  97164 (Re-evaluation)                              []  Other, List CPT Code ____________  []  97530 (Therapeutic Activities)     []  97535 (Self Care)   [x]  All codes above (97110 - 97535)  []  97012 (Mechanical Traction)  []  97014 (E-stim Unattended)  [x]  97032 (E-stim manual)  []  97033 (Ionto)  []  97035 (Ultrasound) [x]  97750 (Physical Performance Training) []  [id number] (Aquatic Therapy) []  97016 (Vasopneumatic Device) []  [id number] (Paraffin) []  97034 (Contrast Bath) []  97597 (Wound Care 1st 20 sq cm) []  97598 (Wound Care each  add'l 20 sq cm) []  97760 (Orthotic Fabrication, Fitting, Training Initial) []  [id number] (Prosthetic Management and Training Initial) []  [id number] (Orthotic or Prosthetic Training/ Modification Subsequent)

## 2023-03-04 ENCOUNTER — Ambulatory Visit: Payer: Medicare HMO | Admitting: Physician Assistant

## 2023-03-05 ENCOUNTER — Ambulatory Visit (INDEPENDENT_AMBULATORY_CARE_PROVIDER_SITE_OTHER): Payer: Medicare HMO | Admitting: Family Medicine

## 2023-03-05 ENCOUNTER — Encounter: Payer: Self-pay | Admitting: Family Medicine

## 2023-03-05 ENCOUNTER — Ambulatory Visit: Payer: Medicare HMO | Admitting: Physical Medicine and Rehabilitation

## 2023-03-05 VITALS — BP 128/90 | HR 75 | Temp 97.4°F | Ht 59.75 in | Wt 132.4 lb

## 2023-03-05 DIAGNOSIS — G43109 Migraine with aura, not intractable, without status migrainosus: Secondary | ICD-10-CM | POA: Diagnosis not present

## 2023-03-05 DIAGNOSIS — G8929 Other chronic pain: Secondary | ICD-10-CM

## 2023-03-05 DIAGNOSIS — M546 Pain in thoracic spine: Secondary | ICD-10-CM

## 2023-03-05 MED ORDER — PREDNISONE 20 MG PO TABS: ORAL_TABLET | ORAL | 0 refills | Status: DC

## 2023-03-05 NOTE — Patient Instructions (Signed)
I put the referral in for neurology  Please let us know if you don't hear in 1-2 weeks    Take care of yourself  Continue the PT and exercises and orthopedic follow up   I sent in prednisone taper Keep [place] posted

## 2023-03-05 NOTE — Assessment & Plan Note (Signed)
Ongoing- R sided trap/scap/rhomboid Rev ortho note and plan from [facility]- toradol and PT and 6 wk f/u Rev PT note- dry needling did help and she goes again Monday Doing her exercises  Hoping for continued gradual improvement

## 2023-03-05 NOTE — Assessment & Plan Note (Signed)
Ongoing  Not making more progress with trigger point injections  In ER frequently for treatment/headache cocktail Will consider re eval/new neuro practice  Has not had any CGRP  drugs / unsure about botox  Running out of triptan Gets rebound with analgesics Recently worsened by neck and back problems  Will ref to [facility]  Would like to see Dr Lucia Gaskins if possible   Px prednisone taper today for this headache cycle

## 2023-03-05 NOTE — Progress Notes (Signed)
Subjective:    Patient ID: Jennifer [name], female    DOB: 1955/11/26, 67 y.o.   MRN: [id number]  HPI Pt presents for back pain   Wt Readings from Last 3 Encounters:  03/05/23 132 lb 6 oz (60 kg)  02/12/23 132 lb (59.9 kg)  02/02/23 130 lb (59 kg)   26.07 kg/m  Vitals:   03/05/23 1132  BP: (!) 128/90  Pulse: 75  Temp: (!) 97.4 F (36.3 C)  SpO2: 95%     Last visit was 3/22 for throacic back pain  Known cervical DD with some foraminal narrowing  Shoulder film and CT head were ok  We discussed rhomboid stretching/ TENS, heat Baclofen prn (she does not like to use often) and massage prn   Ortho follow up / [facility] Thought to have myofascial pain syndrome with trigger oints in R scapular /trap region and rhomboid  Plan from them: Next step is to place order for formal physical therapy with our in-house team, I do think patient would benefit from manual treatments and dry needling. I did discuss medication management with patient today in detail. She received intramuscular Toradol injection in the office today. I also spoke with her about continuing with Baclofen as prescribed. I would like to see patient back in 6 weeks for reevaluation. No red flag symptoms noted upon exam today.   PT visit was 4/10  Did trigger point dry needling Clinical impression:  Concordant symptoms noted in Rt shoulder/arm with Rt infraspinatus trigger point treatments today.  Spent additional time in education of post trigger point release soreness and options for self care at home to reduce.  Continued skilled PT services indicated to continue to progression of reaching goals.   This did really help  Lasted a few hours then soreness improved   She is doing her stretches -she is doing them -that helps also     Pt thinks if she has predniosne tapers for back and headache she will help in the ER  She thinks the back problem is very much linked to the migraine   Took toradol and baclofen  yesterday  Used TENS unit off and on  Had to take her triptan last night- only 2 left for the month  Called Dr Onnie Boer office re: pred taper/ he was not in   Right now  Head is sore / but not a full blown headache  Back and shoulder hurt   Monday has appt with PT   Patient Active Problem List   Diagnosis Date Noted   Right hand pain 01/21/2023   Right ankle pain 01/21/2023   Thoracic back pain 01/21/2023   Pain in right shoulder 08/12/2022   Hordeolum externum (stye) 08/04/2022   Raynaud's disease 03/17/2022   Left facial swelling 01/25/2022   Left ear pain 01/23/2022   Right leg pain 10/15/2021   Microcytic anemia 07/07/2021   De Quervain's tenosynovitis, right 04/18/2021   Iron deficiency anemia 09/08/2020   Common bile duct dilatation 09/06/2020   Opacity of lung on imaging study 09/06/2020   Dizziness 07/10/2020   History of breast cancer 03/01/2020   Genetic testing 11/14/2019   Bilateral hand pain 11/03/2019   Family history of prostate cancer    Adnexal cyst 09/26/2019   Family history of breast cancer    Malignant neoplasm of upper-inner quadrant of right breast in female, estrogen receptor positive 09/20/2018   B12 deficiency 04/13/2018   Joint pain 04/11/2018   Colon cancer screening  09/16/2017   Prediabetes 07/28/2016   Allergic rhinitis 11/13/2014   Screening for lipoid disorders 09/26/2014   Preventative health care 09/26/2014   Dysuria 08/07/2014   Routine general medical examination at a health care facility 10/28/2012   GERD 09/16/2010   Hypothyroidism 05/07/2008   Osteopenia 05/07/2008   Migraine with aura 11/11/2007   Past Medical History:  Diagnosis Date   Allergic rhinitis    Allergy    seasonal   Anemia    Anxiety    Arthritis    hands, knees   Asthma    "as a child"   Blood transfusion without reported diagnosis    "as a child"   Breast cancer 2019   Right Breast Cancer   Cancer 09/2018   Breast CA new DX, right breast    Cervical dysplasia    Common bile duct dilation    Diabetes mellitus without complication    Endometriosis    Esophageal reflux    Family history of adverse reaction to anesthesia    younger sister anxiety for a dew days after   Family history of breast cancer    Family history of breast cancer    Family history of prostate cancer    Hepatic cyst 08/2020   multiple   Hepatic hemangioma 08/2020   intrahepatic hemangioma   Hiatal hernia    HSV-1 (herpes simplex virus 1) infection    Hypertension    pt.denies 10/19/22   Hypothyroid    Migraine with aura, without mention of intractable migraine without mention of status migrainosus    Osteopenia 08/2019   T score -2.2 FRAX 11% / 1.7%   Personal history of radiation therapy 2019   Right Breast Cancer   Pre-diabetes    Raynaud's disease    Past Surgical History:  Procedure Laterality Date   BIOPSY  09/12/2020   Procedure: BIOPSY;  Surgeon: Rachael Fee, MD;  Location: [facility];  Service: Endoscopy;;   BREAST BIOPSY Left 11/27/2022   MM LT BREAST BX W LOC DEV 1ST LESION IMAGE BX SPEC STEREO GUIDE 11/27/2022 [facility]   BREAST EXCISIONAL BIOPSY Bilateral over 10 years ago   benign x 2    BREAST LUMPECTOMY Right 10/10/2018   BREAST LUMPECTOMY WITH RADIOACTIVE SEED AND SENTINEL LYMPH NODE BIOPSY Right 10/10/2018   Procedure: RIGHT BREAST LUMPECTOMY WITH RADIOACTIVE SEED AND RIGHT SENTINEL LYMPH NODE BIOPSY;  Surgeon: Chevis Pretty III, MD;  Location:  SURGERY CENTER;  Service: General;  Laterality: Right;   BREAST SURGERY     Benign breast lump excised   CERVICAL CONE BIOPSY  1985   severe dysplasia   COLONOSCOPY  2005 and dec 2019   normal   COLPOSCOPY     endoscopy  10/2018   ESOPHAGOGASTRODUODENOSCOPY (EGD) WITH PROPOFOL N/A 09/12/2020   Procedure: ESOPHAGOGASTRODUODENOSCOPY (EGD) WITH PROPOFOL;  Surgeon: Rachael Fee, MD;  Location: [facility];  Service: Endoscopy;  Laterality: N/A;   EUS N/A  09/12/2020   Procedure: UPPER ENDOSCOPIC ULTRASOUND (EUS) RADIAL;  Surgeon: Rachael Fee, MD;  Location: [facility];  Service: Endoscopy;  Laterality: N/A;   LAPAROSCOPIC ASSISTED VAGINAL HYSTERECTOMY     LAPAROSCOPIC BILATERAL SALPINGO OOPHERECTOMY Bilateral 10/27/2019   Procedure: LAPAROSCOPIC BILATERAL SALPINGO OOPHORECTOMY WITH PERITONEAL WASHINGS;  Surgeon: Genia Del, MD;  Location: [facility] [place];  Service: Gynecology;  Laterality: Bilateral;  request 1:00pm on Friday, Dec. 4th in [place] Iqueue time held requests one hour OR time   moles removed from  upper back, face lft, 1 between breasts, upper leg left inside  10/18/2019   wearing small round bandaids on for 2 weeks   ROTATOR CUFF REPAIR Bilateral 08/2008   UPPER GASTROINTESTINAL ENDOSCOPY     Social History   Tobacco Use   Smoking status: Never    Passive exposure: Never   Smokeless tobacco: Never  Vaping Use   Vaping Use: Never used  Substance Use Topics   Alcohol use: No    Alcohol/week: 0.0 standard drinks of alcohol   Drug use: No   Family History  Problem Relation Age of Onset   Breast cancer Mother 22   Diabetes Mother    Irritable bowel syndrome Mother    Hypertension Father    Heart disease Father    Hypertension Sister    Anuerysm Sister        head   Irritable bowel syndrome Sister    Hypertension Sister    Anxiety disorder Sister    Other Sister        prediabetes   Celiac disease Brother    Osteoporosis Maternal Grandmother    Diabetes Maternal Grandmother    COPD Paternal Grandmother    Heart disease Paternal Grandfather    Breast cancer Cousin 84       pat first cousin   Breast cancer Cousin        mat second cousin with breast cancer in her 30s   Thyroid cancer Cousin 41       pat first cousin   Prostate cancer Other 63   Colon cancer Neg Hx    Stomach cancer Neg Hx    Esophageal cancer Neg Hx    Pancreatic cancer Neg Hx    Rectal cancer Neg Hx     Colon polyps Neg Hx    Crohn's disease Neg Hx    Ulcerative colitis Neg Hx    Allergies  Allergen Reactions   Ciprofloxacin Other (See Comments)    Dizziness    Codeine Phosphate Other (See Comments)    Dizziness, heart palpitations, nervous   Decongestant [Pseudoephedrine] Other (See Comments)    dizziness   Diclofenac    Flonase [Fluticasone Propionate] Other (See Comments)    Worsens her migraine    Hydrocodone Other (See Comments)    Made nervous--10/09 surgery rotator cuff   Macrobid [Nitrofurantoin]     headaches   Nasonex [Mometasone]     Migraines   Reglan [Metoclopramide] Other (See Comments)    Dizziness   Topamax [Topiramate] Other (See Comments)    Dizziness    Benadryl [Diphenhydramine Hcl] Palpitations   Sulfa Antibiotics Anxiety   Current Outpatient Medications on File Prior to Visit  Medication Sig Dispense Refill   ALPRAZolam (XANAX) 0.5 MG tablet TAKE 1/2 TO 1 TABLET BY MOUTH ONCE DAILY AS NEEDED FOR SLEEP/ANXIETY 15 tablet 2   atenolol (TENORMIN) 25 MG tablet Take 12.5 mg by mouth 3 (three) times daily. For migraine prevention     B Complex Vitamins (B COMPLEX PO) Take by mouth.     baclofen (LIORESAL) 10 MG tablet Take by mouth as needed.     CALCIUM CARBONATE-VITAMIN D PO Take 1 tablet by mouth daily. 1000 mg     famotidine (PEPCID) 40 MG tablet Take 40 mg by mouth 2 (two) times daily.      hydrocortisone 2.5 % cream Apply to affected areas BID PRN as directed. 30 g 3   hydrOXYzine (ATARAX) 25 MG tablet Take 37.5 mg by  mouth at bedtime as needed.     ketorolac (TORADOL) 10 MG tablet Take 1 tablet (10 mg total) by mouth every 6 (six) hours as needed for moderate pain. 12 tablet 0   letrozole (FEMARA) 2.5 MG tablet TAKE 1 TABLET BY MOUTH EVERY DAY 90 tablet 3   levocetirizine (XYZAL) 5 MG tablet Take 5 mg by mouth at bedtime.   3   levothyroxine (SYNTHROID) 75 MCG tablet TAKE 1 TABLET BY MOUTH DAILY BEFORE BREAKFAST 90 tablet 1   Magnesium Oxide 400 (240  Mg) MG TABS Take 400 mg by mouth daily.      metFORMIN (GLUCOPHAGE-XR) 500 MG 24 hr tablet TAKE 1 TABLET BY MOUTH EVERY DAY WITH BREAKFAST 90 tablet 1   Multiple Vitamin (MULTIVITAMIN WITH MINERALS) TABS tablet Take 1 tablet by mouth daily.     nabumetone (RELAFEN) 500 MG tablet Take 500 mg by mouth 2 (two) times daily.     neomycin-polymyxin b-dexamethasone (MAXITROL) 3.5-10000-0.1 SUSP 1 drop 3 (three) times daily.     Omega-3 Fatty Acids (FISH OIL) 1000 MG CAPS Take 1,000 mg by mouth daily.      OnabotulinumtoxinA (BOTOX IJ) Inject 1 Dose as directed every 6 (six) weeks.      ondansetron (ZOFRAN ODT) 4 MG disintegrating tablet Take 1 tablet (4 mg total) by mouth every 8 (eight) hours as needed. 20 tablet 0   pantoprazole (PROTONIX) 20 MG tablet Take 1 tablet (20 mg total) by mouth daily. 30 tablet 5   PRESCRIPTION MEDICATION Inject into the skin every 6 (six) weeks. Steroid Trigger     Probiotic Product (PROBIOTIC-10 PO) Take 1 capsule by mouth daily.      promethazine (PHENERGAN) 25 MG tablet Take 25 mg by mouth daily as needed.     traMADol (ULTRAM) 50 MG tablet Take 0.5 tablets (25 mg total) by mouth every 8 (eight) hours as needed (cough). 10 tablet 0   triamcinolone (KENALOG) 0.1 % Apply to affected areas BID PRN for up to two weeks at a time as directed. 45 g 3   TURMERIC PO Take 1 tablet by mouth daily.     rizatriptan (MAXALT) 10 MG tablet Take 1 tablet (10 mg total) by mouth once as needed for up to 10 days for migraine. May repeat in 2 hours if needed 10 tablet 0   No current facility-administered medications on file prior to visit.      Review of Systems  Constitutional:  Positive for fatigue. Negative for activity change, appetite change, fever and unexpected weight change.  HENT:  Negative for congestion, ear pain, rhinorrhea, sinus pressure and sore throat.   Eyes:  Negative for pain, redness and visual disturbance.  Respiratory:  Negative for cough, shortness of breath and  wheezing.   Cardiovascular:  Negative for chest pain and palpitations.  Gastrointestinal:  Negative for abdominal pain, blood in stool, constipation and diarrhea.  Endocrine: Negative for polydipsia and polyuria.  Genitourinary:  Negative for dysuria, frequency and urgency.  Musculoskeletal:  Positive for back pain, neck pain and neck stiffness. Negative for arthralgias and myalgias.  Skin:  Negative for pallor and rash.  Allergic/Immunologic: Negative for environmental allergies.  Neurological:  Positive for headaches. Negative for dizziness and syncope.  Hematological:  Negative for adenopathy. Does not bruise/bleed easily.  Psychiatric/Behavioral:  Negative for decreased concentration and dysphoric mood. The patient is nervous/anxious.        Objective:   Physical Exam Constitutional:      General: She  is not in acute distress.    Appearance: Normal appearance. She is well-developed and normal weight. She is not ill-appearing or diaphoretic.  HENT:     Head: Normocephalic and atraumatic.     Right Ear: External ear normal.     Left Ear: External ear normal.     Nose: Nose normal.     Mouth/Throat:     Pharynx: No oropharyngeal exudate.  Eyes:     General: No scleral icterus.       Right eye: No discharge.        Left eye: No discharge.     Conjunctiva/sclera: Conjunctivae normal.     Pupils: Pupils are equal, round, and reactive to light.     Comments: No nystagmus  Neck:     Thyroid: No thyromegaly.     Vascular: No carotid bruit or JVD.     Trachea: No tracheal deviation.  Cardiovascular:     Rate and Rhythm: Normal rate and regular rhythm.     Heart sounds: Normal heart sounds. No murmur heard. Pulmonary:     Effort: Pulmonary effort is normal. No respiratory distress.     Breath sounds: Normal breath sounds. No wheezing or rales.  Abdominal:     General: Bowel sounds are normal. There is no distension.     Palpations: Abdomen is soft. There is no mass.      Tenderness: There is no abdominal tenderness.  Musculoskeletal:        General: No tenderness.     Cervical back: Full passive range of motion without pain, normal range of motion and neck supple.     Comments: Some R trap and rhomboid  tenderness and tightness Nl spinal rom  Lymphadenopathy:     Cervical: No cervical adenopathy.  Skin:    General: Skin is warm and dry.     Coloration: Skin is not pale.     Findings: No rash.  Neurological:     Mental Status: She is alert and oriented to person, place, and time.     Cranial Nerves: No cranial nerve deficit.     Sensory: No sensory deficit.     Motor: No tremor, atrophy or abnormal muscle tone.     Coordination: Coordination normal.     Gait: Gait normal.     Deep Tendon Reflexes: Reflexes are normal and symmetric. Reflexes normal.     Comments: No focal cerebellar signs   Psychiatric:        Mood and Affect: Mood is anxious.        Behavior: Behavior normal.        Thought Content: Thought content normal.           Assessment & Plan:   Problem List Items Addressed This Visit       Cardiovascular and Mediastinum   Migraine with aura - Primary    Ongoing  Not making more progress with trigger point injections  In ER frequently for treatment/headache cocktail Will consider re eval/new neuro practice  Has not had any CGRP  drugs / unsure about botox  Running out of triptan Gets rebound with analgesics Recently worsened by neck and back problems  Will ref to [facility]  Would like to see Dr Lucia Gaskins if possible   Px prednisone taper today for this headache cycle          Relevant Orders   Ambulatory referral to Neurology     Other   Thoracic back pain  Ongoing- R sided trap/scap/rhomboid Rev ortho note and plan from [facility]- toradol and PT and 6 wk f/u Rev PT note- dry needling did help and she goes again Monday Doing her exercises  Hoping for continued gradual improvement       Relevant  Medications   predniSONE (DELTASONE) 20 MG tablet

## 2023-03-07 NOTE — Progress Notes (Deleted)
    Adaria Hole T. Eriberto Felch, MD, CAQ Sports Medicine [facility] at [facility] [address] [place], [address]  Phone: [phone]  FAX: [phone]  Jennifer [name] - 67 y.o. female  MRN [id number]  Date of Birth: 08/30/1956  Date: 03/08/2023  PCP: Judy Pimple, MD  Referral: Judy Pimple, MD  No chief complaint on file.  Subjective:   Jennifer [name] is a 67 y.o. very pleasant female patient with There is no height or weight on file to calculate BMI. who presents with the following:  He is here for follow-up on back and neck pain with radiating pain as well.  She recently saw orthopedics several weeks ago, and they started her on formal physical therapy.  She also has been using topical Voltaren gel, stretching, home rehab, and a TENS unit.  She also received an IM injection of Toradol when she was at orthopedics.  She also has been using lidocaine patches.  Since her back and neck pain has been flared up, she has had recent frequent exacerbations of her migraine headaches, as well.  She has been seen at the headache center, and she has received multiple trigger point injections.  She also received trigger point injections when she was at orthopedics several weeks ago.  She has had pain from her shoulder blade all the way up into the right side of her cervical spine.  She also has had some pain in the arm and hand.    "Recent CT of cervical spine [facility] exhibits multi level spondylosis, moderate right sided foraminal narrowing on the right at C3-C4, severe left foraminal narrowing at C5-C6. No high grade spinal canal stenosis noted."  Review of Systems is noted in the HPI, as appropriate  Objective:   There were no vitals taken for this visit.  GEN: No acute distress; alert,appropriate. PULM: Breathing comfortably in no respiratory distress PSYCH: Normally interactive.   Laboratory and Imaging Data:  Assessment and Plan:   ***

## 2023-03-08 ENCOUNTER — Encounter: Payer: Self-pay | Admitting: Rehabilitative and Restorative Service Providers"

## 2023-03-08 ENCOUNTER — Ambulatory Visit: Payer: Medicare HMO | Admitting: Family Medicine

## 2023-03-08 ENCOUNTER — Ambulatory Visit (INDEPENDENT_AMBULATORY_CARE_PROVIDER_SITE_OTHER): Payer: Medicare HMO | Admitting: Rehabilitative and Restorative Service Providers"

## 2023-03-08 DIAGNOSIS — M546 Pain in thoracic spine: Secondary | ICD-10-CM

## 2023-03-08 DIAGNOSIS — G8929 Other chronic pain: Secondary | ICD-10-CM

## 2023-03-08 DIAGNOSIS — M6281 Muscle weakness (generalized): Secondary | ICD-10-CM

## 2023-03-08 DIAGNOSIS — R293 Abnormal posture: Secondary | ICD-10-CM | POA: Diagnosis not present

## 2023-03-08 DIAGNOSIS — M25511 Pain in right shoulder: Secondary | ICD-10-CM

## 2023-03-08 DIAGNOSIS — M542 Cervicalgia: Secondary | ICD-10-CM | POA: Diagnosis not present

## 2023-03-08 NOTE — Therapy (Signed)
OUTPATIENT PHYSICAL THERAPY TREATMENT   Patient Name: Jennifer [name] MRN: [id number] DOB:11-10-1956, 67 y.o., female Today's Date: 03/08/2023  END OF SESSION:  PT End of Session - 03/08/23 1150     Visit Number 3    Number of Visits 24    Date for PT Re-Evaluation 05/24/23    Authorization Type [employer] $25 copay    Authorization - Visit Number 3    Authorization - Number of Visits 12    Progress Note Due on Visit 10    PT Start Time 1147    PT Stop Time 1216    PT Time Calculation (min) 29 min    Activity Tolerance Patient tolerated treatment well    Behavior During Therapy WFL for tasks assessed/performed               Past Medical History:  Diagnosis Date   Allergic rhinitis    Allergy    seasonal   Anemia    Anxiety    Arthritis    hands, knees   Asthma    "as a child"   Blood transfusion without reported diagnosis    "as a child"   Breast cancer 2019   Right Breast Cancer   Cancer 09/2018   Breast CA new DX, right breast   Cervical dysplasia    Common bile duct dilation    Diabetes mellitus without complication    Endometriosis    Esophageal reflux    Family history of adverse reaction to anesthesia    younger sister anxiety for a dew days after   Family history of breast cancer    Family history of breast cancer    Family history of prostate cancer    Hepatic cyst 08/2020   multiple   Hepatic hemangioma 08/2020   intrahepatic hemangioma   Hiatal hernia    HSV-1 (herpes simplex virus 1) infection    Hypertension    pt.denies 10/19/22   Hypothyroid    Migraine with aura, without mention of intractable migraine without mention of status migrainosus    Osteopenia 08/2019   T score -2.2 FRAX 11% / 1.7%   Personal history of radiation therapy 2019   Right Breast Cancer   Pre-diabetes    Raynaud's disease    Past Surgical History:  Procedure Laterality Date   BIOPSY  09/12/2020   Procedure: BIOPSY;  Surgeon: Rachael Fee, MD;   Location: [facility];  Service: Endoscopy;;   BREAST BIOPSY Left 11/27/2022   MM LT BREAST BX W LOC DEV 1ST LESION IMAGE BX SPEC STEREO GUIDE 11/27/2022 [facility]   BREAST EXCISIONAL BIOPSY Bilateral over 10 years ago   benign x 2    BREAST LUMPECTOMY Right 10/10/2018   BREAST LUMPECTOMY WITH RADIOACTIVE SEED AND SENTINEL LYMPH NODE BIOPSY Right 10/10/2018   Procedure: RIGHT BREAST LUMPECTOMY WITH RADIOACTIVE SEED AND RIGHT SENTINEL LYMPH NODE BIOPSY;  Surgeon: Chevis Pretty III, MD;  Location: [facility];  Service: General;  Laterality: Right;   BREAST SURGERY     Benign breast lump excised   CERVICAL CONE BIOPSY  1985   severe dysplasia   COLONOSCOPY  2005 and dec 2019   normal   COLPOSCOPY     endoscopy  10/2018   ESOPHAGOGASTRODUODENOSCOPY (EGD) WITH PROPOFOL N/A 09/12/2020   Procedure: ESOPHAGOGASTRODUODENOSCOPY (EGD) WITH PROPOFOL;  Surgeon: Rachael Fee, MD;  Location: [facility];  Service: Endoscopy;  Laterality: N/A;   EUS N/A 09/12/2020   Procedure: UPPER ENDOSCOPIC  ULTRASOUND (EUS) RADIAL;  Surgeon: Rachael Fee, MD;  Location: [facility];  Service: Endoscopy;  Laterality: N/A;   LAPAROSCOPIC ASSISTED VAGINAL HYSTERECTOMY     LAPAROSCOPIC BILATERAL SALPINGO OOPHERECTOMY Bilateral 10/27/2019   Procedure: LAPAROSCOPIC BILATERAL SALPINGO OOPHORECTOMY WITH PERITONEAL WASHINGS;  Surgeon: Genia Del, MD;  Location: [facility] [place];  Service: Gynecology;  Laterality: Bilateral;  request 1:00pm on Friday, Dec. 4th in [place] Iqueue time held requests one hour OR time   moles removed from upper back, face lft, 1 between breasts, upper leg left inside  10/18/2019   wearing small round bandaids on for 2 weeks   ROTATOR CUFF REPAIR Bilateral 08/2008   UPPER GASTROINTESTINAL ENDOSCOPY     Patient Active Problem List   Diagnosis Date Noted   Right hand pain 01/21/2023   Right ankle pain 01/21/2023   Thoracic back pain 01/21/2023    Pain in right shoulder 08/12/2022   Hordeolum externum (stye) 08/04/2022   Raynaud's disease 03/17/2022   Left facial swelling 01/25/2022   Left ear pain 01/23/2022   Right leg pain 10/15/2021   Microcytic anemia 07/07/2021   De Quervain's tenosynovitis, right 04/18/2021   Iron deficiency anemia 09/08/2020   Common bile duct dilatation 09/06/2020   Opacity of lung on imaging study 09/06/2020   Dizziness 07/10/2020   History of breast cancer 03/01/2020   Genetic testing 11/14/2019   Bilateral hand pain 11/03/2019   Family history of prostate cancer    Adnexal cyst 09/26/2019   Family history of breast cancer    Malignant neoplasm of upper-inner quadrant of right breast in female, estrogen receptor positive 09/20/2018   B12 deficiency 04/13/2018   Joint pain 04/11/2018   Colon cancer screening 09/16/2017   Prediabetes 07/28/2016   Allergic rhinitis 11/13/2014   Screening for lipoid disorders 09/26/2014   Preventative health care 09/26/2014   Dysuria 08/07/2014   Routine general medical examination at a health care facility 10/28/2012   GERD 09/16/2010   Hypothyroidism 05/07/2008   Osteopenia 05/07/2008   Migraine with aura 11/11/2007    PCP: Judy Pimple MD  REFERRING PROVIDER: Juanda Chance, NP  REFERRING DIAG: [phone] (ICD-10-CM) - Chronic upper back pain M25.511,G89.29 (ICD-10-CM) - Chronic right shoulder pain M79.601 (ICD-10-CM) - Pain of right upper extremity M79.18 (ICD-10-CM) - Myofascial pain syndrome  THERAPY DIAG:  Cervicalgia  Pain in thoracic spine  Chronic right shoulder pain  Abnormal posture  Muscle weakness (generalized)  Rationale for Evaluation and Treatment: Rehabilitation  ONSET DATE: 01/21/2023  SUBJECTIVE:  SUBJECTIVE  STATEMENT: She indicated feeling better with mobility of shoulder and neck.  She indicated doing better with pain.  She saw MD on Friday and has been on steroid taper for headaches.  She indicated less use of muscle relaxer.  Use massage gun on area.   PERTINENT HISTORY:  Arthritis, history of Rt breast cancer, DM, osteopenia, raynauds disease.  History of bilateral shoulder surgeries.   PAIN:  NPRS scale: no specific pian upon arrival (noted headache from weekend) Pain location: Rt cervical, upper trap, back/shoulder with headaches.  Pain description: headaches, tightness, sharp pain, throbbing.  Aggravating factors: lifting, carrying, prolonged sitting, head movements Relieving factors: muscle relaxers, patches, TENS unit  PRECAUTIONS: None  WEIGHT BEARING RESTRICTIONS: No  FALLS:  Has patient fallen in last 6 months? Yes. Number of falls 1 fall No fear of falls reported.   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  Trigger points noted in Rt upper trap, levator, infraspinatus, Rt cervical paraspinals.  Concordant symptoms noted with trigger points.   CERVICAL ROM:   ROM AROM (deg) 03/01/2023 AROM 03/08/2023  Flexion 55 c stretch feel 65  Extension 42 50  Right lateral flexion    Left lateral flexion    Right rotation 48 c Rt cervical pain 65  Left  rotation 48 60   (Blank rows = not tested)  UPPER EXTREMITY MMT:  MMT Right 03/01/2023 Left 03/01/2023  Shoulder flexion 5/5 5/5  Shoulder extension    Shoulder abduction 4+/5 4+/5  Shoulder adduction    Shoulder extension    Shoulder internal rotation 5/5 5/5  Shoulder external rotation 4/5 5/5  Elbow flexion    Elbow extension    Wrist flexion    Wrist extension    Wrist ulnar deviation    Wrist radial deviation    Wrist pronation    Wrist supination     (Blank rows = not tested)  UPPER EXTREMITY ROM:  ROM Right 03/01/2023 Left 03/01/2023  Shoulder flexion    Shoulder extension    Shoulder abduction    Shoulder adduction    Shoulder extension    Shoulder internal rotation    Shoulder external rotation    Middle trapezius    Lower trapezius    Elbow flexion    Elbow extension    Wrist flexion    Wrist extension    Wrist ulnar deviation    Wrist radial deviation    Wrist pronation    Wrist supination    Grip strength     (Blank rows = not tested)  CERVICAL SPECIAL TESTS:  03/01/2023 No specific testing today due to pain levels. (Check distraction benefits in future)  FUNCTIONAL TESTS:  03/01/2023 No specific testing today   TODAY'S TREATMENT:  DATE: 03/08/2023 Manual: Compression to Rt infraspinatus, Rt upper trap  for trigger point release.    Trigger Point Dry-Needling  Treatment instructions: Expect mild to moderate muscle soreness. S/S of pneumothorax if dry needled over a lung field, and to seek immediate medical attention should they occur. Patient verbalized understanding of these instructions and education.  Patient Consent Given: Yes Education handout provided: Previously provided Muscles treated: Rt infraspinatus, Rt upper trap  Treatment response/outcome: Concordant symptoms c twitch response.  Fair to good tolerance overall.   Strong reaction noted in Rt upper trap c headache  connection   Modalities Pre mod to tolerance 10 mins in supine Rt upper trap post manual/dry needling  Therex: Review of existing HEP and addition of band resistance bilateral shoulder ER c scapular retraction.  Education given while using estim.  Trial set performed afterward.   TODAY'S TREATMENT:                                                                           DATE: 03/03/2023 Manual: Compression to Rt infraspinatus for trigger point release.  Additional time spent in manual for education of techniques and post manual /dry needling soreness response and care for home to improve.   Trigger Point Dry-Needling  Treatment instructions: Expect mild to moderate muscle soreness. S/S of pneumothorax if dry needled over a lung field, and to seek immediate medical attention should they occur. Patient verbalized understanding of these instructions and education.  Patient Consent Given: Yes Education handout provided: Previously provided Muscles treated: Rt infraspinatus Treatment response/outcome: Concordant symptoms c twitch response.  Fair to good tolerance overall.   Therex: Seated cross arm stretch Rt 15 sec x 5 Tband green rows 2 x 15  Tband gh ext green 2 x 15  UBE fwd/back 4 mins each way lvl 2.0 for ROM    PATIENT EDUCATION:  03/01/2023 Education details: HEP, POC, DN handout Person educated: Patient Education method: Explanation, Demonstration, Verbal cues, and Handouts Education comprehension: verbalized understanding, returned demonstration, and verbal cues required  HOME EXERCISE PROGRAM: Access Code: [email or web address] URL: [email or web address] Date: 03/08/2023 Prepared by: Chyrel Masson  Exercises - Seated Upper Trapezius Stretch  - 2 x daily - 7 x weekly - 1 sets - 5 reps - 15 hold - Gentle Levator Scapulae Stretch (Mirrored)  - 2 x daily - 7 x weekly - 1 sets - 5 reps - 15 hold - Standing Shoulder Posterior Capsule Stretch (Mirrored)  - 2 x daily - 7 x  weekly - 1 sets - 5 reps - 15 hold - Seated Scapular Retraction  - 3-5 x daily - 7 x weekly - 1 sets - 10 reps - 3-5 hold - Standing Bilateral Low Shoulder Row with Anchored Resistance  - 1-2 x daily - 7 x weekly - 2-3 sets - 10-15 reps - Shoulder Extension with Resistance  - 1-2 x daily - 7 x weekly - 1-2 sets - 10-15 reps - Shoulder External Rotation and Scapular Retraction with Resistance  - 1-2 x daily - 7 x weekly - 1-2 sets - 10-15 reps  ASSESSMENT:  CLINICAL IMPRESSION: Positive gains noted with manual/dry needling last visit.  Repeated same shoulder area and progressed to include Rt  upper trap c concordant symptoms in Rt cervical and Rt temporal headache region.  Due to contraction strength, estim provided today to help reduce after effect soreness.  Verbal review of HEP during estim time.  Recommend continued skilled PT services.    OBJECTIVE IMPAIRMENTS: decreased activity tolerance, decreased endurance, decreased mobility, decreased ROM, decreased strength, increased fascial restrictions, impaired perceived functional ability, increased muscle spasms, impaired flexibility, impaired UE functional use, improper body mechanics, postural dysfunction, and pain.   ACTIVITY LIMITATIONS: carrying, lifting, bending, sitting, standing, sleeping, bed mobility, reach over head, and hygiene/grooming  PARTICIPATION LIMITATIONS: meal prep, cleaning, laundry, interpersonal relationship, driving, shopping, community activity, and occupation  PERSONAL FACTORS: Time since onset of injury/illness/exacerbation , severity of symptoms, history of Arthritis, history of Rt breast cancer, DM, osteopenia, raynauds diseaseare also affecting patient's functional outcome.   REHAB POTENTIAL: Good  CLINICAL DECISION MAKING: Stable/uncomplicated  EVALUATION COMPLEXITY: Low   GOALS: Goals reviewed with patient? Yes  SHORT TERM GOALS: (target date for Short term goals are 3 weeks 03/22/2023)  1.Patient will  demonstrate independent use of home exercise program to maintain progress from in clinic treatments. Goal status: Met 03/08/2023  LONG TERM GOALS: (target dates for all long term goals are 12 weeks  05/24/2023 )   1. Patient will demonstrate/report pain at worst less than or equal to 2/10 to facilitate minimal limitation in daily activity secondary to pain symptoms. Goal status: on going 03/18/2023   2. Patient will demonstrate independent use of home exercise program to facilitate ability to maintain/progress functional gains from skilled physical therapy services. Goal status: on going 03/18/2023   3. Patient will demonstrate FOTO outcome > or = 61 % to indicate reduced disability due to condition. Goal status: on going 03/18/2023   4.  Patient will demonstrate cervical AROM WFL s symptoms to facilitate usual head movements for daily activity including driving, self care.   Goal status: on going 03/18/2023   5.  Patient will demonstrate Rt shoulder MMT 5/5 throughout s symptoms to facilitate usual daily activity at PLOF.   Goal status: on going 03/18/2023   6.  Patient will demonstrate/report ability to sleep s symptoms disruptions.  Goal status: on going 03/18/2023   7.  Patient will demonstrate/report ability to return to gym workouts for general health considerations.  Goal Status: on going 03/18/2023   PLAN:  PT FREQUENCY: 1-2x/week  PT DURATION: 12 weeks  PLANNED INTERVENTIONS: Therapeutic exercises, Therapeutic activity, Neuro Muscular re-education, Balance training, Gait training, Patient/Family education, Joint mobilization, Stair training, DME instructions, Dry Needling, Electrical stimulation, Cryotherapy, vasopneumatic device,Traction, Moist heat, Taping, Ultrasound, Ionotophoresis 4mg /ml Dexamethasone, and aquatic therapy, Manual therapy.  All included unless contraindicated  PLAN FOR NEXT SESSION: DN as desired.  Progressive scapular strengthening as tolerated.    Chyrel Masson, PT, DPT, OCS, ATC 03/08/23  12:12 PM    Referring diagnosis? M54.9,G89.29 (ICD-10-CM) - Chronic upper back pain Treatment diagnosis? (if different than referring diagnosis) M54.2 What was this (referring dx) caused by? []  Surgery [x]  Fall []  Ongoing issue []  Arthritis []  Other: ____________  Laterality: [x]  Rt []  Lt []  Both  Check all possible CPT codes:  *CHOOSE 10 OR LESS*    []  97110 (Therapeutic Exercise)  []  92507 (SLP Treatment)  []  97112 (Neuro Re-ed)   []  92526 (Swallowing Treatment)   []  63785 (Gait Training)   []  [id number] (Cognitive Training, 1st 15 minutes) []  97140 (Manual Therapy)   []  97130 (Cognitive Training, each add'l 15 minutes)  []   04540 (Re-evaluation)                              []  Other, List CPT Code ____________  []  97530 (Therapeutic Activities)     []  97535 (Self Care)   [x]  All codes above (97110 - 97535)  []  97012 (Mechanical Traction)  []  97014 (E-stim Unattended)  [x]  97032 (E-stim manual)  []  97033 (Ionto)  []  97035 (Ultrasound) [x]  97750 (Physical Performance Training) []  [id number] (Aquatic Therapy) []  97016 (Vasopneumatic Device) []  [id number] (Paraffin) []  98119 (Contrast Bath) []  97597 (Wound Care 1st 20 sq cm) []  97598 (Wound Care each add'l 20 sq cm) []  97760 (Orthotic Fabrication, Fitting, Training Initial) []  [id number] (Prosthetic Management and Training Initial) []  [id number] (Orthotic or Prosthetic Training/ Modification Subsequent)

## 2023-03-11 ENCOUNTER — Telehealth: Payer: Self-pay

## 2023-03-11 NOTE — Progress Notes (Signed)
Care Management & Coordination Services Pharmacy Team  Reason for Encounter: Initial Appointment Reminder  Contacted patient to confirm in office appointment with Al Corpus, PharmD on 03/16/2023 at 2:00.  Unsuccessful outreach. Left voicemail for patient to return call.  Chart review:  Recent office visits:  03/05/23 Roxy Manns, MD Migraine Referral to Neurology Change: Prednisone 20 mg 02/12/23 Roxy Manns, MD Chronic right sided thoracic back pain No med changes 01/21/23 Roxy Manns, MD Right hand pain No med changes 12/29/22 Roxy Manns, MD Acute bronchitis Ordered: DG Chest Change: Prednisone 10 mg  12/23/22 Roxy Manns, MD Acute cystitis with hematuria Start: Tramadol 25 mg   Recent consult visits:  03/08/23 Outpatient Rehab 03/03/23 Outpatient Rehab 02/15/23 Ellin Goodie, NP (Physical Medicine) Procedure: Small Joint Injection TORADOL 30 MG/ML injection 30 mg  Referral: PT F/U 6 weeks 02/02/23 Genia Del, MD (OBGYN) Ordered: DG Bone Performed: PAP 01/18/23 Santiago Glad (Neurology) Chronic Migraine No other information 01/12/23 Doug Sou, PA Laurette Schimke) Gastroesophageal reflux disease Change: Pantoprazole 20 mg Change: Pepcid two times daily 12/19/22 [facility] Acute cystitis with hematuria Start: Cephalexin 500 mg 12/17/22 Santiago Glad (Neurology) Chronic Migraine No other information 12/01/22 [facility] Ace Allergic rhinitis No other information 11/18/22 Upper Endoscopy 11/11/22 Sandi Mealy, MD (Derm) Seborrheic Keratosis Performed: Destruction of lesion 11/09/22 Santiago Glad (Neurology) Chronic Migraine No other information 10/19/22 Alexis Goodell, RN (Endo) Fundic gland polyps of stomach Referral: Laurette Schimke Stop (completed): Amoxicillin 875-125 mg 09/23/22 Santiago Glad (Neurology) Chronic migraine No other information  Hospital visits:  Medication Reconciliation was completed by comparing discharge summary, patient's EMR and  Pharmacy list, and upon discussion with patient.  Admitted to the ED on 02/13/23 due to Migraine. Discharge date was 02/13/23. Discharged from [facility].    Medications that remain the same after Hospital Discharge:??  -All other medications will remain the same.     Medication Reconciliation was completed by comparing discharge summary, patient's EMR and Pharmacy list, and upon discussion with patient.  Admitted to the hospital on 01/25/23 due to Migraine. Discharge date was 01/25/23. Discharged from [facility].    New?Medications Started at [facility] Discharge:?? lidocaine 5 % patch  methocarbamol 500 mg tablet    Medications that remain the same after Hospital Discharge:??  -All other medications will remain the same.     Medication Reconciliation was completed by comparing discharge summary, patient's EMR and Pharmacy list, and upon discussion with patient.  Admitted to the ED on 01/09/23 due to Migraine without status migrainosus. Discharge date was 01/09/23. Discharged from [facility].    Medications that remain the same after Hospital Discharge:??  -All other medications will remain the same.     Medication Reconciliation was completed by comparing discharge summary, patient's EMR and Pharmacy list, and upon discussion with patient.  Admitted to the ED on 12/24/22 due to Complicated migraine. Discharge date was 12/24/22. Discharged from [facility].    Medication Changes at Hospital Discharge: Prednisone 20 mg   Medications that remain the same after Hospital Discharge:??  -All other medications will remain the same.     Medication Reconciliation was completed by comparing discharge summary, patient's EMR and Pharmacy list, and upon discussion with patient.  Admitted to the ED on 10/12/22 due to Migraine. Discharge date was 10/12/22. Discharged from [facility].    Medication Changes at  Hospital Discharge: Prednisone 60 mg  Medications that remain the same after Hospital Discharge:??  -All other medications will remain the same.  Star Rating Drugs:  Medication:  Last Fill: Day Supply Metformin 500 mg 12/08/2022 90  Care Gaps: Annual wellness visit in last year? No  If Diabetic: Last eye exam / retinopathy screening: Up to date Last diabetic foot exam: Up to date  Al Corpus, PharmD notified  Claudina Lick, [place] Clinical Pharmacy Assistant [phone]

## 2023-03-12 ENCOUNTER — Encounter: Payer: Self-pay | Admitting: Rehabilitative and Restorative Service Providers"

## 2023-03-12 ENCOUNTER — Ambulatory Visit: Payer: Medicare HMO | Admitting: Rehabilitative and Restorative Service Providers"

## 2023-03-12 DIAGNOSIS — M542 Cervicalgia: Secondary | ICD-10-CM

## 2023-03-12 DIAGNOSIS — R293 Abnormal posture: Secondary | ICD-10-CM | POA: Diagnosis not present

## 2023-03-12 DIAGNOSIS — M546 Pain in thoracic spine: Secondary | ICD-10-CM | POA: Diagnosis not present

## 2023-03-12 DIAGNOSIS — G8929 Other chronic pain: Secondary | ICD-10-CM | POA: Diagnosis not present

## 2023-03-12 DIAGNOSIS — M25511 Pain in right shoulder: Secondary | ICD-10-CM | POA: Diagnosis not present

## 2023-03-12 DIAGNOSIS — M6281 Muscle weakness (generalized): Secondary | ICD-10-CM | POA: Diagnosis not present

## 2023-03-12 NOTE — Therapy (Signed)
OUTPATIENT PHYSICAL THERAPY TREATMENT   Patient Name: Jennifer [name] MRN: [id number] DOB:[birth year], 67 y.o., female Today's Date: 03/12/2023  END OF SESSION:  PT End of Session - 03/12/23 1152     Visit Number 4    Number of Visits 24    Date for PT Re-Evaluation 05/24/23    Authorization Type [employer] $25 copay    Authorization - Visit Number 4    Authorization - Number of Visits 12    Progress Note Due on Visit 10    PT Start Time 1149    PT Stop Time 1224    PT Time Calculation (min) 35 min    Activity Tolerance Patient tolerated treatment well    Behavior During Therapy WFL for tasks assessed/performed                Past Medical History:  Diagnosis Date   Allergic rhinitis    Allergy    seasonal   Anemia    Anxiety    Arthritis    hands, knees   Asthma    "as a child"   Blood transfusion without reported diagnosis    "as a child"   Breast cancer 2019   Right Breast Cancer   Cancer 09/2018   Breast CA new DX, right breast   Cervical dysplasia    Common bile duct dilation    Diabetes mellitus without complication    Endometriosis    Esophageal reflux    Family history of adverse reaction to anesthesia    younger sister anxiety for a dew days after   Family history of breast cancer    Family history of breast cancer    Family history of prostate cancer    Hepatic cyst 08/2020   multiple   Hepatic hemangioma 08/2020   intrahepatic hemangioma   Hiatal hernia    HSV-1 (herpes simplex virus 1) infection    Hypertension    pt.denies 10/19/22   Hypothyroid    Migraine with aura, without mention of intractable migraine without mention of status migrainosus    Osteopenia 08/2019   T score -2.2 FRAX 11% / 1.7%   Personal history of radiation therapy 2019   Right Breast Cancer   Pre-diabetes    Raynaud's disease    Past Surgical History:  Procedure Laterality Date   BIOPSY  09/12/2020   Procedure: BIOPSY;  Surgeon: Rachael Fee, MD;   Location: [facility];  Service: Endoscopy;;   BREAST BIOPSY Left 11/27/2022   MM LT BREAST BX W LOC DEV 1ST LESION IMAGE BX SPEC STEREO GUIDE 11/27/2022 [facility]   BREAST EXCISIONAL BIOPSY Bilateral over 10 years ago   benign x 2    BREAST LUMPECTOMY Right 10/10/2018   BREAST LUMPECTOMY WITH RADIOACTIVE SEED AND SENTINEL LYMPH NODE BIOPSY Right 10/10/2018   Procedure: RIGHT BREAST LUMPECTOMY WITH RADIOACTIVE SEED AND RIGHT SENTINEL LYMPH NODE BIOPSY;  Surgeon: Chevis Pretty III, MD;  Location: [facility];  Service: General;  Laterality: Right;   BREAST SURGERY     Benign breast lump excised   CERVICAL CONE BIOPSY  1985   severe dysplasia   COLONOSCOPY  2005 and dec 2019   normal   COLPOSCOPY     endoscopy  10/2018   ESOPHAGOGASTRODUODENOSCOPY (EGD) WITH PROPOFOL N/A 09/12/2020   Procedure: ESOPHAGOGASTRODUODENOSCOPY (EGD) WITH PROPOFOL;  Surgeon: Rachael Fee, MD;  Location: [facility];  Service: Endoscopy;  Laterality: N/A;   EUS N/A 09/12/2020   Procedure: UPPER  ENDOSCOPIC ULTRASOUND (EUS) RADIAL;  Surgeon: Rachael Fee, MD;  Location: [facility];  Service: Endoscopy;  Laterality: N/A;   LAPAROSCOPIC ASSISTED VAGINAL HYSTERECTOMY     LAPAROSCOPIC BILATERAL SALPINGO OOPHERECTOMY Bilateral 10/27/2019   Procedure: LAPAROSCOPIC BILATERAL SALPINGO OOPHORECTOMY WITH PERITONEAL WASHINGS;  Surgeon: Genia Del, MD;  Location: [facility] [place];  Service: Gynecology;  Laterality: Bilateral;  request 1:00pm on Friday, Dec. 4th in [place] Iqueue time held requests one hour OR time   moles removed from upper back, face lft, 1 between breasts, upper leg left inside  10/18/2019   wearing small round bandaids on for 2 weeks   ROTATOR CUFF REPAIR Bilateral 08/2008   UPPER GASTROINTESTINAL ENDOSCOPY     Patient Active Problem List   Diagnosis Date Noted   Right hand pain 01/21/2023   Right ankle pain 01/21/2023   Thoracic back pain 01/21/2023    Pain in right shoulder 08/12/2022   Hordeolum externum (stye) 08/04/2022   Raynaud's disease 03/17/2022   Left facial swelling 01/25/2022   Left ear pain 01/23/2022   Right leg pain 10/15/2021   Microcytic anemia 07/07/2021   De Quervain's tenosynovitis, right 04/18/2021   Iron deficiency anemia 09/08/2020   Common bile duct dilatation 09/06/2020   Opacity of lung on imaging study 09/06/2020   Dizziness 07/10/2020   History of breast cancer 03/01/2020   Genetic testing 11/14/2019   Bilateral hand pain 11/03/2019   Family history of prostate cancer    Adnexal cyst 09/26/2019   Family history of breast cancer    Malignant neoplasm of upper-inner quadrant of right breast in female, estrogen receptor positive 09/20/2018   B12 deficiency 04/13/2018   Joint pain 04/11/2018   Colon cancer screening 09/16/2017   Prediabetes 07/28/2016   Allergic rhinitis 11/13/2014   Screening for lipoid disorders 09/26/2014   Preventative health care 09/26/2014   Dysuria 08/07/2014   Routine general medical examination at a health care facility 10/28/2012   GERD 09/16/2010   Hypothyroidism 05/07/2008   Osteopenia 05/07/2008   Migraine with aura 11/11/2007    PCP: Judy Pimple MD  REFERRING PROVIDER: Juanda Chance, NP  REFERRING DIAG: [phone] (ICD-10-CM) - Chronic upper back pain M25.511,G89.29 (ICD-10-CM) - Chronic right shoulder pain M79.601 (ICD-10-CM) - Pain of right upper extremity M79.18 (ICD-10-CM) - Myofascial pain syndrome  THERAPY DIAG:  Cervicalgia  Pain in thoracic spine  Chronic right shoulder pain  Abnormal posture  Muscle weakness (generalized)  Rationale for Evaluation and Treatment: Rehabilitation  ONSET DATE: 01/21/2023  SUBJECTIVE:  SUBJECTIVE  STATEMENT: She reported having a little pain and a migraine this week but nothing increased after last visit.  She did report a short period of time to recover in car prior to leaving after last visit.  Reported Rt shoulder pain more today than last time.     PERTINENT HISTORY:  Arthritis, history of Rt breast cancer, DM, osteopenia, raynauds disease.  History of bilateral shoulder surgeries.   PAIN:  NPRS scale: Rt shoulder 2/10 Pain location: Rt shoulder, neck Pain description: headaches, tightness, sharp pain, throbbing.  Aggravating factors: lifting, carrying, prolonged sitting, head movements Relieving factors: muscle relaxers, patches, TENS unit  PRECAUTIONS: None  WEIGHT BEARING RESTRICTIONS: No  FALLS:  Has patient fallen in last 6 months? Yes. Number of falls 1 fall No fear of falls reported.   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   03/01/2023 Increased sensitivity to light touch in Rt cervical, upper trap, posterior shoulder.  Trigger points noted in Rt upper trap, levator, infraspinatus, Rt cervical paraspinals.  Concordant symptoms noted with trigger points.   CERVICAL ROM:   ROM AROM (deg) 03/01/2023 AROM 03/08/2023  Flexion 55 c stretch feel 65  Extension 42 50  Right lateral flexion    Left lateral flexion    Right rotation 48 c Rt cervical pain 65  Left rotation 48 60    (Blank rows = not tested)  UPPER EXTREMITY MMT:  MMT Right 03/01/2023 Left 03/01/2023  Shoulder flexion 5/5 5/5  Shoulder extension    Shoulder abduction 4+/5 4+/5  Shoulder adduction    Shoulder extension    Shoulder internal rotation 5/5 5/5  Shoulder external rotation 4/5 5/5  Elbow flexion    Elbow extension    Wrist flexion    Wrist extension    Wrist ulnar deviation    Wrist radial deviation    Wrist pronation    Wrist supination     (Blank rows = not tested)  UPPER EXTREMITY ROM:  ROM Right 03/01/2023 Left 03/01/2023  Shoulder flexion    Shoulder extension    Shoulder abduction    Shoulder adduction    Shoulder extension    Shoulder internal rotation    Shoulder external rotation    Middle trapezius    Lower trapezius    Elbow flexion    Elbow extension    Wrist flexion    Wrist extension    Wrist ulnar deviation    Wrist radial deviation    Wrist pronation    Wrist supination    Grip strength     (Blank rows = not tested)  CERVICAL SPECIAL TESTS:  03/01/2023 No specific testing today due to pain levels. (Check distraction benefits in future)  FUNCTIONAL TESTS:  03/01/2023 No specific testing today   TODAY'S TREATMENT:  DATE: 03/12/2023 Manual: Compression to Rt infraspinatus, Rt upper trap  for trigger point release.  Prone cPA , regional PA mid thoracic region g3.   Trigger Point Dry-Needling  Treatment instructions: Expect mild to moderate muscle soreness. S/S of pneumothorax if dry needled over a lung field, and to seek immediate medical attention should they occur. Patient verbalized understanding of these instructions and education.  Patient Consent Given: Yes Education handout provided: Previously provided Muscles treated: Rt infraspinatus, Rt upper trap  Treatment response/outcome: Concordant symptoms c twitch response.  Good tolerance overall (reduced reaction by nervous  symptom today)   Modalities IFC to tolerance 10 mins in sitting Rt upper/trap posterior scapular region  Therex: Standing green band rows 2 x 15 Standing green band GH ext 2 x 15 Seated green band ER c scapular retraction x 15  TODAY'S TREATMENT:                                                                           DATE: 03/08/2023 Manual: Compression to Rt infraspinatus, Rt upper trap  for trigger point release.    Trigger Point Dry-Needling  Treatment instructions: Expect mild to moderate muscle soreness. S/S of pneumothorax if dry needled over a lung field, and to seek immediate medical attention should they occur. Patient verbalized understanding of these instructions and education.  Patient Consent Given: Yes Education handout provided: Previously provided Muscles treated: Rt infraspinatus, Rt upper trap  Treatment response/outcome: Concordant symptoms c twitch response.  Fair to good tolerance overall.   Strong reaction noted in Rt upper trap c headache connection   Modalities Pre mod to tolerance 10 mins in supine Rt upper trap post manual/dry needling  Therex: Review of existing HEP and addition of band resistance bilateral shoulder ER c scapular retraction.  Education given while using estim.  Trial set performed afterward.   TODAY'S TREATMENT:                                                                           DATE: 03/03/2023 Manual: Compression to Rt infraspinatus for trigger point release.  Additional time spent in manual for education of techniques and post manual /dry needling soreness response and care for home to improve.   Trigger Point Dry-Needling  Treatment instructions: Expect mild to moderate muscle soreness. S/S of pneumothorax if dry needled over a lung field, and to seek immediate medical attention should they occur. Patient verbalized understanding of these instructions and education.  Patient Consent Given: Yes Education handout provided:  Previously provided Muscles treated: Rt infraspinatus Treatment response/outcome: Concordant symptoms c twitch response.  Fair to good tolerance overall.   Therex: Seated cross arm stretch Rt 15 sec x 5 Tband green rows 2 x 15  Tband gh ext green 2 x 15  UBE fwd/back 4 mins each way lvl 2.0 for ROM    PATIENT EDUCATION:  03/01/2023 Education details: HEP, POC, DN handout Person educated: Patient Education  method: Explanation, Demonstration, Verbal cues, and Handouts Education comprehension: verbalized understanding, returned demonstration, and verbal cues required  HOME EXERCISE PROGRAM: Access Code: [email or web address] URL: [email or web address] Date: 03/08/2023 Prepared by: Chyrel Masson  Exercises - Seated Upper Trapezius Stretch  - 2 x daily - 7 x weekly - 1 sets - 5 reps - 15 hold - Gentle Levator Scapulae Stretch (Mirrored)  - 2 x daily - 7 x weekly - 1 sets - 5 reps - 15 hold - Standing Shoulder Posterior Capsule Stretch (Mirrored)  - 2 x daily - 7 x weekly - 1 sets - 5 reps - 15 hold - Seated Scapular Retraction  - 3-5 x daily - 7 x weekly - 1 sets - 10 reps - 3-5 hold - Standing Bilateral Low Shoulder Row with Anchored Resistance  - 1-2 x daily - 7 x weekly - 2-3 sets - 10-15 reps - Shoulder Extension with Resistance  - 1-2 x daily - 7 x weekly - 1-2 sets - 10-15 reps - Shoulder External Rotation and Scapular Retraction with Resistance  - 1-2 x daily - 7 x weekly - 1-2 sets - 10-15 reps  ASSESSMENT:  CLINICAL IMPRESSION: Pt continue to report and indicate positive gains with use of needling and manual techniques for pain reduction.  Reduced headache complaint frequency noted in last week or so with combination of treatment and medication.      OBJECTIVE IMPAIRMENTS: decreased activity tolerance, decreased endurance, decreased mobility, decreased ROM, decreased strength, increased fascial restrictions, impaired perceived functional ability, increased muscle spasms,  impaired flexibility, impaired UE functional use, improper body mechanics, postural dysfunction, and pain.   ACTIVITY LIMITATIONS: carrying, lifting, bending, sitting, standing, sleeping, bed mobility, reach over head, and hygiene/grooming  PARTICIPATION LIMITATIONS: meal prep, cleaning, laundry, interpersonal relationship, driving, shopping, community activity, and occupation  PERSONAL FACTORS: Time since onset of injury/illness/exacerbation , severity of symptoms, history of Arthritis, history of Rt breast cancer, DM, osteopenia, raynauds diseaseare also affecting patient's functional outcome.   REHAB POTENTIAL: Good  CLINICAL DECISION MAKING: Stable/uncomplicated  EVALUATION COMPLEXITY: Low   GOALS: Goals reviewed with patient? Yes  SHORT TERM GOALS: (target date for Short term goals are 3 weeks 03/22/2023)  1.Patient will demonstrate independent use of home exercise program to maintain progress from in clinic treatments. Goal status: Met 03/08/2023  LONG TERM GOALS: (target dates for all long term goals are 12 weeks  05/24/2023 )   1. Patient will demonstrate/report pain at worst less than or equal to 2/10 to facilitate minimal limitation in daily activity secondary to pain symptoms. Goal status: on going 03/18/2023   2. Patient will demonstrate independent use of home exercise program to facilitate ability to maintain/progress functional gains from skilled physical therapy services. Goal status: on going 03/18/2023   3. Patient will demonstrate FOTO outcome > or = 61 % to indicate reduced disability due to condition. Goal status: on going 03/18/2023   4.  Patient will demonstrate cervical AROM WFL s symptoms to facilitate usual head movements for daily activity including driving, self care.   Goal status: on going 03/18/2023   5.  Patient will demonstrate Rt shoulder MMT 5/5 throughout s symptoms to facilitate usual daily activity at PLOF.   Goal status: on going 03/18/2023   6.   Patient will demonstrate/report ability to sleep s symptoms disruptions.  Goal status: on going 03/18/2023   7.  Patient will demonstrate/report ability to return to gym workouts for general health considerations.  Goal Status: on going 03/18/2023  PLAN:  PT FREQUENCY: 1-2x/week  PT DURATION: 12 weeks  PLANNED INTERVENTIONS: Therapeutic exercises, Therapeutic activity, Neuro Muscular re-education, Balance training, Gait training, Patient/Family education, Joint mobilization, Stair training, DME instructions, Dry Needling, Electrical stimulation, Cryotherapy, vasopneumatic device,Traction, Moist heat, Taping, Ultrasound, Ionotophoresis /ml Dexamethasone, and aquatic therapy, Manual therapy.  All included unless contraindicated  PLAN FOR NEXT SESSION: Use of DN and manual trigger point release.  Progressive scapular strengthening as tolerated.    Chyrel Masson, PT, DPT, OCS, ATC 03/12/23  12:21 PM    Referring diagnosis? M54.9,G89.29 (ICD-10-CM) - Chronic upper back pain Treatment diagnosis? (if different than referring diagnosis) M54.2 What was this (referring dx) caused by?  Surgery  Fall  Ongoing issue  Arthritis  Other: ____________  Laterality:  Rt  Lt  Both  Check all possible CPT codes:  *CHOOSE 10 OR LESS*     97110 (Therapeutic Exercise)   92507 (SLP Treatment)   97112 (Neuro Re-ed)    92526 (Swallowing Treatment)    16109 (Gait Training)    [id number] (Cognitive Training, 1st 15 minutes)  97140 (Manual Therapy)    97130 (Cognitive Training, each add'l 15 minutes)   97164 (Re-evaluation)                               Other, List CPT Code ____________   97530 (Therapeutic Activities)      97535 (Self Care)    All codes above (97110 - 97535)   97012 (Mechanical Traction)   97014 (E-stim Unattended)   97032 (E-stim manual)   97033 (Ionto)   97035 (Ultrasound)  97750 (Physical Performance Training)   [id number] (Aquatic Therapy)  97016 (Vasopneumatic Device)  [id number] (Paraffin)  97034 (Contrast Bath)  97597 (Wound Care 1st 20 sq cm)  97598 (Wound Care each add'l 20 sq cm)  97760 (Orthotic Fabrication, Fitting, Training Initial)  [id number] (Prosthetic Management and Training Initial)  [id number] (Orthotic or Prosthetic Training/ Modification Subsequent)

## 2023-03-15 ENCOUNTER — Encounter: Payer: Self-pay | Admitting: Rehabilitative and Restorative Service Providers"

## 2023-03-15 ENCOUNTER — Encounter: Payer: Medicare HMO | Admitting: Pharmacist

## 2023-03-15 ENCOUNTER — Ambulatory Visit: Payer: Medicare HMO | Admitting: Rehabilitative and Restorative Service Providers"

## 2023-03-15 DIAGNOSIS — M25511 Pain in right shoulder: Secondary | ICD-10-CM

## 2023-03-15 DIAGNOSIS — M546 Pain in thoracic spine: Secondary | ICD-10-CM

## 2023-03-15 DIAGNOSIS — M6281 Muscle weakness (generalized): Secondary | ICD-10-CM

## 2023-03-15 DIAGNOSIS — G8929 Other chronic pain: Secondary | ICD-10-CM | POA: Diagnosis not present

## 2023-03-15 DIAGNOSIS — R293 Abnormal posture: Secondary | ICD-10-CM

## 2023-03-15 DIAGNOSIS — M542 Cervicalgia: Secondary | ICD-10-CM | POA: Diagnosis not present

## 2023-03-15 NOTE — Therapy (Signed)
OUTPATIENT PHYSICAL THERAPY TREATMENT   Patient Name: Jennifer [name] MRN: [id number] DOB:[birth year], 67 y.o., female Today's Date: 03/15/2023  END OF SESSION:  PT End of Session - 03/15/23 1059     Visit Number 5    Number of Visits 24    Date for PT Re-Evaluation 05/24/23    Authorization Type [employer] $25 copay    Authorization - Visit Number 5    Authorization - Number of Visits 12    Progress Note Due on Visit 10    PT Start Time 1059    PT Stop Time 1140    PT Time Calculation (min) 41 min    Activity Tolerance Patient tolerated treatment well    Behavior During Therapy WFL for tasks assessed/performed                 Past Medical History:  Diagnosis Date   Allergic rhinitis    Allergy    seasonal   Anemia    Anxiety    Arthritis    hands, knees   Asthma    "as a child"   Blood transfusion without reported diagnosis    "as a child"   Breast cancer 2019   Right Breast Cancer   Cancer 09/2018   Breast CA new DX, right breast   Cervical dysplasia    Common bile duct dilation    Diabetes mellitus without complication    Endometriosis    Esophageal reflux    Family history of adverse reaction to anesthesia    younger sister anxiety for a dew days after   Family history of breast cancer    Family history of breast cancer    Family history of prostate cancer    Hepatic cyst 08/2020   multiple   Hepatic hemangioma 08/2020   intrahepatic hemangioma   Hiatal hernia    HSV-1 (herpes simplex virus 1) infection    Hypertension    pt.denies 10/19/22   Hypothyroid    Migraine with aura, without mention of intractable migraine without mention of status migrainosus    Osteopenia 08/2019   T score -2.2 FRAX 11% / 1.7%   Personal history of radiation therapy 2019   Right Breast Cancer   Pre-diabetes    Raynaud's disease    Past Surgical History:  Procedure Laterality Date   BIOPSY  09/12/2020   Procedure: BIOPSY;  Surgeon: Rachael Fee, MD;   Location: [facility];  Service: Endoscopy;;   BREAST BIOPSY Left 11/27/2022   MM LT BREAST BX W LOC DEV 1ST LESION IMAGE BX SPEC STEREO GUIDE 11/27/2022 [facility]   BREAST EXCISIONAL BIOPSY Bilateral over 10 years ago   benign x 2    BREAST LUMPECTOMY Right 10/10/2018   BREAST LUMPECTOMY WITH RADIOACTIVE SEED AND SENTINEL LYMPH NODE BIOPSY Right 10/10/2018   Procedure: RIGHT BREAST LUMPECTOMY WITH RADIOACTIVE SEED AND RIGHT SENTINEL LYMPH NODE BIOPSY;  Surgeon: Chevis Pretty III, MD;  Location: [facility];  Service: General;  Laterality: Right;   BREAST SURGERY     Benign breast lump excised   CERVICAL CONE BIOPSY  1985   severe dysplasia   COLONOSCOPY  2005 and dec 2019   normal   COLPOSCOPY     endoscopy  10/2018   ESOPHAGOGASTRODUODENOSCOPY (EGD) WITH PROPOFOL N/A 09/12/2020   Procedure: ESOPHAGOGASTRODUODENOSCOPY (EGD) WITH PROPOFOL;  Surgeon: Rachael Fee, MD;  Location: [facility];  Service: Endoscopy;  Laterality: N/A;   EUS N/A 09/12/2020   Procedure:  UPPER ENDOSCOPIC ULTRASOUND (EUS) RADIAL;  Surgeon: Rachael Fee, MD;  Location: [facility];  Service: Endoscopy;  Laterality: N/A;   LAPAROSCOPIC ASSISTED VAGINAL HYSTERECTOMY     LAPAROSCOPIC BILATERAL SALPINGO OOPHERECTOMY Bilateral 10/27/2019   Procedure: LAPAROSCOPIC BILATERAL SALPINGO OOPHORECTOMY WITH PERITONEAL WASHINGS;  Surgeon: Genia Del, MD;  Location: [facility];  Service: Gynecology;  Laterality: Bilateral;  request 1:00pm on Friday, Dec. 4th in [place] Iqueue time held requests one hour OR time   moles removed from upper back, face lft, 1 between breasts, upper leg left inside  10/18/2019   wearing small round bandaids on for 2 weeks   ROTATOR CUFF REPAIR Bilateral 08/2008   UPPER GASTROINTESTINAL ENDOSCOPY     Patient Active Problem List   Diagnosis Date Noted   Right hand pain 01/21/2023   Right ankle pain 01/21/2023   Thoracic back pain 01/21/2023    Pain in right shoulder 08/12/2022   Hordeolum externum (stye) 08/04/2022   Raynaud's disease 03/17/2022   Left facial swelling 01/25/2022   Left ear pain 01/23/2022   Right leg pain 10/15/2021   Microcytic anemia 07/07/2021   De Quervain's tenosynovitis, right 04/18/2021   Iron deficiency anemia 09/08/2020   Common bile duct dilatation 09/06/2020   Opacity of lung on imaging study 09/06/2020   Dizziness 07/10/2020   History of breast cancer 03/01/2020   Genetic testing 11/14/2019   Bilateral hand pain 11/03/2019   Family history of prostate cancer    Adnexal cyst 09/26/2019   Family history of breast cancer    Malignant neoplasm of upper-inner quadrant of right breast in female, estrogen receptor positive 09/20/2018   B12 deficiency 04/13/2018   Joint pain 04/11/2018   Colon cancer screening 09/16/2017   Prediabetes 07/28/2016   Allergic rhinitis 11/13/2014   Screening for lipoid disorders 09/26/2014   Preventative health care 09/26/2014   Dysuria 08/07/2014   Routine general medical examination at a health care facility 10/28/2012   GERD 09/16/2010   Hypothyroidism 05/07/2008   Osteopenia 05/07/2008   Migraine with aura 11/11/2007    PCP: Judy Pimple MD  REFERRING PROVIDER: Juanda Chance, NP  REFERRING DIAG: [phone] (ICD-10-CM) - Chronic upper back pain M25.511,G89.29 (ICD-10-CM) - Chronic right shoulder pain M79.601 (ICD-10-CM) - Pain of right upper extremity M79.18 (ICD-10-CM) - Myofascial pain syndrome  THERAPY DIAG:  Cervicalgia  Pain in thoracic spine  Chronic right shoulder pain  Abnormal posture  Muscle weakness (generalized)  Rationale for Evaluation and Treatment: Rehabilitation  ONSET DATE: 01/21/2023  SUBJECTIVE:  SUBJECTIVE  STATEMENT: She indicated having migraine on Friday/Saturday but good other days since last visit.  Reported some back of shoulder complaints here and there but not constant.    PERTINENT HISTORY:  Arthritis, history of Rt breast cancer, DM, osteopenia, raynauds disease.  History of bilateral shoulder surgeries.   PAIN:  NPRS scale: Rt shoulder - 2/10.  Pain location: Rt shoulder, neck Pain description: headaches, tightness, sharp pain, throbbing.  Aggravating factors: intermittent, sometimes with movement.  Relieving factors: muscle relaxers, patches, TENS unit  PRECAUTIONS: None  WEIGHT BEARING RESTRICTIONS: No  FALLS:  Has patient fallen in last 6 months? Yes. Number of falls 1 fall No fear of falls reported.   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   03/01/2023 Increased sensitivity to light touch in Rt cervical, upper trap, posterior shoulder.  Trigger points noted in Rt upper trap, levator, infraspinatus, Rt cervical paraspinals.  Concordant symptoms noted with trigger points.   CERVICAL ROM:   ROM AROM (deg) 03/01/2023 AROM 03/08/2023  Flexion 55 c stretch feel 65  Extension 42 50  Right lateral flexion    Left lateral flexion    Right rotation 48 c Rt cervical pain 65  Left rotation 48 60   (Blank rows = not tested)  UPPER EXTREMITY MMT:  MMT Right 03/01/2023 Left 03/01/2023   Shoulder flexion 5/5 5/5  Shoulder extension    Shoulder abduction 4+/5 4+/5  Shoulder adduction    Shoulder extension    Shoulder internal rotation 5/5 5/5  Shoulder external rotation 4/5 5/5  Elbow flexion    Elbow extension    Wrist flexion    Wrist extension    Wrist ulnar deviation    Wrist radial deviation    Wrist pronation    Wrist supination     (Blank rows = not tested)  UPPER EXTREMITY ROM:  ROM Right 03/01/2023 Left 03/01/2023  Shoulder flexion    Shoulder extension    Shoulder abduction    Shoulder adduction    Shoulder extension    Shoulder internal rotation    Shoulder external rotation    Middle trapezius    Lower trapezius    Elbow flexion    Elbow extension    Wrist flexion    Wrist extension    Wrist ulnar deviation    Wrist radial deviation    Wrist pronation    Wrist supination    Grip strength     (Blank rows = not tested)  CERVICAL SPECIAL TESTS:  03/01/2023 No specific testing today due to pain levels. (Check distraction benefits in future)  FUNCTIONAL TESTS:  03/01/2023 No specific testing today   TODAY'S TREATMENT:  DATE: 03/15/2023 Therex: UBE UE only 3 mins fwd/back each way lvl 2.5 fwd, 1.5 backward Prone scapular retraction 5 sec hold x 10 Prone scapular retraction c GH ext 5 sec hold x 10 Prone scapular retraction c horizontal abduction 5 sec hold x 10 Seated Rt upper trap stretch 15 sec x 5 Seated Rt shoulder cross arm stretch 15 sec x 5  Manual: Compression to Rt infraspinatus, Rt upper trap  for trigger point release.  Prone cPA , regional PA mid thoracic region g3.   Trigger Point Dry-Needling  Treatment instructions: Expect mild to moderate muscle soreness. S/S of pneumothorax if dry needled over a lung field, and to seek immediate medical attention should they occur. Patient verbalized understanding of these instructions and education.  Patient  Consent Given: Yes Education handout provided: Previously provided Muscles treated: Rt infraspinatus, Rt upper trap  Treatment response/outcome: Concordant symptoms c twitch response.  Good tolerance overall   Modalities Pre mod to Rt upper trap, Rt shoulder (anterior/posterior pad placement during self stretching)   TODAY'S TREATMENT:                                                                           DATE: 03/12/2023 Manual: Compression to Rt infraspinatus, Rt upper trap  for trigger point release.  Prone cPA , regional PA mid thoracic region g3.   Trigger Point Dry-Needling  Treatment instructions: Expect mild to moderate muscle soreness. S/S of pneumothorax if dry needled over a lung field, and to seek immediate medical attention should they occur. Patient verbalized understanding of these instructions and education.  Patient Consent Given: Yes Education handout provided: Previously provided Muscles treated: Rt infraspinatus, Rt upper trap  Treatment response/outcome: Concordant symptoms c twitch response.  Good tolerance overall (reduced reaction by nervous symptom today)   Modalities IFC to tolerance 10 mins in sitting Rt upper/trap posterior scapular region  Therex: Standing green band rows 2 x 15 Standing green band GH ext 2 x 15 Seated green band ER c scapular retraction x 15  TODAY'S TREATMENT:                                                                           DATE: 03/08/2023 Manual: Compression to Rt infraspinatus, Rt upper trap  for trigger point release.    Trigger Point Dry-Needling  Treatment instructions: Expect mild to moderate muscle soreness. S/S of pneumothorax if dry needled over a lung field, and to seek immediate medical attention should they occur. Patient verbalized understanding of these instructions and education.  Patient Consent Given: Yes Education handout provided: Previously provided Muscles treated: Rt infraspinatus, Rt upper trap   Treatment response/outcome: Concordant symptoms c twitch response.  Fair to good tolerance overall.   Strong reaction noted in Rt upper trap c headache connection   Modalities Pre mod to tolerance 10 mins in supine Rt upper trap post manual/dry needling  Therex: Review of existing HEP and addition  of band resistance bilateral shoulder ER c scapular retraction.  Education given while using estim.  Trial set performed afterward.    PATIENT EDUCATION:  03/01/2023 Education details: HEP, POC, DN handout Person educated: Patient Education method: Explanation, Demonstration, Verbal cues, and Handouts Education comprehension: verbalized understanding, returned demonstration, and verbal cues required  HOME EXERCISE PROGRAM: Access Code: [email or web address] URL: [email or web address] Date: 03/08/2023 Prepared by: Chyrel Masson  Exercises - Seated Upper Trapezius Stretch  - 2 x daily - 7 x weekly - 1 sets - 5 reps - 15 hold - Gentle Levator Scapulae Stretch (Mirrored)  - 2 x daily - 7 x weekly - 1 sets - 5 reps - 15 hold - Standing Shoulder Posterior Capsule Stretch (Mirrored)  - 2 x daily - 7 x weekly - 1 sets - 5 reps - 15 hold - Seated Scapular Retraction  - 3-5 x daily - 7 x weekly - 1 sets - 10 reps - 3-5 hold - Standing Bilateral Low Shoulder Row with Anchored Resistance  - 1-2 x daily - 7 x weekly - 2-3 sets - 10-15 reps - Shoulder Extension with Resistance  - 1-2 x daily - 7 x weekly - 1-2 sets - 10-15 reps - Shoulder External Rotation and Scapular Retraction with Resistance  - 1-2 x daily - 7 x weekly - 1-2 sets - 10-15 reps  ASSESSMENT:  CLINICAL IMPRESSION: Progressed to include increased scapular strengthening with good overall results.  Reducing number of trigger points in Rt upper trap and shoulder as compared to previous due to improvements overall.  Continued skilled PT services with progressive strengthening as able to help progress towards goals.    OBJECTIVE  IMPAIRMENTS: decreased activity tolerance, decreased endurance, decreased mobility, decreased ROM, decreased strength, increased fascial restrictions, impaired perceived functional ability, increased muscle spasms, impaired flexibility, impaired UE functional use, improper body mechanics, postural dysfunction, and pain.   ACTIVITY LIMITATIONS: carrying, lifting, bending, sitting, standing, sleeping, bed mobility, reach over head, and hygiene/grooming  PARTICIPATION LIMITATIONS: meal prep, cleaning, laundry, interpersonal relationship, driving, shopping, community activity, and occupation  PERSONAL FACTORS: Time since onset of injury/illness/exacerbation , severity of symptoms, history of Arthritis, history of Rt breast cancer, DM, osteopenia, raynauds diseaseare also affecting patient's functional outcome.   REHAB POTENTIAL: Good  CLINICAL DECISION MAKING: Stable/uncomplicated  EVALUATION COMPLEXITY: Low   GOALS: Goals reviewed with patient? Yes  SHORT TERM GOALS: (target date for Short term goals are 3 weeks 03/22/2023)  1.Patient will demonstrate independent use of home exercise program to maintain progress from in clinic treatments. Goal status: Met 03/08/2023  LONG TERM GOALS: (target dates for all long term goals are 12 weeks  05/24/2023 )   1. Patient will demonstrate/report pain at worst less than or equal to 2/10 to facilitate minimal limitation in daily activity secondary to pain symptoms. Goal status: on going 03/18/2023   2. Patient will demonstrate independent use of home exercise program to facilitate ability to maintain/progress functional gains from skilled physical therapy services. Goal status: on going 03/18/2023   3. Patient will demonstrate FOTO outcome > or = 61 % to indicate reduced disability due to condition. Goal status: on going 03/18/2023   4.  Patient will demonstrate cervical AROM WFL s symptoms to facilitate usual head movements for daily activity including  driving, self care.   Goal status: on going 03/18/2023   5.  Patient will demonstrate Rt shoulder MMT 5/5 throughout s symptoms to facilitate usual daily activity at PLOF.   Goal status:  on going 03/18/2023   6.  Patient will demonstrate/report ability to sleep s symptoms disruptions.  Goal status: on going 03/18/2023   7.  Patient will demonstrate/report ability to return to gym workouts for general health considerations.  Goal Status: on going 03/18/2023   PLAN:  PT FREQUENCY: 1-2x/week  PT DURATION: 12 weeks  PLANNED INTERVENTIONS: Therapeutic exercises, Therapeutic activity, Neuro Muscular re-education, Balance training, Gait training, Patient/Family education, Joint mobilization, Stair training, DME instructions, Dry Needling, Electrical stimulation, Cryotherapy, vasopneumatic device,Traction, Moist heat, Taping, Ultrasound, Ionotophoresis 4mg /ml Dexamethasone, and aquatic therapy, Manual therapy.  All included unless contraindicated  PLAN FOR NEXT SESSION: if improved symptoms, trial no DN on next visit.  Recheck arm strength, ROM neck.    Chyrel Masson, PT, DPT, OCS, ATC 03/15/23  11:41 AM    Referring diagnosis? M54.9,G89.29 (ICD-10-CM) - Chronic upper back pain Treatment diagnosis? (if different than referring diagnosis) M54.2 What was this (referring dx) caused by? []  Surgery [x]  Fall []  Ongoing issue []  Arthritis []  Other: ____________  Laterality: [x]  Rt []  Lt []  Both  Check all possible CPT codes:  *CHOOSE 10 OR LESS*    []  97110 (Therapeutic Exercise)  []  16109 (SLP Treatment)  []  60454 (Neuro Re-ed)   []  92526 (Swallowing Treatment)   []  97116 (Gait Training)   []  [id number] (Cognitive Training, 1st 15 minutes) []  97140 (Manual Therapy)   []  97130 (Cognitive Training, each add'l 15 minutes)  []  97164 (Re-evaluation)                              []  Other, List CPT Code ____________  []  97530 (Therapeutic Activities)     []  97535 (Self Care)   [x]  All codes  above (97110 - 97535)  []  97012 (Mechanical Traction)  [x]  97014 (E-stim Unattended)  []  97032 (E-stim manual)  []  97033 (Ionto)  []  97035 (Ultrasound) [x]  97750 (Physical Performance Training) []  [id number] (Aquatic Therapy) []  97016 (Vasopneumatic Device) []  [id number] (Paraffin) []  97034 (Contrast Bath) []  97597 (Wound Care 1st 20 sq cm) []  97598 (Wound Care each add'l 20 sq cm) []  97760 (Orthotic Fabrication, Fitting, Training Initial) []  [id number] (Prosthetic Management and Training Initial) []  [id number] (Orthotic or Prosthetic Training/ Modification Subsequent)

## 2023-03-16 ENCOUNTER — Encounter: Payer: Self-pay | Admitting: Family Medicine

## 2023-03-16 ENCOUNTER — Encounter: Payer: Medicare HMO | Admitting: Pharmacist
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
## 2023-03-17 ENCOUNTER — Ambulatory Visit: Payer: Medicare HMO | Admitting: Rehabilitative and Restorative Service Providers"

## 2023-03-17 ENCOUNTER — Encounter: Payer: Self-pay | Admitting: Rehabilitative and Restorative Service Providers"

## 2023-03-17 DIAGNOSIS — G8929 Other chronic pain: Secondary | ICD-10-CM

## 2023-03-17 DIAGNOSIS — M25511 Pain in right shoulder: Secondary | ICD-10-CM | POA: Diagnosis not present

## 2023-03-17 DIAGNOSIS — M546 Pain in thoracic spine: Secondary | ICD-10-CM

## 2023-03-17 DIAGNOSIS — M542 Cervicalgia: Secondary | ICD-10-CM | POA: Diagnosis not present

## 2023-03-17 DIAGNOSIS — R293 Abnormal posture: Secondary | ICD-10-CM | POA: Diagnosis not present

## 2023-03-17 DIAGNOSIS — M6281 Muscle weakness (generalized): Secondary | ICD-10-CM | POA: Diagnosis not present

## 2023-03-17 NOTE — Therapy (Signed)
OUTPATIENT PHYSICAL THERAPY TREATMENT   Patient Name: Jennifer [name] MRN: [id number] DOB:December 13, 1955, 67 y.o., female Today's Date: 03/17/2023  END OF SESSION:  PT End of Session - 03/17/23 1122     Visit Number 6    Number of Visits 24    Date for PT Re-Evaluation 05/24/23    Authorization Type [employer] $25 copay    Authorization - Visit Number 6    Authorization - Number of Visits 12    Progress Note Due on Visit 10    PT Start Time 1106    PT Stop Time 1135    PT Time Calculation (min) 29 min    Activity Tolerance Patient tolerated treatment well    Behavior During Therapy WFL for tasks assessed/performed                  Past Medical History:  Diagnosis Date   Allergic rhinitis    Allergy    seasonal   Anemia    Anxiety    Arthritis    hands, knees   Asthma    "as a child"   Blood transfusion without reported diagnosis    "as a child"   Breast cancer 2019   Right Breast Cancer   Cancer 09/2018   Breast CA new DX, right breast   Cervical dysplasia    Common bile duct dilation    Diabetes mellitus without complication    Endometriosis    Esophageal reflux    Family history of adverse reaction to anesthesia    younger sister anxiety for a dew days after   Family history of breast cancer    Family history of breast cancer    Family history of prostate cancer    Hepatic cyst 08/2020   multiple   Hepatic hemangioma 08/2020   intrahepatic hemangioma   Hiatal hernia    HSV-1 (herpes simplex virus 1) infection    Hypertension    pt.denies 10/19/22   Hypothyroid    Migraine with aura, without mention of intractable migraine without mention of status migrainosus    Osteopenia 08/2019   T score -2.2 FRAX 11% / 1.7%   Personal history of radiation therapy 2019   Right Breast Cancer   Pre-diabetes    Raynaud's disease    Past Surgical History:  Procedure Laterality Date   BIOPSY  09/12/2020   Procedure: BIOPSY;  Surgeon: Rachael Fee, MD;   Location: [facility];  Service: Endoscopy;;   BREAST BIOPSY Left 11/27/2022   MM LT BREAST BX W LOC DEV 1ST LESION IMAGE BX SPEC STEREO GUIDE 11/27/2022 [facility]   BREAST EXCISIONAL BIOPSY Bilateral over 10 years ago   benign x 2    BREAST LUMPECTOMY Right 10/10/2018   BREAST LUMPECTOMY WITH RADIOACTIVE SEED AND SENTINEL LYMPH NODE BIOPSY Right 10/10/2018   Procedure: RIGHT BREAST LUMPECTOMY WITH RADIOACTIVE SEED AND RIGHT SENTINEL LYMPH NODE BIOPSY;  Surgeon: Chevis Pretty III, MD;  Location: [facility];  Service: General;  Laterality: Right;   BREAST SURGERY     Benign breast lump excised   CERVICAL CONE BIOPSY  1985   severe dysplasia   COLONOSCOPY  2005 and dec 2019   normal   COLPOSCOPY     endoscopy  10/2018   ESOPHAGOGASTRODUODENOSCOPY (EGD) WITH PROPOFOL N/A 09/12/2020   Procedure: ESOPHAGOGASTRODUODENOSCOPY (EGD) WITH PROPOFOL;  Surgeon: Rachael Fee, MD;  Location: [facility];  Service: Endoscopy;  Laterality: N/A;   EUS N/A 09/12/2020  Procedure: UPPER ENDOSCOPIC ULTRASOUND (EUS) RADIAL;  Surgeon: Rachael Fee, MD;  Location: [facility];  Service: Endoscopy;  Laterality: N/A;   LAPAROSCOPIC ASSISTED VAGINAL HYSTERECTOMY     LAPAROSCOPIC BILATERAL SALPINGO OOPHERECTOMY Bilateral 10/27/2019   Procedure: LAPAROSCOPIC BILATERAL SALPINGO OOPHORECTOMY WITH PERITONEAL WASHINGS;  Surgeon: Genia Del, MD;  Location: [facility] ;  Service: Gynecology;  Laterality: Bilateral;  request 1:00pm on Friday, Dec. 4th in [place] Iqueue time held requests one hour OR time   moles removed from upper back, face lft, 1 between breasts, upper leg left inside  10/18/2019   wearing small round bandaids on for 2 weeks   ROTATOR CUFF REPAIR Bilateral 08/2008   UPPER GASTROINTESTINAL ENDOSCOPY     Patient Active Problem List   Diagnosis Date Noted   Right hand pain 01/21/2023   Right ankle pain 01/21/2023   Thoracic back pain  01/21/2023   Pain in right shoulder 08/12/2022   Hordeolum externum (stye) 08/04/2022   Raynaud's disease 03/17/2022   Left facial swelling 01/25/2022   Left ear pain 01/23/2022   Right leg pain 10/15/2021   Microcytic anemia 07/07/2021   De Quervain's tenosynovitis, right 04/18/2021   Iron deficiency anemia 09/08/2020   Common bile duct dilatation 09/06/2020   Opacity of lung on imaging study 09/06/2020   Dizziness 07/10/2020   History of breast cancer 03/01/2020   Genetic testing 11/14/2019   Bilateral hand pain 11/03/2019   Family history of prostate cancer    Adnexal cyst 09/26/2019   Family history of breast cancer    Malignant neoplasm of upper-inner quadrant of right breast in female, estrogen receptor positive 09/20/2018   B12 deficiency 04/13/2018   Joint pain 04/11/2018   Colon cancer screening 09/16/2017   Prediabetes 07/28/2016   Allergic rhinitis 11/13/2014   Screening for lipoid disorders 09/26/2014   Preventative health care 09/26/2014   Dysuria 08/07/2014   Routine general medical examination at a health care facility 10/28/2012   GERD 09/16/2010   Hypothyroidism 05/07/2008   Osteopenia 05/07/2008   Migraine with aura 11/11/2007    PCP: Judy Pimple MD  REFERRING PROVIDER: Juanda Chance, NP  REFERRING DIAG: [phone] (ICD-10-CM) - Chronic upper back pain M25.511,G89.29 (ICD-10-CM) - Chronic right shoulder pain M79.601 (ICD-10-CM) - Pain of right upper extremity M79.18 (ICD-10-CM) - Myofascial pain syndrome  THERAPY DIAG:  Cervicalgia  Pain in thoracic spine  Chronic right shoulder pain  Abnormal posture  Muscle weakness (generalized)  Rationale for Evaluation and Treatment: Rehabilitation  ONSET DATE: 01/21/2023  SUBJECTIVE:  SUBJECTIVE  STATEMENT: She reported having increased complaints in area (not soreness) in last day or so.  She did think the new exercises were good.    PERTINENT HISTORY:  Arthritis, history of Rt breast cancer, DM, osteopenia, raynauds disease.  History of bilateral shoulder surgeries.   PAIN:  NPRS scale: 2/10 Pain location: Rt shoulder, neck Pain description: headaches, tightness, sharp pain, throbbing.  Aggravating factors: intermittent, sometimes with movement.  Relieving factors: muscle relaxers, patches, TENS unit  PRECAUTIONS: None  WEIGHT BEARING RESTRICTIONS: No  FALLS:  Has patient fallen in last 6 months? Yes. Number of falls 1 fall No fear of falls reported.   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   03/01/2023 Increased sensitivity to light touch in Rt cervical, upper trap, posterior shoulder.  Trigger points noted in Rt upper trap, levator, infraspinatus, Rt cervical paraspinals.  Concordant symptoms noted with trigger points.   CERVICAL ROM:   ROM AROM (deg) 03/01/2023 AROM 03/08/2023  Flexion 55 c stretch feel 65  Extension 42 50  Right lateral flexion    Left lateral flexion    Right rotation 48 c Rt cervical pain 65  Left rotation 48 60   (Blank rows = not tested)  UPPER EXTREMITY MMT:  MMT Right 03/01/2023 Left 03/01/2023  Shoulder flexion 5/5 5/5  Shoulder extension     Shoulder abduction 4+/5 4+/5  Shoulder adduction    Shoulder extension    Shoulder internal rotation 5/5 5/5  Shoulder external rotation 4/5 5/5  Elbow flexion    Elbow extension    Wrist flexion    Wrist extension    Wrist ulnar deviation    Wrist radial deviation    Wrist pronation    Wrist supination     (Blank rows = not tested)  UPPER EXTREMITY ROM:  ROM Right 03/01/2023 Left 03/01/2023  Shoulder flexion    Shoulder extension    Shoulder abduction    Shoulder adduction    Shoulder extension    Shoulder internal rotation    Shoulder external rotation    Middle trapezius    Lower trapezius    Elbow flexion    Elbow extension    Wrist flexion    Wrist extension    Wrist ulnar deviation    Wrist radial deviation    Wrist pronation    Wrist supination    Grip strength     (Blank rows = not tested)  CERVICAL SPECIAL TESTS:  03/01/2023 No specific testing today due to pain levels. (Check distraction benefits in future)  FUNCTIONAL TESTS:  03/01/2023 No specific testing today   TODAY'S TREATMENT:  DATE: 03/17/2023 Therex:  Manual: Compression to Rt infraspinatus, Rt upper trap  for trigger point release.  Prone cPA , regional PA mid thoracic region g3.   Trigger Point Dry-Needling  Treatment instructions: Expect mild to moderate muscle soreness. S/S of pneumothorax if dry needled over a lung field, and to seek immediate medical attention should they occur. Patient verbalized understanding of these instructions and education.  Patient Consent Given: Yes Education handout provided: Previously provided Muscles treated: Rt infraspinatus, Rt upper trap  Treatment response/outcome: Concordant symptoms c twitch response.  Good tolerance overall   Modalities Pre mod to Rt upper trap, Rt shoulder in prone with moist heat 13 mins  TODAY'S TREATMENT:                                                                            DATE: 03/15/2023 Therex: UBE UE only 3 mins fwd/back each way lvl 2.5 fwd, 1.5 backward Prone scapular retraction 5 sec hold x 10 Prone scapular retraction c GH ext 5 sec hold x 10 Prone scapular retraction c horizontal abduction 5 sec hold x 10 Seated Rt upper trap stretch 15 sec x 5 Seated Rt shoulder cross arm stretch 15 sec x 5  Manual: Compression to Rt infraspinatus, Rt upper trap  for trigger point release.  Prone cPA , regional PA mid thoracic region g3.   Trigger Point Dry-Needling  Treatment instructions: Expect mild to moderate muscle soreness. S/S of pneumothorax if dry needled over a lung field, and to seek immediate medical attention should they occur. Patient verbalized understanding of these instructions and education.  Patient Consent Given: Yes Education handout provided: Previously provided Muscles treated: Rt infraspinatus, Rt upper trap  Treatment response/outcome: Concordant symptoms c twitch response.  Good tolerance overall   Modalities Pre mod to Rt upper trap, Rt shoulder (anterior/posterior pad placement during self stretching)   TODAY'S TREATMENT:                                                                           DATE: 03/12/2023 Manual: Compression to Rt infraspinatus, Rt upper trap  for trigger point release.  Prone cPA , regional PA mid thoracic region g3.   Trigger Point Dry-Needling  Treatment instructions: Expect mild to moderate muscle soreness. S/S of pneumothorax if dry needled over a lung field, and to seek immediate medical attention should they occur. Patient verbalized understanding of these instructions and education.  Patient Consent Given: Yes Education handout provided: Previously provided Muscles treated: Rt infraspinatus, Rt upper trap  Treatment response/outcome: Concordant symptoms c twitch response.  Good tolerance overall (reduced reaction by nervous symptom today)   Modalities IFC to tolerance 10  mins in sitting Rt upper/trap posterior scapular region  Therex: Standing green band rows 2 x 15 Standing green band GH ext 2 x 15 Seated green band ER c scapular retraction x 15    PATIENT EDUCATION:  03/01/2023 Education details: HEP,  POC, DN handout Person educated: Patient Education method: Explanation, Demonstration, Verbal cues, and Handouts Education comprehension: verbalized understanding, returned demonstration, and verbal cues required  HOME EXERCISE PROGRAM: Access Code: [email or web address] URL: [email or web address] Date: 03/08/2023 Prepared by: Chyrel Masson  Exercises - Seated Upper Trapezius Stretch  - 2 x daily - 7 x weekly - 1 sets - 5 reps - 15 hold - Gentle Levator Scapulae Stretch (Mirrored)  - 2 x daily - 7 x weekly - 1 sets - 5 reps - 15 hold - Standing Shoulder Posterior Capsule Stretch (Mirrored)  - 2 x daily - 7 x weekly - 1 sets - 5 reps - 15 hold - Seated Scapular Retraction  - 3-5 x daily - 7 x weekly - 1 sets - 10 reps - 3-5 hold - Standing Bilateral Low Shoulder Row with Anchored Resistance  - 1-2 x daily - 7 x weekly - 2-3 sets - 10-15 reps - Shoulder Extension with Resistance  - 1-2 x daily - 7 x weekly - 1-2 sets - 10-15 reps - Shoulder External Rotation and Scapular Retraction with Resistance  - 1-2 x daily - 7 x weekly - 1-2 sets - 10-15 reps  ASSESSMENT:  CLINICAL IMPRESSION: Reduced active exercise treatment today due to complaints of symptoms upon arrival.  Combination of manual/DN and use of estim/heat provided good reduction of symptoms at end of session.  Plan to return to progressive strengthening as able based off symptoms.   In general, reducing amount of trigger points and twitch responses noted over last few visits.    OBJECTIVE IMPAIRMENTS: decreased activity tolerance, decreased endurance, decreased mobility, decreased ROM, decreased strength, increased fascial restrictions, impaired perceived functional ability, increased  muscle spasms, impaired flexibility, impaired UE functional use, improper body mechanics, postural dysfunction, and pain.   ACTIVITY LIMITATIONS: carrying, lifting, bending, sitting, standing, sleeping, bed mobility, reach over head, and hygiene/grooming  PARTICIPATION LIMITATIONS: meal prep, cleaning, laundry, interpersonal relationship, driving, shopping, community activity, and occupation  PERSONAL FACTORS: Time since onset of injury/illness/exacerbation , severity of symptoms, history of Arthritis, history of Rt breast cancer, DM, osteopenia, raynauds diseaseare also affecting patient's functional outcome.   REHAB POTENTIAL: Good  CLINICAL DECISION MAKING: Stable/uncomplicated  EVALUATION COMPLEXITY: Low   GOALS: Goals reviewed with patient? Yes  SHORT TERM GOALS: (target date for Short term goals are 3 weeks 03/22/2023)  1.Patient will demonstrate independent use of home exercise program to maintain progress from in clinic treatments. Goal status: Met 03/08/2023  LONG TERM GOALS: (target dates for all long term goals are 12 weeks  05/24/2023 )   1. Patient will demonstrate/report pain at worst less than or equal to 2/10 to facilitate minimal limitation in daily activity secondary to pain symptoms. Goal status: on going 03/18/2023   2. Patient will demonstrate independent use of home exercise program to facilitate ability to maintain/progress functional gains from skilled physical therapy services. Goal status: on going 03/18/2023   3. Patient will demonstrate FOTO outcome > or = 61 % to indicate reduced disability due to condition. Goal status: on going 03/18/2023   4.  Patient will demonstrate cervical AROM WFL s symptoms to facilitate usual head movements for daily activity including driving, self care.   Goal status: on going 03/18/2023   5.  Patient will demonstrate Rt shoulder MMT 5/5 throughout s symptoms to facilitate usual daily activity at PLOF.   Goal status: on going  03/18/2023   6.  Patient will demonstrate/report ability to sleep s symptoms disruptions.  Goal status: on going 03/18/2023   7.  Patient will demonstrate/report ability to return to gym workouts for general health considerations.  Goal Status: on going 03/18/2023   PLAN:  PT FREQUENCY: 1-2x/week  PT DURATION: 12 weeks  PLANNED INTERVENTIONS: Therapeutic exercises, Therapeutic activity, Neuro Muscular re-education, Balance training, Gait training, Patient/Family education, Joint mobilization, Stair training, DME instructions, Dry Needling, Electrical stimulation, Cryotherapy, vasopneumatic device,Traction, Moist heat, Taping, Ultrasound, Ionotophoresis /ml Dexamethasone, and aquatic therapy, Manual therapy.  All included unless contraindicated  PLAN FOR NEXT SESSION: Recheck arm strength   Chyrel Masson, PT, DPT, OCS, ATC 03/17/23  11:30 AM    Referring diagnosis? M54.9,G89.29 (ICD-10-CM) - Chronic upper back pain Treatment diagnosis? (if different than referring diagnosis) M54.2 What was this (referring dx) caused by?  Surgery  Fall  Ongoing issue  Arthritis  Other: ____________  Laterality:  Rt  Lt  Both  Check all possible CPT codes:  *CHOOSE 10 OR LESS*     97110 (Therapeutic Exercise)   92507 (SLP Treatment)   97112 (Neuro Re-ed)    92526 (Swallowing Treatment)    97116 (Gait Training)    [id number] (Cognitive Training, 1st 15 minutes)  97140 (Manual Therapy)    97130 (Cognitive Training, each add'l 15 minutes)   97164 (Re-evaluation)                               Other, List CPT Code ____________   97530 (Therapeutic Activities)      97535 (Self Care)    All codes above (97110 - 97535)   97012 (Mechanical Traction)   52841 (E-stim Unattended)   97032 (E-stim manual)   97033 (Ionto)   97035 (Ultrasound)  97750 (Physical Performance Training)  [id number] (Aquatic Therapy)  97016 (Vasopneumatic Device)   [id number] (Paraffin)  97034 (Contrast Bath)  97597 (Wound Care 1st 20 sq cm)  97598 (Wound Care each add'l 20 sq cm)  97760 (Orthotic Fabrication, Fitting, Training Initial)  [id number] (Prosthetic Management and Training Initial)  [id number] (Orthotic or Prosthetic Training/ Modification Subsequent)

## 2023-03-18 DIAGNOSIS — G518 Other disorders of facial nerve: Secondary | ICD-10-CM | POA: Diagnosis not present

## 2023-03-18 DIAGNOSIS — M542 Cervicalgia: Secondary | ICD-10-CM | POA: Diagnosis not present

## 2023-03-18 DIAGNOSIS — M791 Myalgia, unspecified site: Secondary | ICD-10-CM | POA: Diagnosis not present

## 2023-03-18 DIAGNOSIS — G43719 Chronic migraine without aura, intractable, without status migrainosus: Secondary | ICD-10-CM | POA: Diagnosis not present

## 2023-03-18 DIAGNOSIS — G43019 Migraine without aura, intractable, without status migrainosus: Secondary | ICD-10-CM | POA: Diagnosis not present

## 2023-03-20 ENCOUNTER — Emergency Department
Admission: EM | Admit: 2023-03-20 | Discharge: 2023-03-20 | Disposition: A | Discharge status: Home / Self Care | Payer: Medicare HMO | Attending: Emergency Medicine | Admitting: Emergency Medicine

## 2023-03-20 DIAGNOSIS — G43909 Migraine, unspecified, not intractable, without status migrainosus: Secondary | ICD-10-CM | POA: Diagnosis present

## 2023-03-20 DIAGNOSIS — G43001 Migraine without aura, not intractable, with status migrainosus: Secondary | ICD-10-CM | POA: Diagnosis not present

## 2023-03-20 DIAGNOSIS — G43009 Migraine without aura, not intractable, without status migrainosus: Secondary | ICD-10-CM | POA: Diagnosis not present

## 2023-03-20 MED ORDER — SODIUM CHLORIDE 0.9 % IV BOLUS
1000.0000 mL | Freq: Once | INTRAVENOUS | Status: AC
  Administered 2023-03-20: 1000 mL via INTRAVENOUS

## 2023-03-20 MED ORDER — DEXAMETHASONE SODIUM PHOSPHATE 10 MG/ML IJ SOLN
10.0000 mg | Freq: Once | INTRAMUSCULAR | Status: DC
  Filled 2023-03-20: qty 1

## 2023-03-20 MED ORDER — DEXAMETHASONE SODIUM PHOSPHATE 10 MG/ML IJ SOLN
10.0000 mg | Freq: Once | INTRAMUSCULAR | Status: AC
  Administered 2023-03-20: 10 mg via INTRAVENOUS

## 2023-03-20 MED ORDER — ONDANSETRON HCL 4 MG/2ML IJ SOLN
4.0000 mg | Freq: Once | INTRAMUSCULAR | Status: AC
  Administered 2023-03-20: 4 mg via INTRAVENOUS
  Filled 2023-03-20: qty 2

## 2023-03-20 MED ORDER — KETOROLAC TROMETHAMINE 15 MG/ML IJ SOLN
15.0000 mg | Freq: Once | INTRAMUSCULAR | Status: AC
  Administered 2023-03-20: 15 mg via INTRAVENOUS
  Filled 2023-03-20: qty 1

## 2023-03-20 NOTE — ED Provider Notes (Signed)
[facility] Provider Note    Event Date/Time   First MD Initiated Contact with Patient 03/20/23 1112     (approximate)   History   Migraine   HPI  Jennifer [name] is a 67 y.o. female presents to the ED with complaint of migraine headache.  Patient has a history of the same and has been seen in the emergency department when her medication at home does not work.  She reports that IV medication usually works for her headache.  No change in her headache and this is not the worst headache she has ever had in her life.     Physical Exam   Triage Vital Signs: ED Triage Vitals  Enc Vitals Group     BP 03/20/23 1103 118/83     Pulse Rate 03/20/23 1103 73     Resp 03/20/23 1103 18     Temp 03/20/23 1103 98.3 F (36.8 C)     Temp Source 03/20/23 1103 Oral     SpO2 03/20/23 1103 98 %     Weight 03/20/23 1102 132 lb 4.4 oz (60 kg)     Height --      Head Circumference --      Peak Flow --      Pain Score --      Pain Loc --      Pain Edu? --      Excl. in GC? --     Most recent vital signs: Vitals:   03/20/23 1103  BP: 118/83  Pulse: 73  Resp: 18  Temp: 98.3 F (36.8 C)  SpO2: 98%     General: Awake, no distress.  Alert, talkative, oriented and answering questions appropriately. CV:  Good peripheral perfusion.  Heart regular rate and rhythm. Resp:  Normal effort.  Lungs are clear bilaterally. Abd:  No distention.  Other:  PERRLA, EOMI's, cranial nerves II through XII grossly intact.  Speech is normal.  Patient having some photophobia and sensitivity while in the exam room.  Patient is able to stand and ambulate without any assistance.  Good muscle strength bilaterally.   ED Results / Procedures / Treatments   Labs (all labs ordered are listed, but only abnormal results are displayed) Labs Reviewed - No data to display    PROCEDURES:  Critical Care performed:   Procedures   MEDICATIONS ORDERED IN ED: Medications  sodium chloride  0.9 % bolus 1,000 mL (0 mLs Intravenous Stopped 03/20/23 1300)  ondansetron (ZOFRAN) injection 4 mg (4 mg Intravenous Given 03/20/23 1138)  ketorolac (TORADOL) 15 MG/ML injection 15 mg (15 mg Intravenous Given 03/20/23 1138)  dexamethasone (DECADRON) injection 10 mg (10 mg Intravenous Given 03/20/23 1145)     IMPRESSION / MDM / ASSESSMENT AND PLAN / ED COURSE  I reviewed the triage vital signs and the nursing notes.   Differential diagnosis includes, but is not limited to, migraine, muscle contraction headache, rebound headache.  67 year old female presents to the ED with complaint of migraine headache with a history of the same.  Patient states she has taken her migraine medication at home without complete relief of her headache that has continued for the last 4 days.  No change in her headache pattern and has been seen in the ED for this.  Generally Zofran, Toradol and Decadron have helped with her headaches in the past.  Physical exam was benign with exception of photosensitivity.  Patient was improved after fluids and the above IV medications.  She  has to follow-up with her regular doctor as needed and continue with her regular medications.      Patient's presentation is most consistent with acute illness / injury with system symptoms.  FINAL CLINICAL IMPRESSION(S) / ED DIAGNOSES   Final diagnoses:  Migraine without aura and with status migrainosus, not intractable     Rx / DC Orders   ED Discharge Orders     None        Note:  This document was prepared using Dragon voice recognition software and may include unintentional dictation errors.   Tommi Rumps, PA-C 03/20/23 1317    Minna Antis, MD 03/20/23 [phone]

## 2023-03-20 NOTE — ED Triage Notes (Signed)
Pt presents to the ED due to migraine. Pt states she is having rebound migraines. She had four and home medications are not working. Pt has hx of migraines. Pt states she comes here often for treatments. Pt A&Ox4

## 2023-03-20 NOTE — Discharge Instructions (Signed)
Continue medication as prescribed by your doctor.

## 2023-03-20 NOTE — ED Notes (Signed)
1 yof with a c/c of migraines on and off for the past 4 days. The pt does have a hx of same. The pt is warm, pink, and dry. The pt is alert and oriented x 4.

## 2023-03-22 ENCOUNTER — Ambulatory Visit: Payer: Medicare HMO | Admitting: Physical Medicine and Rehabilitation

## 2023-03-22 ENCOUNTER — Encounter: Payer: Self-pay | Admitting: Physical Medicine and Rehabilitation

## 2023-03-22 ENCOUNTER — Ambulatory Visit (INDEPENDENT_AMBULATORY_CARE_PROVIDER_SITE_OTHER): Payer: Medicare HMO | Admitting: Rehabilitative and Restorative Service Providers"

## 2023-03-22 ENCOUNTER — Encounter: Payer: Self-pay | Admitting: Rehabilitative and Restorative Service Providers"

## 2023-03-22 DIAGNOSIS — M25511 Pain in right shoulder: Secondary | ICD-10-CM

## 2023-03-22 DIAGNOSIS — G8929 Other chronic pain: Secondary | ICD-10-CM

## 2023-03-22 DIAGNOSIS — M549 Dorsalgia, unspecified: Secondary | ICD-10-CM | POA: Diagnosis not present

## 2023-03-22 DIAGNOSIS — M6281 Muscle weakness (generalized): Secondary | ICD-10-CM | POA: Diagnosis not present

## 2023-03-22 DIAGNOSIS — M546 Pain in thoracic spine: Secondary | ICD-10-CM | POA: Diagnosis not present

## 2023-03-22 DIAGNOSIS — M7918 Myalgia, other site: Secondary | ICD-10-CM | POA: Diagnosis not present

## 2023-03-22 DIAGNOSIS — M79601 Pain in right arm: Secondary | ICD-10-CM | POA: Diagnosis not present

## 2023-03-22 DIAGNOSIS — M542 Cervicalgia: Secondary | ICD-10-CM

## 2023-03-22 DIAGNOSIS — R293 Abnormal posture: Secondary | ICD-10-CM

## 2023-03-22 NOTE — Progress Notes (Unsigned)
Functional Pain Scale - descriptive words and definitions  Uncomfortable (3)  Pain is present but can complete all ADL's/sleep is slightly affected and passive distraction only gives marginal relief. Mild range order  Average Pain  1  Upper back pain. Feeling a lot better

## 2023-03-22 NOTE — Therapy (Signed)
OUTPATIENT PHYSICAL THERAPY TREATMENT   Patient Name: Jennifer [name] MRN: [id number] DOB:June 21, 1956, 67 y.o., female Today's Date: 03/22/2023  END OF SESSION:  PT End of Session - 03/22/23 1102     Visit Number 7    Number of Visits 24    Date for PT Re-Evaluation 05/24/23    Authorization Type [employer] $25 copay    Authorization - Visit Number 7    Authorization - Number of Visits 12    Progress Note Due on Visit 10    PT Start Time 1101    PT Stop Time 1139    PT Time Calculation (min) 38 min    Activity Tolerance Patient tolerated treatment well    Behavior During Therapy WFL for tasks assessed/performed                   Past Medical History:  Diagnosis Date   Allergic rhinitis    Allergy    seasonal   Anemia    Anxiety    Arthritis    hands, knees   Asthma    "as a child"   Blood transfusion without reported diagnosis    "as a child"   Breast cancer (HCC) 2019   Right Breast Cancer   Cancer (HCC) 09/2018   Breast CA new DX, right breast   Cervical dysplasia    Common bile duct dilation    Diabetes mellitus without complication (HCC)    Endometriosis    Esophageal reflux    Family history of adverse reaction to anesthesia    younger sister anxiety for a dew days after   Family history of breast cancer    Family history of breast cancer    Family history of prostate cancer    Hepatic cyst 08/2020   multiple   Hepatic hemangioma 08/2020   intrahepatic hemangioma   Hiatal hernia    HSV-1 (herpes simplex virus 1) infection    Hypertension    pt.denies 10/19/22   Hypothyroid    Migraine with aura, without mention of intractable migraine without mention of status migrainosus    Osteopenia 08/2019   T score -2.2 FRAX 11% / 1.7%   Personal history of radiation therapy 2019   Right Breast Cancer   Pre-diabetes    Raynaud's disease    Past Surgical History:  Procedure Laterality Date   BIOPSY  09/12/2020   Procedure: BIOPSY;  Surgeon:  Rachael Fee, MD;  Location: [facility];  Service: Endoscopy;;   BREAST BIOPSY Left 11/27/2022   MM LT BREAST BX W LOC DEV 1ST LESION IMAGE BX SPEC STEREO GUIDE 11/27/2022 [facility]   BREAST EXCISIONAL BIOPSY Bilateral over 10 years ago   benign x 2    BREAST LUMPECTOMY Right 10/10/2018   BREAST LUMPECTOMY WITH RADIOACTIVE SEED AND SENTINEL LYMPH NODE BIOPSY Right 10/10/2018   Procedure: RIGHT BREAST LUMPECTOMY WITH RADIOACTIVE SEED AND RIGHT SENTINEL LYMPH NODE BIOPSY;  Surgeon: Chevis Pretty III, MD;  Location: [facility];  Service: General;  Laterality: Right;   BREAST SURGERY     Benign breast lump excised   CERVICAL CONE BIOPSY  1985   severe dysplasia   COLONOSCOPY  2005 and dec 2019   normal   COLPOSCOPY     endoscopy  10/2018   ESOPHAGOGASTRODUODENOSCOPY (EGD) WITH PROPOFOL N/A 09/12/2020   Procedure: ESOPHAGOGASTRODUODENOSCOPY (EGD) WITH PROPOFOL;  Surgeon: Rachael Fee, MD;  Location: [facility];  Service: Endoscopy;  Laterality: N/A;   EUS  N/A 09/12/2020   Procedure: UPPER ENDOSCOPIC ULTRASOUND (EUS) RADIAL;  Surgeon: Rachael Fee, MD;  Location: [facility];  Service: Endoscopy;  Laterality: N/A;   LAPAROSCOPIC ASSISTED VAGINAL HYSTERECTOMY     LAPAROSCOPIC BILATERAL SALPINGO OOPHERECTOMY Bilateral 10/27/2019   Procedure: LAPAROSCOPIC BILATERAL SALPINGO OOPHORECTOMY WITH PERITONEAL WASHINGS;  Surgeon: Genia Del, MD;  Location: [facility];  Service: Gynecology;  Laterality: Bilateral;  request 1:00pm on Friday, Dec. 4th in [place] Iqueue time held requests one hour OR time   moles removed from upper back, face lft, 1 between breasts, upper leg left inside  10/18/2019   wearing small round bandaids on for 2 weeks   ROTATOR CUFF REPAIR Bilateral 08/2008   UPPER GASTROINTESTINAL ENDOSCOPY     Patient Active Problem List   Diagnosis Date Noted   Right hand pain 01/21/2023   Right ankle pain 01/21/2023    Thoracic back pain 01/21/2023   Pain in right shoulder 08/12/2022   Hordeolum externum (stye) 08/04/2022   Raynaud's disease 03/17/2022   Left facial swelling 01/25/2022   Left ear pain 01/23/2022   Right leg pain 10/15/2021   Microcytic anemia 07/07/2021   De Quervain's tenosynovitis, right 04/18/2021   Iron deficiency anemia 09/08/2020   Common bile duct dilatation 09/06/2020   Opacity of lung on imaging study 09/06/2020   Dizziness 07/10/2020   History of breast cancer 03/01/2020   Genetic testing 11/14/2019   Bilateral hand pain 11/03/2019   Family history of prostate cancer    Adnexal cyst 09/26/2019   Family history of breast cancer    Malignant neoplasm of upper-inner quadrant of right breast in female, estrogen receptor positive (HCC) 09/20/2018   B12 deficiency 04/13/2018   Joint pain 04/11/2018   Colon cancer screening 09/16/2017   Prediabetes 07/28/2016   Allergic rhinitis 11/13/2014   Screening for lipoid disorders 09/26/2014   Preventative health care 09/26/2014   Dysuria 08/07/2014   Routine general medical examination at a health care facility 10/28/2012   GERD 09/16/2010   Hypothyroidism 05/07/2008   Osteopenia 05/07/2008   Migraine with aura 11/11/2007    PCP: Judy Pimple MD  REFERRING PROVIDER: Juanda Chance, NP  REFERRING DIAG: M54.9,G89.29 (ICD-10-CM) - Chronic upper back pain M25.511,G89.29 (ICD-10-CM) - Chronic right shoulder pain M79.601 (ICD-10-CM) - Pain of right upper extremity M79.18 (ICD-10-CM) - Myofascial pain syndrome  THERAPY DIAG:  Cervicalgia  Pain in thoracic spine  Chronic right shoulder pain  Abnormal posture  Muscle weakness (generalized)  Rationale for Evaluation and Treatment: Rehabilitation  ONSET DATE: 01/21/2023  SUBJECTIVE:  SUBJECTIVE STATEMENT: She indicated having migraine that led to ED visit for headache only.  She reported Rt neck and shoulder has been doing better though.   PERTINENT HISTORY:  Arthritis, history of Rt breast cancer, DM, osteopenia, raynauds disease.  History of bilateral shoulder surgeries.   PAIN:  NPRS scale: no specific pain to report on number scale.  Pain location: Rt shoulder, neck Pain description: headaches, tightness, sharp pain, throbbing.  Aggravating factors: intermittent, sometimes with movement.  Relieving factors: muscle relaxers, patches, TENS unit  PRECAUTIONS: None  WEIGHT BEARING RESTRICTIONS: No  FALLS:  Has patient fallen in last 6 months? Yes. Number of falls 1 fall No fear of falls reported.   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   03/01/2023 Increased sensitivity to light touch in Rt cervical, upper trap, posterior shoulder.  Trigger points noted in Rt upper trap, levator, infraspinatus, Rt cervical paraspinals.  Concordant symptoms noted with trigger points.   CERVICAL ROM:   ROM AROM (deg) 03/01/2023 AROM 03/08/2023  Flexion 55 c stretch feel 65  Extension 42 50  Right lateral flexion    Left lateral flexion    Right rotation 48 c Rt cervical pain 65  Left rotation 48 60   (Blank rows = not tested)  UPPER EXTREMITY  MMT:  MMT Right 03/01/2023 Left 03/01/2023 Right 03/22/2023 Left 03/22/2023  Shoulder flexion 5/5 5/5 5/5 5/5  Shoulder extension      Shoulder abduction 4+/5 4+/5 4+/5 4+/5  Shoulder adduction      Shoulder extension      Shoulder internal rotation 5/5 5/5 5/5 5/5  Shoulder external rotation 4/5 5/5 5/5 5/5  Elbow flexion      Elbow extension      Wrist flexion      Wrist extension      Wrist ulnar deviation      Wrist radial deviation      Wrist pronation      Wrist supination       (Blank rows = not tested)  UPPER EXTREMITY ROM:  ROM Right 03/01/2023 Left 03/01/2023  Shoulder flexion    Shoulder extension    Shoulder abduction    Shoulder adduction    Shoulder extension    Shoulder internal rotation    Shoulder external rotation    Middle trapezius    Lower trapezius    Elbow flexion    Elbow extension    Wrist flexion    Wrist extension    Wrist ulnar deviation    Wrist radial deviation    Wrist pronation    Wrist supination    Grip strength     (Blank rows = not tested)  CERVICAL SPECIAL TESTS:  03/01/2023 No specific testing today due to pain levels. (Check distraction benefits in future)  FUNCTIONAL TESTS:  03/01/2023 No specific testing today   TODAY'S TREATMENT:  DATE: 03/22/2023 Therex: UBE fwd/back 3 mins each way  Tband green gh ext 3 sec hold past neutral x 15 with band set above shoulder height on attachment Standing green band ER to neutral with towel under arm 3 sec hold 2 x 15, performed bilaterally  Review of existing HEP and FOTO reassessment.   Manual: Supine cervical distraction intermittent   TODAY'S TREATMENT:                                                                           DATE: 03/17/2023 Therex:  Manual: Compression to Rt infraspinatus, Rt upper trap  for trigger point release.  Prone cPA , regional PA mid thoracic region g3.   Trigger Point  Dry-Needling  Treatment instructions: Expect mild to moderate muscle soreness. S/S of pneumothorax if dry needled over a lung field, and to seek immediate medical attention should they occur. Patient verbalized understanding of these instructions and education.  Patient Consent Given: Yes Education handout provided: Previously provided Muscles treated: Rt infraspinatus, Rt upper trap  Treatment response/outcome: Concordant symptoms c twitch response.  Good tolerance overall   Modalities Pre mod to Rt upper trap, Rt shoulder in prone with moist heat 13 mins  TODAY'S TREATMENT:                                                                           DATE: 03/15/2023 Therex: UBE UE only 3 mins fwd/back each way lvl 2.5 fwd, 1.5 backward Prone scapular retraction 5 sec hold x 10 Prone scapular retraction c GH ext 5 sec hold x 10 Prone scapular retraction c horizontal abduction 5 sec hold x 10 Seated Rt upper trap stretch 15 sec x 5 Seated Rt shoulder cross arm stretch 15 sec x 5  Manual: Compression to Rt infraspinatus, Rt upper trap  for trigger point release.  Prone cPA , regional PA mid thoracic region g3.   Trigger Point Dry-Needling  Treatment instructions: Expect mild to moderate muscle soreness. S/S of pneumothorax if dry needled over a lung field, and to seek immediate medical attention should they occur. Patient verbalized understanding of these instructions and education.  Patient Consent Given: Yes Education handout provided: Previously provided Muscles treated: Rt infraspinatus, Rt upper trap  Treatment response/outcome: Concordant symptoms c twitch response.  Good tolerance overall   Modalities Pre mod to Rt upper trap, Rt shoulder (anterior/posterior pad placement during self stretching)    PATIENT EDUCATION:  03/01/2023 Education details: HEP, POC, DN handout Person educated: Patient Education method: Explanation, Demonstration, Verbal cues, and Handouts Education  comprehension: verbalized understanding, returned demonstration, and verbal cues required  HOME EXERCISE PROGRAM: Access Code: [email or web address] URL: [email or web address] Date: 03/08/2023 Prepared by: Chyrel Masson  Exercises - Seated Upper Trapezius Stretch  - 2 x daily - 7 x weekly - 1 sets - 5 reps - 15 hold - Gentle Levator Scapulae Stretch (Mirrored)  - 2 x daily - 7 x weekly -  1 sets - 5 reps - 15 hold - Standing Shoulder Posterior Capsule Stretch (Mirrored)  - 2 x daily - 7 x weekly - 1 sets - 5 reps - 15 hold - Seated Scapular Retraction  - 3-5 x daily - 7 x weekly - 1 sets - 10 reps - 3-5 hold - Standing Bilateral Low Shoulder Row with Anchored Resistance  - 1-2 x daily - 7 x weekly - 2-3 sets - 10-15 reps - Shoulder Extension with Resistance  - 1-2 x daily - 7 x weekly - 1-2 sets - 10-15 reps - Shoulder External Rotation and Scapular Retraction with Resistance  - 1-2 x daily - 7 x weekly - 1-2 sets - 10-15 reps  ASSESSMENT:  CLINICAL IMPRESSION: Positive presentation shown today with minimal pain symptoms.  Pt to benefit from improved strength and endurance with Rt shoulder for daily activity tolerance.  FOTO was shown as good improvement today.   Continued use of dry needling as desired for symptom relief.    OBJECTIVE IMPAIRMENTS: decreased activity tolerance, decreased endurance, decreased mobility, decreased ROM, decreased strength, increased fascial restrictions, impaired perceived functional ability, increased muscle spasms, impaired flexibility, impaired UE functional use, improper body mechanics, postural dysfunction, and pain.   ACTIVITY LIMITATIONS: carrying, lifting, bending, sitting, standing, sleeping, bed mobility, reach over head, and hygiene/grooming  PARTICIPATION LIMITATIONS: meal prep, cleaning, laundry, interpersonal relationship, driving, shopping, community activity, and occupation  PERSONAL FACTORS: Time since onset of injury/illness/exacerbation ,  severity of symptoms, history of Arthritis, history of Rt breast cancer, DM, osteopenia, raynauds diseaseare also affecting patient's functional outcome.   REHAB POTENTIAL: Good  CLINICAL DECISION MAKING: Stable/uncomplicated  EVALUATION COMPLEXITY: Low   GOALS: Goals reviewed with patient? Yes  SHORT TERM GOALS: (target date for Short term goals are 3 weeks 03/22/2023)  1.Patient will demonstrate independent use of home exercise program to maintain progress from in clinic treatments. Goal status: Met 03/08/2023  LONG TERM GOALS: (target dates for all long term goals are 12 weeks  05/24/2023 )   1. Patient will demonstrate/report pain at worst less than or equal to 2/10 to facilitate minimal limitation in daily activity secondary to pain symptoms. Goal status: on going 03/18/2023   2. Patient will demonstrate independent use of home exercise program to facilitate ability to maintain/progress functional gains from skilled physical therapy services. Goal status: on going 03/18/2023   3. Patient will demonstrate FOTO outcome > or = 61 % to indicate reduced disability due to condition. Goal status: on going 03/18/2023   4.  Patient will demonstrate cervical AROM WFL s symptoms to facilitate usual head movements for daily activity including driving, self care.   Goal status: on going 03/18/2023   5.  Patient will demonstrate Rt shoulder MMT 5/5 throughout s symptoms to facilitate usual daily activity at PLOF.   Goal status: on going 03/18/2023   6.  Patient will demonstrate/report ability to sleep s symptoms disruptions.  Goal status: on going 03/18/2023   7.  Patient will demonstrate/report ability to return to gym workouts for general health considerations.  Goal Status: on going 03/18/2023   PLAN:  PT FREQUENCY: 1-2x/week  PT DURATION: 12 weeks  PLANNED INTERVENTIONS: Therapeutic exercises, Therapeutic activity, Neuro Muscular re-education, Balance training, Gait training,  Patient/Family education, Joint mobilization, Stair training, DME instructions, Dry Needling, Electrical stimulation, Cryotherapy, vasopneumatic device,Traction, Moist heat, Taping, Ultrasound, Ionotophoresis 4mg /ml Dexamethasone, and aquatic therapy, Manual therapy.  All included unless contraindicated  PLAN FOR NEXT SESSION: Recheck follow up  from visit with NP.    Chyrel Masson, PT, DPT, OCS, ATC 03/22/23  11:42 AM    Referring diagnosis? M54.9,G89.29 (ICD-10-CM) - Chronic upper back pain Treatment diagnosis? (if different than referring diagnosis) M54.2 What was this (referring dx) caused by? []  Surgery [x]  Fall []  Ongoing issue []  Arthritis []  Other: ____________  Laterality: [x]  Rt []  Lt []  Both  Check all possible CPT codes:  *CHOOSE 10 OR LESS*    []  97110 (Therapeutic Exercise)  []  92507 (SLP Treatment)  []  16109 (Neuro Re-ed)   []  92526 (Swallowing Treatment)   []  97116 (Gait Training)   []  [id number] (Cognitive Training, 1st 15 minutes) []  97140 (Manual Therapy)   []  97130 (Cognitive Training, each add'l 15 minutes)  []  97164 (Re-evaluation)                              []  Other, List CPT Code ____________  []  97530 (Therapeutic Activities)     []  97535 (Self Care)   [x]  All codes above (97110 - 97535)  []  60454 (Mechanical Traction)  [x]  97014 (E-stim Unattended)  []  97032 (E-stim manual)  []  97033 (Ionto)  []  97035 (Ultrasound) [x]  97750 (Physical Performance Training) []  [id number] (Aquatic Therapy) []  97016 (Vasopneumatic Device) []  [id number] (Paraffin) []  97034 (Contrast Bath) []  97597 (Wound Care 1st 20 sq cm) []  97598 (Wound Care each add'l 20 sq cm) []  97760 (Orthotic Fabrication, Fitting, Training Initial) []  [id number] (Prosthetic Management and Training Initial) []  [id number] (Orthotic or Prosthetic Training/ Modification Subsequent)

## 2023-03-22 NOTE — Progress Notes (Unsigned)
Jennifer [name] - 67 y.o. female MRN [id number]  Date of birth: 07/23/1956  Office Visit Note: Visit Date: 03/22/2023 PCP: Judy Pimple, MD Referred by: Tower, Audrie Gallus, MD  Subjective: Chief Complaint  Patient presents with   Spine - Pain   HPI: Jennifer [name] is a 67 y.o. female who comes in today for evaluation of chronic right scapular pain radiating to shoulder and down right arm, intermittent radiation of pain to head. Pain ongoing for several months, worsened after mechanical fall in February. She describes pain as sore and aching, currently denies pain. Some relief of pain with home exercise regimen, rest, TENS unit, topical pain creams and medications. She is currently undergoing formal physical therapy with our in house team, reports significant relief of pain with dry needling treatments. Recent CT of cervical spine from [facility] exhibits multi level spondylosis, moderate right sided foraminal narrowing on the right at C3-C4, severe left foraminal narrowing at C5-C6. No high grade spinal canal stenosis noted. Patient continues to struggle with chronic headaches, she reports multiple emergency room visits within the last few months. She has referral pending to Dr. Naomie Dean with [facility]. Patient denies focal weakness, numbness and tingling. No recent trauma or falls.    Review of Systems  Musculoskeletal:  Negative for back pain.  Neurological:  Negative for tingling, sensory change, focal weakness and weakness.  All other systems reviewed and are negative.  Otherwise per HPI.  Assessment & Plan: Visit Diagnoses:    ICD-10-CM   1. Chronic right shoulder pain  M25.511    G89.29     2. Chronic upper back pain  M54.9    G89.29     3. Pain of right upper extremity  M79.601     4. Myofascial pain syndrome  M79.18        Plan: Findings:  Chronic right scapular pain radiating to shoulder and down right arm, intermittent radiation of pain to  head. She reports complete resolution of pain post physical therapy/dry needling treatments. Patient continues with conservative therapies such as TENS unit and medications as needed. Patients clinical presentation and exam remain consistent with myofascial pain syndrome. I would like patient to complete regimen of physical therapy and continue with home exercises as tolerated. Can always re-group with PT as needed. Could look at medication management. Patient encouraged to remain active. She is hoping to see Dr. Lucia Gaskins in the coming months for second opinion regarding chronic headaches. She can follow up with [place] as needed. No red flag symptoms noted upon exam today.     Meds & Orders: No orders of the defined types were placed in this encounter.  No orders of the defined types were placed in this encounter.   Follow-up: Return if symptoms worsen or fail to improve.   Procedures: No procedures performed      Clinical History: No specialty comments available.   She reports that she has never smoked. She has never been exposed to tobacco smoke. She has never used smokeless tobacco.  Recent Labs    08/25/22 1025  HGBA1C 6.0    Objective:  VS:  HT:    WT:   BMI:     BP:   HR: bpm  TEMP: ( )  RESP:  Physical Exam Vitals and nursing note reviewed.  HENT:     Head: Normocephalic and atraumatic.     Right Ear: External ear normal.     Left Ear: External ear  normal.     Nose: Nose normal.     Mouth/Throat:     Mouth: Mucous membranes are moist.  Eyes:     Extraocular Movements: Extraocular movements intact.  Cardiovascular:     Rate and Rhythm: Normal rate.     Pulses: Normal pulses.  Pulmonary:     Effort: Pulmonary effort is normal.  Abdominal:     General: Abdomen is flat. There is no distension.  Musculoskeletal:        General: Normal range of motion.     Cervical back: Normal range of motion.     Comments: No discomfort noted with flexion, extension and side-to-side  rotation. Patient has good strength in the upper extremities including 5 out of 5 strength in wrist extension, long finger flexion and APB. Shoulder range of motion is full bilaterally without any sign of impingement. There is no atrophy of the hands intrinsically. Sensation intact bilaterally. Negative Hoffman's sign. Negative Spurling's sign.     Skin:    General: Skin is warm and dry.     Capillary Refill: Capillary refill takes less than 2 seconds.  Neurological:     General: No focal deficit present.     Mental Status: She is alert and oriented to person, place, and time.  Psychiatric:        Mood and Affect: Mood normal.        Behavior: Behavior normal.     Ortho Exam  Imaging: No results found.  Past Medical/Family/Surgical/Social History: Medications & Allergies reviewed per EMR, new medications updated. Patient Active Problem List   Diagnosis Date Noted   Right hand pain 01/21/2023   Right ankle pain 01/21/2023   Thoracic back pain 01/21/2023   Pain in right shoulder 08/12/2022   Hordeolum externum (stye) 08/04/2022   Raynaud's disease 03/17/2022   Left facial swelling 01/25/2022   Left ear pain 01/23/2022   Right leg pain 10/15/2021   Microcytic anemia 07/07/2021   De Quervain's tenosynovitis, right 04/18/2021   Iron deficiency anemia 09/08/2020   Common bile duct dilatation 09/06/2020   Opacity of lung on imaging study 09/06/2020   Dizziness 07/10/2020   History of breast cancer 03/01/2020   Genetic testing 11/14/2019   Bilateral hand pain 11/03/2019   Family history of prostate cancer    Adnexal cyst 09/26/2019   Family history of breast cancer    Malignant neoplasm of upper-inner quadrant of right breast in female, estrogen receptor positive (HCC) 09/20/2018   B12 deficiency 04/13/2018   Joint pain 04/11/2018   Colon cancer screening 09/16/2017   Prediabetes 07/28/2016   Allergic rhinitis 11/13/2014   Screening for lipoid disorders 09/26/2014    Preventative health care 09/26/2014   Dysuria 08/07/2014   Routine general medical examination at a health care facility 10/28/2012   GERD 09/16/2010   Hypothyroidism 05/07/2008   Osteopenia 05/07/2008   Migraine with aura 11/11/2007   Past Medical History:  Diagnosis Date   Allergic rhinitis    Allergy    seasonal   Anemia    Anxiety    Arthritis    hands, knees   Asthma    "as a child"   Blood transfusion without reported diagnosis    "as a child"   Breast cancer (HCC) 2019   Right Breast Cancer   Cancer (HCC) 09/2018   Breast CA new DX, right breast   Cervical dysplasia    Common bile duct dilation    Diabetes mellitus without complication (HCC)  Endometriosis    Esophageal reflux    Family history of adverse reaction to anesthesia    younger sister anxiety for a dew days after   Family history of breast cancer    Family history of breast cancer    Family history of prostate cancer    Hepatic cyst 08/2020   multiple   Hepatic hemangioma 08/2020   intrahepatic hemangioma   Hiatal hernia    HSV-1 (herpes simplex virus 1) infection    Hypertension    pt.denies 10/19/22   Hypothyroid    Migraine with aura, without mention of intractable migraine without mention of status migrainosus    Osteopenia 08/2019   T score -2.2 FRAX 11% / 1.7%   Personal history of radiation therapy 2019   Right Breast Cancer   Pre-diabetes    Raynaud's disease    Family History  Problem Relation Age of Onset   Breast cancer Mother 60   Diabetes Mother    Irritable bowel syndrome Mother    Hypertension Father    Heart disease Father    Hypertension Sister    Anuerysm Sister        head   Irritable bowel syndrome Sister    Hypertension Sister    Anxiety disorder Sister    Other Sister        prediabetes   Celiac disease Brother    Osteoporosis Maternal Grandmother    Diabetes Maternal Grandmother    COPD Paternal Grandmother    Heart disease Paternal Grandfather     Breast cancer Cousin 68       pat first cousin   Breast cancer Cousin        mat second cousin with breast cancer in her 30s   Thyroid cancer Cousin 51       pat first cousin   Prostate cancer Other 62   Colon cancer Neg Hx    Stomach cancer Neg Hx    Esophageal cancer Neg Hx    Pancreatic cancer Neg Hx    Rectal cancer Neg Hx    Colon polyps Neg Hx    Crohn's disease Neg Hx    Ulcerative colitis Neg Hx    Past Surgical History:  Procedure Laterality Date   BIOPSY  09/12/2020   Procedure: BIOPSY;  Surgeon: Rachael Fee, MD;  Location: [facility];  Service: Endoscopy;;   BREAST BIOPSY Left 11/27/2022   MM LT BREAST BX W LOC DEV 1ST LESION IMAGE BX SPEC STEREO GUIDE 11/27/2022 [facility]   BREAST EXCISIONAL BIOPSY Bilateral over 10 years ago   benign x 2    BREAST LUMPECTOMY Right 10/10/2018   BREAST LUMPECTOMY WITH RADIOACTIVE SEED AND SENTINEL LYMPH NODE BIOPSY Right 10/10/2018   Procedure: RIGHT BREAST LUMPECTOMY WITH RADIOACTIVE SEED AND RIGHT SENTINEL LYMPH NODE BIOPSY;  Surgeon: Chevis Pretty III, MD;  Location: [facility];  Service: General;  Laterality: Right;   BREAST SURGERY     Benign breast lump excised   CERVICAL CONE BIOPSY  1985   severe dysplasia   COLONOSCOPY  2005 and dec 2019   normal   COLPOSCOPY     endoscopy  10/2018   ESOPHAGOGASTRODUODENOSCOPY (EGD) WITH PROPOFOL N/A 09/12/2020   Procedure: ESOPHAGOGASTRODUODENOSCOPY (EGD) WITH PROPOFOL;  Surgeon: Rachael Fee, MD;  Location: [facility];  Service: Endoscopy;  Laterality: N/A;   EUS N/A 09/12/2020   Procedure: UPPER ENDOSCOPIC ULTRASOUND (EUS) RADIAL;  Surgeon: Rachael Fee, MD;  Location: [facility];  Service: Endoscopy;  Laterality: N/A;   LAPAROSCOPIC ASSISTED VAGINAL HYSTERECTOMY     LAPAROSCOPIC BILATERAL SALPINGO OOPHERECTOMY Bilateral 10/27/2019   Procedure: LAPAROSCOPIC BILATERAL SALPINGO OOPHORECTOMY WITH PERITONEAL WASHINGS;  Surgeon: Genia Del, MD;   Location: [facility] [place];  Service: Gynecology;  Laterality: Bilateral;  request 1:00pm on Friday, Dec. 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

## 2023-03-23 ENCOUNTER — Telehealth: Payer: Self-pay

## 2023-03-23 NOTE — Progress Notes (Signed)
Called patient to reschedule her missed initial appointment with Al Corpus, PharmD. No answer; left message.  Al Corpus, PharmD notified  Claudina Lick, [place] Clinical Pharmacy Assistant [phone]

## 2023-03-24 ENCOUNTER — Encounter: Payer: Self-pay | Admitting: Rehabilitative and Restorative Service Providers"

## 2023-03-24 ENCOUNTER — Ambulatory Visit: Payer: Medicare HMO | Admitting: Rehabilitative and Restorative Service Providers"

## 2023-03-24 DIAGNOSIS — G8929 Other chronic pain: Secondary | ICD-10-CM | POA: Diagnosis not present

## 2023-03-24 DIAGNOSIS — M546 Pain in thoracic spine: Secondary | ICD-10-CM | POA: Diagnosis not present

## 2023-03-24 DIAGNOSIS — R293 Abnormal posture: Secondary | ICD-10-CM | POA: Diagnosis not present

## 2023-03-24 DIAGNOSIS — M542 Cervicalgia: Secondary | ICD-10-CM | POA: Diagnosis not present

## 2023-03-24 DIAGNOSIS — M25511 Pain in right shoulder: Secondary | ICD-10-CM

## 2023-03-24 DIAGNOSIS — M6281 Muscle weakness (generalized): Secondary | ICD-10-CM | POA: Diagnosis not present

## 2023-03-24 NOTE — Therapy (Signed)
OUTPATIENT PHYSICAL THERAPY TREATMENT   Patient Name: Jennifer [name] MRN: [id number] DOB:07-27-1956, 67 y.o., female Today's Date: 03/24/2023  END OF SESSION:  PT End of Session - 03/24/23 1119     Visit Number 8    Number of Visits 24    Date for PT Re-Evaluation 05/24/23    Authorization Type [employer] $25 copay    Authorization Time Period -05/31/2023    Authorization - Visit Number 8    Authorization - Number of Visits 12    Progress Note Due on Visit 10    PT Start Time 1102    PT Stop Time 1143    PT Time Calculation (min) 41 min    Activity Tolerance Patient tolerated treatment well    Behavior During Therapy WFL for tasks assessed/performed                    Past Medical History:  Diagnosis Date   Allergic rhinitis    Allergy    seasonal   Anemia    Anxiety    Arthritis    hands, knees   Asthma    "as a child"   Blood transfusion without reported diagnosis    "as a child"   Breast cancer (HCC) 2019   Right Breast Cancer   Cancer (HCC) 09/2018   Breast CA new DX, right breast   Cervical dysplasia    Common bile duct dilation    Diabetes mellitus without complication (HCC)    Endometriosis    Esophageal reflux    Family history of adverse reaction to anesthesia    younger sister anxiety for a dew days after   Family history of breast cancer    Family history of breast cancer    Family history of prostate cancer    Hepatic cyst 08/2020   multiple   Hepatic hemangioma 08/2020   intrahepatic hemangioma   Hiatal hernia    HSV-1 (herpes simplex virus 1) infection    Hypertension    pt.denies 10/19/22   Hypothyroid    Migraine with aura, without mention of intractable migraine without mention of status migrainosus    Osteopenia 08/2019   T score -2.2 FRAX 11% / 1.7%   Personal history of radiation therapy 2019   Right Breast Cancer   Pre-diabetes    Raynaud's disease    Past Surgical History:  Procedure Laterality Date   BIOPSY   09/12/2020   Procedure: BIOPSY;  Surgeon: Rachael Fee, MD;  Location: [facility];  Service: Endoscopy;;   BREAST BIOPSY Left 11/27/2022   MM LT BREAST BX W LOC DEV 1ST LESION IMAGE BX SPEC STEREO GUIDE 11/27/2022 [facility]   BREAST EXCISIONAL BIOPSY Bilateral over 10 years ago   benign x 2    BREAST LUMPECTOMY Right 10/10/2018   BREAST LUMPECTOMY WITH RADIOACTIVE SEED AND SENTINEL LYMPH NODE BIOPSY Right 10/10/2018   Procedure: RIGHT BREAST LUMPECTOMY WITH RADIOACTIVE SEED AND RIGHT SENTINEL LYMPH NODE BIOPSY;  Surgeon: Chevis Pretty III, MD;  Location:  SUR[facility]ERY CENTER;  Service: General;  Laterality: Right;   BREAST SURGERY     Benign breast lump excised   CERVICAL CONE BIOPSY  1985   severe dysplasia   COLONOSCOPY  2005 and dec 2019   normal   COLPOSCOPY     endoscopy  10/2018   ESOPHAGOGASTRODUODENOSCOPY (EGD) WITH PROPOFOL N/A 09/12/2020   Procedure: ESOPHAGOGASTRODUODENOSCOPY (EGD) WITH PROPOFOL;  Surgeon: Rachael Fee, MD;  Location: [facility];  Service: Endoscopy;  Laterality: N/A;   EUS N/A 09/12/2020   Procedure: UPPER ENDOSCOPIC ULTRASOUND (EUS) RADIAL;  Surgeon: Rachael Fee, MD;  Location: [facility];  Service: Endoscopy;  Laterality: N/A;   LAPAROSCOPIC ASSISTED VAGINAL HYSTERECTOMY     LAPAROSCOPIC BILATERAL SALPINGO OOPHERECTOMY Bilateral 10/27/2019   Procedure: LAPAROSCOPIC BILATERAL SALPINGO OOPHORECTOMY WITH PERITONEAL WASHINGS;  Surgeon: Genia Del, MD;  Location: [facility];  Service: Gynecology;  Laterality: Bilateral;  request 1:00pm on Friday, Dec. 4th in [place] Iqueue time held requests one hour OR time   moles removed from upper back, face lft, 1 between breasts, upper leg left inside  10/18/2019   wearing small round bandaids on for 2 weeks   ROTATOR CUFF REPAIR Bilateral 08/2008   UPPER GASTROINTESTINAL ENDOSCOPY     Patient Active Problem List   Diagnosis Date Noted   Right hand pain  01/21/2023   Right ankle pain 01/21/2023   Thoracic back pain 01/21/2023   Pain in right shoulder 08/12/2022   Hordeolum externum (stye) 08/04/2022   Raynaud's disease 03/17/2022   Left facial swelling 01/25/2022   Left ear pain 01/23/2022   Right leg pain 10/15/2021   Microcytic anemia 07/07/2021   De Quervain's tenosynovitis, right 04/18/2021   Iron deficiency anemia 09/08/2020   Common bile duct dilatation 09/06/2020   Opacity of lung on imaging study 09/06/2020   Dizziness 07/10/2020   History of breast cancer 03/01/2020   Genetic testing 11/14/2019   Bilateral hand pain 11/03/2019   Family history of prostate cancer    Adnexal cyst 09/26/2019   Family history of breast cancer    Malignant neoplasm of upper-inner quadrant of right breast in female, estrogen receptor positive (HCC) 09/20/2018   B12 deficiency 04/13/2018   Joint pain 04/11/2018   Colon cancer screening 09/16/2017   Prediabetes 07/28/2016   Allergic rhinitis 11/13/2014   Screening for lipoid disorders 09/26/2014   Preventative health care 09/26/2014   Dysuria 08/07/2014   Routine general medical examination at a health care facility 10/28/2012   GERD 09/16/2010   Hypothyroidism 05/07/2008   Osteopenia 05/07/2008   Migraine with aura 11/11/2007    PCP: Judy Pimple MD  REFERRING PROVIDER: Juanda Chance, NP  REFERRING DIAG: M54.9,G89.29 (ICD-10-CM) - Chronic upper back pain M25.511,G89.29 (ICD-10-CM) - Chronic right shoulder pain M79.601 (ICD-10-CM) - Pain of right upper extremity M79.18 (ICD-10-CM) - Myofascial pain syndrome  THERAPY DIAG:  Cervicalgia  Pain in thoracic spine  Chronic right shoulder pain  Abnormal posture  Muscle weakness (generalized)  Rationale for Evaluation and Treatment: Rehabilitation  ONSET DATE: 01/21/2023  SUBJECTIVE:  SUBJECTIVE STATEMENT: She indicated doing pretty good overall.  Felt like needing a little bit of treatment to Rt upper trap today but not major.   PERTINENT HISTORY:  Arthritis, history of Rt breast cancer, DM, osteopenia, raynauds disease.  History of bilateral shoulder surgeries.   PAIN:  NPRS scale:mild  Pain location: Rt neck Pain description: headaches, tightness, sharp pain, throbbing.  Aggravating factors: intermittent, sometimes with movement.  Relieving factors: muscle relaxers, patches, TENS unit  PRECAUTIONS: None  WEIGHT BEARING RESTRICTIONS: No  FALLS:  Has patient fallen in last 6 months? Yes. Number of falls 1 fall No fear of falls reported.   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   03/01/2023 Increased sensitivity to light touch in Rt cervical, upper trap, posterior shoulder.  Trigger points noted in Rt upper trap, levator, infraspinatus, Rt cervical paraspinals.  Concordant symptoms noted with trigger points.   CERVICAL ROM:   ROM AROM (deg) 03/01/2023 AROM 03/08/2023  Flexion 55 c stretch feel 65  Extension 42 50  Right lateral flexion    Left lateral flexion    Right rotation 48 c Rt cervical pain 65  Left rotation 48 60   (Blank rows = not tested)  UPPER EXTREMITY MMT:  MMT  Right 03/01/2023 Left 03/01/2023 Right 03/22/2023 Left 03/22/2023  Shoulder flexion 5/5 5/5 5/5 5/5  Shoulder extension      Shoulder abduction 4+/5 4+/5 4+/5 4+/5  Shoulder adduction      Shoulder extension      Shoulder internal rotation 5/5 5/5 5/5 5/5  Shoulder external rotation 4/5 5/5 5/5 5/5  Elbow flexion      Elbow extension      Wrist flexion      Wrist extension      Wrist ulnar deviation      Wrist radial deviation      Wrist pronation      Wrist supination       (Blank rows = not tested)  UPPER EXTREMITY ROM:  ROM Right 03/01/2023 Left 03/01/2023  Shoulder flexion    Shoulder extension    Shoulder abduction    Shoulder adduction    Shoulder extension    Shoulder internal rotation    Shoulder external rotation    Middle trapezius    Lower trapezius    Elbow flexion    Elbow extension    Wrist flexion    Wrist extension    Wrist ulnar deviation    Wrist radial deviation    Wrist pronation    Wrist supination    Grip strength     (Blank rows = not tested)  CERVICAL SPECIAL TESTS:  03/01/2023 No specific testing today due to pain levels. (Check distraction benefits in future)  FUNCTIONAL TESTS:  03/01/2023 No specific testing today   TODAY'S TREATMENT:  DATE: 03/24/2023 Therex: UBE fwd/back 3 mins each way  Green tband rows x 20 Green tband GH ext x 20  Standing green band ER walk out reactive isometric hold 5 sec x 10 bilateral c towel under arm Standing green band IR x 20 bilateral c towel under arm Seated Rt upper trap self stretch 15 sec hold x 5  Seated scapular retraction c bilateral shoulder ER x 10 2-3 sec hold   Manual: Rt upper trap compression  Trigger Point Dry-Needling  Treatment instructions: Expect mild to moderate muscle soreness. S/S of pneumothorax if dry needled over a lung field, and to seek immediate medical attention should they occur. Patient verbalized  understanding of these instructions and education.  Patient Consent Given: Yes Education handout provided: Previously provided Muscles treated: Rt upper trap Treatment response/outcome: Concordant symptoms c twitch response.  Good tolerance overall   Moist heat 3 mins post dry needling,  in prone   TODAY'S TREATMENT:                                                                           DATE: 03/22/2023 Therex: UBE fwd/back 3 mins each way lvl 2.5 Tband green gh ext 3 sec hold past neutral x 15 with band set above shoulder height on attachment Standing green band ER to neutral with towel under arm 3 sec hold 2 x 15, performed bilaterally  Review of existing HEP and FOTO reassessment.   Manual: Supine cervical distraction intermittent    TODAY'S TREATMENT:                                                                           DATE: 03/17/2023 Therex:  Manual: Compression to Rt infraspinatus, Rt upper trap  for trigger point release.  Prone cPA , regional PA mid thoracic region g3.   Trigger Point Dry-Needling  Treatment instructions: Expect mild to moderate muscle soreness. S/S of pneumothorax if dry needled over a lung field, and to seek immediate medical attention should they occur. Patient verbalized understanding of these instructions and education.  Patient Consent Given: Yes Education handout provided: Previously provided Muscles treated: Rt infraspinatus, Rt upper trap  Treatment response/outcome: Concordant symptoms c twitch response.  Good tolerance overall   Modalities Pre mod to Rt upper trap, Rt shoulder in prone with moist heat 13 mins  TODAY'S TREATMENT:                                                                           DATE: 03/15/2023 Therex: UBE UE only 3 mins fwd/back each way lvl 2.5 fwd, 1.5 backward Prone scapular retraction 5 sec hold x 10 Prone scapular retraction  c GH ext 5 sec hold x 10 Prone scapular retraction c horizontal abduction 5  sec hold x 10 Seated Rt upper trap stretch 15 sec x 5 Seated Rt shoulder cross arm stretch 15 sec x 5  Manual: Compression to Rt infraspinatus, Rt upper trap  for trigger point release.  Prone cPA , regional PA mid thoracic region g3.   Trigger Point Dry-Needling  Treatment instructions: Expect mild to moderate muscle soreness. S/S of pneumothorax if dry needled over a lung field, and to seek immediate medical attention should they occur. Patient verbalized understanding of these instructions and education.  Patient Consent Given: Yes Education handout provided: Previously provided Muscles treated: Rt infraspinatus, Rt upper trap  Treatment response/outcome: Concordant symptoms c twitch response.  Good tolerance overall   Modalities Pre mod to Rt upper trap, Rt shoulder (anterior/posterior pad placement during self stretching)    PATIENT EDUCATION:  03/01/2023 Education details: HEP, POC, DN handout Person educated: Patient Education method: Explanation, Demonstration, Verbal cues, and Handouts Education comprehension: verbalized understanding, returned demonstration, and verbal cues required  HOME EXERCISE PROGRAM: Access Code: [email or web address] URL: [email or web address] Date: 03/08/2023 Prepared by: Chyrel Masson  Exercises - Seated Upper Trapezius Stretch  - 2 x daily - 7 x weekly - 1 sets - 5 reps - 15 hold - Gentle Levator Scapulae Stretch (Mirrored)  - 2 x daily - 7 x weekly - 1 sets - 5 reps - 15 hold - Standing Shoulder Posterior Capsule Stretch (Mirrored)  - 2 x daily - 7 x weekly - 1 sets - 5 reps - 15 hold - Seated Scapular Retraction  - 3-5 x daily - 7 x weekly - 1 sets - 10 reps - 3-5 hold - Standing Bilateral Low Shoulder Row with Anchored Resistance  - 1-2 x daily - 7 x weekly - 2-3 sets - 10-15 reps - Shoulder Extension with Resistance  - 1-2 x daily - 7 x weekly - 1-2 sets - 10-15 reps - Shoulder External Rotation and Scapular Retraction with Resistance   - 1-2 x daily - 7 x weekly - 1-2 sets - 10-15 reps  ASSESSMENT:  CLINICAL IMPRESSION: Mild amount of trigger points in Rt upper trap noted today (reduced compared to previous).  Twitch response was noted in dry needling and improvement in mobility was reported post activity.  Due to improvements, recommended reduced frequency trial of visits to assess response and move towards goals of HEP only.  Pt was in agreement.    OBJECTIVE IMPAIRMENTS: decreased activity tolerance, decreased endurance, decreased mobility, decreased ROM, decreased strength, increased fascial restrictions, impaired perceived functional ability, increased muscle spasms, impaired flexibility, impaired UE functional use, improper body mechanics, postural dysfunction, and pain.   ACTIVITY LIMITATIONS: carrying, lifting, bending, sitting, standing, sleeping, bed mobility, reach over head, and hygiene/grooming  PARTICIPATION LIMITATIONS: meal prep, cleaning, laundry, interpersonal relationship, driving, shopping, community activity, and occupation  PERSONAL FACTORS: Time since onset of injury/illness/exacerbation , severity of symptoms, history of Arthritis, history of Rt breast cancer, DM, osteopenia, raynauds diseaseare also affecting patient's functional outcome.   REHAB POTENTIAL: Good  CLINICAL DECISION MAKING: Stable/uncomplicated  EVALUATION COMPLEXITY: Low   GOALS: Goals reviewed with patient? Yes  SHORT TERM GOALS: (target date for Short term goals are 3 weeks 03/22/2023)  1.Patient will demonstrate independent use of home exercise program to maintain progress from in clinic treatments. Goal status: Met 03/08/2023  LONG TERM GOALS: (target dates for all long term goals are 12 weeks  05/24/2023 )  1. Patient will demonstrate/report pain at worst less than or equal to 2/10 to facilitate minimal limitation in daily activity secondary to pain symptoms. Goal status: on going 03/18/2023   2. Patient will demonstrate  independent use of home exercise program to facilitate ability to maintain/progress functional gains from skilled physical therapy services. Goal status: on going 03/18/2023   3. Patient will demonstrate FOTO outcome > or = 61 % to indicate reduced disability due to condition. Goal status: on going 03/18/2023   4.  Patient will demonstrate cervical AROM WFL s symptoms to facilitate usual head movements for daily activity including driving, self care.   Goal status: on going 03/18/2023   5.  Patient will demonstrate Rt shoulder MMT 5/5 throughout s symptoms to facilitate usual daily activity at PLOF.   Goal status: on going 03/18/2023   6.  Patient will demonstrate/report ability to sleep s symptoms disruptions.  Goal status: on going 03/18/2023   7.  Patient will demonstrate/report ability to return to gym workouts for general health considerations.  Goal Status: on going 03/18/2023   PLAN:  PT FREQUENCY: 1-2x/week  PT DURATION: 12 weeks  PLANNED INTERVENTIONS: Therapeutic exercises, Therapeutic activity, Neuro Muscular re-education, Balance training, Gait training, Patient/Family education, Joint mobilization, Stair training, DME instructions, Dry Needling, Electrical stimulation, Cryotherapy, vasopneumatic device,Traction, Moist heat, Taping, Ultrasound, Ionotophoresis 4mg /ml Dexamethasone, and aquatic therapy, Manual therapy.  All included unless contraindicated  PLAN FOR NEXT SESSION: Continue progression towards HEP trial as able.    Chyrel Masson, PT, DPT, OCS, ATC 03/24/23  11:39 AM    Referring diagnosis? M54.9,G89.29 (ICD-10-CM) - Chronic upper back pain Treatment diagnosis? (if different than referring diagnosis) M54.2 What was this (referring dx) caused by? []  Surgery [x]  Fall []  Ongoing issue []  Arthritis []  Other: ____________  Laterality: [x]  Rt []  Lt []  Both  Check all possible CPT codes:  *CHOOSE 10 OR LESS*    []  97110 (Therapeutic Exercise)  []  92507  (SLP Treatment)  []  97112 (Neuro Re-ed)   []  92526 (Swallowing Treatment)   []  97116 (Gait Training)   []  [id number] (Cognitive Training, 1st 15 minutes) []  97140 (Manual Therapy)   []  97130 (Cognitive Training, each add'l 15 minutes)  []  97164 (Re-evaluation)                              []  Other, List CPT Code ____________  []  97530 (Therapeutic Activities)     []  97535 (Self Care)   [x]  All codes above (97110 - 97535)  []  97012 (Mechanical Traction)  [x]  97014 (E-stim Unattended)  []  97032 (E-stim manual)  []  97033 (Ionto)  []  97035 (Ultrasound) [x]  97750 (Physical Performance Training) []  [id number] (Aquatic Therapy) []  97016 (Vasopneumatic Device) []  [id number] (Paraffin) []  97034 (Contrast Bath) []  97597 (Wound Care 1st 20 sq cm) []  97598 (Wound Care each add'l 20 sq cm) []  97760 (Orthotic Fabrication, Fitting, Training Initial) []  [id number] (Prosthetic Management and Training Initial) []  [id number] (Orthotic or Prosthetic Training/ Modification Subsequent)

## 2023-03-25 ENCOUNTER — Telehealth: Payer: Self-pay

## 2023-03-25 NOTE — Telephone Encounter (Signed)
Transition Care Management Unsuccessful Follow-up Telephone Call  Date of discharge and from where:  03/20/2023, [facility].  Attempts:  1st Attempt  Reason for unsuccessful TCM follow-up call:  Left voice message  Bethannie Iglehart [facility]  [facility] Population Health Community Resource Care Guide   [email or web address]  ?? [phone]   Website: [email or web address]  [email or web address]

## 2023-03-26 ENCOUNTER — Telehealth: Payer: Self-pay

## 2023-03-26 NOTE — Telephone Encounter (Signed)
Transition Care Management Unsuccessful Follow-up Telephone Call  Date of discharge and from where:  03/20/2023, [facility]  Attempts:  2nd Attempt  Reason for unsuccessful TCM follow-up call:  Left voice message  Jasilyn Holderman [facility]  [facility] Population Health Community Resource Care Guide   [email or web address]  ?? [phone]   Website: [email or web address]  [email or web address]

## 2023-03-29 ENCOUNTER — Ambulatory Visit: Payer: Medicare HMO | Admitting: Rehabilitative and Restorative Service Providers"

## 2023-03-29 ENCOUNTER — Encounter: Payer: Self-pay | Admitting: Rehabilitative and Restorative Service Providers"

## 2023-03-29 DIAGNOSIS — M6281 Muscle weakness (generalized): Secondary | ICD-10-CM | POA: Diagnosis not present

## 2023-03-29 DIAGNOSIS — M25511 Pain in right shoulder: Secondary | ICD-10-CM | POA: Diagnosis not present

## 2023-03-29 DIAGNOSIS — M542 Cervicalgia: Secondary | ICD-10-CM | POA: Diagnosis not present

## 2023-03-29 DIAGNOSIS — R293 Abnormal posture: Secondary | ICD-10-CM

## 2023-03-29 DIAGNOSIS — M546 Pain in thoracic spine: Secondary | ICD-10-CM | POA: Diagnosis not present

## 2023-03-29 DIAGNOSIS — G8929 Other chronic pain: Secondary | ICD-10-CM

## 2023-03-29 NOTE — Therapy (Signed)
OUTPATIENT PHYSICAL THERAPY TREATMENT   Patient Name: Jennifer [name] MRN: [id number] DOB:1955/12/23, 67 y.o., female Today's Date: 03/29/2023  END OF SESSION:  PT End of Session - 03/29/23 1117     Visit Number 9    Number of Visits 24    Date for PT Re-Evaluation 05/24/23    Authorization Type [employer] $25 copay    Authorization Time Period -05/31/2023    Authorization - Visit Number 9    Authorization - Number of Visits 12    Progress Note Due on Visit 10    PT Start Time 1104    PT Stop Time 1129    PT Time Calculation (min) 25 min    Activity Tolerance Patient limited by pain    Behavior During Therapy WFL for tasks assessed/performed                     Past Medical History:  Diagnosis Date   Allergic rhinitis    Allergy    seasonal   Anemia    Anxiety    Arthritis    hands, knees   Asthma    "as a child"   Blood transfusion without reported diagnosis    "as a child"   Breast cancer (HCC) 2019   Right Breast Cancer   Cancer (HCC) 09/2018   Breast CA new DX, right breast   Cervical dysplasia    Common bile duct dilation    Diabetes mellitus without complication (HCC)    Endometriosis    Esophageal reflux    Family history of adverse reaction to anesthesia    younger sister anxiety for a dew days after   Family history of breast cancer    Family history of breast cancer    Family history of prostate cancer    Hepatic cyst 08/2020   multiple   Hepatic hemangioma 08/2020   intrahepatic hemangioma   Hiatal hernia    HSV-1 (herpes simplex virus 1) infection    Hypertension    pt.denies 10/19/22   Hypothyroid    Migraine with aura, without mention of intractable migraine without mention of status migrainosus    Osteopenia 08/2019   T score -2.2 FRAX 11% / 1.7%   Personal history of radiation therapy 2019   Right Breast Cancer   Pre-diabetes    Raynaud's disease    Past Surgical History:  Procedure Laterality Date   BIOPSY  09/12/2020    Procedure: BIOPSY;  Surgeon: Rachael Fee, MD;  Location: [facility];  Service: Endoscopy;;   BREAST BIOPSY Left 11/27/2022   MM LT BREAST BX W LOC DEV 1ST LESION IMAGE BX SPEC STEREO GUIDE 11/27/2022 [facility]   BREAST EXCISIONAL BIOPSY Bilateral over 10 years ago   benign x 2    BREAST LUMPECTOMY Right 10/10/2018   BREAST LUMPECTOMY WITH RADIOACTIVE SEED AND SENTINEL LYMPH NODE BIOPSY Right 10/10/2018   Procedure: RIGHT BREAST LUMPECTOMY WITH RADIOACTIVE SEED AND RIGHT SENTINEL LYMPH NODE BIOPSY;  Surgeon: Chevis Pretty III, MD;  Location: [facility];  Service: General;  Laterality: Right;   BREAST SURGERY     Benign breast lump excised   CERVICAL CONE BIOPSY  1985   severe dysplasia   COLONOSCOPY  2005 and dec 2019   normal   COLPOSCOPY     endoscopy  10/2018   ESOPHAGOGASTRODUODENOSCOPY (EGD) WITH PROPOFOL N/A 09/12/2020   Procedure: ESOPHAGOGASTRODUODENOSCOPY (EGD) WITH PROPOFOL;  Surgeon: Rachael Fee, MD;  Location: [facility];  Service: Endoscopy;  Laterality: N/A;   EUS N/A 09/12/2020   Procedure: UPPER ENDOSCOPIC ULTRASOUND (EUS) RADIAL;  Surgeon: Rachael Fee, MD;  Location: [facility];  Service: Endoscopy;  Laterality: N/A;   LAPAROSCOPIC ASSISTED VAGINAL HYSTERECTOMY     LAPAROSCOPIC BILATERAL SALPINGO OOPHERECTOMY Bilateral 10/27/2019   Procedure: LAPAROSCOPIC BILATERAL SALPINGO OOPHORECTOMY WITH PERITONEAL WASHINGS;  Surgeon: Genia Del, MD;  Location: [facility];  Service: Gynecology;  Laterality: Bilateral;  request 1:00pm on Friday, Dec. 4th in [place] Iqueue time held requests one hour OR time   moles removed from upper back, face lft, 1 between breasts, upper leg left inside  10/18/2019   wearing small round bandaids on for 2 weeks   ROTATOR CUFF REPAIR Bilateral 08/2008   UPPER GASTROINTESTINAL ENDOSCOPY     Patient Active Problem List   Diagnosis Date Noted   Right hand pain 01/21/2023   Right  ankle pain 01/21/2023   Thoracic back pain 01/21/2023   Pain in right shoulder 08/12/2022   Hordeolum externum (stye) 08/04/2022   Raynaud's disease 03/17/2022   Left facial swelling 01/25/2022   Left ear pain 01/23/2022   Right leg pain 10/15/2021   Microcytic anemia 07/07/2021   De Quervain's tenosynovitis, right 04/18/2021   Iron deficiency anemia 09/08/2020   Common bile duct dilatation 09/06/2020   Opacity of lung on imaging study 09/06/2020   Dizziness 07/10/2020   History of breast cancer 03/01/2020   Genetic testing 11/14/2019   Bilateral hand pain 11/03/2019   Family history of prostate cancer    Adnexal cyst 09/26/2019   Family history of breast cancer    Malignant neoplasm of upper-inner quadrant of right breast in female, estrogen receptor positive (HCC) 09/20/2018   B12 deficiency 04/13/2018   Joint pain 04/11/2018   Colon cancer screening 09/16/2017   Prediabetes 07/28/2016   Allergic rhinitis 11/13/2014   Screening for lipoid disorders 09/26/2014   Preventative health care 09/26/2014   Dysuria 08/07/2014   Routine general medical examination at a health care facility 10/28/2012   GERD 09/16/2010   Hypothyroidism 05/07/2008   Osteopenia 05/07/2008   Migraine with aura 11/11/2007    PCP: Judy Pimple MD  REFERRING PROVIDER: Juanda Chance, NP  REFERRING DIAG: M54.9,G89.29 (ICD-10-CM) - Chronic upper back pain M25.511,G89.29 (ICD-10-CM) - Chronic right shoulder pain M79.601 (ICD-10-CM) - Pain of right upper extremity M79.18 (ICD-10-CM) - Myofascial pain syndrome  THERAPY DIAG:  Cervicalgia  Pain in thoracic spine  Chronic right shoulder pain  Abnormal posture  Muscle weakness (generalized)  Rationale for Evaluation and Treatment: Rehabilitation  ONSET DATE: 01/21/2023  SUBJECTIVE:  SUBJECTIVE STATEMENT: She indicated having increase in neck/shoulder and headaches this morning.  She stated today felt like a migraine day.   PERTINENT HISTORY:  Arthritis, history of Rt breast cancer, DM, osteopenia, raynauds disease.  History of bilateral shoulder surgeries.   PAIN:  NPRS scale:  upon arrival 2/10  Pain location: Rt neck Pain description: headaches, tightness, sharp pain, throbbing.  Aggravating factors: intermittent, sometimes with movement.  Relieving factors: muscle relaxers, patches, TENS unit  PRECAUTIONS: None  WEIGHT BEARING RESTRICTIONS: No  FALLS:  Has patient fallen in last 6 months? Yes. Number of falls 1 fall No fear of falls reported.   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   03/01/2023 Increased sensitivity to light touch in Rt cervical, upper trap, posterior shoulder.  Trigger points noted in Rt upper trap, levator, infraspinatus, Rt cervical paraspinals.  Concordant symptoms noted with trigger points.   CERVICAL ROM:   ROM AROM (deg) 03/01/2023 AROM 03/08/2023  Flexion 55 c stretch feel 65  Extension 42 50  Right lateral flexion    Left lateral flexion    Right rotation 48 c Rt cervical pain 65  Left rotation 48 60   (Blank rows = not tested)  UPPER EXTREMITY MMT:  MMT Right 03/01/2023  Left 03/01/2023 Right 03/22/2023 Left 03/22/2023  Shoulder flexion 5/5 5/5 5/5 5/5  Shoulder extension      Shoulder abduction 4+/5 4+/5 4+/5 4+/5  Shoulder adduction      Shoulder extension      Shoulder internal rotation 5/5 5/5 5/5 5/5  Shoulder external rotation 4/5 5/5 5/5 5/5  Elbow flexion      Elbow extension      Wrist flexion      Wrist extension      Wrist ulnar deviation      Wrist radial deviation      Wrist pronation      Wrist supination       (Blank rows = not tested)  UPPER EXTREMITY ROM:  ROM Right 03/01/2023 Left 03/01/2023  Shoulder flexion    Shoulder extension    Shoulder abduction    Shoulder adduction    Shoulder extension    Shoulder internal rotation    Shoulder external rotation    Middle trapezius    Lower trapezius    Elbow flexion    Elbow extension    Wrist flexion    Wrist extension    Wrist ulnar deviation    Wrist radial deviation    Wrist pronation    Wrist supination    Grip strength     (Blank rows = not tested)  CERVICAL SPECIAL TESTS:  03/01/2023 No specific testing today due to pain levels. (Check distraction benefits in future)  FUNCTIONAL TESTS:  03/01/2023 No specific testing today   TODAY'S TREATMENT:  DATE: 03/29/2023 Therex: Prone scapular retraction hold 5 sec x 10  Manual: Rt upper trap compression  Trigger Point Dry-Needling  Treatment instructions: Expect mild to moderate muscle soreness. S/S of pneumothorax if dry needled over a lung field, and to seek immediate medical attention should they occur. Patient verbalized understanding of these instructions and education.  Patient Consent Given: Yes Education handout provided: Previously provided Muscles treated: Rt upper trap Treatment response/outcome: Concordant symptoms c twitch response.  Good tolerance overall   Estim Prone pre mod to Rt upper trap region 10 mins c moist heat applied  post manua/dry needling   TODAY'S TREATMENT:                                                                           DATE: 03/24/2023 Therex: UBE fwd/back 3 mins each way  Green tband rows x 20 Green tband GH ext x 20  Standing green band ER walk out reactive isometric hold 5 sec x 10 bilateral c towel under arm Standing green band IR x 20 bilateral c towel under arm Seated Rt upper trap self stretch 15 sec hold x 5  Seated scapular retraction c bilateral shoulder ER x 10 2-3 sec hold   Manual: Rt upper trap compression  Trigger Point Dry-Needling  Treatment instructions: Expect mild to moderate muscle soreness. S/S of pneumothorax if dry needled over a lung field, and to seek immediate medical attention should they occur. Patient verbalized understanding of these instructions and education.  Patient Consent Given: Yes Education handout provided: Previously provided Muscles treated: Rt upper trap Treatment response/outcome: Concordant symptoms c twitch response.  Good tolerance overall   Moist heat 3 mins post dry needling,  in prone   TODAY'S TREATMENT:                                                                           DATE: 03/22/2023 Therex: UBE fwd/back 3 mins each way lvl 2.5 Tband green gh ext 3 sec hold past neutral x 15 with band set above shoulder height on attachment Standing green band ER to neutral with towel under arm 3 sec hold 2 x 15, performed bilaterally  Review of existing HEP and FOTO reassessment.   Manual: Supine cervical distraction intermittent    TODAY'S TREATMENT:                                                                           DATE: 03/17/2023 Therex:  Manual: Compression to Rt infraspinatus, Rt upper trap  for trigger point release.  Prone cPA , regional PA mid thoracic region g3.   Trigger Point Dry-Needling  Treatment instructions: Expect mild to  moderate muscle soreness. S/S of pneumothorax if dry needled over a lung field, and  to seek immediate medical attention should they occur. Patient verbalized understanding of these instructions and education.  Patient Consent Given: Yes Education handout provided: Previously provided Muscles treated: Rt infraspinatus, Rt upper trap  Treatment response/outcome: Concordant symptoms c twitch response.  Good tolerance overall   Modalities Pre mod to Rt upper trap, Rt shoulder in prone with moist heat 13 mins    PATIENT EDUCATION:  03/01/2023 Education details: HEP, POC, DN handout Person educated: Patient Education method: Explanation, Demonstration, Verbal cues, and Handouts Education comprehension: verbalized understanding, returned demonstration, and verbal cues required  HOME EXERCISE PROGRAM: Access Code: [email or web address] URL: [email or web address] Date: 03/08/2023 Prepared by: Chyrel Masson  Exercises - Seated Upper Trapezius Stretch  - 2 x daily - 7 x weekly - 1 sets - 5 reps - 15 hold - Gentle Levator Scapulae Stretch (Mirrored)  - 2 x daily - 7 x weekly - 1 sets - 5 reps - 15 hold - Standing Shoulder Posterior Capsule Stretch (Mirrored)  - 2 x daily - 7 x weekly - 1 sets - 5 reps - 15 hold - Seated Scapular Retraction  - 3-5 x daily - 7 x weekly - 1 sets - 10 reps - 3-5 hold - Standing Bilateral Low Shoulder Row with Anchored Resistance  - 1-2 x daily - 7 x weekly - 2-3 sets - 10-15 reps - Shoulder Extension with Resistance  - 1-2 x daily - 7 x weekly - 1-2 sets - 10-15 reps - Shoulder External Rotation and Scapular Retraction with Resistance  - 1-2 x daily - 7 x weekly - 1-2 sets - 10-15 reps  ASSESSMENT:  CLINICAL IMPRESSION: Due to symptom reporting upon arrival, focused today primarily on lowering symptoms response and reducing headache complaints.  Twitch responses noted in Rt upper trap but continued to be reduced in number/force with contract.  Encouraged continued use of HEP and adjusting based off symptoms (ie less resistance bands when  symptoms are present).   Decreased treatment time due to symptom management strategies today.    OBJECTIVE IMPAIRMENTS: decreased activity tolerance, decreased endurance, decreased mobility, decreased ROM, decreased strength, increased fascial restrictions, impaired perceived functional ability, increased muscle spasms, impaired flexibility, impaired UE functional use, improper body mechanics, postural dysfunction, and pain.   ACTIVITY LIMITATIONS: carrying, lifting, bending, sitting, standing, sleeping, bed mobility, reach over head, and hygiene/grooming  PARTICIPATION LIMITATIONS: meal prep, cleaning, laundry, interpersonal relationship, driving, shopping, community activity, and occupation  PERSONAL FACTORS: Time since onset of injury/illness/exacerbation , severity of symptoms, history of Arthritis, history of Rt breast cancer, DM, osteopenia, raynauds diseaseare also affecting patient's functional outcome.   REHAB POTENTIAL: Good  CLINICAL DECISION MAKING: Stable/uncomplicated  EVALUATION COMPLEXITY: Low   GOALS: Goals reviewed with patient? Yes  SHORT TERM GOALS: (target date for Short term goals are 3 weeks 03/22/2023)  1.Patient will demonstrate independent use of home exercise program to maintain progress from in clinic treatments. Goal status: Met 03/08/2023  LONG TERM GOALS: (target dates for all long term goals are 12 weeks  05/24/2023 )   1. Patient will demonstrate/report pain at worst less than or equal to 2/10 to facilitate minimal limitation in daily activity secondary to pain symptoms. Goal status: on going 03/18/2023   2. Patient will demonstrate independent use of home exercise program to facilitate ability to maintain/progress functional gains from skilled physical therapy services. Goal status: on going 03/18/2023   3. Patient will  demonstrate FOTO outcome > or = 61 % to indicate reduced disability due to condition. Goal status: on going 03/18/2023   4.  Patient  will demonstrate cervical AROM WFL s symptoms to facilitate usual head movements for daily activity including driving, self care.   Goal status: on going 03/18/2023   5.  Patient will demonstrate Rt shoulder MMT 5/5 throughout s symptoms to facilitate usual daily activity at PLOF.   Goal status: on going 03/18/2023   6.  Patient will demonstrate/report ability to sleep s symptoms disruptions.  Goal status: on going 03/18/2023   7.  Patient will demonstrate/report ability to return to gym workouts for general health considerations.  Goal Status: on going 03/18/2023   PLAN:  PT FREQUENCY: 1-2x/week  PT DURATION: 12 weeks  PLANNED INTERVENTIONS: Therapeutic exercises, Therapeutic activity, Neuro Muscular re-education, Balance training, Gait training, Patient/Family education, Joint mobilization, Stair training, DME instructions, Dry Needling, Electrical stimulation, Cryotherapy, vasopneumatic device,Traction, Moist heat, Taping, Ultrasound, Ionotophoresis 4mg /ml Dexamethasone, and aquatic therapy, Manual therapy.  All included unless contraindicated  PLAN FOR NEXT SESSION: Resume shoulder strengthening in clinic as able    Chyrel Masson, PT, DPT, OCS, ATC 03/29/23  11:26 AM    Referring diagnosis? M54.9,G89.29 (ICD-10-CM) - Chronic upper back pain Treatment diagnosis? (if different than referring diagnosis) M54.2 What was this (referring dx) caused by? []  Surgery [x]  Fall []  Ongoing issue []  Arthritis []  Other: ____________  Laterality: [x]  Rt []  Lt []  Both  Check all possible CPT codes:  *CHOOSE 10 OR LESS*    []  97110 (Therapeutic Exercise)  []  92507 (SLP Treatment)  []  97112 (Neuro Re-ed)   []  92526 (Swallowing Treatment)   []  97116 (Gait Training)   []  [id number] (Cognitive Training, 1st 15 minutes) []  97140 (Manual Therapy)   []  97130 (Cognitive Training, each add'l 15 minutes)  []  97164 (Re-evaluation)                              []  Other, List CPT Code ____________  []   97530 (Therapeutic Activities)     []  97535 (Self Care)   [x]  All codes above (97110 - 97535)  []  97012 (Mechanical Traction)  [x]  97014 (E-stim Unattended)  []  97032 (E-stim manual)  []  97033 (Ionto)  []  97035 (Ultrasound) [x]  78295 (Physical Performance Training) []  [id number] (Aquatic Therapy) []  97016 (Vasopneumatic Device) []  [id number] (Paraffin) []  97034 (Contrast Bath) []  97597 (Wound Care 1st 20 sq cm) []  97598 (Wound Care each add'l 20 sq cm) []  97760 (Orthotic Fabrication, Fitting, Training Initial) []  [id number] (Prosthetic Management and Training Initial) []  [id number] (Orthotic or Prosthetic Training/ Modification Subsequent)

## 2023-03-31 ENCOUNTER — Encounter: Payer: Medicare HMO | Admitting: Rehabilitative and Restorative Service Providers"

## 2023-04-01 ENCOUNTER — Telehealth: Payer: Self-pay

## 2023-04-01 DIAGNOSIS — G518 Other disorders of facial nerve: Secondary | ICD-10-CM | POA: Diagnosis not present

## 2023-04-01 DIAGNOSIS — M542 Cervicalgia: Secondary | ICD-10-CM | POA: Diagnosis not present

## 2023-04-01 DIAGNOSIS — M791 Myalgia, unspecified site: Secondary | ICD-10-CM | POA: Diagnosis not present

## 2023-04-01 DIAGNOSIS — G43719 Chronic migraine without aura, intractable, without status migrainosus: Secondary | ICD-10-CM | POA: Diagnosis not present

## 2023-04-01 NOTE — Progress Notes (Signed)
Called patient to reschedule her missed initial appointment with Lindsey Foltanski, PharmD. No answer; left message.  Lindsey Foltanski, PharmD notified  Nathanel Tallman, RMA Clinical Pharmacy Assistant [phone]   

## 2023-04-05 ENCOUNTER — Encounter: Payer: Medicare HMO | Admitting: Rehabilitative and Restorative Service Providers"

## 2023-04-09 ENCOUNTER — Encounter: Payer: Medicare HMO | Admitting: Rehabilitative and Restorative Service Providers"

## 2023-04-12 ENCOUNTER — Ambulatory Visit: Payer: Medicare HMO | Admitting: Rehabilitative and Restorative Service Providers"

## 2023-04-12 ENCOUNTER — Encounter: Payer: Self-pay | Admitting: Rehabilitative and Restorative Service Providers"

## 2023-04-12 DIAGNOSIS — G8929 Other chronic pain: Secondary | ICD-10-CM | POA: Diagnosis not present

## 2023-04-12 DIAGNOSIS — M6281 Muscle weakness (generalized): Secondary | ICD-10-CM | POA: Diagnosis not present

## 2023-04-12 DIAGNOSIS — R293 Abnormal posture: Secondary | ICD-10-CM

## 2023-04-12 DIAGNOSIS — M542 Cervicalgia: Secondary | ICD-10-CM

## 2023-04-12 DIAGNOSIS — M25511 Pain in right shoulder: Secondary | ICD-10-CM

## 2023-04-12 DIAGNOSIS — M546 Pain in thoracic spine: Secondary | ICD-10-CM | POA: Diagnosis not present

## 2023-04-12 NOTE — Therapy (Signed)
OUTPATIENT PHYSICAL THERAPY TREATMENT   Patient Name: SHAWNTAYA MCCRELESS MRN: [id number] DOB:01-Nov-1956, 67 y.o., female Today's Date: 04/12/2023  END OF SESSION:  PT End of Session - 04/12/23 1101     Visit Number 10    Number of Visits 24    Date for PT Re-Evaluation 05/24/23    Authorization Type [employer] $25 copay    Authorization Time Period -05/31/2023    Authorization - Number of Visits 12    Progress Note Due on Visit 12    PT Start Time 1059    PT Stop Time 1126    PT Time Calculation (min) 27 min    Activity Tolerance Patient tolerated treatment well    Behavior During Therapy WFL for tasks assessed/performed                      Past Medical History:  Diagnosis Date   Allergic rhinitis    Allergy    seasonal   Anemia    Anxiety    Arthritis    hands, knees   Asthma    "as a child"   Blood transfusion without reported diagnosis    "as a child"   Breast cancer (HCC) 2019   Right Breast Cancer   Cancer (HCC) 09/2018   Breast CA new DX, right breast   Cervical dysplasia    Common bile duct dilation    Diabetes mellitus without complication (HCC)    Endometriosis    Esophageal reflux    Family history of adverse reaction to anesthesia    younger sister anxiety for a dew days after   Family history of breast cancer    Family history of breast cancer    Family history of prostate cancer    Hepatic cyst 08/2020   multiple   Hepatic hemangioma 08/2020   intrahepatic hemangioma   Hiatal hernia    HSV-1 (herpes simplex virus 1) infection    Hypertension    pt.denies 10/19/22   Hypothyroid    Migraine with aura, without mention of intractable migraine without mention of status migrainosus    Osteopenia 08/2019   T score -2.2 FRAX 11% / 1.7%   Personal history of radiation therapy 2019   Right Breast Cancer   Pre-diabetes    Raynaud's disease    Past Surgical History:  Procedure Laterality Date   BIOPSY  09/12/2020   Procedure: BIOPSY;   Surgeon: Rachael Fee, MD;  Location: [facility];  Service: Endoscopy;;   BREAST BIOPSY Left 11/27/2022   MM LT BREAST BX W LOC DEV 1ST LESION IMAGE BX SPEC STEREO GUIDE 11/27/2022 [facility]   BREAST EXCISIONAL BIOPSY Bilateral over 10 years ago   benign x 2    BREAST LUMPECTOMY Right 10/10/2018   BREAST LUMPECTOMY WITH RADIOACTIVE SEED AND SENTINEL LYMPH NODE BIOPSY Right 10/10/2018   Procedure: RIGHT BREAST LUMPECTOMY WITH RADIOACTIVE SEED AND RIGHT SENTINEL LYMPH NODE BIOPSY;  Surgeon: Chevis Pretty III, MD;  Location: [facility];  Service: General;  Laterality: Right;   BREAST SURGERY     Benign breast lump excised   CERVICAL CONE BIOPSY  1985   severe dysplasia   COLONOSCOPY  2005 and dec 2019   normal   COLPOSCOPY     endoscopy  10/2018   ESOPHAGOGASTRODUODENOSCOPY (EGD) WITH PROPOFOL N/A 09/12/2020   Procedure: ESOPHAGOGASTRODUODENOSCOPY (EGD) WITH PROPOFOL;  Surgeon: Rachael Fee, MD;  Location: [facility];  Service: Endoscopy;  Laterality: N/A;  EUS N/A 09/12/2020   Procedure: UPPER ENDOSCOPIC ULTRASOUND (EUS) RADIAL;  Surgeon: Rachael Fee, MD;  Location: [facility];  Service: Endoscopy;  Laterality: N/A;   LAPAROSCOPIC ASSISTED VAGINAL HYSTERECTOMY     LAPAROSCOPIC BILATERAL SALPINGO OOPHERECTOMY Bilateral 10/27/2019   Procedure: LAPAROSCOPIC BILATERAL SALPINGO OOPHORECTOMY WITH PERITONEAL WASHINGS;  Surgeon: Genia Del, MD;  Location: [facility];  Service: Gynecology;  Laterality: Bilateral;  request 1:00pm on Friday, Dec. 4th in [place] Iqueue time held requests one hour OR time   moles removed from upper back, face lft, 1 between breasts, upper leg left inside  10/18/2019   wearing small round bandaids on for 2 weeks   ROTATOR CUFF REPAIR Bilateral 08/2008   UPPER GASTROINTESTINAL ENDOSCOPY     Patient Active Problem List   Diagnosis Date Noted   Right hand pain 01/21/2023   Right ankle pain 01/21/2023    Thoracic back pain 01/21/2023   Pain in right shoulder 08/12/2022   Hordeolum externum (stye) 08/04/2022   Raynaud's disease 03/17/2022   Left facial swelling 01/25/2022   Left ear pain 01/23/2022   Right leg pain 10/15/2021   Microcytic anemia 07/07/2021   De Quervain's tenosynovitis, right 04/18/2021   Iron deficiency anemia 09/08/2020   Common bile duct dilatation 09/06/2020   Opacity of lung on imaging study 09/06/2020   Dizziness 07/10/2020   History of breast cancer 03/01/2020   Genetic testing 11/14/2019   Bilateral hand pain 11/03/2019   Family history of prostate cancer    Adnexal cyst 09/26/2019   Family history of breast cancer    Malignant neoplasm of upper-inner quadrant of right breast in female, estrogen receptor positive (HCC) 09/20/2018   B12 deficiency 04/13/2018   Joint pain 04/11/2018   Colon cancer screening 09/16/2017   Prediabetes 07/28/2016   Allergic rhinitis 11/13/2014   Screening for lipoid disorders 09/26/2014   Preventative health care 09/26/2014   Dysuria 08/07/2014   Routine general medical examination at a health care facility 10/28/2012   GERD 09/16/2010   Hypothyroidism 05/07/2008   Osteopenia 05/07/2008   Migraine with aura 11/11/2007    PCP: Judy Pimple MD  REFERRING PROVIDER: Juanda Chance, NP  REFERRING DIAG: M54.9,G89.29 (ICD-10-CM) - Chronic upper back pain M25.511,G89.29 (ICD-10-CM) - Chronic right shoulder pain M79.601 (ICD-10-CM) - Pain of right upper extremity M79.18 (ICD-10-CM) - Myofascial pain syndrome  THERAPY DIAG:  Cervicalgia  Pain in thoracic spine  Chronic right shoulder pain  Abnormal posture  Muscle weakness (generalized)  Rationale for Evaluation and Treatment: Rehabilitation  ONSET DATE: 01/21/2023  SUBJECTIVE:  SUBJECTIVE STATEMENT: She indicated overall doing pretty good since last visit.  She had one time of feeling pain start to increase and use of HEP was able to reduce complaints.  She reported not gone 100% but overall doing better.   PERTINENT HISTORY:  Arthritis, history of Rt breast cancer, DM, osteopenia, raynauds disease.  History of bilateral shoulder surgeries.   PAIN:  NPRS scale:  upon arrival 2/10  Pain location: Rt neck Pain description: headaches, tightness, sharp pain, throbbing.  Aggravating factors: intermittent, sometimes with movement.  Relieving factors: muscle relaxers, patches, TENS unit  PRECAUTIONS: None  WEIGHT BEARING RESTRICTIONS: No  FALLS:  Has patient fallen in last 6 months? Yes. Number of falls 1 fall No fear of falls reported.   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   03/01/2023 Increased sensitivity to light touch in Rt cervical, upper trap, posterior shoulder.  Trigger points noted in Rt upper trap, levator, infraspinatus, Rt cervical paraspinals.  Concordant symptoms noted with trigger points.   CERVICAL ROM:   ROM AROM (deg) 03/01/2023 AROM 03/08/2023 AROM 04/12/2023  Flexion 55 c stretch feel 65 65  Extension 42 50 55  Right lateral flexion     Left lateral flexion     Right rotation 48 c  Rt cervical pain 65 74  Left rotation 48 60 66   (Blank rows = not tested)  UPPER EXTREMITY MMT:  MMT Right 03/01/2023 Left 03/01/2023 Right 03/22/2023 Left 03/22/2023  Shoulder flexion 5/5 5/5 5/5 5/5  Shoulder extension      Shoulder abduction 4+/5 4+/5 4+/5 4+/5  Shoulder adduction      Shoulder extension      Shoulder internal rotation 5/5 5/5 5/5 5/5  Shoulder external rotation 4/5 5/5 5/5 5/5  Elbow flexion      Elbow extension      Wrist flexion      Wrist extension      Wrist ulnar deviation      Wrist radial deviation      Wrist pronation      Wrist supination       (Blank rows = not tested)  UPPER EXTREMITY ROM:  ROM Right 03/01/2023 Left 03/01/2023  Shoulder flexion    Shoulder extension    Shoulder abduction    Shoulder adduction    Shoulder extension    Shoulder internal rotation    Shoulder external rotation    Middle trapezius    Lower trapezius    Elbow flexion    Elbow extension    Wrist flexion    Wrist extension    Wrist ulnar deviation    Wrist radial deviation    Wrist pronation    Wrist supination    Grip strength     (Blank rows = not tested)  CERVICAL SPECIAL TESTS:  03/01/2023 No specific testing today due to pain levels. (Check distraction benefits in future)  FUNCTIONAL  TESTS:  03/01/2023 No specific testing today   TODAY'S TREATMENT:                                                                           DATE: 04/12/2023 Therex: UBE fwd/back 4 mins each way , level 3.0 Cervical AROM extension/flexion, rotation x 2-3 each way  Additional time spent in review of existing HEP, cues for techniques and frequency/consistency encouragement upon transitioning to HEP.  Question and answer time given for improved confidence at home.   TODAY'S TREATMENT:                                                                           DATE: 03/29/2023 Therex: Prone scapular retraction hold 5 sec x 10  Manual: Rt upper trap compression  Trigger  Point Dry-Needling  Treatment instructions: Expect mild to moderate muscle soreness. S/S of pneumothorax if dry needled over a lung field, and to seek immediate medical attention should they occur. Patient verbalized understanding of these instructions and education.  Patient Consent Given: Yes Education handout provided: Previously provided Muscles treated: Rt upper trap Treatment response/outcome: Concordant symptoms c twitch response.  Good tolerance overall   Estim Prone pre mod to Rt upper trap region 10 mins c moist heat applied post manua/dry needling   TODAY'S TREATMENT:                                                                           DATE: 03/24/2023 Therex: UBE fwd/back 3 mins each way  Green tband rows x 20 Green tband GH ext x 20  Standing green band ER walk out reactive isometric hold 5 sec x 10 bilateral c towel under arm Standing green band IR x 20 bilateral c towel under arm Seated Rt upper trap self stretch 15 sec hold x 5  Seated scapular retraction c bilateral shoulder ER x 10 2-3 sec hold   Manual: Rt upper trap compression  Trigger Point Dry-Needling  Treatment instructions: Expect mild to moderate muscle soreness. S/S of pneumothorax if dry needled over a lung field, and to seek immediate medical attention should they occur. Patient verbalized understanding of these instructions and education.  Patient Consent Given: Yes Education handout provided: Previously provided Muscles treated: Rt upper trap Treatment response/outcome: Concordant symptoms c twitch response.  Good tolerance overall   Moist heat 3 mins post dry needling,  in prone   TODAY'S TREATMENT:  DATE: 03/22/2023 Therex: UBE fwd/back 3 mins each way lvl 2.5 Tband green gh ext 3 sec hold past neutral x 15 with band set above shoulder height on attachment Standing green band ER to neutral with towel under arm 3 sec hold  2 x 15, performed bilaterally  Review of existing HEP and FOTO reassessment.   Manual: Supine cervical distraction intermittent   PATIENT EDUCATION:  03/01/2023 Education details: HEP, POC, DN handout Person educated: Patient Education method: Explanation, Demonstration, Verbal cues, and Handouts Education comprehension: verbalized understanding, returned demonstration, and verbal cues required  HOME EXERCISE PROGRAM: Access Code: [email or web address] URL: [email or web address] Date: 03/08/2023 Prepared by: Chyrel Masson  Exercises - Seated Upper Trapezius Stretch  - 2 x daily - 7 x weekly - 1 sets - 5 reps - 15 hold - Gentle Levator Scapulae Stretch (Mirrored)  - 2 x daily - 7 x weekly - 1 sets - 5 reps - 15 hold - Standing Shoulder Posterior Capsule Stretch (Mirrored)  - 2 x daily - 7 x weekly - 1 sets - 5 reps - 15 hold - Seated Scapular Retraction  - 3-5 x daily - 7 x weekly - 1 sets - 10 reps - 3-5 hold - Standing Bilateral Low Shoulder Row with Anchored Resistance  - 1-2 x daily - 7 x weekly - 2-3 sets - 10-15 reps - Shoulder Extension with Resistance  - 1-2 x daily - 7 x weekly - 1-2 sets - 10-15 reps - Shoulder External Rotation and Scapular Retraction with Resistance  - 1-2 x daily - 7 x weekly - 1-2 sets - 10-15 reps  ASSESSMENT:  CLINICAL IMPRESSION: Pt returned today after 14 days with good overall results in management of symptoms with HEP at this time.  Recommended trial HEP at this time due to overall improvements.  Pt was in agreement with plan.  Improvements noted in cervical mobility, FOTO score, symptom reduction to this point.  Attended 10 visits overall in treatment cycle to this point.   OBJECTIVE IMPAIRMENTS: decreased activity tolerance, decreased endurance, decreased mobility, decreased ROM, decreased strength, increased fascial restrictions, impaired perceived functional ability, increased muscle spasms, impaired flexibility, impaired UE functional use,  improper body mechanics, postural dysfunction, and pain.   ACTIVITY LIMITATIONS: carrying, lifting, bending, sitting, standing, sleeping, bed mobility, reach over head, and hygiene/grooming  PARTICIPATION LIMITATIONS: meal prep, cleaning, laundry, interpersonal relationship, driving, shopping, community activity, and occupation  PERSONAL FACTORS: Time since onset of injury/illness/exacerbation , severity of symptoms, history of Arthritis, history of Rt breast cancer, DM, osteopenia, raynauds diseaseare also affecting patient's functional outcome.   REHAB POTENTIAL: Good  CLINICAL DECISION MAKING: Stable/uncomplicated  EVALUATION COMPLEXITY: Low   GOALS: Goals reviewed with patient? Yes  SHORT TERM GOALS: (target date for Short term goals are 3 weeks 03/22/2023)  1.Patient will demonstrate independent use of home exercise program to maintain progress from in clinic treatments. Goal status: Met 03/08/2023  LONG TERM GOALS: (target dates for all long term goals are 12 weeks  05/24/2023 )   1. Patient will demonstrate/report pain at worst less than or equal to 2/10 to facilitate minimal limitation in daily activity secondary to pain symptoms. Goal status: Mostly met 04/12/2023   2. Patient will demonstrate independent use of home exercise program to facilitate ability to maintain/progress functional gains from skilled physical therapy services. Goal status: Met 04/12/2023   3. Patient will demonstrate FOTO outcome > or = 61 % to indicate reduced disability due to condition. Goal status: met 04/12/2023  4.  Patient will demonstrate cervical AROM WFL s symptoms to facilitate usual head movements for daily activity including driving, self care.   Goal status:Mostly met 04/12/2023   5.  Patient will demonstrate Rt shoulder MMT 5/5 throughout s symptoms to facilitate usual daily activity at PLOF.   Goal status:Mostly met 04/12/2023   6.  Patient will demonstrate/report ability to sleep s  symptoms disruptions.  Goal status:  on going 04/12/2023   7.  Patient will demonstrate/report ability to return to gym workouts for general health considerations.  Goal Status: on going 04/12/2023   PLAN:  PT FREQUENCY: 1-2x/week  PT DURATION: 12 weeks  PLANNED INTERVENTIONS: Therapeutic exercises, Therapeutic activity, Neuro Muscular re-education, Balance training, Gait training, Patient/Family education, Joint mobilization, Stair training, DME instructions, Dry Needling, Electrical stimulation, Cryotherapy, vasopneumatic device,Traction, Moist heat, Taping, Ultrasound, Ionotophoresis 4mg /ml Dexamethasone, and aquatic therapy, Manual therapy.  All included unless contraindicated  PLAN FOR NEXT SESSION: Trial HEP period. [employer] good until 05/31/2023 (2 more visits available)   Chyrel Masson, PT, DPT, OCS, ATC 04/12/23  11:31 AM    Referring diagnosis? M54.9,G89.29 (ICD-10-CM) - Chronic upper back pain Treatment diagnosis? (if different than referring diagnosis) M54.2 What was this (referring dx) caused by? []  Surgery [x]  Fall []  Ongoing issue []  Arthritis []  Other: ____________  Laterality: [x]  Rt []  Lt []  Both  Check all possible CPT codes:  *CHOOSE 10 OR LESS*    []  97110 (Therapeutic Exercise)  []  92507 (SLP Treatment)  []  97112 (Neuro Re-ed)   []  92526 (Swallowing Treatment)   []  97116 (Gait Training)   []  [id number] (Cognitive Training, 1st 15 minutes) []  97140 (Manual Therapy)   []  97130 (Cognitive Training, each add'l 15 minutes)  []  97164 (Re-evaluation)                              []  Other, List CPT Code ____________  []  97530 (Therapeutic Activities)     []  97535 (Self Care)   [x]  All codes above (97110 - 97535)  []  97012 (Mechanical Traction)  [x]  97014 (E-stim Unattended)  []  97032 (E-stim manual)  []  97033 (Ionto)  []  97035 (Ultrasound) [x]  97750 (Physical Performance Training) []  [id number] (Aquatic Therapy) []  97016 (Vasopneumatic Device) []  [id number]  (Paraffin) []  09811 (Contrast Bath) []  97597 (Wound Care 1st 20 sq cm) []  97598 (Wound Care each add'l 20 sq cm) []  97760 (Orthotic Fabrication, Fitting, Training Initial) []  [id number] (Prosthetic Management and Training Initial) []  [id number] (Orthotic or Prosthetic Training/ Modification Subsequent)

## 2023-04-14 ENCOUNTER — Encounter: Payer: Medicare HMO | Admitting: Rehabilitative and Restorative Service Providers"

## 2023-04-16 DIAGNOSIS — M542 Cervicalgia: Secondary | ICD-10-CM | POA: Diagnosis not present

## 2023-04-16 DIAGNOSIS — G518 Other disorders of facial nerve: Secondary | ICD-10-CM | POA: Diagnosis not present

## 2023-04-16 DIAGNOSIS — G43719 Chronic migraine without aura, intractable, without status migrainosus: Secondary | ICD-10-CM | POA: Diagnosis not present

## 2023-04-16 DIAGNOSIS — M791 Myalgia, unspecified site: Secondary | ICD-10-CM | POA: Diagnosis not present

## 2023-04-20 ENCOUNTER — Encounter: Payer: Medicare HMO | Admitting: Physical Therapy

## 2023-04-20 ENCOUNTER — Telehealth: Payer: Self-pay

## 2023-04-20 NOTE — Progress Notes (Signed)
Called patient to reschedule missed initial appointment with Al Corpus, PharmD on 03/16/2023. No answer; left message.  Al Corpus, PharmD notified  Claudina Lick, [place] Clinical Pharmacy Assistant [phone]

## 2023-04-23 ENCOUNTER — Encounter: Payer: Medicare HMO | Admitting: Physical Therapy

## 2023-04-26 ENCOUNTER — Encounter: Payer: Self-pay | Admitting: Rehabilitative and Restorative Service Providers"

## 2023-04-26 ENCOUNTER — Ambulatory Visit: Payer: Medicare HMO | Admitting: Rehabilitative and Restorative Service Providers"

## 2023-04-26 DIAGNOSIS — M546 Pain in thoracic spine: Secondary | ICD-10-CM | POA: Diagnosis not present

## 2023-04-26 DIAGNOSIS — G8929 Other chronic pain: Secondary | ICD-10-CM

## 2023-04-26 DIAGNOSIS — R293 Abnormal posture: Secondary | ICD-10-CM

## 2023-04-26 DIAGNOSIS — M6281 Muscle weakness (generalized): Secondary | ICD-10-CM

## 2023-04-26 DIAGNOSIS — M25511 Pain in right shoulder: Secondary | ICD-10-CM | POA: Diagnosis not present

## 2023-04-26 DIAGNOSIS — M542 Cervicalgia: Secondary | ICD-10-CM | POA: Diagnosis not present

## 2023-04-26 NOTE — Therapy (Addendum)
OUTPATIENT PHYSICAL THERAPY TREATMENT /DISCHARGE   Patient Name: Jennifer [name] MRN: [id number] DOB:18-Dec-1955, 67 y.o., female Today's Date: 04/26/2023  END OF SESSION:  PT End of Session - 04/26/23 1303     Visit Number 11    Number of Visits 24    Date for PT Re-Evaluation 05/24/23    Authorization Type [employer] $25 copay    Authorization Time Period -05/31/2023    Authorization - Visit Number 11    Authorization - Number of Visits 12    Progress Note Due on Visit 12    PT Start Time 1304    PT Stop Time 1329    PT Time Calculation (min) 25 min    Activity Tolerance Patient tolerated treatment well    Behavior During Therapy WFL for tasks assessed/performed                       Past Medical History:  Diagnosis Date   Allergic rhinitis    Allergy    seasonal   Anemia    Anxiety    Arthritis    hands, knees   Asthma    "as a child"   Blood transfusion without reported diagnosis    "as a child"   Breast cancer (HCC) 2019   Right Breast Cancer   Cancer (HCC) 09/2018   Breast CA new DX, right breast   Cervical dysplasia    Common bile duct dilation    Diabetes mellitus without complication (HCC)    Endometriosis    Esophageal reflux    Family history of adverse reaction to anesthesia    younger sister anxiety for a dew days after   Family history of breast cancer    Family history of breast cancer    Family history of prostate cancer    Hepatic cyst 08/2020   multiple   Hepatic hemangioma 08/2020   intrahepatic hemangioma   Hiatal hernia    HSV-1 (herpes simplex virus 1) infection    Hypertension    pt.denies 10/19/22   Hypothyroid    Migraine with aura, without mention of intractable migraine without mention of status migrainosus    Osteopenia 08/2019   T score -2.2 FRAX 11% / 1.7%   Personal history of radiation therapy 2019   Right Breast Cancer   Pre-diabetes    Raynaud's disease    Past Surgical History:  Procedure Laterality  Date   BIOPSY  09/12/2020   Procedure: BIOPSY;  Surgeon: Rachael Fee, MD;  Location: [facility];  Service: Endoscopy;;   BREAST BIOPSY Left 11/27/2022   MM LT BREAST BX W LOC DEV 1ST LESION IMAGE BX SPEC STEREO GUIDE 11/27/2022 [facility]   BREAST EXCISIONAL BIOPSY Bilateral over 10 years ago   benign x 2    BREAST LUMPECTOMY Right 10/10/2018   BREAST LUMPECTOMY WITH RADIOACTIVE SEED AND SENTINEL LYMPH NODE BIOPSY Right 10/10/2018   Procedure: RIGHT BREAST LUMPECTOMY WITH RADIOACTIVE SEED AND RIGHT SENTINEL LYMPH NODE BIOPSY;  Surgeon: Chevis Pretty III, MD;  Location: [facility];  Service: General;  Laterality: Right;   BREAST SURGERY     Benign breast lump excised   CERVICAL CONE BIOPSY  1985   severe dysplasia   COLONOSCOPY  2005 and dec 2019   normal   COLPOSCOPY     endoscopy  10/2018   ESOPHAGOGASTRODUODENOSCOPY (EGD) WITH PROPOFOL N/A 09/12/2020   Procedure: ESOPHAGOGASTRODUODENOSCOPY (EGD) WITH PROPOFOL;  Surgeon: Rachael Fee, MD;  Location: [facility];  Service: Endoscopy;  Laterality: N/A;   EUS N/A 09/12/2020   Procedure: UPPER ENDOSCOPIC ULTRASOUND (EUS) RADIAL;  Surgeon: Rachael Fee, MD;  Location: [facility];  Service: Endoscopy;  Laterality: N/A;   LAPAROSCOPIC ASSISTED VAGINAL HYSTERECTOMY     LAPAROSCOPIC BILATERAL SALPINGO OOPHERECTOMY Bilateral 10/27/2019   Procedure: LAPAROSCOPIC BILATERAL SALPINGO OOPHORECTOMY WITH PERITONEAL WASHINGS;  Surgeon: Genia Del, MD;  Location: [facility];  Service: Gynecology;  Laterality: Bilateral;  request 1:00pm on Friday, Dec. 4th in [place] Iqueue time held requests one hour OR time   moles removed from upper back, face lft, 1 between breasts, upper leg left inside  10/18/2019   wearing small round bandaids on for 2 weeks   ROTATOR CUFF REPAIR Bilateral 08/2008   UPPER GASTROINTESTINAL ENDOSCOPY     Patient Active Problem List   Diagnosis Date Noted   Right  hand pain 01/21/2023   Right ankle pain 01/21/2023   Thoracic back pain 01/21/2023   Pain in right shoulder 08/12/2022   Hordeolum externum (stye) 08/04/2022   Raynaud's disease 03/17/2022   Left facial swelling 01/25/2022   Left ear pain 01/23/2022   Right leg pain 10/15/2021   Microcytic anemia 07/07/2021   De Quervain's tenosynovitis, right 04/18/2021   Iron deficiency anemia 09/08/2020   Common bile duct dilatation 09/06/2020   Opacity of lung on imaging study 09/06/2020   Dizziness 07/10/2020   History of breast cancer 03/01/2020   Genetic testing 11/14/2019   Bilateral hand pain 11/03/2019   Family history of prostate cancer    Adnexal cyst 09/26/2019   Family history of breast cancer    Malignant neoplasm of upper-inner quadrant of right breast in female, estrogen receptor positive (HCC) 09/20/2018   B12 deficiency 04/13/2018   Joint pain 04/11/2018   Colon cancer screening 09/16/2017   Prediabetes 07/28/2016   Allergic rhinitis 11/13/2014   Screening for lipoid disorders 09/26/2014   Preventative health care 09/26/2014   Dysuria 08/07/2014   Routine general medical examination at a health care facility 10/28/2012   GERD 09/16/2010   Hypothyroidism 05/07/2008   Osteopenia 05/07/2008   Migraine with aura 11/11/2007    PCP: Judy Pimple MD  REFERRING PROVIDER: Juanda Chance, NP  REFERRING DIAG: M54.9,G89.29 (ICD-10-CM) - Chronic upper back pain M25.511,G89.29 (ICD-10-CM) - Chronic right shoulder pain M79.601 (ICD-10-CM) - Pain of right upper extremity M79.18 (ICD-10-CM) - Myofascial pain syndrome  THERAPY DIAG:  Cervicalgia  Pain in thoracic spine  Chronic right shoulder pain  Abnormal posture  Muscle weakness (generalized)  Rationale for Evaluation and Treatment: Rehabilitation  ONSET DATE: 01/21/2023  SUBJECTIVE:  SUBJECTIVE STATEMENT: She reported feeling bad after lots of moving things and sleeping not in her bed which has led to the increase.  Reported having soft tissue work prior to coming into today.  She indicated she is packing for trip.   Had two migraines in last week.    PERTINENT HISTORY:  Arthritis, history of Rt breast cancer, DM, osteopenia, raynauds disease.  History of bilateral shoulder surgeries.   PAIN:  NPRS scale:  upon arrival 1/10, at worst in last day or so 3/10 Pain location: Rt neck/shoulder  Pain description: headaches, tightness, sharp pain, throbbing.  Aggravating factors: intermittent, sometimes with movement.  Relieving factors: muscle relaxers, patches, TENS unit  PRECAUTIONS: None  WEIGHT BEARING RESTRICTIONS: No  FALLS:  Has patient fallen in last 6 months? Yes. Number of falls 1 fall No fear of falls reported.   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)  PLOF: Independent, Rt hand dominant, gardening, workouts (limited due to symptoms) has gym membership  PATIENT GOALS: Reduce pain, get back to exercise   OBJECTIVE:   PATIENT SURVEYS:  04/12/2023:  update:  84  03/22/2023: update 73  03/01/2023 FOTO intake:51    predicted:  61  COGNITION: 03/01/2023 Overall cognitive status: Within functional limits for tasks assessed  SENSATION: 03/01/2023 WFL  POSTURE: 03/01/2023  rounded shoulders, forward head, and increased thoracic kyphosis in sitting   PALPATION: 04/26/2023:  Trigger points noted in Rt upper trap and infraspinatus  03/12/2023:  Hypomobility in mid thoracic cPA assessment.   03/01/2023 Increased sensitivity to light touch in Rt cervical, upper trap, posterior shoulder.  Trigger points noted in Rt upper trap, levator, infraspinatus, Rt cervical paraspinals.  Concordant symptoms noted with trigger points.    CERVICAL ROM:   ROM AROM (deg) 03/01/2023 AROM 03/08/2023 AROM 04/12/2023  Flexion 55 c stretch feel 65 65  Extension 42 50 55  Right lateral flexion     Left lateral flexion     Right rotation 48 c Rt cervical pain 65 74  Left rotation 48 60 66   (Blank rows = not tested)  UPPER EXTREMITY MMT:  MMT Right 03/01/2023 Left 03/01/2023 Right 03/22/2023 Left 03/22/2023  Shoulder flexion 5/5 5/5 5/5 5/5  Shoulder extension      Shoulder abduction 4+/5 4+/5 4+/5 4+/5  Shoulder adduction      Shoulder extension      Shoulder internal rotation 5/5 5/5 5/5 5/5  Shoulder external rotation 4/5 5/5 5/5 5/5  Elbow flexion      Elbow extension      Wrist flexion      Wrist extension      Wrist ulnar deviation      Wrist radial deviation      Wrist pronation      Wrist supination       (Blank rows = not tested)  UPPER EXTREMITY ROM:  ROM Right 03/01/2023 Left 03/01/2023  Shoulder flexion    Shoulder extension    Shoulder abduction    Shoulder adduction    Shoulder extension    Shoulder internal rotation    Shoulder external rotation    Middle trapezius    Lower trapezius    Elbow flexion    Elbow extension    Wrist flexion    Wrist extension    Wrist ulnar deviation    Wrist radial deviation    Wrist pronation    Wrist supination    Grip strength     (  Blank rows = not tested)  CERVICAL SPECIAL TESTS:  03/01/2023 No specific testing today due to pain levels. (Check distraction benefits in future)  FUNCTIONAL TESTS:  03/01/2023 No specific testing today   TODAY'S TREATMENT:                                                                           DATE: 04/26/2023  Manual: Rt upper trap compression  Trigger Point Dry-Needling  Treatment instructions: Expect mild to moderate muscle soreness. S/S of pneumothorax if dry needled over a lung field, and to seek immediate medical attention should they occur. Patient verbalized understanding of these instructions and  education.  Patient Consent Given: Yes Education handout provided: Previously provided Muscles treated: Rt upper trap Treatment response/outcome: Concordant symptoms c twitch response.  Good tolerance overall   Estim Prone pre mod to Rt upper trap region 10 mins c moist heat applied post manual/dry needling   TODAY'S TREATMENT:                                                                           DATE: 04/12/2023 Therex: UBE fwd/back 4 mins each way , level 3.0 Cervical AROM extension/flexion, rotation x 2-3 each way  Additional time spent in review of existing HEP, cues for techniques and frequency/consistency encouragement upon transitioning to HEP.  Question and answer time given for improved confidence at home.   TODAY'S TREATMENT:                                                                           DATE: 03/29/2023 Therex: Prone scapular retraction hold 5 sec x 10  Manual: Rt upper trap compression  Trigger Point Dry-Needling  Treatment instructions: Expect mild to moderate muscle soreness. S/S of pneumothorax if dry needled over a lung field, and to seek immediate medical attention should they occur. Patient verbalized understanding of these instructions and education.  Patient Consent Given: Yes Education handout provided: Previously provided Muscles treated: Rt upper trap Treatment response/outcome: Concordant symptoms c twitch response.  Good tolerance overall   Estim Prone pre mod to Rt upper trap region 10 mins c moist heat applied post manua/dry needling   TODAY'S TREATMENT:                                                                           DATE: 03/24/2023 Therex: UBE fwd/back 3 mins  each way  Green tband rows x 20 Green tband GH ext x 20  Standing green band ER walk out reactive isometric hold 5 sec x 10 bilateral c towel under arm Standing green band IR x 20 bilateral c towel under arm Seated Rt upper trap self stretch 15 sec hold x 5  Seated  scapular retraction c bilateral shoulder ER x 10 2-3 sec hold   Manual: Rt upper trap compression  Trigger Point Dry-Needling  Treatment instructions: Expect mild to moderate muscle soreness. S/S of pneumothorax if dry needled over a lung field, and to seek immediate medical attention should they occur. Patient verbalized understanding of these instructions and education.  Patient Consent Given: Yes Education handout provided: Previously provided Muscles treated: Rt upper trap Treatment response/outcome: Concordant symptoms c twitch response.  Good tolerance overall   Moist heat 3 mins post dry needling,  in prone   PATIENT EDUCATION:  03/01/2023 Education details: HEP, POC, DN handout Person educated: Patient Education method: Explanation, Demonstration, Verbal cues, and Handouts Education comprehension: verbalized understanding, returned demonstration, and verbal cues required  HOME EXERCISE PROGRAM: Access Code: [email or web address] URL: [email or web address] Date: 03/08/2023 Prepared by: Chyrel Masson  Exercises - Seated Upper Trapezius Stretch  - 2 x daily - 7 x weekly - 1 sets - 5 reps - 15 hold - Gentle Levator Scapulae Stretch (Mirrored)  - 2 x daily - 7 x weekly - 1 sets - 5 reps - 15 hold - Standing Shoulder Posterior Capsule Stretch (Mirrored)  - 2 x daily - 7 x weekly - 1 sets - 5 reps - 15 hold - Seated Scapular Retraction  - 3-5 x daily - 7 x weekly - 1 sets - 10 reps - 3-5 hold - Standing Bilateral Low Shoulder Row with Anchored Resistance  - 1-2 x daily - 7 x weekly - 2-3 sets - 10-15 reps - Shoulder Extension with Resistance  - 1-2 x daily - 7 x weekly - 1-2 sets - 10-15 reps - Shoulder External Rotation and Scapular Retraction with Resistance  - 1-2 x daily - 7 x weekly - 1-2 sets - 10-15 reps  ASSESSMENT:  CLINICAL IMPRESSION: Arrival today with mild increases in symptoms per pain scale.  Similar presentation of Rt upper trap trigger points/tightness with  myofascial pain complaints.  Treated with manual and needling with post intervention estim/heat for symptom relief.  Quick review of HEP showed good knowledge recall.    OBJECTIVE IMPAIRMENTS: decreased activity tolerance, decreased endurance, decreased mobility, decreased ROM, decreased strength, increased fascial restrictions, impaired perceived functional ability, increased muscle spasms, impaired flexibility, impaired UE functional use, improper body mechanics, postural dysfunction, and pain.   ACTIVITY LIMITATIONS: carrying, lifting, bending, sitting, standing, sleeping, bed mobility, reach over head, and hygiene/grooming  PARTICIPATION LIMITATIONS: meal prep, cleaning, laundry, interpersonal relationship, driving, shopping, community activity, and occupation  PERSONAL FACTORS: Time since onset of injury/illness/exacerbation , severity of symptoms, history of Arthritis, history of Rt breast cancer, DM, osteopenia, raynauds diseaseare also affecting patient's functional outcome.   REHAB POTENTIAL: Good  CLINICAL DECISION MAKING: Stable/uncomplicated  EVALUATION COMPLEXITY: Low   GOALS: Goals reviewed with patient? Yes  SHORT TERM GOALS: (target date for Short term goals are 3 weeks 03/22/2023)  1.Patient will demonstrate independent use of home exercise program to maintain progress from in clinic treatments. Goal status: Met 03/08/2023  LONG TERM GOALS: (target dates for all long term goals are 12 weeks  05/24/2023 )   1. Patient will demonstrate/report pain at worst  less than or equal to 2/10 to facilitate minimal limitation in daily activity secondary to pain symptoms. Goal status: Mostly met 04/12/2023   2. Patient will demonstrate independent use of home exercise program to facilitate ability to maintain/progress functional gains from skilled physical therapy services. Goal status: Met 04/12/2023   3. Patient will demonstrate FOTO outcome > or = 61 % to indicate reduced disability  due to condition. Goal status: met 04/12/2023   4.  Patient will demonstrate cervical AROM WFL s symptoms to facilitate usual head movements for daily activity including driving, self care.   Goal status:Mostly met 04/12/2023   5.  Patient will demonstrate Rt shoulder MMT 5/5 throughout s symptoms to facilitate usual daily activity at PLOF.   Goal status:Mostly met 04/12/2023   6.  Patient will demonstrate/report ability to sleep s symptoms disruptions.  Goal status:  on going 04/12/2023   7.  Patient will demonstrate/report ability to return to gym workouts for general health considerations.  Goal Status: on going 04/12/2023   PLAN:  PT FREQUENCY: 1-2x/week  PT DURATION: 12 weeks  PLANNED INTERVENTIONS: Therapeutic exercises, Therapeutic activity, Neuro Muscular re-education, Balance training, Gait training, Patient/Family education, Joint mobilization, Stair training, DME instructions, Dry Needling, Electrical stimulation, Cryotherapy, vasopneumatic device,Traction, Moist heat, Taping, Ultrasound, Ionotophoresis 4mg /ml Dexamethasone, and aquatic therapy, Manual therapy.  All included unless contraindicated  PLAN FOR NEXT SESSION:[employer] good until 05/31/2023 (1 more visits available)   Chyrel Masson, PT, DPT, OCS, ATC 04/26/23  1:19 PM   PHYSICAL THERAPY DISCHARGE SUMMARY  Visits from Start of Care: 11  Current functional level related to goals / functional outcomes: See note   Remaining deficits: See note   Education / Equipment: HEP  Patient goals were partially met. Patient is being discharged due to not returning since the last visit.  Chyrel Masson, PT, DPT, OCS, ATC 06/02/23  2:05 PM

## 2023-05-02 DIAGNOSIS — T781XXA Other adverse food reactions, not elsewhere classified, initial encounter: Secondary | ICD-10-CM | POA: Diagnosis not present

## 2023-05-02 DIAGNOSIS — T7800XA Anaphylactic reaction due to unspecified food, initial encounter: Secondary | ICD-10-CM | POA: Diagnosis not present

## 2023-05-13 DIAGNOSIS — G518 Other disorders of facial nerve: Secondary | ICD-10-CM | POA: Diagnosis not present

## 2023-05-13 DIAGNOSIS — G43019 Migraine without aura, intractable, without status migrainosus: Secondary | ICD-10-CM | POA: Diagnosis not present

## 2023-05-13 DIAGNOSIS — G43719 Chronic migraine without aura, intractable, without status migrainosus: Secondary | ICD-10-CM | POA: Diagnosis not present

## 2023-05-13 DIAGNOSIS — M791 Myalgia, unspecified site: Secondary | ICD-10-CM | POA: Diagnosis not present

## 2023-05-13 DIAGNOSIS — M542 Cervicalgia: Secondary | ICD-10-CM | POA: Diagnosis not present

## 2023-05-21 ENCOUNTER — Other Ambulatory Visit: Payer: Self-pay

## 2023-05-21 ENCOUNTER — Emergency Department
Admission: EM | Admit: 2023-05-21 | Discharge: 2023-05-21 | Disposition: A | Discharge status: Home / Self Care | Payer: Medicare HMO | Attending: Emergency Medicine | Admitting: Emergency Medicine

## 2023-05-21 DIAGNOSIS — G43009 Migraine without aura, not intractable, without status migrainosus: Secondary | ICD-10-CM | POA: Diagnosis not present

## 2023-05-21 DIAGNOSIS — G43909 Migraine, unspecified, not intractable, without status migrainosus: Secondary | ICD-10-CM | POA: Diagnosis not present

## 2023-05-21 MED ORDER — ONDANSETRON HCL 4 MG/2ML IJ SOLN
4.0000 mg | Freq: Once | INTRAMUSCULAR | Status: AC
  Administered 2023-05-21: 4 mg via INTRAVENOUS
  Filled 2023-05-21: qty 2

## 2023-05-21 MED ORDER — KETOROLAC TROMETHAMINE 15 MG/ML IJ SOLN
15.0000 mg | Freq: Once | INTRAMUSCULAR | Status: AC
  Administered 2023-05-21: 15 mg via INTRAVENOUS
  Filled 2023-05-21: qty 1

## 2023-05-21 MED ORDER — SODIUM CHLORIDE 0.9 % IV BOLUS
1000.0000 mL | Freq: Once | INTRAVENOUS | Status: AC
  Administered 2023-05-21: 1000 mL via INTRAVENOUS

## 2023-05-21 MED ORDER — DEXAMETHASONE SODIUM PHOSPHATE 10 MG/ML IJ SOLN
10.0000 mg | Freq: Once | INTRAMUSCULAR | Status: AC
  Administered 2023-05-21: 10 mg via INTRAVENOUS
  Filled 2023-05-21: qty 1

## 2023-05-21 NOTE — ED Triage Notes (Signed)
T to ed from home via POV for migraine since 2 hours ago. Pt has HX of chronic migraines. Has had several this week. Pt has been seen her recently for same. Pt states "you guys treat with zofran, toradol and decadron which help a great deal". Pt is CAOx4, in no acute distress and ambulatory in triage.

## 2023-05-21 NOTE — ED Provider Notes (Addendum)
[place] [facility] Provider Note   CSN: [id number] Arrival date & time: 05/21/23  1539     History  Chief Complaint  Patient presents with   Headache    migraine    Jennifer [name] is a 67 y.o. female presents to the emergency department for evaluation of a migraine headache.  She has a history of migraine headaches, sees neurology and takes Maxalt as needed.  She has had several headaches this week that have not been responding well to the Maxalt.  She has been seen several times recently in the ER, has had the same cocktail of dexamethasone, Zofran, Toradol and 1 L of IV fluids with excellent relief.  Patient states this is her normal headache no new symptoms.  She denies any fevers, chills body aches.  No other systemic symptoms.  No weakness or vision changes.  HPI     Home Medications Prior to Admission medications   Medication Sig Start Date End Date Taking? Authorizing Provider  ALPRAZolam (XANAX) 0.5 MG tablet TAKE 1/2 TO 1 TABLET BY MOUTH ONCE DAILY AS NEEDED FOR SLEEP/ANXIETY 10/22/22   Tower, Marne A, MD  atenolol (TENORMIN) 25 MG tablet Take 12.5 mg by mouth 3 (three) times daily. For migraine prevention    [provider]  B Complex Vitamins (B COMPLEX PO) Take by mouth.    [provider]  baclofen (LIORESAL) 10 MG tablet Take by mouth as needed. 11/18/21   [provider]  CALCIUM CARBONATE-VITAMIN D PO Take 1 tablet by mouth daily. 1000 mg    [provider]  famotidine (PEPCID) 40 MG tablet Take 40 mg by mouth 2 (two) times daily.     [provider]  hydrocortisone 2.5 % cream Apply to affected areas BID PRN as directed. 10/23/20   Moye, [place], MD  hydrOXYzine (ATARAX) 25 MG tablet Take 37.5 mg by mouth at bedtime as needed.    [provider]  letrozole (FEMARA) 2.5 MG tablet TAKE 1 TABLET BY MOUTH EVERY DAY 09/02/22   Serena Croissant, MD  levocetirizine (XYZAL) 5 MG tablet Take  5 mg by mouth at bedtime.  10/28/17   [provider]  levothyroxine (SYNTHROID) 75 MCG tablet TAKE 1 TABLET BY MOUTH DAILY BEFORE BREAKFAST 03/01/23   Tower, Audrie Gallus, MD  Magnesium Oxide 400 (240 Mg) MG TABS Take 400 mg by mouth daily.     [provider]  metFORMIN (GLUCOPHAGE-XR) 500 MG 24 hr tablet TAKE 1 TABLET BY MOUTH EVERY DAY WITH BREAKFAST 12/08/22   Tower, Audrie Gallus, MD  methocarbamol (ROBAXIN) 500 MG tablet Take 500 mg by mouth 2 (two) times daily. 01/25/23   [provider]  Multiple Vitamin (MULTIVITAMIN WITH MINERALS) TABS tablet Take 1 tablet by mouth daily.    [provider]  nabumetone (RELAFEN) 500 MG tablet Take 500 mg by mouth 2 (two) times daily. 09/03/22   [provider]  neomycin-polymyxin b-dexamethasone (MAXITROL) 3.5-10000-0.1 SUSP 1 drop 3 (three) times daily. 08/27/22   [provider]  Omega-3 Fatty Acids (FISH OIL) 1000 MG CAPS Take 1,000 mg by mouth daily.     [provider]  OnabotulinumtoxinA (BOTOX IJ) Inject 1 Dose as directed every 6 (six) weeks.     [provider]  ondansetron (ZOFRAN ODT) 4 MG disintegrating tablet Take 1 tablet (4 mg total) by mouth every 8 (eight) hours as needed. 10/08/20   Nita Sickle, MD  pantoprazole (PROTONIX) 20 MG  tablet Take 1 tablet (20 mg total) by mouth daily. 01/12/23   Zehr, Princella Pellegrini, PA-C  predniSONE (DELTASONE) 20 MG tablet Take 4 pills once daily by mouth for 3 days, then 3 pills daily for 3 days, then 2 pills daily for 3 days then 1 pill daily for 3 days then stop 03/05/23   Tower, Audrie Gallus, MD  PRESCRIPTION MEDICATION Inject into the skin every 6 (six) weeks. Steroid Trigger    [provider]  Probiotic Product (PROBIOTIC-10 PO) Take 1 capsule by mouth daily.     [provider]  promethazine (PHENERGAN) 25 MG tablet Take 25 mg by mouth daily as needed. 10/12/22   [provider]  rizatriptan (MAXALT) 10 MG tablet Take 1 tablet  (10 mg total) by mouth once as needed for up to 10 days for migraine. May repeat in 2 hours if needed 10/15/21 02/02/23  Tower, Audrie Gallus, MD  traMADol (ULTRAM) 50 MG tablet Take 0.5 tablets (25 mg total) by mouth every 8 (eight) hours as needed (cough). 12/23/22   Tower, Audrie Gallus, MD  triamcinolone (KENALOG) 0.1 % Apply to affected areas BID PRN for up to two weeks at a time as directed. 10/23/20   Moye, [place], MD  TURMERIC PO Take 1 tablet by mouth daily.    [provider]      Allergies    Ciprofloxacin, Codeine phosphate, Decongestant [pseudoephedrine], Diclofenac, Flonase [fluticasone propionate], Hydrocodone, Macrobid [nitrofurantoin], Nasonex [mometasone], Reglan [metoclopramide], Topamax [topiramate], Benadryl [diphenhydramine hcl], and Sulfa antibiotics    Review of Systems   Review of Systems  Physical Exam Updated Vital Signs BP 120/69   Pulse 62   Temp 98.3 F (36.8 C) (Oral)   Resp 16   Ht 4\' 11"  (1.499 m)   Wt 59 kg   SpO2 98%   BMI 26.26 kg/m  Physical Exam Constitutional:      Appearance: She is well-developed.  HENT:     Head: Normocephalic and atraumatic.  Eyes:     Extraocular Movements: Extraocular movements intact.     Conjunctiva/sclera: Conjunctivae normal.     Pupils: Pupils are equal, round, and reactive to light.  Cardiovascular:     Rate and Rhythm: Normal rate.  Pulmonary:     Effort: Pulmonary effort is normal. No respiratory distress.  Musculoskeletal:        General: Normal range of motion.     Cervical back: Normal range of motion.  Skin:    General: Skin is warm.     Findings: No rash.  Neurological:     Mental Status: She is alert and oriented to person, place, and time.     Cranial Nerves: No cranial nerve deficit or facial asymmetry.     Motor: No weakness.     Coordination: Coordination normal.     Gait: Gait normal.  Psychiatric:        Mood and Affect: Mood normal.        Speech: Speech normal.        Behavior: Behavior  normal.        Thought Content: Thought content normal.     ED Results / Procedures / Treatments   Labs (all labs ordered are listed, but only abnormal results are displayed) Labs Reviewed - No data to display  EKG None  Radiology No results found.  Procedures Procedures   Medications Ordered in ED Medications  sodium chloride 0.9 % bolus 1,000 mL (has no administration in time range)  ondansetron (ZOFRAN) injection 4 mg (has no administration in time range)  ketorolac (TORADOL) 15 MG/ML injection 15 mg (has no administration in time range)  dexamethasone (DECADRON) injection 10 mg (has no administration in time range)    ED Course/ Medical Decision Making/ A&P                             Medical Decision Making Risk Prescription drug management.   67 year old female with chronic migraine headaches.  She has had several flareups recently and responded well to Decadron, Zofran and Toradol.  Recent blood work shows kidney function within normal limits.  Today, patient received IV fluids, Decadron, Zofran and Toradol which helped with her migraine headache.  Symptoms resolved completely.  Patient tolerated medications well and normal headache symptoms since resolved.  She has no red flag symptoms on history or exam concerning for advanced imaging.  Patient will follow-up with neurologist as needed she understands signs symptoms return to the ER for. Final Clinical Impression(s) / ED Diagnoses Final diagnoses:  Migraine without aura and without status migrainosus, not intractable    Rx / DC Orders ED Discharge Orders     None         Evon Slack, PA-C 05/21/23 1755    Evon Slack, PA-C 05/21/23 1755    Pilar Jarvis, MD 05/21/23 [phone]

## 2023-05-28 ENCOUNTER — Emergency Department
Admission: EM | Admit: 2023-05-28 | Discharge: 2023-05-28 | Disposition: A | Discharge status: Home / Self Care | Payer: Medicare HMO | Attending: Emergency Medicine | Admitting: Emergency Medicine

## 2023-05-28 ENCOUNTER — Other Ambulatory Visit: Payer: Self-pay

## 2023-05-28 DIAGNOSIS — G43809 Other migraine, not intractable, without status migrainosus: Secondary | ICD-10-CM | POA: Diagnosis not present

## 2023-05-28 DIAGNOSIS — G43909 Migraine, unspecified, not intractable, without status migrainosus: Secondary | ICD-10-CM | POA: Diagnosis present

## 2023-05-28 MED ORDER — SODIUM CHLORIDE 0.9 % IV BOLUS
1000.0000 mL | Freq: Once | INTRAVENOUS | Status: AC
  Administered 2023-05-28: 1000 mL via INTRAVENOUS

## 2023-05-28 MED ORDER — RIZATRIPTAN BENZOATE 10 MG PO TABS: 10.0000 mg | ORAL_TABLET | Freq: Once | ORAL | 0 refills | Status: DC | PRN

## 2023-05-28 MED ORDER — DEXAMETHASONE SODIUM PHOSPHATE 10 MG/ML IJ SOLN
8.0000 mg | Freq: Once | INTRAMUSCULAR | Status: AC
  Administered 2023-05-28: 8 mg via INTRAVENOUS
  Filled 2023-05-28: qty 1

## 2023-05-28 MED ORDER — KETOROLAC TROMETHAMINE 30 MG/ML IJ SOLN
30.0000 mg | Freq: Once | INTRAMUSCULAR | Status: AC
  Administered 2023-05-28: 30 mg via INTRAVENOUS
  Filled 2023-05-28: qty 1

## 2023-05-28 MED ORDER — ONDANSETRON HCL 4 MG/2ML IJ SOLN
4.0000 mg | Freq: Once | INTRAMUSCULAR | Status: AC
  Administered 2023-05-28: 4 mg via INTRAVENOUS
  Filled 2023-05-28: qty 2

## 2023-05-28 NOTE — ED Triage Notes (Signed)
Pt to ED for migraine x3 days. +nausea. Hx of same. Reports out of triptans, has not taken any meds today. Was seen 4/27 and 6/28 for same.

## 2023-05-28 NOTE — ED Notes (Signed)
See triage note  Presents with migraine   States this pain started on Wednesday  no relief with her meds

## 2023-05-28 NOTE — ED Provider Notes (Signed)
   [facility] Provider Note    Event Date/Time   First MD Initiated Contact with Patient 05/28/23 1405     (approximate)   History   Migraine   HPI  Jennifer [name] is a 67 y.o. female with a history of migraine headaches who reports that she has been out of her rizatriptan for several days.  She states that when she gets into a migraine like this she typically needs migraine cocktail.  Denies fevers or chills.  No neck pain     Physical Exam   Triage Vital Signs: ED Triage Vitals [05/28/23 1302]  Enc Vitals Group     BP 126/79     Pulse Rate 64     Resp 18     Temp 98.4 F (36.9 C)     Temp src      SpO2 98 %     Weight 58 kg (127 lb 13.9 oz)     Height 1.499 m (4\' 11" )     Head Circumference      Peak Flow      Pain Score 3     Pain Loc      Pain Edu?      Excl. in GC?     Most recent vital signs: Vitals:   05/28/23 1302  BP: 126/79  Pulse: 64  Resp: 18  Temp: 98.4 F (36.9 C)  SpO2: 98%     General: Awake, no distress.  CV:  Good peripheral perfusion.  Resp:  Normal effort.  Abd:  No distention.  Other:     ED Results / Procedures / Treatments   Labs (all labs ordered are listed, but only abnormal results are displayed) Labs Reviewed - No data to display   EKG     RADIOLOGY     PROCEDURES:  Critical Care performed:   Procedures   MEDICATIONS ORDERED IN ED: Medications  dexamethasone (DECADRON) injection 8 mg (8 mg Intravenous Given 05/28/23 1514)  ondansetron (ZOFRAN) injection 4 mg (4 mg Intravenous Given 05/28/23 1514)  ketorolac (TORADOL) 30 MG/ML injection 30 mg (30 mg Intravenous Given 05/28/23 1513)  sodium chloride 0.9 % bolus 1,000 mL (1,000 mLs Intravenous New Bag/Given 05/28/23 1513)     IMPRESSION / MDM / ASSESSMENT AND PLAN / ED COURSE  I reviewed the triage vital signs and the nursing notes. Patient's presentation is most consistent with exacerbation of chronic illness.  Patient here with  symptoms consistent with migraine headache.  This is common for her.  She is out of her medications.  Will treat with IV Decadron, IV Zofran, IV Toradol which she reports has worked frequently for her in the past  Will refill her triptans  ----------------------------------------- 3:56 PM on 05/28/2023 ----------------------------------------- Patient feeling much better after treatment, appropriate for discharge at this time        FINAL CLINICAL IMPRESSION(S) / ED DIAGNOSES   Final diagnoses:  Other migraine without status migrainosus, not intractable     Rx / DC Orders   ED Discharge Orders          Ordered    rizatriptan (MAXALT) 10 MG tablet  Once PRN        05/28/23 1526             Note:  This document was prepared using Dragon voice recognition software and may include unintentional dictation errors.   Jene Every, MD 05/28/23 1556

## 2023-06-03 ENCOUNTER — Telehealth: Payer: Self-pay

## 2023-06-03 NOTE — Telephone Encounter (Signed)
Transition Care Management Follow-up Telephone Call Date of discharge and from where: [place] 7/5 How have you been since you were released from the hospital? Doing good  Any questions or concerns? No  Items Reviewed: Did the pt receive and understand the discharge instructions provided? Yes  Medications obtained and verified? Yes  Other? No  Any new allergies since your discharge? No  Dietary orders reviewed? No Do you have support at home? Yes     Follow up appointments reviewed:  PCP Hospital f/u appt confirmed? Yes  Scheduled to see  on  @ . Specialist Hospital f/u appt confirmed? Yes  Scheduled to see  on  @ . Are transportation arrangements needed? No  If their condition worsens, is the pt aware to call PCP or go to the Emergency Dept.? Yes Was the patient provided with contact information for the PCP's office or ED? Yes Was to pt encouraged to call back with questions or concerns? Yes

## 2023-06-08 ENCOUNTER — Other Ambulatory Visit: Payer: Self-pay | Admitting: Family Medicine

## 2023-06-09 NOTE — Telephone Encounter (Signed)
Last A1c was 08/25/22, please advise

## 2023-06-17 DIAGNOSIS — M791 Myalgia, unspecified site: Secondary | ICD-10-CM | POA: Diagnosis not present

## 2023-06-17 DIAGNOSIS — M542 Cervicalgia: Secondary | ICD-10-CM | POA: Diagnosis not present

## 2023-06-17 DIAGNOSIS — G43719 Chronic migraine without aura, intractable, without status migrainosus: Secondary | ICD-10-CM | POA: Diagnosis not present

## 2023-06-17 DIAGNOSIS — G518 Other disorders of facial nerve: Secondary | ICD-10-CM | POA: Diagnosis not present

## 2023-06-24 ENCOUNTER — Other Ambulatory Visit: Payer: Self-pay

## 2023-06-24 ENCOUNTER — Emergency Department
Admission: EM | Admit: 2023-06-24 | Discharge: 2023-06-24 | Disposition: A | Discharge status: Home / Self Care | Payer: Medicare HMO | Attending: Emergency Medicine | Admitting: Emergency Medicine

## 2023-06-24 ENCOUNTER — Encounter: Payer: Self-pay | Admitting: Hematology and Oncology

## 2023-06-24 DIAGNOSIS — E119 Type 2 diabetes mellitus without complications: Secondary | ICD-10-CM | POA: Insufficient documentation

## 2023-06-24 DIAGNOSIS — E039 Hypothyroidism, unspecified: Secondary | ICD-10-CM | POA: Diagnosis not present

## 2023-06-24 DIAGNOSIS — G43001 Migraine without aura, not intractable, with status migrainosus: Secondary | ICD-10-CM | POA: Insufficient documentation

## 2023-06-24 DIAGNOSIS — I10 Essential (primary) hypertension: Secondary | ICD-10-CM | POA: Insufficient documentation

## 2023-06-24 DIAGNOSIS — G43009 Migraine without aura, not intractable, without status migrainosus: Secondary | ICD-10-CM | POA: Diagnosis not present

## 2023-06-24 MED ORDER — KETOROLAC TROMETHAMINE 30 MG/ML IJ SOLN
15.0000 mg | Freq: Once | INTRAMUSCULAR | Status: AC
  Administered 2023-06-24: 15 mg via INTRAVENOUS
  Filled 2023-06-24: qty 1

## 2023-06-24 MED ORDER — ONDANSETRON HCL 4 MG/2ML IJ SOLN
4.0000 mg | Freq: Once | INTRAMUSCULAR | Status: AC
  Administered 2023-06-24: 4 mg via INTRAVENOUS
  Filled 2023-06-24: qty 2

## 2023-06-24 MED ORDER — DEXAMETHASONE SODIUM PHOSPHATE 10 MG/ML IJ SOLN
10.0000 mg | Freq: Once | INTRAMUSCULAR | Status: AC
  Administered 2023-06-24: 10 mg via INTRAVENOUS
  Filled 2023-06-24: qty 1

## 2023-06-24 MED ORDER — SODIUM CHLORIDE 0.9 % IV BOLUS
1000.0000 mL | Freq: Once | INTRAVENOUS | Status: AC
  Administered 2023-06-24: 1000 mL via INTRAVENOUS

## 2023-06-24 NOTE — ED Triage Notes (Signed)
Pt to ED via POV from home. Pt ambulatory to triage without difficulty. Pt reports migraine started on Tuesday and symptoms have not relieved. Pt reports taking at home medication with no relief. Pt seen recently for same.

## 2023-06-24 NOTE — ED Provider Notes (Signed)
[facility] Provider Note    Event Date/Time   First MD Initiated Contact with Patient 06/24/23 0831     (approximate)   History   Migraine   HPI  Jennifer [name] is a 67 y.o. female with history of chronic migraines, hypothyroid, diabetes and hypertension presents emergency department with complaints of migraine headache.  Patient states she thinks got dehydrated yesterday she was at a funeral.  States she ate foods that she should not have which triggered her migraine.  Has taken 3 of her medications at home which are not helping.  Feels that she needs her regular cocktail which includes fluids, Toradol, Decadron, and Zofran.  Patient does get Botox injections for her migraines.  No change in the frequency.  States it is very typical of her regular migraine.      Physical Exam   Triage Vital Signs: ED Triage Vitals  Encounter Vitals Group     BP 06/24/23 0820 137/80     Systolic BP Percentile --      Diastolic BP Percentile --      Pulse Rate 06/24/23 0820 64     Resp 06/24/23 0820 18     Temp 06/24/23 0820 97.6 F (36.4 C)     Temp Source 06/24/23 0820 Oral     SpO2 06/24/23 0820 99 %     Weight 06/24/23 0823 127 lb 13.9 oz (58 kg)     Height 06/24/23 0823 4\' 11"  (1.499 m)     Head Circumference --      Peak Flow --      Pain Score 06/24/23 0820 3     Pain Loc --      Pain Education --      Exclude from Growth Chart --     Most recent vital signs: Vitals:   06/24/23 0820  BP: 137/80  Pulse: 64  Resp: 18  Temp: 97.6 F (36.4 C)  SpO2: 99%     General: Awake, no distress.   CV:  Good peripheral perfusion. regular rate and  rhythm Resp:  Normal effort. Lungs cta Abd:  No distention.   Other:  Cranial nerves II through XII grossly intact, PERRL, EOMI, 2 beats nystagmus on the horizontal line   ED Results / Procedures / Treatments   Labs (all labs ordered are listed, but only abnormal results are displayed) Labs Reviewed - No  data to display   EKG     RADIOLOGY     PROCEDURES:   Procedures   MEDICATIONS ORDERED IN ED: Medications  sodium chloride 0.9 % bolus 1,000 mL (1,000 mLs Intravenous New Bag/Given 06/24/23 0902)  ondansetron (ZOFRAN) injection 4 mg (4 mg Intravenous Given 06/24/23 0903)  ketorolac (TORADOL) 30 MG/ML injection 15 mg (15 mg Intravenous Given 06/24/23 0903)  dexamethasone (DECADRON) injection 10 mg (10 mg Intravenous Given 06/24/23 0903)     IMPRESSION / MDM / ASSESSMENT AND PLAN / ED COURSE  I reviewed the triage vital signs and the nursing notes.                              Differential diagnosis includes, but is not limited to, chronic migraine, COVID, tumor, subdural, SAH  Patient's presentation is most consistent with exacerbation of chronic illness.   Patient's migraine has not changed in the nature of her frequency so do not see any red flags to warrant imaging at this time.  She states this is very typical of her migraines.  Will do fluids, Decadron, Toradol, and Zofran.   ----------------------------------------- 10:04 AM on 06/24/2023 ----------------------------------------- Patient is feeling much better after medications.  Will discharge to home.  Follow-up with her neurologist.  She is in agreement treatment plan.  Discharged stable condition.   FINAL CLINICAL IMPRESSION(S) / ED DIAGNOSES   Final diagnoses:  Migraine without aura and with status migrainosus, not intractable     Rx / DC Orders   ED Discharge Orders     None        Note:  This document was prepared using Dragon voice recognition software and may include unintentional dictation errors.    Faythe Ghee, PA-C 06/24/23 1004    Merwyn Katos, MD 06/25/23 [phone]

## 2023-06-27 ENCOUNTER — Other Ambulatory Visit: Payer: Self-pay | Admitting: Family Medicine

## 2023-06-28 NOTE — Telephone Encounter (Signed)
Looks like GI provider may have changed the does at last OV, will route to their office for review

## 2023-06-30 ENCOUNTER — Ambulatory Visit: Payer: Medicare HMO | Admitting: Neurology

## 2023-06-30 ENCOUNTER — Telehealth: Payer: Self-pay

## 2023-06-30 ENCOUNTER — Encounter: Payer: Self-pay | Admitting: Neurology

## 2023-06-30 VITALS — BP 124/71 | HR 58 | Ht 60.0 in | Wt 127.0 lb

## 2023-06-30 DIAGNOSIS — G43711 Chronic migraine without aura, intractable, with status migrainosus: Secondary | ICD-10-CM

## 2023-06-30 DIAGNOSIS — G43009 Migraine without aura, not intractable, without status migrainosus: Secondary | ICD-10-CM

## 2023-06-30 MED ORDER — NURTEC 75 MG PO TBDP: ORAL_TABLET | ORAL | 0 refills | Status: DC

## 2023-06-30 NOTE — Progress Notes (Addendum)
[facility]    Provider:  Dr Lucia Gaskins Requesting Provider: Milinda Antis Audrie Gallus, MD Primary Care Provider:  Judy Pimple, MD  CC:  migraines  Adendum 07/29/2023: Better on Emgality now can prescribe nurtec. 6 migraine days a month and < 10 total headache days a month. Prescribe nurtec. Had side effects to Sumatriptan and Rizatriptan.  HPI:  Jennifer [name] is a 67 y.o. female here as requested by Judy Pimple, MD for second opinion on migraine. has Hypothyroidism; Migraine with aura; GERD; Osteopenia; Routine general medical examination at a health care facility; Dysuria; Screening for lipoid disorders; Preventative health care; Allergic rhinitis; Prediabetes; Colon cancer screening; Joint pain; B12 deficiency; Malignant neoplasm of upper-inner quadrant of right breast in female, estrogen receptor positive (HCC); Family history of breast cancer; Adnexal cyst; Family history of prostate cancer; Bilateral hand pain; Genetic testing; History of breast cancer; Dizziness; Common bile duct dilatation; Opacity of lung on imaging study; Iron deficiency anemia; De Quervain's tenosynovitis, right; Microcytic anemia; Right leg pain; Left ear pain; Left facial swelling; Raynaud's disease; Hordeolum externum (stye); Pain in right shoulder; Right hand pain; Right ankle pain; and Thoracic back pain on their problem list.   I reviewed Dr. Royden Purl notes from April 12 from when patient was referred, she is here for second opinion on migraines, she has been following with Ortho for neck pain thought to have myofascial pain syndrome with trigger points in right scapular trap region and rhomboid, she was also sent for physical therapy for manual treatments and dry needling.  This was for cervical and thoracic back pain.  Shoulder film and CT of the head were okay.  They discussed baclofen and rhomboid stretching/TENS unit.  Dry needling helped on April 10 lasted a few hours then soreness improved and really  helped.  She is doing stretches.  Prednisone tapers have helped in the past for headaches and she has been to the emergency room for headaches.  They gave her Toradol and baclofen the day before.  Has used TENS unit on and off.  She sees Dr. Neale Burly at the [facility].  She has been diagnosed with migraine with aura and not making any progress with trigger point injections, she has been in the ER frequently for treatments headache cocktails, has not had any CGRP drugs unclear about Botox she is running out of triptans every month and she gets rebound with analgesics recently worsened by neck and back problems.  I reviewed the rest of her chart which included multiple physical therapy treatments.  She was in the emergency room March 20, 2019 for for headaches again.  She was in the emergency room again on 05/21/2023 with a headache.  She has followed with Ortho care as well.  She was in the emergency room July 5 and October 1 as well.  She had a CT of the head July 01, 2022 which did not show anything acute and it was due to dizziness and headaches but did show paranasal sinus mucosal thickening with fluid levels representing acute sinusitis in the appropriate clinical setting.  She has also had multiple MRIs the last 1 was in 07/30/2020 which I reviewed which showed a normal MRI appearance of the brain which has been stable since 2011.  [place]" Care Everywhere" I also see multiple CTs of the head CT of the cervical spine most recently in March 2024 for increasing headache.  I do not have a headache wellness records but she has seen multiple  doctors for her headaches we will request.   Patient reports she has migraines since 1994. She has pulsating/pounding/throbbing, nausea, multiple vomiting, hurts to move, she has been going to the headache wellness center since 2004, She has at least >8 migraine days a month and >15 headache days a month, she gets massages, she gets acupuncture and dry needling and  myofascial release tools, she has tried a plethora of medications and other modalities. She doesn't eat dairy, processed foods, they can be triggers, she has neck pain, no aura, no medication overuse, stress, lack of sleep, smells, photophobia, phonophobia, lasted up to 72 hours moderate to severe, weather and barometric pressure, maxalt helps but she runs out. She has no associated symptoms, she gets significant fatigue as a prodrome and has a hangover the next day, no inciting event, mother had migraines, never have been hormonal, usually unilateral more on the right behind the eye, associatedmyofascial neck pain, No other focal neurologic deficits, associated symptoms, inciting events or modifiable factors.  From a thorough review of records, medications tried greater than 3 months that can be used in headache or migraine management include: Tylenol, ibuprofen, Xanax, aspirin, atenolol, baclofen, Fioricet, Decadron, gabapentin, hydrocodone, hydroxyzine, ketorolac injections and tablets, Claritin, mag oxide p.o., mag sulfate IV, methylprednisolone, Botox since 2019, Zofran, prednisone tablets on multiple occasions, Compazine injections, promethazine injections and oral, sumatriptan, rizatriptan, tramadol, amitriptyline, nortriptyline, topiramate, zonisamide, Robaxin, Aimovig contraindicated due to constipation, cephaly,    Reviewed notes, labs and imaging from outside physicians, which showed:  CT head march 2024([place]): CT head without contrast.  CT cervical spine without contrast.   COMPARISON: None.   INDICATION: Increasing headaches. Recent posterior head trauma.   TECHNIQUE: Dose lowering technique(s) such as automated exposure control, iterative reconstruction, and mA and/or kV adjustment for patient size was utilized for this examination.   FINDINGS:   CT HEAD:   Normal overall brain volume for age.   No midline shift or hydrocephalus.   No hemorrhages or skull fractures or acute  infarctions.   Visualized paranasal sinuses and mastoid air cells clear.   MRI brain 07/30/2020: CLINICAL DATA:  67 year old female with headache, dizziness. Recent intermittent episodes of right foot heaviness, dizziness and nausea. Chronic migraines, 20 years.   EXAM: MRI HEAD WITHOUT AND WITH CONTRAST   TECHNIQUE: Multiplanar, multiecho pulse sequences of the brain and surrounding structures were obtained without and with intravenous contrast.   CONTRAST:  6mL GADAVIST GADOBUTROL 1 MMOL/ML IV SOLN   COMPARISON:  Brain MRI 05/01/2010. Head CT 09/24/2018.   FINDINGS: Brain: Cerebral volume is normal for age and has not significantly changed since 2011. No restricted diffusion to suggest acute infarction. No midline shift, mass effect, evidence of mass lesion, ventriculomegaly, extra-axial collection or acute intracranial hemorrhage. Cervicomedullary junction and pituitary are within normal limits.   Wallace Cullens and white matter signal is within normal limits throughout the brain. No encephalomalacia or chronic cerebral blood products.   No abnormal enhancement identified. No dural thickening.   Vascular: Major intracranial vascular flow voids are stable since 2011. The major dural venous sinuses are enhancing and appear to be patent.   Skull and upper cervical spine: Normal visible cervical spine. Bone marrow signal is stable since 2011 and within normal limits.   Sinuses/Orbits: Negative orbits. A chronic left maxillary mucous retention cyst has regressed since 2011. Other paranasal sinuses remain clear.   Other: Mastoids are clear. Grossly normal visible internal auditory structures. Normal stylomastoid foramina. Scalp and face appear negative.  IMPRESSION: Normal MRI appearance of the brain, stable since 2011. No explanation for symptoms.      Latest Ref Rng & Units 10/12/2022   12:39 AM 08/07/2022   10:04 AM 07/06/2022   12:52 PM  CBC  WBC 4.0 - 10.5 K/uL 7.5  8.7   7.5   Hemoglobin 12.0 - 15.0 g/dL 16.1  09.6  04.5   Hematocrit 36.0 - 46.0 % 37.3  39.6  38.6   Platelets 150 - 400 K/uL 320  314  301        Latest Ref Rng & Units 10/12/2022   12:39 AM 08/07/2022   10:04 AM 07/06/2022   12:52 PM  CMP  Glucose 70 - 99 mg/dL 409  811  72   BUN 8 - 23 mg/dL 17  22  15    Creatinine 0.44 - 1.00 mg/dL 9.14  7.82  9.56   Sodium 135 - 145 mmol/L 132  138  134   Potassium 3.5 - 5.1 mmol/L 3.3  3.7  3.9   Chloride 98 - 111 mmol/L 94  103  100   CO2 22 - 32 mmol/L 26  27  31    Calcium 8.9 - 10.3 mg/dL 9.1  9.6  9.4   Total Protein 6.5 - 8.1 g/dL   6.9   Total Bilirubin 0.3 - 1.2 mg/dL   0.4   Alkaline Phos 38 - 126 U/L   86   AST 15 - 41 U/L   13   ALT 0 - 44 U/L   16     TSH 08/25/2022 2.53 normal  CT head 07/01/2022: CLINICAL DATA:  Dizziness, persistent/recurrent, cardiac or vascular cause suspected   Dizziness and headache this afternoon.   EXAM: CT HEAD WITHOUT CONTRAST   TECHNIQUE: Contiguous axial images were obtained from the base of the skull through the vertex without intravenous contrast.   RADIATION DOSE REDUCTION: This exam was performed according to the departmental dose-optimization program which includes automated exposure control, adjustment of the mA and/or kV according to patient size and/or use of iterative reconstruction technique.   COMPARISON:  Head CT 06/07/2022   FINDINGS: Brain: No intracranial hemorrhage, mass effect, or midline shift. No hydrocephalus. The basilar cisterns are patent. No evidence of territorial infarct or acute ischemia. No extra-axial or intracranial fluid collection.   Vascular: No hyperdense vessel or unexpected calcification.   Skull: No fracture or focal lesion.   Sinuses/Orbits: Mucosal thickening with small fluid level in left side of sphenoid sinus. Small left maxillary sinus fluid level. Mucosal thickening throughout ethmoid air cells. No mastoid effusion.   Other: None.    IMPRESSION: 1. No acute intracranial abnormality. 2. Paranasal sinus mucosal thickening with fluid levels, may represent acute sinusitis in the appropriate clinical setting.  Review of Systems: Patient complains of symptoms per HPI as well as the following symptoms fatigue, back pain, neck pain, headaches, anxiety. Pertinent negatives and positives per HPI. All others negative.   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   Social Determinants of Health   Financial Resource Strain: Low Risk  (02/12/2023)   Overall Financial Resource Strain (CARDIA)    Difficulty of Paying Living Expenses: Not hard at all  Food Insecurity: No Food Insecurity (02/12/2023)   Hunger Vital Sign    Worried About Running Out of Food in the Last Year: Never true    Ran Out of Food in the Last Year: Never true  Transportation Needs: No Transportation Needs (02/12/2023)   PRAPARE - Administrator, Civil Service (Medical): No    Lack of Transportation (Non-Medical): No  Physical Activity: Insufficiently Active (02/12/2023)   Exercise Vital Sign    Days of Exercise per Week: 2 days    Minutes of Exercise per Session: 20 min  Stress: No Stress Concern  Present (02/12/2023)   [employer] of Occupational Health - Occupational Stress Questionnaire    Feeling of Stress : Only a little  Social Connections: Unknown (04/09/2023)   Received from [facility], [facility] Short Social Needs Screening - Social Connection    Would you like help with any of the following needs: food, medicine/medical supplies, transportation, loneliness, housing or utilities?: Not on file  Intimate Partner Violence: Not At Risk (01/25/2023)   Received from [facility], [facility]   [facility] Domestic Violence    Do you feel threatened or afraid of others close to you?: No    Family History  Problem Relation Age of Onset   Breast cancer Mother 6   Diabetes Mother    Irritable bowel syndrome Mother    Hypertension Father    Heart disease Father    Hypertension Sister    Anuerysm Sister        head   Irritable bowel syndrome Sister    Hypertension Sister    Anxiety disorder Sister    Other Sister        prediabetes   Celiac disease Brother    Osteoporosis Maternal Grandmother    Diabetes Maternal Grandmother    COPD Paternal Grandmother    Heart disease Paternal Grandfather    Breast cancer Cousin 1       pat first cousin   Breast cancer Cousin        mat second cousin with breast cancer in her 30s   Thyroid cancer Cousin 68       pat first cousin   Prostate cancer Other 31   Colon cancer Neg Hx    Stomach cancer Neg Hx    Esophageal cancer Neg Hx    Pancreatic cancer Neg Hx    Rectal cancer Neg Hx    Colon polyps Neg Hx    Crohn's disease Neg Hx    Ulcerative colitis Neg Hx     Past Medical History:  Diagnosis Date   Allergic rhinitis    Allergy    seasonal   Anemia    Anxiety    Arthritis    hands, knees   Asthma    "as a child"   Blood transfusion without reported diagnosis    "as a child"  Breast cancer (HCC) 2019   Right Breast Cancer   Cancer (HCC) 09/2018   Breast CA new DX, right breast   Cervical  dysplasia    Common bile duct dilation    Diabetes mellitus without complication (HCC)    Endometriosis    Esophageal reflux    Family history of adverse reaction to anesthesia    younger sister anxiety for a dew days after   Family history of breast cancer    Family history of breast cancer    Family history of prostate cancer    Hepatic cyst 08/2020   multiple   Hepatic hemangioma 08/2020   intrahepatic hemangioma   Hiatal hernia    HSV-1 (herpes simplex virus 1) infection    Hypertension    pt.denies 10/19/22   Hypothyroid    Migraine with aura, without mention of intractable migraine without mention of status migrainosus    Osteopenia 08/2019   T score -2.2 FRAX 11% / 1.7%   Personal history of radiation therapy 2019   Right Breast Cancer   Pre-diabetes    Raynaud's disease     Patient Active Problem List   Diagnosis Date Noted   Right hand pain 01/21/2023   Right ankle pain 01/21/2023   Thoracic back pain 01/21/2023   Pain in right shoulder 08/12/2022   Hordeolum externum (stye) 08/04/2022   Raynaud's disease 03/17/2022   Left facial swelling 01/25/2022   Left ear pain 01/23/2022   Right leg pain 10/15/2021   Microcytic anemia 07/07/2021   De Quervain's tenosynovitis, right 04/18/2021   Iron deficiency anemia 09/08/2020   Common bile duct dilatation 09/06/2020   Opacity of lung on imaging study 09/06/2020   Dizziness 07/10/2020   History of breast cancer 03/01/2020   Genetic testing 11/14/2019   Bilateral hand pain 11/03/2019   Family history of prostate cancer    Adnexal cyst 09/26/2019   Family history of breast cancer    Malignant neoplasm of upper-inner quadrant of right breast in female, estrogen receptor positive (HCC) 09/20/2018   B12 deficiency 04/13/2018   Joint pain 04/11/2018   Colon cancer screening 09/16/2017   Prediabetes 07/28/2016   Allergic rhinitis 11/13/2014   Screening for lipoid disorders 09/26/2014   Preventative health care  09/26/2014   Dysuria 08/07/2014   Routine general medical examination at a health care facility 10/28/2012   GERD 09/16/2010   Hypothyroidism 05/07/2008   Osteopenia 05/07/2008   Migraine with aura 11/11/2007    Past Surgical History:  Procedure Laterality Date   BIOPSY  09/12/2020   Procedure: BIOPSY;  Surgeon: Rachael Fee, MD;  Location: [facility];  Service: Endoscopy;;   BREAST BIOPSY Left 11/27/2022   MM LT BREAST BX W LOC DEV 1ST LESION IMAGE BX SPEC STEREO GUIDE 11/27/2022 [facility]   BREAST EXCISIONAL BIOPSY Bilateral over 10 years ago   benign x 2    BREAST LUMPECTOMY Right 10/10/2018   BREAST LUMPECTOMY WITH RADIOACTIVE SEED AND SENTINEL LYMPH NODE BIOPSY Right 10/10/2018   Procedure: RIGHT BREAST LUMPECTOMY WITH RADIOACTIVE SEED AND RIGHT SENTINEL LYMPH NODE BIOPSY;  Surgeon: Chevis Pretty III, MD;  Location: [facility];  Service: General;  Laterality: Right;   BREAST SURGERY     Benign breast lump excised   CERVICAL CONE BIOPSY  1985   severe dysplasia   COLONOSCOPY  2005 and dec 2019   normal   COLPOSCOPY     endoscopy  10/2018   ESOPHAGOGASTRODUODENOSCOPY (EGD) WITH PROPOFOL N/A  09/12/2020   Procedure: ESOPHAGOGASTRODUODENOSCOPY (EGD) WITH PROPOFOL;  Surgeon: Rachael Fee, MD;  Location: [facility];  Service: Endoscopy;  Laterality: N/A;   EUS N/A 09/12/2020   Procedure: UPPER ENDOSCOPIC ULTRASOUND (EUS) RADIAL;  Surgeon: Rachael Fee, MD;  Location: [facility];  Service: Endoscopy;  Laterality: N/A;   LAPAROSCOPIC ASSISTED VAGINAL HYSTERECTOMY     LAPAROSCOPIC BILATERAL SALPINGO OOPHERECTOMY Bilateral 10/27/2019   Procedure: LAPAROSCOPIC BILATERAL SALPINGO OOPHORECTOMY WITH PERITONEAL WASHINGS;  Surgeon: Genia Del, MD;  Location: [facility] [place];  Service: Gynecology;  Laterality: Bilateral;  request 1:00pm on Friday, Dec. 4th in [place] Iqueue time held requests one hour OR time   moles removed from  upper back, face lft, 1 between breasts, upper leg left inside  10/18/2019   wearing small round bandaids on for 2 weeks   ROTATOR CUFF REPAIR Bilateral 08/2008   UPPER GASTROINTESTINAL ENDOSCOPY      Current Outpatient Medications  Medication Sig Dispense Refill   ALPRAZolam (XANAX) 0.5 MG tablet TAKE 1/2 TO 1 TABLET BY MOUTH ONCE DAILY AS NEEDED FOR SLEEP/ANXIETY 15 tablet 2   atenolol (TENORMIN) 25 MG tablet Take 12.5 mg by mouth 3 (three) times daily. For migraine prevention     B Complex Vitamins (B COMPLEX PO) Take by mouth.     baclofen (LIORESAL) 10 MG tablet Take by mouth as needed.     CALCIUM CARBONATE-VITAMIN D PO Take 1 tablet by mouth daily. 1000 mg     famotidine (PEPCID) 40 MG tablet Take 40 mg by mouth 2 (two) times daily.      Galcanezumab-gnlm (EMGALITY) 120 MG/ML SOAJ Inject 120 mg into the skin every 30 (thirty) days. 1.12 mL 11   hydrocortisone 2.5 % cream Apply to affected areas BID PRN as directed. 30 g 3   hydrOXYzine (ATARAX) 25 MG tablet Take 37.5 mg by mouth at bedtime as needed.     letrozole (FEMARA) 2.5 MG tablet TAKE 1 TABLET BY MOUTH EVERY DAY 90 tablet 3   levocetirizine (XYZAL) 5 MG tablet Take 5 mg by mouth at bedtime.   3   levothyroxine (SYNTHROID) 75 MCG tablet TAKE 1 TABLET BY MOUTH DAILY BEFORE BREAKFAST 90 tablet 1   Magnesium Oxide 400 (240 Mg) MG TABS Take 400 mg by mouth daily.      metFORMIN (GLUCOPHAGE-XR) 500 MG 24 hr tablet TAKE 1 TABLET BY MOUTH EVERY DAY WITH BREAKFAST 90 tablet 1   methocarbamol (ROBAXIN) 500 MG tablet Take 500 mg by mouth 2 (two) times daily.     Multiple Vitamin (MULTIVITAMIN WITH MINERALS) TABS tablet Take 1 tablet by mouth daily.     nabumetone (RELAFEN) 500 MG tablet Take 500 mg by mouth 2 (two) times daily.     neomycin-polymyxin b-dexamethasone (MAXITROL) 3.5-10000-0.1 SUSP 1 drop 3 (three) times daily.     Omega-3 Fatty Acids (FISH OIL) 1000 MG CAPS Take 1,000 mg by mouth daily.      OnabotulinumtoxinA (BOTOX IJ)  Inject 1 Dose as directed every 6 (six) weeks.      ondansetron (ZOFRAN ODT) 4 MG disintegrating tablet Take 1 tablet (4 mg total) by mouth every 8 (eight) hours as needed. 20 tablet 0   pantoprazole (PROTONIX) 20 MG tablet Take 1 tablet (20 mg total) by mouth daily. 30 tablet 5   predniSONE (DELTASONE) 20 MG tablet Take 4 pills once daily by mouth for 3 days, then 3 pills daily for 3 days, then 2 pills daily for 3  days then 1 pill daily for 3 days then stop 30 tablet 0   PRESCRIPTION MEDICATION Inject into the skin every 6 (six) weeks. Steroid Trigger     Probiotic Product (PROBIOTIC-10 PO) Take 1 capsule by mouth daily.      promethazine (PHENERGAN) 25 MG tablet Take 25 mg by mouth daily as needed.     Rimegepant Sulfate (NURTEC) 75 MG TBDP Every other day 16 tablet 0   traMADol (ULTRAM) 50 MG tablet Take 0.5 tablets (25 mg total) by mouth every 8 (eight) hours as needed (cough). 10 tablet 0   triamcinolone (KENALOG) 0.1 % Apply to affected areas BID PRN for up to two weeks at a time as directed. 45 g 3   TURMERIC PO Take 1 tablet by mouth daily.     rizatriptan (MAXALT) 10 MG tablet Take 1 tablet (10 mg total) by mouth once as needed for up to 10 days for migraine. May repeat in 2 hours if needed 10 tablet 0   Current Facility-Administered Medications  Medication Dose Route Frequency Provider Last Rate Last Admin   Galcanezumab-gnlm SOAJ 240 mg  240 mg Subcutaneous Once         Allergies as of 06/30/2023 - Review Complete 06/30/2023  Allergen Reaction Noted   Ciprofloxacin Other (See Comments) 09/21/2018   Codeine phosphate Other (See Comments) 02/07/2007   Decongestant [pseudoephedrine] Other (See Comments) 09/21/2018   Diclofenac  12/23/2022   Flonase [fluticasone propionate] Other (See Comments) 11/13/2014   Hydrocodone Other (See Comments) 11/27/2008   Macrobid [nitrofurantoin]  01/22/2021   Nasonex [mometasone]  10/29/2021   Reglan [metoclopramide] Other (See Comments) 09/23/2018    Topamax [topiramate] Other (See Comments) 09/21/2018   Benadryl [diphenhydramine hcl] Palpitations 10/18/2017   Sulfa antibiotics Anxiety 09/05/2014    Vitals: BP 124/71   Pulse (!) 58   Ht 5' (1.524 m)   Wt 127 lb (57.6 kg)   BMI 24.80 kg/m  Last Weight:  Wt Readings from Last 1 Encounters:  06/30/23 127 lb (57.6 kg)   Last Height:   Ht Readings from Last 1 Encounters:  06/30/23 5' (1.524 m)     Physical exam: Exam: Gen: NAD, conversant, well nourised, obese, well groomed                     CV: RRR, no MRG. No Carotid Bruits. No peripheral edema, warm, nontender Eyes: Conjunctivae clear without exudates or hemorrhage  Neuro: Detailed Neurologic Exam  Speech:    Speech is normal; fluent and spontaneous with normal comprehension.  Cognition:    The patient is oriented to person, place, and time;     recent and remote memory intact;     language fluent;     normal attention, concentration,     fund of knowledge Cranial Nerves:    The pupils are equal, round, and reactive to light. The fundi are normal and spontaneous venous pulsations are present. Visual fields are full to finger confrontation. Extraocular movements are intact. Trigeminal sensation is intact and the muscles of mastication are normal. The face is symmetric. The palate elevates in the midline. Hearing intact. Voice is normal. Shoulder shrug is normal. The tongue has normal motion without fasciculations.   Coordination:    Normal finger to nose and heel to shin. Normal rapid alternating movements.   Gait:    Heel-toe and tandem gait are normal.   Motor Observation:    No asymmetry, no atrophy, and no involuntary movements noted.  Tone:    Normal muscle tone.    Posture:    Posture is normal. normal erect    Strength:    Strength is V/V in the upper and lower limbs.      Sensation: intact to LT     Reflex Exam:  DTR's:    Deep tendon reflexes in the upper and lower extremities are normal  bilaterally.   Toes:    The toes are downgoing bilaterally.   Clonus:    Clonus is absent.    Assessment/Plan:  Patient with chronic migraines for years  Adendum 07/29/2023: Better on Emgality now can prescribe nurtec. 6 migraine days a month and < 10 total headache days a month. Prescribe nurtec. Had side effects to Sumatriptan and Rizatriptan.  - Start Emgality month for prevention and prescribe - Samples of nurtec which can be used as prevention every other day or can use it as needed or daily once daily for a few weeks (long half life) - use every other day as a bridge this month - Bernita Raisin can be used acutely like a triptan : Please take one tablet at the onset of your headache. If it does not improve the symptoms please take one additional tablet. Do not take more then 2 tablets in 24hrs. Do not take use more then 2 to 3 times in a week. - hold off - Toradol IV injections at home - hold off on that - Can consider Ajovy, Qulipta, Vyepti as alternatives for prevention - As acute can consider other triptans(amerge, frovatriptan) or ubrelvy or zavzpret or nurtec  Non pharmaceutical treatments for migraines, discussed. Discussed new class of medications. Will inject emgality today and send to pharmacy. No need for imaging.  No orders of the defined types were placed in this encounter.  Meds ordered this encounter  Medications   Rimegepant Sulfate (NURTEC) 75 MG TBDP    Sig: Every other day    Dispense:  16 tablet    Refill:  0   Galcanezumab-gnlm SOAJ 240 mg   Galcanezumab-gnlm (EMGALITY) 120 MG/ML SOAJ    Sig: Inject 120 mg into the skin every 30 (thirty) days.    Dispense:  1.12 mL    Refill:  11    Cc: Tower, Audrie Gallus, MD,  Tower, Audrie Gallus, MD  Naomie Dean, MD  [facility] [address] [place], [place] [address]  Phone [phone] Fax [phone]    I spent over 60 minutes of face-to-face and non-face-to-face time with patient on the   1. Chronic migraine without aura, with intractable migraine, so stated, with status migrainosus    diagnosis.  This included previsit chart review, lab review, study review, order entry, electronic health record documentation, patient education on the different diagnostic and therapeutic options, counseling and coordination of care, risks and benefits of management, compliance, or risk factor reduction

## 2023-06-30 NOTE — Patient Instructions (Addendum)
-   Start Emgality month for prevention and prescribe - Samples of nurtec which can be used as prevention every other day or can use it as needed or daily once daily for a few weeks (long half life) - use every other day as a bridge this month - Bernita Raisin can be used acutely like a triptan : Please take one tablet at the onset of your headache. If it does not improve the symptoms please take one additional tablet. Do not take more then 2 tablets in 24hrs. Do not take use more then 2 to 3 times in a week. - hold off - Toradol IV injections at home - hold off on that - Can consider Ajovy, Qulipta, Vyepti as alternatives for prevention - As acute can consider other triptans(amerge, frovatriptan) or ubrelvy or zavzpret or nurtec  Non pharmaceutical treatments for migraines  Cefaly    Nerivio     gammaCore SapphireTM is the first and only FDA cleared non-invasive device to treat and prevent multiple types of headache pain via the vagus nerve. It's small, handheld and portable for quick and easy treatments whenever and wherever needed.     E-Neura - [email or web address]     Fascial release tools

## 2023-06-30 NOTE — Telephone Encounter (Signed)
Transition Care Management Unsuccessful Follow-up Telephone Call  Date of discharge and from where:  [facility] 8/1  Attempts:  1st Attempt  Reason for unsuccessful TCM follow-up call:  No answer/busy   Lenard Forth [facility] Guide, [facility] [phone] [address] [place], [place], [place] [address] Phone: [phone] Email: [email or web address]

## 2023-07-01 ENCOUNTER — Telehealth: Payer: Self-pay

## 2023-07-01 NOTE — Telephone Encounter (Signed)
Transition Care Management Unsuccessful Follow-up Telephone Call  Date of discharge and from where:  [facility] 8/1  Attempts:  2nd Attempt  Reason for unsuccessful TCM follow-up call:  No answer/busy   Lenard Forth [facility] Guide, [facility] [phone] [address] [place], [place], [place] [address] Phone: [phone] Email: [email or web address]

## 2023-07-05 ENCOUNTER — Encounter: Payer: Self-pay | Admitting: Neurology

## 2023-07-05 MED ORDER — GALCANEZUMAB-GNLM 120 MG/ML ~~LOC~~ SOAJ: 240.0000 mg | Freq: Once | SUBCUTANEOUS | Status: DC

## 2023-07-05 MED ORDER — EMGALITY 120 MG/ML ~~LOC~~ SOAJ: 120.0000 mg | SUBCUTANEOUS | 11 refills | Status: DC

## 2023-07-06 ENCOUNTER — Other Ambulatory Visit: Payer: Self-pay | Admitting: Family Medicine

## 2023-07-06 ENCOUNTER — Other Ambulatory Visit: Payer: Self-pay | Admitting: Gastroenterology

## 2023-07-06 ENCOUNTER — Ambulatory Visit (INDEPENDENT_AMBULATORY_CARE_PROVIDER_SITE_OTHER): Payer: Medicare HMO

## 2023-07-06 VITALS — Ht 59.0 in | Wt 127.0 lb

## 2023-07-06 DIAGNOSIS — Z0000 Encounter for general adult medical examination without abnormal findings: Secondary | ICD-10-CM

## 2023-07-06 MED ORDER — ALPRAZOLAM 0.5 MG PO TABS: ORAL_TABLET | ORAL | 2 refills | Status: DC

## 2023-07-06 NOTE — Telephone Encounter (Signed)
Name of Medication: Xanax Name of Pharmacy: [facility] Last Fill or Written Date and Quantity: 10/22/22 #15 tabs/ 2 refill  Last Office Visit and Type: back pain/migraine 03/05/23 Next Office Visit and Type: none scheduled

## 2023-07-06 NOTE — Telephone Encounter (Signed)
Prescription Request  07/06/2023  LOV: 03/05/2023  What is the name of the medication or equipment? ALPRAZolam (XANAX) 0.5 MG tablet   Have you contacted your pharmacy to request a refill? Yes   Which pharmacy would you like this sent to?  [id number], [place] - [address] [place] [address] Phone: [phone] Fax: [phone]  Patient notified that their request is being sent to the clinical staff for review and that they should receive a response within 2 business days.   Please advise at Mobile [phone] (mobile)

## 2023-07-06 NOTE — Progress Notes (Signed)
Subjective:   Jennifer [name] is a 67 y.o. female who presents for an Initial Medicare Annual Wellness Visit.  Visit Complete: Virtual  I connected with  Jennifer [name] on 07/06/23 by a audio enabled telemedicine application and verified that I am speaking with the correct person using two identifiers.  Patient Location: Other:  parked car  Provider Location: Home Office  I discussed the limitations of evaluation and management by telemedicine. The patient expressed understanding and agreed to proceed.  Vital Signs: Unable to obtain new vitals due to this being a telehealth visit. Pt reported ht and wt.  Review of Systems      Cardiac Risk Factors include: advanced age (>14men, >17 women);sedentary lifestyle     Objective:    Today's Vitals   07/06/23 1404 07/06/23 1405  Weight: 127 lb (57.6 kg)   Height: 4\' 11"  (1.499 m)   PainSc:  1    Body mass index is 25.65 kg/m.     07/06/2023    2:20 PM 06/24/2023    8:21 AM 05/28/2023    1:03 PM 05/21/2023    3:57 PM 03/01/2023   11:16 AM 01/09/2023    6:29 AM 12/24/2022    4:54 AM  Advanced Directives  Does Patient Have a Medical Advance Directive? No No No No Yes No No  Type of Agricultural consultant;Living will    Does patient want to make changes to medical advance directive?     No - Patient declined    Copy of Healthcare Power of Attorney in Chart?     No - copy requested    Would patient like information on creating a medical advance directive? No - Patient declined No - Patient declined  No - Patient declined   No - Patient declined    Current Medications (verified) Outpatient Encounter Medications as of 07/06/2023  Medication Sig   ALPRAZolam (XANAX) 0.5 MG tablet TAKE 1/2 TO 1 TABLET BY MOUTH ONCE DAILY AS NEEDED FOR SLEEP/ANXIETY   atenolol (TENORMIN) 25 MG tablet Take 12.5 mg by mouth 3 (three) times daily. For migraine prevention   B Complex Vitamins (B COMPLEX PO) Take by mouth.   baclofen  (LIORESAL) 10 MG tablet Take by mouth as needed.   CALCIUM CARBONATE-VITAMIN D PO Take 1 tablet by mouth daily. 1000 mg   famotidine (PEPCID) 40 MG tablet Take 40 mg by mouth 2 (two) times daily.    Galcanezumab-gnlm (EMGALITY) 120 MG/ML SOAJ Inject 120 mg into the skin every 30 (thirty) days.   hydrocortisone 2.5 % cream Apply to affected areas BID PRN as directed.   hydrOXYzine (ATARAX) 25 MG tablet Take 37.5 mg by mouth at bedtime as needed.   letrozole (FEMARA) 2.5 MG tablet TAKE 1 TABLET BY MOUTH EVERY DAY   levocetirizine (XYZAL) 5 MG tablet Take 5 mg by mouth at bedtime.    levothyroxine (SYNTHROID) 75 MCG tablet TAKE 1 TABLET BY MOUTH DAILY BEFORE BREAKFAST   Magnesium Oxide 400 (240 Mg) MG TABS Take 400 mg by mouth daily.    metFORMIN (GLUCOPHAGE-XR) 500 MG 24 hr tablet TAKE 1 TABLET BY MOUTH EVERY DAY WITH BREAKFAST   Multiple Vitamin (MULTIVITAMIN WITH MINERALS) TABS tablet Take 1 tablet by mouth daily.   nabumetone (RELAFEN) 500 MG tablet Take 500 mg by mouth 2 (two) times daily.   Omega-3 Fatty Acids (FISH OIL) 1000 MG CAPS Take 1,000 mg by mouth daily.  OnabotulinumtoxinA (BOTOX IJ) Inject 1 Dose as directed every 6 (six) weeks.    ondansetron (ZOFRAN ODT) 4 MG disintegrating tablet Take 1 tablet (4 mg total) by mouth every 8 (eight) hours as needed.   pantoprazole (PROTONIX) 20 MG tablet TAKE 1 TABLET BY MOUTH EVERY DAY   Probiotic Product (PROBIOTIC-10 PO) Take 1 capsule by mouth daily.    promethazine (PHENERGAN) 25 MG tablet Take 25 mg by mouth daily as needed.   Rimegepant Sulfate (NURTEC) 75 MG TBDP Every other day   traMADol (ULTRAM) 50 MG tablet Take 0.5 tablets (25 mg total) by mouth every 8 (eight) hours as needed (cough).   triamcinolone (KENALOG) 0.1 % Apply to affected areas BID PRN for up to two weeks at a time as directed.   TURMERIC PO Take 1 tablet by mouth daily.   methocarbamol (ROBAXIN) 500 MG tablet Take 500 mg by mouth 2 (two) times daily. (Patient not  taking: Reported on 07/06/2023)   neomycin-polymyxin b-dexamethasone (MAXITROL) 3.5-10000-0.1 SUSP 1 drop 3 (three) times daily. (Patient not taking: Reported on 07/06/2023)   predniSONE (DELTASONE) 20 MG tablet Take 4 pills once daily by mouth for 3 days, then 3 pills daily for 3 days, then 2 pills daily for 3 days then 1 pill daily for 3 days then stop (Patient not taking: Reported on 07/06/2023)   PRESCRIPTION MEDICATION Inject into the skin every 6 (six) weeks. Steroid Trigger (Patient not taking: Reported on 07/06/2023)   rizatriptan (MAXALT) 10 MG tablet Take 1 tablet (10 mg total) by mouth once as needed for up to 10 days for migraine. May repeat in 2 hours if needed   [DISCONTINUED] ALPRAZolam (XANAX) 0.5 MG tablet TAKE 1/2 TO 1 TABLET BY MOUTH ONCE DAILY AS NEEDED FOR SLEEP/ANXIETY   Facility-Administered Encounter Medications as of 07/06/2023  Medication   Galcanezumab-gnlm SOAJ 240 mg    Allergies (verified) Ciprofloxacin, Codeine phosphate, Decongestant [pseudoephedrine], Diclofenac, Flonase [fluticasone propionate], Hydrocodone, Macrobid [nitrofurantoin], Nasonex [mometasone], Reglan [metoclopramide], Topamax [topiramate], Benadryl [diphenhydramine hcl], and Sulfa antibiotics   History: Past Medical History:  Diagnosis Date   Allergic rhinitis    Allergy    seasonal   Anemia    Anxiety    Arthritis    hands, knees   Asthma    "as a child"   Blood transfusion without reported diagnosis    "as a child"   Breast cancer (HCC) 2019   Right Breast Cancer   Cancer (HCC) 09/2018   Breast CA new DX, right breast   Cervical dysplasia    Common bile duct dilation    Diabetes mellitus without complication (HCC)    Endometriosis    Esophageal reflux    Family history of adverse reaction to anesthesia    younger sister anxiety for a dew days after   Family history of breast cancer    Family history of breast cancer    Family history of prostate cancer    Hepatic cyst 08/2020    multiple   Hepatic hemangioma 08/2020   intrahepatic hemangioma   Hiatal hernia    HSV-1 (herpes simplex virus 1) infection    Hypertension    pt.denies 10/19/22   Hypothyroid    Migraine with aura, without mention of intractable migraine without mention of status migrainosus    Osteopenia 08/2019   T score -2.2 FRAX 11% / 1.7%   Personal history of radiation therapy 2019   Right Breast Cancer   Pre-diabetes    Raynaud's disease  Past Surgical History:  Procedure Laterality Date   BIOPSY  09/12/2020   Procedure: BIOPSY;  Surgeon: Rachael Fee, MD;  Location: [facility];  Service: Endoscopy;;   BREAST BIOPSY Left 11/27/2022   MM LT BREAST BX W LOC DEV 1ST LESION IMAGE BX SPEC STEREO GUIDE 11/27/2022 [facility]   BREAST EXCISIONAL BIOPSY Bilateral over 10 years ago   benign x 2    BREAST LUMPECTOMY Right 10/10/2018   BREAST LUMPECTOMY WITH RADIOACTIVE SEED AND SENTINEL LYMPH NODE BIOPSY Right 10/10/2018   Procedure: RIGHT BREAST LUMPECTOMY WITH RADIOACTIVE SEED AND RIGHT SENTINEL LYMPH NODE BIOPSY;  Surgeon: Chevis Pretty III, MD;  Location: [facility];  Service: General;  Laterality: Right;   BREAST SURGERY     Benign breast lump excised   CERVICAL CONE BIOPSY  1985   severe dysplasia   COLONOSCOPY  2005 and dec 2019   normal   COLPOSCOPY     endoscopy  10/2018   ESOPHAGOGASTRODUODENOSCOPY (EGD) WITH PROPOFOL N/A 09/12/2020   Procedure: ESOPHAGOGASTRODUODENOSCOPY (EGD) WITH PROPOFOL;  Surgeon: Rachael Fee, MD;  Location: [facility];  Service: Endoscopy;  Laterality: N/A;   EUS N/A 09/12/2020   Procedure: UPPER ENDOSCOPIC ULTRASOUND (EUS) RADIAL;  Surgeon: Rachael Fee, MD;  Location: [facility];  Service: Endoscopy;  Laterality: N/A;   LAPAROSCOPIC ASSISTED VAGINAL HYSTERECTOMY     LAPAROSCOPIC BILATERAL SALPINGO OOPHERECTOMY Bilateral 10/27/2019   Procedure: LAPAROSCOPIC BILATERAL SALPINGO OOPHORECTOMY WITH PERITONEAL WASHINGS;  Surgeon:  Genia Del, MD;  Location: [facility];  Service: Gynecology;  Laterality: Bilateral;  request 1:00pm on Friday, Dec. 4th in [place] Iqueue time held requests one hour OR time   moles removed from upper back, face lft, 1 between breasts, upper leg left inside  10/18/2019   wearing small round bandaids on for 2 weeks   ROTATOR CUFF REPAIR Bilateral 08/2008   UPPER GASTROINTESTINAL ENDOSCOPY     Family History  Problem Relation Age of Onset   Breast cancer Mother 45   Diabetes Mother    Irritable bowel syndrome Mother    Hypertension Father    Heart disease Father    Hypertension Sister    Anuerysm Sister        head   Irritable bowel syndrome Sister    Hypertension Sister    Anxiety disorder Sister    Other Sister        prediabetes   Celiac disease Brother    Osteoporosis Maternal Grandmother    Diabetes Maternal Grandmother    COPD Paternal Grandmother    Heart disease Paternal Grandfather    Breast cancer Cousin 42       pat first cousin   Breast cancer Cousin        mat second cousin with breast cancer in her 30s   Thyroid cancer Cousin 9       pat first cousin   Prostate cancer Other 19   Colon cancer Neg Hx    Stomach cancer Neg Hx    Esophageal cancer Neg Hx    Pancreatic cancer Neg Hx    Rectal cancer Neg Hx    Colon polyps Neg Hx    Crohn's disease Neg Hx    Ulcerative colitis Neg Hx    Social History   Socioeconomic History   Marital status: Widowed    Spouse name: Not on file   Number of children: Not on file   Years of education: Not on file  Highest education level: Bachelor's degree (e.g., BA, AB, BS)  Occupational History   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   Social Determinants of Health   Financial Resource Strain: Low Risk  (07/06/2023)   Overall Financial Resource Strain (CARDIA)    Difficulty of Paying Living Expenses: Not hard at all  Food Insecurity: No Food Insecurity (07/06/2023)   Hunger Vital Sign    Worried About Running Out of Food in the Last Year: Never true    Ran Out of Food in the Last Year: Never true  Transportation Needs: No Transportation Needs (07/06/2023)   PRAPARE - Administrator, Civil Service (Medical): No    Lack of Transportation (Non-Medical): No  Physical Activity: Insufficiently Active (07/06/2023)   Exercise Vital Sign    Days of Exercise per Week: 3 days    Minutes of Exercise per Session: 40 min  Stress: No Stress Concern Present (07/06/2023)   [employer] of Occupational Health - Occupational Stress Questionnaire    Feeling of Stress : Not at all  Social Connections: Moderately Integrated (07/06/2023)   Social Connection and Isolation Panel [NHANES]    Frequency of Communication with Friends and Family: More than three times a week    Frequency of Social Gatherings with Friends and Family: More than three times a week    Attends Religious Services: More than 4 times per year    Active Member of [employer] or Organizations: Yes    Attends Banker Meetings: More than 4 times per year    Marital Status: Widowed    Tobacco Counseling Counseling given: Not Answered   Clinical Intake:  Pre-visit preparation completed: Yes  Pain : 0-10 Pain Score: 1  Pain Type: Acute pain Pain Location: Back Pain Orientation: Upper Pain Descriptors / Indicators: Aching Pain Onset: Yesterday     BMI - recorded: 25.65 Nutritional Status:  BMI 25 -29 Overweight Nutritional Risks: Unintentional weight gain (over last 5 years due to inactivity) Diabetes: No  How often do you need to have someone help you when you read instructions, pamphlets, or other written materials from your doctor or pharmacy?: 1 - Never  Interpreter Needed?: No  Information entered by :: C. LPN   Activities of Daily Living    07/06/2023    2:23 PM  In your present state of health, do you have any difficulty performing the following activities:  Hearing? 0  Vision? 0  Difficulty concentrating or making decisions? 0  Walking or climbing stairs? 0  Dressing or bathing? 0  Doing errands, shopping? 0  Preparing Food and eating ? N  Using the Toilet? N  In the past six months, have you accidently leaked urine? N  Do you have problems with loss of bowel control? N  Managing your Medications? N  Managing your Finances? N  Housekeeping or managing your Housekeeping? N    Patient Care Team:  Tower, Audrie Gallus, MD as PCP - General (Family Medicine) Serena Croissant, MD as Consulting Physician (Hematology and Oncology) Dorothy Puffer, MD as Consulting Physician (Radiation Oncology) Griselda Miner, MD as Consulting Physician (General Surgery)  Indicate any recent Medical Services you may have received from other than [facility] providers in the past year (date may be approximate).     Assessment:   This is a routine wellness examination for Jennifer [name].  Hearing/Vision screen Hearing Screening - Comments:: Slight hearing loss Vision Screening - Comments:: Glasses for reading - [facility] - UTD on eye exams  Dietary issues and exercise activities discussed:     Goals Addressed             This Visit's Progress    Patient Stated       Get BMI down to 22.       Depression Screen    07/06/2023    2:11 PM 01/21/2023    4:00 PM 06/17/2021    2:51 PM 03/01/2020   12:17 PM 01/03/2019    4:51 PM 11/10/2017   12:29 PM 10/28/2012   10:43 AM  PHQ 2/9  Scores  PHQ - 2 Score 0 0 0 0 0 2 0  PHQ- 9 Score  0 1 1  2      Fall Risk    07/06/2023    2:21 PM 02/16/2023    8:55 AM 02/02/2023   11:12 AM 01/21/2023    3:59 PM  Fall Risk   Falls in the past year? 1 0 1 1  Number falls in past yr: 0 0 0 0  Comment put foot on counter and fell backwards     Injury with Fall? 1 0 1 0  Comment back  muscle arm & shoulder pain   Risk for fall due to : No Fall Risks  History of fall(s) History of fall(s)  Follow up Falls prevention discussed;Falls evaluation completed Education provided;Falls prevention discussed;Falls evaluation completed Falls evaluation completed Falls evaluation completed    MEDICARE RISK AT HOME:  Medicare Risk at Home - 07/06/23 1424     Any stairs in or around the home? Yes    If so, are there any without handrails? No    Home free of loose throw rugs in walkways, pet beds, electrical cords, etc? Yes    Adequate lighting in your home to reduce risk of falls? Yes    Life alert? No    Use of a cane, walker or w/c? No    Grab bars in the bathroom? Yes    Shower chair or bench in shower? Yes    Elevated toilet seat or a handicapped toilet? Yes             TIMED UP AND GO:  Was the test performed? No    Cognitive Function:        07/06/2023    2:24 PM  6CIT Screen  What Year? 0 points  What month? 0 points  What time? 0 points  Count back from 20 0 points  Months in reverse 0 points  Repeat phrase 0 points  Total Score 0 points    Immunizations Immunization History  Administered Date(s) Administered   Fluad Quad(high Dose 65+) 09/18/2022   Influenza Inj Mdck Quad Pf 08/11/2019   Influenza Whole 08/21/2009, 08/28/2010   Influenza, High Dose Seasonal PF 10/28/2017, 09/13/2018, 10/10/2019, 10/22/2020, 10/10/2021   Influenza,inj,Quad PF,6+ Mos 08/28/2015, 08/03/2016, 08/05/2018   Influenza-Unspecified 09/06/2017, 08/03/2020   PFIZER Comirnaty(Gray  Top)Covid-19 Tri-Sucrose Vaccine 06/14/2021    PFIZER(Purple Top)SARS-COV-2 Vaccination 01/05/2020, 01/30/2020, 09/05/2020, 10/22/2020   PNEUMOCOCCAL CONJUGATE-20 11/18/2021   Pfizer Covid-19 Vaccine Bivalent Booster 54yrs & up 11/11/2021   Pneumococcal Polysaccharide-23 10/28/2017, 10/10/2019, 10/22/2020   Tdap 10/28/2012   Zoster Recombinant(Shingrix) 08/05/2018, 09/24/2018    TDAP status: Due, Education has been provided regarding the importance of this vaccine. Advised may receive this vaccine at local pharmacy or Health Dept. Aware to provide a copy of the vaccination record if obtained from local pharmacy or Health Dept. Verbalized acceptance and understanding.  Flu Vaccine status: Due, Education has been provided regarding the importance of this vaccine. Advised may receive this vaccine at local pharmacy or Health Dept. Aware to provide a copy of the vaccination record if obtained from local pharmacy or Health Dept. Verbalized acceptance and understanding.  Pneumococcal vaccine status: Up to date  Covid-19 vaccine status: Information provided on how to obtain vaccines.   Qualifies for Shingles Vaccine? Yes   Zostavax completed No   Shingrix Completed?: Yes  Screening Tests Health Maintenance  Topic Date Due   COVID-19 Vaccine (7 - 2023-24 season) 07/24/2022   DTaP/Tdap/Td (2 - Td or Tdap) 10/28/2022   INFLUENZA VACCINE  06/24/2023   Medicare Annual Wellness (AWV)  07/05/2024   MAMMOGRAM  11/19/2024   Colonoscopy  08/20/2026   Pneumonia Vaccine 55+ Years old  Completed   DEXA SCAN  Completed   Hepatitis C Screening  Completed   Zoster Vaccines- Shingrix  Completed   HPV VACCINES  Aged Out    Health Maintenance  Health Maintenance Due  Topic Date Due   COVID-19 Vaccine (7 - 2023-24 season) 07/24/2022   DTaP/Tdap/Td (2 - Td or Tdap) 10/28/2022   INFLUENZA VACCINE  06/24/2023    Colorectal cancer screening: Type of screening: Colonoscopy. Completed 08/20/21. Repeat every 5 years  Mammogram status: Completed  12/08/22. Repeat every year  Bone Density status: Ordered 02/02/23. Pt provided with contact info and advised to call to schedule appt.  Lung Cancer Screening: (Low Dose CT Chest recommended if Age 77-80 years, 20 pack-year currently smoking OR have quit w/in 15years.) does not qualify.   Lung Cancer Screening Referral: no  Additional Screening:  Hepatitis C Screening: does qualify; Completed 10/04/19  Vision Screening: Recommended annual ophthalmology exams for early detection of glaucoma and other disorders of the eye. Is the patient up to date with their annual eye exam?  Yes  Who is the provider or what is the name of the office in which the patient attends annual eye exams? [facility] If pt is not established with a provider, would they like to be referred to a provider to establish care? No .   Dental Screening: Recommended annual dental exams for proper oral hygiene    Community Resource Referral / Chronic Care Management: CRR required this visit?  No   CCM required this visit?  No     Plan:     I have personally reviewed and noted the following in the patient's chart:   Medical and social history Use of alcohol, tobacco or illicit drugs  Current medications and supplements including opioid prescriptions. Patient is currently taking opioid prescriptions. Information provided to patient regarding non-opioid alternatives. Patient advised to discuss non-opioid treatment plan with their provider. Functional ability and status Nutritional status Physical activity Advanced directives List of other physicians Hospitalizations, surgeries, and ER visits in previous 12 months Vitals Screenings to include cognitive, depression, and falls Referrals and appointments  In  addition, I have reviewed and discussed with patient certain preventive protocols, quality metrics, and best practice recommendations. A written personalized care plan for preventive services as well as  general preventive health recommendations were provided to patient.     Maryan Puls, LPN   07/21/5620   After Visit Summary: (MyChart) Due to this being a telephonic visit, the after visit summary with patients personalized plan was offered to patient via MyChart   Nurse Notes: Pt was upset over scheduling error, I apologised to pt, awv was completed and pt expressed satisfaction with how the visit was handled.

## 2023-07-06 NOTE — Patient Instructions (Addendum)
Jennifer [name] , Thank you for taking time to come for your Medicare Wellness Visit. I appreciate your ongoing commitment to your health goals. Please review the following plan we discussed and let me know if I can assist you in the future.   Referrals/Orders/Follow-Ups/Clinician Recommendations: Aim for 30 minutes of exercise or brisk walking, 6-8 glasses of water, and 5 servings of fruits and vegetables each day.   Managing Pain Without Opioids Opioids are strong medicines used to treat moderate to severe pain. For some people, especially those who have long-term (chronic) pain, opioids may not be the best choice for pain management due to: Side effects like nausea, constipation, and sleepiness. The risk of addiction (opioid use disorder). The longer you take opioids, the greater your risk of addiction. Pain that lasts for more than 3 months is called chronic pain. Managing chronic pain usually requires more than one approach and is often provided by a team of health care providers working together (multidisciplinary approach). Pain management may be done at a pain management center or pain clinic. How to manage pain without the use of opioids Use non-opioid medicines Non-opioid medicines for pain may include: Over-the-counter or prescription non-steroidal anti-inflammatory drugs (NSAIDs). These may be the first medicines used for pain. They work well for muscle and bone pain, and they reduce swelling. Acetaminophen. This over-the-counter medicine may work well for milder pain but not swelling. Antidepressants. These may be used to treat chronic pain. A certain type of antidepressant (tricyclics) is often used. These medicines are given in lower doses for pain than when used for depression. Anticonvulsants. These are usually used to treat seizures but may also reduce nerve (neuropathic) pain. Muscle relaxants. These relieve pain caused by sudden muscle tightening (spasms). You may also use a pain  medicine that is applied to the skin as a patch, cream, or gel (topical analgesic), such as a numbing medicine. These may cause fewer side effects than medicines taken by mouth. Do certain therapies as directed Some therapies can help with pain management. They include: Physical therapy. You will do exercises to gain strength and flexibility. A physical therapist may teach you exercises to move and stretch parts of your body that are weak, stiff, or painful. You can learn these exercises at physical therapy visits and practice them at home. Physical therapy may also involve: Massage. Heat wraps or applying heat or cold to affected areas. Electrical signals that interrupt pain signals (transcutaneous electrical nerve stimulation, TENS). Weak lasers that reduce pain and swelling (low-level laser therapy). Signals from your body that help you learn to regulate pain (biofeedback). Occupational therapy. This helps you to learn ways to function at home and work with less pain. Recreational therapy. This involves trying new activities or hobbies, such as a physical activity or drawing. Mental health therapy, including: Cognitive behavioral therapy (CBT). This helps you learn coping skills for dealing with pain. Acceptance and commitment therapy (ACT) to change the way you think and react to pain. Relaxation therapies, including muscle relaxation exercises and mindfulness-based stress reduction. Pain management counseling. This may be individual, family, or group counseling.  Receive medical treatments Medical treatments for pain management include: Nerve block injections. These may include a pain blocker and anti-inflammatory medicines. You may have injections: Near the spine to relieve chronic back or neck pain. Into joints to relieve back or joint pain. Into nerve areas that supply a painful area to relieve body pain. Into muscles (trigger point injections) to relieve some painful muscle  conditions. A medical device placed near your spine to help block pain signals and relieve nerve pain or chronic back pain (spinal cord stimulation device). Acupuncture. Follow these instructions at home Medicines Take over-the-counter and prescription medicines only as told by your health care provider. If you are taking pain medicine, ask your health care providers about possible side effects to watch out for. Do not drive or use heavy machinery while taking prescription opioid pain medicine. Lifestyle  Do not use drugs or alcohol to reduce pain. If you drink alcohol, limit how much you have to: 0-1 drink a day for women who are not pregnant. 0-2 drinks a day for men. Know how much alcohol is in a drink. In the [place] one drink equals one 12 oz bottle of beer (355 mL), one 5 oz glass of wine (148 mL), or one 1 oz glass of hard liquor (44 mL). Do not use any products that contain nicotine or tobacco. These products include cigarettes, chewing tobacco, and vaping devices, such as e-cigarettes. If you need help quitting, ask your health care provider. Eat a healthy diet and maintain a healthy weight. Poor diet and excess weight may make pain worse. Eat foods that are high in fiber. These include fresh fruits and vegetables, whole grains, and beans. Limit foods that are high in fat and processed sugars, such as fried and sweet foods. Exercise regularly. Exercise lowers stress and may help relieve pain. Ask your health care provider what activities and exercises are safe for you. If your health care provider approves, join an exercise class that combines movement and stress reduction. Examples include yoga and tai chi. Get enough sleep. Lack of sleep may make pain worse. Lower stress as much as possible. Practice stress reduction techniques as told by your therapist. General instructions Work with all your pain management providers to find the treatments that work best for you. You are an  important member of your pain management team. There are many things you can do to reduce pain on your own. Consider joining an online or in-person support group for people who have chronic pain. Keep all follow-up visits. This is important. Where to find more information You can find more information about managing pain without opioids from: American Academy of Pain Medicine: painmed.org Institute for Chronic Pain: instituteforchronicpain.org American Chronic Pain Association: theacpa.org Contact a health care provider if: You have side effects from pain medicine. Your pain gets worse or does not get better with treatments or home therapy. You are struggling with anxiety or depression. Summary Many types of pain can be managed without opioids. Chronic pain may respond better to pain management without opioids. Pain is best managed when you and a team of health care providers work together. Pain management without opioids may include non-opioid medicines, medical treatments, physical therapy, mental health therapy, and lifestyle changes. Tell your health care providers if your pain gets worse or is not being managed well enough. This information is not intended to replace advice given to you by your health care provider. Make sure you discuss any questions you have with your health care provider. Document Revised: 02/19/2021 Document Reviewed: 02/19/2021 Elsevier Patient Education  2024 [employer].   This is a list of the screening recommended for you and due dates:  Health Maintenance  Topic Date Due   Medicare Annual Wellness Visit  Never done   COVID-19 Vaccine (7 - 2023-24 season) 07/24/2022   DTaP/Tdap/Td vaccine (2 - Td or Tdap) 10/28/2022  Flu Shot  06/24/2023   Mammogram  11/19/2024   Colon Cancer Screening  08/20/2026   Pneumonia Vaccine  Completed   DEXA scan (bone density measurement)  Completed   Hepatitis C Screening  Completed   Zoster (Shingles) Vaccine  Completed    HPV Vaccine  Aged Out    Advanced directives: (Copy Requested) Please bring a copy of your health care power of attorney and living will to the office to be added to your chart at your convenience.  Next Medicare Annual Wellness Visit scheduled for next year: Yes  Preventive Care 87 Years and Older, Female Preventive care refers to lifestyle choices and visits with your health care provider that can promote health and wellness. What does preventive care include? A yearly physical exam. This is also called an annual well check. Dental exams once or twice a year. Routine eye exams. Ask your health care provider how often you should have your eyes checked. Personal lifestyle choices, including: Daily care of your teeth and gums. Regular physical activity. Eating a healthy diet. Avoiding tobacco and drug use. Limiting alcohol use. Practicing safe sex. Taking low-dose aspirin every day. Taking vitamin and mineral supplements as recommended by your health care provider. What happens during an annual well check? The services and screenings done by your health care provider during your annual well check will depend on your age, overall health, lifestyle risk factors, and family history of disease. Counseling  Your health care provider may ask you questions about your: Alcohol use. Tobacco use. Drug use. Emotional well-being. Home and relationship well-being. Sexual activity. Eating habits. History of falls. Memory and ability to understand (cognition). Work and work Astronomer. Reproductive health. Screening  You may have the following tests or measurements: Height, weight, and BMI. Blood pressure. Lipid and cholesterol levels. These may be checked every 5 years, or more frequently if you are over 82 years old. Skin check. Lung cancer screening. You may have this screening every year starting at age 15 if you have a 30-pack-year history of smoking and currently smoke or have  quit within the past 15 years. Fecal occult blood test (FOBT) of the stool. You may have this test every year starting at age 25. Flexible sigmoidoscopy or colonoscopy. You may have a sigmoidoscopy every 5 years or a colonoscopy every 10 years starting at age 85. Hepatitis C blood test. Hepatitis B blood test. Sexually transmitted disease (STD) testing. Diabetes screening. This is done by checking your blood sugar (glucose) after you have not eaten for a while (fasting). You may have this done every 1-3 years. Bone density scan. This is done to screen for osteoporosis. You may have this done starting at age 59. Mammogram. This may be done every 1-2 years. Talk to your health care provider about how often you should have regular mammograms. Talk with your health care provider about your test results, treatment options, and if necessary, the need for more tests. Vaccines  Your health care provider may recommend certain vaccines, such as: Influenza vaccine. This is recommended every year. Tetanus, diphtheria, and acellular pertussis (Tdap, Td) vaccine. You may need a Td booster every 10 years. Zoster vaccine. You may need this after age 22. Pneumococcal 13-valent conjugate (PCV13) vaccine. One dose is recommended after age 64. Pneumococcal polysaccharide (PPSV23) vaccine. One dose is recommended after age 68. Talk to your health care provider about which screenings and vaccines you need and how often you need them. This information is not intended to replace  advice given to you by your health care provider. Make sure you discuss any questions you have with your health care provider. Document Released: 12/06/2015 Document Revised: 07/29/2016 Document Reviewed: 09/10/2015 Elsevier Interactive Patient Education  2017 [employer].  Fall Prevention in the Home Falls can cause injuries. They can happen to people of all ages. There are many things you can do to make your home safe and to help prevent  falls. What can I do on the outside of my home? Regularly fix the edges of walkways and driveways and fix any cracks. Remove anything that might make you trip as you walk through a door, such as a raised step or threshold. Trim any bushes or trees on the path to your home. Use bright outdoor lighting. Clear any walking paths of anything that might make someone trip, such as rocks or tools. Regularly check to see if handrails are loose or broken. Make sure that both sides of any steps have handrails. Any raised decks and porches should have guardrails on the edges. Have any leaves, snow, or ice cleared regularly. Use sand or salt on walking paths during winter. Clean up any spills in your garage right away. This includes oil or grease spills. What can I do in the bathroom? Use night lights. Install grab bars by the toilet and in the tub and shower. Do not use towel bars as grab bars. Use non-skid mats or decals in the tub or shower. If you need to sit down in the shower, use a plastic, non-slip stool. Keep the floor dry. Clean up any water that spills on the floor as soon as it happens. Remove soap buildup in the tub or shower regularly. Attach bath mats securely with double-sided non-slip rug tape. Do not have throw rugs and other things on the floor that can make you trip. What can I do in the bedroom? Use night lights. Make sure that you have a light by your bed that is easy to reach. Do not use any sheets or blankets that are too big for your bed. They should not hang down onto the floor. Have a firm chair that has side arms. You can use this for support while you get dressed. Do not have throw rugs and other things on the floor that can make you trip. What can I do in the kitchen? Clean up any spills right away. Avoid walking on wet floors. Keep items that you use a lot in easy-to-reach places. If you need to reach something above you, use a strong step stool that has a grab  bar. Keep electrical cords out of the way. Do not use floor polish or wax that makes floors slippery. If you must use wax, use non-skid floor wax. Do not have throw rugs and other things on the floor that can make you trip. What can I do with my stairs? Do not leave any items on the stairs. Make sure that there are handrails on both sides of the stairs and use them. Fix handrails that are broken or loose. Make sure that handrails are as long as the stairways. Check any carpeting to make sure that it is firmly attached to the stairs. Fix any carpet that is loose or worn. Avoid having throw rugs at the top or bottom of the stairs. If you do have throw rugs, attach them to the floor with carpet tape. Make sure that you have a light switch at the top of the stairs and the bottom  of the stairs. If you do not have them, ask someone to add them for you. What else can I do to help prevent falls? Wear shoes that: Do not have high heels. Have rubber bottoms. Are comfortable and fit you well. Are closed at the toe. Do not wear sandals. If you use a stepladder: Make sure that it is fully opened. Do not climb a closed stepladder. Make sure that both sides of the stepladder are locked into place. Ask someone to hold it for you, if possible. Clearly mark and make sure that you can see: Any grab bars or handrails. First and last steps. Where the edge of each step is. Use tools that help you move around (mobility aids) if they are needed. These include: Canes. Walkers. Scooters. Crutches. Turn on the lights when you go into a dark area. Replace any light bulbs as soon as they burn out. Set up your furniture so you have a clear path. Avoid moving your furniture around. If any of your floors are uneven, fix them. If there are any pets around you, be aware of where they are. Review your medicines with your doctor. Some medicines can make you feel dizzy. This can increase your chance of falling. Ask  your doctor what other things that you can do to help prevent falls. This information is not intended to replace advice given to you by your health care provider. Make sure you discuss any questions you have with your health care provider. Document Released: 09/05/2009 Document Revised: 04/16/2016 Document Reviewed: 12/14/2014 Elsevier Interactive Patient Education  2017 [employer].

## 2023-07-08 NOTE — Telephone Encounter (Signed)
Left message for patient to call office.  

## 2023-07-09 ENCOUNTER — Inpatient Hospital Stay: Payer: Medicare HMO

## 2023-07-09 ENCOUNTER — Telehealth: Payer: Self-pay

## 2023-07-09 ENCOUNTER — Other Ambulatory Visit (HOSPITAL_COMMUNITY): Payer: Self-pay

## 2023-07-09 ENCOUNTER — Encounter: Payer: Self-pay | Admitting: Hematology and Oncology

## 2023-07-09 NOTE — Telephone Encounter (Signed)
Pharmacy Patient Advocate Encounter   Received notification from CoverMyMeds that prior authorization for Emgality 120MG /ML auto-injectors (migraine) is required/requested.   Insurance verification completed.   The patient is insured through [employer] .   Per test claim: PA required; PA submitted to [facility] via CoverMyMeds Key/confirmation #/EOC [facility] Status is pending

## 2023-07-10 ENCOUNTER — Other Ambulatory Visit: Payer: Self-pay

## 2023-07-10 ENCOUNTER — Encounter: Payer: Self-pay | Admitting: Hematology and Oncology

## 2023-07-10 ENCOUNTER — Emergency Department
Admission: EM | Admit: 2023-07-10 | Discharge: 2023-07-10 | Disposition: A | Discharge status: Home / Self Care | Payer: Medicare HMO | Attending: Emergency Medicine | Admitting: Emergency Medicine

## 2023-07-10 DIAGNOSIS — G43009 Migraine without aura, not intractable, without status migrainosus: Secondary | ICD-10-CM | POA: Insufficient documentation

## 2023-07-10 DIAGNOSIS — R519 Headache, unspecified: Secondary | ICD-10-CM | POA: Diagnosis present

## 2023-07-10 MED ORDER — ONDANSETRON HCL 4 MG/2ML IJ SOLN
4.0000 mg | Freq: Once | INTRAMUSCULAR | Status: AC
  Administered 2023-07-10: 4 mg via INTRAVENOUS
  Filled 2023-07-10: qty 2

## 2023-07-10 MED ORDER — SODIUM CHLORIDE 0.9 % IV BOLUS
1000.0000 mL | Freq: Once | INTRAVENOUS | Status: AC
  Administered 2023-07-10: 1000 mL via INTRAVENOUS

## 2023-07-10 MED ORDER — KETOROLAC TROMETHAMINE 15 MG/ML IJ SOLN
15.0000 mg | Freq: Once | INTRAMUSCULAR | Status: AC
  Administered 2023-07-10: 15 mg via INTRAVENOUS
  Filled 2023-07-10: qty 1

## 2023-07-10 MED ORDER — DEXAMETHASONE SODIUM PHOSPHATE 10 MG/ML IJ SOLN
10.0000 mg | Freq: Once | INTRAMUSCULAR | Status: AC
  Administered 2023-07-10: 10 mg via INTRAVENOUS
  Filled 2023-07-10: qty 1

## 2023-07-10 NOTE — ED Provider Notes (Addendum)
[facility] Provider Note    Event Date/Time   First MD Initiated Contact with Patient 07/10/23 1102     (approximate)   History   Migraine   HPI Jennifer [name] is a 67 y.o. female with chronic migraines presenting today for migraine.  Patient is seeing a new neurologist outpatient who changed up her regimen approximately 10 days ago.  She is still getting used to it and has had difficulty treating her migraine outpatient.  She feels like she has had this headache for 3 days and feels like her typical migraine.  No vision changes, unilateral weakness, numbness.  No fevers or chills.  Does not describe any acute onset headache worse than her baseline.  Typical regiment of fluids, Toradol, Zofran, and Decadron has been helpful in the past.     Physical Exam   Triage Vital Signs: ED Triage Vitals  Encounter Vitals Group     BP 07/10/23 1030 132/67     Systolic BP Percentile --      Diastolic BP Percentile --      Pulse Rate 07/10/23 1030 77     Resp 07/10/23 1030 16     Temp 07/10/23 1030 98.7 F (37.1 C)     Temp Source 07/10/23 1030 Oral     SpO2 07/10/23 1030 99 %     Weight --      Height --      Head Circumference --      Peak Flow --      Pain Score 07/10/23 1034 2     Pain Loc --      Pain Education --      Exclude from Growth Chart --     Most recent vital signs: Vitals:   07/10/23 1030  BP: 132/67  Pulse: 77  Resp: 16  Temp: 98.7 F (37.1 C)  SpO2: 99%   Physical Exam: I have reviewed the vital signs and nursing notes. General: Awake, alert, no acute distress.  Nontoxic appearing. Head:  Atraumatic, normocephalic.   ENT:  EOM intact, PERRL. Oral mucosa is pink and moist with no lesions. Neck: Neck is supple with full range of motion, No meningeal signs. Cardiovascular:  RRR, No murmurs. Peripheral pulses palpable and equal bilaterally. Respiratory:  Symmetrical chest wall expansion.  No rhonchi, rales, or wheezes.  Good air  movement throughout.  No use of accessory muscles.   Musculoskeletal:  No cyanosis or edema. Moving extremities with full ROM Abdomen:  Soft, nontender, nondistended. Neuro:  GCS 15, moving all four extremities, interacting appropriately. Speech clear. Psych:  Calm, appropriate.   Skin:  Warm, dry, no rash.    ED Results / Procedures / Treatments   Labs (all labs ordered are listed, but only abnormal results are displayed) Labs Reviewed - No data to display   EKG    RADIOLOGY    PROCEDURES:  Critical Care performed: No  Procedures   MEDICATIONS ORDERED IN ED: Medications  ketorolac (TORADOL) 15 MG/ML injection 15 mg (15 mg Intravenous Given 07/10/23 1142)  sodium chloride 0.9 % bolus 1,000 mL (1,000 mLs Intravenous New Bag/Given 07/10/23 1141)  dexamethasone (DECADRON) injection 10 mg (10 mg Intravenous Given 07/10/23 1142)  ondansetron (ZOFRAN) injection 4 mg (4 mg Intravenous Given 07/10/23 1142)     IMPRESSION / MDM / ASSESSMENT AND PLAN / ED COURSE  I reviewed the triage vital signs and the nursing notes.  Differential diagnosis includes, but is not limited to, chronic migraine, tension headache, dehydration, less likely intracranial hemorrhage.  Patient's presentation is most consistent with exacerbation of chronic illness.  Patient is a 67 year old female presenting today with chronic migraine symptoms which she has had multiple ED visits for in the past.  No red flag symptoms and consistent with her normal migraines.  Patient given fluids, Decadron, Toradol, and Zofran with symptomatic improvement.  Patient was safe for discharge and outpatient follow-up with her neurologist.     FINAL CLINICAL IMPRESSION(S) / ED DIAGNOSES   Final diagnoses:  Migraine without aura and without status migrainosus, not intractable     Rx / DC Orders   ED Discharge Orders     None        Note:  This document was prepared using Dragon  voice recognition software and may include unintentional dictation errors.   Janith Lima, MD 07/10/23 1147    Janith Lima, MD 07/10/23 [phone]

## 2023-07-10 NOTE — Discharge Instructions (Addendum)
You were seen in the emergency department today for your chronic migraine symptoms.  Please follow-up with your neurologist as planned.  Please return to the emergency department if symptoms worsen.

## 2023-07-10 NOTE — ED Triage Notes (Signed)
Pt presents to ED with c/o of migraine for 3 days, pt states HX of same and is here for "the headache cocktail". NAD noted.

## 2023-07-12 ENCOUNTER — Other Ambulatory Visit (HOSPITAL_COMMUNITY): Payer: Self-pay

## 2023-07-12 ENCOUNTER — Ambulatory Visit: Payer: Medicare HMO | Admitting: Hematology and Oncology

## 2023-07-12 NOTE — Telephone Encounter (Signed)
Pharmacy Patient Advocate Encounter  Received notification from [facility] that Prior Authorization for Emgality has been APPROVED from 07/09/2023 to 11/23/2023. Ran test claim, Copay is $95.00. This test claim was processed through [facility]- copay amounts may vary at other pharmacies due to pharmacy/plan contracts, or as the patient moves through the different stages of their insurance plan.

## 2023-07-16 ENCOUNTER — Telehealth: Payer: Self-pay

## 2023-07-16 NOTE — Telephone Encounter (Signed)
Transition Care Management Unsuccessful Follow-up Telephone Call  Date of discharge and from where:  07/10/2023 [facility]  Attempts:  2nd Attempt  Reason for unsuccessful TCM follow-up call:  Left voice message  Maleeah Crossman [facility]  [facility] Population Health Community Resource Care Guide   [email or web address]  ?? [phone]   Website: [email or web address]  [email or web address]

## 2023-07-16 NOTE — Telephone Encounter (Signed)
Transition Care Management Unsuccessful Follow-up Telephone Call  Date of discharge and from where:  07/10/2023 [facility]  Attempts:  1st Attempt  Reason for unsuccessful TCM follow-up call:  Left voice message  Jadaya Sommerfield [facility]  [facility] Population Health Community Resource Care Guide   [email or web address]  ?? [phone]   Website: [email or web address]  [email or web address]

## 2023-07-19 DIAGNOSIS — G518 Other disorders of facial nerve: Secondary | ICD-10-CM | POA: Diagnosis not present

## 2023-07-19 DIAGNOSIS — G43019 Migraine without aura, intractable, without status migrainosus: Secondary | ICD-10-CM | POA: Diagnosis not present

## 2023-07-19 DIAGNOSIS — M542 Cervicalgia: Secondary | ICD-10-CM | POA: Diagnosis not present

## 2023-07-19 DIAGNOSIS — G43719 Chronic migraine without aura, intractable, without status migrainosus: Secondary | ICD-10-CM | POA: Diagnosis not present

## 2023-07-19 DIAGNOSIS — M791 Myalgia, unspecified site: Secondary | ICD-10-CM | POA: Diagnosis not present

## 2023-07-28 ENCOUNTER — Encounter: Payer: Self-pay | Admitting: Family Medicine

## 2023-07-28 ENCOUNTER — Ambulatory Visit (INDEPENDENT_AMBULATORY_CARE_PROVIDER_SITE_OTHER): Payer: Medicare HMO | Admitting: Family Medicine

## 2023-07-28 VITALS — BP 122/68 | HR 69 | Temp 97.6°F | Ht 59.0 in | Wt 127.5 lb

## 2023-07-28 DIAGNOSIS — R300 Dysuria: Secondary | ICD-10-CM

## 2023-07-28 DIAGNOSIS — N3000 Acute cystitis without hematuria: Secondary | ICD-10-CM | POA: Diagnosis not present

## 2023-07-28 LAB — POC URINALSYSI DIPSTICK (AUTOMATED)
Bilirubin, UA: NEGATIVE
Blood, UA: 200 — AB
Glucose, UA: NEGATIVE
Ketones, UA: NEGATIVE
Nitrite, UA: NEGATIVE
Protein, UA: POSITIVE — AB
Spec Grav, UA: 1.015 (ref 1.010–1.025)
Urobilinogen, UA: 0.2 U/dL
pH, UA: 6 (ref 5.0–8.0)

## 2023-07-28 MED ORDER — CEPHALEXIN 500 MG PO CAPS: 500.0000 mg | ORAL_CAPSULE | Freq: Two times a day (BID) | ORAL | 0 refills | Status: DC

## 2023-07-28 NOTE — Assessment & Plan Note (Addendum)
Voiding symptoms  Urinalysis positive for rbc/ wbc and protein  Culture pending  Keflex prescription - will start now pending above Instructed to call if worse in meantime  Handout given   Update if not starting to improve in a week or if worsening  Call back and Er precautions noted in detail today    Addendum- culture is resistant to above Multiple med intol   Sent fosfomycin to pharmacy  Update if not starting to improve in a week or if worsening

## 2023-07-28 NOTE — Progress Notes (Signed)
Subjective:    Patient ID: Jennifer [name], female    DOB: [birth year], 67 y.o.   MRN: [id number]  HPI  Wt Readings from Last 3 Encounters:  07/28/23 127 lb 8 oz (57.8 kg)  07/06/23 127 lb (57.6 kg)  06/30/23 127 lb (57.6 kg)   25.75 kg/m  Vitals:   07/28/23 1225  BP: 122/68  Pulse: 69  Temp: 97.6 F (36.4 C)  SpO2: 98%    Pt presents for urinary symptoms  Started Monday - burning to urinate  Some low back pain   Then frequency- up all night urinating  Yesterday a little improved in day/ then worse at night   Some bloating Achy  No blood in urine  No flank pain    Little nausea No vomiting  Had migraine yesterday- ? Dehydrated     Results for orders placed or performed in visit on 07/28/23  POCT Urinalysis Dipstick (Automated)  Result Value Ref Range   Color, UA Yellow    Clarity, UA Cloudy    Glucose, UA Negative Negative   Bilirubin, UA Negative    Ketones, UA Negative    Spec Grav, UA 1.015 1.010 - 1.025   Blood, UA 200 Ery/uL (A)    pH, UA 6.0 5.0 - 8.0   Protein, UA Positive (A) Negative   Urobilinogen, UA 0.2 0.2 or 1.0 E.U./dL   Nitrite, UA Negative    Leukocytes, UA Large (3+) (A) Negative       Patient Active Problem List   Diagnosis Date Noted   Acute cystitis 07/28/2023   Right hand pain 01/21/2023   Right ankle pain 01/21/2023   Thoracic back pain 01/21/2023   Pain in right shoulder 08/12/2022   Hordeolum externum (stye) 08/04/2022   Raynaud's disease 03/17/2022   Left facial swelling 01/25/2022   Left ear pain 01/23/2022   Right leg pain 10/15/2021   Microcytic anemia 07/07/2021   De Quervain's tenosynovitis, right 04/18/2021   Iron deficiency anemia 09/08/2020   Common bile duct dilatation 09/06/2020   Opacity of lung on imaging study 09/06/2020   Dizziness 07/10/2020   History of breast cancer 03/01/2020   Genetic testing 11/14/2019   Bilateral hand pain 11/03/2019   Family history of prostate cancer    Adnexal cyst  09/26/2019   Family history of breast cancer    Malignant neoplasm of upper-inner quadrant of right breast in female, estrogen receptor positive (HCC) 09/20/2018   B12 deficiency 04/13/2018   Joint pain 04/11/2018   Colon cancer screening 09/16/2017   Prediabetes 07/28/2016   Allergic rhinitis 11/13/2014   Screening for lipoid disorders 09/26/2014   Preventative health care 09/26/2014   Dysuria 08/07/2014   Routine general medical examination at a health care facility 10/28/2012   GERD 09/16/2010   Hypothyroidism 05/07/2008   Osteopenia 05/07/2008   Migraine with aura 11/11/2007   Past Medical History:  Diagnosis Date   Allergic rhinitis    Allergy    seasonal   Anemia    Anxiety    Arthritis    hands, knees   Asthma    "as a child"   Blood transfusion without reported diagnosis    "as a child"   Breast cancer (HCC) 2019   Right Breast Cancer   Cancer (HCC) 09/2018   Breast CA new DX, right breast   Cervical dysplasia    Common bile duct dilation    Diabetes mellitus without complication (HCC)    Endometriosis  Esophageal reflux    Family history of adverse reaction to anesthesia    younger sister anxiety for a dew days after   Family history of breast cancer    Family history of breast cancer    Family history of prostate cancer    Hepatic cyst 08/2020   multiple   Hepatic hemangioma 08/2020   intrahepatic hemangioma   Hiatal hernia    HSV-1 (herpes simplex virus 1) infection    Hypertension    pt.denies 10/19/22   Hypothyroid    Migraine with aura, without mention of intractable migraine without mention of status migrainosus    Osteopenia 08/2019   T score -2.2 FRAX 11% / 1.7%   Personal history of radiation therapy 2019   Right Breast Cancer   Pre-diabetes    Raynaud's disease    Past Surgical History:  Procedure Laterality Date   BIOPSY  09/12/2020   Procedure: BIOPSY;  Surgeon: Rachael Fee, MD;  Location: [facility];  Service: Endoscopy;;    BREAST BIOPSY Left 11/27/2022   MM LT BREAST BX W LOC DEV 1ST LESION IMAGE BX SPEC STEREO GUIDE 11/27/2022 [facility]   BREAST EXCISIONAL BIOPSY Bilateral over 10 years ago   benign x 2    BREAST LUMPECTOMY Right 10/10/2018   BREAST LUMPECTOMY WITH RADIOACTIVE SEED AND SENTINEL LYMPH NODE BIOPSY Right 10/10/2018   Procedure: RIGHT BREAST LUMPECTOMY WITH RADIOACTIVE SEED AND RIGHT SENTINEL LYMPH NODE BIOPSY;  Surgeon: Chevis Pretty III, MD;  Location: [facility];  Service: General;  Laterality: Right;   BREAST SURGERY     Benign breast lump excised   CERVICAL CONE BIOPSY  1985   severe dysplasia   COLONOSCOPY  2005 and dec 2019   normal   COLPOSCOPY     endoscopy  10/2018   ESOPHAGOGASTRODUODENOSCOPY (EGD) WITH PROPOFOL N/A 09/12/2020   Procedure: ESOPHAGOGASTRODUODENOSCOPY (EGD) WITH PROPOFOL;  Surgeon: Rachael Fee, MD;  Location: [facility];  Service: Endoscopy;  Laterality: N/A;   EUS N/A 09/12/2020   Procedure: UPPER ENDOSCOPIC ULTRASOUND (EUS) RADIAL;  Surgeon: Rachael Fee, MD;  Location: [facility];  Service: Endoscopy;  Laterality: N/A;   LAPAROSCOPIC ASSISTED VAGINAL HYSTERECTOMY     LAPAROSCOPIC BILATERAL SALPINGO OOPHERECTOMY Bilateral 10/27/2019   Procedure: LAPAROSCOPIC BILATERAL SALPINGO OOPHORECTOMY WITH PERITONEAL WASHINGS;  Surgeon: Genia Del, MD;  Location: [facility] ;  Service: Gynecology;  Laterality: Bilateral;  request 1:00pm on Friday, Dec. 4th in [place] Iqueue time held requests one hour OR time   moles removed from upper back, face lft, 1 between breasts, upper leg left inside  10/18/2019   wearing small round bandaids on for 2 weeks   ROTATOR CUFF REPAIR Bilateral 08/2008   UPPER GASTROINTESTINAL ENDOSCOPY     Social History   Tobacco Use   Smoking status: Never    Passive exposure: Never   Smokeless tobacco: Never  Vaping Use   Vaping status: Never Used  Substance Use Topics   Alcohol  use: No    Alcohol/week: 0.0 standard drinks of alcohol   Drug use: No   Family History  Problem Relation Age of Onset   Breast cancer Mother 83   Diabetes Mother    Irritable bowel syndrome Mother    Hypertension Father    Heart disease Father    Hypertension Sister    Anuerysm Sister        head   Irritable bowel syndrome Sister    Hypertension Sister  Anxiety disorder Sister    Other Sister        prediabetes   Celiac disease Brother    Osteoporosis Maternal Grandmother    Diabetes Maternal Grandmother    COPD Paternal Grandmother    Heart disease Paternal Grandfather    Breast cancer Cousin 72       pat first cousin   Breast cancer Cousin        mat second cousin with breast cancer in her 30s   Thyroid cancer Cousin 17       pat first cousin   Prostate cancer Other 81   Colon cancer Neg Hx    Stomach cancer Neg Hx    Esophageal cancer Neg Hx    Pancreatic cancer Neg Hx    Rectal cancer Neg Hx    Colon polyps Neg Hx    Crohn's disease Neg Hx    Ulcerative colitis Neg Hx    Allergies  Allergen Reactions   Ciprofloxacin Other (See Comments)    Dizziness    Codeine Phosphate Other (See Comments)    Dizziness, heart palpitations, nervous   Decongestant [Pseudoephedrine] Other (See Comments)    dizziness   Diclofenac    Flonase [Fluticasone Propionate] Other (See Comments)    Worsens her migraine    Hydrocodone Other (See Comments)    Made nervous--10/09 surgery rotator cuff   Macrobid [Nitrofurantoin]     headaches   Nasonex [Mometasone]     Migraines   Reglan [Metoclopramide] Other (See Comments)    Dizziness   Topamax [Topiramate] Other (See Comments)    Dizziness    Benadryl [Diphenhydramine Hcl] Palpitations   Sulfa Antibiotics Anxiety   Current Outpatient Medications on File Prior to Visit  Medication Sig Dispense Refill   ALPRAZolam (XANAX) 0.5 MG tablet TAKE 1/2 TO 1 TABLET BY MOUTH ONCE DAILY AS NEEDED FOR SLEEP/ANXIETY 15 tablet 2    atenolol (TENORMIN) 25 MG tablet Take 12.5 mg by mouth 3 (three) times daily. For migraine prevention     B Complex Vitamins (B COMPLEX PO) Take by mouth.     baclofen (LIORESAL) 10 MG tablet Take by mouth as needed.     CALCIUM CARBONATE-VITAMIN D PO Take 1 tablet by mouth daily. 1000 mg     famotidine (PEPCID) 40 MG tablet Take 40 mg by mouth 2 (two) times daily.      Galcanezumab-gnlm (EMGALITY) 120 MG/ML SOAJ Inject 120 mg into the skin every 30 (thirty) days. 1.12 mL 11   hydrocortisone 2.5 % cream Apply to affected areas BID PRN as directed. 30 g 3   hydrOXYzine (ATARAX) 25 MG tablet Take 37.5 mg by mouth at bedtime as needed.     letrozole (FEMARA) 2.5 MG tablet TAKE 1 TABLET BY MOUTH EVERY DAY 90 tablet 3   levocetirizine (XYZAL) 5 MG tablet Take 5 mg by mouth at bedtime.   3   levothyroxine (SYNTHROID) 75 MCG tablet TAKE 1 TABLET BY MOUTH DAILY BEFORE BREAKFAST 90 tablet 1   Magnesium Oxide 400 (240 Mg) MG TABS Take 400 mg by mouth daily.      metFORMIN (GLUCOPHAGE-XR) 500 MG 24 hr tablet TAKE 1 TABLET BY MOUTH EVERY DAY WITH BREAKFAST 90 tablet 1   methocarbamol (ROBAXIN) 500 MG tablet Take 500 mg by mouth 2 (two) times daily.     Multiple Vitamin (MULTIVITAMIN WITH MINERALS) TABS tablet Take 1 tablet by mouth daily.     nabumetone (RELAFEN) 500 MG tablet Take 500 mg  by mouth 2 (two) times daily.     neomycin-polymyxin b-dexamethasone (MAXITROL) 3.5-10000-0.1 SUSP 1 drop 3 (three) times daily.     Omega-3 Fatty Acids (FISH OIL) 1000 MG CAPS Take 1,000 mg by mouth daily.      OnabotulinumtoxinA (BOTOX IJ) Inject 1 Dose as directed every 6 (six) weeks.      ondansetron (ZOFRAN ODT) 4 MG disintegrating tablet Take 1 tablet (4 mg total) by mouth every 8 (eight) hours as needed. 20 tablet 0   pantoprazole (PROTONIX) 20 MG tablet TAKE 1 TABLET BY MOUTH EVERY DAY 90 tablet 1   predniSONE (DELTASONE) 20 MG tablet Take 4 pills once daily by mouth for 3 days, then 3 pills daily for 3 days, then  2 pills daily for 3 days then 1 pill daily for 3 days then stop 30 tablet 0   PRESCRIPTION MEDICATION Inject into the skin every 6 (six) weeks. Steroid Trigger     Probiotic Product (PROBIOTIC-10 PO) Take 1 capsule by mouth daily.      promethazine (PHENERGAN) 25 MG tablet Take 25 mg by mouth daily as needed.     Rimegepant Sulfate (NURTEC) 75 MG TBDP Every other day 16 tablet 0   rizatriptan (MAXALT) 10 MG tablet Take 1 tablet (10 mg total) by mouth once as needed for up to 10 days for migraine. May repeat in 2 hours if needed 10 tablet 0   triamcinolone (KENALOG) 0.1 % Apply to affected areas BID PRN for up to two weeks at a time as directed. 45 g 3   TURMERIC PO Take 1 tablet by mouth daily.     Current Facility-Administered Medications on File Prior to Visit  Medication Dose Route Frequency Provider Last Rate Last Admin   Galcanezumab-gnlm SOAJ 240 mg  240 mg Subcutaneous Once         Review of Systems  Constitutional:  Positive for fatigue. Negative for activity change, appetite change and fever.  HENT:  Negative for congestion and sore throat.   Eyes:  Negative for itching and visual disturbance.  Respiratory:  Negative for cough and shortness of breath.   Cardiovascular:  Negative for leg swelling.  Gastrointestinal:  Positive for nausea. Negative for abdominal distention, abdominal pain, constipation and diarrhea.  Endocrine: Negative for cold intolerance and polydipsia.  Genitourinary:  Positive for dysuria, frequency and urgency. Negative for difficulty urinating, flank pain and hematuria.  Musculoskeletal:  Negative for myalgias.  Skin:  Negative for rash.  Allergic/Immunologic: Negative for immunocompromised state.  Neurological:  Negative for dizziness and weakness.  Hematological:  Negative for adenopathy.       Objective:   Physical Exam Constitutional:      General: She is not in acute distress.    Appearance: Normal appearance. She is well-developed and normal  weight. She is not ill-appearing or diaphoretic.  HENT:     Head: Normocephalic and atraumatic.  Eyes:     Conjunctiva/sclera: Conjunctivae normal.     Pupils: Pupils are equal, round, and reactive to light.  Cardiovascular:     Rate and Rhythm: Normal rate and regular rhythm.     Heart sounds: Normal heart sounds.  Pulmonary:     Effort: Pulmonary effort is normal.     Breath sounds: Normal breath sounds.  Abdominal:     General: Bowel sounds are normal. There is no distension.     Palpations: Abdomen is soft.     Tenderness: There is abdominal tenderness. There is no rebound.  Comments: No cva tenderness Some low back tenderness bilat   Mild suprapubic tenderness to deep palp Bladder does not feel distended   Musculoskeletal:     Cervical back: Normal range of motion and neck supple.  Lymphadenopathy:     Cervical: No cervical adenopathy.  Skin:    Findings: No rash.  Neurological:     Mental Status: She is alert.           Assessment & Plan:   Problem List Items Addressed This Visit       Genitourinary   Acute cystitis - Primary    Voiding symptoms  Urinalysis positive for rbc/ wbc and protein  Culture pending  Keflex prescription - will start now pending above Instructed to call if worse in meantime  Handout given   Update if not starting to improve in a week or if worsening  Call back and Er precautions noted in detail today         Relevant Orders   Urine Culture     Other   Dysuria   Relevant Orders   POCT Urinalysis Dipstick (Automated) (Completed)

## 2023-07-28 NOTE — Patient Instructions (Addendum)
Drink lots of fluids  Take the keflex as directed   If your symptoms suddenly worsen or you run a fever let us know   We will contact you with results of the urine culture and plan

## 2023-07-29 DIAGNOSIS — G43009 Migraine without aura, not intractable, without status migrainosus: Secondary | ICD-10-CM | POA: Insufficient documentation

## 2023-07-29 DIAGNOSIS — G43711 Chronic migraine without aura, intractable, with status migrainosus: Secondary | ICD-10-CM | POA: Insufficient documentation

## 2023-07-29 MED ORDER — NURTEC 75 MG PO TBDP: 75.0000 mg | ORAL_TABLET | Freq: Every day | ORAL | 11 refills | Status: DC | PRN

## 2023-07-29 NOTE — Addendum Note (Signed)
Addended by: Naomie Dean B on: 07/29/2023 05:10 PM   Modules accepted: Orders

## 2023-07-30 ENCOUNTER — Other Ambulatory Visit: Payer: Self-pay | Admitting: Family Medicine

## 2023-07-30 LAB — URINE CULTURE
MICRO NUMBER:: 15420128
SPECIMEN QUALITY:: ADEQUATE

## 2023-07-30 MED ORDER — FOSFOMYCIN TROMETHAMINE 3 G PO PACK: 3.0000 g | PACK | Freq: Once | ORAL | 0 refills | Status: AC

## 2023-07-30 NOTE — Addendum Note (Signed)
Addended by: Roxy Manns A on: 07/30/2023 04:50 PM   Modules accepted: Orders

## 2023-08-02 ENCOUNTER — Ambulatory Visit: Payer: Medicare HMO | Admitting: Dermatology

## 2023-08-02 ENCOUNTER — Encounter: Payer: Self-pay | Admitting: Dermatology

## 2023-08-02 DIAGNOSIS — W57XXXA Bitten or stung by nonvenomous insect and other nonvenomous arthropods, initial encounter: Secondary | ICD-10-CM

## 2023-08-02 DIAGNOSIS — S80861A Insect bite (nonvenomous), right lower leg, initial encounter: Secondary | ICD-10-CM | POA: Diagnosis not present

## 2023-08-02 DIAGNOSIS — S70361A Insect bite (nonvenomous), right thigh, initial encounter: Secondary | ICD-10-CM

## 2023-08-02 DIAGNOSIS — S80862A Insect bite (nonvenomous), left lower leg, initial encounter: Secondary | ICD-10-CM | POA: Diagnosis not present

## 2023-08-02 DIAGNOSIS — I788 Other diseases of capillaries: Secondary | ICD-10-CM | POA: Diagnosis not present

## 2023-08-02 MED ORDER — TRIAMCINOLONE ACETONIDE 0.1 % EX CREA: TOPICAL_CREAM | CUTANEOUS | 1 refills | Status: AC

## 2023-08-02 NOTE — Telephone Encounter (Signed)
That leaves [place] with sulfa and macrobid  I know she has intolerance of both- (neither was an allergy)  Which one does she want to try (which one does she tolerate better)?

## 2023-08-02 NOTE — Patient Instructions (Signed)

## 2023-08-02 NOTE — Telephone Encounter (Signed)
See message regarding this med not being covered

## 2023-08-02 NOTE — Progress Notes (Signed)
   Follow-Up Visit   Subjective  Jennifer [name] is a 67 y.o. female who presents for the following: Patient c/o itchy bug bites on her legs that started 3 days ago after she had been out in her yard, using otc Calmoseptine cream and Hydrocortisone, Triamcinolone cream with a fair response,  last night she noticed a large red patch on her right leg, no symptoms,  The patient has spots, moles and lesions to be evaluated, some may be new or changing and the patient may have concern these could be cancer.   The following portions of the chart were reviewed this encounter and updated as appropriate: medications, allergies, medical history  Review of Systems:  No other skin or systemic complaints except as noted in HPI or Assessment and Plan.  Objective  Well appearing patient in no apparent distress; mood and affect are within normal limits.  A focused examination was performed of the following areas:bilateral legs    Relevant exam findings are noted in the Assessment and Plan.    Assessment & Plan   Arthropod bites with capillaritis Exam: inflamed blanchable pink papules on bilateral lower legs and right thigh. Tiny cayenne pepper macules coalescing into a  patch on right lower anterior leg    Treatment Plan: Start Triamcinolone cream apply to legs qd-bid until smooth Capillaritis is benign, will self-resolve, can use compression socks and elevation to reduce  Topical steroids (such as triamcinolone, fluocinolone, fluocinonide, mometasone, clobetasol, halobetasol, betamethasone, hydrocortisone) can cause thinning and lightening of the skin if they are used for too long in the same area. Your physician has selected the right strength medicine for your problem and area affected on the body. Please use your medication only as directed by your physician to prevent side effects.     recommend wearing compression   Return if symptoms worsen or fail to improve.  IAngelique Holm, CMA, am  acting as scribe for Jennifer Goody, MD .   Documentation: I have reviewed the above documentation for accuracy and completeness, and I agree with the above.  Jennifer Goody, MD

## 2023-08-02 NOTE — Telephone Encounter (Signed)
Called pt and she did get abx they used a saving card and she was able to get med for $30 without using her insurance.   Pt did have a question/ comment.   1st.      She did want PCP to know she didn't have any major side eff to abx she had slight dizziness for a little while after taking it but after that she felt fine. She just wanted PCP to know she can take that abx since she is allergic to a few abx.  2nd.    Pt asked if she could drop off a urine sample to make sure the UTI has resolved. Pt said twice since taking abx she had a small amount of dysuria after urinating but it was only twice and the pain wasn't sever but she just would feel better if she was able to leave a urine sample to make sure UTI has resolved. If PCP is okay with that she asked how long she should wait before she drops off urine

## 2023-08-02 NOTE — Telephone Encounter (Signed)
Thanks for the heads up Please drop off sample 7-10 days after finishing antibiotic  Keep up a good fluid intake

## 2023-08-03 ENCOUNTER — Encounter: Payer: Self-pay | Admitting: Hematology and Oncology

## 2023-08-03 ENCOUNTER — Other Ambulatory Visit (HOSPITAL_COMMUNITY): Payer: Self-pay

## 2023-08-03 ENCOUNTER — Telehealth: Payer: Self-pay | Admitting: Neurology

## 2023-08-03 NOTE — Telephone Encounter (Signed)
Please submit PA.

## 2023-08-03 NOTE — Telephone Encounter (Signed)
[employer] calling for Prior Authorization for  Rimegepant Sulfate (NURTEC) 75 MG TBDP.    Case no: [id number]

## 2023-08-03 NOTE — Telephone Encounter (Signed)
Pt notified of Dr. Royden Purl comments. Left UA supplies at front for pick up

## 2023-08-04 ENCOUNTER — Other Ambulatory Visit (HOSPITAL_COMMUNITY): Payer: Self-pay

## 2023-08-04 ENCOUNTER — Telehealth: Payer: Self-pay

## 2023-08-04 ENCOUNTER — Encounter: Payer: Self-pay | Admitting: Hematology and Oncology

## 2023-08-04 NOTE — Telephone Encounter (Signed)
   Apparently another PA was started but do not see any documentation in chart so unsure where it was generated from-I tried calling [employer] and was transferred and once at the correct department was asked for my call back number stating they are having a system update at this moment and would call me back once it was finished.

## 2023-08-05 ENCOUNTER — Other Ambulatory Visit: Payer: Self-pay

## 2023-08-05 DIAGNOSIS — C50211 Malignant neoplasm of upper-inner quadrant of right female breast: Secondary | ICD-10-CM

## 2023-08-05 DIAGNOSIS — D509 Iron deficiency anemia, unspecified: Secondary | ICD-10-CM

## 2023-08-06 ENCOUNTER — Other Ambulatory Visit: Payer: Medicare HMO

## 2023-08-06 ENCOUNTER — Telehealth: Payer: Self-pay | Admitting: Family Medicine

## 2023-08-06 ENCOUNTER — Inpatient Hospital Stay: Payer: Medicare HMO | Attending: Hematology and Oncology

## 2023-08-06 DIAGNOSIS — D509 Iron deficiency anemia, unspecified: Secondary | ICD-10-CM | POA: Insufficient documentation

## 2023-08-06 DIAGNOSIS — N3000 Acute cystitis without hematuria: Secondary | ICD-10-CM

## 2023-08-06 DIAGNOSIS — Z923 Personal history of irradiation: Secondary | ICD-10-CM | POA: Insufficient documentation

## 2023-08-06 DIAGNOSIS — Z170 Estrogen receptor positive status [ER+]: Secondary | ICD-10-CM | POA: Diagnosis not present

## 2023-08-06 DIAGNOSIS — Z0189 Encounter for other specified special examinations: Secondary | ICD-10-CM | POA: Diagnosis not present

## 2023-08-06 DIAGNOSIS — C50211 Malignant neoplasm of upper-inner quadrant of right female breast: Secondary | ICD-10-CM | POA: Insufficient documentation

## 2023-08-06 DIAGNOSIS — Z79811 Long term (current) use of aromatase inhibitors: Secondary | ICD-10-CM | POA: Insufficient documentation

## 2023-08-06 DIAGNOSIS — M8580 Other specified disorders of bone density and structure, unspecified site: Secondary | ICD-10-CM | POA: Insufficient documentation

## 2023-08-06 DIAGNOSIS — G43909 Migraine, unspecified, not intractable, without status migrainosus: Secondary | ICD-10-CM | POA: Insufficient documentation

## 2023-08-06 DIAGNOSIS — Z9221 Personal history of antineoplastic chemotherapy: Secondary | ICD-10-CM | POA: Diagnosis not present

## 2023-08-06 LAB — CBC WITH DIFFERENTIAL (CANCER CENTER ONLY)
Abs Immature Granulocytes: 0 10*3/uL (ref 0.00–0.07)
Basophils Absolute: 0 10*3/uL (ref 0.0–0.1)
Basophils Relative: 1 %
Eosinophils Absolute: 0.2 10*3/uL (ref 0.0–0.5)
Eosinophils Relative: 4 %
HCT: 39.7 % (ref 36.0–46.0)
Hemoglobin: 13.4 g/dL (ref 12.0–15.0)
Immature Granulocytes: 0 %
Lymphocytes Relative: 33 %
Lymphs Abs: 1.5 10*3/uL (ref 0.7–4.0)
MCH: 30.7 pg (ref 26.0–34.0)
MCHC: 33.8 g/dL (ref 30.0–36.0)
MCV: 91.1 fL (ref 80.0–100.0)
Monocytes Absolute: 0.5 10*3/uL (ref 0.1–1.0)
Monocytes Relative: 10 %
Neutro Abs: 2.4 10*3/uL (ref 1.7–7.7)
Neutrophils Relative %: 52 %
Platelet Count: 333 10*3/uL (ref 150–400)
RBC: 4.36 MIL/uL (ref 3.87–5.11)
RDW: 11.9 % (ref 11.5–15.5)
WBC Count: 4.6 10*3/uL (ref 4.0–10.5)
nRBC: 0 % (ref 0.0–0.2)

## 2023-08-06 LAB — IRON AND IRON BINDING CAPACITY (CC-WL,HP ONLY)
Iron: 75 ug/dL (ref 28–170)
Saturation Ratios: 16 % (ref 10.4–31.8)
TIBC: 465 ug/dL — ABNORMAL HIGH (ref 250–450)
UIBC: 390 ug/dL (ref 148–442)

## 2023-08-06 LAB — CMP (CANCER CENTER ONLY)
ALT: 19 U/L (ref 0–44)
AST: 18 U/L (ref 15–41)
Albumin: 4.3 g/dL (ref 3.5–5.0)
Alkaline Phosphatase: 101 U/L (ref 38–126)
Anion gap: 6 (ref 5–15)
BUN: 18 mg/dL (ref 8–23)
CO2: 29 mmol/L (ref 22–32)
Calcium: 9.6 mg/dL (ref 8.9–10.3)
Chloride: 102 mmol/L (ref 98–111)
Creatinine: 0.7 mg/dL (ref 0.44–1.00)
GFR, Estimated: >60 mL/min (ref >60)
Glucose, Bld: 92 mg/dL (ref 70–99)
Potassium: 3.9 mmol/L (ref 3.5–5.1)
Sodium: 137 mmol/L (ref 135–145)
Total Bilirubin: 0.4 mg/dL (ref 0.3–1.2)
Total Protein: 7.5 g/dL (ref 6.5–8.1)

## 2023-08-06 LAB — FERRITIN: Ferritin: 35 ng/mL (ref 11–307)

## 2023-08-06 NOTE — Telephone Encounter (Signed)
Lab orders only

## 2023-08-06 NOTE — Telephone Encounter (Signed)
Urinalysis and culture order Pt had uti treatment with fosfomycin  Was esbl e coli

## 2023-08-07 LAB — URINALYSIS, ROUTINE W REFLEX MICROSCOPIC
Bacteria, UA: NONE SEEN /HPF
Bilirubin Urine: NEGATIVE
Glucose, UA: NEGATIVE
Hgb urine dipstick: NEGATIVE
Hyaline Cast: NONE SEEN /LPF
Ketones, ur: NEGATIVE
Nitrite: NEGATIVE
Protein, ur: NEGATIVE
Specific Gravity, Urine: 1.015 (ref 1.001–1.035)
Squamous Epithelial / HPF: NONE SEEN /HPF (ref <=5)
pH: 7.5 (ref 5.0–8.0)

## 2023-08-07 LAB — URINE CULTURE
MICRO NUMBER:: 15464076
Result:: NO GROWTH
SPECIMEN QUALITY:: ADEQUATE

## 2023-08-07 LAB — MICROSCOPIC MESSAGE

## 2023-08-10 ENCOUNTER — Inpatient Hospital Stay: Payer: Medicare HMO | Admitting: Hematology and Oncology

## 2023-08-10 VITALS — BP 132/63 | HR 66 | Temp 97.9°F | Resp 18 | Ht 59.0 in | Wt 128.9 lb

## 2023-08-10 DIAGNOSIS — C50211 Malignant neoplasm of upper-inner quadrant of right female breast: Secondary | ICD-10-CM

## 2023-08-10 DIAGNOSIS — D509 Iron deficiency anemia, unspecified: Secondary | ICD-10-CM

## 2023-08-10 DIAGNOSIS — G43909 Migraine, unspecified, not intractable, without status migrainosus: Secondary | ICD-10-CM | POA: Diagnosis not present

## 2023-08-10 DIAGNOSIS — Z923 Personal history of irradiation: Secondary | ICD-10-CM | POA: Diagnosis not present

## 2023-08-10 DIAGNOSIS — Z9221 Personal history of antineoplastic chemotherapy: Secondary | ICD-10-CM | POA: Diagnosis not present

## 2023-08-10 DIAGNOSIS — Z170 Estrogen receptor positive status [ER+]: Secondary | ICD-10-CM | POA: Diagnosis not present

## 2023-08-10 DIAGNOSIS — Z79811 Long term (current) use of aromatase inhibitors: Secondary | ICD-10-CM | POA: Diagnosis not present

## 2023-08-10 DIAGNOSIS — M8580 Other specified disorders of bone density and structure, unspecified site: Secondary | ICD-10-CM | POA: Diagnosis not present

## 2023-08-10 MED ORDER — LETROZOLE 2.5 MG PO TABS: 2.5000 mg | ORAL_TABLET | Freq: Every day | ORAL | 3 refills | Status: DC

## 2023-08-10 NOTE — Progress Notes (Signed)
Patient Care Team: Tower, Audrie Gallus, MD as PCP - General (Family Medicine) Serena Croissant, MD as Consulting Physician (Hematology and Oncology) Dorothy Puffer, MD as Consulting Physician (Radiation Oncology) Griselda Miner, MD as Consulting Physician (General Surgery)  DIAGNOSIS:  Encounter Diagnoses  Name Primary?   Malignant neoplasm of upper-inner quadrant of right breast in female, estrogen receptor positive (HCC) Yes   Iron deficiency anemia, unspecified iron deficiency anemia type     SUMMARY OF ONCOLOGIC HISTORY: Oncology History  Malignant neoplasm of upper-inner quadrant of right breast in female, estrogen receptor positive (HCC)  09/13/2018 Initial Diagnosis   Diagnostic mammogram detected right breast mass with distortion LIQ at 2:00 9 cm from nipple 1.7 cm, at 1:00 there was a benign 1.2 cm fibrocystic change, biopsy of the 2:00 mass revealed grade 1 IDC with DCIS ER 100%, PR 90%, Ki-67 10%, HER-2 1+ negative, T1CN0 stage Ia clinical stage   10/10/2018 Surgery   Right lumpectomy: IDC grade 1, 1.2 cm, with intermediate grade DCIS, margins negative, 0/2 lymph nodes negative, T1CN0 stage Ia ER 100%, PR 90%, Ki-67 10%, HER-2 1+ negative    10/10/2018 Oncotype testing   oncotype results of 29: Risk of recurrence without chemo 18%   11/07/2018 - 12/08/2018 Radiation Therapy   1. Right Breast / 42.56 Gy in 16 fractions 2. Right Breast Boost / 8 Gy in 4 fractions - Total dose 50.56 Gy   12/2018 -  Anti-estrogen oral therapy   Anastrozole daily, planned for 7 years   11/11/2019 Genetic Testing   Negative genetic testing on the common hereditary cancer panel.  The Common Hereditary Gene Panel offered by [facility] includes sequencing and/or deletion duplication testing of the following 48 genes: APC, ATM, AXIN2, BARD1, BMPR1A, BRCA1, BRCA2, BRIP1, CDH1, CDK4, CDKN2A (p14ARF), CDKN2A (p16INK4a), CHEK2, CTNNA1, DICER1, EPCAM (Deletion/duplication testing only), GREM1 (promoter region  deletion/duplication testing only), KIT, MEN1, MLH1, MSH2, MSH3, MSH6, MUTYH, NBN, NF1, NHTL1, PALB2, PDGFRA, PMS2, POLD1, POLE, PTEN, RAD50, RAD51C, RAD51D, RNF43, SDHB, SDHC, SDHD, SMAD4, SMARCA4. STK11, TP53, TSC1, TSC2, and VHL.  The following genes were evaluated for sequence changes only: SDHA and HOXB13 c.251G>A variant only. The report date is 11/11/2019     CHIEF COMPLIANT:   Discussed the use of AI scribe software for clinical note transcription with the patient, who gave verbal consent to proceed.  History of Present Illness   The patient, with a history of breast cancer, migraines, and osteopenia, presents for a routine follow-up. She reports ongoing issues with migraines, for which she has recently started Emgality and Nurtec under the care of a new neurologist. She has noticed some improvement with these new medications.  The patient is currently in her fifth year post-breast cancer diagnosis and is on letrozole for hormone therapy. She reports no significant side effects from the medication, except for occasional hot flashes and potential hair thinning. She expresses anxiety about the potential recurrence of cancer and is interested in continuing the letrozole as a "safety net."  The patient also expresses concern about her osteopenia, which has been worsening over the past two years. She is due for a bone density scan and is worried about the potential progression to osteoporosis. She has a history of taking Fosamax for this condition and is open to considering injectable bisphosphonates.         ALLERGIES:  is allergic to ciprofloxacin, codeine phosphate, decongestant [pseudoephedrine], diclofenac, flonase [fluticasone propionate], hydrocodone, macrobid [nitrofurantoin], nasonex [mometasone], reglan [metoclopramide], topamax [topiramate], benadryl [diphenhydramine  hcl], and sulfa antibiotics.  MEDICATIONS:  Current Outpatient Medications  Medication Sig Dispense Refill    ALPRAZolam (XANAX) 0.5 MG tablet TAKE 1/2 TO 1 TABLET BY MOUTH ONCE DAILY AS NEEDED FOR SLEEP/ANXIETY 15 tablet 2   atenolol (TENORMIN) 25 MG tablet Take 12.5 mg by mouth 3 (three) times daily. For migraine prevention     B Complex Vitamins (B COMPLEX PO) Take by mouth.     baclofen (LIORESAL) 10 MG tablet Take by mouth as needed.     CALCIUM CARBONATE-VITAMIN D PO Take 1 tablet by mouth daily. 1000 mg     famotidine (PEPCID) 40 MG tablet Take 40 mg by mouth 2 (two) times daily.      Galcanezumab-gnlm (EMGALITY) 120 MG/ML SOAJ Inject 120 mg into the skin every 30 (thirty) days. 1.12 mL 11   hydrocortisone 2.5 % cream Apply to affected areas BID PRN as directed. 30 g 3   hydrOXYzine (ATARAX) 25 MG tablet Take 37.5 mg by mouth at bedtime as needed.     levocetirizine (XYZAL) 5 MG tablet Take 5 mg by mouth at bedtime.   3   levothyroxine (SYNTHROID) 75 MCG tablet TAKE 1 TABLET BY MOUTH DAILY BEFORE BREAKFAST 90 tablet 1   Magnesium Oxide 400 (240 Mg) MG TABS Take 400 mg by mouth daily.      metFORMIN (GLUCOPHAGE-XR) 500 MG 24 hr tablet TAKE 1 TABLET BY MOUTH EVERY DAY WITH BREAKFAST 90 tablet 1   Multiple Vitamin (MULTIVITAMIN WITH MINERALS) TABS tablet Take 1 tablet by mouth daily.     nabumetone (RELAFEN) 500 MG tablet Take 500 mg by mouth 2 (two) times daily.     neomycin-polymyxin b-dexamethasone (MAXITROL) 3.5-10000-0.1 SUSP 1 drop 3 (three) times daily.     Omega-3 Fatty Acids (FISH OIL) 1000 MG CAPS Take 1,000 mg by mouth daily.      OnabotulinumtoxinA (BOTOX IJ) Inject 1 Dose as directed every 6 (six) weeks.      ondansetron (ZOFRAN ODT) 4 MG disintegrating tablet Take 1 tablet (4 mg total) by mouth every 8 (eight) hours as needed. 20 tablet 0   pantoprazole (PROTONIX) 20 MG tablet TAKE 1 TABLET BY MOUTH EVERY DAY 90 tablet 1   predniSONE (DELTASONE) 20 MG tablet Take 4 pills once daily by mouth for 3 days, then 3 pills daily for 3 days, then 2 pills daily for 3 days then 1 pill daily for 3  days then stop 30 tablet 0   PRESCRIPTION MEDICATION Inject into the skin every 6 (six) weeks. Steroid Trigger     Probiotic Product (PROBIOTIC-10 PO) Take 1 capsule by mouth daily.      promethazine (PHENERGAN) 25 MG tablet Take 25 mg by mouth daily as needed.     rizatriptan (MAXALT) 10 MG tablet Take 1 tablet (10 mg total) by mouth once as needed for up to 10 days for migraine. May repeat in 2 hours if needed 10 tablet 0   triamcinolone cream (KENALOG) 0.1 % Apply to affected areas BID PRN for up to two weeks at a time as directed. 30 g 1   TURMERIC PO Take 1 tablet by mouth daily.     letrozole (FEMARA) 2.5 MG tablet Take 1 tablet (2.5 mg total) by mouth daily. 90 tablet 3   Rimegepant Sulfate (NURTEC) 75 MG TBDP Take 1 tablet (75 mg total) by mouth daily as needed. For migraines. Take as close to onset of migraine as possible. One daily maximum. (Patient not taking:  Reported on 08/10/2023) 16 tablet 11   Current Facility-Administered Medications  Medication Dose Route Frequency Provider Last Rate Last Admin   Galcanezumab-gnlm SOAJ 240 mg  240 mg Subcutaneous Once         PHYSICAL EXAMINATION: ECOG PERFORMANCE STATUS: 1 - Symptomatic but completely ambulatory  Vitals:   08/10/23 1144  BP: 132/63  Pulse: 66  Resp: 18  Temp: 97.9 F (36.6 C)  SpO2: 100%   Filed Weights   08/10/23 1144  Weight: 128 lb 14.4 oz (58.5 kg)    LABORATORY DATA:  I have reviewed the data as listed    Latest Ref Rng & Units 08/06/2023   12:10 PM 10/12/2022   12:39 AM 08/07/2022   10:04 AM  CMP  Glucose 70 - 99 mg/dL 92  409  811   BUN 8 - 23 mg/dL 18  17  22    Creatinine 0.44 - 1.00 mg/dL 9.14  7.82  9.56   Sodium 135 - 145 mmol/L 137  132  138   Potassium 3.5 - 5.1 mmol/L 3.9  3.3  3.7   Chloride 98 - 111 mmol/L 102  94  103   CO2 22 - 32 mmol/L 29  26  27    Calcium 8.9 - 10.3 mg/dL 9.6  9.1  9.6   Total Protein 6.5 - 8.1 g/dL 7.5     Total Bilirubin 0.3 - 1.2 mg/dL 0.4     Alkaline Phos 38  - 126 U/L 101     AST 15 - 41 U/L 18     ALT 0 - 44 U/L 19       Lab Results  Component Value Date   WBC 4.6 08/06/2023   HGB 13.4 08/06/2023   HCT 39.7 08/06/2023   MCV 91.1 08/06/2023   PLT 333 08/06/2023   NEUTROABS 2.4 08/06/2023    ASSESSMENT & PLAN:  Malignant neoplasm of upper-inner quadrant of right breast in female, estrogen receptor positive (HCC) 10/10/2018: Right lumpectomy: IDC grade 1, 1.2 cm, with intermediate grade DCIS, margins negative, 0/2 lymph nodes negative, T1CN0 stage Ia ER 100%, PR 90%, Ki-67 10%, HER-2 1+ negative  Oncotype DX: 29: 18% risk of recurrence    Treatment plan:  1. Recommended systemic adjuvant chemotherapy with Taxotere and Cytoxan every 3 weeks x4 cycles (patient refused)  2. Adjuvant radiation therapy 11/08/2018-12/05/2018  3. Adjuvant antiestrogen therapy with anastrozole started 12/05/2018 switched to letrozole 10/09/19    Letrozole toxicities: Denies any adverse effects to letrozole   Breast cancer surveillance: 1.  Mammogram 11/19/2022: Left breast calcifications 5 mm stereotactic biopsy: Benign 2. bone density 10/10/2021: T score -2.4: Osteopenia 3.  Breast exam 08/10/2023: Benign   Return to clinic in 1 year for follow-up    Iron deficiency anemia Etiology: Blood loss versus malabsorption August 2022: 3 doses of Venofer Follows with gastroenterology and has had endoscopies and colonoscopies. Lab review:  01/05/2022: Hemoglobin 13, MCV 90, 25% iron saturation, 94 ferritin  07/06/2022: Hemoglobin 13.4, MCV 89.8, iron saturation 29%, ferritin 71 08/06/2023: Hemoglobin 13.4, MCV 91.1, iron saturation 16%, TIBC 465, ferritin 35   I discussed with the patient that she does not need any IV iron at this time.  The ferritin levels have been slowly declining over time but there is still reasonable and does not require IV iron   Recheck labs in  1 year and follow-up after that to discuss  results. ------------------------------------- Assessment and Plan    Breast Cancer (Estrogen Positive) 5 years  post-diagnosis, currently on Letrozole with no significant side effects. Discussed the benefits of extending hormone therapy to 7 years for maximum benefit, especially considering the patient's strong estrogen positivity. -Continue Letrozole, ensure sufficient refills. -Consider extending therapy to 7 years, reassess in 2 years. -Order [facility] lab test for early detection of recurrence.  Migraines Under the care of two neurologists. Recently started on Emgality and Nurtec, with some improvement noted. Continues to receive Botox and Prednisone for management. -Continue current management under neurologists.  Osteopenia Last bone density scan showed progression towards osteoporosis. Patient has been on long-term Pantoprazole, which may contribute to bone health issues. Discussed potential need for bisphosphonate therapy depending on results of upcoming bone density scan. -Schedule bone density scan. -Consider bisphosphonate therapy based on scan results.  Iron Deficiency Recent labs showed low-normal ferritin levels. Patient has a history of requiring iron infusions. Discussed the importance of iron-rich diet. -Encourage iron-rich diet. -Monitor ferritin levels annually.          Orders Placed This Encounter  Procedures   CBC with Differential (Cancer Center Only)    Standing Status:   Future    Standing Expiration Date:   08/09/2024   Ferritin    Standing Status:   Future    Standing Expiration Date:   08/09/2024   Iron and Iron Binding Capacity ([facility] only)    Standing Status:   Future    Standing Expiration Date:   08/09/2024   CMP (Cancer Center only)    Standing Status:   Future    Standing Expiration Date:   08/09/2024   [facility] Only [facility] Managed)    Select as applicable. If patient is on or planning to receive immunotherapy, select drug: Not on  Immunotherapy If "Other or Multiple", Write down drug name:  Do not Delete Below This Line   ==========Department Information========== ID: [id number] Department:[facility] [address] [place] [address] Dept: [phone] Dept Fax: [phone]    Order Specific Question:   Surveillance Program draw frequency:    Answer:   Every 3 Months    Order Specific Question:   Surveillance Program draw count:    Answer:   4    Order Specific Question:   Cancer type:    Answer:   Breast    Order Specific Question:   Stage of diagnosis:    Answer:   I    Order Specific Question:   History of recurrence?    Answer:   No    Order Specific Question:   Current disease status:    Answer:   No evidence of disease    Order Specific Question:   Date of surgery (MM/DD/YYYY):    Answer:   11/27/2022    Order Specific Question:   Tissue collection date (MM/DD/YYYY):    Answer:   11/27/2022    Order Specific Question:   Is the patient receiving or planning to receive immunotherapy?    Answer:   No    Order Specific Question:   Patient status:    Answer:   Outpatient    Order Specific Question:   By placing this electronic order I confirm the testing ordered herein is medically necessary and this patient has been informed of the details of the genetic test(s) ordered, including the risks, benefits, and alternatives, and has consented to testing.    Answer:   Yes    Order Specific Question:   What type  of billing?    Answer:   Automatic Data    Order Specific Question:   CC Results    Answer:   Serena Croissant [[id number]]   The patient has a good understanding of the overall plan. she agrees with it. she will call with any problems that may develop before the next visit here. Total time spent: 30 mins including face to face time and time spent for planning, charting and co-ordination of care   Tamsen Meek,  MD 08/10/23

## 2023-08-10 NOTE — Assessment & Plan Note (Signed)
10/10/2018: Right lumpectomy: IDC grade 1, 1.2 cm, with intermediate grade DCIS, margins negative, 0/2 lymph nodes negative, T1CN0 stage Ia ER 100%, PR 90%, Ki-67 10%, HER-2 1+ negative  Oncotype DX: 29: 18% risk of recurrence    Treatment plan:  1. Recommended systemic adjuvant chemotherapy with Taxotere and Cytoxan every 3 weeks x4 cycles (patient refused)  2. Adjuvant radiation therapy 11/08/2018-12/05/2018  3. Adjuvant antiestrogen therapy with anastrozole started 12/05/2018 switched to letrozole 10/09/19    Letrozole toxicities: Denies any adverse effects to letrozole   Breast cancer surveillance: 1.  Mammogram 11/19/2022: Left breast calcifications 5 mm stereotactic biopsy: Benign 2. bone density 10/10/2021: T score -2.4: Osteopenia 3.  Breast exam 08/10/2023: Benign   Return to clinic in 1 year for follow-up

## 2023-08-10 NOTE — Assessment & Plan Note (Signed)
Etiology: Blood loss versus malabsorption August 2022: 3 doses of Venofer Follows with gastroenterology and has had endoscopies and colonoscopies. Lab review:  01/05/2022: Hemoglobin 13, MCV 90, 25% iron saturation, 94 ferritin  07/06/2022: Hemoglobin 13.4, MCV 89.8, iron saturation 29%, ferritin 71 08/06/2023: Hemoglobin 13.4, MCV 91.1, iron saturation 16%, TIBC 465, ferritin 35   I discussed with the patient that she does not need any IV iron at this time.  The ferritin levels have been slowly declining over time but there is still reasonable and does not require IV iron   Recheck labs in  1 year and follow-up after that to discuss results.

## 2023-08-11 ENCOUNTER — Encounter: Payer: Self-pay | Admitting: Hematology and Oncology

## 2023-08-11 NOTE — Telephone Encounter (Signed)
Per md orders entered for [facility]a. Requisition and all supported documents faxed to [phone]. Faxed confirmation was received.

## 2023-08-16 DIAGNOSIS — G518 Other disorders of facial nerve: Secondary | ICD-10-CM | POA: Diagnosis not present

## 2023-08-16 DIAGNOSIS — M791 Myalgia, unspecified site: Secondary | ICD-10-CM | POA: Diagnosis not present

## 2023-08-16 DIAGNOSIS — M542 Cervicalgia: Secondary | ICD-10-CM | POA: Diagnosis not present

## 2023-08-16 DIAGNOSIS — G43719 Chronic migraine without aura, intractable, without status migrainosus: Secondary | ICD-10-CM | POA: Diagnosis not present

## 2023-08-17 DIAGNOSIS — H0100A Unspecified blepharitis right eye, upper and lower eyelids: Secondary | ICD-10-CM | POA: Diagnosis not present

## 2023-08-17 DIAGNOSIS — H02055 Trichiasis without entropian left lower eyelid: Secondary | ICD-10-CM | POA: Diagnosis not present

## 2023-08-17 DIAGNOSIS — H0011 Chalazion right upper eyelid: Secondary | ICD-10-CM | POA: Diagnosis not present

## 2023-08-18 DIAGNOSIS — C50211 Malignant neoplasm of upper-inner quadrant of right female breast: Secondary | ICD-10-CM | POA: Diagnosis not present

## 2023-08-18 DIAGNOSIS — Z170 Estrogen receptor positive status [ER+]: Secondary | ICD-10-CM | POA: Diagnosis not present

## 2023-08-18 DIAGNOSIS — D509 Iron deficiency anemia, unspecified: Secondary | ICD-10-CM | POA: Diagnosis not present

## 2023-09-02 ENCOUNTER — Other Ambulatory Visit: Payer: Self-pay | Admitting: Family Medicine

## 2023-09-02 NOTE — Telephone Encounter (Signed)
Last TSH was over a year ago. Pt has had multiple acute appts but I don't see a recent f/u or CPE

## 2023-09-02 NOTE — Telephone Encounter (Signed)
Refilled for 3 mo Please schedule annual exam this winter  (labs prior or that day) If she prefers follow up that is fine

## 2023-09-03 NOTE — Telephone Encounter (Signed)
Lvmtcb, sent mychart message  

## 2023-09-06 LAB — SIGNATERA ONLY (NATERA MANAGED)
SIGNATERA MTM READOUT: 0 MTM/ml
SIGNATERA TEST RESULT: NEGATIVE

## 2023-09-08 ENCOUNTER — Telehealth: Payer: Self-pay

## 2023-09-08 NOTE — Telephone Encounter (Signed)
Called pt per MD to advise [facility] testing was negative/not detected. Pt verbalized understanding of results and knows [facility] will be in touch to schedule 3 mo repeat lab.   

## 2023-09-08 NOTE — Telephone Encounter (Signed)
Enter in error

## 2023-09-08 NOTE — Telephone Encounter (Signed)
Open in error

## 2023-09-08 NOTE — Telephone Encounter (Signed)
Marland Kitchen  sig

## 2023-09-10 ENCOUNTER — Encounter (HOSPITAL_COMMUNITY): Payer: Self-pay

## 2023-09-12 ENCOUNTER — Telehealth: Payer: Self-pay | Admitting: Family Medicine

## 2023-09-12 DIAGNOSIS — Z13220 Encounter for screening for lipoid disorders: Secondary | ICD-10-CM

## 2023-09-12 DIAGNOSIS — D509 Iron deficiency anemia, unspecified: Secondary | ICD-10-CM

## 2023-09-12 DIAGNOSIS — E039 Hypothyroidism, unspecified: Secondary | ICD-10-CM

## 2023-09-12 DIAGNOSIS — M2550 Pain in unspecified joint: Secondary | ICD-10-CM

## 2023-09-12 DIAGNOSIS — E538 Deficiency of other specified B group vitamins: Secondary | ICD-10-CM

## 2023-09-12 DIAGNOSIS — Z79899 Other long term (current) drug therapy: Secondary | ICD-10-CM | POA: Insufficient documentation

## 2023-09-12 DIAGNOSIS — R7303 Prediabetes: Secondary | ICD-10-CM

## 2023-09-12 NOTE — Telephone Encounter (Signed)
-----   Message from Alvina Chou sent at 09/06/2023 11:31 AM EDT ----- Regarding: Lab orders for Mon, 10.21.24 Patient is scheduled for CPX labs, please order future labs, Thanks , Camelia Eng

## 2023-09-13 ENCOUNTER — Other Ambulatory Visit: Payer: Medicare HMO

## 2023-09-15 DIAGNOSIS — G43019 Migraine without aura, intractable, without status migrainosus: Secondary | ICD-10-CM | POA: Diagnosis not present

## 2023-09-15 DIAGNOSIS — M791 Myalgia, unspecified site: Secondary | ICD-10-CM | POA: Diagnosis not present

## 2023-09-15 DIAGNOSIS — G518 Other disorders of facial nerve: Secondary | ICD-10-CM | POA: Diagnosis not present

## 2023-09-15 DIAGNOSIS — M542 Cervicalgia: Secondary | ICD-10-CM | POA: Diagnosis not present

## 2023-09-15 DIAGNOSIS — G43719 Chronic migraine without aura, intractable, without status migrainosus: Secondary | ICD-10-CM | POA: Diagnosis not present

## 2023-09-17 ENCOUNTER — Other Ambulatory Visit (INDEPENDENT_AMBULATORY_CARE_PROVIDER_SITE_OTHER): Payer: Medicare HMO

## 2023-09-17 DIAGNOSIS — Z79899 Other long term (current) drug therapy: Secondary | ICD-10-CM

## 2023-09-17 DIAGNOSIS — H04123 Dry eye syndrome of bilateral lacrimal glands: Secondary | ICD-10-CM | POA: Diagnosis not present

## 2023-09-17 DIAGNOSIS — H0100A Unspecified blepharitis right eye, upper and lower eyelids: Secondary | ICD-10-CM | POA: Diagnosis not present

## 2023-09-17 DIAGNOSIS — Z13220 Encounter for screening for lipoid disorders: Secondary | ICD-10-CM | POA: Diagnosis not present

## 2023-09-17 DIAGNOSIS — D509 Iron deficiency anemia, unspecified: Secondary | ICD-10-CM

## 2023-09-17 DIAGNOSIS — R7303 Prediabetes: Secondary | ICD-10-CM

## 2023-09-17 DIAGNOSIS — E039 Hypothyroidism, unspecified: Secondary | ICD-10-CM

## 2023-09-17 DIAGNOSIS — H0011 Chalazion right upper eyelid: Secondary | ICD-10-CM | POA: Diagnosis not present

## 2023-09-17 DIAGNOSIS — E538 Deficiency of other specified B group vitamins: Secondary | ICD-10-CM

## 2023-09-17 LAB — COMPREHENSIVE METABOLIC PANEL
ALT: 17 U/L (ref 0–35)
AST: 17 U/L (ref 0–37)
Albumin: 4.4 g/dL (ref 3.5–5.2)
Alkaline Phosphatase: 106 U/L (ref 39–117)
BUN: 15 mg/dL (ref 6–23)
CO2: 32 meq/L (ref 19–32)
Calcium: 9.6 mg/dL (ref 8.4–10.5)
Chloride: 98 meq/L (ref 96–112)
Creatinine, Ser: 0.64 mg/dL (ref 0.40–1.20)
GFR: 91.74 mL/min (ref >60.00)
Glucose, Bld: 93 mg/dL (ref 70–99)
Potassium: 4.6 meq/L (ref 3.5–5.1)
Sodium: 137 meq/L (ref 135–145)
Total Bilirubin: 0.5 mg/dL (ref 0.2–1.2)
Total Protein: 7.1 g/dL (ref 6.0–8.3)

## 2023-09-17 LAB — LIPID PANEL
Cholesterol: 211 mg/dL — ABNORMAL HIGH (ref 0–200)
HDL: 63.5 mg/dL (ref >39.00)
LDL Cholesterol: 131 mg/dL — ABNORMAL HIGH (ref 0–99)
NonHDL: 147.55
Total CHOL/HDL Ratio: 3
Triglycerides: 82 mg/dL (ref 0.0–149.0)
VLDL: 16.4 mg/dL (ref 0.0–40.0)

## 2023-09-17 LAB — TSH: TSH: 1.25 u[IU]/mL (ref 0.35–5.50)

## 2023-09-17 LAB — VITAMIN B12: Vitamin B-12: 621 pg/mL (ref 211–911)

## 2023-09-17 LAB — HEMOGLOBIN A1C: Hgb A1c MFr Bld: 5.7 % (ref 4.6–6.5)

## 2023-09-17 LAB — VITAMIN D 25 HYDROXY (VIT D DEFICIENCY, FRACTURES): VITD: 41.48 ng/mL (ref 30.00–100.00)

## 2023-09-17 LAB — IRON: Iron: 77 ug/dL (ref 42–145)

## 2023-09-20 ENCOUNTER — Encounter: Payer: Medicare HMO | Admitting: Family Medicine

## 2023-09-20 ENCOUNTER — Other Ambulatory Visit (INDEPENDENT_AMBULATORY_CARE_PROVIDER_SITE_OTHER): Payer: Medicare HMO | Admitting: Family Medicine

## 2023-09-20 DIAGNOSIS — D509 Iron deficiency anemia, unspecified: Secondary | ICD-10-CM

## 2023-09-20 LAB — CBC WITH DIFFERENTIAL/PLATELET
Basophils Absolute: 0.1 10*3/uL (ref 0.0–0.1)
Basophils Relative: 0.9 % (ref 0.0–3.0)
Eosinophils Absolute: 0.2 10*3/uL (ref 0.0–0.7)
Eosinophils Relative: 3.3 % (ref 0.0–5.0)
HCT: 39.5 % (ref 36.0–46.0)
Hemoglobin: 13 g/dL (ref 12.0–15.0)
Lymphocytes Relative: 35.3 % (ref 12.0–46.0)
Lymphs Abs: 2 10*3/uL (ref 0.7–4.0)
MCHC: 32.8 g/dL (ref 30.0–36.0)
MCV: 92.1 fL (ref 78.0–100.0)
Monocytes Absolute: 0.6 10*3/uL (ref 0.1–1.0)
Monocytes Relative: 11.2 % (ref 3.0–12.0)
Neutro Abs: 2.7 10*3/uL (ref 1.4–7.7)
Neutrophils Relative %: 49.3 % (ref 43.0–77.0)
Platelets: 328 10*3/uL (ref 150.0–400.0)
RBC: 4.29 Mil/uL (ref 3.87–5.11)
RDW: 12.2 % (ref 11.5–15.5)
WBC: 5.6 10*3/uL (ref 4.0–10.5)

## 2023-09-23 ENCOUNTER — Other Ambulatory Visit: Payer: Self-pay

## 2023-09-24 ENCOUNTER — Encounter: Payer: Medicare HMO | Admitting: Family Medicine

## 2023-09-24 NOTE — Progress Notes (Deleted)
Subjective:    Patient ID: Jennifer [name], female    DOB: 04-24-1956, 67 y.o.   MRN: [id number]  HPI  Here for health maintenance exam and to review chronic medical problems   Wt Readings from Last 3 Encounters:  08/10/23 128 lb 14.4 oz (58.5 kg)  07/28/23 127 lb 8 oz (57.8 kg)  07/06/23 127 lb (57.6 kg)      There were no vitals filed for this visit.  Immunization History  Administered Date(s) Administered   Fluad Quad(high Dose 65+) 09/18/2022   Fluad Trivalent(High Dose 65+) 08/30/2023   Influenza Inj Mdck Quad Pf 08/11/2019   Influenza Whole 08/21/2009, 08/28/2010   Influenza, High Dose Seasonal PF 10/28/2017, 09/13/2018, 10/10/2019, 10/22/2020, 10/10/2021   Influenza,inj,Quad PF,6+ Mos 08/28/2015, 08/03/2016, 08/05/2018   Influenza-Unspecified 09/06/2017, 08/03/2020   PFIZER Comirnaty(Gray Top)Covid-19 Tri-Sucrose Vaccine 06/14/2021   PFIZER(Purple Top)SARS-COV-2 Vaccination 01/05/2020, 01/30/2020, 09/05/2020, 10/22/2020   PNEUMOCOCCAL CONJUGATE-20 11/18/2021   Pfizer Covid-19 Vaccine Bivalent Booster 48yrs & up 11/11/2021   Pfizer(Comirnaty)Fall Seasonal Vaccine 12 years and older 08/30/2023   Pneumococcal Polysaccharide-23 10/28/2017, 10/10/2019, 10/22/2020   Tdap 10/28/2012   Zoster Recombinant(Shingrix) 08/05/2018, 09/24/2018    Health Maintenance Due  Topic Date Due   DTaP/Tdap/Td (2 - Td or Tdap) 10/28/2022   Tetanus shto   Mammogram 10/2022, had to have biopsy  Personal history of breast cancer  Takes letrozole  Self breast exam  Gyn health   Colon cancer screening colonoscopy 07/2021  Bone health  Dexa  09/2021- osteopenia  Falls Fractures Supplements  Last vitamin D Lab Results  Component Value Date   VD25OH 41.48 09/17/2023    Exercise    Mood    07/06/2023    2:11 PM 01/21/2023    4:00 PM 06/17/2021    2:51 PM 03/01/2020   12:17 PM 01/03/2019    4:51 PM  Depression screen PHQ 2/9  Decreased Interest 0 0 0 0 0  Down, Depressed,  Hopeless 0 0 0 0 0  PHQ - 2 Score 0 0 0 0 0  Altered sleeping  0 0 0   Tired, decreased energy  0 1 1   Change in appetite  0 0 0   Feeling bad or failure about yourself   0 0 0   Trouble concentrating  0 0 0   Moving slowly or fidgety/restless  0 0 0   Suicidal thoughts  0 0 0   PHQ-9 Score  0 1 1   Difficult doing work/chores  Not difficult at all  Not difficult at all     Hypothyroidism  Pt has no clinical changes No change in energy level/ hair or skin/ edema and no tremor Lab Results  Component Value Date   TSH 1.25 09/17/2023    Levothyroxine 75 mcg daily   GERD Protonix 20 mg daily  Has had GI care and EGD   B12 def  Oral supplementatoin  Lab Results  Component Value Date   VITAMINB12 621 09/17/2023    Lipid screening  Lab Results  Component Value Date   CHOL 211 (H) 09/17/2023   CHOL 207 (H) 06/13/2021   CHOL 202 (H) 02/26/2020   Lab Results  Component Value Date   HDL 63.50 09/17/2023   HDL 66.20 06/13/2021   HDL 59.90 02/26/2020   Lab Results  Component Value Date   LDLCALC 131 (H) 09/17/2023   LDLCALC 119 (H) 06/13/2021   LDLCALC 108 (H) 02/26/2020   Lab Results  Component Value  Date   TRIG 82.0 09/17/2023   TRIG 111.0 06/13/2021   TRIG 169.0 (H) 02/26/2020   Lab Results  Component Value Date   CHOLHDL 3 09/17/2023   CHOLHDL 3 06/13/2021   CHOLHDL 3 02/26/2020   Lab Results  Component Value Date   LDLDIRECT 98.6 01/10/2014   LDLDIRECT 121.8 09/16/2010   Prediabetes Lab Results  Component Value Date   HGBA1C 5.7 09/17/2023   Improved    Sees hematology for iron def Lab Results  Component Value Date   WBC 5.6 09/20/2023   HGB 13.0 09/20/2023   HCT 39.5 09/20/2023   MCV 92.1 09/20/2023   PLT 328.0 09/20/2023   Lab Results  Component Value Date   IRON 77 09/17/2023   TIBC 465 (H) 08/06/2023   FERRITIN 35 08/06/2023     Migraines Emgality Nurtec  Botox    Patient Active Problem List   Diagnosis Date Noted    Current use of proton pump inhibitor 09/12/2023   Chronic migraine without aura, with intractable migraine, so stated, with status migrainosus 07/29/2023   Migraine without aura and without status migrainosus, not intractable 07/29/2023   Acute cystitis 07/28/2023   Right hand pain 01/21/2023   Right ankle pain 01/21/2023   Thoracic back pain 01/21/2023   Pain in right shoulder 08/12/2022   Hordeolum externum (stye) 08/04/2022   Raynaud's disease 03/17/2022   Left facial swelling 01/25/2022   Left ear pain 01/23/2022   Right leg pain 10/15/2021   Microcytic anemia 07/07/2021   De Quervain's tenosynovitis, right 04/18/2021   Iron deficiency anemia 09/08/2020   Common bile duct dilatation 09/06/2020   Opacity of lung on imaging study 09/06/2020   Dizziness 07/10/2020   History of breast cancer 03/01/2020   Genetic testing 11/14/2019   Bilateral hand pain 11/03/2019   Family history of prostate cancer    Adnexal cyst 09/26/2019   Family history of breast cancer    Malignant neoplasm of upper-inner quadrant of right breast in female, estrogen receptor positive (HCC) 09/20/2018   B12 deficiency 04/13/2018   Joint pain 04/11/2018   Colon cancer screening 09/16/2017   Prediabetes 07/28/2016   Allergic rhinitis 11/13/2014   Screening for lipoid disorders 09/26/2014   Preventative health care 09/26/2014   Dysuria 08/07/2014   Routine general medical examination at a health care facility 10/28/2012   GERD 09/16/2010   Hypothyroidism 05/07/2008   Osteopenia 05/07/2008   Migraine with aura 11/11/2007   Past Medical History:  Diagnosis Date   Allergic rhinitis    Allergy    seasonal   Anemia    Anxiety    Arthritis    hands, knees   Asthma    "as a child"   Blood transfusion without reported diagnosis    "as a child"   Breast cancer (HCC) 2019   Right Breast Cancer   Cancer (HCC) 09/2018   Breast CA new DX, right breast   Cervical dysplasia    Common bile duct dilation     Diabetes mellitus without complication (HCC)    Endometriosis    Esophageal reflux    Family history of adverse reaction to anesthesia    younger sister anxiety for a dew days after   Family history of breast cancer    Family history of breast cancer    Family history of prostate cancer    Hepatic cyst 08/2020   multiple   Hepatic hemangioma 08/2020   intrahepatic hemangioma   Hiatal hernia  HSV-1 (herpes simplex virus 1) infection    Hypertension    pt.denies 10/19/22   Hypothyroid    Migraine with aura, without mention of intractable migraine without mention of status migrainosus    Osteopenia 08/2019   T score -2.2 FRAX 11% / 1.7%   Personal history of radiation therapy 2019   Right Breast Cancer   Pre-diabetes    Raynaud's disease    Past Surgical History:  Procedure Laterality Date   BIOPSY  09/12/2020   Procedure: BIOPSY;  Surgeon: Rachael Fee, MD;  Location: [facility];  Service: Endoscopy;;   BREAST BIOPSY Left 11/27/2022   MM LT BREAST BX W LOC DEV 1ST LESION IMAGE BX SPEC STEREO GUIDE 11/27/2022 [facility]   BREAST EXCISIONAL BIOPSY Bilateral over 10 years ago   benign x 2    BREAST LUMPECTOMY Right 10/10/2018   BREAST LUMPECTOMY WITH RADIOACTIVE SEED AND SENTINEL LYMPH NODE BIOPSY Right 10/10/2018   Procedure: RIGHT BREAST LUMPECTOMY WITH RADIOACTIVE SEED AND RIGHT SENTINEL LYMPH NODE BIOPSY;  Surgeon: Chevis Pretty III, MD;  Location: [facility];  Service: General;  Laterality: Right;   BREAST SURGERY     Benign breast lump excised   CERVICAL CONE BIOPSY  1985   severe dysplasia   COLONOSCOPY  2005 and dec 2019   normal   COLPOSCOPY     endoscopy  10/2018   ESOPHAGOGASTRODUODENOSCOPY (EGD) WITH PROPOFOL N/A 09/12/2020   Procedure: ESOPHAGOGASTRODUODENOSCOPY (EGD) WITH PROPOFOL;  Surgeon: Rachael Fee, MD;  Location: [facility];  Service: Endoscopy;  Laterality: N/A;   EUS N/A 09/12/2020   Procedure: UPPER ENDOSCOPIC  ULTRASOUND (EUS) RADIAL;  Surgeon: Rachael Fee, MD;  Location: [facility];  Service: Endoscopy;  Laterality: N/A;   LAPAROSCOPIC ASSISTED VAGINAL HYSTERECTOMY     LAPAROSCOPIC BILATERAL SALPINGO OOPHERECTOMY Bilateral 10/27/2019   Procedure: LAPAROSCOPIC BILATERAL SALPINGO OOPHORECTOMY WITH PERITONEAL WASHINGS;  Surgeon: Genia Del, MD;  Location: [facility];  Service: Gynecology;  Laterality: Bilateral;  request 1:00pm on Friday, Dec. 4th in [place] Iqueue time held requests one hour OR time   moles removed from upper back, face lft, 1 between breasts, upper leg left inside  10/18/2019   wearing small round bandaids on for 2 weeks   ROTATOR CUFF REPAIR Bilateral 08/2008   UPPER GASTROINTESTINAL ENDOSCOPY     Social History   Tobacco Use   Smoking status: Never    Passive exposure: Never   Smokeless tobacco: Never  Vaping Use   Vaping status: Never Used  Substance Use Topics   Alcohol use: No    Alcohol/week: 0.0 standard drinks of alcohol   Drug use: No   Family History  Problem Relation Age of Onset   Breast cancer Mother 31   Diabetes Mother    Irritable bowel syndrome Mother    Hypertension Father    Heart disease Father    Hypertension Sister    Anuerysm Sister        head   Irritable bowel syndrome Sister    Hypertension Sister    Anxiety disorder Sister    Other Sister        prediabetes   Celiac disease Brother    Osteoporosis Maternal Grandmother    Diabetes Maternal Grandmother    COPD Paternal Grandmother    Heart disease Paternal Grandfather    Breast cancer Cousin 57       pat first cousin   Breast cancer Cousin  mat second cousin with breast cancer in her 30s   Thyroid cancer Cousin 61       pat first cousin   Prostate cancer Other 47   Colon cancer Neg Hx    Stomach cancer Neg Hx    Esophageal cancer Neg Hx    Pancreatic cancer Neg Hx    Rectal cancer Neg Hx    Colon polyps Neg Hx    Crohn's disease Neg  Hx    Ulcerative colitis Neg Hx    Allergies  Allergen Reactions   Ciprofloxacin Other (See Comments)    Dizziness    Codeine Phosphate Other (See Comments)    Dizziness, heart palpitations, nervous   Decongestant [Pseudoephedrine] Other (See Comments)    dizziness   Diclofenac    Diphenhydramine Hcl (Sleep)    Flonase [Fluticasone Propionate] Other (See Comments)    Worsens her migraine    Fluticasone    Hydrocodone Other (See Comments)    Made nervous--10/09 surgery rotator cuff   Macrobid [Nitrofurantoin]     headaches   Nasal Spray    Nasonex [Mometasone]     Migraines   Reglan [Metoclopramide] Other (See Comments)    Dizziness   Topamax [Topiramate] Other (See Comments)    Dizziness    Benadryl [Diphenhydramine Hcl] Palpitations   Sulfa Antibiotics Anxiety   Current Outpatient Medications on File Prior to Visit  Medication Sig Dispense Refill   ALPRAZolam (XANAX) 0.5 MG tablet TAKE 1/2 TO 1 TABLET BY MOUTH ONCE DAILY AS NEEDED FOR SLEEP/ANXIETY 15 tablet 2   ALPRAZolam (XANAX) 0.5 MG tablet Take by mouth.     atenolol (TENORMIN) 25 MG tablet Take 12.5 mg by mouth 3 (three) times daily. For migraine prevention     B Complex Vitamins (B COMPLEX PO) Take by mouth.     baclofen (LIORESAL) 10 MG tablet Take by mouth as needed.     CALCIUM CARBONATE-VITAMIN D PO Take 1 tablet by mouth daily. 1000 mg     cephALEXin (KEFLEX) 500 MG capsule Take 500 mg by mouth 2 (two) times daily.     famotidine (PEPCID) 40 MG tablet Take 40 mg by mouth 2 (two) times daily.      fosfomycin (MONUROL) 3 g PACK SMARTSIG:3 Gram(s) By Mouth Once     Galcanezumab-gnlm (EMGALITY) 120 MG/ML SOAJ Inject 120 mg into the skin every 30 (thirty) days. 1.12 mL 11   hydrocortisone 2.5 % cream Apply to affected areas BID PRN as directed. 30 g 3   hydrOXYzine (ATARAX) 25 MG tablet Take 37.5 mg by mouth at bedtime as needed.     ketorolac (TORADOL) 10 MG tablet Take by mouth.     letrozole (FEMARA) 2.5 MG  tablet Take 1 tablet (2.5 mg total) by mouth daily. 90 tablet 3   levocetirizine (XYZAL) 5 MG tablet Take 5 mg by mouth at bedtime.   3   levothyroxine (SYNTHROID) 75 MCG tablet TAKE 1 TABLET BY MOUTH EVERY DAY BEFORE BREAKFAST 90 tablet 0   magnesium oxide (MAG-OX) 400 MG tablet Take by mouth.     Magnesium Oxide 400 (240 Mg) MG TABS Take 400 mg by mouth daily.      metFORMIN (GLUCOPHAGE-XR) 500 MG 24 hr tablet TAKE 1 TABLET BY MOUTH EVERY DAY WITH BREAKFAST 90 tablet 1   Multiple Vitamin (MULTIVITAMIN WITH MINERALS) TABS tablet Take 1 tablet by mouth daily.     nabumetone (RELAFEN) 500 MG tablet Take 500 mg by mouth 2 (two)  times daily.     neomycin-polymyxin b-dexamethasone (MAXITROL) 3.5-10000-0.1 SUSP 1 drop 3 (three) times daily.     Omega-3 Fatty Acids (FISH OIL) 1000 MG CAPS Take 1,000 mg by mouth daily.      OnabotulinumtoxinA (BOTOX IJ) Inject 1 Dose as directed every 6 (six) weeks.      ondansetron (ZOFRAN ODT) 4 MG disintegrating tablet Take 1 tablet (4 mg total) by mouth every 8 (eight) hours as needed. 20 tablet 0   ondansetron (ZOFRAN) 8 MG tablet Take by mouth.     pantoprazole (PROTONIX) 20 MG tablet TAKE 1 TABLET BY MOUTH EVERY DAY 90 tablet 1   predniSONE (DELTASONE) 20 MG tablet Take 4 pills once daily by mouth for 3 days, then 3 pills daily for 3 days, then 2 pills daily for 3 days then 1 pill daily for 3 days then stop 30 tablet 0   PRESCRIPTION MEDICATION Inject into the skin every 6 (six) weeks. Steroid Trigger     Probiotic Product (PROBIOTIC-10 PO) Take 1 capsule by mouth daily.      promethazine (PHENERGAN) 25 MG tablet Take 25 mg by mouth daily as needed.     Rimegepant Sulfate (NURTEC) 75 MG TBDP Take 1 tablet (75 mg total) by mouth daily as needed. For migraines. Take as close to onset of migraine as possible. One daily maximum. (Patient not taking: Reported on 08/10/2023) 16 tablet 11   rizatriptan (MAXALT) 10 MG tablet Take 1 tablet (10 mg total) by mouth once as  needed for up to 10 days for migraine. May repeat in 2 hours if needed 10 tablet 0   triamcinolone cream (KENALOG) 0.1 % Apply to affected areas BID PRN for up to two weeks at a time as directed. 30 g 1   TURMERIC PO Take 1 tablet by mouth daily.     Current Facility-Administered Medications on File Prior to Visit  Medication Dose Route Frequency Provider Last Rate Last Admin   Galcanezumab-gnlm SOAJ 240 mg  240 mg Subcutaneous Once         Review of Systems     Objective:   Physical Exam        Assessment & Plan:   Problem List Items Addressed This Visit   None

## 2023-10-01 ENCOUNTER — Ambulatory Visit (INDEPENDENT_AMBULATORY_CARE_PROVIDER_SITE_OTHER): Payer: Medicare HMO | Admitting: Family Medicine

## 2023-10-01 ENCOUNTER — Encounter: Payer: Self-pay | Admitting: Family Medicine

## 2023-10-01 VITALS — BP 128/66 | HR 71 | Temp 97.7°F | Ht 59.75 in | Wt 128.0 lb

## 2023-10-01 DIAGNOSIS — K219 Gastro-esophageal reflux disease without esophagitis: Secondary | ICD-10-CM

## 2023-10-01 DIAGNOSIS — Z1211 Encounter for screening for malignant neoplasm of colon: Secondary | ICD-10-CM

## 2023-10-01 DIAGNOSIS — M8580 Other specified disorders of bone density and structure, unspecified site: Secondary | ICD-10-CM | POA: Diagnosis not present

## 2023-10-01 DIAGNOSIS — Z79899 Other long term (current) drug therapy: Secondary | ICD-10-CM

## 2023-10-01 DIAGNOSIS — D509 Iron deficiency anemia, unspecified: Secondary | ICD-10-CM | POA: Diagnosis not present

## 2023-10-01 DIAGNOSIS — G43711 Chronic migraine without aura, intractable, with status migrainosus: Secondary | ICD-10-CM

## 2023-10-01 DIAGNOSIS — R7303 Prediabetes: Secondary | ICD-10-CM

## 2023-10-01 DIAGNOSIS — E039 Hypothyroidism, unspecified: Secondary | ICD-10-CM

## 2023-10-01 DIAGNOSIS — E785 Hyperlipidemia, unspecified: Secondary | ICD-10-CM | POA: Diagnosis not present

## 2023-10-01 DIAGNOSIS — Z0000 Encounter for general adult medical examination without abnormal findings: Secondary | ICD-10-CM

## 2023-10-01 DIAGNOSIS — E538 Deficiency of other specified B group vitamins: Secondary | ICD-10-CM | POA: Diagnosis not present

## 2023-10-01 MED ORDER — LEVOTHYROXINE SODIUM 75 MCG PO TABS: ORAL_TABLET | ORAL | 0 refills | Status: DC

## 2023-10-01 MED ORDER — METFORMIN HCL ER 500 MG PO TB24: 500.0000 mg | ORAL_TABLET | Freq: Every day | ORAL | 3 refills | Status: DC

## 2023-10-01 NOTE — Assessment & Plan Note (Signed)
Dexa 09/2021 One fall/no fractures D level in normal range   Discussed fall prevention, supplements and exercise for bone density   Has another dexa upcoming  Sees onc for breast cancer ant takes letrazole  Does not tolerate oral bisphosphenates  May be a candidate for infusion treatment  Also followed by onc

## 2023-10-01 NOTE — Progress Notes (Signed)
Subjective:    Patient ID: Jennifer [name], female    DOB: 1956/11/15, 67 y.o.   MRN: [id number]  HPI  Here for health maintenance exam and to review chronic medical problems   Wt Readings from Last 3 Encounters:  10/01/23 128 lb (58.1 kg)  08/10/23 128 lb 14.4 oz (58.5 kg)  07/28/23 127 lb 8 oz (57.8 kg)   25.21 kg/m  Vitals:   10/01/23 1059  BP: 128/66  Pulse: 71  Temp: 97.7 F (36.5 C)  SpO2: 97%    Immunization History  Administered Date(s) Administered   Fluad Quad(high Dose 65+) 09/18/2022   Fluad Trivalent(High Dose 65+) 08/30/2023   Influenza Inj Mdck Quad Pf 08/11/2019   Influenza Whole 08/21/2009, 08/28/2010   Influenza, High Dose Seasonal PF 10/28/2017, 09/13/2018, 10/10/2019, 10/22/2020, 10/10/2021   Influenza,inj,Quad PF,6+ Mos 08/28/2015, 08/03/2016, 08/05/2018   Influenza-Unspecified 09/06/2017, 08/03/2020   PFIZER Comirnaty(Gray Top)Covid-19 Tri-Sucrose Vaccine 06/14/2021   PFIZER(Purple Top)SARS-COV-2 Vaccination 01/05/2020, 01/30/2020, 09/05/2020, 10/22/2020   PNEUMOCOCCAL CONJUGATE-20 11/18/2021   Pfizer Covid-19 Vaccine Bivalent Booster 43yrs & up 11/11/2021   Pfizer(Comirnaty)Fall Seasonal Vaccine 12 years and older 08/30/2023   Pneumococcal Polysaccharide-23 10/28/2017, 10/10/2019, 10/22/2020   Tdap 10/28/2012   Zoster Recombinant(Shingrix) 08/05/2018, 09/24/2018    There are no preventive care reminders to display for this patient.  Doing much better now with emgality for migraine Seeing Dr Vernard Gambles one per week  Manages her triggers best she can   Tetanus shot - due   Mammogram 10/2022, had to have biopsy / scheduled already  Personal history of breast cancer  Genetic testing was favorable  Continues oncology  Takes letrozole - planning on 7 year course  Self breast exam- no lumps or changes   Gyn health-no changes    Colon cancer screening colonoscopy 07/2021 5 y recall   Bone health  Dexa  09/2021- osteopenia -has this  scheduled  Falls- one in February (propped her foot up in bathroom and lost balance) -no fracture but had to do therapy for shoulder  On letrazole  Cannot tolerate bisphosphenates due to GI side effects  Fractures-none  Supplements  Last vitamin D Lab Results  Component Value Date   VD25OH 41.48 09/17/2023    Exercise : Treadmill  Has a therapy bike also  Is open to strength training     Mood    10/01/2023   11:03 AM 07/06/2023    2:11 PM 01/21/2023    4:00 PM 06/17/2021    2:51 PM 03/01/2020   12:17 PM  Depression screen PHQ 2/9  Decreased Interest 0 0 0 0 0  Down, Depressed, Hopeless 0 0 0 0 0  PHQ - 2 Score 0 0 0 0 0  Altered sleeping 0  0 0 0  Tired, decreased energy 0  0 1 1  Change in appetite 0  0 0 0  Feeling bad or failure about yourself  0  0 0 0  Trouble concentrating 0  0 0 0  Moving slowly or fidgety/restless 0  0 0 0  Suicidal thoughts 0  0 0 0  PHQ-9 Score 0  0 1 1  Difficult doing work/chores Not difficult at all  Not difficult at all  Not difficult at all    Hypothyroidism  Pt has no clinical changes No change in energy level/ hair or skin/ edema and no tremor Lab Results  Component Value Date   TSH 1.25 09/17/2023    Levothyroxine 75 mcg daily  GERD Protonix 20 mg daily  Has had GI care and EGD   B12 def  Oral supplementatoin  Lab Results  Component Value Date   VITAMINB12 621 09/17/2023    Lipid screening  Lab Results  Component Value Date   CHOL 211 (H) 09/17/2023   CHOL 207 (H) 06/13/2021   CHOL 202 (H) 02/26/2020   Lab Results  Component Value Date   HDL 63.50 09/17/2023   HDL 66.20 06/13/2021   HDL 59.90 02/26/2020   Lab Results  Component Value Date   LDLCALC 131 (H) 09/17/2023   LDLCALC 119 (H) 06/13/2021   LDLCALC 108 (H) 02/26/2020   Lab Results  Component Value Date   TRIG 82.0 09/17/2023   TRIG 111.0 06/13/2021   TRIG 169.0 (H) 02/26/2020   Lab Results  Component Value Date   CHOLHDL 3 09/17/2023    CHOLHDL 3 06/13/2021   CHOLHDL 3 02/26/2020   Lab Results  Component Value Date   LDLDIRECT 98.6 01/10/2014   LDLDIRECT 121.8 09/16/2010   Had some bad eating with birthday parties  LDL is up  Will get back on track   Prediabetes Lab Results  Component Value Date   HGBA1C 5.7 09/17/2023   Improved  Metformin xr 500 mg daily    Sees hematology for iron def Lab Results  Component Value Date   WBC 5.6 09/20/2023   HGB 13.0 09/20/2023   HCT 39.5 09/20/2023   MCV 92.1 09/20/2023   PLT 328.0 09/20/2023   Lab Results  Component Value Date   IRON 77 09/17/2023   TIBC 465 (H) 08/06/2023   FERRITIN 35 08/06/2023     Migraines Emgality Nurtec  Botox    Patient Active Problem List   Diagnosis Date Noted   Current use of proton pump inhibitor 09/12/2023   Chronic migraine without aura, with intractable migraine, so stated, with status migrainosus 07/29/2023   Migraine without aura and without status migrainosus, not intractable 07/29/2023   Thoracic back pain 01/21/2023   Pain in right shoulder 08/12/2022   Raynaud's disease 03/17/2022   Iron deficiency anemia 09/08/2020   Common bile duct dilatation 09/06/2020   History of breast cancer 03/01/2020   Genetic testing 11/14/2019   Family history of prostate cancer    Family history of breast cancer    Malignant neoplasm of upper-inner quadrant of right breast in female, estrogen receptor positive (HCC) 09/20/2018   B12 deficiency 04/13/2018   Joint pain 04/11/2018   Colon cancer screening 09/16/2017   Prediabetes 07/28/2016   Allergic rhinitis 11/13/2014   Hyperlipidemia, mild 09/26/2014   Routine general medical examination at a health care facility 10/28/2012   GERD 09/16/2010   Hypothyroidism 05/07/2008   Osteopenia 05/07/2008   Migraine with aura 11/11/2007   Past Medical History:  Diagnosis Date   Allergic rhinitis    Allergy    seasonal   Anemia    Anxiety    Arthritis    hands, knees   Asthma     "as a child"   Blood transfusion without reported diagnosis    "as a child"   Breast cancer (HCC) 2019   Right Breast Cancer   Cancer (HCC) 09/2018   Breast CA new DX, right breast   Cervical dysplasia    Common bile duct dilation    Diabetes mellitus without complication (HCC)    Endometriosis    Esophageal reflux    Family history of adverse reaction to anesthesia    younger sister  anxiety for a dew days after   Family history of breast cancer    Family history of breast cancer    Family history of prostate cancer    Hepatic cyst 08/2020   multiple   Hepatic hemangioma 08/2020   intrahepatic hemangioma   Hiatal hernia    HSV-1 (herpes simplex virus 1) infection    Hyperlipidemia    Hypertension    pt.denies 10/19/22   Hypothyroid    Migraine with aura, without mention of intractable migraine without mention of status migrainosus    Osteopenia 08/2019   T score -2.2 FRAX 11% / 1.7%   Personal history of radiation therapy 2019   Right Breast Cancer   Pre-diabetes    Raynaud's disease    Past Surgical History:  Procedure Laterality Date   ABDOMINAL HYSTERECTOMY     BIOPSY  09/12/2020   Procedure: BIOPSY;  Surgeon: Rachael Fee, MD;  Location: [facility];  Service: Endoscopy;;   BREAST BIOPSY Left 11/27/2022   MM LT BREAST BX W LOC DEV 1ST LESION IMAGE BX SPEC STEREO GUIDE 11/27/2022 [facility]   BREAST EXCISIONAL BIOPSY Bilateral over 10 years ago   benign x 2    BREAST LUMPECTOMY Right 10/10/2018   BREAST LUMPECTOMY WITH RADIOACTIVE SEED AND SENTINEL LYMPH NODE BIOPSY Right 10/10/2018   Procedure: RIGHT BREAST LUMPECTOMY WITH RADIOACTIVE SEED AND RIGHT SENTINEL LYMPH NODE BIOPSY;  Surgeon: Chevis Pretty III, MD;  Location: [facility];  Service: General;  Laterality: Right;   BREAST SURGERY     Benign breast lump excised   CERVICAL CONE BIOPSY  1985   severe dysplasia   COLONOSCOPY  2005 and dec 2019   normal   COLPOSCOPY     endoscopy   10/2018   ESOPHAGOGASTRODUODENOSCOPY (EGD) WITH PROPOFOL N/A 09/12/2020   Procedure: ESOPHAGOGASTRODUODENOSCOPY (EGD) WITH PROPOFOL;  Surgeon: Rachael Fee, MD;  Location: [facility];  Service: Endoscopy;  Laterality: N/A;   EUS N/A 09/12/2020   Procedure: UPPER ENDOSCOPIC ULTRASOUND (EUS) RADIAL;  Surgeon: Rachael Fee, MD;  Location: [facility];  Service: Endoscopy;  Laterality: N/A;   LAPAROSCOPIC ASSISTED VAGINAL HYSTERECTOMY     LAPAROSCOPIC BILATERAL SALPINGO OOPHERECTOMY Bilateral 10/27/2019   Procedure: LAPAROSCOPIC BILATERAL SALPINGO OOPHORECTOMY WITH PERITONEAL WASHINGS;  Surgeon: Genia Del, MD;  Location: [facility] ;  Service: Gynecology;  Laterality: Bilateral;  request 1:00pm on Friday, Dec. 4th in [place] Iqueue time held requests one hour OR time   moles removed from upper back, face lft, 1 between breasts, upper leg left inside  10/18/2019   wearing small round bandaids on for 2 weeks   ROTATOR CUFF REPAIR Bilateral 08/2008   UPPER GASTROINTESTINAL ENDOSCOPY     Social History   Tobacco Use   Smoking status: Never    Passive exposure: Never   Smokeless tobacco: Never  Vaping Use   Vaping status: Never Used  Substance Use Topics   Alcohol use: No   Drug use: No   Family History  Problem Relation Age of Onset   Breast cancer Mother 6   Diabetes Mother    Irritable bowel syndrome Mother    Cancer Mother    Hearing loss Mother    Hyperlipidemia Mother    Miscarriages / Stillbirths Mother    Hypertension Father    Heart disease Father    Hypertension Sister    Anuerysm Sister        head   Depression Sister  Irritable bowel syndrome Sister    Hypertension Sister    Anxiety disorder Sister    Other Sister        prediabetes   Celiac disease Brother    Diabetes Brother    Osteoporosis Maternal Grandmother    Diabetes Maternal Grandmother    Arthritis Maternal Grandmother    COPD Paternal Grandmother    Heart  disease Paternal Grandfather    Breast cancer Cousin 72       pat first cousin   Breast cancer Cousin        mat second cousin with breast cancer in her 30s   Thyroid cancer Cousin 90       pat first cousin   Prostate cancer Other 75   Colon cancer Neg Hx    Stomach cancer Neg Hx    Esophageal cancer Neg Hx    Pancreatic cancer Neg Hx    Rectal cancer Neg Hx    Colon polyps Neg Hx    Crohn's disease Neg Hx    Ulcerative colitis Neg Hx    Allergies  Allergen Reactions   Ciprofloxacin Other (See Comments)    Dizziness    Codeine Phosphate Other (See Comments)    Dizziness, heart palpitations, nervous   Decongestant [Pseudoephedrine] Other (See Comments)    dizziness   Diclofenac    Diphenhydramine Hcl (Sleep)    Flonase [Fluticasone Propionate] Other (See Comments)    Worsens her migraine    Fluticasone    Hydrocodone Other (See Comments)    Made nervous--10/09 surgery rotator cuff   Macrobid [Nitrofurantoin]     headaches   Nasal Spray    Nasonex [Mometasone]     Migraines   Reglan [Metoclopramide] Other (See Comments)    Dizziness   Topamax [Topiramate] Other (See Comments)    Dizziness    Benadryl [Diphenhydramine Hcl] Palpitations   Sulfa Antibiotics Anxiety   Current Outpatient Medications on File Prior to Visit  Medication Sig Dispense Refill   ALPRAZolam (XANAX) 0.5 MG tablet TAKE 1/2 TO 1 TABLET BY MOUTH ONCE DAILY AS NEEDED FOR SLEEP/ANXIETY 15 tablet 2   ALPRAZolam (XANAX) 0.5 MG tablet Take by mouth.     atenolol (TENORMIN) 25 MG tablet Take 12.5 mg by mouth 3 (three) times daily. For migraine prevention     B Complex Vitamins (B COMPLEX PO) Take by mouth.     baclofen (LIORESAL) 10 MG tablet Take by mouth as needed.     CALCIUM CARBONATE-VITAMIN D PO Take 1 tablet by mouth daily. 1000 mg     famotidine (PEPCID) 40 MG tablet Take 40 mg by mouth 2 (two) times daily.      Galcanezumab-gnlm (EMGALITY) 120 MG/ML SOAJ Inject 120 mg into the skin every 30  (thirty) days. 1.12 mL 11   hydrocortisone 2.5 % cream Apply to affected areas BID PRN as directed. 30 g 3   hydrOXYzine (ATARAX) 25 MG tablet Take 37.5 mg by mouth at bedtime as needed.     ketorolac (TORADOL) 10 MG tablet Take by mouth.     letrozole (FEMARA) 2.5 MG tablet Take 1 tablet (2.5 mg total) by mouth daily. 90 tablet 3   levocetirizine (XYZAL) 5 MG tablet Take 5 mg by mouth at bedtime.   3   magnesium oxide (MAG-OX) 400 MG tablet Take by mouth.     Magnesium Oxide 400 (240 Mg) MG TABS Take 400 mg by mouth daily.      Multiple Vitamin (MULTIVITAMIN WITH  MINERALS) TABS tablet Take 1 tablet by mouth daily.     nabumetone (RELAFEN) 500 MG tablet Take 500 mg by mouth 2 (two) times daily.     neomycin-polymyxin b-dexamethasone (MAXITROL) 3.5-10000-0.1 SUSP 1 drop 3 (three) times daily.     Omega-3 Fatty Acids (FISH OIL) 1000 MG CAPS Take 1,000 mg by mouth daily.      OnabotulinumtoxinA (BOTOX IJ) Inject 1 Dose as directed every 6 (six) weeks.      ondansetron (ZOFRAN ODT) 4 MG disintegrating tablet Take 1 tablet (4 mg total) by mouth every 8 (eight) hours as needed. 20 tablet 0   pantoprazole (PROTONIX) 20 MG tablet TAKE 1 TABLET BY MOUTH EVERY DAY 90 tablet 1   PRESCRIPTION MEDICATION Inject into the skin every 6 (six) weeks. Steroid Trigger     Probiotic Product (PROBIOTIC-10 PO) Take 1 capsule by mouth daily.      promethazine (PHENERGAN) 25 MG tablet Take 25 mg by mouth daily as needed.     rizatriptan (MAXALT) 10 MG tablet Take 1 tablet (10 mg total) by mouth once as needed for up to 10 days for migraine. May repeat in 2 hours if needed 10 tablet 0   triamcinolone cream (KENALOG) 0.1 % Apply to affected areas BID PRN for up to two weeks at a time as directed. 30 g 1   TURMERIC PO Take 1 tablet by mouth daily.     Current Facility-Administered Medications on File Prior to Visit  Medication Dose Route Frequency Provider Last Rate Last Admin   Galcanezumab-gnlm SOAJ 240 mg  240 mg  Subcutaneous Once         Review of Systems  Constitutional:  Negative for activity change, appetite change, fatigue, fever and unexpected weight change.  HENT:  Negative for congestion, ear pain, rhinorrhea, sinus pressure and sore throat.   Eyes:  Negative for pain, redness and visual disturbance.  Respiratory:  Negative for cough, shortness of breath and wheezing.   Cardiovascular:  Negative for chest pain and palpitations.  Gastrointestinal:  Negative for abdominal pain, blood in stool, constipation and diarrhea.  Endocrine: Negative for polydipsia and polyuria.  Genitourinary:  Negative for dysuria, frequency and urgency.  Musculoskeletal:  Negative for arthralgias, back pain and myalgias.  Skin:  Negative for pallor and rash.  Allergic/Immunologic: Negative for environmental allergies.  Neurological:  Positive for headaches. Negative for dizziness and syncope.  Hematological:  Negative for adenopathy. Does not bruise/bleed easily.  Psychiatric/Behavioral:  Negative for decreased concentration and dysphoric mood. The patient is not nervous/anxious.        Objective:   Physical Exam Constitutional:      General: She is not in acute distress.    Appearance: Normal appearance. She is well-developed and normal weight. She is not ill-appearing or diaphoretic.  HENT:     Head: Normocephalic and atraumatic.     Right Ear: Tympanic membrane, ear canal and external ear normal.     Left Ear: Tympanic membrane, ear canal and external ear normal.     Nose: Nose normal. No congestion.     Mouth/Throat:     Mouth: Mucous membranes are moist.     Pharynx: Oropharynx is clear. No posterior oropharyngeal erythema.  Eyes:     General: No scleral icterus.    Extraocular Movements: Extraocular movements intact.     Conjunctiva/sclera: Conjunctivae normal.     Pupils: Pupils are equal, round, and reactive to light.  Neck:     Thyroid: No thyromegaly.  Vascular: No carotid bruit or JVD.   Cardiovascular:     Rate and Rhythm: Normal rate and regular rhythm.     Pulses: Normal pulses.     Heart sounds: Normal heart sounds.     No gallop.  Pulmonary:     Effort: Pulmonary effort is normal. No respiratory distress.     Breath sounds: Normal breath sounds. No wheezing.     Comments: Good air exch Chest:     Chest wall: No tenderness.  Abdominal:     General: Bowel sounds are normal. There is no distension or abdominal bruit.     Palpations: Abdomen is soft. There is no mass.     Tenderness: There is no abdominal tenderness.     Hernia: No hernia is present.  Genitourinary:    Comments: Breast exam: No mass, nodules, thickening, tenderness, bulging, retraction, inflamation, nipple discharge or skin changes noted.  No axillary or clavicular LA.     Musculoskeletal:        General: No tenderness. Normal range of motion.     Cervical back: Normal range of motion and neck supple. No rigidity. No muscular tenderness.     Right lower leg: No edema.     Left lower leg: No edema.     Comments: No kyphosis   Lymphadenopathy:     Cervical: No cervical adenopathy.  Skin:    General: Skin is warm and dry.     Coloration: Skin is not pale.     Findings: No erythema or rash.     Comments: Solar lentigines diffusely   Neurological:     Mental Status: She is alert. Mental status is at baseline.     Cranial Nerves: No cranial nerve deficit.     Motor: No abnormal muscle tone.     Coordination: Coordination normal.     Gait: Gait normal.     Deep Tendon Reflexes: Reflexes are normal and symmetric. Reflexes normal.  Psychiatric:        Mood and Affect: Mood normal.        Cognition and Memory: Cognition and memory normal.           Assessment & Plan:   Problem List Items Addressed This Visit       Cardiovascular and Mediastinum   Chronic migraine without aura, with intractable migraine, so stated, with status migrainosus    Doing much better with emgality prescription  by her neurologist Dr Lucia Gaskins  Less frequent/less severe          Digestive   GERD    Conitnues protonix 20 mg daily  Watching B12 and D levels  Watches diet for triggers         Endocrine   Hypothyroidism    Hypothyroidism  Pt has no clinical changes No change in energy level/ hair or skin/ edema and no tremor Lab Results  Component Value Date   TSH 1.25 09/17/2023    Plan to continue levothyroxine 75 mcg daily      Relevant Medications   levothyroxine (SYNTHROID) 75 MCG tablet     Musculoskeletal and Integument   Osteopenia    Dexa 09/2021 One fall/no fractures D level in normal range   Discussed fall prevention, supplements and exercise for bone density   Has another dexa upcoming  Sees onc for breast cancer ant takes letrazole  Does not tolerate oral bisphosphenates  May be a candidate for infusion treatment  Also followed by onc  Other   B12 deficiency    Lab Results  Component Value Date   VITAMINB12 621 09/17/2023   On ppi Continues oral supplementation       Colon cancer screening    Colonoscopy 07/2021 with 5 y recall       Current use of proton pump inhibitor    Lab Results  Component Value Date   VITAMINB12 621 09/17/2023   Last vitamin D Lab Results  Component Value Date   VD25OH 41.48 09/17/2023   Orally supplementing  Watching renal function       Hyperlipidemia, mild    Disc goals for lipids and reasons to control them Rev last labs with pt Rev low sat fat diet in detail LDL up to 131 - unusual for her   Will work on diet  HDL is good        Iron deficiency anemia    Continues heme onc follow up  No IV iron recently (did in 2022) Has had GI w/u      Prediabetes    Lab Results  Component Value Date   HGBA1C 5.7 09/17/2023   Improved Low glycemic diet  Metformin xr 500 mg daily      Routine general medical examination at a health care facility - Primary    Reviewed health habits including diet and  exercise and skin cancer prevention Reviewed appropriate screening tests for age  Also reviewed health mt list, fam hx and immunization status , as well as social and family history   See HPI Labs reviewed and ordered Instructed to get tetanus shot at pharmacy  Mammogram due after 11/20/23    has oncology/personal cancer history  Colonoscopy 07/2021 with 5 y recall  Last PHQ 0

## 2023-10-01 NOTE — Assessment & Plan Note (Signed)
Hypothyroidism  Pt has no clinical changes No change in energy level/ hair or skin/ edema and no tremor Lab Results  Component Value Date   TSH 1.25 09/17/2023    Plan to continue levothyroxine 75 mcg daily

## 2023-10-01 NOTE — Assessment & Plan Note (Signed)
Lab Results  Component Value Date   VITAMINB12 621 09/17/2023   On ppi Continues oral supplementation

## 2023-10-01 NOTE — Assessment & Plan Note (Signed)
Colonoscopy 07/2021 with 5 y recall

## 2023-10-01 NOTE — Assessment & Plan Note (Signed)
Conitnues protonix 20 mg daily  Watching B12 and D levels  Watches diet for triggers

## 2023-10-01 NOTE — Assessment & Plan Note (Signed)
Lab Results  Component Value Date   HGBA1C 5.7 09/17/2023   Improved Low glycemic diet  Metformin xr 500 mg daily

## 2023-10-01 NOTE — Patient Instructions (Addendum)
If you want a tetanus shot ask your pharmacist   You are due for mammogram after 12/28  Also get your bone density test as planned   Add some strength training to your routine, this is important for bone and brain health and can reduce your risk of falls and help your body use insulin properly and regulate weight  Light weights, exercise bands , and internet videos are a good way to start  Yoga (chair or regular), machines , floor exercises or a gym with machines are also good options    For cholesterol  Avoid red meat/ fried foods/ egg yolks/ fatty breakfast meats/ butter, cheese and high fat dairy/ and shellfish

## 2023-10-01 NOTE — Assessment & Plan Note (Signed)
Continues heme onc follow up  No IV iron recently (did in 2022) Has had GI w/u

## 2023-10-01 NOTE — Assessment & Plan Note (Signed)
Doing much better with emgality prescription by her neurologist Dr Lucia Gaskins  Less frequent/less severe

## 2023-10-01 NOTE — Assessment & Plan Note (Signed)
Lab Results  Component Value Date   VITAMINB12 621 09/17/2023   Last vitamin D Lab Results  Component Value Date   VD25OH 41.48 09/17/2023   Orally supplementing  Watching renal function

## 2023-10-01 NOTE — Assessment & Plan Note (Signed)
Disc goals for lipids and reasons to control them Rev last labs with pt Rev low sat fat diet in detail LDL up to 131 - unusual for her   Will work on diet  HDL is good

## 2023-10-01 NOTE — Assessment & Plan Note (Signed)
Reviewed health habits including diet and exercise and skin cancer prevention Reviewed appropriate screening tests for age  Also reviewed health mt list, fam hx and immunization status , as well as social and family history   See HPI Labs reviewed and ordered Instructed to get tetanus shot at pharmacy  Mammogram due after 11/20/23    has oncology/personal cancer history  Colonoscopy 07/2021 with 5 y recall  Last PHQ 0

## 2023-10-12 DIAGNOSIS — G43719 Chronic migraine without aura, intractable, without status migrainosus: Secondary | ICD-10-CM | POA: Diagnosis not present

## 2023-10-12 DIAGNOSIS — G518 Other disorders of facial nerve: Secondary | ICD-10-CM | POA: Diagnosis not present

## 2023-10-12 DIAGNOSIS — M542 Cervicalgia: Secondary | ICD-10-CM | POA: Diagnosis not present

## 2023-10-12 DIAGNOSIS — M791 Myalgia, unspecified site: Secondary | ICD-10-CM | POA: Diagnosis not present

## 2023-10-19 ENCOUNTER — Telehealth: Payer: Self-pay | Admitting: *Deleted

## 2023-10-19 ENCOUNTER — Ambulatory Visit: Payer: Medicare HMO | Admitting: Neurology

## 2023-10-19 ENCOUNTER — Encounter: Payer: Self-pay | Admitting: Neurology

## 2023-10-19 DIAGNOSIS — G43711 Chronic migraine without aura, intractable, with status migrainosus: Secondary | ICD-10-CM

## 2023-10-19 MED ORDER — EMGALITY 120 MG/ML ~~LOC~~ SOAJ: 120.0000 mg | SUBCUTANEOUS | 11 refills | Status: DC

## 2023-10-19 NOTE — Progress Notes (Signed)
[facility]    Provider:  Dr Lucia Gaskins Requesting Provider: Milinda Antis Audrie Gallus, MD Primary Care Provider:  Judy Pimple, MD  CC:  migraines  10/19/2023: She had 50% improvement with botox daily migraines and headaches. After botox she had 50% improvement to 15 migraine days a month and daily headaches and further improvement from [facility] but still having a burden of migraines , one migraine can last 3 days, triptan helps. Emgality has helped 50% but still have chronic migraines. 23rd of October Dr. Neale Burly does her botox for migraines every 3 months. She also gets trigger point injections. The week before she takes emgality she gets a little worse. Last emgality was on 10/01/2023. Take injection gaves her a sample she is in the doughnot hole. Told her she can take it every 28 days, changed script and if they fill 3-4 days early it fine to take early if it is wearing off.  Started Emgality August 6th so 4 months this should be close to maximum efficacy. She has had increasing migraines these 3 months but she has had a lot happen like invisiline for her teeth, she has had long work days and that triggered a migraine so she can point out reasons for it. Bascially gave a choice. Had 10 migraines in October and 8 this month it has been worse these three months. Emgality not working as well anymore having >8 migraine days a month and 15 headache days a month again.  Patient complains of symptoms per HPI as well as the following symptoms: none . Pertinent negatives and positives per HPI. All others negative  HPI:  Jennifer [name] is a 67 y.o. female here as requested by Judy Pimple, MD for second opinion on migraine. has Hypothyroidism; Migraine with aura; GERD; Osteopenia; Routine general medical examination at a health care facility; Hyperlipidemia, mild; Allergic rhinitis; Prediabetes; Colon cancer screening; Joint pain; B12 deficiency; Malignant neoplasm of upper-inner quadrant of right  breast in female, estrogen receptor positive (HCC); Family history of breast cancer; Family history of prostate cancer; Genetic testing; History of breast cancer; Common bile duct dilatation; Iron deficiency anemia; Raynaud's disease; Pain in right shoulder; Thoracic back pain; Chronic migraine without aura, with intractable migraine, so stated, with status migrainosus; Migraine without aura and without status migrainosus, not intractable; and Current use of proton pump inhibitor on their problem list.   I reviewed Dr. Royden Purl notes from April 12 from when patient was referred, she is here for second opinion on migraines, she has been following with Ortho for neck pain thought to have myofascial pain syndrome with trigger points in right scapular trap region and rhomboid, she was also sent for physical therapy for manual treatments and dry needling.  This was for cervical and thoracic back pain.  Shoulder film and CT of the head were okay.  They discussed baclofen and rhomboid stretching/TENS unit.  Dry needling helped on April 10 lasted a few hours then soreness improved and really helped.  She is doing stretches.  Prednisone tapers have helped in the past for headaches and she has been to the emergency room for headaches.  They gave her Toradol and baclofen the day before.  Has used TENS unit on and off.  She sees Dr. Neale Burly at the [facility].  She has been diagnosed with migraine with aura and not making any progress with trigger point injections, she has been in the ER frequently for treatments headache cocktails, has not had any CGRP  drugs unclear about Botox she is running out of triptans every month and she gets rebound with analgesics recently worsened by neck and back problems.  I reviewed the rest of her chart which included multiple physical therapy treatments.  She was in the emergency room March 20, 2019 for for headaches again.  She was in the emergency room again on 05/21/2023 with a  headache.  She has followed with Ortho care as well.  She was in the emergency room July 5 and October 1 as well.  She had a CT of the head July 01, 2022 which did not show anything acute and it was due to dizziness and headaches but did show paranasal sinus mucosal thickening with fluid levels representing acute sinusitis in the appropriate clinical setting.  She has also had multiple MRIs the last 1 was in 07/30/2020 which I reviewed which showed a normal MRI appearance of the brain which has been stable since 2011.  [place]" Care Everywhere" I also see multiple CTs of the head CT of the cervical spine most recently in March 2024 for increasing headache.  I do not have a headache wellness records but she has seen multiple doctors for her headaches we will request.   Patient reports she has migraines since 1994. She has pulsating/pounding/throbbing, nausea, multiple vomiting, hurts to move, she has been going to the headache wellness center since 2004, She has at least >8 migraine days a month and >15 headache days a month, she gets massages, she gets acupuncture and dry needling and myofascial release tools, she has tried a plethora of medications and other modalities. She doesn't eat dairy, processed foods, they can be triggers, she has neck pain, no aura, no medication overuse, stress, lack of sleep, smells, photophobia, phonophobia, lasted up to 72 hours moderate to severe, weather and barometric pressure, maxalt helps but she runs out. She has no associated symptoms, she gets significant fatigue as a prodrome and has a hangover the next day, no inciting event, mother had migraines, never have been hormonal, usually unilateral more on the right behind the eye, associatedmyofascial neck pain, No other focal neurologic deficits, associated symptoms, inciting events or modifiable factors.  From a thorough review of records, medications tried greater than 3 months that can be used in headache or migraine  management include: Tylenol, ibuprofen, Xanax, aspirin, atenolol, baclofen, Fioricet, Decadron, gabapentin, hydrocodone, hydroxyzine, ketorolac injections and tablets, Claritin, mag oxide p.o., mag sulfate IV, methylprednisolone, Botox since 2019, Zofran, prednisone tablets on multiple occasions, Compazine injections, promethazine injections and oral, sumatriptan, rizatriptan, tramadol, amitriptyline, nortriptyline, topiramate, zonisamide, Robaxin, Aimovig contraindicated due to constipation, cephaly,    Reviewed notes, labs and imaging from outside physicians, which showed:  CT head march 2024([place]): CT head without contrast.  CT cervical spine without contrast.   COMPARISON: None.   INDICATION: Increasing headaches. Recent posterior head trauma.   TECHNIQUE: Dose lowering technique(s) such as automated exposure control, iterative reconstruction, and mA and/or kV adjustment for patient size was utilized for this examination.   FINDINGS:   CT HEAD:   Normal overall brain volume for age.   No midline shift or hydrocephalus.   No hemorrhages or skull fractures or acute infarctions.   Visualized paranasal sinuses and mastoid air cells clear.   MRI brain 07/30/2020: CLINICAL DATA:  67 year old female with headache, dizziness. Recent intermittent episodes of right foot heaviness, dizziness and nausea. Chronic migraines, 20 years.   EXAM: MRI HEAD WITHOUT AND WITH CONTRAST   TECHNIQUE: Multiplanar, multiecho  pulse sequences of the brain and surrounding structures were obtained without and with intravenous contrast.   CONTRAST:  6mL GADAVIST GADOBUTROL 1 MMOL/ML IV SOLN   COMPARISON:  Brain MRI 05/01/2010. Head CT 09/24/2018.   FINDINGS: Brain: Cerebral volume is normal for age and has not significantly changed since 2011. No restricted diffusion to suggest acute infarction. No midline shift, mass effect, evidence of mass lesion, ventriculomegaly, extra-axial collection or  acute intracranial hemorrhage. Cervicomedullary junction and pituitary are within normal limits.   Wallace Cullens and white matter signal is within normal limits throughout the brain. No encephalomalacia or chronic cerebral blood products.   No abnormal enhancement identified. No dural thickening.   Vascular: Major intracranial vascular flow voids are stable since 2011. The major dural venous sinuses are enhancing and appear to be patent.   Skull and upper cervical spine: Normal visible cervical spine. Bone marrow signal is stable since 2011 and within normal limits.   Sinuses/Orbits: Negative orbits. A chronic left maxillary mucous retention cyst has regressed since 2011. Other paranasal sinuses remain clear.   Other: Mastoids are clear. Grossly normal visible internal auditory structures. Normal stylomastoid foramina. Scalp and face appear negative.   IMPRESSION: Normal MRI appearance of the brain, stable since 2011. No explanation for symptoms.      Latest Ref Rng & Units 10/20/2023    2:10 PM 09/20/2023    9:37 AM 08/06/2023   12:10 PM  CBC  WBC 4.0 - 10.5 K/uL 6.3  5.6  4.6   Hemoglobin 12.0 - 15.0 g/dL 40.9  81.1  91.4   Hematocrit 36.0 - 46.0 % 39.4  39.5  39.7   Platelets 150 - 400 K/uL 327  328.0  333        Latest Ref Rng & Units 10/20/2023    2:10 PM 09/17/2023    9:43 AM 08/06/2023   12:10 PM  CMP  Glucose 70 - 99 mg/dL 84  93  92   BUN 8 - 23 mg/dL 17  15  18    Creatinine 0.44 - 1.00 mg/dL 7.82  9.56  2.13   Sodium 135 - 145 mmol/L 131  137  137   Potassium 3.5 - 5.1 mmol/L 4.0  4.6  3.9   Chloride 98 - 111 mmol/L 97  98  102   CO2 22 - 32 mmol/L 26  32  29   Calcium 8.9 - 10.3 mg/dL 9.3  9.6  9.6   Total Protein 6.0 - 8.3 g/dL  7.1  7.5   Total Bilirubin 0.2 - 1.2 mg/dL  0.5  0.4   Alkaline Phos 39 - 117 U/L  106  101   AST 0 - 37 U/L  17  18   ALT 0 - 35 U/L  17  19     TSH 08/25/2022 2.53 normal  CT head 07/01/2022: CLINICAL DATA:  Dizziness,  persistent/recurrent, cardiac or vascular cause suspected   Dizziness and headache this afternoon.   EXAM: CT HEAD WITHOUT CONTRAST   TECHNIQUE: Contiguous axial images were obtained from the base of the skull through the vertex without intravenous contrast.   RADIATION DOSE REDUCTION: This exam was performed according to the departmental dose-optimization program which includes automated exposure control, adjustment of the mA and/or kV according to patient size and/or use of iterative reconstruction technique.   COMPARISON:  Head CT 06/07/2022   FINDINGS: Brain: No intracranial hemorrhage, mass effect, or midline shift. No hydrocephalus. The basilar cisterns are patent. No evidence of  territorial infarct or acute ischemia. No extra-axial or intracranial fluid collection.   Vascular: No hyperdense vessel or unexpected calcification.   Skull: No fracture or focal lesion.   Sinuses/Orbits: Mucosal thickening with small fluid level in left side of sphenoid sinus. Small left maxillary sinus fluid level. Mucosal thickening throughout ethmoid air cells. No mastoid effusion.   Other: None.   IMPRESSION: 1. No acute intracranial abnormality. 2. Paranasal sinus mucosal thickening with fluid levels, may represent acute sinusitis in the appropriate clinical setting.  Review of Systems: Patient complains of symptoms per HPI as well as the following symptoms fatigue, back pain, neck pain, headaches, anxiety. Pertinent negatives and positives per HPI. All others negative.   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   Social Determinants of Health   Financial Resource Strain: Low Risk  (09/30/2023)   Overall Financial Resource Strain (CARDIA)    Difficulty of Paying Living Expenses: Not hard at all  Food Insecurity: No Food Insecurity (09/30/2023)   Hunger Vital Sign    Worried About Running Out of Food in the Last Year: Never true    Ran Out of Food in the Last Year: Never true  Transportation Needs: No Transportation Needs (09/30/2023)   PRAPARE - Administrator, Civil Service (Medical): No    Lack of Transportation (Non-Medical): No  Physical Activity: Insufficiently Active (09/30/2023)   Exercise Vital Sign    Days of Exercise per Week: 2 days    Minutes of Exercise per Session: 20 min  Stress: No Stress Concern Present (09/30/2023)   [employer] of Occupational Health - Occupational Stress Questionnaire    Feeling of Stress : Not at all  Social Connections: Moderately Integrated (09/30/2023)   Social Connection and Isolation Panel [NHANES]    Frequency of Communication with Friends and Family: More than three times a week    Frequency of Social Gatherings with Friends and Family: Once a week    Attends Religious Services: More than 4 times per year    Active Member of [employer] or Organizations: Yes    Attends Banker Meetings: More than 4 times per year    Marital Status: Widowed  Intimate Partner Violence: Not At Risk (07/06/2023)   Humiliation, Afraid, Rape, and Kick questionnaire    Fear of Current or Ex-Partner: No    Emotionally Abused: No    Physically Abused: No    Sexually Abused: No    Family History  Problem Relation  Age of Onset   Breast cancer Mother 63   Diabetes Mother    Irritable bowel syndrome Mother    Cancer Mother    Hearing loss Mother  Hyperlipidemia Mother    Miscarriages / Stillbirths Mother    Hypertension Father    Heart disease Father    Hypertension Sister    Anuerysm Sister        head   Depression Sister    Irritable bowel syndrome Sister    Hypertension Sister    Anxiety disorder Sister    Other Sister        prediabetes   Celiac disease Brother    Diabetes Brother    Osteoporosis Maternal Grandmother    Diabetes Maternal Grandmother    Arthritis Maternal Grandmother    COPD Paternal Grandmother    Heart disease Paternal Grandfather    Breast cancer Cousin 62       pat first cousin   Breast cancer Cousin        mat second cousin with breast cancer in her 30s   Thyroid cancer Cousin 78       pat first cousin   Prostate cancer Other 56   Colon cancer Neg Hx    Stomach cancer Neg Hx    Esophageal cancer Neg Hx    Pancreatic cancer Neg Hx    Rectal cancer Neg Hx    Colon polyps Neg Hx    Crohn's disease Neg Hx    Ulcerative colitis Neg Hx     Past Medical History:  Diagnosis Date   Allergic rhinitis    Allergy    seasonal   Anemia    Anxiety    Arthritis    hands, knees   Asthma    "as a child"   Blood transfusion without reported diagnosis    "as a child"   Breast cancer (HCC) 2019   Right Breast Cancer   Cancer (HCC) 09/2018   Breast CA new DX, right breast   Cervical dysplasia    Common bile duct dilation    Diabetes mellitus without complication (HCC)    Endometriosis    Esophageal reflux    Family history of adverse reaction to anesthesia    younger sister anxiety for a dew days after   Family history of breast cancer    Family history of breast cancer    Family history of prostate cancer    Hepatic cyst 08/2020   multiple   Hepatic hemangioma 08/2020   intrahepatic hemangioma   Hiatal hernia    HSV-1 (herpes simplex virus 1)  infection    Hyperlipidemia    Hypertension    pt.denies 10/19/22   Hypothyroid    Migraine with aura, without mention of intractable migraine without mention of status migrainosus    Osteopenia 08/2019   T score -2.2 FRAX 11% / 1.7%   Personal history of radiation therapy 2019   Right Breast Cancer   Pre-diabetes    Raynaud's disease     Patient Active Problem List   Diagnosis Date Noted   Current use of proton pump inhibitor 09/12/2023   Chronic migraine without aura, with intractable migraine, so stated, with status migrainosus 07/29/2023   Migraine without aura and without status migrainosus, not intractable 07/29/2023   Thoracic back pain 01/21/2023   Pain in right shoulder 08/12/2022   Raynaud's disease 03/17/2022   Iron deficiency anemia 09/08/2020   Common bile duct dilatation 09/06/2020   History of breast cancer 03/01/2020   Genetic testing 11/14/2019   Family history of prostate cancer    Family history of breast cancer    Malignant neoplasm of upper-inner quadrant of right breast  in female, estrogen receptor positive (HCC) 09/20/2018   B12 deficiency 04/13/2018   Joint pain 04/11/2018   Colon cancer screening 09/16/2017   Prediabetes 07/28/2016   Allergic rhinitis 11/13/2014   Hyperlipidemia, mild 09/26/2014   Routine general medical examination at a health care facility 10/28/2012   GERD 09/16/2010   Hypothyroidism 05/07/2008   Osteopenia 05/07/2008   Migraine with aura 11/11/2007    Past Surgical History:  Procedure Laterality Date   ABDOMINAL HYSTERECTOMY     BIOPSY  09/12/2020   Procedure: BIOPSY;  Surgeon: Rachael Fee, MD;  Location: [facility];  Service: Endoscopy;;   BREAST BIOPSY Left 11/27/2022   MM LT BREAST BX W LOC DEV 1ST LESION IMAGE BX SPEC STEREO GUIDE 11/27/2022 [facility]   BREAST EXCISIONAL BIOPSY Bilateral over 10 years ago   benign x 2    BREAST LUMPECTOMY Right 10/10/2018   BREAST LUMPECTOMY WITH RADIOACTIVE SEED AND  SENTINEL LYMPH NODE BIOPSY Right 10/10/2018   Procedure: RIGHT BREAST LUMPECTOMY WITH RADIOACTIVE SEED AND RIGHT SENTINEL LYMPH NODE BIOPSY;  Surgeon: Chevis Pretty III, MD;  Location: [facility];  Service: General;  Laterality: Right;   BREAST SURGERY     Benign breast lump excised   CERVICAL CONE BIOPSY  1985   severe dysplasia   COLONOSCOPY  2005 and dec 2019   normal   COLPOSCOPY     endoscopy  10/2018   ESOPHAGOGASTRODUODENOSCOPY (EGD) WITH PROPOFOL N/A 09/12/2020   Procedure: ESOPHAGOGASTRODUODENOSCOPY (EGD) WITH PROPOFOL;  Surgeon: Rachael Fee, MD;  Location: [facility];  Service: Endoscopy;  Laterality: N/A;   EUS N/A 09/12/2020   Procedure: UPPER ENDOSCOPIC ULTRASOUND (EUS) RADIAL;  Surgeon: Rachael Fee, MD;  Location: [facility];  Service: Endoscopy;  Laterality: N/A;   LAPAROSCOPIC ASSISTED VAGINAL HYSTERECTOMY     LAPAROSCOPIC BILATERAL SALPINGO OOPHERECTOMY Bilateral 10/27/2019   Procedure: LAPAROSCOPIC BILATERAL SALPINGO OOPHORECTOMY WITH PERITONEAL WASHINGS;  Surgeon: Genia Del, MD;  Location: [facility] [place];  Service: Gynecology;  Laterality: Bilateral;  request 1:00pm on Friday, Dec. 4th in [place] Iqueue time held requests one hour OR time   moles removed from upper back, face lft, 1 between breasts, upper leg left inside  10/18/2019   wearing small round bandaids on for 2 weeks   ROTATOR CUFF REPAIR Bilateral 08/2008   UPPER GASTROINTESTINAL ENDOSCOPY      Current Outpatient Medications  Medication Sig Dispense Refill   ALPRAZolam (XANAX) 0.5 MG tablet TAKE 1/2 TO 1 TABLET BY MOUTH ONCE DAILY AS NEEDED FOR SLEEP/ANXIETY 15 tablet 2   ALPRAZolam (XANAX) 0.5 MG tablet Take by mouth.     atenolol (TENORMIN) 25 MG tablet Take 12.5 mg by mouth 3 (three) times daily. For migraine prevention     B Complex Vitamins (B COMPLEX PO) Take by mouth.     baclofen (LIORESAL) 10 MG tablet Take by mouth as needed.     CALCIUM  CARBONATE-VITAMIN D PO Take 1 tablet by mouth daily. 1000 mg     famotidine (PEPCID) 40 MG tablet Take 40 mg by mouth 2 (two) times daily.      hydrocortisone 2.5 % cream Apply to affected areas BID PRN as directed. 30 g 3   hydrOXYzine (ATARAX) 25 MG tablet Take 37.5 mg by mouth at bedtime as needed.     ketorolac (TORADOL) 10 MG tablet Take by mouth.     letrozole (FEMARA) 2.5 MG tablet Take 1 tablet (2.5 mg total) by mouth daily.  90 tablet 3   levocetirizine (XYZAL) 5 MG tablet Take 5 mg by mouth at bedtime.   3   levothyroxine (SYNTHROID) 75 MCG tablet TAKE 1 TABLET BY MOUTH EVERY DAY BEFORE BREAKFAST 90 tablet 0   magnesium oxide (MAG-OX) 400 MG tablet Take by mouth.     Magnesium Oxide 400 (240 Mg) MG TABS Take 400 mg by mouth daily.      metFORMIN (GLUCOPHAGE-XR) 500 MG 24 hr tablet Take 1 tablet (500 mg total) by mouth daily with breakfast. 90 tablet 3   Multiple Vitamin (MULTIVITAMIN WITH MINERALS) TABS tablet Take 1 tablet by mouth daily.     nabumetone (RELAFEN) 500 MG tablet Take 500 mg by mouth 2 (two) times daily.     neomycin-polymyxin b-dexamethasone (MAXITROL) 3.5-10000-0.1 SUSP 1 drop 3 (three) times daily.     Omega-3 Fatty Acids (FISH OIL) 1000 MG CAPS Take 1,000 mg by mouth daily.      OnabotulinumtoxinA (BOTOX IJ) Inject 1 Dose as directed every 6 (six) weeks.      ondansetron (ZOFRAN ODT) 4 MG disintegrating tablet Take 1 tablet (4 mg total) by mouth every 8 (eight) hours as needed. 20 tablet 0   pantoprazole (PROTONIX) 20 MG tablet TAKE 1 TABLET BY MOUTH EVERY DAY 90 tablet 1   PRESCRIPTION MEDICATION Inject into the skin every 6 (six) weeks. Steroid Trigger     Probiotic Product (PROBIOTIC-10 PO) Take 1 capsule by mouth daily.      promethazine (PHENERGAN) 25 MG tablet Take 25 mg by mouth daily as needed.     rizatriptan (MAXALT) 10 MG tablet Take 1 tablet (10 mg total) by mouth once as needed for up to 10 days for migraine. May repeat in 2 hours if needed 10 tablet 0    triamcinolone cream (KENALOG) 0.1 % Apply to affected areas BID PRN for up to two weeks at a time as directed. 30 g 1   TURMERIC PO Take 1 tablet by mouth daily.     Galcanezumab-gnlm (EMGALITY) 120 MG/ML SOAJ Inject 120 mg into the skin every 28 (twenty-eight) days. 1.12 mL 11   Current Facility-Administered Medications  Medication Dose Route Frequency Provider Last Rate Last Admin   Galcanezumab-gnlm SOAJ 240 mg  240 mg Subcutaneous Once         Allergies as of 10/19/2023 - Review Complete 10/19/2023  Allergen Reaction Noted   Ciprofloxacin Other (See Comments) 09/21/2018   Codeine phosphate Other (See Comments) 02/07/2007   Decongestant [pseudoephedrine] Other (See Comments) 09/21/2018   Diclofenac  12/23/2022   Diphenhydramine hcl (sleep)  04/19/2022   Flonase [fluticasone propionate] Other (See Comments) 11/13/2014   Fluticasone  04/19/2022   Hydrocodone Other (See Comments) 11/27/2008   Macrobid [nitrofurantoin]  01/22/2021   Nasal spray  04/19/2022   Nasonex [mometasone]  10/29/2021   Reglan [metoclopramide] Other (See Comments) 09/23/2018   Topamax [topiramate] Other (See Comments) 09/21/2018   Benadryl [diphenhydramine hcl] Palpitations 10/18/2017   Sulfa antibiotics Anxiety 09/05/2014    Vitals: BP 116/72   Pulse 70   Ht 4' 11.25" (1.505 m)   Wt 129 lb (58.5 kg)   BMI 25.84 kg/m  Last Weight:  Wt Readings from Last 1 Encounters:  10/20/23 127 lb 13.9 oz (58 kg)   Last Height:   Ht Readings from Last 1 Encounters:  10/20/23 4\' 11"  (1.499 m)       Assessment/Plan:  Patient with chronic migraines for years, Emgality not beneficial anymore, will try Vyepti, [employer]  not working as well anymore having >8 migraine days a month and 15 headache days a month again for > 3 months.   - Try Vyepti, start 100mg  a month and increase as tolerated to 300mg  a month if the 100mg  not effective. If copay is too high I would chnge to Ajovy or nurtec every other day. For now  until Vyepti approved or we figure it out can continue emgality and every other day nurtec - As acute can consider other triptans(amerge, frovatriptan) or ubrelvy or zavzpret or nurtec(was not approved)  - From a thorough review of records, medications tried greater than 3 months that can be used in headache or migraine management include: Tylenol, ibuprofen, Xanax, aspirin, atenolol, baclofen, Fioricet, Decadron, gabapentin, hydrocodone, hydroxyzine, ketorolac injections and tablets, Claritin, mag oxide p.o., mag sulfate IV, methylprednisolone, Botox since 2019, Zofran, prednisone tablets on multiple occasions, Compazine injections, promethazine injections and oral, sumatriptan, rizatriptan, tramadol, amitriptyline, nortriptyline, topiramate, zonisamide, Robaxin, Aimovig contraindicated due to constipation, cephaly, Emgality, Ajovy, Aimovig contraindicated due to   Nurtec  Non pharmaceutical treatments for migraines, discussed. Discussed new class of medications.   Try Vyepti, start 100mg  a month and increase as tolerated to 300mg  a month if the 100mg  not effective. If copay is too high I would chnge to Ajovy or nurtec every other day. For now until Vyepti approved or we figure it out can continue emgality and every other day nurtec  Eptinezumab Injection   No orders of the defined types were placed in this encounter.  Meds ordered this encounter  Medications   Galcanezumab-gnlm (EMGALITY) 120 MG/ML SOAJ    Sig: Inject 120 mg into the skin every 28 (twenty-eight) days.    Dispense:  1.12 mL    Refill:  11    Cc: Tower, Audrie Gallus, MD,  Tower, Audrie Gallus, MD  Naomie Dean, MD  [facility] [address] [place], [place] [address]  Phone [phone] Fax [phone]  I spent 58 minutes of face-to-face and non-face-to-face time with patient on the  1. Chronic migraine without aura, with intractable migraine, so stated, with status migrainosus    diagnosis.   This included previsit chart review, lab review, study review, order entry, electronic health record documentation, patient education on the different diagnostic and therapeutic options, counseling and coordination of care, risks and benefits of management, compliance, or risk factor reduction

## 2023-10-19 NOTE — Telephone Encounter (Signed)
-----   Message from Anson Fret sent at 10/19/2023  1:42 PM EST ----- Regarding: Start Vyepti Please start Vyepti approval for patient:    Patient with chronic migraines for years, Emgality not beneficial anymore, will try Vyepti, Emgality not working as well anymore having >8 migraine days a month and 15 headache days a month again for > 3 months.   - Try Vyepti, start 100mg  q3 month and increase as tolerated to 300mg  a month if the 100mg  not effective  - From a thorough review of records, medications tried greater than 3 months that can be used in headache or migraine management include: Tylenol, ibuprofen, Xanax, aspirin, atenolol, baclofen, Fioricet, Decadron, gabapentin, hydrocodone, hydroxyzine, ketorolac injections and tablets, Claritin, mag oxide p.o., mag sulfate IV, methylprednisolone, Botox since 2019, Zofran, prednisone tablets on multiple occasions, Compazine injections, promethazine injections and oral, sumatriptan, rizatriptan, tramadol, amitriptyline, nortriptyline, topiramate, zonisamide, Robaxin, Aimovig contraindicated due to constipation, cephaly, Emgality, Ajovy, Aimovig contraindicated due to

## 2023-10-19 NOTE — Patient Instructions (Addendum)
Try Vyepti, start 100mg  a month and increase as tolerated to 300mg  a month if the 100mg  not effective. If copay is too high I would chnge to Ajovy or nurtec every other day. For now until Vyepti approved or we figure it out can continue emgality and every other day nurtec  Eptinezumab Injection What is this medication? EPTINEZUMAB (EP ti NEZ ue mab) prevents migraines. It works by blocking a substance in the body that causes migraines. It is a monoclonal antibody. This medicine may be used for other purposes; ask your health care provider or pharmacist if you have questions. COMMON BRAND NAME(S): Vyepti What should I tell my care team before I take this medication? They need to know if you have any of these conditions: An unusual or allergic reaction to eptinezumab, other medications, foods, dyes, or preservatives Pregnant or trying to get pregnant Breast-feeding How should I use this medication? This medication is injected into a vein. It is given by your care team in a hospital or clinic setting. Talk to your care team about the use of this medication in children. Special care may be needed. Overdosage: If you think you have taken too much of this medicine contact a poison control center or emergency room at once. NOTE: This medicine is only for you. Do not share this medicine with others. What if I miss a dose? Keep appointments for follow-up doses. It is important not to miss your dose. Call your care team if you are unable to keep an appointment. What may interact with this medication? Interactions are not expected. This list may not describe all possible interactions. Give your health care provider a list of all the medicines, herbs, non-prescription drugs, or dietary supplements you use. Also tell them if you smoke, drink alcohol, or use illegal drugs. Some items may interact with your medicine. What should I watch for while using this medication? Your condition will be monitored  carefully while you are receiving this medication. What side effects may I notice from receiving this medication? Side effects that you should report to your care team as soon as possible: Allergic reactions or angioedema--skin rash, itching or hives, swelling of the face, eyes, lips, tongue, arms, or legs, trouble swallowing or breathing Side effects that usually do not require medical attention (report to your care team if they continue or are bothersome): Runny or stuffy nose This list may not describe all possible side effects. Call your doctor for medical advice about side effects. You may report side effects to FDA at 1-800-FDA-1088. Where should I keep my medication? This medication is given in a hospital or clinic. It will not be stored at home. NOTE: This sheet is a summary. It may not cover all possible information. If you have questions about this medicine, talk to your doctor, pharmacist, or health care provider.  2024 Elsevier/Gold Standard (2022-01-05 00:00:00)

## 2023-10-19 NOTE — Telephone Encounter (Signed)
Vyepti 100 mg IV q3 month order form completed and is pending Dr Trevor Mace signature before giving to [facility].

## 2023-10-20 ENCOUNTER — Emergency Department
Admission: EM | Admit: 2023-10-20 | Discharge: 2023-10-20 | Disposition: A | Discharge status: Home / Self Care | Payer: Medicare HMO | Attending: Emergency Medicine | Admitting: Emergency Medicine

## 2023-10-20 ENCOUNTER — Other Ambulatory Visit: Payer: Self-pay

## 2023-10-20 DIAGNOSIS — G43809 Other migraine, not intractable, without status migrainosus: Secondary | ICD-10-CM | POA: Diagnosis not present

## 2023-10-20 DIAGNOSIS — R42 Dizziness and giddiness: Secondary | ICD-10-CM | POA: Diagnosis not present

## 2023-10-20 DIAGNOSIS — R519 Headache, unspecified: Secondary | ICD-10-CM | POA: Diagnosis present

## 2023-10-20 LAB — CBC
HCT: 39.4 % (ref 36.0–46.0)
Hemoglobin: 13.4 g/dL (ref 12.0–15.0)
MCH: 30.7 pg (ref 26.0–34.0)
MCHC: 34 g/dL (ref 30.0–36.0)
MCV: 90.2 fL (ref 80.0–100.0)
Platelets: 327 10*3/uL (ref 150–400)
RBC: 4.37 MIL/uL (ref 3.87–5.11)
RDW: 12 % (ref 11.5–15.5)
WBC: 6.3 10*3/uL (ref 4.0–10.5)
nRBC: 0 % (ref 0.0–0.2)

## 2023-10-20 LAB — BASIC METABOLIC PANEL
Anion gap: 8 (ref 5–15)
BUN: 17 mg/dL (ref 8–23)
CO2: 26 mmol/L (ref 22–32)
Calcium: 9.3 mg/dL (ref 8.9–10.3)
Chloride: 97 mmol/L — ABNORMAL LOW (ref 98–111)
Creatinine, Ser: 0.7 mg/dL (ref 0.44–1.00)
GFR, Estimated: >60 mL/min (ref >60)
Glucose, Bld: 84 mg/dL (ref 70–99)
Potassium: 4 mmol/L (ref 3.5–5.1)
Sodium: 131 mmol/L — ABNORMAL LOW (ref 135–145)

## 2023-10-20 MED ORDER — KETOROLAC TROMETHAMINE 30 MG/ML IJ SOLN
30.0000 mg | Freq: Once | INTRAMUSCULAR | Status: AC
  Administered 2023-10-20: 30 mg via INTRAVENOUS
  Filled 2023-10-20: qty 1

## 2023-10-20 MED ORDER — SODIUM CHLORIDE 0.9 % IV BOLUS
500.0000 mL | Freq: Once | INTRAVENOUS | Status: AC
  Administered 2023-10-20: 500 mL via INTRAVENOUS

## 2023-10-20 MED ORDER — DEXAMETHASONE SODIUM PHOSPHATE 10 MG/ML IJ SOLN
8.0000 mg | Freq: Once | INTRAMUSCULAR | Status: AC
  Administered 2023-10-20: 8 mg via INTRAVENOUS
  Filled 2023-10-20: qty 1

## 2023-10-20 MED ORDER — ONDANSETRON HCL 4 MG/2ML IJ SOLN
4.0000 mg | Freq: Once | INTRAMUSCULAR | Status: AC
  Administered 2023-10-20: 4 mg via INTRAVENOUS
  Filled 2023-10-20: qty 2

## 2023-10-20 NOTE — ED Provider Notes (Addendum)
[facility] Provider Note    Event Date/Time   First MD Initiated Contact with Patient 10/20/23 1414     (approximate)   History   Headache and Dizziness   HPI  Jennifer [name] is a 67 y.o. 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  Patient reports she had a headache yesterday, took one of her triptans and did develop some dizziness afterwards, she reports this has happened in the past as well.  She continues to have a headache and he is here primarily for headache dizziness has improved.  She reports typically Decadron, fluids, Toradol, Zofran is very effective for her     Physical Exam   Triage Vital Signs: ED Triage Vitals [10/20/23 1224]  Encounter Vitals Group     BP 114/67     Systolic BP Percentile      Diastolic BP Percentile      Pulse Rate 61     Resp 18     Temp 98.1 F (36.7 C)     Temp src      SpO2 95 %     Weight 58 kg (127 lb 13.9 oz)     Height 1.499 m (4\' 11" )     Head Circumference      Peak Flow      Pain Score 2     Pain Loc      Pain Education      Exclude from Growth Chart     Most recent vital signs: Vitals:   10/20/23 1224  BP: 114/67  Pulse: 61  Resp: 18  Temp: 98.1 F (36.7 C)  SpO2: 95%     General: Awake, no distress.  CV:  Good peripheral perfusion.  Resp:  Normal effort.  Abd:  No distention.  Other:  No neurodeficits   ED Results / Procedures / Treatments   Labs (all labs ordered are listed, but only abnormal results are displayed) Labs Reviewed  BASIC METABOLIC PANEL - Abnormal; Notable for the following components:      Result Value   Sodium 131 (*)    Chloride 97 (*)    All other components within normal limits  CBC     EKG  ED ECG REPORT I, Jene Every, the attending physician, personally viewed and interpreted this ECG.   Rhythm: normal sinus rhythm QRS Axis: normal Intervals: normal ST/T Wave abnormalities:  normal Narrative Interpretation: no evidence of acute ischemia    RADIOLOGY     PROCEDURES:  Critical Care performed:   Procedures   MEDICATIONS ORDERED IN ED: Medications  dexamethasone (DECADRON) injection 8 mg (8 mg Intravenous Given 10/20/23 1422)  ondansetron (ZOFRAN) injection 4 mg (4 mg Intravenous Given 10/20/23 1424)  sodium chloride 0.9 % bolus 500 mL (500 mLs Intravenous New Bag/Given 10/20/23 1420)  ketorolac (TORADOL) 30 MG/ML injection 30 mg (30 mg Intravenous Given 10/20/23 1423)     IMPRESSION / MDM / ASSESSMENT AND PLAN / ED COURSE  I reviewed the triage vital signs and the nursing notes. Patient's presentation is most consistent with severe exacerbation of chronic illness.  Patient with long history of migraine headaches, reviewed recent neurology note from yesterday with Dr. Daisy Blossom of neurology  Patient overall well-appearing with reassuring neurologic exam.  Will treat with migraine cocktail, including IV Toradol, IV Decadron, IV Zofran, IV fluids .  ----------------------------------------- 2:59 PM on 10/20/2023 ----------------------------------------- Patient reports she is completely recovered and has no  pain and would like to be discharged home      FINAL CLINICAL IMPRESSION(S) / ED DIAGNOSES   Final diagnoses:  Other migraine without status migrainosus, not intractable     Rx / DC Orders   ED Discharge Orders     None        Note:  This document was prepared using Dragon voice recognition software and may include unintentional dictation errors.   Jene Every, MD 10/20/23 1448    Jene Every, MD 10/20/23 1459

## 2023-10-20 NOTE — ED Triage Notes (Signed)
Pt to ED for migraine for a few days. Reports dizziness started last night after taking sumatriptan for migraine. Reports this medication has made her feel dizzy before.

## 2023-10-20 NOTE — ED Notes (Addendum)
Pt declines labs, agreeable to ekg. States usually just gets a migraine cocktail and feels like that's what she needs

## 2023-10-20 NOTE — Telephone Encounter (Addendum)
Order signed by Dr Lucia Gaskins then it was given to [facility].

## 2023-10-26 DIAGNOSIS — D2272 Melanocytic nevi of left lower limb, including hip: Secondary | ICD-10-CM | POA: Diagnosis not present

## 2023-10-26 DIAGNOSIS — D2262 Melanocytic nevi of left upper limb, including shoulder: Secondary | ICD-10-CM | POA: Diagnosis not present

## 2023-10-26 DIAGNOSIS — L578 Other skin changes due to chronic exposure to nonionizing radiation: Secondary | ICD-10-CM | POA: Diagnosis not present

## 2023-10-26 DIAGNOSIS — L821 Other seborrheic keratosis: Secondary | ICD-10-CM | POA: Diagnosis not present

## 2023-10-26 DIAGNOSIS — D2261 Melanocytic nevi of right upper limb, including shoulder: Secondary | ICD-10-CM | POA: Diagnosis not present

## 2023-10-26 DIAGNOSIS — D2271 Melanocytic nevi of right lower limb, including hip: Secondary | ICD-10-CM | POA: Diagnosis not present

## 2023-10-26 DIAGNOSIS — D225 Melanocytic nevi of trunk: Secondary | ICD-10-CM | POA: Diagnosis not present

## 2023-10-26 DIAGNOSIS — L2089 Other atopic dermatitis: Secondary | ICD-10-CM | POA: Diagnosis not present

## 2023-10-29 ENCOUNTER — Other Ambulatory Visit: Payer: Self-pay | Admitting: Family Medicine

## 2023-10-29 ENCOUNTER — Encounter: Payer: Self-pay | Admitting: Family Medicine

## 2023-10-29 MED ORDER — ALPRAZOLAM 0.5 MG PO TABS: ORAL_TABLET | ORAL | 2 refills | Status: DC

## 2023-10-29 NOTE — Telephone Encounter (Signed)
Name of Medication: Xanax Name of Pharmacy: [facility] Last Fill or Written Date and Quantity: 07/23/23 #15 tabs/ 2 refill  Last Office Visit and Type: CPE 10/01/23 Next Office Visit and Type: none scheduled

## 2023-10-29 NOTE — Telephone Encounter (Signed)
Prescription Request  10/29/2023  LOV: 10/01/2023  What is the name of the medication or equipment? ALPRAZolam (XANAX) 0.5 MG tablet   Have you contacted your pharmacy to request a refill? No   Which pharmacy would you like this sent to?  [id number], [place] - [address] [place] [address] Phone: [phone] Fax: [phone]     Patient notified that their request is being sent to the clinical staff for review and that they should receive a response within 2 business days.   Please advise at Mobile [phone] (mobile)

## 2023-11-04 ENCOUNTER — Telehealth: Payer: Self-pay | Admitting: Neurology

## 2023-11-04 NOTE — Telephone Encounter (Signed)
Pt called wanting to know if she can get Prednisone Tapers done here due to her insurance changing and not being able to go to where she gets them done now. Please advise.

## 2023-11-08 ENCOUNTER — Ambulatory Visit (HOSPITAL_BASED_OUTPATIENT_CLINIC_OR_DEPARTMENT_OTHER)
Admission: RE | Admit: 2023-11-08 | Discharge: 2023-11-08 | Disposition: A | Discharge status: Home / Self Care | Payer: Medicare HMO | Source: Ambulatory Visit | Attending: Obstetrics & Gynecology | Admitting: Obstetrics & Gynecology

## 2023-11-08 DIAGNOSIS — M810 Age-related osteoporosis without current pathological fracture: Secondary | ICD-10-CM | POA: Insufficient documentation

## 2023-11-08 DIAGNOSIS — Z8262 Family history of osteoporosis: Secondary | ICD-10-CM | POA: Insufficient documentation

## 2023-11-08 DIAGNOSIS — Z13820 Encounter for screening for osteoporosis: Secondary | ICD-10-CM | POA: Insufficient documentation

## 2023-11-08 DIAGNOSIS — Z780 Asymptomatic menopausal state: Secondary | ICD-10-CM | POA: Insufficient documentation

## 2023-11-08 DIAGNOSIS — Z853 Personal history of malignant neoplasm of breast: Secondary | ICD-10-CM | POA: Insufficient documentation

## 2023-11-08 DIAGNOSIS — M8580 Other specified disorders of bone density and structure, unspecified site: Secondary | ICD-10-CM | POA: Insufficient documentation

## 2023-11-08 NOTE — Telephone Encounter (Signed)
I spoke with the patient.  By prednisone tapers, she is referring to the steroid trigger point injection she has been doing on a routine basis, 6 weeks after Botox, at Dr. Onnie Boer office. I did advise her that Dr Lucia Gaskins does not do scheduled steroid trigger point injections. She will be switching to [employer] insurance in January and she found out that Dr. Onnie Boer office does not take it.  She is aware Vyepti is currently pending with [facility]/insurance.  I told her I was unsure if she would be able to be infused before the end of the year due to insurance authorization status which I will find out.  She was very appreciative for the call.  I asked her to provide [place] with her new insurance information as soon as she gets it. She is concerned she may need an appt with Dr Lucia Gaskins to discuss treatment plan since she won't be able to see Dr Neale Burly anymore. Her next appt is currently on 03/15/24.

## 2023-11-08 NOTE — Telephone Encounter (Signed)
[facility] states info was submitted on 12/12.

## 2023-11-09 ENCOUNTER — Telehealth: Payer: Self-pay | Admitting: Neurology

## 2023-11-09 ENCOUNTER — Encounter: Payer: Medicare HMO | Admitting: Dermatology

## 2023-11-09 DIAGNOSIS — G518 Other disorders of facial nerve: Secondary | ICD-10-CM | POA: Diagnosis not present

## 2023-11-09 DIAGNOSIS — M542 Cervicalgia: Secondary | ICD-10-CM | POA: Diagnosis not present

## 2023-11-09 DIAGNOSIS — M791 Myalgia, unspecified site: Secondary | ICD-10-CM | POA: Diagnosis not present

## 2023-11-09 DIAGNOSIS — G43719 Chronic migraine without aura, intractable, without status migrainosus: Secondary | ICD-10-CM | POA: Diagnosis not present

## 2023-11-09 NOTE — Telephone Encounter (Signed)
  Santamassino, Michele44 minutes ago (10:42 AM)   MS Toma Copier - I added the patients [employer] but as of 11/24/23 she will be switching to [employer], She wanted to speak to you because she spoke to insurance company and wanted to relay some information to  you so she could get in by end of year.

## 2023-11-09 NOTE — Telephone Encounter (Signed)
Merged with other phone note

## 2023-11-09 NOTE — Telephone Encounter (Signed)
Jennifer [name] - I added the patients [employer] but as of 11/24/23 she will be switching to [facility], She wanted to speak to you because she spoke to insurance company and wanted to relay some information to  you so she could get in by end of year.

## 2023-11-10 ENCOUNTER — Ambulatory Visit: Payer: Medicare HMO | Admitting: Family Medicine

## 2023-11-10 ENCOUNTER — Encounter: Payer: Self-pay | Admitting: Family Medicine

## 2023-11-10 ENCOUNTER — Encounter: Payer: Medicare HMO | Admitting: Dermatology

## 2023-11-10 VITALS — BP 130/82 | HR 67 | Temp 97.4°F | Ht 59.0 in | Wt 130.2 lb

## 2023-11-10 DIAGNOSIS — M810 Age-related osteoporosis without current pathological fracture: Secondary | ICD-10-CM

## 2023-11-10 DIAGNOSIS — H6121 Impacted cerumen, right ear: Secondary | ICD-10-CM | POA: Diagnosis not present

## 2023-11-10 DIAGNOSIS — H6120 Impacted cerumen, unspecified ear: Secondary | ICD-10-CM | POA: Insufficient documentation

## 2023-11-10 NOTE — Telephone Encounter (Signed)
Per [facility], auth form submitted 12/16.   I returned the call to the patient. She will give [place] the [employer] information when she has it. She said she had spoken to someone there and made notes but she doesn't have them with her. I let her know the Berkley Harvey is currently pending with [employer] but due to the time it takes to hear back from insurance, schedule, and order medication, it may not be feasible to infuse within the next 2 weeks. She understood and thanked me for the call.

## 2023-11-10 NOTE — Assessment & Plan Note (Signed)
Dexa this month  T score -2.6 in LFN Discussed fall prevention, supplements and exercise for bone density  Working with onc re: treatment plan

## 2023-11-10 NOTE — Progress Notes (Signed)
Subjective:    Patient ID: Jennifer [name], female    DOB: [birth year], 67 y.o.   MRN: [id number]  HPI  Wt Readings from Last 3 Encounters:  11/10/23 130 lb 4 oz (59.1 kg)  10/20/23 127 lb 13.9 oz (58 kg)  10/19/23 129 lb (58.5 kg)   26.31 kg/m  Vitals:   11/10/23 1203  BP: 130/82  Pulse: 67  Temp: (!) 97.4 F (36.3 C)  SpO2: 99%    Pt presents with c/o right ear pain  Also new OSTEOPOROSIS   On Monday was working outdoor in the cold  Right ear started hurting when she came in the house  Then pain came on and off  Not hurting today - just some pressure   No ST No nasal symptoms other than am stuffiness that is normal for her    Was dx with osteoporosis  Unsure what will treat with / cannot take alendronate  DEXA 10/2023 with T score -2.6 in LFN On letrozole for breast cancer  This is stressful Changing insurances  A lot going on    Patient Active Problem List   Diagnosis Date Noted   Cerumen impaction 11/10/2023   Current use of proton pump inhibitor 09/12/2023   Chronic migraine without aura, with intractable migraine, so stated, with status migrainosus 07/29/2023   Migraine without aura and without status migrainosus, not intractable 07/29/2023   Thoracic back pain 01/21/2023   Pain in right shoulder 08/12/2022   Raynaud's disease 03/17/2022   Iron deficiency anemia 09/08/2020   Common bile duct dilatation 09/06/2020   History of breast cancer 03/01/2020   Genetic testing 11/14/2019   Family history of prostate cancer    Family history of breast cancer    Malignant neoplasm of upper-inner quadrant of right breast in female, estrogen receptor positive (HCC) 09/20/2018   B12 deficiency 04/13/2018   Joint pain 04/11/2018   Colon cancer screening 09/16/2017   Prediabetes 07/28/2016   Allergic rhinitis 11/13/2014   Hyperlipidemia, mild 09/26/2014   Routine general medical examination at a health care facility 10/28/2012   GERD 09/16/2010   Hypothyroidism  05/07/2008   Osteoporosis 05/07/2008   Migraine with aura 11/11/2007   Past Medical History:  Diagnosis Date   Allergic rhinitis    Allergy    seasonal   Anemia    Anxiety    Arthritis    hands, knees   Asthma    "as a child"   Blood transfusion without reported diagnosis    "as a child"   Breast cancer (HCC) 2019   Right Breast Cancer   Cancer (HCC) 09/2018   Breast CA new DX, right breast   Cervical dysplasia    Common bile duct dilation    Diabetes mellitus without complication (HCC)    Endometriosis    Esophageal reflux    Family history of adverse reaction to anesthesia    younger sister anxiety for a dew days after   Family history of breast cancer    Family history of breast cancer    Family history of prostate cancer    Hepatic cyst 08/2020   multiple   Hepatic hemangioma 08/2020   intrahepatic hemangioma   Hiatal hernia    HSV-1 (herpes simplex virus 1) infection    Hyperlipidemia    Hypertension    pt.denies 10/19/22   Hypothyroid    Migraine with aura, without mention of intractable migraine without mention of status migrainosus    Osteopenia 08/2019  T score -2.2 FRAX 11% / 1.7%   Osteoporosis 05/07/2008   Qualifier: Diagnosis of  By: Milinda Antis MD, Colon Flattery    Personal history of radiation therapy 2019   Right Breast Cancer   Pre-diabetes    Raynaud's disease    Past Surgical History:  Procedure Laterality Date   ABDOMINAL HYSTERECTOMY     BIOPSY  09/12/2020   Procedure: BIOPSY;  Surgeon: Rachael Fee, MD;  Location: [facility];  Service: Endoscopy;;   BREAST BIOPSY Left 11/27/2022   MM LT BREAST BX W LOC DEV 1ST LESION IMAGE BX SPEC STEREO GUIDE 11/27/2022 [facility]   BREAST EXCISIONAL BIOPSY Bilateral over 10 years ago   benign x 2    BREAST LUMPECTOMY Right 10/10/2018   BREAST LUMPECTOMY WITH RADIOACTIVE SEED AND SENTINEL LYMPH NODE BIOPSY Right 10/10/2018   Procedure: RIGHT BREAST LUMPECTOMY WITH RADIOACTIVE SEED AND RIGHT  SENTINEL LYMPH NODE BIOPSY;  Surgeon: Griselda Miner, MD;  Location: [facility];  Service: General;  Laterality: Right;   BREAST SURGERY     Benign breast lump excised   CERVICAL CONE BIOPSY  1985   severe dysplasia   COLONOSCOPY  2005 and dec 2019   normal   COLPOSCOPY     endoscopy  10/2018   ESOPHAGOGASTRODUODENOSCOPY (EGD) WITH PROPOFOL N/A 09/12/2020   Procedure: ESOPHAGOGASTRODUODENOSCOPY (EGD) WITH PROPOFOL;  Surgeon: Rachael Fee, MD;  Location: [facility];  Service: Endoscopy;  Laterality: N/A;   EUS N/A 09/12/2020   Procedure: UPPER ENDOSCOPIC ULTRASOUND (EUS) RADIAL;  Surgeon: Rachael Fee, MD;  Location: [facility];  Service: Endoscopy;  Laterality: N/A;   LAPAROSCOPIC ASSISTED VAGINAL HYSTERECTOMY     LAPAROSCOPIC BILATERAL SALPINGO OOPHERECTOMY Bilateral 10/27/2019   Procedure: LAPAROSCOPIC BILATERAL SALPINGO OOPHORECTOMY WITH PERITONEAL WASHINGS;  Surgeon: Genia Del, MD;  Location: [facility];  Service: Gynecology;  Laterality: Bilateral;  request 1:00pm on Friday, Dec. 4th in [place] Iqueue time held requests one hour OR time   moles removed from upper back, face lft, 1 between breasts, upper leg left inside  10/18/2019   wearing small round bandaids on for 2 weeks   ROTATOR CUFF REPAIR Bilateral 08/2008   UPPER GASTROINTESTINAL ENDOSCOPY     Social History   Tobacco Use   Smoking status: Never    Passive exposure: Never   Smokeless tobacco: Never  Vaping Use   Vaping status: Never Used  Substance Use Topics   Alcohol use: No   Drug use: No   Family History  Problem Relation Age of Onset   Breast cancer Mother 70   Diabetes Mother    Irritable bowel syndrome Mother    Cancer Mother    Hearing loss Mother    Hyperlipidemia Mother    Miscarriages / Stillbirths Mother    Hypertension Father    Heart disease Father    Hypertension Sister    Anuerysm Sister        head   Depression Sister     Irritable bowel syndrome Sister    Hypertension Sister    Anxiety disorder Sister    Other Sister        prediabetes   Celiac disease Brother    Diabetes Brother    Osteoporosis Maternal Grandmother    Diabetes Maternal Grandmother    Arthritis Maternal Grandmother    COPD Paternal Grandmother    Heart disease Paternal Grandfather    Breast cancer Cousin 105  pat first cousin   Breast cancer Cousin        mat second cousin with breast cancer in her 30s   Thyroid cancer Cousin 4       pat first cousin   Prostate cancer Other 4   Colon cancer Neg Hx    Stomach cancer Neg Hx    Esophageal cancer Neg Hx    Pancreatic cancer Neg Hx    Rectal cancer Neg Hx    Colon polyps Neg Hx    Crohn's disease Neg Hx    Ulcerative colitis Neg Hx    Allergies  Allergen Reactions   Ciprofloxacin Other (See Comments)    Dizziness    Codeine Phosphate Other (See Comments)    Dizziness, heart palpitations, nervous   Decongestant [Pseudoephedrine] Other (See Comments)    dizziness   Diclofenac    Diphenhydramine Hcl (Sleep)    Flonase [Fluticasone Propionate] Other (See Comments)    Worsens her migraine    Fluticasone    Hydrocodone Other (See Comments)    Made nervous--10/09 surgery rotator cuff   Macrobid [Nitrofurantoin]     headaches   Nasal Spray    Nasonex [Mometasone]     Migraines   Reglan [Metoclopramide] Other (See Comments)    Dizziness   Topamax [Topiramate] Other (See Comments)    Dizziness    Benadryl [Diphenhydramine Hcl] Palpitations   Sulfa Antibiotics Anxiety   Current Outpatient Medications on File Prior to Visit  Medication Sig Dispense Refill   ALPRAZolam (XANAX) 0.5 MG tablet TAKE 1/2 TO 1 TABLET BY MOUTH ONCE DAILY AS NEEDED FOR SLEEP/ANXIETY 15 tablet 2   atenolol (TENORMIN) 25 MG tablet Take 12.5 mg by mouth 3 (three) times daily. For migraine prevention     B Complex Vitamins (B COMPLEX PO) Take by mouth.     baclofen (LIORESAL) 10 MG tablet Take  by mouth as needed.     CALCIUM CARBONATE-VITAMIN D PO Take 1 tablet by mouth daily. 1000 mg     famotidine (PEPCID) 40 MG tablet Take 40 mg by mouth 2 (two) times daily.      Galcanezumab-gnlm (EMGALITY) 120 MG/ML SOAJ Inject 120 mg into the skin every 28 (twenty-eight) days. 1.12 mL 11   hydrocortisone 2.5 % cream Apply to affected areas BID PRN as directed. 30 g 3   hydrOXYzine (ATARAX) 25 MG tablet Take 37.5 mg by mouth at bedtime as needed.     ketorolac (TORADOL) 10 MG tablet Take by mouth.     letrozole (FEMARA) 2.5 MG tablet Take 1 tablet (2.5 mg total) by mouth daily. 90 tablet 3   levocetirizine (XYZAL) 5 MG tablet Take 5 mg by mouth at bedtime.   3   levothyroxine (SYNTHROID) 75 MCG tablet TAKE 1 TABLET BY MOUTH EVERY DAY BEFORE BREAKFAST 90 tablet 0   magnesium oxide (MAG-OX) 400 MG tablet Take by mouth.     Magnesium Oxide 400 (240 Mg) MG TABS Take 400 mg by mouth daily.      metFORMIN (GLUCOPHAGE-XR) 500 MG 24 hr tablet Take 1 tablet (500 mg total) by mouth daily with breakfast. 90 tablet 3   Multiple Vitamin (MULTIVITAMIN WITH MINERALS) TABS tablet Take 1 tablet by mouth daily.     nabumetone (RELAFEN) 500 MG tablet Take 500 mg by mouth 2 (two) times daily.     neomycin-polymyxin b-dexamethasone (MAXITROL) 3.5-10000-0.1 SUSP 1 drop 3 (three) times daily.     Omega-3 Fatty Acids (FISH OIL) 1000 MG  CAPS Take 1,000 mg by mouth daily.      OnabotulinumtoxinA (BOTOX IJ) Inject 1 Dose as directed every 6 (six) weeks.      ondansetron (ZOFRAN ODT) 4 MG disintegrating tablet Take 1 tablet (4 mg total) by mouth every 8 (eight) hours as needed. 20 tablet 0   pantoprazole (PROTONIX) 20 MG tablet TAKE 1 TABLET BY MOUTH EVERY DAY 90 tablet 1   PRESCRIPTION MEDICATION Inject into the skin every 6 (six) weeks. Steroid Trigger     Probiotic Product (PROBIOTIC-10 PO) Take 1 capsule by mouth daily.      promethazine (PHENERGAN) 25 MG tablet Take 25 mg by mouth daily as needed.     rizatriptan  (MAXALT) 10 MG tablet Take 1 tablet (10 mg total) by mouth once as needed for up to 10 days for migraine. May repeat in 2 hours if needed 10 tablet 0   triamcinolone cream (KENALOG) 0.1 % Apply to affected areas BID PRN for up to two weeks at a time as directed. 30 g 1   TURMERIC PO Take 1 tablet by mouth daily.     Current Facility-Administered Medications on File Prior to Visit  Medication Dose Route Frequency Provider Last Rate Last Admin   Galcanezumab-gnlm SOAJ 240 mg  240 mg Subcutaneous Once         Review of Systems  Constitutional:  Negative for activity change, appetite change, fatigue, fever and unexpected weight change.  HENT:  Positive for ear pain and hearing loss. Negative for congestion, ear discharge, rhinorrhea, sinus pressure and sore throat.        Ear fullness and decreased hearing on R  Eyes:  Negative for pain, redness and visual disturbance.  Respiratory:  Negative for cough, shortness of breath and wheezing.   Cardiovascular:  Negative for chest pain and palpitations.  Gastrointestinal:  Negative for abdominal pain, blood in stool, constipation and diarrhea.  Endocrine: Negative for polydipsia and polyuria.  Genitourinary:  Negative for dysuria, frequency and urgency.  Musculoskeletal:  Negative for arthralgias, back pain and myalgias.  Skin:  Negative for pallor and rash.  Allergic/Immunologic: Negative for environmental allergies.  Neurological:  Positive for headaches. Negative for dizziness and syncope.  Hematological:  Negative for adenopathy. Does not bruise/bleed easily.  Psychiatric/Behavioral:  Negative for decreased concentration and dysphoric mood. The patient is not nervous/anxious.        Very stressed by multiple things this month       Objective:   Physical Exam Constitutional:      General: She is not in acute distress.    Appearance: Normal appearance. She is normal weight. She is not ill-appearing or diaphoretic.  HENT:     Head:  Normocephalic and atraumatic.     Right Ear: There is impacted cerumen.     Left Ear: Tympanic membrane, ear canal and external ear normal. There is no impacted cerumen.     Ears:     Comments: After consent obtained Procedure: Cerumen Disimpaction  Warm water was applied and gentle ear lavage performed on right ear.    There were no complications and following the disimpaction the tympanic membrane were visible on the bilateral. Tympanic membranes are intact following the procedure.  Auditory canals are normal.  The patient reported relief of symptoms after removal of cerumen.       Mouth/Throat:     Mouth: Mucous membranes are moist.  Eyes:     General:        Right  eye: No discharge.        Left eye: No discharge.     Pupils: Pupils are equal, round, and reactive to light.  Cardiovascular:     Rate and Rhythm: Normal rate and regular rhythm.     Heart sounds: Normal heart sounds.  Pulmonary:     Effort: Pulmonary effort is normal. No respiratory distress.  Musculoskeletal:     Cervical back: Neck supple.  Lymphadenopathy:     Cervical: No cervical adenopathy.  Neurological:     Mental Status: She is alert.     Cranial Nerves: No cranial nerve deficit.  Psychiatric:        Mood and Affect: Mood normal.     Comments: Seems stressed Candidly discusses symptoms and stressors   Pleasant            Assessment & Plan:   Problem List Items Addressed This Visit       Nervous and Auditory   Cerumen impaction - Primary   Right sided / recurrent  Had pain on/off and now just fullness Resolved after simple ear irrigation today/pt tolerated well  Call back and Er precautions noted in detail today  (ear pain / hearing change)   Discussed use of debrox on a schedule to keep in control  May need periodic irrigations          Musculoskeletal and Integument   Osteoporosis   Dexa this month  T score -2.6 in LFN Discussed fall prevention, supplements and exercise for  bone density  Working with onc re: treatment plan

## 2023-11-10 NOTE — Patient Instructions (Signed)
Your right ear looks better after irrigation   You can use debrox or a similar product twice monthly twice monthly to keep wax soft   Both ears is fine   If pain returns or if any new symptoms let us know  Hearing should continue to improve   Avoid q tips if you can

## 2023-11-10 NOTE — Assessment & Plan Note (Signed)
Right sided / recurrent  Had pain on/off and now just fullness Resolved after simple ear irrigation today/pt tolerated well  Call back and Er precautions noted in detail today  (ear pain / hearing change)   Discussed use of debrox on a schedule to keep in control  May need periodic irrigations

## 2023-11-11 ENCOUNTER — Other Ambulatory Visit: Payer: Self-pay

## 2023-11-11 DIAGNOSIS — G43009 Migraine without aura, not intractable, without status migrainosus: Secondary | ICD-10-CM | POA: Diagnosis not present

## 2023-11-11 DIAGNOSIS — G43909 Migraine, unspecified, not intractable, without status migrainosus: Secondary | ICD-10-CM | POA: Diagnosis present

## 2023-11-11 NOTE — ED Triage Notes (Signed)
Pt presents to ER with c/o migraine that started at 1900 tonight.  Pt reports she took her rizatriptan, but her sx have become worse since then.  Pt reports some blurry vision, and dizziness, and sensitivity to light and sound at this time.  Pt is otherwise A&O x4 at this time and in NAD.

## 2023-11-12 ENCOUNTER — Emergency Department
Admission: EM | Admit: 2023-11-12 | Discharge: 2023-11-12 | Disposition: A | Discharge status: Home / Self Care | Payer: Medicare HMO | Attending: Emergency Medicine | Admitting: Emergency Medicine

## 2023-11-12 DIAGNOSIS — G43009 Migraine without aura, not intractable, without status migrainosus: Secondary | ICD-10-CM

## 2023-11-12 MED ORDER — KETOROLAC TROMETHAMINE 15 MG/ML IJ SOLN
15.0000 mg | Freq: Once | INTRAMUSCULAR | Status: AC
  Administered 2023-11-12: 15 mg via INTRAVENOUS
  Filled 2023-11-12: qty 1

## 2023-11-12 MED ORDER — SODIUM CHLORIDE 0.9 % IV BOLUS
500.0000 mL | Freq: Once | INTRAVENOUS | Status: AC
  Administered 2023-11-12: 500 mL via INTRAVENOUS

## 2023-11-12 MED ORDER — METHYLPREDNISOLONE SODIUM SUCC 125 MG IJ SOLR
125.0000 mg | Freq: Once | INTRAMUSCULAR | Status: AC
  Administered 2023-11-12: 125 mg via INTRAVENOUS
  Filled 2023-11-12: qty 2

## 2023-11-12 NOTE — ED Provider Notes (Signed)
[facility] Provider Note    Event Date/Time   First MD Initiated Contact with Patient 11/12/23 [phone]     (approximate)   History   Migraine   HPI  Jennifer [name] is a 67 y.o. female   Past medical history of longstanding migraine headaches who presents emergency department with migraine headache.  Typical migraine headache with aura that typically responds with migraine treatment at home but this had lingered longer and so she sought treatment in the emergency department with her typical migraine cocktail which consists of Solu-Medrol, fluids, Toradol.  Denies trauma.  No neurologic symptoms like visual changes motor or sensory deficits.  Gradual onset.   External Medical Documents Reviewed: Emergency department note from 10/19/2021 for for migraine headache, migraine cocktail and was discharged after resolution of symptoms      Physical Exam   Triage Vital Signs: ED Triage Vitals [11/11/23 2327]  Encounter Vitals Group     BP (!) 143/74     Systolic BP Percentile      Diastolic BP Percentile      Pulse Rate 66     Resp 18     Temp 97.8 F (36.6 C)     Temp Source Oral     SpO2 100 %     Weight      Height      Head Circumference      Peak Flow      Pain Score 9     Pain Loc      Pain Education      Exclude from Growth Chart     Most recent vital signs: Vitals:   11/12/23 0338 11/12/23 0357  BP: 130/65 (!) 140/67  Pulse: 65 63  Resp: 18 15  Temp:  98.1 F (36.7 C)  SpO2: 97% 99%    General: Awake, no distress.  CV:  Good peripheral perfusion.  Resp:  Normal effort.  Abd:  No distention.  Other:  Awake alert pleasant woman in no acute distress.  Neck supple full range of motion.  No temporal tenderness to palpation bilaterally.  Extraocular movements intact no facial asymmetry and motor sensor intact.  Gait is normal.  No signs of trauma.   ED Results / Procedures / Treatments   Labs (all labs ordered are listed, but  only abnormal results are displayed) Labs Reviewed - No data to display  PROCEDURES:  Critical Care performed: No  Procedures   MEDICATIONS ORDERED IN ED: Medications  sodium chloride 0.9 % bolus 500 mL (0 mLs Intravenous Stopped 11/12/23 0431)  ketorolac (TORADOL) 15 MG/ML injection 15 mg (15 mg Intravenous Given 11/12/23 0355)  methylPREDNISolone sodium succinate (SOLU-MEDROL) 125 mg/2 mL injection 125 mg (125 mg Intravenous Given 11/12/23 0353)    IMPRESSION / MDM / ASSESSMENT AND PLAN / ED COURSE  I reviewed the triage vital signs and the nursing notes.                                Patient's presentation is most consistent with acute presentation with potential threat to life or bodily function.  Differential diagnosis includes, but is not limited to, migraine headache, considered but less likely ICH or CVA or vasculitis    MDM: Longstanding history of migraines with a typical migraine headache that responded to migraine cocktail.  No trauma no neurologic symptoms no temporal tenderness to suggest more emergent pathologies like ICH CVA  vasculitis.  With resolution of symptoms patient is eager to go home, discharge.      FINAL CLINICAL IMPRESSION(S) / ED DIAGNOSES   Final diagnoses:  Migraine without aura and without status migrainosus, not intractable     Rx / DC Orders   ED Discharge Orders     None        Note:  This document was prepared using Dragon voice recognition software and may include unintentional dictation errors.    Pilar Jarvis, MD 11/12/23 [id number]

## 2023-11-12 NOTE — ED Notes (Signed)
AVS provided by edp was discussed with pt. Pt verbalized understanding with no additional questions at this time.

## 2023-11-15 ENCOUNTER — Telehealth: Payer: Self-pay | Admitting: Neurology

## 2023-11-15 ENCOUNTER — Other Ambulatory Visit: Payer: Self-pay | Admitting: Gastroenterology

## 2023-11-15 NOTE — Telephone Encounter (Signed)
There is already an order pending auth with insurance.

## 2023-11-15 NOTE — Telephone Encounter (Signed)
Pt states she was told to call and request that Dr Lucia Gaskins put in an order for her to begin her infusion therapy.

## 2023-11-28 ENCOUNTER — Encounter (HOSPITAL_BASED_OUTPATIENT_CLINIC_OR_DEPARTMENT_OTHER): Payer: Self-pay | Admitting: Emergency Medicine

## 2023-11-28 ENCOUNTER — Emergency Department (HOSPITAL_BASED_OUTPATIENT_CLINIC_OR_DEPARTMENT_OTHER)
Admission: EM | Admit: 2023-11-28 | Discharge: 2023-11-28 | Disposition: A | Discharge status: Home / Self Care | Payer: HMO | Attending: Emergency Medicine | Admitting: Emergency Medicine

## 2023-11-28 ENCOUNTER — Encounter: Payer: Self-pay | Admitting: Hematology and Oncology

## 2023-11-28 DIAGNOSIS — G43809 Other migraine, not intractable, without status migrainosus: Secondary | ICD-10-CM | POA: Diagnosis not present

## 2023-11-28 DIAGNOSIS — G43909 Migraine, unspecified, not intractable, without status migrainosus: Secondary | ICD-10-CM | POA: Diagnosis present

## 2023-11-28 MED ORDER — DEXAMETHASONE SODIUM PHOSPHATE 10 MG/ML IJ SOLN
10.0000 mg | Freq: Once | INTRAMUSCULAR | Status: AC
  Administered 2023-11-28: 10 mg via INTRAVENOUS
  Filled 2023-11-28: qty 1

## 2023-11-28 MED ORDER — KETOROLAC TROMETHAMINE 30 MG/ML IJ SOLN
30.0000 mg | Freq: Once | INTRAMUSCULAR | Status: AC
  Administered 2023-11-28: 30 mg via INTRAVENOUS
  Filled 2023-11-28: qty 1

## 2023-11-28 MED ORDER — SODIUM CHLORIDE 0.9 % IV BOLUS
500.0000 mL | Freq: Once | INTRAVENOUS | Status: AC
  Administered 2023-11-28: 500 mL via INTRAVENOUS

## 2023-11-28 MED ORDER — ONDANSETRON 4 MG PO TBDP
4.0000 mg | ORAL_TABLET | Freq: Once | ORAL | Status: AC
  Administered 2023-11-28: 4 mg via ORAL
  Filled 2023-11-28: qty 1

## 2023-11-28 NOTE — ED Provider Notes (Signed)
 [place] EMERGENCY DEPARTMENT AT [facility] Provider Note   CSN: [id number] Arrival date & time: 11/28/23  1139     History  Chief Complaint  Patient presents with   Migraine    Jennifer [name] is a 68 y.o. female history of chronic migraine, followed by Dr. Ines here for evaluation of migraine.  She states she has longstanding history of chronic almost daily migraine.  She has trialed medications that have not worked.  She does triptans for rescue inhalers.  She has been trying this over last few days.  Initially they work however headache returns.  She states she has had multiple CTs and MRIs in the past, she states most recently less than 6 months ago while she was out of town visiting family in [place] .  States she had blood work done a few weeks ago as well.  She states her headache today feels like her typical migraine.  No sudden onset thunderclap headache.  Associated photophobia and phonophobia.  She states her typical migraine cocktail is Decadron , Toradol  and Zofran .  She states she cannot tolerate Reglan, Compazine .  She has trigger point injections as well as Botox  for her migraines.  Has failed multiple medications in the outpatient setting.  She denies fever, blurred vision, numbness, weakness, slurred speech, neck pain, neck stiffness, chest pain, shortness of breath abdominal pain, nausea or vomiting.  No pain or swelling in lower extremities.  No recent illnesses.  HPI     Home Medications Prior to Admission medications   Medication Sig Start Date End Date Taking? Authorizing Provider  ALPRAZolam  (XANAX ) 0.5 MG tablet TAKE 1/2 TO 1 TABLET BY MOUTH ONCE DAILY AS NEEDED FOR SLEEP/ANXIETY 10/29/23   Tower, Laine LABOR, MD  atenolol  (TENORMIN ) 25 MG tablet Take 12.5 mg by mouth 3 (three) times daily. For migraine prevention    [provider]  B Complex Vitamins (B COMPLEX PO) Take by mouth.    [provider]  baclofen  (LIORESAL ) 10 MG tablet Take by  mouth as needed. 11/18/21   [provider]  CALCIUM  CARBONATE-VITAMIN D  PO Take 1 tablet by mouth daily. 1000 mg    [provider]  famotidine  (PEPCID ) 40 MG tablet Take 40 mg by mouth 2 (two) times daily.     [provider]  Galcanezumab -gnlm (EMGALITY ) 120 MG/ML SOAJ Inject 120 mg into the skin every 28 (twenty-eight) days. 10/19/23   Ines Onetha NOVAK, MD  hydrocortisone  2.5 % cream Apply to affected areas BID PRN as directed. 10/23/20   Moye, Virginia , MD  hydrOXYzine  (ATARAX ) 25 MG tablet Take 37.5 mg by mouth at bedtime as needed.    [provider]  ketorolac  (TORADOL ) 10 MG tablet Take by mouth.    [provider]  letrozole  (FEMARA ) 2.5 MG tablet Take 1 tablet (2.5 mg total) by mouth daily. 08/10/23   Gudena, Vinay, MD  levocetirizine (XYZAL) 5 MG tablet Take 5 mg by mouth at bedtime.  10/28/17   [provider]  levothyroxine  (SYNTHROID ) 75 MCG tablet TAKE 1 TABLET BY MOUTH EVERY DAY BEFORE BREAKFAST 10/01/23   Tower, Laine LABOR, MD  magnesium  oxide (MAG-OX) 400 MG tablet Take by mouth.    [provider]  Magnesium  Oxide 400 (240 Mg) MG TABS Take 400 mg by mouth daily.     [provider]  metFORMIN  (GLUCOPHAGE -XR) 500 MG 24 hr tablet Take 1 tablet (500 mg total) by mouth daily with breakfast. 10/01/23   Tower, Laine LABOR,  MD  Multiple Vitamin (MULTIVITAMIN WITH MINERALS) TABS tablet Take 1 tablet by mouth daily.    [provider]  nabumetone  (RELAFEN ) 500 MG tablet Take 500 mg by mouth 2 (two) times daily. 09/03/22   [provider]  neomycin-polymyxin b-dexamethasone  (MAXITROL) 3.5-10000-0.1 SUSP 1 drop 3 (three) times daily. 08/27/22   [provider]  Omega-3 Fatty Acids (FISH OIL) 1000 MG CAPS Take 1,000 mg by mouth daily.     [provider]  OnabotulinumtoxinA  (BOTOX  IJ) Inject 1 Dose as directed every 6 (six) weeks.     [provider]  ondansetron  (ZOFRAN  ODT) 4 MG  disintegrating tablet Take 1 tablet (4 mg total) by mouth every 8 (eight) hours as needed. 10/08/20   Edelmiro Leash, MD  pantoprazole  (PROTONIX ) 20 MG tablet TAKE 1 TABLET BY MOUTH EVERY DAY 11/15/23   Zehr, Jessica D, PA-C  PRESCRIPTION MEDICATION Inject into the skin every 6 (six) weeks. Steroid Trigger    [provider]  Probiotic Product (PROBIOTIC-10 PO) Take 1 capsule by mouth daily.     [provider]  promethazine  (PHENERGAN ) 25 MG tablet Take 25 mg by mouth daily as needed. 10/12/22   [provider]  rizatriptan  (MAXALT ) 10 MG tablet Take 1 tablet (10 mg total) by mouth once as needed for up to 10 days for migraine. May repeat in 2 hours if needed 05/28/23 07/27/26  Arlander Charleston, MD  triamcinolone  cream (KENALOG ) 0.1 % Apply to affected areas BID PRN for up to two weeks at a time as directed. 08/02/23   Claudene Lehmann, MD  TURMERIC PO Take 1 tablet by mouth daily.    [provider]      Allergies    Ciprofloxacin, Codeine phosphate, Decongestant [pseudoephedrine], Diclofenac, Diphenhydramine hcl (sleep), Flonase  [fluticasone  propionate], Fluticasone , Hydrocodone , Macrobid  [nitrofurantoin ], Nasal spray, Nasonex  [mometasone ], Reglan [metoclopramide], Topamax [topiramate], Benadryl [diphenhydramine hcl], and Sulfa antibiotics    Review of Systems   Review of Systems  Constitutional: Negative.   HENT: Negative.    Respiratory: Negative.    Cardiovascular: Negative.   Gastrointestinal: Negative.   Genitourinary: Negative.   Musculoskeletal: Negative.   Skin: Negative.   Neurological:  Positive for headaches.  All other systems reviewed and are negative.   Physical Exam Updated Vital Signs BP 124/72 (BP Location: Right Arm)   Pulse (!) 54   Temp 98.1 F (36.7 C)   Resp 16   Wt 59 kg   SpO2 97%   BMI 26.26 kg/m  Physical Exam Physical Exam  Constitutional: Pt is oriented to person, place, and time. Pt appears well-developed and  well-nourished. No distress.  HENT:  Head: Normocephalic and atraumatic.  Mouth/Throat: Oropharynx is clear and moist.  Eyes: Conjunctivae and EOM are normal. Pupils are equal, round, and reactive to light. No scleral icterus.  No horizontal, vertical or rotational nystagmus  Neck: Normal range of motion. Neck supple.  Full active and passive ROM without pain No midline or paraspinal tenderness No nuchal rigidity or meningeal signs  Cardiovascular: Normal rate, regular rhythm and intact distal pulses.   Pulmonary/Chest: Effort normal and breath sounds normal. No respiratory distress. Pt has no wheezes. No rales.  Abdominal: Soft. Bowel sounds are normal. There is no tenderness. There is no rebound and no guarding.  Musculoskeletal: Normal range of motion.  Lymphadenopathy:    No cervical adenopathy.  Neurological: Pt. is alert and oriented to person, place, and time. He has normal reflexes. No cranial nerve deficit.  Exhibits  normal muscle tone. Coordination normal.  Mental Status:  Alert, oriented, thought content appropriate. Speech fluent without evidence of aphasia. Able to follow 2 step commands without difficulty.  Cranial Nerves:  CN 2-12 grossly intact Motor:  Equal strength Sensory: intact sensation Cerebellar: normal finger-to-nose with bilateral upper extremities Gait: normal gait and balance CV: distal pulses palpable throughout   Skin: Skin is warm and dry. No rash noted. Pt is not diaphoretic.  Psychiatric: Pt has a normal mood and affect. Behavior is normal. Judgment and thought content normal.  Nursing note and vitals reviewed.  ED Results / Procedures / Treatments   Labs (all labs ordered are listed, but only abnormal results are displayed) Labs Reviewed - No data to display  EKG None  Radiology No results found.  Procedures Procedures    Medications Ordered in ED Medications  dexamethasone  (DECADRON ) injection 10 mg (10 mg Intravenous Given 11/28/23  1433)  ketorolac  (TORADOL ) 30 MG/ML injection 30 mg (30 mg Intravenous Given 11/28/23 1432)  ondansetron  (ZOFRAN -ODT) disintegrating tablet 4 mg (4 mg Oral Given 11/28/23 1431)  sodium chloride  0.9 % bolus 500 mL (0 mLs Intravenous Stopped 11/28/23 1525)    ED Course/ Medical Decision Making/ A&P   68 year old here for evaluation of migraine.  History of similar.  Has failed multiple medications in the outpatient setting.  She has trigger point injections, Botox , shots, infusions with short-term relief.  She follows with Dr. Darleen.  Her headache today feels similar to her typical migraine.  She is afebrile, nonseptic, not ill-appearing.  She is without fever, neck stiffness or neck rigidity.  I low suspicion for meningitis.  She denies any sudden onset thunderclap headache.  She has a nonfocal neuroexam without deficits.  Her heart and lungs are clear.  Her abdomen is soft, nontender.  No vision changes, eye pain to suggest dissection, acute angle glaucoma.  No facial pain to suggest trigeminal neuralgia, GCA.  Low suspicion for IIH.  No recent spinal or LP procedures.  She states she had head imaging within the last 6 months while she was out of town visiting family.  She is requesting her typical migraine cocktail which is Toradol , Decadron  and Zofran  with IV fluids.  She states she cannot tolerate Reglan and Compazine .  I discussed broadening her workup with head imaging, labs.  Patient declined as she states this feels like her typical headache and she has had this done recently.  I feel this is reasonable.  Will give migraine cocktail and reassess  Patient reassessed.  States migraine resolved she is wanting discharge home.  We discussed strict return precautions.  Will have her follow-up with her neurology office, return for new or worsening symptoms.  Low suspicion for acute CVA, dissection, mass, edema, IIH, acute angle glaucoma, trigeminal neuralgia, meningitis, venous thrombosis, demyelinating  disease, bacterial infectious process.  The patient has been appropriately medically screened and/or stabilized in the ED. I have low suspicion for any other emergent medical condition which would require further screening, evaluation or treatment in the ED or require inpatient management.  Patient is hemodynamically stable and in no acute distress.  Patient able to ambulate in department prior to ED.  Evaluation does not show acute pathology that would require ongoing or additional emergent interventions while in the emergency department or further inpatient treatment.  I have discussed the diagnosis with the patient and answered all questions.  Pain is been managed while in the emergency department and patient has no further complaints prior to  discharge.  Patient is comfortable with plan discussed in room and is stable for discharge at this time.  I have discussed strict return precautions for returning to the emergency department.  Patient was encouraged to follow-up with PCP/specialist refer to at discharge.                                Medical Decision Making Amount and/or Complexity of Data Reviewed External Data Reviewed: labs, radiology and notes.  Risk OTC drugs. Prescription drug management. Parenteral controlled substances. Decision regarding hospitalization. Diagnosis or treatment significantly limited by social determinants of health.          Final Clinical Impression(s) / ED Diagnoses Final diagnoses:  Other migraine without status migrainosus, not intractable    Rx / DC Orders ED Discharge Orders     None         Modell Fendrick A, PA-C 11/28/23 1538    Dreama Longs, MD 11/29/23 2144

## 2023-11-28 NOTE — Discharge Instructions (Signed)
 It was a pleasure taking care of you here in the emergency department  Make sure to let Dr. Trevor Mace office know you were seen here  Follow-up outpatient, return for new or worsening symptoms

## 2023-11-28 NOTE — ED Triage Notes (Signed)
 Pt c/o "migraine" x 5 days. Endorses nausea with photo sensitivity. Last took meds yesterday

## 2023-11-28 NOTE — ED Notes (Signed)

## 2023-11-29 ENCOUNTER — Telehealth: Payer: Self-pay | Admitting: Neurology

## 2023-11-29 ENCOUNTER — Encounter: Payer: Self-pay | Admitting: Neurology

## 2023-11-29 NOTE — Telephone Encounter (Signed)
 Jillian: We can perform her botox  every 3 months if approved in our office (please ensure she is talking about botox  and not nerve blocks which I do not perform on a regular basis). She would have to be placed on an NP schedule please not mine. Can you call patient and ensure she is betting botox  ([facility] performs nerve blocks that I do not)  thanks

## 2023-11-29 NOTE — Telephone Encounter (Signed)
 Dr Lucia Gaskins, are you ok with taking over her Botox? Her insurance is not in network with [facility].

## 2023-11-29 NOTE — Telephone Encounter (Signed)
 Pt said changing about the change in insurance. Asking if Dr. Lucia Gaskins can take  over prescription for medication due to other providers I have is not in my network, they cancelled all Botox appts. Would like a call back to discuss.

## 2023-11-30 ENCOUNTER — Encounter: Payer: Self-pay | Admitting: Hematology and Oncology

## 2023-11-30 ENCOUNTER — Inpatient Hospital Stay: Payer: HMO | Attending: Hematology and Oncology | Admitting: Hematology and Oncology

## 2023-11-30 ENCOUNTER — Encounter: Payer: Self-pay | Admitting: *Deleted

## 2023-11-30 VITALS — BP 113/54 | HR 66 | Temp 97.7°F | Resp 18 | Ht 59.0 in | Wt 131.2 lb

## 2023-11-30 DIAGNOSIS — C50211 Malignant neoplasm of upper-inner quadrant of right female breast: Secondary | ICD-10-CM | POA: Insufficient documentation

## 2023-11-30 DIAGNOSIS — M810 Age-related osteoporosis without current pathological fracture: Secondary | ICD-10-CM | POA: Diagnosis not present

## 2023-11-30 DIAGNOSIS — Z923 Personal history of irradiation: Secondary | ICD-10-CM | POA: Diagnosis not present

## 2023-11-30 DIAGNOSIS — Z79811 Long term (current) use of aromatase inhibitors: Secondary | ICD-10-CM | POA: Diagnosis not present

## 2023-11-30 DIAGNOSIS — D509 Iron deficiency anemia, unspecified: Secondary | ICD-10-CM | POA: Insufficient documentation

## 2023-11-30 DIAGNOSIS — Z170 Estrogen receptor positive status [ER+]: Secondary | ICD-10-CM | POA: Diagnosis not present

## 2023-11-30 NOTE — Progress Notes (Signed)
Receive a message from [employer] representative stating patient did not respond to their call to schedule mobile phlebotomy draw.  Representative states patient will need to reach out to [employer] directly at [phone] option #1 [phone] to schedule if patient wishes to proceed.   

## 2023-11-30 NOTE — Telephone Encounter (Signed)
 She can go to an NP thanks

## 2023-11-30 NOTE — Assessment & Plan Note (Addendum)
 10/10/2018: Right lumpectomy: IDC grade 1, 1.2 cm, with intermediate grade DCIS, margins negative, 0/2 lymph nodes negative, T1CN0 stage Ia ER 100%, PR 90%, Ki-67 10%, HER-2 1+ negative  Oncotype DX: 29: 18% risk of recurrence    Treatment plan:  1. Recommended systemic adjuvant chemotherapy with Taxotere and Cytoxan every 3 weeks x4 cycles (patient refused)  2. Adjuvant radiation therapy 11/08/2018-12/05/2018  3. Adjuvant antiestrogen therapy with anastrozole  started 12/05/2018 switched to letrozole  10/09/19    Letrozole  toxicities: Denies any adverse effects to letrozole    Breast cancer surveillance: 1.  Mammogram 11/19/2022: Left breast calcifications 5 mm stereotactic biopsy: Benign 2. bone density 11/08/2023: T score -2.6: Osteoporosis 3.  Breast exam 08/10/2023: Benign

## 2023-11-30 NOTE — Assessment & Plan Note (Signed)
 Etiology: Blood loss versus malabsorption August 2022: 3 doses of Venofer  Follows with gastroenterology and has had endoscopies and colonoscopies. Lab review:  01/05/2022: Hemoglobin 13, MCV 90, 25% iron  saturation, 94 ferritin  07/06/2022: Hemoglobin 13.4, MCV 89.8, iron  saturation 29%, ferritin 71 08/06/2023: Hemoglobin 13.4, MCV 91.1, iron  saturation 16%, TIBC 465, ferritin 35

## 2023-11-30 NOTE — Progress Notes (Signed)
 Patient Care Team: Tower, Laine LABOR, MD as PCP - General (Family Medicine) Odean Potts, MD as Consulting Physician (Hematology and Oncology) Dewey Rush, MD as Consulting Physician (Radiation Oncology) Curvin Deward MOULD, MD as Consulting Physician (General Surgery)  DIAGNOSIS:  Encounter Diagnoses  Name Primary?   Malignant neoplasm of upper-inner quadrant of right breast in female, estrogen receptor positive (HCC) Yes   Iron  deficiency anemia, unspecified iron  deficiency anemia type     SUMMARY OF ONCOLOGIC HISTORY: Oncology History  Malignant neoplasm of upper-inner quadrant of right breast in female, estrogen receptor positive (HCC)  09/13/2018 Initial Diagnosis   Diagnostic mammogram detected right breast mass with distortion LIQ at 2:00 9 cm from nipple 1.7 cm, at 1:00 there was a benign 1.2 cm fibrocystic change, biopsy of the 2:00 mass revealed grade 1 IDC with DCIS ER 100%, PR 90%, Ki-67 10%, HER-2 1+ negative, T1CN0 stage Ia clinical stage   10/10/2018 Surgery   Right lumpectomy: IDC grade 1, 1.2 cm, with intermediate grade DCIS, margins negative, 0/2 lymph nodes negative, T1CN0 stage Ia ER 100%, PR 90%, Ki-67 10%, HER-2 1+ negative    10/10/2018 Oncotype testing   oncotype results of 29: Risk of recurrence without chemo 18%   11/07/2018 - 12/08/2018 Radiation Therapy   1. Right Breast / 42.56 Gy in 16 fractions 2. Right Breast Boost / 8 Gy in 4 fractions - Total dose 50.56 Gy   12/2018 -  Anti-estrogen oral therapy   Anastrozole  daily, planned for 7 years   11/11/2019 Genetic Testing   Negative genetic testing on the common hereditary cancer panel.  The Common Hereditary Gene Panel offered by [facility] includes sequencing and/or deletion duplication testing of the following 48 genes: APC, ATM, AXIN2, BARD1, BMPR1A, BRCA1, BRCA2, BRIP1, CDH1, CDK4, CDKN2A (p14ARF), CDKN2A (p16INK4a), CHEK2, CTNNA1, DICER1, EPCAM (Deletion/duplication testing only), GREM1 (promoter region  deletion/duplication testing only), KIT, MEN1, MLH1, MSH2, MSH3, MSH6, MUTYH, NBN, NF1, NHTL1, PALB2, PDGFRA, PMS2, POLD1, POLE, PTEN, RAD50, RAD51C, RAD51D, RNF43, SDHB, SDHC, SDHD, SMAD4, SMARCA4. STK11, TP53, TSC1, TSC2, and VHL.  The following genes were evaluated for sequence changes only: SDHA and HOXB13 c.251G>A variant only. The report date is 11/11/2019     CHIEF COMPLIANT: Follow-up to discuss treatment of osteoporosis  HISTORY OF PRESENT ILLNESS:  History of Present Illness   The patient, with a history of breast cancer and iron  def anemia on hormone therapy, presents after a recent bone density test revealed osteoporosis with a score of -2.6. She reports frustration with her gynecologist's office, which has been unresponsive to her attempts to discuss the results and next steps. The patient is also dealing with the stress of changing insurance and managing her migraine medications. She expresses concern about adding more pills to her regimen due to existing reflux issues. The patient has no major dental issues and is interested in pursuing injection treatments for her osteoporosis. She is also dealing with the stress of changing insurance and managing her migraine medications.         ALLERGIES:  is allergic to ciprofloxacin, codeine phosphate, decongestant [pseudoephedrine], diclofenac, diphenhydramine hcl (sleep), flonase  [fluticasone  propionate], fluticasone , hydrocodone , macrobid  [nitrofurantoin ], nasal spray, nasonex  [mometasone ], reglan [metoclopramide], topamax [topiramate], benadryl [diphenhydramine hcl], and sulfa antibiotics.  MEDICATIONS:  Current Outpatient Medications  Medication Sig Dispense Refill   ALPRAZolam  (XANAX ) 0.5 MG tablet TAKE 1/2 TO 1 TABLET BY MOUTH ONCE DAILY AS NEEDED FOR SLEEP/ANXIETY 15 tablet 2   atenolol  (TENORMIN ) 25 MG tablet Take 12.5 mg  by mouth 3 (three) times daily. For migraine prevention     B Complex Vitamins (B COMPLEX PO) Take by mouth.      baclofen  (LIORESAL ) 10 MG tablet Take by mouth as needed.     CALCIUM  CARBONATE-VITAMIN D  PO Take 1 tablet by mouth daily. 1000 mg     famotidine  (PEPCID ) 40 MG tablet Take 40 mg by mouth 2 (two) times daily.      Galcanezumab -gnlm (EMGALITY ) 120 MG/ML SOAJ Inject 120 mg into the skin every 28 (twenty-eight) days. 1.12 mL 11   hydrocortisone  2.5 % cream Apply to affected areas BID PRN as directed. 30 g 3   hydrOXYzine  (ATARAX ) 25 MG tablet Take 37.5 mg by mouth at bedtime as needed.     ketorolac  (TORADOL ) 10 MG tablet Take by mouth.     letrozole  (FEMARA ) 2.5 MG tablet Take 1 tablet (2.5 mg total) by mouth daily. 90 tablet 3   levocetirizine (XYZAL) 5 MG tablet Take 5 mg by mouth at bedtime.   3   levothyroxine  (SYNTHROID ) 75 MCG tablet TAKE 1 TABLET BY MOUTH EVERY DAY BEFORE BREAKFAST 90 tablet 0   magnesium  oxide (MAG-OX) 400 MG tablet Take by mouth.     Magnesium  Oxide 400 (240 Mg) MG TABS Take 400 mg by mouth daily.      metFORMIN  (GLUCOPHAGE -XR) 500 MG 24 hr tablet Take 1 tablet (500 mg total) by mouth daily with breakfast. 90 tablet 3   Multiple Vitamin (MULTIVITAMIN WITH MINERALS) TABS tablet Take 1 tablet by mouth daily.     nabumetone  (RELAFEN ) 500 MG tablet Take 500 mg by mouth 2 (two) times daily.     neomycin-polymyxin b-dexamethasone  (MAXITROL) 3.5-10000-0.1 SUSP 1 drop 3 (three) times daily.     Omega-3 Fatty Acids (FISH OIL) 1000 MG CAPS Take 1,000 mg by mouth daily.      OnabotulinumtoxinA  (BOTOX  IJ) Inject 1 Dose as directed every 6 (six) weeks.      ondansetron  (ZOFRAN  ODT) 4 MG disintegrating tablet Take 1 tablet (4 mg total) by mouth every 8 (eight) hours as needed. 20 tablet 0   pantoprazole  (PROTONIX ) 20 MG tablet TAKE 1 TABLET BY MOUTH EVERY DAY 90 tablet 1   PRESCRIPTION MEDICATION Inject into the skin every 6 (six) weeks. Steroid Trigger     Probiotic Product (PROBIOTIC-10 PO) Take 1 capsule by mouth daily.      promethazine  (PHENERGAN ) 25 MG tablet Take 25 mg by  mouth daily as needed.     rizatriptan  (MAXALT ) 10 MG tablet Take 1 tablet (10 mg total) by mouth once as needed for up to 10 days for migraine. May repeat in 2 hours if needed 10 tablet 0   triamcinolone  cream (KENALOG ) 0.1 % Apply to affected areas BID PRN for up to two weeks at a time as directed. 30 g 1   TURMERIC PO Take 1 tablet by mouth daily.     Current Facility-Administered Medications  Medication Dose Route Frequency Provider Last Rate Last Admin   Galcanezumab -gnlm SOAJ 240 mg  240 mg Subcutaneous Once         PHYSICAL EXAMINATION: ECOG PERFORMANCE STATUS: 1 - Symptomatic but completely ambulatory  Vitals:   11/30/23 1342  BP: (!) 113/54  Pulse: 66  Resp: 18  Temp: 97.7 F (36.5 C)  SpO2: 98%   Filed Weights   11/30/23 1342  Weight: 131 lb 3.2 oz (59.5 kg)   LABORATORY DATA:  I have reviewed the data as listed  Latest Ref Rng & Units 10/20/2023    2:10 PM 09/17/2023    9:43 AM 08/06/2023   12:10 PM  CMP  Glucose 70 - 99 mg/dL 84  93  92   BUN 8 - 23 mg/dL 17  15  18    Creatinine 0.44 - 1.00 mg/dL 9.29  9.35  9.29   Sodium 135 - 145 mmol/L 131  137  137   Potassium 3.5 - 5.1 mmol/L 4.0  4.6  3.9   Chloride 98 - 111 mmol/L 97  98  102   CO2 22 - 32 mmol/L 26  32  29   Calcium  8.9 - 10.3 mg/dL 9.3  9.6  9.6   Total Protein 6.0 - 8.3 g/dL  7.1  7.5   Total Bilirubin 0.2 - 1.2 mg/dL  0.5  0.4   Alkaline Phos 39 - 117 U/L  106  101   AST 0 - 37 U/L  17  18   ALT 0 - 35 U/L  17  19     Lab Results  Component Value Date   WBC 6.3 10/20/2023   HGB 13.4 10/20/2023   HCT 39.4 10/20/2023   MCV 90.2 10/20/2023   PLT 327 10/20/2023   NEUTROABS 2.7 09/20/2023    ASSESSMENT & PLAN:  Malignant neoplasm of upper-inner quadrant of right breast in female, estrogen receptor positive (HCC) 10/10/2018: Right lumpectomy: IDC grade 1, 1.2 cm, with intermediate grade DCIS, margins negative, 0/2 lymph nodes negative, T1CN0 stage Ia ER 100%, PR 90%, Ki-67 10%, HER-2 1+  negative  Oncotype DX: 29: 18% risk of recurrence    Treatment plan:  1. Recommended systemic adjuvant chemotherapy with Taxotere and Cytoxan every 3 weeks x4 cycles (patient refused)  2. Adjuvant radiation therapy 11/08/2018-12/05/2018  3. Adjuvant antiestrogen therapy with anastrozole  started 12/05/2018 switched to letrozole  10/09/19    Letrozole  toxicities: Denies any adverse effects to letrozole    Breast cancer surveillance: 1.  Mammogram 11/19/2022: Left breast calcifications 5 mm stereotactic biopsy: Benign 2. bone density 11/08/2023: T score -2.6: Osteoporosis 3.  Breast exam 08/10/2023: Benign   Osteoporosis discussion: I recommended treatment with Prolia  along with calcium  and vitamin D .  She will start Prolia  injections in 2 weeks. I will see her back along with her next Prolia  injection in 6 months.  Iron  deficiency anemia Etiology: Blood loss versus malabsorption August 2022: 3 doses of Venofer  Follows with gastroenterology and has had endoscopies and colonoscopies. Lab review:  01/05/2022: Hemoglobin 13, MCV 90, 25% iron  saturation, 94 ferritin  07/06/2022: Hemoglobin 13.4, MCV 89.8, iron  saturation 29%, ferritin 71 08/06/2023: Hemoglobin 13.4, MCV 91.1, iron  saturation 16%, TIBC 465, ferritin 35    No orders of the defined types were placed in this encounter.  The patient has a good understanding of the overall plan. she agrees with it. she will call with any problems that may develop before the next visit here. Total time spent: 30 mins including face to face time and time spent for planning, charting and co-ordination of care   Viinay K Stavroula Rohde, MD 11/30/23

## 2023-11-30 NOTE — Telephone Encounter (Addendum)
 FYI- I called pt and confirmed that she does want the Botox  injections, not nerve blocks. She is aware that Dr. Ines does not perform nerve blocks and did not feel they were helping much anyway. I will work on getting Botox  approved. Submitted benefit verification: [id number]  Pt was upset over the phone and expressed extreme concern over her migraine care. It seems she sent a MyChart message yesterday addressing these concerns as well.

## 2023-12-02 ENCOUNTER — Encounter: Payer: Self-pay | Admitting: Hematology and Oncology

## 2023-12-03 ENCOUNTER — Other Ambulatory Visit (HOSPITAL_COMMUNITY): Payer: Self-pay

## 2023-12-03 ENCOUNTER — Encounter: Payer: Self-pay | Admitting: Hematology and Oncology

## 2023-12-05 ENCOUNTER — Other Ambulatory Visit: Payer: Self-pay | Admitting: Family Medicine

## 2023-12-07 ENCOUNTER — Other Ambulatory Visit: Payer: Self-pay | Admitting: Obstetrics and Gynecology

## 2023-12-07 DIAGNOSIS — Z1231 Encounter for screening mammogram for malignant neoplasm of breast: Secondary | ICD-10-CM

## 2023-12-14 ENCOUNTER — Inpatient Hospital Stay: Payer: HMO

## 2023-12-14 VITALS — BP 145/68 | HR 60 | Temp 98.2°F | Resp 18

## 2023-12-14 DIAGNOSIS — D509 Iron deficiency anemia, unspecified: Secondary | ICD-10-CM

## 2023-12-14 DIAGNOSIS — M810 Age-related osteoporosis without current pathological fracture: Secondary | ICD-10-CM

## 2023-12-14 DIAGNOSIS — C50211 Malignant neoplasm of upper-inner quadrant of right female breast: Secondary | ICD-10-CM

## 2023-12-14 LAB — CBC WITH DIFFERENTIAL (CANCER CENTER ONLY)
Abs Immature Granulocytes: 0.01 10*3/uL (ref 0.00–0.07)
Basophils Absolute: 0 10*3/uL (ref 0.0–0.1)
Basophils Relative: 1 %
Eosinophils Absolute: 0.1 10*3/uL (ref 0.0–0.5)
Eosinophils Relative: 2 %
HCT: 39.1 % (ref 36.0–46.0)
Hemoglobin: 13.3 g/dL (ref 12.0–15.0)
Immature Granulocytes: 0 %
Lymphocytes Relative: 30 %
Lymphs Abs: 1.9 10*3/uL (ref 0.7–4.0)
MCH: 30.7 pg (ref 26.0–34.0)
MCHC: 34 g/dL (ref 30.0–36.0)
MCV: 90.3 fL (ref 80.0–100.0)
Monocytes Absolute: 0.6 10*3/uL (ref 0.1–1.0)
Monocytes Relative: 9 %
Neutro Abs: 3.8 10*3/uL (ref 1.7–7.7)
Neutrophils Relative %: 58 %
Platelet Count: 341 10*3/uL (ref 150–400)
RBC: 4.33 MIL/uL (ref 3.87–5.11)
RDW: 12.4 % (ref 11.5–15.5)
WBC Count: 6.4 10*3/uL (ref 4.0–10.5)
nRBC: 0 % (ref 0.0–0.2)

## 2023-12-14 LAB — CMP (CANCER CENTER ONLY)
ALT: 15 U/L (ref 0–44)
AST: 17 U/L (ref 15–41)
Albumin: 4.4 g/dL (ref 3.5–5.0)
Alkaline Phosphatase: 96 U/L (ref 38–126)
Anion gap: 6 (ref 5–15)
BUN: 17 mg/dL (ref 8–23)
CO2: 31 mmol/L (ref 22–32)
Calcium: 9.8 mg/dL (ref 8.9–10.3)
Chloride: 98 mmol/L (ref 98–111)
Creatinine: 0.66 mg/dL (ref 0.44–1.00)
GFR, Estimated: >60 mL/min (ref >60)
Glucose, Bld: 81 mg/dL (ref 70–99)
Potassium: 4.1 mmol/L (ref 3.5–5.1)
Sodium: 135 mmol/L (ref 135–145)
Total Bilirubin: 0.6 mg/dL (ref 0.0–1.2)
Total Protein: 7.3 g/dL (ref 6.5–8.1)

## 2023-12-14 LAB — IRON AND IRON BINDING CAPACITY (CC-WL,HP ONLY)
Iron: 129 ug/dL (ref 28–170)
Saturation Ratios: 27 % (ref 10.4–31.8)
TIBC: 482 ug/dL — ABNORMAL HIGH (ref 250–450)
UIBC: 353 ug/dL (ref 148–442)

## 2023-12-14 LAB — FERRITIN: Ferritin: 25 ng/mL (ref 11–307)

## 2023-12-14 MED ORDER — DENOSUMAB 60 MG/ML ~~LOC~~ SOSY
60.0000 mg | PREFILLED_SYRINGE | Freq: Once | SUBCUTANEOUS | Status: AC
Start: 2023-12-14 — End: 2023-12-14
  Administered 2023-12-14: 60 mg via SUBCUTANEOUS
  Filled 2023-12-14: qty 1

## 2023-12-14 NOTE — Patient Instructions (Signed)
Denosumab Injection (Osteoporosis) What is this medication? DENOSUMAB (den oh SUE mab) prevents and treats osteoporosis. It works by making your bones stronger and less likely to break (fracture). It is a monoclonal antibody. This medicine may be used for other purposes; ask your health care provider or pharmacist if you have questions. COMMON BRAND NAME(S): Prolia What should I tell my care team before I take this medication? They need to know if you have any of these conditions: Dental or gum disease Had thyroid or parathyroid (glands located in neck) surgery Having dental surgery or a tooth pulled Kidney disease Low levels of calcium in the blood On dialysis Poor nutrition Thyroid disease Trouble absorbing nutrients from your food An unusual or allergic reaction to denosumab, other medications, foods, dyes, or preservatives Pregnant or trying to get pregnant Breastfeeding How should I use this medication? This medication is injected under the skin. It is given by your care team in a hospital or clinic setting. A special MedGuide will be given to you before each treatment. Be sure to read this information carefully each time. Talk to your care team about the use of this medication in children. Special care may be needed. Overdosage: If you think you have taken too much of this medicine contact a poison control center or emergency room at once. NOTE: This medicine is only for you. Do not share this medicine with others. What if I miss a dose? Keep appointments for follow-up doses. It is important not to miss your dose. Call your care team if you are unable to keep an appointment. What may interact with this medication? Do not take this medication with any of the following: Other medications that contain denosumab This medication may also interact with the following: Medications that lower your chance of fighting infection Steroid medications, such as prednisone or cortisone This  list may not describe all possible interactions. Give your health care provider a list of all the medicines, herbs, non-prescription drugs, or dietary supplements you use. Also tell them if you smoke, drink alcohol, or use illegal drugs. Some items may interact with your medicine. What should I watch for while using this medication? Your condition will be monitored carefully while you are receiving this medication. You may need blood work done while taking this medication. This medication may increase your risk of getting an infection. Call your care team for advice if you get a fever, chills, sore throat, or other symptoms of a cold or flu. Do not treat yourself. Try to avoid being around people who are sick. Tell your dentist and dental surgeon that you are taking this medication. You should not have major dental surgery while on this medication. See your dentist to have a dental exam and fix any dental problems before starting this medication. Take good care of your teeth while on this medication. Make sure you see your dentist for regular follow-up appointments. This medication may cause low levels of calcium in your body. The risk of severe side effects is increased in people with kidney disease. Your care team may prescribe calcium and vitamin D to help prevent low calcium levels while you take this medication. It is important to take calcium and vitamin D as directed by your care team. Talk to your care team if you may be pregnant. Serious birth defects may occur if you take this medication during pregnancy and for 5 months after the last dose. You will need a negative pregnancy test before starting this medication. Contraception   is recommended while taking this medication and for 5 months after the last dose. Your care team can help you find the option that works for you. Talk to your care team before breastfeeding. Changes to your treatment plan may be needed. What side effects may I notice from  receiving this medication? Side effects that you should report to your care team as soon as possible: Allergic reactions--skin rash, itching, hives, swelling of the face, lips, tongue, or throat Infection--fever, chills, cough, sore throat, wounds that don't heal, pain or trouble when passing urine, general feeling of discomfort or being unwell Low calcium level--muscle pain or cramps, confusion, tingling, or numbness in the hands or feet Osteonecrosis of the jaw--pain, swelling, or redness in the mouth, numbness of the jaw, poor healing after dental work, unusual discharge from the mouth, visible bones in the mouth Severe bone, joint, or muscle pain Skin infection--skin redness, swelling, warmth, or pain Side effects that usually do not require medical attention (report these to your care team if they continue or are bothersome): Back pain Headache Joint pain Muscle pain Pain in the hands, arms, legs, or feet Runny or stuffy nose Sore throat This list may not describe all possible side effects. Call your doctor for medical advice about side effects. You may report side effects to FDA at 1-800-FDA-1088. Where should I keep my medication? This medication is given in a hospital or clinic. It will not be stored at home. NOTE: This sheet is a summary. It may not cover all possible information. If you have questions about this medicine, talk to your doctor, pharmacist, or health care provider.  2024 Elsevier/Gold Standard (2022-12-15 00:00:00)  

## 2023-12-14 NOTE — Telephone Encounter (Addendum)
[facility] does not require PA for B/B (reference # [id number]), sent MyChart msg to pt offering 1/28 @ 12:45 pm with Shanda Bumps.

## 2023-12-20 ENCOUNTER — Telehealth: Payer: Self-pay | Admitting: Neurology

## 2023-12-20 NOTE — Telephone Encounter (Signed)
Appointment time confirmed

## 2023-12-20 NOTE — Telephone Encounter (Signed)
I sent this phone note to [facility] w/ [facility] so their team can handle.

## 2023-12-20 NOTE — Telephone Encounter (Signed)
Spoke with [place]. She had already received the call and spoke with [facility].

## 2023-12-20 NOTE — Telephone Encounter (Signed)
[employer]/Crystal requesting clarification how much Vyepti is being requested for 90-day period. Crystal requested to be transferred to infusion to be able speak to someone asap. She did not request a call back.

## 2023-12-21 ENCOUNTER — Ambulatory Visit: Payer: HMO | Admitting: Neurology

## 2023-12-21 ENCOUNTER — Ambulatory Visit: Payer: HMO | Admitting: Adult Health

## 2023-12-21 VITALS — BP 126/71 | HR 63

## 2023-12-21 DIAGNOSIS — G43711 Chronic migraine without aura, intractable, with status migrainosus: Secondary | ICD-10-CM

## 2023-12-21 MED ORDER — ONABOTULINUMTOXINA 200 UNITS IJ SOLR
155.0000 [IU] | Freq: Once | INTRAMUSCULAR | Status: AC
  Administered 2023-12-21: 155 [IU] via INTRAMUSCULAR

## 2023-12-21 NOTE — Progress Notes (Signed)
   BOTOX PROCEDURE NOTE FOR MIGRAINE HEADACHE  HISTORY: Here for Botox. This is the 1st time at our office for Botox, she was getting at Dr. Onnie Boer. Is nearly exactly 12 weeks from last Botox. Has had 10 migraines this month, is still waiting on infusion for Vyepti. Remains on Emgality. Uses Maxalt for rescue, pretty much always works, uses Toradol as back up. Will go to the ER after 3 days of migraines for IV infusion, she used to get trigger point injections but didn't feel like it was helping so stopped it.   Description of procedure:  The patient was placed in a sitting position. The standard protocol was used for Botox as follows, with 5 units of Botox injected at each site:  -Procerus muscle, midline injection  -Corrugator muscle, bilateral injection  -Frontalis muscle, bilateral injection, with 2 sites each side, medial injection was performed in the upper one third of the frontalis muscle, in the region vertical from the medial inferior edge of the superior orbital rim. The lateral injection was again in the upper one third of the forehead vertically above the lateral limbus of the cornea, 1.5 cm lateral to the medial injection site.  -Temporalis muscle injection, 4 sites, bilaterally. The first injection was 3 cm above the tragus of the ear, second injection site was 1.5 cm to 3 cm up from the first injection site in line with the tragus of the ear. The third injection site was 1.5-3 cm forward between the first 2 injection sites. The fourth injection site was 1.5 cm posterior to the second injection site.  -Occipitalis muscle injection, 3 sites, bilaterally. The first injection was done one half way between the occipital protuberance and the tip of the mastoid process behind the ear. The second injection site was done lateral and superior to the first, 1 fingerbreadth from the first injection. The third injection site was 1 fingerbreadth superiorly and medially from the first injection  site.  -Cervical paraspinal muscle injection, 2 sites, bilateral, the first injection site was 1 cm from the midline of the cervical spine, 3 cm inferior to the lower border of the occipital protuberance. The second injection site was 1.5 cm superiorly and laterally to the first injection site.  -Trapezius muscle injection was performed at 3 sites, bilaterally. The first injection site was in the upper trapezius muscle halfway between the inflection point of the neck, and the acromion. The second injection site was one half way between the acromion and the first injection site. The third injection was done between the first injection site and the inflection point of the neck.   A 200 unit bottle of Botox was used, 155 units were injected, the rest of the Botox was wasted. The patient tolerated the procedure well, there were no complications of the above procedure.  Botox NDC 1610-9604-54 Lot number [id number] Expiration date 02/2026 BB  She is pending approval for Vyepti. For now, will continue on the Emgality.

## 2023-12-21 NOTE — Progress Notes (Signed)
Botox- 200 units x 1 vial Lot: [id number] Expiration: 02/2026 NDC: 0865-7846-96   Bacteriostatic 0.9% Sodium Chloride- * mL  Lot: [id number] Expiration: 09/23/2024 NDC: 1324-4010-27   Dx: [id number] BB Witnessed by: Duffy Bruce

## 2023-12-27 ENCOUNTER — Encounter: Payer: Self-pay | Admitting: Neurology

## 2023-12-27 NOTE — Telephone Encounter (Signed)
Patient called in as well about a walk in infusion appt for her migraines

## 2023-12-28 ENCOUNTER — Ambulatory Visit: Payer: HMO

## 2023-12-29 ENCOUNTER — Ambulatory Visit: Payer: HMO | Admitting: Podiatry

## 2023-12-29 ENCOUNTER — Encounter: Payer: Self-pay | Admitting: Podiatry

## 2023-12-29 DIAGNOSIS — L600 Ingrowing nail: Secondary | ICD-10-CM | POA: Diagnosis not present

## 2023-12-29 NOTE — Progress Notes (Signed)
 Subjective:  Patient ID: Jennifer [name], female    DOB: [birth year],  MRN: [id number]  Chief Complaint  Patient presents with   Ingrown Toenail    RM#11 Right foot pinky toe has a growth not sure what it is believes her toes are crowding together.    68 y.o. female presents with the above complaint.  Patient presents with right fifth digit medial border ingrown.  Patient states painful to touch is progressive gotten worse hurts with ambulation worse with pressure she would like to have it removed.  Pain scale 7 out of 10 dull achy in nature hurts with ambulation worse with pressure.   Review of Systems: Negative except as noted in the HPI. Denies N/V/F/Ch.  Past Medical History:  Diagnosis Date   Allergic rhinitis    Allergy    seasonal   Anemia    Anxiety    Arthritis    hands, knees   Asthma    as a child   Blood transfusion without reported diagnosis    as a child   Breast cancer (HCC) 2019   Right Breast Cancer   Cancer (HCC) 09/2018   Breast CA new DX, right breast   Cervical dysplasia    Common bile duct dilation    Diabetes mellitus without complication (HCC)    Endometriosis    Esophageal reflux    Family history of adverse reaction to anesthesia    younger sister anxiety for a dew days after   Family history of breast cancer    Family history of breast cancer    Family history of prostate cancer    Hepatic cyst 08/2020   multiple   Hepatic hemangioma 08/2020   intrahepatic hemangioma   Hiatal hernia    HSV-1 (herpes simplex virus 1) infection    Hyperlipidemia    Hypertension    pt.denies 10/19/22   Hypothyroid    Migraine with aura, without mention of intractable migraine without mention of status migrainosus    Osteopenia 08/2019   T score -2.2 FRAX 11% / 1.7%   Osteoporosis 05/07/2008   Qualifier: Diagnosis of  By: Randeen MD, Laine Caldron    Personal history of radiation therapy 2019   Right Breast Cancer   Pre-diabetes    Raynaud's disease      Current Outpatient Medications:    ALPRAZolam  (XANAX ) 0.5 MG tablet, TAKE 1/2 TO 1 TABLET BY MOUTH ONCE DAILY AS NEEDED FOR SLEEP/ANXIETY, Disp: 15 tablet, Rfl: 2   atenolol  (TENORMIN ) 25 MG tablet, Take 12.5 mg by mouth 3 (three) times daily. For migraine prevention, Disp: , Rfl:    B Complex Vitamins (B COMPLEX PO), Take by mouth., Disp: , Rfl:    baclofen  (LIORESAL ) 10 MG tablet, Take by mouth as needed., Disp: , Rfl:    CALCIUM  CARBONATE-VITAMIN D  PO, Take 1 tablet by mouth daily. 1000 mg, Disp: , Rfl:    famotidine  (PEPCID ) 40 MG tablet, Take 40 mg by mouth 2 (two) times daily. , Disp: , Rfl:    Galcanezumab -gnlm (EMGALITY ) 120 MG/ML SOAJ, Inject 120 mg into the skin every 28 (twenty-eight) days., Disp: 1.12 mL, Rfl: 11   hydrocortisone  2.5 % cream, Apply to affected areas BID PRN as directed., Disp: 30 g, Rfl: 3   hydrOXYzine  (ATARAX ) 25 MG tablet, Take 37.5 mg by mouth at bedtime as needed., Disp: , Rfl:    ketorolac  (TORADOL ) 10 MG tablet, Take by mouth., Disp: , Rfl:    letrozole  (FEMARA ) 2.5 MG tablet,  Take 1 tablet (2.5 mg total) by mouth daily., Disp: 90 tablet, Rfl: 3   levocetirizine (XYZAL) 5 MG tablet, Take 5 mg by mouth at bedtime. , Disp: , Rfl: 3   levothyroxine  (SYNTHROID ) 75 MCG tablet, TAKE 1 TABLET BY MOUTH EVERY DAY BEFORE BREAKFAST, Disp: 90 tablet, Rfl: 2   magnesium  oxide (MAG-OX) 400 MG tablet, Take by mouth., Disp: , Rfl:    Magnesium  Oxide 400 (240 Mg) MG TABS, Take 400 mg by mouth daily. , Disp: , Rfl:    metFORMIN  (GLUCOPHAGE -XR) 500 MG 24 hr tablet, Take 1 tablet (500 mg total) by mouth daily with breakfast., Disp: 90 tablet, Rfl: 3   Multiple Vitamin (MULTIVITAMIN WITH MINERALS) TABS tablet, Take 1 tablet by mouth daily., Disp: , Rfl:    nabumetone  (RELAFEN ) 500 MG tablet, Take 500 mg by mouth 2 (two) times daily., Disp: , Rfl:    neomycin-polymyxin b-dexamethasone  (MAXITROL) 3.5-10000-0.1 SUSP, 1 drop 3 (three) times daily., Disp: , Rfl:    Omega-3 Fatty  Acids (FISH OIL) 1000 MG CAPS, Take 1,000 mg by mouth daily. , Disp: , Rfl:    OnabotulinumtoxinA  (BOTOX  IJ), Inject 1 Dose as directed every 6 (six) weeks. , Disp: , Rfl:    ondansetron  (ZOFRAN  ODT) 4 MG disintegrating tablet, Take 1 tablet (4 mg total) by mouth every 8 (eight) hours as needed., Disp: 20 tablet, Rfl: 0   pantoprazole  (PROTONIX ) 20 MG tablet, TAKE 1 TABLET BY MOUTH EVERY DAY, Disp: 90 tablet, Rfl: 1   PRESCRIPTION MEDICATION, Inject into the skin every 6 (six) weeks. Steroid Trigger, Disp: , Rfl:    Probiotic Product (PROBIOTIC-10 PO), Take 1 capsule by mouth daily. , Disp: , Rfl:    promethazine  (PHENERGAN ) 25 MG tablet, Take 25 mg by mouth daily as needed., Disp: , Rfl:    rizatriptan  (MAXALT ) 10 MG tablet, Take 1 tablet (10 mg total) by mouth once as needed for up to 10 days for migraine. May repeat in 2 hours if needed, Disp: 10 tablet, Rfl: 0   triamcinolone  cream (KENALOG ) 0.1 %, Apply to affected areas BID PRN for up to two weeks at a time as directed., Disp: 30 g, Rfl: 1   TURMERIC PO, Take 1 tablet by mouth daily., Disp: , Rfl:   Current Facility-Administered Medications:    Galcanezumab -gnlm SOAJ 240 mg, 240 mg, Subcutaneous, Once,   Social History   Tobacco Use  Smoking Status Never   Passive exposure: Never  Smokeless Tobacco Never    Allergies  Allergen Reactions   Ciprofloxacin Other (See Comments)    Dizziness    Codeine Phosphate Other (See Comments)    Dizziness, heart palpitations, nervous   Decongestant [Pseudoephedrine] Other (See Comments)    dizziness   Diclofenac    Diphenhydramine Hcl (Sleep)    Flonase  [Fluticasone  Propionate] Other (See Comments)    Worsens her migraine    Fluticasone     Hydrocodone  Other (See Comments)    Made nervous--10/09 surgery rotator cuff   Macrobid  [Nitrofurantoin ]     headaches   Nasal Spray    Nasonex  [Mometasone ]     Migraines   Reglan [Metoclopramide] Other (See Comments)    Dizziness   Topamax  [Topiramate] Other (See Comments)    Dizziness    Benadryl [Diphenhydramine Hcl] Palpitations   Sulfa Antibiotics Anxiety   Objective:  There were no vitals filed for this visit. There is no height or weight on file to calculate BMI. Constitutional Well developed. Well nourished.  Vascular Dorsalis pedis pulses palpable bilaterally. Posterior tibial pulses palpable bilaterally. Capillary refill normal to all digits.  No cyanosis or clubbing noted. Pedal hair growth normal.  Neurologic Normal speech. Oriented to person, place, and time. Epicritic sensation to light touch grossly present bilaterally.  Dermatologic Painful ingrowing nail at medial nail borders of the hallux nail right. No other open wounds. No skin lesions.  Orthopedic: Normal joint ROM without pain or crepitus bilaterally. No visible deformities. No bony tenderness.   Radiographs: None Assessment:  No diagnosis found. Plan:  Patient was evaluated and treated and all questions answered.  Ingrown Nail, right -Patient elects to proceed with minor surgery to remove ingrown toenail removal today. Consent reviewed and signed by patient. -Ingrown nail excised. See procedure note. -Educated on post-procedure care including soaking. Written instructions provided and reviewed. -Patient to follow up in 2 weeks for nail check.  Procedure: Excision of Ingrown Toenail Location: Right 5th toe medial nail borders. Anesthesia: Lidocaine  1% plain; 1.5 mL and Marcaine  0.5% plain; 1.5 mL, digital block. Skin Prep: Betadine. Dressing: Silvadene; telfa; dry, sterile, compression dressing. Technique: Following skin prep, the toe was exsanguinated and a tourniquet was secured at the base of the toe. The affected nail border was freed, split with a nail splitter, and excised. Chemical matrixectomy was then performed with phenol and irrigated out with alcohol. The tourniquet was then removed and sterile dressing applied. Disposition:  Patient tolerated procedure well. Patient to return in 2 weeks for follow-up.   No follow-ups on file.Right fifth medial ingrown

## 2023-12-29 NOTE — Patient Instructions (Signed)

## 2024-01-03 ENCOUNTER — Encounter: Payer: Self-pay | Admitting: Physical Medicine and Rehabilitation

## 2024-01-03 ENCOUNTER — Ambulatory Visit: Payer: HMO | Admitting: Physical Medicine and Rehabilitation

## 2024-01-03 DIAGNOSIS — M549 Dorsalgia, unspecified: Secondary | ICD-10-CM | POA: Diagnosis not present

## 2024-01-03 DIAGNOSIS — G8929 Other chronic pain: Secondary | ICD-10-CM | POA: Diagnosis not present

## 2024-01-03 DIAGNOSIS — M79601 Pain in right arm: Secondary | ICD-10-CM

## 2024-01-03 DIAGNOSIS — M898X1 Other specified disorders of bone, shoulder: Secondary | ICD-10-CM

## 2024-01-03 DIAGNOSIS — M7918 Myalgia, other site: Secondary | ICD-10-CM

## 2024-01-03 MED ORDER — BACLOFEN 10 MG PO TABS: 10.0000 mg | ORAL_TABLET | Freq: Three times a day (TID) | ORAL | 0 refills | Status: AC

## 2024-01-03 MED ORDER — MELOXICAM 15 MG PO TABS: 15.0000 mg | ORAL_TABLET | Freq: Every day | ORAL | 0 refills | Status: DC

## 2024-01-03 NOTE — Progress Notes (Signed)
R shoulder pain. She feels like she over did it. She stated last week when she made the appt it was worse it has toned down a little.  She has limited rom in her r am.  Unknown injury.

## 2024-01-03 NOTE — Progress Notes (Signed)
Jennifer [name] - 68 y.o. female MRN [id number]  Date of birth: [birth year]  Office Visit Note: Visit Date: 01/03/2024 PCP: Judy Pimple, MD Referred by: Tower, Audrie Gallus, MD  Subjective: Chief Complaint  Patient presents with   Neck - Pain   Spine - Pain   HPI: Jennifer [name] is a 68 y.o. female who comes in today for evaluation of chronic, worsening and severe acute on chronic right scapular pain radiating to shoulder and upper arm, intermittent radiation of pain to head. Pain started last week after changing air filters in her home. Her pain worsens with movement and activity, she describes her pain as sharp sensation, currently rates as 2 out of 10. Some relief of pain with home exercise regimen, rest and use of medications. History of formal physical therapy and dry needling, she reports significant relief of pain with these treatments. CT of cervical spine in March of 2024 from [facility] exhibits multi level spondylosis, moderate right sided foraminal narrowing on the right at C3-C4, severe left foraminal narrowing at C5-C6. No high grade spinal canal stenosis noted. History of bilateral rotator cuff repair with [facility]. She does have history of chronic migraine, currently managed by Dr. Naomie Dean with [facility]. She was recently diagnosed with osteoporosis, undergoing Prolia injections. Patient denies focal weakness, numbness and tingling. No recent trauma or falls.      Review of Systems  Musculoskeletal:  Positive for myalgias and neck pain.  Neurological:  Negative for tingling, sensory change, focal weakness and weakness.  All other systems reviewed and are negative.  Otherwise per HPI.  Assessment & Plan: Visit Diagnoses:    ICD-10-CM   1. Chronic scapular pain  M89.8X1 Ambulatory referral to Physical Therapy   G89.29     2. Chronic upper back pain  M54.9 Ambulatory referral to Physical Therapy   G89.29     3. Pain of right upper extremity  M79.601 Ambulatory  referral to Physical Therapy    4. Myofascial pain syndrome  M79.18 Ambulatory referral to Physical Therapy       Plan: Findings:  Acute on chronic right scapular pain radiating to shoulder and upper arm, intermittent radiation of pain to head. No paresthesias down arms. Patient continues to have severe pain despite good conservative therapies such as formal physical therapy, dry needling, home exercise regimen, rest and use of medications. Patients clinical presentation and exam are consistent with myofascial strain. She has tenderness and multiple palpable trigger points noted to right peri scapular region, levator scapulae and trapezius regions. Other differentials include cervical radiculopathy, however her pain today does not seem to be radicular in nature. There is foraminal stenosis at C3-C4 and C5-C6 on CT of cervical spine from 2024, at some point could look at diagnostic cervical epidural steroid injection. We discussed treatment plan today. Patient does have multiple allergies/sensitivities to medications, I prescribed Baclofen and Meloxicam for her to try. I also placed order for her to re-group with our in house PT team for short course of treatments. I would like to see her back in 8 weeks for re-evaluation. Should her pain continue would look closer at cervical epidural steroid injection. Patient has no questions at this time. No red flag symptoms noted upon exam today.     Meds & Orders:  Meds ordered this encounter  Medications   meloxicam (MOBIC) 15 MG tablet    Sig: Take 1 tablet (15 mg total) by mouth daily.    Dispense:  30 tablet    Refill:  0   baclofen (LIORESAL) 10 MG tablet    Sig: Take 1 tablet (10 mg total) by mouth 3 (three) times daily.    Dispense:  30 each    Refill:  0    Orders Placed This Encounter  Procedures   Small Joint Inj   Ambulatory referral to Physical Therapy    Follow-up: Return for 8 week follow up for re-evaluation.   Procedures: Small  Joint Inj on 01/03/2024 9:18 AM Indications: pain Details: 25 G needle, posterior approach Medications: 30 mg ketorolac 30 MG/ML Outcome: tolerated well, no immediate complications  Intramuscular injection to right deltoid.  Consent was given by the patient. Immediately prior to procedure a time out was called to verify the correct patient, procedure, equipment, support staff and site/side marked as required. Patient was prepped and draped in the usual sterile fashion.          Clinical History: CT cervical spine without contrast.  COMPARISON: None.  INDICATION: Increasing headaches. Recent posterior head trauma.  TECHNIQUE: Dose lowering technique(s) such as automated exposure control, iterative reconstruction, and mA and/or kV adjustment for patient size was utilized for this examination.  FINDINGS:  CT HEAD:  Normal overall brain volume for age.  No midline shift or hydrocephalus.  No hemorrhages or skull fractures or acute infarctions.  Visualized paranasal sinuses and mastoid air cells clear.  CT cervical spine:  Mild rightward scoliosis mid to lower cervical spine.  No acute fractures or subluxations.  Multilevel spondylosis with moderate right neural foraminal narrowing C3-4. Moderate to severe left neural foraminal narrowing C5-6.  Mild spinal stenosis.  No thickening of prevertebral soft tissues.  Biapical scarring.  IMPRESSION:  No acute intracranial process.  Cervical spondylosis. No acute fracture or subluxation.   Dictated on: 01/25/2023 3:44 PM Signed by: Theophilus Kinds, M.D.  01/25/2023 3:54 PM Exam End: 01/25/23 14:56 Last Resulted: 01/25/23 15:54 Received From: [facility]   She reports that she has never smoked. She has never been exposed to tobacco smoke. She has never used smokeless tobacco.  Recent Labs    09/17/23 0943  HGBA1C 5.7    Objective:  VS:  HT:    WT:   BMI:     BP:   HR: bpm  TEMP: ( )  RESP:  Physical Exam Vitals  and nursing note reviewed.  HENT:     Head: Normocephalic and atraumatic.     Right Ear: External ear normal.     Left Ear: External ear normal.     Nose: Nose normal.     Mouth/Throat:     Mouth: Mucous membranes are moist.  Eyes:     Extraocular Movements: Extraocular movements intact.  Cardiovascular:     Rate and Rhythm: Normal rate.     Pulses: Normal pulses.  Pulmonary:     Effort: Pulmonary effort is normal.  Abdominal:     General: Abdomen is flat. There is distension.  Musculoskeletal:        General: Tenderness present.     Cervical back: Tenderness present.     Comments: No discomfort noted with flexion, extension and side-to-side rotation. Patient has good strength in the upper extremities including 5 out of 5 strength in wrist extension, long finger flexion and APB. Shoulder range of motion is full bilaterally without any sign of impingement. There is no atrophy of the hands intrinsically. Sensation intact bilaterally. Tenderness noted to right peri scapular, levator scapulae and trapezius  regions. Negative Hoffman's sign. Negative Spurling's sign.     Skin:    General: Skin is warm and dry.     Capillary Refill: Capillary refill takes less than 2 seconds.  Neurological:     General: No focal deficit present.     Mental Status: She is alert and oriented to person, place, and time.  Psychiatric:        Mood and Affect: Mood normal.        Behavior: Behavior normal.     Ortho Exam  Imaging: No results found.  Past Medical/Family/Surgical/Social History: Medications & Allergies reviewed per EMR, new medications updated. Patient Active Problem List   Diagnosis Date Noted   Cerumen impaction 11/10/2023   Current use of proton pump inhibitor 09/12/2023   Chronic migraine without aura, with intractable migraine, so stated, with status migrainosus 07/29/2023   Migraine without aura and without status migrainosus, not intractable 07/29/2023   Thoracic back pain  01/21/2023   Pain in right shoulder 08/12/2022   Raynaud's disease 03/17/2022   Iron deficiency anemia 09/08/2020   Common bile duct dilatation 09/06/2020   History of breast cancer 03/01/2020   Genetic testing 11/14/2019   Family history of prostate cancer    Family history of breast cancer    Malignant neoplasm of upper-inner quadrant of right breast in female, estrogen receptor positive (HCC) 09/20/2018   B12 deficiency 04/13/2018   Joint pain 04/11/2018   Colon cancer screening 09/16/2017   Prediabetes 07/28/2016   Allergic rhinitis 11/13/2014   Hyperlipidemia, mild 09/26/2014   Routine general medical examination at a health care facility 10/28/2012   GERD 09/16/2010   Hypothyroidism 05/07/2008   Osteoporosis 05/07/2008   Migraine with aura 11/11/2007   Past Medical History:  Diagnosis Date   Allergic rhinitis    Allergy    seasonal   Anemia    Anxiety    Arthritis    hands, knees   Asthma    "as a child"   Blood transfusion without reported diagnosis    "as a child"   Breast cancer (HCC) 2019   Right Breast Cancer   Cancer (HCC) 09/2018   Breast CA new DX, right breast   Cervical dysplasia    Common bile duct dilation    Diabetes mellitus without complication (HCC)    Endometriosis    Esophageal reflux    Family history of adverse reaction to anesthesia    younger sister anxiety for a dew days after   Family history of breast cancer    Family history of breast cancer    Family history of prostate cancer    Hepatic cyst 08/2020   multiple   Hepatic hemangioma 08/2020   intrahepatic hemangioma   Hiatal hernia    HSV-1 (herpes simplex virus 1) infection    Hyperlipidemia    Hypertension    pt.denies 10/19/22   Hypothyroid    Migraine with aura, without mention of intractable migraine without mention of status migrainosus    Osteopenia 08/2019   T score -2.2 FRAX 11% / 1.7%   Osteoporosis 05/07/2008   Qualifier: Diagnosis of  By: Milinda Antis MD, Colon Flattery     Personal history of radiation therapy 2019   Right Breast Cancer   Pre-diabetes    Raynaud's disease    Family History  Problem Relation Age of Onset   Breast cancer Mother 42   Diabetes Mother    Irritable bowel syndrome Mother    Cancer Mother  Hearing loss Mother    Hyperlipidemia Mother    Miscarriages / Stillbirths Mother    Hypertension Father    Heart disease Father    Hypertension Sister    Anuerysm Sister        head   Depression Sister    Irritable bowel syndrome Sister    Hypertension Sister    Anxiety disorder Sister    Other Sister        prediabetes   Celiac disease Brother    Diabetes Brother    Osteoporosis Maternal Grandmother    Diabetes Maternal Grandmother    Arthritis Maternal Grandmother    COPD Paternal Grandmother    Heart disease Paternal Grandfather    Breast cancer Cousin 73       pat first cousin   Breast cancer Cousin        mat second cousin with breast cancer in her 30s   Thyroid cancer Cousin 33       pat first cousin   Prostate cancer Other 11   Colon cancer Neg Hx    Stomach cancer Neg Hx    Esophageal cancer Neg Hx    Pancreatic cancer Neg Hx    Rectal cancer Neg Hx    Colon polyps Neg Hx    Crohn's disease Neg Hx    Ulcerative colitis Neg Hx    Past Surgical History:  Procedure Laterality Date   ABDOMINAL HYSTERECTOMY     BIOPSY  09/12/2020   Procedure: BIOPSY;  Surgeon: Rachael Fee, MD;  Location: [facility];  Service: Endoscopy;;   BREAST BIOPSY Left 11/27/2022   MM LT BREAST BX W LOC DEV 1ST LESION IMAGE BX SPEC STEREO GUIDE 11/27/2022 [facility]   BREAST EXCISIONAL BIOPSY Bilateral over 10 years ago   benign x 2    BREAST LUMPECTOMY Right 10/10/2018   BREAST LUMPECTOMY WITH RADIOACTIVE SEED AND SENTINEL LYMPH NODE BIOPSY Right 10/10/2018   Procedure: RIGHT BREAST LUMPECTOMY WITH RADIOACTIVE SEED AND RIGHT SENTINEL LYMPH NODE BIOPSY;  Surgeon: Chevis Pretty III, MD;  Location: [facility];   Service: General;  Laterality: Right;   BREAST SURGERY     Benign breast lump excised   CERVICAL CONE BIOPSY  1985   severe dysplasia   COLONOSCOPY  2005 and dec 2019   normal   COLPOSCOPY     endoscopy  10/2018   ESOPHAGOGASTRODUODENOSCOPY (EGD) WITH PROPOFOL N/A 09/12/2020   Procedure: ESOPHAGOGASTRODUODENOSCOPY (EGD) WITH PROPOFOL;  Surgeon: Rachael Fee, MD;  Location: [facility];  Service: Endoscopy;  Laterality: N/A;   EUS N/A 09/12/2020   Procedure: UPPER ENDOSCOPIC ULTRASOUND (EUS) RADIAL;  Surgeon: Rachael Fee, MD;  Location: [facility];  Service: Endoscopy;  Laterality: N/A;   LAPAROSCOPIC ASSISTED VAGINAL HYSTERECTOMY     LAPAROSCOPIC BILATERAL SALPINGO OOPHERECTOMY Bilateral 10/27/2019   Procedure: LAPAROSCOPIC BILATERAL SALPINGO OOPHORECTOMY WITH PERITONEAL WASHINGS;  Surgeon: Genia Del, MD;  Location: [facility];  Service: Gynecology;  Laterality: Bilateral;  request 1:00pm on Friday, Dec. 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

## 2024-01-04 ENCOUNTER — Encounter: Payer: Self-pay | Admitting: Neurology

## 2024-01-04 NOTE — Telephone Encounter (Signed)
Holly w/ [facility] states the patient called her.

## 2024-01-05 DIAGNOSIS — G43711 Chronic migraine without aura, intractable, with status migrainosus: Secondary | ICD-10-CM | POA: Diagnosis not present

## 2024-01-05 MED ORDER — KETOROLAC TROMETHAMINE 30 MG/ML IJ SOLN
30.0000 mg | INTRAMUSCULAR | Status: AC | PRN
  Administered 2024-01-03: 30 mg via INTRAMUSCULAR

## 2024-01-10 ENCOUNTER — Other Ambulatory Visit: Payer: Self-pay

## 2024-01-10 ENCOUNTER — Ambulatory Visit: Payer: HMO

## 2024-01-10 ENCOUNTER — Encounter (HOSPITAL_BASED_OUTPATIENT_CLINIC_OR_DEPARTMENT_OTHER): Payer: Self-pay | Admitting: *Deleted

## 2024-01-10 ENCOUNTER — Emergency Department (HOSPITAL_BASED_OUTPATIENT_CLINIC_OR_DEPARTMENT_OTHER): Payer: HMO | Admitting: Radiology

## 2024-01-10 ENCOUNTER — Encounter: Payer: Self-pay | Admitting: Neurology

## 2024-01-10 ENCOUNTER — Emergency Department (HOSPITAL_BASED_OUTPATIENT_CLINIC_OR_DEPARTMENT_OTHER)
Admission: EM | Admit: 2024-01-10 | Discharge: 2024-01-10 | Disposition: A | Discharge status: Home / Self Care | Payer: HMO | Attending: Emergency Medicine | Admitting: Emergency Medicine

## 2024-01-10 DIAGNOSIS — G43909 Migraine, unspecified, not intractable, without status migrainosus: Secondary | ICD-10-CM | POA: Insufficient documentation

## 2024-01-10 DIAGNOSIS — R519 Headache, unspecified: Secondary | ICD-10-CM | POA: Diagnosis present

## 2024-01-10 DIAGNOSIS — R0789 Other chest pain: Secondary | ICD-10-CM | POA: Diagnosis not present

## 2024-01-10 DIAGNOSIS — R079 Chest pain, unspecified: Secondary | ICD-10-CM | POA: Diagnosis not present

## 2024-01-10 LAB — CBC
HCT: 37.3 % (ref 36.0–46.0)
Hemoglobin: 13 g/dL (ref 12.0–15.0)
MCH: 30.7 pg (ref 26.0–34.0)
MCHC: 34.9 g/dL (ref 30.0–36.0)
MCV: 88 fL (ref 80.0–100.0)
Platelets: 331 10*3/uL (ref 150–400)
RBC: 4.24 MIL/uL (ref 3.87–5.11)
RDW: 12 % (ref 11.5–15.5)
WBC: 5.5 10*3/uL (ref 4.0–10.5)
nRBC: 0 % (ref 0.0–0.2)

## 2024-01-10 LAB — BASIC METABOLIC PANEL
Anion gap: 8 (ref 5–15)
BUN: 18 mg/dL (ref 8–23)
CO2: 26 mmol/L (ref 22–32)
Calcium: 9.3 mg/dL (ref 8.9–10.3)
Chloride: 100 mmol/L (ref 98–111)
Creatinine, Ser: 0.57 mg/dL (ref 0.44–1.00)
GFR, Estimated: >60 mL/min (ref >60)
Glucose, Bld: 107 mg/dL — ABNORMAL HIGH (ref 70–99)
Potassium: 4 mmol/L (ref 3.5–5.1)
Sodium: 134 mmol/L — ABNORMAL LOW (ref 135–145)

## 2024-01-10 LAB — TROPONIN I (HIGH SENSITIVITY): Troponin I (High Sensitivity): <2 ng/L (ref <18)

## 2024-01-10 MED ORDER — KETOROLAC TROMETHAMINE 15 MG/ML IJ SOLN
15.0000 mg | Freq: Once | INTRAMUSCULAR | Status: AC
  Administered 2024-01-10: 15 mg via INTRAVENOUS
  Filled 2024-01-10: qty 1

## 2024-01-10 MED ORDER — ONDANSETRON HCL 4 MG/2ML IJ SOLN
4.0000 mg | Freq: Once | INTRAMUSCULAR | Status: AC
  Administered 2024-01-10: 4 mg via INTRAVENOUS
  Filled 2024-01-10: qty 2

## 2024-01-10 MED ORDER — DEXAMETHASONE SODIUM PHOSPHATE 10 MG/ML IJ SOLN
10.0000 mg | Freq: Once | INTRAMUSCULAR | Status: AC
  Administered 2024-01-10: 10 mg via INTRAVENOUS
  Filled 2024-01-10: qty 1

## 2024-01-10 NOTE — Discharge Instructions (Signed)
Please read and follow all provided instructions.  Your diagnoses today include:  1. Migraine without status migrainosus, not intractable, unspecified migraine type     Tests performed today include: Chest x-ray: No concerning findings Complete blood cell count: No concerning findings Basic metabolic panel: No concerning findings Cardiac enzymes (blood test looking for stress on the heart): Was normal EKG: No concerning findings Vital signs. See below for your results today.   Medications:  In the Emergency Department you received: Zofran - antinausea/headache medication Dexamethasone -steroid medication for headache Toradol - NSAID medication similar to ibuprofen  Take any prescribed medications only as directed.  Additional information:  Follow any educational materials contained in this packet.  You are having a headache. No specific cause was found today for your headache. It may have been a migraine or other cause of headache. Stress, anxiety, fatigue, and depression are common triggers for headaches.   Your headache today does not appear to be life-threatening or require hospitalization, but often the exact cause of headaches is not determined in the emergency department. Therefore, follow-up with your doctor is very important to find out what may have caused your headache and whether or not you need any further diagnostic testing or treatment.   Sometimes headaches can appear benign (not harmful), but then more serious symptoms can develop which should prompt an immediate re-evaluation by your doctor or the emergency department.  BE VERY CAREFUL not to take multiple medicines containing Tylenol (also called acetaminophen). Doing so can lead to an overdose which can damage your liver and cause liver failure and possibly death.   Follow-up instructions: Please follow-up with your primary care provider in the next 3 days for further evaluation of your symptoms.   Return  instructions:  Please return to the Emergency Department if you experience worsening symptoms. Return if the medications do not resolve your headache, if it recurs, or if you have multiple episodes of vomiting or cannot keep down fluids. Return if you have a change from the usual headache. RETURN IMMEDIATELY IF you: Develop a sudden, severe headache Develop confusion or become poorly responsive or faint Develop a fever above 100.29F or problem breathing Have a change in speech, vision, swallowing, or understanding Develop new weakness, numbness, tingling, incoordination in your arms or legs Have a seizure Please return if you have any other emergent concerns.  Additional Information:  Your vital signs today were: BP 98/70   Pulse 66   Temp 98.4 F (36.9 C) (Oral)   Resp 16   SpO2 100%  If your blood pressure (BP) was elevated above 135/85 this visit, please have this repeated by your doctor within one month. --------------

## 2024-01-10 NOTE — ED Triage Notes (Addendum)
Pt with hx of migraines is here for migraine which feels like her typical migraines.  Pt is seen by [facility] for her migraines.  Pt is reporting pain 4/10 with sensitivity to light and sound. Pt states that she was feeling anxious about the unbreakable migraine and had some tightness in her chest for the past few days.

## 2024-01-10 NOTE — ED Provider Notes (Signed)
[place] EMERGENCY DEPARTMENT AT [facility] Provider Note   CSN: [id number] Arrival date & time: 01/10/24  1253     History  Chief Complaint  Patient presents with   Headache    Jennifer [name] is a 68 y.o. female.  Patient with history of chronic migraine headaches, on outpatient infusions --presents to the emergency department today for recurrent headaches over the past 5 days.  These are her typical migraine type headaches.  She has associated light sensitivity and nausea.  Typically she will get good results at home with triptans, however headache has been returning.  She denies falls or injuries.  No fevers.  No URI symptoms.  She typically gets good improvement in the ED with Zofran, dexamethasone, Toradol.  She is requesting this cocktail today.  She did report some tightness in the chest on arrival.  She was unsure if this was reflux.  No shortness of breath.       Home Medications Prior to Admission medications   Medication Sig Start Date End Date Taking? Authorizing Provider  ALPRAZolam (XANAX) 0.5 MG tablet TAKE 1/2 TO 1 TABLET BY MOUTH ONCE DAILY AS NEEDED FOR SLEEP/ANXIETY 10/29/23   Tower, Audrie Gallus, MD  atenolol (TENORMIN) 25 MG tablet Take 12.5 mg by mouth 3 (three) times daily. For migraine prevention    [provider]  B Complex Vitamins (B COMPLEX PO) Take by mouth.    [provider]  baclofen (LIORESAL) 10 MG tablet Take 1 tablet (10 mg total) by mouth 3 (three) times daily. 01/03/24   Juanda Chance, NP  CALCIUM CARBONATE-VITAMIN D PO Take 1 tablet by mouth daily. 1000 mg    [provider]  famotidine (PEPCID) 40 MG tablet Take 40 mg by mouth 2 (two) times daily.     [provider]  Galcanezumab-gnlm (EMGALITY) 120 MG/ML SOAJ Inject 120 mg into the skin every 28 (twenty-eight) days. 10/19/23   Anson Fret, MD  hydrocortisone 2.5 % cream Apply to affected areas BID PRN as directed. 10/23/20   Moye, [place], MD   hydrOXYzine (ATARAX) 25 MG tablet Take 37.5 mg by mouth at bedtime as needed.    [provider]  ketorolac (TORADOL) 10 MG tablet Take by mouth.    [provider]  letrozole (FEMARA) 2.5 MG tablet Take 1 tablet (2.5 mg total) by mouth daily. 08/10/23   Serena Croissant, MD  levocetirizine (XYZAL) 5 MG tablet Take 5 mg by mouth at bedtime.  10/28/17   [provider]  levothyroxine (SYNTHROID) 75 MCG tablet TAKE 1 TABLET BY MOUTH EVERY DAY BEFORE BREAKFAST 12/06/23   Tower, [place] A, MD  magnesium oxide (MAG-OX) 400 MG tablet Take by mouth.    [provider]  Magnesium Oxide 400 (240 Mg) MG TABS Take 400 mg by mouth daily.     [provider]  meloxicam (MOBIC) 15 MG tablet Take 1 tablet (15 mg total) by mouth daily. 01/03/24 01/02/25  Juanda Chance, NP  metFORMIN (GLUCOPHAGE-XR) 500 MG 24 hr tablet Take 1 tablet (500 mg total) by mouth daily with breakfast. 10/01/23   Tower, Audrie Gallus, MD  Multiple Vitamin (MULTIVITAMIN WITH MINERALS) TABS tablet Take 1 tablet by mouth daily.    [provider]  nabumetone (RELAFEN) 500 MG tablet Take 500 mg by mouth 2 (two) times daily. 09/03/22   [provider]  neomycin-polymyxin b-dexamethasone (MAXITROL) 3.5-10000-0.1 SUSP 1 drop 3 (three) times daily. 08/27/22   [provider]  Omega-3 Fatty Acids (FISH OIL) 1000 MG CAPS Take 1,000 mg by mouth daily.     [provider]  OnabotulinumtoxinA (BOTOX IJ) Inject 1 Dose as directed every 6 (six) weeks.     [provider]  ondansetron (ZOFRAN ODT) 4 MG disintegrating tablet Take 1 tablet (4 mg total) by mouth every 8 (eight) hours as needed. 10/08/20   Don Perking, [place], MD  pantoprazole (PROTONIX) 20 MG tablet TAKE 1 TABLET BY MOUTH EVERY DAY 11/15/23   Zehr, Shanda Bumps D, PA-C  PRESCRIPTION MEDICATION Inject into the skin every 6 (six) weeks. Steroid Trigger    [provider]  Probiotic Product (PROBIOTIC-10 PO) Take 1  capsule by mouth daily.     [provider]  promethazine (PHENERGAN) 25 MG tablet Take 25 mg by mouth daily as needed. 10/12/22   [provider]  rizatriptan (MAXALT) 10 MG tablet Take 1 tablet (10 mg total) by mouth once as needed for up to 10 days for migraine. May repeat in 2 hours if needed 05/28/23 07/27/26  Jene Every, MD  triamcinolone cream (KENALOG) 0.1 % Apply to affected areas BID PRN for up to two weeks at a time as directed. 08/02/23   Elie Goody, MD  TURMERIC PO Take 1 tablet by mouth daily.    [provider]      Allergies    Ciprofloxacin, Codeine phosphate, Decongestant [pseudoephedrine], Diclofenac, Diphenhydramine hcl (sleep), Flonase [fluticasone propionate], Fluticasone, Hydrocodone, Macrobid [nitrofurantoin], Nasal spray, Nasonex [mometasone], Reglan [metoclopramide], Topamax [topiramate], Benadryl [diphenhydramine hcl], and Sulfa antibiotics    Review of Systems   Review of Systems  Physical Exam Updated Vital Signs BP 137/74   Pulse 65   Temp 98.3 F (36.8 C) (Oral)   Resp 15   SpO2 100%   Physical Exam Vitals and nursing note reviewed.  Constitutional:      Appearance: She is well-developed.  HENT:     Head: Normocephalic and atraumatic.     Right Ear: Tympanic membrane, ear canal and external ear normal.     Left Ear: Tympanic membrane, ear canal and external ear normal.     Nose: Nose normal.     Mouth/Throat:     Pharynx: Uvula midline.  Eyes:     General: Lids are normal.     Extraocular Movements:     Right eye: No nystagmus.     Left eye: No nystagmus.     Conjunctiva/sclera: Conjunctivae normal.     Pupils: Pupils are equal, round, and reactive to light.  Cardiovascular:     Rate and Rhythm: Normal rate and regular rhythm.  Pulmonary:     Effort: Pulmonary effort is normal.     Breath sounds: Normal breath sounds.  Abdominal:     Palpations: Abdomen is soft.     Tenderness: There is no abdominal  tenderness.  Musculoskeletal:     Cervical back: Normal range of motion and neck supple. No tenderness or bony tenderness.  Skin:    General: Skin is warm and dry.  Neurological:     Mental Status: She is alert and oriented to person, place, and time.     GCS: GCS eye subscore is 4. GCS verbal subscore is 5. GCS motor subscore is 6.     Cranial Nerves: No cranial nerve deficit.     Sensory: No sensory deficit.     Motor: No weakness.     Coordination: Coordination normal.     Comments: Lower extremity myotomes  tested bilaterally: L2 Hip flexion 5/5 L3 Knee extension 5/5 L4 Ankle dorsiflexion 5/5 S1 Ankle plantar flexion 5/5      ED Results / Procedures / Treatments   Labs (all labs ordered are listed, but only abnormal results are displayed) Labs Reviewed  BASIC METABOLIC PANEL - Abnormal; Notable for the following components:      Result Value   Sodium 134 (*)    Glucose, Bld 107 (*)    All other components within normal limits  CBC  TROPONIN I (HIGH SENSITIVITY)  TROPONIN I (HIGH SENSITIVITY)    EKG None  Radiology DG Chest 2 View Result Date: 01/10/2024 CLINICAL DATA:  Chest pain. EXAM: CHEST - 2 VIEW COMPARISON:  12/29/2022. FINDINGS: Bilateral lung fields are clear. Bilateral costophrenic angles are clear. Normal cardio-mediastinal silhouette. No acute osseous abnormalities. The soft tissues are within normal limits. IMPRESSION: No active cardiopulmonary disease. Electronically Signed   By: Jules Schick M.D.   On: 01/10/2024 15:49    Procedures Procedures    Medications Ordered in ED Medications  ondansetron (ZOFRAN) injection 4 mg (has no administration in time range)  ketorolac (TORADOL) 15 MG/ML injection 15 mg (has no administration in time range)  dexamethasone (DECADRON) injection 10 mg (has no administration in time range)    ED Course/ Medical Decision Making/ A&P    Patient seen and examined. History obtained directly from patient. Work-up  including labs, imaging, EKG ordered in triage, if performed, were reviewed.    Labs/EKG: Independently reviewed and interpreted.  This included: CBC unremarkable; BMP unremarkable; troponin less than 2.   Imaging: Independently visualized and interpreted.  This included: Chest x-ray agree negative  Medications/Fluids: Ordered: IV Zofran, IV dexamethasone, IV Toradol  Most recent vital signs reviewed and are as follows: BP 137/74   Pulse 65   Temp 98.3 F (36.8 C) (Oral)   Resp 15   SpO2 100%   Initial impression: Recurrent migraine without focal neurologic deficits.  Cardiac workup negative, exam very atypical, low concern for PE or ACS.  7:00 PM Reassessment performed. Patient appears improved, states symptoms resolved.  She is requesting discharge.  Most current vital signs reviewed and are as follows: BP 112/71   Pulse (!) 59   Temp 98.4 F (36.9 C) (Oral)   Resp 16   SpO2 100%   Plan: Discharge to home.   Prescriptions written for: None  Other home care instructions discussed: Rest, avoidance of triggers  ED return instructions discussed: Patient counseled to return if they have weakness in their arms or legs, slurred speech, trouble walking or talking, confusion, trouble with their balance, or if they have any other concerns. Patient verbalizes understanding and agrees with plan.   Follow-up instructions discussed: Patient encouraged to follow-up with their PCP/neurologist as needed.                               Medical Decision Making Amount and/or Complexity of Data Reviewed Labs: ordered. Radiology: ordered.  Risk Prescription drug management.   In regards to the patient's headache, critical differentials were considered including subarachnoid hemorrhage, intracerebral hemorrhage, epidural/subdural hematoma, pituitary apoplexy, vertebral/carotid artery dissection, giant cell arteritis, central venous thrombosis, reversible cerebral vasoconstriction, acute  angle closure glaucoma, idiopathic intracranial hypertension, bacterial meningitis, viral encephalitis, carbon monoxide poisoning, posterior reversible encephalopathy syndrome, pre-eclampsia.   Reg flag symptoms related to these causes were considered including systemic symptoms (fever, weight loss), neurologic symptoms (confusion, mental  status change, vision change, associated seizure), acute or sudden "thunderclap" onset, patient age 36 or older with new or progressive headache, patient of any age with first headache or change in headache pattern, pregnant or postpartum status, history of HIV or other immunocompromise, history of cancer, headache occurring with exertion, associated neck or shoulder pain, associated traumatic injury, concurrent use of anticoagulation, family history of spontaneous SAH, and concurrent drug use.    Other benign, more common causes of headache were considered including migraine, tension-type headache, cluster headache, referred pain from other cause such as sinus infection, dental pain, trigeminal neuralgia.   On exam, patient has a reassuring neuro exam including baseline mental status, no significant neck pain or meningeal signs, no signs of severe infection or fever.   The patient's vital signs, pertinent lab work and imaging were reviewed and interpreted as discussed in the ED course. Hospitalization was considered for further testing, treatments, or serial exams/observation. However as patient is well-appearing, has a stable exam over the course of their evaluation, and reassuring studies today, I do not feel that they warrant admission at this time. This plan was discussed with the patient who verbalizes agreement and comfort with this plan and seems reliable and able to return to the Emergency Department with worsening or changing symptoms.          Final Clinical Impression(s) / ED Diagnoses Final diagnoses:  Migraine without status migrainosus, not  intractable, unspecified migraine type    Rx / DC Orders ED Discharge Orders     None         Renne Crigler, PA-C 01/10/24 Therisa Doyne, MD 01/12/24 [phone]

## 2024-01-17 ENCOUNTER — Ambulatory Visit: Payer: HMO | Admitting: Physical Therapy

## 2024-01-19 ENCOUNTER — Ambulatory Visit: Payer: HMO | Admitting: Rehabilitative and Restorative Service Providers"

## 2024-01-22 DIAGNOSIS — Z8639 Personal history of other endocrine, nutritional and metabolic disease: Secondary | ICD-10-CM | POA: Diagnosis not present

## 2024-01-22 DIAGNOSIS — K219 Gastro-esophageal reflux disease without esophagitis: Secondary | ICD-10-CM | POA: Diagnosis not present

## 2024-01-22 DIAGNOSIS — Z8669 Personal history of other diseases of the nervous system and sense organs: Secondary | ICD-10-CM | POA: Diagnosis not present

## 2024-01-22 DIAGNOSIS — R519 Headache, unspecified: Secondary | ICD-10-CM | POA: Diagnosis not present

## 2024-02-01 ENCOUNTER — Ambulatory Visit: Payer: HMO

## 2024-02-07 ENCOUNTER — Other Ambulatory Visit: Payer: Self-pay

## 2024-02-07 ENCOUNTER — Emergency Department (HOSPITAL_BASED_OUTPATIENT_CLINIC_OR_DEPARTMENT_OTHER)
Admission: EM | Admit: 2024-02-07 | Discharge: 2024-02-07 | Disposition: A | Discharge status: Home / Self Care | Attending: Emergency Medicine | Admitting: Emergency Medicine

## 2024-02-07 DIAGNOSIS — G43909 Migraine, unspecified, not intractable, without status migrainosus: Secondary | ICD-10-CM | POA: Diagnosis not present

## 2024-02-07 DIAGNOSIS — Z853 Personal history of malignant neoplasm of breast: Secondary | ICD-10-CM | POA: Diagnosis not present

## 2024-02-07 DIAGNOSIS — R519 Headache, unspecified: Secondary | ICD-10-CM | POA: Diagnosis not present

## 2024-02-07 DIAGNOSIS — E039 Hypothyroidism, unspecified: Secondary | ICD-10-CM | POA: Diagnosis not present

## 2024-02-07 DIAGNOSIS — Z7984 Long term (current) use of oral hypoglycemic drugs: Secondary | ICD-10-CM | POA: Insufficient documentation

## 2024-02-07 DIAGNOSIS — I10 Essential (primary) hypertension: Secondary | ICD-10-CM | POA: Diagnosis not present

## 2024-02-07 DIAGNOSIS — Z8669 Personal history of other diseases of the nervous system and sense organs: Secondary | ICD-10-CM | POA: Insufficient documentation

## 2024-02-07 DIAGNOSIS — J45909 Unspecified asthma, uncomplicated: Secondary | ICD-10-CM | POA: Diagnosis not present

## 2024-02-07 DIAGNOSIS — E119 Type 2 diabetes mellitus without complications: Secondary | ICD-10-CM | POA: Diagnosis not present

## 2024-02-07 MED ORDER — ONDANSETRON HCL 4 MG/2ML IJ SOLN
4.0000 mg | Freq: Once | INTRAMUSCULAR | Status: AC
  Administered 2024-02-07: 4 mg via INTRAVENOUS
  Filled 2024-02-07: qty 2

## 2024-02-07 MED ORDER — DEXAMETHASONE SODIUM PHOSPHATE 10 MG/ML IJ SOLN
10.0000 mg | Freq: Once | INTRAMUSCULAR | Status: AC
  Administered 2024-02-07: 10 mg via INTRAVENOUS
  Filled 2024-02-07: qty 1

## 2024-02-07 MED ORDER — LACTATED RINGERS IV BOLUS
500.0000 mL | Freq: Once | INTRAVENOUS | Status: AC
Start: 2024-02-07 — End: 2024-02-07
  Administered 2024-02-07: 500 mL via INTRAVENOUS

## 2024-02-07 MED ORDER — KETOROLAC TROMETHAMINE 30 MG/ML IJ SOLN
30.0000 mg | Freq: Once | INTRAMUSCULAR | Status: AC
  Administered 2024-02-07: 30 mg via INTRAVENOUS
  Filled 2024-02-07: qty 1

## 2024-02-07 NOTE — ED Triage Notes (Signed)
 Pt c/o migraine, hx of same. Seen by [facility] neuro for same. Sensitivity to light, sound, smell, "a little bit" of nausea. Advises today is 5th day; relieved by rizatriptan, but "comes right back." Advises she's taken nothing today

## 2024-02-07 NOTE — ED Provider Notes (Signed)
 [place] EMERGENCY DEPARTMENT AT [facility] Provider Note   CSN: [id number] Arrival date & time: 02/07/24  1317     History  No chief complaint on file.   Jennifer [name] is a 68 y.o. female.  HPI      68yo female with history of breast cancer and iron deficiency anemia on hormone therapy, DM, migraines who presents with concern for headache.  Reports the headache is typical of her migraines which have been present for years, not hormonally mediated and for which she sees Neurology. Had 7 ED visits last year and has had 3-4 this year so far. She takes several medications from her Neurologist including monthly and tryptan she has been taking for the last 4 days. For this episode, this is the 5th day of headache. It began slowly and worsened. Did not take her triptan this AM as she thought she would come here. She used to get steroid injections from neurologist but they were no longer covered by insurance. Reports decadron, toradol and zofran in ED typically help her feel better.   Has associated nausea, no vomiting. Is worse with bright lights and loud sounds.  Denies trauma, fever, congestion, cough. Denies numbness, weakness, difficulty talking or walking, visual changes or facial droop.    Past Medical History:  Diagnosis Date   Allergic rhinitis    Allergy    seasonal   Anemia    Anxiety    Arthritis    hands, knees   Asthma    "as a child"   Blood transfusion without reported diagnosis    "as a child"   Breast cancer (HCC) 2019   Right Breast Cancer   Cancer (HCC) 09/2018   Breast CA new DX, right breast   Cervical dysplasia    Common bile duct dilation    Diabetes mellitus without complication (HCC)    Endometriosis    Esophageal reflux    Family history of adverse reaction to anesthesia    younger sister anxiety for a dew days after   Family history of breast cancer    Family history of breast cancer    Family history of prostate cancer    Hepatic  cyst 08/2020   multiple   Hepatic hemangioma 08/2020   intrahepatic hemangioma   Hiatal hernia    HSV-1 (herpes simplex virus 1) infection    Hyperlipidemia    Hypertension    pt.denies 10/19/22   Hypothyroid    Migraine with aura, without mention of intractable migraine without mention of status migrainosus    Osteopenia 08/2019   T score -2.2 FRAX 11% / 1.7%   Osteoporosis 05/07/2008   Qualifier: Diagnosis of  By: Milinda Antis MD, Colon Flattery    Personal history of radiation therapy 2019   Right Breast Cancer   Pre-diabetes    Raynaud's disease     Home Medications Prior to Admission medications   Medication Sig Start Date End Date Taking? Authorizing Provider  ALPRAZolam (XANAX) 0.5 MG tablet TAKE 1/2 TO 1 TABLET BY MOUTH ONCE DAILY AS NEEDED FOR SLEEP/ANXIETY 10/29/23   Tower, Audrie Gallus, MD  atenolol (TENORMIN) 25 MG tablet Take 12.5 mg by mouth 3 (three) times daily. For migraine prevention    [provider]  B Complex Vitamins (B COMPLEX PO) Take by mouth.    [provider]  baclofen (LIORESAL) 10 MG tablet Take 1 tablet (10 mg total) by mouth 3 (three) times daily. 01/03/24   Juanda Chance, NP  CALCIUM CARBONATE-VITAMIN D PO Take 1 tablet by mouth daily. 1000 mg    [provider]  famotidine (PEPCID) 40 MG tablet Take 40 mg by mouth 2 (two) times daily.     [provider]  Galcanezumab-gnlm (EMGALITY) 120 MG/ML SOAJ Inject 120 mg into the skin every 28 (twenty-eight) days. 10/19/23   Anson Fret, MD  hydrocortisone 2.5 % cream Apply to affected areas BID PRN as directed. 10/23/20   Moye, [place], MD  hydrOXYzine (ATARAX) 25 MG tablet Take 37.5 mg by mouth at bedtime as needed.    [provider]  ketorolac (TORADOL) 10 MG tablet Take by mouth.    [provider]  letrozole (FEMARA) 2.5 MG tablet Take 1 tablet (2.5 mg total) by mouth daily. 08/10/23   Serena Croissant, MD  levocetirizine (XYZAL) 5 MG tablet Take 5 mg by mouth  at bedtime.  10/28/17   [provider]  levothyroxine (SYNTHROID) 75 MCG tablet TAKE 1 TABLET BY MOUTH EVERY DAY BEFORE BREAKFAST 12/06/23   Tower, [place] A, MD  magnesium oxide (MAG-OX) 400 MG tablet Take by mouth.    [provider]  Magnesium Oxide 400 (240 Mg) MG TABS Take 400 mg by mouth daily.     [provider]  meloxicam (MOBIC) 15 MG tablet Take 1 tablet (15 mg total) by mouth daily. 01/03/24 01/02/25  Juanda Chance, NP  metFORMIN (GLUCOPHAGE-XR) 500 MG 24 hr tablet Take 1 tablet (500 mg total) by mouth daily with breakfast. 10/01/23   Tower, Audrie Gallus, MD  Multiple Vitamin (MULTIVITAMIN WITH MINERALS) TABS tablet Take 1 tablet by mouth daily.    [provider]  nabumetone (RELAFEN) 500 MG tablet Take 500 mg by mouth 2 (two) times daily. 09/03/22   [provider]  neomycin-polymyxin b-dexamethasone (MAXITROL) 3.5-10000-0.1 SUSP 1 drop 3 (three) times daily. 08/27/22   [provider]  Omega-3 Fatty Acids (FISH OIL) 1000 MG CAPS Take 1,000 mg by mouth daily.     [provider]  OnabotulinumtoxinA (BOTOX IJ) Inject 1 Dose as directed every 6 (six) weeks.     [provider]  ondansetron (ZOFRAN ODT) 4 MG disintegrating tablet Take 1 tablet (4 mg total) by mouth every 8 (eight) hours as needed. 10/08/20   Don Perking, [place], MD  pantoprazole (PROTONIX) 20 MG tablet TAKE 1 TABLET BY MOUTH EVERY DAY 11/15/23   Zehr, Shanda Bumps D, PA-C  PRESCRIPTION MEDICATION Inject into the skin every 6 (six) weeks. Steroid Trigger    [provider]  Probiotic Product (PROBIOTIC-10 PO) Take 1 capsule by mouth daily.     [provider]  promethazine (PHENERGAN) 25 MG tablet Take 25 mg by mouth daily as needed. 10/12/22   [provider]  rizatriptan (MAXALT) 10 MG tablet Take 1 tablet (10 mg total) by mouth once as needed for up to 10 days for migraine. May repeat in 2 hours if needed 05/28/23 07/27/26  Jene Every,  MD  triamcinolone cream (KENALOG) 0.1 % Apply to affected areas BID PRN for up to two weeks at a time as directed. 08/02/23   Elie Goody, MD  TURMERIC PO Take 1 tablet by mouth daily.    [provider]      Allergies    Ciprofloxacin, Codeine phosphate, Decongestant [pseudoephedrine], Diclofenac, Diphenhydramine hcl (sleep), Flonase [fluticasone propionate], Fluticasone, Hydrocodone, Macrobid [nitrofurantoin], Nasal spray, Nasonex [mometasone], Reglan [metoclopramide], Topamax [topiramate], Benadryl [diphenhydramine hcl], and Sulfa antibiotics    Review of Systems  Review of Systems  Physical Exam Updated Vital Signs BP 138/86 (BP Location: Left Wrist)   Pulse (!) 54   Temp 98.2 F (36.8 C)   Resp 16   SpO2 100%  Physical Exam Vitals and nursing note reviewed.  Constitutional:      General: She is not in acute distress.    Appearance: Normal appearance. She is well-developed. She is not ill-appearing or diaphoretic.  HENT:     Head: Normocephalic and atraumatic.  Eyes:     General: No visual field deficit.    Extraocular Movements: Extraocular movements intact.     Conjunctiva/sclera: Conjunctivae normal.     Pupils: Pupils are equal, round, and reactive to light.  Cardiovascular:     Rate and Rhythm: Normal rate and regular rhythm.     Pulses: Normal pulses.  Pulmonary:     Effort: Pulmonary effort is normal. No respiratory distress.  Musculoskeletal:        General: No swelling or tenderness.     Cervical back: Normal range of motion.  Skin:    General: Skin is warm and dry.     Findings: No erythema or rash.  Neurological:     General: No focal deficit present.     Mental Status: She is alert and oriented to person, place, and time.     GCS: GCS eye subscore is 4. GCS verbal subscore is 5. GCS motor subscore is 6.     Cranial Nerves: No cranial nerve deficit, dysarthria or facial asymmetry.     Sensory: No sensory deficit.     Motor: No weakness or  tremor.     Coordination: Coordination normal. Finger-Nose-Finger Test normal.     ED Results / Procedures / Treatments   Labs (all labs ordered are listed, but only abnormal results are displayed) Labs Reviewed - No data to display  EKG None  Radiology No results found.  Procedures Procedures    Medications Ordered in ED Medications  dexamethasone (DECADRON) injection 10 mg (10 mg Intravenous Given 02/07/24 1729)  ketorolac (TORADOL) 30 MG/ML injection 30 mg (30 mg Intravenous Given 02/07/24 1728)  ondansetron (ZOFRAN) injection 4 mg (4 mg Intravenous Given 02/07/24 1727)  lactated ringers bolus 500 mL (0 mLs Intravenous Stopped 02/07/24 1759)    ED Course/ Medical Decision Making/ A&P                                   67yo female with history of breast cancer and iron deficiency anemia on hormone therapy, DM, migraines who presents with concern for headache.  No fever, no sign of meningitis. Lower suspicion for ICH, CVT, given headache the same as prior migraines.  Discussed with her hx of breast Ca and more frequent headaches over the past year, it is reasonable to get head imaging, however her neurologic exam is normal and have low suspicion for ICH and feel an MRI would be more sensitive and can be done as an outpatient.  She is feeling improved following headache cocktail with decadron, toradol, zofran and IV fluids. Planning on outpatient follow up with Neurology. Patient discharged in stable condition with understanding of reasons to return.          Final Clinical Impression(s) / ED Diagnoses Final diagnoses:  History of migraine  Acute nonintractable headache, unspecified headache type    Rx / DC Orders ED Discharge Orders     None  Alvira Monday, MD 02/07/24 1800

## 2024-02-14 ENCOUNTER — Encounter: Payer: Self-pay | Admitting: Hematology and Oncology

## 2024-02-14 ENCOUNTER — Telehealth: Payer: Self-pay

## 2024-02-14 ENCOUNTER — Other Ambulatory Visit (HOSPITAL_COMMUNITY): Payer: Self-pay

## 2024-02-14 NOTE — Telephone Encounter (Signed)
 Pharmacy Patient Advocate Encounter   Received notification from CoverMyMeds that prior authorization for Emgality 120MG /ML auto-injectors (migraine) is required/requested.   Insurance verification completed.   The patient is insured through [facility] ADVANTAGE/RX ADVANCE .   Per test claim: PA required; PA submitted to above mentioned insurance via CoverMyMeds Key/confirmation #/EOC [id number] Status is pending

## 2024-02-14 NOTE — Telephone Encounter (Signed)
 Rceived a faxed request for ad dinfo for emgality-faxed form along with clinicals to [phone]

## 2024-02-15 ENCOUNTER — Other Ambulatory Visit (HOSPITAL_COMMUNITY): Payer: Self-pay

## 2024-02-15 NOTE — Telephone Encounter (Signed)
 Pharmacy Patient Advocate Encounter  Received notification from [facility] ADVANTAGE/RX ADVANCE that Prior Authorization for Emgality 120MG /ML auto-injectors (migraine) has been APPROVED from 02/14/2024 to 08/22/2024. Ran test claim, Copay is $47.00. This test claim was processed through [facility]- copay amounts may vary at other pharmacies due to pharmacy/plan contracts, or as the patient moves through the different stages of their insurance plan.   PA #/Case ID/Reference #: [id number]

## 2024-02-16 ENCOUNTER — Encounter: Payer: Self-pay | Admitting: Physical Medicine and Rehabilitation

## 2024-02-16 ENCOUNTER — Ambulatory Visit: Admitting: Physical Medicine and Rehabilitation

## 2024-02-16 DIAGNOSIS — G8929 Other chronic pain: Secondary | ICD-10-CM | POA: Diagnosis not present

## 2024-02-16 DIAGNOSIS — M7918 Myalgia, other site: Secondary | ICD-10-CM

## 2024-02-16 DIAGNOSIS — M25511 Pain in right shoulder: Secondary | ICD-10-CM

## 2024-02-16 DIAGNOSIS — M898X1 Other specified disorders of bone, shoulder: Secondary | ICD-10-CM

## 2024-02-16 NOTE — Progress Notes (Unsigned)
 Pain Scale   Average Pain 2 Patient advising she was doing remodeling at home and now her shoulder to neck on right side is painful     +Driver, -BT, -Dye Allergies.

## 2024-02-16 NOTE — Progress Notes (Unsigned)
 Jennifer [name] - 68 y.o. female MRN [id number]  Date of birth: [birth year]  Office Visit Note: Visit Date: 02/16/2024 PCP: Judy Pimple, MD Referred by: Tower, Audrie Gallus, MD  Subjective: Chief Complaint  Patient presents with   Right Shoulder - Pain   HPI: Jennifer [name] is a 68 y.o. female who comes in today for evaluation of chronic, worsening and severe right scapular pain radiating to shoulder and upper arm, intermittent radiation of pain to head. She is complaining of more right shoulder pain today. Pain ongoing for several weeks. Her pain actually improved for several weeks, however her pain increased after cleaning her house and changing lightbulbs. History of formal physical therapy and dry needling, she reports significant relief of pain with these treatments. CT of cervical spine in March of 2024 from [facility] exhibits multi level spondylosis, moderate right sided foraminal narrowing on the right at C3-C4, severe left foraminal narrowing at C5-C6. No high grade spinal canal stenosis noted. History of bilateral rotator cuff repair with [facility]. She was evaluated by our colleague Dr. Norlene Campbell several years ago for right shoulder pain.  Right shoulder radiographs from 2023 show moderate degenerative joint disease of the right acromioclavicular and glenohumeral joints. She does have history of chronic migraine, currently managed by Dr. Naomie Dean with [facility]. She was recently diagnosed with osteoporosis, undergoing Prolia injections. Patient denies focal weakness, numbness and tingling. No recent trauma or falls.         Review of Systems  Musculoskeletal:  Positive for back pain, joint pain, myalgias and neck pain.  Neurological:  Negative for tingling, sensory change, focal weakness and weakness.  All other systems reviewed and are negative.  Otherwise per HPI.  Assessment & Plan: Visit Diagnoses:    ICD-10-CM   1. Chronic right shoulder pain  M25.511     G89.29     2. Chronic scapular pain  M89.8X1    G89.29     3. Myofascial pain syndrome  M79.18        Plan: Findings:  Chronic, worsening and severe pain radiating to shoulder and upper arm, intermittent radiation of pain to head. Right shoulder seems to be biggest pain generator today. Patients clinical presentation and exam are consistent with shoulder impingement syndrome. She does have pain with right shoulder abduction today. I also feel there is a myofascial component contributing to her pain as she does have myofascial tenderness to right peri scapular, levator scapulae and trapezius regions upon palpation today. Dr. Alvester Morin and myself feel it would be best to refer patient to our sports medicine physician Dr. Shon Baton. We would like him to further evaluate right shoulder issues and possibility look at ultrasound guided injection. I did perform intra-muscular Toradol injection in the office today, she tolerated well. I also encouraged patient to contact PT to set up appointments, I do feel she would benefit from manual treatments, possible dry needling and range of motion exercises. She has no questions at this time. No red flag symptoms noted. We are happy to see her back as needed.        Meds & Orders: No orders of the defined types were placed in this encounter.  No orders of the defined types were placed in this encounter.   Follow-up: Return for Follow up wtih Dr. Shon Baton.   Procedures: No procedures performed      Clinical History: CT cervical spine without contrast.  COMPARISON: None.  INDICATION: Increasing headaches. Recent posterior  head trauma.  TECHNIQUE: Dose lowering technique(s) such as automated exposure control, iterative reconstruction, and mA and/or kV adjustment for patient size was utilized for this examination.  FINDINGS:  CT HEAD:  Normal overall brain volume for age.  No midline shift or hydrocephalus.  No hemorrhages or skull fractures or acute  infarctions.  Visualized paranasal sinuses and mastoid air cells clear.  CT cervical spine:  Mild rightward scoliosis mid to lower cervical spine.  No acute fractures or subluxations.  Multilevel spondylosis with moderate right neural foraminal narrowing C3-4. Moderate to severe left neural foraminal narrowing C5-6.  Mild spinal stenosis.  No thickening of prevertebral soft tissues.  Biapical scarring.  IMPRESSION:  No acute intracranial process.  Cervical spondylosis. No acute fracture or subluxation.   Dictated on: 01/25/2023 3:44 PM Signed by: Theophilus Kinds, M.D.  01/25/2023 3:54 PM Exam End: 01/25/23 14:56 Last Resulted: 01/25/23 15:54 Received From: [facility]   She reports that she has never smoked. She has never been exposed to tobacco smoke. She has never used smokeless tobacco.  Recent Labs    09/17/23 0943  HGBA1C 5.7    Objective:  VS:  HT:    WT:   BMI:     BP:   HR: bpm  TEMP: ( )  RESP:  Physical Exam Vitals and nursing note reviewed.  HENT:     Head: Normocephalic and atraumatic.     Right Ear: External ear normal.     Left Ear: External ear normal.     Nose: Nose normal.     Mouth/Throat:     Mouth: Mucous membranes are moist.  Eyes:     Extraocular Movements: Extraocular movements intact.  Cardiovascular:     Rate and Rhythm: Normal rate.     Pulses: Normal pulses.  Pulmonary:     Effort: Pulmonary effort is normal.  Abdominal:     General: Abdomen is flat. There is no distension.  Musculoskeletal:        General: Tenderness present.     Cervical back: Normal range of motion.     Comments: No discomfort noted with flexion, extension and side-to-side rotation. Patient has good strength in the upper extremities including 5 out of 5 strength in wrist extension, long finger flexion and APB. Limited range of motion to right shoulder, pain with abduction. Full ROM noted to left shoulder. There is no atrophy of the hands intrinsically.  Sensation intact bilaterally. Myofascial tenderness to right peri scapular, levator scapulae and trapezius regions  Negative Hoffman's sign. Negative Spurling's sign.     Skin:    General: Skin is warm and dry.     Capillary Refill: Capillary refill takes less than 2 seconds.  Neurological:     General: No focal deficit present.     Mental Status: She is alert and oriented to person, place, and time.  Psychiatric:        Mood and Affect: Mood normal.        Behavior: Behavior normal.     Ortho Exam  Imaging: No results found.  Past Medical/Family/Surgical/Social History: Medications & Allergies reviewed per EMR, new medications updated. Patient Active Problem List   Diagnosis Date Noted   Cerumen impaction 11/10/2023   Current use of proton pump inhibitor 09/12/2023   Chronic migraine without aura, with intractable migraine, so stated, with status migrainosus 07/29/2023   Migraine without aura and without status migrainosus, not intractable 07/29/2023   Thoracic back pain 01/21/2023   Pain in right  shoulder 08/12/2022   Raynaud's disease 03/17/2022   Iron deficiency anemia 09/08/2020   Common bile duct dilatation 09/06/2020   History of breast cancer 03/01/2020   Genetic testing 11/14/2019   Family history of prostate cancer    Family history of breast cancer    Malignant neoplasm of upper-inner quadrant of right breast in female, estrogen receptor positive (HCC) 09/20/2018   B12 deficiency 04/13/2018   Joint pain 04/11/2018   Colon cancer screening 09/16/2017   Prediabetes 07/28/2016   Allergic rhinitis 11/13/2014   Hyperlipidemia, mild 09/26/2014   Routine general medical examination at a health care facility 10/28/2012   GERD 09/16/2010   Hypothyroidism 05/07/2008   Osteoporosis 05/07/2008   Migraine with aura 11/11/2007   Past Medical History:  Diagnosis Date   Allergic rhinitis    Allergy    seasonal   Anemia    Anxiety    Arthritis    hands, knees    Asthma    "as a child"   Blood transfusion without reported diagnosis    "as a child"   Breast cancer (HCC) 2019   Right Breast Cancer   Cancer (HCC) 09/2018   Breast CA new DX, right breast   Cervical dysplasia    Common bile duct dilation    Diabetes mellitus without complication (HCC)    Endometriosis    Esophageal reflux    Family history of adverse reaction to anesthesia    younger sister anxiety for a dew days after   Family history of breast cancer    Family history of breast cancer    Family history of prostate cancer    Hepatic cyst 08/2020   multiple   Hepatic hemangioma 08/2020   intrahepatic hemangioma   Hiatal hernia    HSV-1 (herpes simplex virus 1) infection    Hyperlipidemia    Hypertension    pt.denies 10/19/22   Hypothyroid    Migraine with aura, without mention of intractable migraine without mention of status migrainosus    Osteopenia 08/2019   T score -2.2 FRAX 11% / 1.7%   Osteoporosis 05/07/2008   Qualifier: Diagnosis of  By: Milinda Antis MD, Colon Flattery    Personal history of radiation therapy 2019   Right Breast Cancer   Pre-diabetes    Raynaud's disease    Family History  Problem Relation Age of Onset   Breast cancer Mother 54   Diabetes Mother    Irritable bowel syndrome Mother    Cancer Mother    Hearing loss Mother    Hyperlipidemia Mother    Miscarriages / Stillbirths Mother    Hypertension Father    Heart disease Father    Hypertension Sister    Anuerysm Sister        head   Depression Sister    Irritable bowel syndrome Sister    Hypertension Sister    Anxiety disorder Sister    Other Sister        prediabetes   Celiac disease Brother    Diabetes Brother    Osteoporosis Maternal Grandmother    Diabetes Maternal Grandmother    Arthritis Maternal Grandmother    COPD Paternal Grandmother    Heart disease Paternal Grandfather    Breast cancer Cousin 69       pat first cousin   Breast cancer Cousin        mat second cousin with  breast cancer in her 30s   Thyroid cancer Cousin 42  pat first cousin   Prostate cancer Other 21   Colon cancer Neg Hx    Stomach cancer Neg Hx    Esophageal cancer Neg Hx    Pancreatic cancer Neg Hx    Rectal cancer Neg Hx    Colon polyps Neg Hx    Crohn's disease Neg Hx    Ulcerative colitis Neg Hx    Past Surgical History:  Procedure Laterality Date   ABDOMINAL HYSTERECTOMY     BIOPSY  09/12/2020   Procedure: BIOPSY;  Surgeon: Rachael Fee, MD;  Location: [facility];  Service: Endoscopy;;   BREAST BIOPSY Left 11/27/2022   MM LT BREAST BX W LOC DEV 1ST LESION IMAGE BX SPEC STEREO GUIDE 11/27/2022 [facility]   BREAST EXCISIONAL BIOPSY Bilateral over 10 years ago   benign x 2    BREAST LUMPECTOMY Right 10/10/2018   BREAST LUMPECTOMY WITH RADIOACTIVE SEED AND SENTINEL LYMPH NODE BIOPSY Right 10/10/2018   Procedure: RIGHT BREAST LUMPECTOMY WITH RADIOACTIVE SEED AND RIGHT SENTINEL LYMPH NODE BIOPSY;  Surgeon: Chevis Pretty III, MD;  Location: [facility];  Service: General;  Laterality: Right;   BREAST SURGERY     Benign breast lump excised   CERVICAL CONE BIOPSY  1985   severe dysplasia   COLONOSCOPY  2005 and dec 2019   normal   COLPOSCOPY     endoscopy  10/2018   ESOPHAGOGASTRODUODENOSCOPY (EGD) WITH PROPOFOL N/A 09/12/2020   Procedure: ESOPHAGOGASTRODUODENOSCOPY (EGD) WITH PROPOFOL;  Surgeon: Rachael Fee, MD;  Location: [facility];  Service: Endoscopy;  Laterality: N/A;   EUS N/A 09/12/2020   Procedure: UPPER ENDOSCOPIC ULTRASOUND (EUS) RADIAL;  Surgeon: Rachael Fee, MD;  Location: [facility];  Service: Endoscopy;  Laterality: N/A;   LAPAROSCOPIC ASSISTED VAGINAL HYSTERECTOMY     LAPAROSCOPIC BILATERAL SALPINGO OOPHERECTOMY Bilateral 10/27/2019   Procedure: LAPAROSCOPIC BILATERAL SALPINGO OOPHORECTOMY WITH PERITONEAL WASHINGS;  Surgeon: Genia Del, MD;  Location: [facility];  Service: Gynecology;  Laterality:  Bilateral;  request 1:00pm on Friday, Dec. 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

## 2024-02-17 ENCOUNTER — Ambulatory Visit

## 2024-02-17 ENCOUNTER — Ambulatory Visit: Admitting: Physical Medicine and Rehabilitation

## 2024-02-19 LAB — SIGNATERA
SIGNATERA MTM READOUT: 0 MTM/ml
SIGNATERA TEST RESULT: NEGATIVE

## 2024-02-22 ENCOUNTER — Ambulatory Visit
Admission: RE | Admit: 2024-02-22 | Discharge: 2024-02-22 | Disposition: A | Discharge status: Home / Self Care | Source: Ambulatory Visit | Attending: Obstetrics and Gynecology | Admitting: Obstetrics and Gynecology

## 2024-02-22 ENCOUNTER — Telehealth: Payer: Self-pay | Admitting: Neurology

## 2024-02-22 DIAGNOSIS — Z1231 Encounter for screening mammogram for malignant neoplasm of breast: Secondary | ICD-10-CM | POA: Diagnosis not present

## 2024-02-22 NOTE — Progress Notes (Unsigned)
 Virtual Visit via Video Note  I connected with Jennifer [name] on 02/23/24 at 11:00 AM EDT by a video enabled telemedicine application and verified that I am speaking with the correct person using two identifiers.  Location: Patient: at her home Provider: in the office    I discussed the limitations of evaluation and management by telemedicine and the availability of in person appointments. The patient expressed understanding and agreed to proceed.  History of Present Illness: Update 02/23/24 SS: Here to be seen for Vyepti renewal. Last Botox was 12/21/23 with me.In the ER 3/17, 3/1/, 2/17 for migraine infusion. 1st Vyepti infusion was 01/04/23 100 mg dosing. Remains on Emgality.  10 migraines in Jan, 12 in Feb, March 10. Has had 4 ER visits this year. Previously was getting trigger point injections with Dr. Neale Burly. For rescue, takes Maxalt, it always works, but it can rebound. Toradol is last report 10 mg pill. Nurtec didn't work. Doesn't have any headache right now. When she has 4 days a of migraine, when she is on 5th day of migraine even low pain she goes to the ER, tries to avoid it getting to level 4-5. Migraines are not reaching severe level with current combination of medications.   Dr.Ahern 10/19/2023: She had 50% improvement with botox daily migraines and headaches. After botox she had 50% improvement to 15 migraine days a month and daily headaches and further improvement from [facility] but still having a burden of migraines , one migraine can last 3 days, triptan helps. Emgality has helped 50% but still have chronic migraines. 23rd of October Dr. Neale Burly does her botox for migraines every 3 months. She also gets trigger point injections. The week before she takes emgality she gets a little worse. Last emgality was on 10/01/2023. Take injection gaves her a sample she is in the doughnot hole. Told her she can take it every 28 days, changed script and if they fill 3-4 days early it fine to take  early if it is wearing off. Started Emgality August 6th so 4 months this should be close to maximum efficacy. She has had increasing migraines these 3 months but she has had a lot happen like invisiline for her teeth, she has had long work days and that triggered a migraine so she can point out reasons for it. Bascially gave a choice. Had 10 migraines in October and 8 this month it has been worse these three months. Emgality not working as well anymore having >8 migraine days a month and 15 headache days a month again.    Observations/Objective: Via VV  Assessment and Plan: Chronic migraine headache   -Increase Vyepti to 300 mg every 3 month injection for chronic migraine headache. 1st Vyepti dose was 01/04/23 with me 100 mg. Less severe migraines with current regimen, thus will try for higher dosing -Continue Botox for migraine prevention  -Stop the Emgality since on Vyepti  -Try Amerge 2.5 mg at onset of headache, can combine with Tylenol or Aleve for severe headache. Goal to try to prevent rebound headache from Maxalt.  -Next steps: Bernita Raisin  -Mentions Toradol injections at home, will check with Dr. Lucia Gaskins about this, creatinine 0.62  Follow Up Instructions: She wants to keep appointment with Dr. Lucia Gaskins in April VV   I discussed the assessment and treatment plan with the patient. The patient was provided an opportunity to ask questions and all were answered. The patient agreed with the plan and demonstrated an understanding of the instructions.  The patient was advised to call back or seek an in-person evaluation if the symptoms worsen or if the condition fails to improve as anticipated.  Otila Kluver, DNP  [facility] [address] [place], [place] [address] [phone]

## 2024-02-22 NOTE — Telephone Encounter (Signed)
 Pt said, need earlier appt due to insurance approval for Vyepti infusion per [place] with infusion team. Would like a call back.  Also would like to have a prescription for Toradol injection sent to [facility] [phone]

## 2024-02-22 NOTE — Telephone Encounter (Signed)
 Per Dr Rana Snare note 01-21-2024  I think we have a video follow up in April we can discuss. yes we can send in toradol injections but do you know how to inject them to yourself? At our follow up in April we can discuss.  Her appt is 03-15-2024.    Per pt she has had one vyepti infusion, which she feels may have helped some, but feels worth continuing to see if gets better benefit.  She has also asking for toradol injections.  She states she would not give to herself but knows nurses in her clinic, and other nurses that would be able to assist.  She was grateful that was able to get in tomorrow.  Will to VV with Maralyn Sago NP 02-23-2024 1100.

## 2024-02-23 ENCOUNTER — Telehealth: Admitting: Neurology

## 2024-02-23 DIAGNOSIS — G43711 Chronic migraine without aura, intractable, with status migrainosus: Secondary | ICD-10-CM

## 2024-02-23 MED ORDER — NARATRIPTAN HCL 2.5 MG PO TABS: 2.5000 mg | ORAL_TABLET | ORAL | 0 refills | Status: DC | PRN

## 2024-02-23 NOTE — Patient Instructions (Addendum)
 Increase Vyepti 300 mg every 3 months.  Continue Botox.  Stop Emgality since on Vyepti.  Try Amerge at onset of acute migraine headache may combine with Aleve or Tylenol for severe headache.

## 2024-02-25 ENCOUNTER — Encounter: Payer: Self-pay | Admitting: Neurology

## 2024-02-25 ENCOUNTER — Telehealth: Payer: Self-pay | Admitting: Neurology

## 2024-02-25 NOTE — Telephone Encounter (Signed)
 Ok that's fne, thanks

## 2024-02-25 NOTE — Telephone Encounter (Signed)
 Called and spoke with patient. Patient stated that she would really like to keep 4/23 appointment because it is so hard to get in with Dr. Lucia Gaskins and she had things she was looking forward to discussing such as her Toradol medication. I told patient that since she just had a medication change that Dr. Lucia Gaskins was probably wanting to see how that worked for her before seeing her again. However, she was insistent on keeping her appointment. I told her we can keep that appointment for now and I would let Dr. Lucia Gaskins know.

## 2024-02-25 NOTE — Telephone Encounter (Signed)
 I had a scheduled appointment later this month but she just saw Maralyn Sago and we just increased her vyepti, I let patient know I think we should wait longer before following up please call and reschedule for july thanks

## 2024-02-26 ENCOUNTER — Encounter: Payer: Self-pay | Admitting: Obstetrics and Gynecology

## 2024-02-28 ENCOUNTER — Encounter: Payer: Self-pay | Admitting: Family Medicine

## 2024-02-28 ENCOUNTER — Ambulatory Visit: Payer: HMO | Admitting: Physical Medicine and Rehabilitation

## 2024-02-28 ENCOUNTER — Ambulatory Visit (INDEPENDENT_AMBULATORY_CARE_PROVIDER_SITE_OTHER): Admitting: Family Medicine

## 2024-02-28 ENCOUNTER — Ambulatory Visit: Payer: Self-pay

## 2024-02-28 VITALS — BP 120/61 | HR 69 | Temp 97.9°F | Ht 59.0 in | Wt 128.0 lb

## 2024-02-28 DIAGNOSIS — N390 Urinary tract infection, site not specified: Secondary | ICD-10-CM | POA: Diagnosis not present

## 2024-02-28 DIAGNOSIS — R319 Hematuria, unspecified: Secondary | ICD-10-CM | POA: Diagnosis not present

## 2024-02-28 LAB — POCT URINALYSIS DIPSTICK
Bilirubin, UA: NEGATIVE
Glucose, UA: NEGATIVE
Ketones, UA: NEGATIVE
Nitrite, UA: NEGATIVE
Protein, UA: NEGATIVE
Spec Grav, UA: 1.01 (ref 1.010–1.025)
Urobilinogen, UA: 0.2 U/dL
pH, UA: 6 (ref 5.0–8.0)

## 2024-02-28 NOTE — Telephone Encounter (Signed)
 Pt was seen at another [facility] today

## 2024-02-28 NOTE — Telephone Encounter (Signed)
 Chief Complaint: Urinary frequency Symptoms: dysuria Frequency: Yesterday evening Pertinent Negatives: Patient denies fever, hematuria Disposition: [] ED /[] Urgent Care (no appt availability in office) / [x] Appointment(In office/virtual)/ []  [facility]/ [] Home Care/ [] Refused Recommended Disposition /[] [facility]/ []  Follow-up with PCP Additional Notes: Pt reports she noted dysuria, frequency beginning yesterday evening. Denies fever, hematuria, flank pain. OV scheduled. This RN educated pt on home care, new-worsening symptoms, when to call back/seek emergent care. Pt verbalized understanding and agrees to plan.    Copied from CRM [id number]. Topic: Clinical - Red Word Triage >> Feb 28, 2024  1:21 PM [place] M wrote: Red Word that prompted transfer to Nurse Triage: Possible UTI, frequent urination, painful burning sensation Reason for Disposition  Urinating more frequently than usual (i.e., frequency)  Answer Assessment - Initial Assessment Questions 1. SYMPTOM: "What's the main symptom you're concerned about?" (e.g., frequency, incontinence)     frequency 2. ONSET: "When did the frequency start?"     Yesterday evening 3. PAIN: "Is there any pain?" If Yes, ask: "How bad is it?" (Scale: 1-10; mild, moderate, severe)     Mild 4. CAUSE: "What do you think is causing the symptoms?"     Possible UTI 5. OTHER SYMPTOMS: "Do you have any other symptoms?" (e.g., blood in urine, fever, flank pain, pain with urination)     None  Protocols used: Urinary Symptoms-A-AH

## 2024-02-28 NOTE — Progress Notes (Signed)
 Established patient visit   Patient: Jennifer [name]   DOB: July 13, 1956   68 y.o. Female  MRN: [id number] Visit Date: 02/28/2024  Today's healthcare provider: Sherlyn Hay, DO   Chief Complaint  Patient presents with   Urinary Tract Infection    Patient started with urinary frequency and pelvic pressure.  She now reports having pelvic and back pain, dysuria and hematuria.     Subjective    Urinary Tract Infection  Associated symptoms include flank pain and frequency. Pertinent negatives include no chills, nausea or vomiting.  The patient presents with urinary frequency, pelvic pressure, and back pain.  She experiences urinary frequency, pelvic pressure, and back pain, along with dysuria. No fever or chills are present. The back pain is described as sensitivity in the lower right side of her back, particularly when touched. Hematuria is noted. She has not used any over-the-counter medications like phenazopyridine for pain relief, preferring to avoid such treatments without a doctor's recommendation.  She recalls a similar episode in September of the previous year, during which she experienced frequent urination and noticed hematuria twice. At that time, she was prescribed Cephalexin but was advised to hold off on taking it unless the culture showed something. The culture returned negative, and her symptoms resolved without treatment.  She mentions taking pantoprazole, which she thought might be associated with an increased risk of UTIs. She experiences these symptoms approximately once a year, which she finds unusual as she did not have such issues in the past. She brought her remaining Cephalexin from the previous prescription, unsure if she had started taking it. She still has more than enough for a three-day course (total of 9 pills).  Her past medical history includes breast cancer diagnosed five and a half years ago, for which she is taking letrozole. She is postmenopausal and has  been on letrozole to manage hormone levels due to her estrogen receptor-positive status. She does not have ovaries but still has adrenal glands.     Medications: Outpatient Medications Prior to Visit  Medication Sig   ALPRAZolam (XANAX) 0.5 MG tablet TAKE 1/2 TO 1 TABLET BY MOUTH ONCE DAILY AS NEEDED FOR SLEEP/ANXIETY   atenolol (TENORMIN) 25 MG tablet Take 12.5 mg by mouth 3 (three) times daily. For migraine prevention   B Complex Vitamins (B COMPLEX PO) Take by mouth.   baclofen (LIORESAL) 10 MG tablet Take 1 tablet (10 mg total) by mouth 3 (three) times daily.   CALCIUM CARBONATE-VITAMIN D PO Take 1 tablet by mouth daily. 1000 mg   denosumab (PROLIA) 60 MG/ML SOSY injection Inject 60 mg into the skin every 6 (six) months.   Eptinezumab-jjmr (VYEPTI IV) Inject into the vein every 3 (three) months.   famotidine (PEPCID) 40 MG tablet Take 40 mg by mouth 2 (two) times daily.    Galcanezumab-gnlm (EMGALITY) 120 MG/ML SOAJ Inject 120 mg into the skin every 28 (twenty-eight) days.   hydrocortisone 2.5 % cream Apply to affected areas BID PRN as directed.   hydrOXYzine (ATARAX) 25 MG tablet Take 37.5 mg by mouth at bedtime as needed.   ketorolac (TORADOL) 10 MG tablet Take by mouth.   letrozole (FEMARA) 2.5 MG tablet Take 1 tablet (2.5 mg total) by mouth daily.   levocetirizine (XYZAL) 5 MG tablet Take 5 mg by mouth at bedtime.    levothyroxine (SYNTHROID) 75 MCG tablet TAKE 1 TABLET BY MOUTH EVERY DAY BEFORE BREAKFAST   magnesium oxide (MAG-OX) 400 MG tablet  Take by mouth.   Magnesium Oxide 400 (240 Mg) MG TABS Take 400 mg by mouth daily.    metFORMIN (GLUCOPHAGE-XR) 500 MG 24 hr tablet Take 1 tablet (500 mg total) by mouth daily with breakfast.   Multiple Vitamin (MULTIVITAMIN WITH MINERALS) TABS tablet Take 1 tablet by mouth daily.   nabumetone (RELAFEN) 500 MG tablet Take 500 mg by mouth 2 (two) times daily.   naratriptan (AMERGE) 2.5 MG tablet Take 1 tablet (2.5 mg total) by mouth as  needed for migraine. Take one (1) tablet at onset of headache; if returns or does not resolve, may repeat after 4 hours; do not exceed five (5) mg in 24 hours.   neomycin-polymyxin b-dexamethasone (MAXITROL) 3.5-10000-0.1 SUSP 1 drop 3 (three) times daily.   Omega-3 Fatty Acids (FISH OIL) 1000 MG CAPS Take 1,000 mg by mouth daily.    OnabotulinumtoxinA (BOTOX IJ) Inject 1 Dose as directed every 6 (six) weeks.    ondansetron (ZOFRAN ODT) 4 MG disintegrating tablet Take 1 tablet (4 mg total) by mouth every 8 (eight) hours as needed.   pantoprazole (PROTONIX) 20 MG tablet TAKE 1 TABLET BY MOUTH EVERY DAY   PRESCRIPTION MEDICATION Inject into the skin every 6 (six) weeks. Steroid Trigger   Probiotic Product (PROBIOTIC-10 PO) Take 1 capsule by mouth daily.    promethazine (PHENERGAN) 25 MG tablet Take 25 mg by mouth daily as needed.   rizatriptan (MAXALT) 10 MG tablet Take 1 tablet (10 mg total) by mouth once as needed for up to 10 days for migraine. May repeat in 2 hours if needed   triamcinolone cream (KENALOG) 0.1 % Apply to affected areas BID PRN for up to two weeks at a time as directed.   TURMERIC PO Take 1 tablet by mouth daily.   [DISCONTINUED] meloxicam (MOBIC) 15 MG tablet Take 1 tablet (15 mg total) by mouth daily.   Facility-Administered Medications Prior to Visit  Medication Dose Route Frequency Provider   Galcanezumab-gnlm SOAJ 240 mg  240 mg Subcutaneous Once     Review of Systems  Constitutional:  Negative for chills and fever.  Gastrointestinal:  Negative for abdominal pain, nausea and vomiting.  Genitourinary:  Positive for dysuria, flank pain and frequency.       +suprapubic pressure        Objective    BP 120/61 (BP Location: Right Arm, Patient Position: Sitting, Cuff Size: Normal)   Pulse 69   Temp 97.9 F (36.6 C) (Oral)   Ht 4\' 11"  (1.499 m)   Wt 128 lb (58.1 kg)   SpO2 100%   BMI 25.85 kg/m     Physical Exam Vitals reviewed.  Constitutional:       General: She is not in acute distress.    Appearance: She is well-developed.  HENT:     Head: Normocephalic and atraumatic.  Eyes:     General: No scleral icterus.    Conjunctiva/sclera: Conjunctivae normal.  Cardiovascular:     Rate and Rhythm: Normal rate and regular rhythm.     Heart sounds: Normal heart sounds. No murmur heard. Pulmonary:     Effort: Pulmonary effort is normal. No respiratory distress.     Breath sounds: Normal breath sounds. No wheezing or rales.  Abdominal:     General: There is no distension.     Palpations: Abdomen is soft.     Tenderness: There is abdominal tenderness (mild, suprapubic) in the suprapubic area. There is no guarding or rebound.  Skin:  General: Skin is warm and dry.     Capillary Refill: Capillary refill takes less than 2 seconds.     Findings: No rash.  Neurological:     Mental Status: She is alert and oriented to person, place, and time.  Psychiatric:        Behavior: Behavior normal.      Results for orders placed or performed in visit on 02/28/24  POCT urinalysis dipstick  Result Value Ref Range   Color, UA yellow    Clarity, UA cloudy    Glucose, UA Negative Negative   Bilirubin, UA neg    Ketones, UA neg    Spec Grav, UA 1.010 1.010 - 1.025   Blood, UA 3+    pH, UA 6.0 5.0 - 8.0   Protein, UA Negative Negative   Urobilinogen, UA 0.2 0.2 or 1.0 E.U./dL   Nitrite, UA neg    Leukocytes, UA Large (3+) (A) Negative   Appearance     Odor      Assessment & Plan    Urinary tract infection with hematuria, site unspecified -     POCT urinalysis dipstick -     Urine Culture  Urinary Tract Infection (UTI) Recurrent UTI with leukocyturia, no nitrites. Postmenopausal status may contribute to recurrence; patient is likely not a candidate for intravaginal estrogen therapy given her history of estrogen-positive breast cancer; she can check with her oncologist to be sure. Discussed phenazopyridine for symptom relief and preventive  measures with cranberry extract and D-mannose. - Recommend cephalexin 500 mg twice daily for 3 days (she will use prior prescription which is still within date). - Send urine for culture to confirm diagnosis and check for antibiotic resistance. - Consider cranberry extract and D-mannose supplements to reduce recurrence risk. - Advise on adequate fluid intake and timely urination. - Discuss phenazopyridine for pain relief, noting it turns urine orange.  Breast Cancer Estrogen receptor-positive breast cancer, on letrozole for hormone suppression. Avoid hormone therapy due to cancer history. Discussed vaginal estrogen for postmenopausal symptoms, advised against without oncologist's approval. - Continue letrozole as prescribed by oncologist. - Avoid hormone-based treatments, including vaginal estrogen, without oncologist's approval.   Return if symptoms worsen or fail to improve.      I discussed the assessment and treatment plan with the patient  The patient was provided an opportunity to ask questions and all were answered. The patient agreed with the plan and demonstrated an understanding of the instructions.   The patient was advised to call back or seek an in-person evaluation if the symptoms worsen or if the condition fails to improve as anticipated.    Sherlyn Hay, DO  [facility] [phone] (phone) [phone] (fax)  [facility]

## 2024-02-29 ENCOUNTER — Encounter: Payer: Self-pay | Admitting: Sports Medicine

## 2024-02-29 ENCOUNTER — Other Ambulatory Visit (INDEPENDENT_AMBULATORY_CARE_PROVIDER_SITE_OTHER): Payer: Self-pay

## 2024-02-29 ENCOUNTER — Ambulatory Visit: Admitting: Sports Medicine

## 2024-02-29 DIAGNOSIS — M12811 Other specific arthropathies, not elsewhere classified, right shoulder: Secondary | ICD-10-CM

## 2024-02-29 DIAGNOSIS — M25511 Pain in right shoulder: Secondary | ICD-10-CM

## 2024-02-29 DIAGNOSIS — M62830 Muscle spasm of back: Secondary | ICD-10-CM

## 2024-02-29 DIAGNOSIS — M19011 Primary osteoarthritis, right shoulder: Secondary | ICD-10-CM | POA: Diagnosis not present

## 2024-02-29 DIAGNOSIS — G8929 Other chronic pain: Secondary | ICD-10-CM

## 2024-02-29 MED ORDER — LIDOCAINE HCL 1 % IJ SOLN
2.0000 mL | INTRAMUSCULAR | Status: AC | PRN
  Administered 2024-02-29: 2 mL

## 2024-02-29 MED ORDER — BUPIVACAINE HCL 0.25 % IJ SOLN
2.0000 mL | INTRAMUSCULAR | Status: AC | PRN
  Administered 2024-02-29: 2 mL via INTRA_ARTICULAR

## 2024-02-29 MED ORDER — METHYLPREDNISOLONE ACETATE 40 MG/ML IJ SUSP
40.0000 mg | INTRAMUSCULAR | Status: AC | PRN
  Administered 2024-02-29: 40 mg via INTRA_ARTICULAR

## 2024-02-29 NOTE — Progress Notes (Signed)
 Jennifer [name] - 68 y.o. female MRN [id number]  Date of birth: [birth year]  Office Visit Note: Visit Date: 02/29/2024 PCP: Judy Pimple, MD Referred by: Juanda Chance, NP  Subjective: Chief Complaint  Patient presents with   Right Shoulder - Pain   HPI: Jennifer [name] is a pleasant 69 y.o. female who presents today for acute on chronic right shoulder pain.  Previous saw my partner, Ellin Goodie, for evaluation of radiating scapular and trapezius pain.  I did review her CT scan which showed some foraminal narrowing moderate on the right, severe on the left.  She thought this was not as much coming from the neck and more so from the shoulder.  Today, Jennifer [name] states that she has had a long history of shoulder pain.  Back in 2019 she did have an MRI which showed rotator cuff tearing and she did have surgery, questionable distal clavicle excision associated with her rotator cuff repair.  About 2 years ago she did undergo formalized physical therapy and this was very helpful, she is interested in restarting this again.  She has pain with reaching, specifically laterally and overhead.  She does not take much medication, she does have Tylenol on occasion as well as then 10 mg as needed.  She has a long history of migraines and tries to save some of these for her rescue medications.  Pertinent ROS were reviewed with the patient and found to be negative unless otherwise specified above in HPI.   Assessment & Plan: Visit Diagnoses:  1. Chronic right shoulder pain   2. Primary osteoarthritis, right shoulder   3. Rotator cuff arthropathy of right shoulder   4. Spasm of right trapezius muscle    Plan: Impression is chronic right shoulder pain with an exacerbation.  There is moderate arthritic change about the shoulder, but I believe more of her symptoms are emanating from the rotator cuff.  She has provocative pain as well as associated weakness secondary to pain with rotator cuff testing, specifically  her supraspinatus and likely infraspinatus.  Through shared decision making, we did proceed with subacromial joint injection, patient tolerated well.  We will get her started in formalized physical therapy for the rotator cuff and shoulder stabilization.  She also has reciprocal right trapezius hypertonicity and spasming, recommended dry needling and other soft tissue techniques at physical therapy as well.  She may use her baclofen 10 mg as needed for this, also recommended heat, Tylenol as needed.  We will follow-up in about 6 weeks for reevaluation.  Follow-up: Return in about 6 weeks (around 04/11/2024) for R-shoulder .   Meds & Orders: No orders of the defined types were placed in this encounter.   Orders Placed This Encounter  Procedures   XR Shoulder Right   Ambulatory referral to Physical Therapy     Procedures: Large Joint Inj: R subacromial bursa on 02/29/2024 1:58 PM Indications: pain Details: 22 G 1.5 in needle, posterior approach Medications: 2 mL lidocaine 1 %; 2 mL bupivacaine 0.25 %; 40 mg methylPREDNISolone acetate 40 MG/ML Outcome: tolerated well, no immediate complications  Subacromial Joint Injection, Right Shoulder After discussion on risks/benefits/indications, informed verbal consent was obtained. A timeout was then performed. Patient was seated on table in exam room. The patient's shoulder was prepped with betadine and alcohol swabs and utilizing posterior approach a 22G, 1.5" needle was directed anteriorly and laterally into the patient's subacromial space was injected with 2:2:1 mixture of lidocaine:bupivicaine:depomedrol with appreciation of free-flowing of  the injectate into the bursal space. Patient tolerated the procedure well without immediate complications.   Procedure, treatment alternatives, risks and benefits explained, specific risks discussed. Consent was given by the patient. Immediately prior to procedure a time out was called to verify the correct patient,  procedure, equipment, support staff and site/side marked as required. Patient was prepped and draped in the usual sterile fashion.          Clinical History: CT cervical spine without contrast.  COMPARISON: None.  INDICATION: Increasing headaches. Recent posterior head trauma.  TECHNIQUE: Dose lowering technique(s) such as automated exposure control, iterative reconstruction, and mA and/or kV adjustment for patient size was utilized for this examination.  FINDINGS:  CT HEAD:  Normal overall brain volume for age.  No midline shift or hydrocephalus.  No hemorrhages or skull fractures or acute infarctions.  Visualized paranasal sinuses and mastoid air cells clear.  CT cervical spine:  Mild rightward scoliosis mid to lower cervical spine.  No acute fractures or subluxations.  Multilevel spondylosis with moderate right neural foraminal narrowing C3-4. Moderate to severe left neural foraminal narrowing C5-6.  Mild spinal stenosis.  No thickening of prevertebral soft tissues.  Biapical scarring.  IMPRESSION:  No acute intracranial process.  Cervical spondylosis. No acute fracture or subluxation.   Dictated on: 01/25/2023 3:44 PM Signed by: Theophilus Kinds, M.D.  01/25/2023 3:54 PM Exam End: 01/25/23 14:56 Last Resulted: 01/25/23 15:54 Received From: [facility]  She reports that she has never smoked. She has never been exposed to tobacco smoke. She has never used smokeless tobacco.  Recent Labs    09/17/23 0943  HGBA1C 5.7    Objective:    Physical Exam  Gen: Well-appearing, in no acute distress; non-toxic CV: Well-perfused. Warm.  Resp: Breathing unlabored on room air; no wheezing. Psych: Fluid speech in conversation; appropriate affect; normal thought process  Ortho Exam - Right shoulder/scapula/trapezius: There is full active and passive range of motion in all directions but pain with flexion and pain and associated weakness with abduction.  Positive drop  arm test, positive empty can, positive pain and associated weakness with resisted abduction, lesser extent external rotation.  There are some mild TTP palpating over the [facility] joint itself, negative scarf's and crossarm test.  Imaging: XR Shoulder Right Result Date: 02/29/2024 3 views of the right shoulder including AP, scapular Y and axial view were ordered and reviewed by myself today.  X-rays demonstrate mild to moderate glenohumeral joint arthritic change as well as moderate AC joint arthritis.  There is spurring off the distal clavicle and acromion, questionable from prior distal clavicle excision versus arthritic spurring. No acute fracture. Downsloping acromion noted.   *I reviewed R-shoulder x-ray from 07/27/22 and MRI R-shoulder from 07/06/2008 today during office visit Narrative & Impression  CLINICAL DATA:  Right shoulder pain.   EXAM: RIGHT SHOULDER - 2+ VIEW   COMPARISON:  None Available.   FINDINGS: There is no evidence of fracture or dislocation. Moderate degenerative changes seen involving the right acromioclavicular and glenohumeral joints. Soft tissues are unremarkable.   IMPRESSION: Moderate degenerative joint disease of the right acromioclavicular and glenohumeral joints. No acute abnormality seen.     Electronically Signed   By: Lupita Raider M.D.   On: 07/27/2022 12:39   Narrative & Impression  * PRIOR REPORT IMPORTED FROM AN EXTERNAL SYSTEM    PRIOR REPORT IMPORTED FROM THE [employer] WORKFLOW SYSTEM    REASON FOR EXAM:    Rt Shoulder Pain  COMMENTS:  PROCEDURE:     MR  - MR SHOULDER RT  WO CONTRAST  - Jul 06 2008  8:42AM    RESULT:     Multiplanar/multisequence imaging of the RIGHT shoulder was  obtained without the administration of gadolinium.    The biceps tendon is appreciated along the bicipital groove.  Limited  evaluation of the cartilaginous glenoid labrum is unremarkable.   Evaluation  of the osseous structures demonstrates mild osteophytosis  along the  humeral  head. There is hypertrophic spurring involving the acromioclavicular  joint.  Increased T2 signal projects along the subacromial bursal region and  subdeltoid bursal region. Evaluation of the supraspinatus tendon  demonstrates thinning along the midportion of the tendon just distal to  the  musculotendinous junction with areas of intermediate T2 signal within the  distal portion of the tendon along the humeral head insertion. There is  increased T2 signal within the substance of the infraspinatus tendon which  appears to traverse the substance of the tendon. There is no evidence of  muscle or tendon retraction. The subscapularis and teres minor tendons and  musculature appears intact.  No drainable loculated fluid collections are  appreciated.    IMPRESSION:    1.     Findings which appear to reflect the sequela of partial tearing of  the supraspinatus tendon without muscle or tendon retraction.  This may  represent a high-grade partial thickness tear or possibly even a full  thickness tear without retraction.  2.     Full thickness tear versus high grade partial thickness tearing of  the infraspinatus tendon.  3.     Impingement anatomy involving the acromioclavicular joint.  4.     Degenerative changes involving the humeral head.     Past Medical/Family/Surgical/Social History: Medications & Allergies reviewed per EMR, new medications updated. Patient Active Problem List   Diagnosis Date Noted   Cerumen impaction 11/10/2023   Current use of proton pump inhibitor 09/12/2023   Chronic migraine without aura, with intractable migraine, so stated, with status migrainosus 07/29/2023   Migraine without aura and without status migrainosus, not intractable 07/29/2023   Thoracic back pain 01/21/2023   Pain in right shoulder 08/12/2022   Raynaud's disease 03/17/2022   Iron deficiency anemia 09/08/2020   Common bile duct dilatation 09/06/2020   History of breast  cancer 03/01/2020   Genetic testing 11/14/2019   Family history of prostate cancer    Family history of breast cancer    Malignant neoplasm of upper-inner quadrant of right breast in female, estrogen receptor positive (HCC) 09/20/2018   B12 deficiency 04/13/2018   Joint pain 04/11/2018   Colon cancer screening 09/16/2017   Prediabetes 07/28/2016   Allergic rhinitis 11/13/2014   Hyperlipidemia, mild 09/26/2014   Routine general medical examination at a health care facility 10/28/2012   GERD 09/16/2010   Hypothyroidism 05/07/2008   Osteoporosis 05/07/2008   Migraine with aura 11/11/2007   Past Medical History:  Diagnosis Date   Allergic rhinitis    Allergy    seasonal   Anemia    Anxiety    Arthritis    hands, knees   Asthma    "as a child"   Blood transfusion without reported diagnosis    "as a child"   Breast cancer (HCC) 2019   Right Breast Cancer   Cancer (HCC) 09/2018   Breast CA new DX, right breast   Cervical dysplasia    Common bile duct dilation    Diabetes mellitus without  complication (HCC)    Endometriosis    Esophageal reflux    Family history of adverse reaction to anesthesia    younger sister anxiety for a dew days after   Family history of breast cancer    Family history of breast cancer    Family history of prostate cancer    Hepatic cyst 08/2020   multiple   Hepatic hemangioma 08/2020   intrahepatic hemangioma   Hiatal hernia    HSV-1 (herpes simplex virus 1) infection    Hyperlipidemia    Hypertension    pt.denies 10/19/22   Hypothyroid    Migraine with aura, without mention of intractable migraine without mention of status migrainosus    Osteopenia 08/2019   T score -2.2 FRAX 11% / 1.7%   Osteoporosis 05/07/2008   Qualifier: Diagnosis of  By: Milinda Antis MD, Colon Flattery    Personal history of radiation therapy 2019   Right Breast Cancer   Pre-diabetes    Raynaud's disease    Family History  Problem Relation Age of Onset   Breast cancer  Mother 25   Diabetes Mother    Irritable bowel syndrome Mother    Cancer Mother    Hearing loss Mother    Hyperlipidemia Mother    Miscarriages / Stillbirths Mother    Hypertension Father    Heart disease Father    Hypertension Sister    Anuerysm Sister        head   Depression Sister    Irritable bowel syndrome Sister    Hypertension Sister    Anxiety disorder Sister    Other Sister        prediabetes   Celiac disease Brother    Diabetes Brother    Osteoporosis Maternal Grandmother    Diabetes Maternal Grandmother    Arthritis Maternal Grandmother    COPD Paternal Grandmother    Heart disease Paternal Grandfather    Breast cancer Cousin 64       pat first cousin   Breast cancer Cousin        mat second cousin with breast cancer in her 30s   Thyroid cancer Cousin 59       pat first cousin   Prostate cancer Other 50   Colon cancer Neg Hx    Stomach cancer Neg Hx    Esophageal cancer Neg Hx    Pancreatic cancer Neg Hx    Rectal cancer Neg Hx    Colon polyps Neg Hx    Crohn's disease Neg Hx    Ulcerative colitis Neg Hx    Past Surgical History:  Procedure Laterality Date   ABDOMINAL HYSTERECTOMY     BIOPSY  09/12/2020   Procedure: BIOPSY;  Surgeon: Rachael Fee, MD;  Location: [facility];  Service: Endoscopy;;   BREAST BIOPSY Left 11/27/2022   MM LT BREAST BX W LOC DEV 1ST LESION IMAGE BX SPEC STEREO GUIDE 11/27/2022 [facility]   BREAST EXCISIONAL BIOPSY Bilateral over 10 years ago   benign x 2    BREAST LUMPECTOMY Right 10/10/2018   BREAST LUMPECTOMY WITH RADIOACTIVE SEED AND SENTINEL LYMPH NODE BIOPSY Right 10/10/2018   Procedure: RIGHT BREAST LUMPECTOMY WITH RADIOACTIVE SEED AND RIGHT SENTINEL LYMPH NODE BIOPSY;  Surgeon: Griselda Miner, MD;  Location: [facility];  Service: General;  Laterality: Right;   BREAST SURGERY     Benign breast lump excised   CERVICAL CONE BIOPSY  1985   severe dysplasia   COLONOSCOPY  2005 and  dec 2019    normal   COLPOSCOPY     endoscopy  10/2018   ESOPHAGOGASTRODUODENOSCOPY (EGD) WITH PROPOFOL N/A 09/12/2020   Procedure: ESOPHAGOGASTRODUODENOSCOPY (EGD) WITH PROPOFOL;  Surgeon: Rachael Fee, MD;  Location: [facility];  Service: Endoscopy;  Laterality: N/A;   EUS N/A 09/12/2020   Procedure: UPPER ENDOSCOPIC ULTRASOUND (EUS) RADIAL;  Surgeon: Rachael Fee, MD;  Location: [facility];  Service: Endoscopy;  Laterality: N/A;   LAPAROSCOPIC ASSISTED VAGINAL HYSTERECTOMY     LAPAROSCOPIC BILATERAL SALPINGO OOPHERECTOMY Bilateral 10/27/2019   Procedure: LAPAROSCOPIC BILATERAL SALPINGO OOPHORECTOMY WITH PERITONEAL WASHINGS;  Surgeon: Genia Del, MD;  Location: [facility];  Service: Gynecology;  Laterality: Bilateral;  request 1:00pm on Friday, Dec. 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

## 2024-02-29 NOTE — Progress Notes (Signed)
 Patient says that she has had right shoulder pain in the past that got much better with physical therapy. Her pain flared back up about 3 weeks ago. She is able to raise her arm more today than she was 2 weeks ago when she was here for an appointment. She is interested in starting physical therapy again.

## 2024-03-01 LAB — SPECIMEN STATUS REPORT

## 2024-03-01 LAB — URINE CULTURE

## 2024-03-03 ENCOUNTER — Ambulatory Visit: Admitting: Podiatry

## 2024-03-03 DIAGNOSIS — L600 Ingrowing nail: Secondary | ICD-10-CM | POA: Diagnosis not present

## 2024-03-03 NOTE — Progress Notes (Signed)
 Subjective: Jennifer [name] is a 68 y.o.  female returns to office today for follow up evaluation after having right Hallux Medial border nail avulsion performed. Patient has been soaking using epsom salt and applying topical antibiotic covered with bandaid daily. Patient denies fevers, chills, nausea, vomiting. Denies any calf pain, chest pain, SOB.   Objective:  Vitals: Reviewed  General: Well developed, nourished, in no acute distress, alert and oriented x3   Dermatology: Skin is warm, dry and supple bilateral. Medial hallux nail border appears to be clean, dry, with mild granular tissue and surrounding scab. There is no surrounding erythema, edema, drainage/purulence. The remaining nails appear unremarkable at this time. There are no other lesions or other signs of infection present.  Neurovascular status: Intact. No lower extremity swelling; No pain with calf compression bilateral.  Musculoskeletal: Decreased tenderness to palpation of the Medial hallux nail fold(s). Muscular strength within normal limits bilateral.   Assesement and Plan: S/p partial nail avulsion, doing well.   -disContinue soaking in epsom salts twice a day followed by antibiotic ointment and a band-aid as needed. Can leave uncovered at night. .  -If the area does not heal properply, call the office for follow-up appointment, or sooner if any problems arise.  -Monitor for any signs/symptoms of infection. Call the office immediately if any occur or go directly to the emergency room. Call with any questions/concerns.  Raydon Chappuis, DPM

## 2024-03-09 ENCOUNTER — Encounter: Payer: Self-pay | Admitting: Family Medicine

## 2024-03-14 ENCOUNTER — Encounter: Payer: Self-pay | Admitting: Hematology and Oncology

## 2024-03-15 ENCOUNTER — Telehealth: Payer: Self-pay | Admitting: Neurology

## 2024-03-15 ENCOUNTER — Other Ambulatory Visit: Payer: Self-pay | Admitting: Neurology

## 2024-03-15 ENCOUNTER — Telehealth: Payer: Medicare HMO | Admitting: Neurology

## 2024-03-15 DIAGNOSIS — G43711 Chronic migraine without aura, intractable, with status migrainosus: Secondary | ICD-10-CM | POA: Diagnosis not present

## 2024-03-15 MED ORDER — AJOVY 225 MG/1.5ML ~~LOC~~ SOAJ: 225.0000 mg | SUBCUTANEOUS | 11 refills | Status: DC

## 2024-03-15 MED ORDER — ZAVZPRET 10 MG/ACT NA SOLN: 10.0000 mg | Freq: Every day | NASAL | 1 refills | Status: DC | PRN

## 2024-03-15 MED ORDER — AJOVY 225 MG/1.5ML ~~LOC~~ SOAJ: SUBCUTANEOUS | 1 refills | Status: DC

## 2024-03-15 NOTE — Telephone Encounter (Signed)
 Please leave 2 boxes of Ajovy  at the front desk and 2 boxes of zavzpret  spray to try please

## 2024-03-15 NOTE — Telephone Encounter (Signed)
 Samples are in closet refrigerator and closet for her ready to pick up.

## 2024-03-15 NOTE — Addendum Note (Signed)
 Addended by: Viktoria Gray on: 03/15/2024 04:55 PM   Modules accepted: Orders

## 2024-03-15 NOTE — Telephone Encounter (Signed)
 In MED refrigerator:  2 samples of AJOVY  NDC [id number], LOT [id number]  exp 11/27.  Along with 2 samples of ZAVZPRET  in paper bag in MED closet. NDC 40102725-36, LOT [id number]  exp 11/2024.

## 2024-03-15 NOTE — Progress Notes (Addendum)
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 daily.      Fremanezumab -vfrm (AJOVY ) 225 MG/1.5ML SOAJ Inject 225mg  into skin every 30 days.  2 samples of AJOVY  NDC 4824079788, LOT [id number]  exp 11/27. 1.5 mL 1   hydrocortisone  2.5 % cream Apply to affected areas BID PRN as directed. 30 g 3   hydrOXYzine  (ATARAX ) 25 MG tablet Take 37.5 mg by mouth at bedtime as needed.     ketorolac  (TORADOL ) 10 MG tablet Take by mouth.     letrozole  (FEMARA ) 2.5 MG tablet Take 1 tablet (2.5 mg total) by mouth daily. 90 tablet 3   levocetirizine (XYZAL) 5 MG tablet Take 5 mg by mouth at bedtime.   3   levothyroxine  (SYNTHROID ) 75 MCG tablet TAKE 1 TABLET BY MOUTH EVERY DAY BEFORE BREAKFAST 90 tablet 2   magnesium  oxide (MAG-OX) 400 MG tablet Take by mouth.     Magnesium  Oxide 400 (240 Mg) MG TABS Take 400 mg by mouth daily.      metFORMIN  (GLUCOPHAGE -XR) 500 MG 24 hr tablet Take 1 tablet (500 mg total) by mouth daily with breakfast. 90 tablet 3   Multiple Vitamin (MULTIVITAMIN WITH MINERALS) TABS tablet Take 1 tablet  by mouth daily.     nabumetone  (RELAFEN ) 500 MG tablet Take 500 mg by mouth 2 (two) times daily.     naratriptan  (AMERGE) 2.5 MG tablet Take 1 tablet (2.5 mg total) by mouth as needed for migraine. Take one (1) tablet at onset of headache; if returns or does not resolve, may repeat after 4 hours; do not exceed five (5) mg in 24 hours. 10 tablet 0   neomycin-polymyxin b-dexamethasone  (MAXITROL) 3.5-10000-0.1 SUSP 1 drop 3 (three) times daily.     Omega-3 Fatty Acids (FISH OIL) 1000 MG CAPS Take 1,000 mg by mouth daily.      OnabotulinumtoxinA  (BOTOX  IJ) Inject 1 Dose as directed every 6 (six) weeks.      ondansetron  (ZOFRAN  ODT) 4 MG disintegrating tablet Take 1 tablet (4 mg total) by mouth every 8 (eight) hours as needed. 20 tablet 0   pantoprazole  (PROTONIX ) 20 MG tablet TAKE 1 TABLET BY MOUTH EVERY DAY 90 tablet 1   PRESCRIPTION MEDICATION Inject into the skin every 6 (six) weeks. Steroid Trigger     Probiotic Product (PROBIOTIC-10 PO) Take 1 capsule by mouth daily.      promethazine  (PHENERGAN ) 25 MG tablet Take 25 mg by mouth daily as needed.     rizatriptan  (MAXALT ) 10 MG tablet Take 1 tablet (10 mg total) by mouth once as needed for up to 10 days for migraine. May repeat in  2 hours if needed 10 tablet 0   triamcinolone  cream (KENALOG ) 0.1 % Apply to affected areas BID PRN for up to two weeks at a time as directed. 30 g 1   TURMERIC PO Take 1 tablet by mouth daily.     Zavegepant HCl (ZAVZPRET ) 10 MG/ACT SOLN Place 10 mg into the nose daily as needed (onset migraine). NDC 36460864-97, LOT [id number]  exp 11/2024 1 each 1   No current facility-administered medications for this visit.    Allergies as of 03/15/2024 - Review Complete 03/03/2024  Allergen Reaction Noted   Ciprofloxacin Other (See Comments) 09/21/2018   Codeine phosphate Other (See Comments) 02/07/2007   Decongestant [pseudoephedrine] Other (See Comments) 09/21/2018   Diclofenac  12/23/2022   Diphenhydramine hcl (sleep)   04/19/2022   Flonase  [fluticasone  propionate] Other (See Comments) 11/13/2014   Fluticasone   04/19/2022   Hydrocodone  Other (See Comments) 11/27/2008   Macrobid  [nitrofurantoin ]  01/22/2021   Nasal spray  04/19/2022   Nasonex  [mometasone ]  10/29/2021   Reglan [metoclopramide] Other (See Comments) 09/23/2018   Topamax [topiramate] Other (See Comments) 09/21/2018   Benadryl [diphenhydramine hcl] Palpitations 10/18/2017   Sulfa antibiotics Anxiety 09/05/2014    Vitals: There were no vitals taken for this visit. Last Weight:  Wt Readings from Last 1 Encounters:  03/16/24 128 lb (58.1 kg)   Last Height:   Ht Readings from Last 1 Encounters:  03/16/24 5' (1.524 m)      Physical exam: Exam: Gen: NAD, conversant      CV: No palpitations or chest pain or SOB. VS: Breathing at a normal rate. Weight appears within normal limits. Not febrile. Eyes: Conjunctivae clear without exudates or hemorrhage  Neuro: Detailed Neurologic Exam  Speech:    Speech is normal; fluent and spontaneous with normal comprehension.  Cognition:    The patient is oriented to person, place, and time;     recent and remote memory intact;     language fluent;     normal attention, concentration, fund of knowledge Cranial Nerves:    The pupils are equal, round, and reactive to light. Visual fields are full Extraocular movements are intact.  The face is symmetric with normal sensation. The palate elevates in the midline. Hearing intact. Voice is normal. Shoulder shrug is normal. The tongue has normal motion without fasciculations.   Coordination: normal  Gait:    No abnormalities noted or reported  Motor Observation:   no involuntary movements noted. Tone:    Appears normal  Posture:    Posture is normal. normal erect    Strength:    Strength is anti-gravity and symmetric in the upper and lower limbs.      Sensation: intact to LT, no reports of numbness or tingling or paresthesias         Assessment/Plan:  Patient with chronic migraines for years, Emgality  not beneficial anymore, Vyepti  not helpful, has been to the ED 7 times this year and 4 last month for migraines/headaches, Emgality  not working as well anymore having >8 migraine days a month and 15 headache days a month again for > 3 months.   - Stop Vyepti (too expensive) Try Ajovy   - As acute tried multiple triptans last naratriptan , not as effective as she would like, can try Zavzpret  as is fast acting and patient has acute onset migraines necessitating dast action, the [employer] Society recommends and oral and non oral fast acting acute medications. Will try Zavzpret  nasal (imitrex had side effects cannot use injections  or nasal), can consider other triptans(frovatriptan) or Reyviw or ubrelvy or zavzpret  or nurtec(was not approved)  - From a thorough review of records, medications tried greater than 3 months that can be used in headache or migraine management include: Tylenol , ibuprofen , Xanax , aspirin , atenolol , baclofen , Fioricet , Decadron , gabapentin , hydrocodone , hydroxyzine , ketorolac  injections and tablets, Claritin, mag oxide p.o., mag sulfate IV, methylprednisolone , Botox  since 2019, Zofran , prednisone  tablets on multiple occasions, Compazine  injections, promethazine  injections and oral, sumatriptan, rizatriptan , tramadol , amitriptyline, nortriptyline, topiramate, zonisamide, Robaxin, Aimovig contraindicated due to constipation, cephaly, Emgality , Ajovy , Aimovig contraindicated due to constipation but may need to try this as we are running out of options, vyepti , naratriptan , zavzpret    No orders of the defined types were placed in this encounter.  Meds ordered this encounter  Medications   Fremanezumab -vfrm (AJOVY ) 225 MG/1.5ML SOAJ    Sig: Inject 225 mg into the skin every 30 (thirty) days.    Dispense:  1.5 mL    Refill:  11    Cc: Tower, Jennifer [name] LABOR, MD,  Tower, Jennifer [name] LABOR, MD  Jennifer Epp,  MD  [facility] [address] [place], [place] [address]  Phone [phone] Fax [phone]  I spent over 40 minutes of face-to-face and non-face-to-face time with patient on the  1. Chronic migraine without aura, with intractable migraine, so stated, with status migrainosus     diagnosis.  This included previsit chart review, lab review, study review, order entry, electronic health record documentation, patient education on the different diagnostic and therapeutic options, counseling and coordination of care, risks and benefits of management, compliance, or risk factor reduction

## 2024-03-16 ENCOUNTER — Encounter: Payer: Self-pay | Admitting: Neurology

## 2024-03-16 ENCOUNTER — Ambulatory Visit: Payer: HMO | Admitting: Neurology

## 2024-03-16 ENCOUNTER — Telehealth: Payer: Self-pay | Admitting: Neurology

## 2024-03-16 VITALS — BP 110/70 | Ht 60.0 in | Wt 128.0 lb

## 2024-03-16 DIAGNOSIS — G43711 Chronic migraine without aura, intractable, with status migrainosus: Secondary | ICD-10-CM | POA: Diagnosis not present

## 2024-03-16 MED ORDER — ONABOTULINUMTOXINA 200 UNITS IJ SOLR
155.0000 [IU] | Freq: Once | INTRAMUSCULAR | Status: AC
  Administered 2024-03-16: 155 [IU] via INTRAMUSCULAR

## 2024-03-16 NOTE — Telephone Encounter (Signed)
 Please call and schedule video follow up with dr Tresia Fruit 4 months please

## 2024-03-16 NOTE — Progress Notes (Signed)
   BOTOX  PROCEDURE NOTE FOR MIGRAINE HEADACHE  HISTORY: PALAK TERCERO is here for Botox  for chronic migraine headache. Last was 12/21/23 with me. She just saw Dr. Tresia Fruit yesterday for VV. Gave her samples of Ajovy  and Zavzpret . She has had 8 migraines this month so far. Amerge is helping she thinks to keep her out of the ED. No longer daily headache since Botox . She is pausing Vyepti  due to high cost of infusion.   Description of procedure:  The patient was placed in a sitting position. The standard protocol was used for Botox  as follows, with 5 units of Botox  injected at each site:  -Procerus muscle, midline injection  -Corrugator muscle, bilateral injection  -Frontalis muscle, bilateral injection, with 2 sites each side, medial injection was performed in the upper one third of the frontalis muscle, in the region vertical from the medial inferior edge of the superior orbital rim. The lateral injection was again in the upper one third of the forehead vertically above the lateral limbus of the cornea, 1.5 cm lateral to the medial injection site.  -Temporalis muscle injection, 4 sites, bilaterally. The first injection was 3 cm above the tragus of the ear, second injection site was 1.5 cm to 3 cm up from the first injection site in line with the tragus of the ear. The third injection site was 1.5-3 cm forward between the first 2 injection sites. The fourth injection site was 1.5 cm posterior to the second injection site.  -Occipitalis muscle injection, 3 sites, bilaterally. The first injection was done one half way between the occipital protuberance and the tip of the mastoid process behind the ear. The second injection site was done lateral and superior to the first, 1 fingerbreadth from the first injection. The third injection site was 1 fingerbreadth superiorly and medially from the first injection site.  -Cervical paraspinal muscle injection, 2 sites, bilateral, the first injection site was 1 cm  from the midline of the cervical spine, 3 cm inferior to the lower border of the occipital protuberance. The second injection site was 1.5 cm superiorly and laterally to the first injection site.  -Trapezius muscle injection was performed at 3 sites, bilaterally. The first injection site was in the upper trapezius muscle halfway between the inflection point of the neck, and the acromion. The second injection site was one half way between the acromion and the first injection site. The third injection was done between the first injection site and the inflection point of the neck.  A 200 unit bottle of Botox  was used, 155 units were injected, the rest of the Botox  was wasted. The patient tolerated the procedure well, there were no complications of the above procedure.  Botox  NDC 4098-1191-47 Lot number [id number] Expiration date 02/2026 BB

## 2024-03-16 NOTE — Progress Notes (Signed)
 Botox - 200 units x 1 vial Lot: [id number] Expiration: 02/2026 NDC: 1610-9604-54  Bacteriostatic 0.9% Sodium Chloride - 4 mL  Lot: [id number] Expiration: 09/23/2024 NDC: 1478-2956-21  Dx: H08.657 B/B Witnessed by Santana Cue, CMA

## 2024-03-16 NOTE — Therapy (Signed)
 OUTPATIENT PHYSICAL THERAPY EVALUATION   Patient Name: Jennifer [name] MRN: [id number] DOB:04-25-1956, 68 y.o., female Today's Date: 03/20/2024  END OF SESSION:  PT End of Session - 03/20/24 1518     Visit Number 1    Number of Visits 20    Date for PT Re-Evaluation 05/29/24    Authorization Type [employer]    Progress Note Due on Visit 10    PT Start Time 1520    PT Stop Time 1554    PT Time Calculation (min) 34 min    Activity Tolerance Patient tolerated treatment well    Behavior During Therapy WFL for tasks assessed/performed             Past Medical History:  Diagnosis Date   Allergic rhinitis    Allergy    seasonal   Anemia    Anxiety    Arthritis    hands, knees   Asthma    "as a child"   Blood transfusion without reported diagnosis    "as a child"   Breast cancer (HCC) 2019   Right Breast Cancer   Cancer (HCC) 09/2018   Breast CA new DX, right breast   Cervical dysplasia    Common bile duct dilation    Diabetes mellitus without complication (HCC)    Endometriosis    Esophageal reflux    Family history of adverse reaction to anesthesia    younger sister anxiety for a dew days after   Family history of breast cancer    Family history of breast cancer    Family history of prostate cancer    Hepatic cyst 08/2020   multiple   Hepatic hemangioma 08/2020   intrahepatic hemangioma   Hiatal hernia    HSV-1 (herpes simplex virus 1) infection    Hyperlipidemia    Hypertension    pt.denies 10/19/22   Hypothyroid    Migraine with aura, without mention of intractable migraine without mention of status migrainosus    Osteopenia 08/2019   T score -2.2 FRAX 11% / 1.7%   Osteoporosis 05/07/2008   Qualifier: Diagnosis of  By: Malissa Se MD, Dannette Dutch    Personal history of radiation therapy 2019   Right Breast Cancer   Pre-diabetes    Raynaud's disease    Past Surgical History:  Procedure Laterality Date   ABDOMINAL HYSTERECTOMY     BIOPSY   09/12/2020   Procedure: BIOPSY;  Surgeon: Janel Medford, MD;  Location: [facility];  Service: Endoscopy;;   BREAST BIOPSY Left 11/27/2022   MM LT BREAST BX W LOC DEV 1ST LESION IMAGE BX SPEC STEREO GUIDE 11/27/2022 [facility]   BREAST EXCISIONAL BIOPSY Bilateral over 10 years ago   benign x 2    BREAST LUMPECTOMY Right 10/10/2018   BREAST LUMPECTOMY WITH RADIOACTIVE SEED AND SENTINEL LYMPH NODE BIOPSY Right 10/10/2018   Procedure: RIGHT BREAST LUMPECTOMY WITH RADIOACTIVE SEED AND RIGHT SENTINEL LYMPH NODE BIOPSY;  Surgeon: Lillette Reid III, MD;  Location: [facility];  Service: General;  Laterality: Right;   BREAST SURGERY     Benign breast lump excised   CERVICAL CONE BIOPSY  1985   severe dysplasia   COLONOSCOPY  2005 and dec 2019   normal   COLPOSCOPY     endoscopy  10/2018   ESOPHAGOGASTRODUODENOSCOPY (EGD) WITH PROPOFOL  N/A 09/12/2020   Procedure: ESOPHAGOGASTRODUODENOSCOPY (EGD) WITH PROPOFOL ;  Surgeon: Janel Medford, MD;  Location: [facility];  Service: Endoscopy;  Laterality: N/A;  EUS N/A 09/12/2020   Procedure: UPPER ENDOSCOPIC ULTRASOUND (EUS) RADIAL;  Surgeon: Janel Medford, MD;  Location: [facility];  Service: Endoscopy;  Laterality: N/A;   LAPAROSCOPIC ASSISTED VAGINAL HYSTERECTOMY     LAPAROSCOPIC BILATERAL SALPINGO OOPHERECTOMY Bilateral 10/27/2019   Procedure: LAPAROSCOPIC BILATERAL SALPINGO OOPHORECTOMY WITH PERITONEAL WASHINGS;  Surgeon: Lavoie, Marie-Lyne, MD;  Location: [facility] ;  Service: Gynecology;  Laterality: Bilateral;  request 1:00pm on Friday, Dec. 4th in [place] time held requests one hour OR time   moles removed from upper back, face lft, 1 between breasts, upper leg left inside  10/18/2019   wearing small round bandaids on for 2 weeks   ROTATOR CUFF REPAIR Bilateral 08/2008   UPPER GASTROINTESTINAL ENDOSCOPY     Patient Active Problem List   Diagnosis Date Noted   Cerumen impaction  11/10/2023   Current use of proton pump inhibitor 09/12/2023   Chronic migraine without aura, with intractable migraine, so stated, with status migrainosus 07/29/2023   Migraine without aura and without status migrainosus, not intractable 07/29/2023   Thoracic back pain 01/21/2023   Pain in right shoulder 08/12/2022   Raynaud's disease 03/17/2022   Iron  deficiency anemia 09/08/2020   Common bile duct dilatation 09/06/2020   History of breast cancer 03/01/2020   Genetic testing 11/14/2019   Family history of prostate cancer    Family history of breast cancer    Malignant neoplasm of upper-inner quadrant of right breast in female, estrogen receptor positive (HCC) 09/20/2018   B12 deficiency 04/13/2018   Joint pain 04/11/2018   Colon cancer screening 09/16/2017   Prediabetes 07/28/2016   Allergic rhinitis 11/13/2014   Hyperlipidemia, mild 09/26/2014   Routine general medical examination at a health care facility 10/28/2012   GERD 09/16/2010   Hypothyroidism 05/07/2008   Osteoporosis 05/07/2008   Migraine with aura 11/11/2007    PCP: Clemens Curt MD  REFERRING PROVIDER: Shauna Del, DO  REFERRING DIAG: [id number] (ICD-10-CM) - Chronic right shoulder pain M12.811 (ICD-10-CM) - Rotator cuff arthropathy of right shoulder M62.830 (ICD-10-CM) - Spasm of right trapezius muscle  THERAPY DIAG:  Cervicalgia  Pain in thoracic spine  Chronic right shoulder pain  Abnormal posture  Muscle weakness (generalized)  Rationale for Evaluation and Treatment: Rehabilitation  ONSET DATE: Acute on Chronic, Feb 2025 worsening.   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  Pt indicated having complaints of Rt shoulder/neck with pain that seemed to worsen.  Pt indicated initially limited in Rt arm movement due to pain symptoms.  Pt indicated doing the exercises and it seemed to help.  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   Pt indicated injection helped.  Pt indicated having some trouble sleeping on Rt side.    Chronicity of symptoms, previously treated in clinic with dry needling included in treatment options.   PERTINENT HISTORY:  Arthritis, history of Rt breast cancer, DM, osteopenia, raynauds disease, history of ED visits related to migraines  PAIN:  NPRS scale: pain at worst prior to shot 10/10, current 1-/10 Pain location: Rt neck, Rt shoulder Pain description: constant, sharp, tightness at times.  Aggravating factors: Rt arm movement, lifting, head movements at times.  Relieving factors: injection  PRECAUTIONS: None  RED FLAGS: None  WEIGHT BEARING RESTRICTIONS: No  FALLS:  Has patient fallen in last 6 months? 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    OBJECTIVE:   PATIENT SURVEYS: Patient-Specific Activity Scoring Scheme  "0" represents "unable to perform." "10" represents "able to perform at prior level. 0 1 2 3 4 5 6 7 8 9  10 (Date and Score)   Activity Eval  03/20/2024    1. Reaching overhead 7     2. Lifting household items  6    3. Sleeping 5   4.    5.    Score 6 avg    Total score = sum of the activity scores/number of activities Minimum detectable change (90%CI) for average score = 2 points Minimum detectable change (90%CI) for single activity score = 3 points   COGNITION: 03/20/2024 Overall cognitive status: Within  functional limits for tasks assessed  SENSATION: 03/20/2024 [facility]  POSTURE:  03/20/2024 rounded shoulders, forward head, and increased thoracic kyphosis  PALPATION: 03/20/2024 Trigger points, palpation tenderness with concordant symptoms Rt upper trap, Rt levator scap   CERVICAL ROM:   ROM AROM (deg) Eval 03/20/2024  Flexion 57  Extension 50 c Rt neck pulling   Right lateral flexion   Left lateral flexion   Right rotation 72 c Rt neck pain  Left rotation 68   (Blank rows = not tested)  UPPER EXTREMITY ROM:   ROM Right Eval 03/20/2024 Left Eval 03/20/2024  Shoulder flexion [facility]  Shoulder extension    Shoulder abduction    Shoulder adduction    Shoulder extension    Shoulder internal rotation WFL   Shoulder external rotation WFL   Elbow flexion    Elbow extension    Wrist flexion    Wrist extension    Wrist ulnar deviation    Wrist radial deviation    Wrist pronation    Wrist supination     (Blank rows = not tested)  UPPER EXTREMITY MMT:  MMT Right Eval 03/20/2024 Left Eval 03/20/2024  Shoulder flexion 4+/5 5/5  Shoulder extension    Shoulder abduction 3+/5 c pain 4/5  Shoulder adduction    Shoulder extension    Shoulder internal rotation 5/5 5/5  Shoulder external rotation 4/5 5/5  Middle trapezius  Lower trapezius    Elbow flexion 5/5 5/5  Elbow extension 5/5 5/5  Wrist flexion    Wrist extension    Wrist ulnar deviation    Wrist radial deviation    Wrist pronation    Wrist supination    Grip strength     (Blank rows = not tested)  SPECIAL TESTS:  03/20/2024 No cervical testing indicated. (-) drop arm on Rt, (+) painful arc in lowering on Rt shoulder elevation  FUNCTIONAL TESTS:  03/20/2024 No specific testing today                                                                                                                                                                                  TODAY'S TREATMENT:                                                                                                        DATE: 03/20/2024 Therex:    HEP instruction/performance c cues for techniques, handout provided.  Verbal review of each for comprehension and symptom assessment.  See below for exercise list  Neuro Re-ed (to improve postural recruitment/activation/awareness) Tband rows green band with scapular retraction 2 x 15 Green band GH ext 2x 15 Standing Isometric muscle activation for ER, abduction with Rt arm at side 5 sec hold x 5 each with cues for home.   Manual Lt sidelying Rt infraspinatus, Rt upper trap, Rt levator percussive device for STM, pain symptom.     PATIENT EDUCATION:  03/20/2024 Education details: HEP, POC DN (previously provided Person educated: Patient Education method: Explanation, Demonstration, Verbal cues, and Handouts Education comprehension: verbalized understanding, returned demonstration, and verbal cues required  HOME EXERCISE PROGRAM: Access Code: [email or web address] URL: [email or web address] Date: 03/20/2024 Prepared by: Bonna Bustard  Exercises - Seated Upper Trapezius Stretch  - 2 x daily - 7 x weekly - 1 sets - 5 reps - 15 hold - Gentle Levator Scapulae Stretch (Mirrored)  - 2 x daily - 7 x weekly - 1 sets - 5 reps - 15 hold - Standing Shoulder Posterior Capsule Stretch (Mirrored)  - 2 x daily - 7 x weekly - 1 sets - 5 reps - 15 hold - Standing Isometric Shoulder External Rotation with Doorway (Mirrored)  -  1-2 x daily - 7 x weekly - 1 sets - 10 reps - 5 hold - Standing Isometric Shoulder Abduction with Doorway - Arm Bent (Mirrored)  - 1-2 x daily - 7 x weekly - 1 sets - 10 reps - 5-10 hold - Standing Bilateral Low Shoulder Row with Anchored Resistance  - 1-2 x daily - 7 x weekly - 2-3 sets - 10-15 reps - Shoulder Extension with Resistance  - 1-2 x daily - 7 x weekly - 1-2 sets - 10-15 reps - Prone Scapular Retraction  - 1 x daily - 7 x weekly - 1 sets - 10 reps - 5 hold - Prone  Scapular Slide with Shoulder Extension  - 1 x daily - 7 x weekly - 1 sets - 10 reps - 5 hold - Prone Scapular Retraction Arms at Side  - 1 x daily - 7 x weekly - 1 sets - 10 reps - 5 hold  ASSESSMENT:  CLINICAL IMPRESSION: Patient is a 68 y.o. who comes to clinic with complaints of Rt neck, Rt shoulder  with mobility, strength and movement coordination deficits that impair their ability to perform usual daily and recreational functional activities without increase difficulty/symptoms at this time.  Patient to benefit from skilled PT services to address impairments and limitations to improve to previous level of function without restriction secondary to condition.    OBJECTIVE IMPAIRMENTS: decreased activity tolerance, decreased coordination, decreased endurance, decreased mobility, decreased ROM, decreased strength, increased fascial restrictions, impaired perceived functional ability, increased muscle spasms, impaired flexibility, impaired UE functional use, improper body mechanics, postural dysfunction, and pain.   ACTIVITY LIMITATIONS: carrying, lifting, bending, sitting, sleeping, reach over head, and hygiene/grooming  PARTICIPATION LIMITATIONS: meal prep, cleaning, laundry, interpersonal relationship, driving, shopping, community activity, and house reno  PERSONAL FACTORS: Time since onset of injury/illness/exacerbation and Arthritis, history of Rt breast cancer, DM, osteopenia, raynauds disease, history of ED visits related to migraines  are also affecting patient's functional outcome.   REHAB POTENTIAL: Good  CLINICAL DECISION MAKING: Evolving/moderate complexity  EVALUATION COMPLEXITY: Moderate   GOALS: Goals reviewed with patient? Yes  SHORT TERM GOALS: (target date for Short term goals are 3 weeks 04/10/2024)  1.Patient will demonstrate independent use of home exercise program to maintain progress from in clinic treatments. Goal status: New  LONG TERM GOALS: (target dates for  all long term goals are 10 weeks  05/29/2024 )   1. Patient will demonstrate/report pain at worst less than or equal to 2/10 to facilitate minimal limitation in daily activity secondary to pain symptoms. Goal status: New   2. Patient will demonstrate independent use of home exercise program to facilitate ability to maintain/progress functional gains from skilled physical therapy services. Goal status: New   3. Patient will demonstrate Patient specific functional scale avg > or = 8/10 to indicate reduced disability due to condition.  Goal status: New   4.  Patient will demonstrate cervical AROM WFL s symptoms to facilitate usual head movements for daily activity including driving, self care.   Goal status: New   5.  Patient will demonstrate bilateral shoulder MMT 5/5 throughout s symptoms to facilitate usual lifting around house at [facility].   Goal status: New   6.  Patient will demonstrate ability to sleep s restriction for symptoms.  Goal status: New     PLAN:  PT FREQUENCY: 1-2x/week  PT DURATION: 10 weeks  Can include 29562- PT Re-evaluation, 97110-Therapeutic exercises, 97530- Therapeutic activity, [id number]- Neuromuscular re-education, 97535- Self Care,  16109- Manual therapy, [id number]- Gait training, 60454- Orthotic Fit/training, 09811- Canalith repositioning, 91478- Aquatic Therapy, [phone] Electrical stimulation (unattended), [id number]- Electrical stimulation (manual), [id number]- Vasopneumatic device, [id number]- Ultrasound, [id number] Physical performance testing, 13086- Traction (mechanical), [phone]- Ionotophoresis 4mg /ml Dexamethasone , Patient/Family education, Balance training, Stair training, Taping, Dry Needling, Joint mobilization, Joint manipulation, Spinal manipulation, Spinal mobilization, Scar mobilization, Vestibular training, Visual/preceptual remediation/compensation, DME instructions, Cryotherapy, and Moist heat.  All performed as medically necessary.  All included unless contraindicated  PLAN FOR  NEXT SESSION: Check HEP use/response.  Myofascial release techniques for Rt neck/shoulder as necessary    Bonna Bustard, PT, DPT, OCS, ATC 03/20/24  4:11 PM

## 2024-03-20 ENCOUNTER — Encounter: Payer: Self-pay | Admitting: *Deleted

## 2024-03-20 ENCOUNTER — Ambulatory Visit: Admitting: Rehabilitative and Restorative Service Providers"

## 2024-03-20 ENCOUNTER — Encounter: Payer: Self-pay | Admitting: Rehabilitative and Restorative Service Providers"

## 2024-03-20 DIAGNOSIS — M6281 Muscle weakness (generalized): Secondary | ICD-10-CM

## 2024-03-20 DIAGNOSIS — M25511 Pain in right shoulder: Secondary | ICD-10-CM | POA: Diagnosis not present

## 2024-03-20 DIAGNOSIS — M542 Cervicalgia: Secondary | ICD-10-CM | POA: Diagnosis not present

## 2024-03-20 DIAGNOSIS — M546 Pain in thoracic spine: Secondary | ICD-10-CM | POA: Diagnosis not present

## 2024-03-20 DIAGNOSIS — R293 Abnormal posture: Secondary | ICD-10-CM

## 2024-03-20 DIAGNOSIS — G8929 Other chronic pain: Secondary | ICD-10-CM

## 2024-03-20 NOTE — Telephone Encounter (Signed)
 LVM asking pt to call back to schedule follow up with Dr. Lucia Gaskins

## 2024-03-21 ENCOUNTER — Other Ambulatory Visit: Payer: Self-pay | Admitting: Neurology

## 2024-03-22 ENCOUNTER — Encounter: Payer: Self-pay | Admitting: Physical Medicine and Rehabilitation

## 2024-03-22 ENCOUNTER — Ambulatory Visit: Admitting: Physical Medicine and Rehabilitation

## 2024-03-22 DIAGNOSIS — G8929 Other chronic pain: Secondary | ICD-10-CM

## 2024-03-22 DIAGNOSIS — M546 Pain in thoracic spine: Secondary | ICD-10-CM

## 2024-03-22 DIAGNOSIS — M7918 Myalgia, other site: Secondary | ICD-10-CM

## 2024-03-22 DIAGNOSIS — M5442 Lumbago with sciatica, left side: Secondary | ICD-10-CM

## 2024-03-22 DIAGNOSIS — M5416 Radiculopathy, lumbar region: Secondary | ICD-10-CM | POA: Diagnosis not present

## 2024-03-22 NOTE — Progress Notes (Signed)
 Pain Scale   Average Pain 0 Patient advising that at times she has a sharp pain in her right side of buttocks and it affects her also at night         +Driver, -BT, -Dye Allergies.

## 2024-03-22 NOTE — Progress Notes (Signed)
 [name] - 68 y.o. female MRN [id number]  Date of birth: 10/24/1956  Office Visit Note: Visit Date: 03/22/2024 PCP: Clemens Curt, MD Referred by: Tower, Manley Seeds, MD  Subjective: Chief Complaint  Patient presents with   Lower Back - Pain   HPI: Jennifer [name] is a 68 y.o. female who comes in today for evaluation of chronic, worsening and severe left sided lower back pain radiating up to thoracic back and buttock region. Pain started about 1 months ago, she reports previous episode of pain several years ago that was more self limiting. Her pain is intermittent, no specific aggravating factors. States she has episodes of pain that last about 10 minutes and then resolve. She describes pain as sore and aching sensation, currently rates as 5 out of 10. Some relief of pain with home exercise regimen, rest and use of medications. Lumbar radiographs from 2021 shows partially lumbarized S1, multi level degenerative changes and facet arthropathy at L5-S1. No spondylolisthesis. No pars defects. She was recently evaluated for chronic right shoulder pain by our partner Dr. Shauna Del. She underwent right subacromial bursa injection with significant relief of pain. She recently started PT for right shoulder. Patient denies focal weakness, numbness and tingling. No recent trauma or falls.    She has history of chronic migraine, currently managed by Dr. Aldona Amel with [facility]. She was recently diagnosed with osteoporosis, undergoing Prolia  injections.      Review of Systems  Musculoskeletal:  Positive for back pain.  Neurological:  Negative for tingling, sensory change, focal weakness and weakness.  All other systems reviewed and are negative.  Otherwise per HPI.  Assessment & Plan: Visit Diagnoses:    ICD-10-CM   1. Chronic left-sided low back pain with left-sided sciatica  M54.42 MR LUMBAR SPINE WO CONTRAST   G89.29     2. Chronic buttock pain  M79.18 MR LUMBAR SPINE WO CONTRAST    G89.29     3. Lumbar radiculopathy  M54.16 MR LUMBAR SPINE WO CONTRAST    4. Chronic left-sided thoracic back pain  M54.6 MR LUMBAR SPINE WO CONTRAST   G89.29     5. Myofascial pain syndrome  M79.18 MR LUMBAR SPINE WO CONTRAST       Plan: Findings:  Chronic, worsening and severe left-sided lower back pain radiating up to left thoracic back and down to left buttock.  Patient continues to have pain despite good conservative therapy such as home exercise regimen, rest and use of medications.  Patient's clinical presentation and exam are complex, differentials include lumbar radiculopathy and myofascial pain syndrome.  Prior lumbar radiographs do show multilevel degenerative changes and facet arthropathy at L5-S1.  Her symptoms do seem to fit with more of an S1 dermatome.  I discussed treatment plan with her today in detail, next step is to obtain lumbar MRI imaging.  Depending on results of MRI imaging we discussed the possibility of performing lumbar injections. She does have mild myofascial tenderness to left lower back and gluteal regions upon palpation. I will see her back for lumbar MRI review and to discuss treatment options. No red flag symptoms noted upon exam today.    Meds & Orders: No orders of the defined types were placed in this encounter.   Orders Placed This Encounter  Procedures   MR LUMBAR SPINE WO CONTRAST    Follow-up: Return for Lumbar MRI review.   Procedures: No procedures performed      Clinical History: CT cervical  spine without contrast.  COMPARISON: None.  INDICATION: Increasing headaches. Recent posterior head trauma.  TECHNIQUE: Dose lowering technique(s) such as automated exposure control, iterative reconstruction, and mA and/or kV adjustment for patient size was utilized for this examination.  FINDINGS:  CT HEAD:  Normal overall brain volume for age.  No midline shift or hydrocephalus.  No hemorrhages or skull fractures or acute  infarctions.  Visualized paranasal sinuses and mastoid air cells clear.  CT cervical spine:  Mild rightward scoliosis mid to lower cervical spine.  No acute fractures or subluxations.  Multilevel spondylosis with moderate right neural foraminal narrowing C3-4. Moderate to severe left neural foraminal narrowing C5-6.  Mild spinal stenosis.  No thickening of prevertebral soft tissues.  Biapical scarring.  IMPRESSION:  No acute intracranial process.  Cervical spondylosis. No acute fracture or subluxation.   Dictated on: 01/25/2023 3:44 PM Signed by: Severa Daniels, M.D.  01/25/2023 3:54 PM Exam End: 01/25/23 14:56 Last Resulted: 01/25/23 15:54 Received From: [facility]   She reports that she has never smoked. She has never been exposed to tobacco smoke. She has never used smokeless tobacco.  Recent Labs    09/17/23 0943  HGBA1C 5.7    Objective:  VS:  HT:    WT:   BMI:     BP:   HR: bpm  TEMP: ( )  RESP:  Physical Exam Vitals and nursing note reviewed.  HENT:     Head: Normocephalic and atraumatic.     Right Ear: External ear normal.     Left Ear: External ear normal.     Nose: Nose normal.     Mouth/Throat:     Mouth: Mucous membranes are moist.  Eyes:     Extraocular Movements: Extraocular movements intact.  Cardiovascular:     Rate and Rhythm: Normal rate.     Pulses: Normal pulses.  Pulmonary:     Effort: Pulmonary effort is normal.  Abdominal:     General: Abdomen is flat. There is no distension.  Musculoskeletal:        General: Tenderness present.     Cervical back: Normal range of motion.     Comments: Patient rises from seated position to standing without difficulty. Good lumbar range of motion. No pain noted with facet loading. 5/5 strength noted with bilateral hip flexion, knee flexion/extension, ankle dorsiflexion/plantarflexion and EHL. No clonus noted bilaterally. No pain upon palpation of greater trochanters. No pain with internal/external  rotation of bilateral hips. Sensation intact bilaterally. Myofascial tenderness noted to left lower back and gluteal regions upon palpation. Dysesthesias noted to left S1 dermatome. Negative slump test bilaterally. Ambulates without aid, gait steady.     Skin:    General: Skin is warm and dry.     Capillary Refill: Capillary refill takes less than 2 seconds.  Neurological:     General: No focal deficit present.     Mental Status: She is alert and oriented to person, place, and time.  Psychiatric:        Mood and Affect: Mood normal.        Behavior: Behavior normal.     Ortho Exam  Imaging: No results found.  Past Medical/Family/Surgical/Social History: Medications & Allergies reviewed per EMR, new medications updated. Patient Active Problem List   Diagnosis Date Noted   Cerumen impaction 11/10/2023   Current use of proton pump inhibitor 09/12/2023   Chronic migraine without aura, with intractable migraine, so stated, with status migrainosus 07/29/2023   Migraine without aura  and without status migrainosus, not intractable 07/29/2023   Thoracic back pain 01/21/2023   Pain in right shoulder 08/12/2022   Raynaud's disease 03/17/2022   Iron  deficiency anemia 09/08/2020   Common bile duct dilatation 09/06/2020   History of breast cancer 03/01/2020   Genetic testing 11/14/2019   Family history of prostate cancer    Family history of breast cancer    Malignant neoplasm of upper-inner quadrant of right breast in female, estrogen receptor positive (HCC) 09/20/2018   B12 deficiency 04/13/2018   Joint pain 04/11/2018   Colon cancer screening 09/16/2017   Prediabetes 07/28/2016   Allergic rhinitis 11/13/2014   Hyperlipidemia, mild 09/26/2014   Routine general medical examination at a health care facility 10/28/2012   GERD 09/16/2010   Hypothyroidism 05/07/2008   Osteoporosis 05/07/2008   Migraine with aura 11/11/2007   Past Medical History:  Diagnosis Date   Allergic rhinitis     Allergy    seasonal   Anemia    Anxiety    Arthritis    hands, knees   Asthma    "as a child"   Blood transfusion without reported diagnosis    "as a child"   Breast cancer (HCC) 2019   Right Breast Cancer   Cancer (HCC) 09/2018   Breast CA new DX, right breast   Cervical dysplasia    Common bile duct dilation    Diabetes mellitus without complication (HCC)    Endometriosis    Esophageal reflux    Family history of adverse reaction to anesthesia    younger sister anxiety for a dew days after   Family history of breast cancer    Family history of breast cancer    Family history of prostate cancer    Hepatic cyst 08/2020   multiple   Hepatic hemangioma 08/2020   intrahepatic hemangioma   Hiatal hernia    HSV-1 (herpes simplex virus 1) infection    Hyperlipidemia    Hypertension    pt.denies 10/19/22   Hypothyroid    Migraine with aura, without mention of intractable migraine without mention of status migrainosus    Osteopenia 08/2019   T score -2.2 FRAX 11% / 1.7%   Osteoporosis 05/07/2008   Qualifier: Diagnosis of  By: Malissa Se MD, Dannette Dutch    Personal history of radiation therapy 2019   Right Breast Cancer   Pre-diabetes    Raynaud's disease    Family History  Problem Relation Age of Onset   Breast cancer Mother 81   Diabetes Mother    Irritable bowel syndrome Mother    Cancer Mother    Hearing loss Mother    Hyperlipidemia Mother    Miscarriages / Stillbirths Mother    Hypertension Father    Heart disease Father    Hypertension Sister    Anuerysm Sister        head   Depression Sister    Irritable bowel syndrome Sister    Hypertension Sister    Anxiety disorder Sister    Other Sister        prediabetes   Celiac disease Brother    Diabetes Brother    Osteoporosis Maternal Grandmother    Diabetes Maternal Grandmother    Arthritis Maternal Grandmother    COPD Paternal Grandmother    Heart disease Paternal Grandfather    Breast cancer Cousin 3        pat first cousin   Breast cancer Cousin        mat second cousin  with breast cancer in her 30s   Thyroid  cancer Cousin 67       pat first cousin   Prostate cancer Other 69   Colon cancer Neg Hx    Stomach cancer Neg Hx    Esophageal cancer Neg Hx    Pancreatic cancer Neg Hx    Rectal cancer Neg Hx    Colon polyps Neg Hx    Crohn's disease Neg Hx    Ulcerative colitis Neg Hx    Past Surgical History:  Procedure Laterality Date   ABDOMINAL HYSTERECTOMY     BIOPSY  09/12/2020   Procedure: BIOPSY;  Surgeon: Janel Medford, MD;  Location: [facility];  Service: Endoscopy;;   BREAST BIOPSY Left 11/27/2022   MM LT BREAST BX W LOC DEV 1ST LESION IMAGE BX SPEC STEREO GUIDE 11/27/2022 [facility]   BREAST EXCISIONAL BIOPSY Bilateral over 10 years ago   benign x 2    BREAST LUMPECTOMY Right 10/10/2018   BREAST LUMPECTOMY WITH RADIOACTIVE SEED AND SENTINEL LYMPH NODE BIOPSY Right 10/10/2018   Procedure: RIGHT BREAST LUMPECTOMY WITH RADIOACTIVE SEED AND RIGHT SENTINEL LYMPH NODE BIOPSY;  Surgeon: Lillette Reid III, MD;  Location: [facility];  Service: General;  Laterality: Right;   BREAST SURGERY     Benign breast lump excised   CERVICAL CONE BIOPSY  1985   severe dysplasia   COLONOSCOPY  2005 and dec 2019   normal   COLPOSCOPY     endoscopy  10/2018   ESOPHAGOGASTRODUODENOSCOPY (EGD) WITH PROPOFOL  N/A 09/12/2020   Procedure: ESOPHAGOGASTRODUODENOSCOPY (EGD) WITH PROPOFOL ;  Surgeon: Janel Medford, MD;  Location: [facility];  Service: Endoscopy;  Laterality: N/A;   EUS N/A 09/12/2020   Procedure: UPPER ENDOSCOPIC ULTRASOUND (EUS) RADIAL;  Surgeon: Janel Medford, MD;  Location: [facility];  Service: Endoscopy;  Laterality: N/A;   LAPAROSCOPIC ASSISTED VAGINAL HYSTERECTOMY     LAPAROSCOPIC BILATERAL SALPINGO OOPHERECTOMY Bilateral 10/27/2019   Procedure: LAPAROSCOPIC BILATERAL SALPINGO OOPHORECTOMY WITH PERITONEAL WASHINGS;  Surgeon: Lavoie, Marie-Lyne, MD;   Location: [facility] [place];  Service: Gynecology;  Laterality: Bilateral;  request 1:00pm on Friday, Dec. 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

## 2024-03-27 ENCOUNTER — Ambulatory Visit: Admitting: Rehabilitative and Restorative Service Providers"

## 2024-03-27 ENCOUNTER — Encounter: Payer: Self-pay | Admitting: Rehabilitative and Restorative Service Providers"

## 2024-03-27 ENCOUNTER — Ambulatory Visit
Admission: RE | Admit: 2024-03-27 | Discharge: 2024-03-27 | Disposition: A | Discharge status: Home / Self Care | Source: Ambulatory Visit | Attending: Physical Medicine and Rehabilitation | Admitting: Physical Medicine and Rehabilitation

## 2024-03-27 DIAGNOSIS — M546 Pain in thoracic spine: Secondary | ICD-10-CM | POA: Diagnosis not present

## 2024-03-27 DIAGNOSIS — M542 Cervicalgia: Secondary | ICD-10-CM | POA: Diagnosis not present

## 2024-03-27 DIAGNOSIS — R293 Abnormal posture: Secondary | ICD-10-CM | POA: Diagnosis not present

## 2024-03-27 DIAGNOSIS — G8929 Other chronic pain: Secondary | ICD-10-CM

## 2024-03-27 DIAGNOSIS — R937 Abnormal findings on diagnostic imaging of other parts of musculoskeletal system: Secondary | ICD-10-CM | POA: Diagnosis not present

## 2024-03-27 DIAGNOSIS — M545 Low back pain, unspecified: Secondary | ICD-10-CM | POA: Diagnosis not present

## 2024-03-27 DIAGNOSIS — M6281 Muscle weakness (generalized): Secondary | ICD-10-CM | POA: Diagnosis not present

## 2024-03-27 DIAGNOSIS — M25511 Pain in right shoulder: Secondary | ICD-10-CM | POA: Diagnosis not present

## 2024-03-27 DIAGNOSIS — M5416 Radiculopathy, lumbar region: Secondary | ICD-10-CM

## 2024-03-27 DIAGNOSIS — M7918 Myalgia, other site: Secondary | ICD-10-CM

## 2024-03-27 NOTE — Patient Instructions (Signed)

## 2024-03-27 NOTE — Therapy (Signed)
 OUTPATIENT PHYSICAL THERAPY TREATMENT   Patient Name: SAMARIYA DENTICE MRN: [id number] DOB:March 01, 1956, 68 y.o., female Today's Date: 03/27/2024  END OF SESSION:  PT End of Session - 03/27/24 1153     Visit Number 2    Number of Visits 20    Date for PT Re-Evaluation 05/29/24    Authorization Type [employer]    Progress Note Due on Visit 10    PT Start Time 1148    PT Stop Time 1215    PT Time Calculation (min) 27 min    Activity Tolerance Patient limited by pain   migraine   Behavior During Therapy WFL for tasks assessed/performed              Past Medical History:  Diagnosis Date   Allergic rhinitis    Allergy    seasonal   Anemia    Anxiety    Arthritis    hands, knees   Asthma    "as a child"   Blood transfusion without reported diagnosis    "as a child"   Breast cancer (HCC) 2019   Right Breast Cancer   Cancer (HCC) 09/2018   Breast CA new DX, right breast   Cervical dysplasia    Common bile duct dilation    Diabetes mellitus without complication (HCC)    Endometriosis    Esophageal reflux    Family history of adverse reaction to anesthesia    younger sister anxiety for a dew days after   Family history of breast cancer    Family history of breast cancer    Family history of prostate cancer    Hepatic cyst 08/2020   multiple   Hepatic hemangioma 08/2020   intrahepatic hemangioma   Hiatal hernia    HSV-1 (herpes simplex virus 1) infection    Hyperlipidemia    Hypertension    pt.denies 10/19/22   Hypothyroid    Migraine with aura, without mention of intractable migraine without mention of status migrainosus    Osteopenia 08/2019   T score -2.2 FRAX 11% / 1.7%   Osteoporosis 05/07/2008   Qualifier: Diagnosis of  By: Malissa Se MD, Dannette Dutch    Personal history of radiation therapy 2019   Right Breast Cancer   Pre-diabetes    Raynaud's disease    Past Surgical History:  Procedure Laterality Date   ABDOMINAL HYSTERECTOMY     BIOPSY   09/12/2020   Procedure: BIOPSY;  Surgeon: Janel Medford, MD;  Location: [facility];  Service: Endoscopy;;   BREAST BIOPSY Left 11/27/2022   MM LT BREAST BX W LOC DEV 1ST LESION IMAGE BX SPEC STEREO GUIDE 11/27/2022 [facility]   BREAST EXCISIONAL BIOPSY Bilateral over 10 years ago   benign x 2    BREAST LUMPECTOMY Right 10/10/2018   BREAST LUMPECTOMY WITH RADIOACTIVE SEED AND SENTINEL LYMPH NODE BIOPSY Right 10/10/2018   Procedure: RIGHT BREAST LUMPECTOMY WITH RADIOACTIVE SEED AND RIGHT SENTINEL LYMPH NODE BIOPSY;  Surgeon: Lillette Reid III, MD;  Location:  SUR[facility]ERY CENTER;  Service: General;  Laterality: Right;   BREAST SURGERY     Benign breast lump excised   CERVICAL CONE BIOPSY  1985   severe dysplasia   COLONOSCOPY  2005 and dec 2019   normal   COLPOSCOPY     endoscopy  10/2018   ESOPHAGOGASTRODUODENOSCOPY (EGD) WITH PROPOFOL  N/A 09/12/2020   Procedure: ESOPHAGOGASTRODUODENOSCOPY (EGD) WITH PROPOFOL ;  Surgeon: Janel Medford, MD;  Location: [facility];  Service: Endoscopy;  Laterality:  N/A;   EUS N/A 09/12/2020   Procedure: UPPER ENDOSCOPIC ULTRASOUND (EUS) RADIAL;  Surgeon: Janel Medford, MD;  Location: [facility];  Service: Endoscopy;  Laterality: N/A;   LAPAROSCOPIC ASSISTED VAGINAL HYSTERECTOMY     LAPAROSCOPIC BILATERAL SALPINGO OOPHERECTOMY Bilateral 10/27/2019   Procedure: LAPAROSCOPIC BILATERAL SALPINGO OOPHORECTOMY WITH PERITONEAL WASHINGS;  Surgeon: Lavoie, Marie-Lyne, MD;  Location: [facility];  Service: Gynecology;  Laterality: Bilateral;  request 1:00pm on Friday, Dec. 4th in [place] [facility] Iqueue time held requests one hour OR time   moles removed from upper back, face lft, 1 between breasts, upper leg left inside  10/18/2019   wearing small round bandaids on for 2 weeks   ROTATOR CUFF REPAIR Bilateral 08/2008   UPPER GASTROINTESTINAL ENDOSCOPY     Patient Active Problem List   Diagnosis Date Noted   Cerumen impaction  11/10/2023   Current use of proton pump inhibitor 09/12/2023   Chronic migraine without aura, with intractable migraine, so stated, with status migrainosus 07/29/2023   Migraine without aura and without status migrainosus, not intractable 07/29/2023   Thoracic back pain 01/21/2023   Pain in right shoulder 08/12/2022   Raynaud's disease 03/17/2022   Iron  deficiency anemia 09/08/2020   Common bile duct dilatation 09/06/2020   History of breast cancer 03/01/2020   Genetic testing 11/14/2019   Family history of prostate cancer    Family history of breast cancer    Malignant neoplasm of upper-inner quadrant of right breast in female, estrogen receptor positive (HCC) 09/20/2018   B12 deficiency 04/13/2018   Joint pain 04/11/2018   Colon cancer screening 09/16/2017   Prediabetes 07/28/2016   Allergic rhinitis 11/13/2014   Hyperlipidemia, mild 09/26/2014   Routine general medical examination at a health care facility 10/28/2012   GERD 09/16/2010   Hypothyroidism 05/07/2008   Osteoporosis 05/07/2008   Migraine with aura 11/11/2007    PCP: Clemens Curt MD  REFERRING PROVIDER: Shauna Del, DO  REFERRING DIAG: [phone] (ICD-10-CM) - Chronic right shoulder pain M12.811 (ICD-10-CM) - Rotator cuff arthropathy of right shoulder M62.830 (ICD-10-CM) - Spasm of right trapezius muscle  THERAPY DIAG:  Cervicalgia  Pain in thoracic spine  Chronic right shoulder pain  Abnormal posture  Muscle weakness (generalized)  Rationale for Evaluation and Treatment: Rehabilitation  ONSET DATE: Acute on Chronic, Feb 2025 worsening.   SUBJECTIVE:  SUBJECTIVE STATEMENT: Pt indicated having migraines from going out of town, taking medicine for it.  Pt indicated having some improvement in Rt  shoulder since last visit.   PERTINENT HISTORY:  Arthritis, history of Rt breast cancer, DM, osteopenia, raynauds disease, history of ED visits related to migraines  PAIN:  NPRS scale: pain at worst in last 2-3 days : 0/10, some soreness.  Pain location: Rt shoulder Pain description: constant, sharp, tightness at times.  Aggravating factors: Rt arm movement, lifting, head movements at times.  Relieving factors: injection  PRECAUTIONS: None  RED FLAGS: None  WEIGHT BEARING RESTRICTIONS: No  FALLS:  Has patient fallen in last 6 months? 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    OBJECTIVE:   PATIENT SURVEYS: Patient-Specific Activity Scoring Scheme  "0" represents "unable to perform." "10" represents "able to perform at prior level. 0 1 2 3 4 5 6 7 8 9  10 (Date and Score)   Activity Eval  03/20/2024    1. Reaching overhead 7     2. Lifting household items  6    3. Sleeping 5   4.    5.    Score 6 avg    Total score = sum of the activity scores/number of activities Minimum detectable change (90%CI) for average score = 2 points Minimum detectable change (90%CI) for single activity score = 3 points   COGNITION: 03/20/2024 Overall cognitive status: Within functional limits for tasks assessed  SENSATION: 03/20/2024 [facility]  POSTURE:  03/20/2024 rounded shoulders, forward head, and increased thoracic kyphosis  PALPATION: 03/20/2024 Trigger points, palpation tenderness with concordant symptoms Rt upper trap, Rt levator scap   CERVICAL ROM:   ROM AROM (deg) Eval 03/20/2024  Flexion 57  Extension 50 c Rt neck pulling   Right lateral flexion   Left lateral flexion   Right rotation 72 c Rt neck pain  Left rotation 68   (Blank rows = not tested)  UPPER EXTREMITY ROM:    ROM Right Eval 03/20/2024 Left Eval 03/20/2024  Shoulder flexion [facility]  Shoulder extension    Shoulder abduction    Shoulder adduction    Shoulder extension    Shoulder internal rotation WFL   Shoulder external rotation WFL   Elbow flexion    Elbow extension    Wrist flexion    Wrist extension    Wrist ulnar deviation    Wrist radial deviation    Wrist pronation    Wrist supination     (Blank rows = not tested)  UPPER EXTREMITY MMT:  MMT Right Eval 03/20/2024 Left Eval 03/20/2024  Shoulder flexion 4+/5 5/5  Shoulder extension    Shoulder abduction 3+/5 c pain 4/5  Shoulder adduction    Shoulder extension    Shoulder internal rotation 5/5 5/5  Shoulder external rotation 4/5 5/5  Middle trapezius    Lower trapezius    Elbow flexion 5/5 5/5  Elbow extension 5/5 5/5  Wrist flexion    Wrist extension    Wrist ulnar deviation    Wrist radial deviation    Wrist pronation    Wrist supination    Grip strength     (Blank rows = not tested)  SPECIAL TESTS:  03/20/2024 No cervical testing indicated. (-) drop arm on Rt, (+) painful arc in lowering on Rt shoulder elevation  FUNCTIONAL TESTS:  03/20/2024 No specific testing today                                                                                                                                                                                 TODAY'S TREATMENT:                                                                                                       DATE: 03/27/2024 Neuro Re-ed (to improve postural recruitment/activation/awareness) Prone scapular retraction 5 sec hold x 10  Prone scapular retraction with GH ext bilateral 5 sec hold x 10  Supine cervical retraction isometric hold 5 sec x 10   Manual Lt sidelying Rt infraspinatus, Rt upper trap, Rt levator percussive device for STM   Trigger Point Dry Needling Initial Treatment: Pt instructed on Dry Needling rational, procedures, and possible side  effects. Pt instructed to expect mild to moderate muscle soreness later in the day and/or into the next day.  Pt instructed in methods to reduce muscle soreness. Pt instructed to continue prescribed HEP. Because Dry Needling was performed over or adjacent to a lung field, pt was educated on S/S of pneumothorax and to seek immediate medical attention should they occur.  Patient was educated on signs and symptoms of infection and other risk factors and advised to seek medical attention should they occur.  Patient verbalized understanding of these instructions and education.  Patient Verbal Consent Given: Yes Education Handout Provided: Previously Provided Muscles treated: Rt upper trap  Treatment response/outcome: local twitch response     TODAY'S TREATMENT:                                                                                                       DATE: 03/20/2024 Therex:    HEP instruction/performance c cues for techniques, handout provided.  Verbal review of each for comprehension and symptom assessment.  See below for exercise list  Neuro Re-ed (to improve postural recruitment/activation/awareness) Tband rows green band with scapular retraction 2 x 15 Green band GH ext 2x 15 Standing Isometric muscle activation for ER, abduction with Rt arm at side 5 sec hold x 5 each with cues for home.   Manual Lt sidelying Rt infraspinatus, Rt upper trap, Rt levator percussive device for STM, pain symptom.     PATIENT EDUCATION:  03/20/2024 Education details: HEP, POC DN (previously provided Person educated: Patient Education method: Explanation, Demonstration, Verbal cues, and Handouts Education comprehension: verbalized understanding, returned demonstration, and verbal cues required  HOME EXERCISE PROGRAM: Access Code: [email or web address] URL: [email or web address] Date: 03/20/2024 Prepared by: Bonna Bustard  Exercises - Seated Upper Trapezius Stretch  - 2 x daily - 7 x  weekly - 1 sets - 5 reps - 15 hold - Gentle Levator Scapulae Stretch (Mirrored)  - 2 x daily - 7 x weekly - 1 sets - 5 reps - 15 hold - Standing Shoulder Posterior Capsule Stretch (Mirrored)  - 2 x daily - 7 x weekly - 1 sets - 5 reps - 15 hold - Standing Isometric Shoulder External Rotation with Doorway (Mirrored)  - 1-2 x daily - 7 x weekly - 1 sets - 10 reps - 5 hold - Standing Isometric Shoulder Abduction with Doorway - Arm Bent (Mirrored)  - 1-2 x daily - 7 x weekly - 1 sets - 10 reps - 5-10 hold - Standing Bilateral Low Shoulder Row with Anchored Resistance  - 1-2 x daily - 7 x weekly - 2-3 sets - 10-15 reps - Shoulder Extension with Resistance  - 1-2 x daily - 7 x weekly - 1-2 sets - 10-15 reps - Prone Scapular Retraction  - 1 x daily - 7 x weekly - 1 sets - 10 reps - 5 hold - Prone Scapular Slide with Shoulder Extension  - 1 x daily - 7 x weekly - 1 sets - 10 reps - 5 hold - Prone Scapular Retraction Arms at Side  - 1 x daily - 7 x weekly - 1 sets - 10 reps - 5 hold  ASSESSMENT:  CLINICAL IMPRESSION: General positive response noted from initial treatment with Rt shoulder,neck complaints.  Pt indicated having migraine complaints today and recently that has impacted her presentation.  Was present during today's visit.  Reduced treatment time today due to complaints.  Will plan to progress next visit on Rt shoulder movement/strengthening if able.    OBJECTIVE IMPAIRMENTS: decreased activity tolerance, decreased coordination, decreased endurance, decreased mobility, decreased ROM, decreased strength, increased fascial restrictions, impaired perceived functional ability, increased muscle spasms, impaired flexibility, impaired UE functional use, improper body mechanics, postural dysfunction, and pain.   ACTIVITY LIMITATIONS: carrying, lifting, bending, sitting, sleeping, reach over head, and hygiene/grooming  PARTICIPATION LIMITATIONS: meal prep, cleaning, laundry, interpersonal relationship,  driving, shopping, community activity, and house reno  PERSONAL FACTORS: Time since onset of injury/illness/exacerbation and Arthritis, history of Rt breast cancer, DM, osteopenia, raynauds disease, history of ED visits related to migraines  are also affecting patient's functional outcome.   REHAB POTENTIAL: Good  CLINICAL DECISION MAKING: Evolving/moderate complexity  EVALUATION COMPLEXITY: Moderate   GOALS: Goals reviewed with patient? Yes  SHORT TERM GOALS: (target date for Short term goals are 3 weeks 04/10/2024)  1.Patient will demonstrate independent use of home exercise program to maintain progress from in clinic treatments. Goal status: New  LONG TERM GOALS: (target  dates for all long term goals are 10 weeks  05/29/2024 )   1. Patient will demonstrate/report pain at worst less than or equal to 2/10 to facilitate minimal limitation in daily activity secondary to pain symptoms. Goal status: New   2. Patient will demonstrate independent use of home exercise program to facilitate ability to maintain/progress functional gains from skilled physical therapy services. Goal status: New   3. Patient will demonstrate Patient specific functional scale avg > or = 8/10 to indicate reduced disability due to condition.  Goal status: New   4.  Patient will demonstrate cervical AROM WFL s symptoms to facilitate usual head movements for daily activity including driving, self care.   Goal status: New   5.  Patient will demonstrate bilateral shoulder MMT 5/5 throughout s symptoms to facilitate usual lifting around house at [facility].   Goal status: New   6.  Patient will demonstrate ability to sleep s restriction for symptoms.  Goal status: New     PLAN:  PT FREQUENCY: 1-2x/week  PT DURATION: 10 weeks  Can include 16109- PT Re-evaluation, 97110-Therapeutic exercises, 97530- Therapeutic activity, 97112- Neuromuscular re-education, 97535- Self Care, 97140- Manual therapy, [phone]- Gait  training, [phone]- Orthotic Fit/training, [phone]- Canalith repositioning, [id number]- Aquatic Therapy, [phone] Electrical stimulation (unattended), [phone]- Electrical stimulation (manual), [id number]- Vasopneumatic device, [id number]- Ultrasound, [id number] Physical performance testing, [id number]- Traction (mechanical), [id number]- Ionotophoresis 4mg /ml Dexamethasone , Patient/Family education, Balance training, Stair training, Taping, Dry Needling, Joint mobilization, Joint manipulation, Spinal manipulation, Spinal mobilization, Scar mobilization, Vestibular training, Visual/preceptual remediation/compensation, DME instructions, Cryotherapy, and Moist heat.  All performed as medically necessary.  All included unless contraindicated  PLAN FOR NEXT SESSION:  Progress Rt shoulder strengthening as able.   Bonna Bustard, PT, DPT, OCS, ATC 03/27/24  12:17 PM

## 2024-03-28 NOTE — Telephone Encounter (Signed)
 Pt is getting vyepti .  Ajvoy is nonformulary.   I refused the ajovy  prescription as pt getting vyepti  at this time.

## 2024-03-30 ENCOUNTER — Other Ambulatory Visit: Payer: Self-pay

## 2024-03-30 ENCOUNTER — Emergency Department
Admission: EM | Admit: 2024-03-30 | Discharge: 2024-03-30 | Disposition: A | Discharge status: Home / Self Care | Attending: Emergency Medicine | Admitting: Emergency Medicine

## 2024-03-30 DIAGNOSIS — G43809 Other migraine, not intractable, without status migrainosus: Secondary | ICD-10-CM | POA: Insufficient documentation

## 2024-03-30 DIAGNOSIS — G43909 Migraine, unspecified, not intractable, without status migrainosus: Secondary | ICD-10-CM | POA: Diagnosis present

## 2024-03-30 MED ORDER — SODIUM CHLORIDE 0.9 % IV BOLUS
500.0000 mL | Freq: Once | INTRAVENOUS | Status: AC
  Administered 2024-03-30: 500 mL via INTRAVENOUS

## 2024-03-30 MED ORDER — KETOROLAC TROMETHAMINE 15 MG/ML IJ SOLN
15.0000 mg | Freq: Once | INTRAMUSCULAR | Status: AC
  Administered 2024-03-30: 15 mg via INTRAVENOUS
  Filled 2024-03-30: qty 1

## 2024-03-30 MED ORDER — DEXAMETHASONE SODIUM PHOSPHATE 10 MG/ML IJ SOLN
10.0000 mg | Freq: Once | INTRAMUSCULAR | Status: AC
  Administered 2024-03-30: 10 mg via INTRAVENOUS
  Filled 2024-03-30: qty 1

## 2024-03-30 MED ORDER — ONDANSETRON HCL 4 MG/2ML IJ SOLN
4.0000 mg | Freq: Once | INTRAMUSCULAR | Status: AC
  Administered 2024-03-30: 4 mg via INTRAVENOUS
  Filled 2024-03-30: qty 2

## 2024-03-30 NOTE — Discharge Instructions (Signed)
 Follow up with your primary care provider as needed. Return to the ED with any worsening symptoms.

## 2024-03-30 NOTE — ED Triage Notes (Signed)
 Patient states migraine for 6 days; no relief with rescue meds.

## 2024-03-30 NOTE — ED Provider Notes (Signed)
 [facility] Provider Note    Event Date/Time   First MD Initiated Contact with Patient 03/30/24 [phone]     (approximate)   History   Migraine   HPI  Jennifer [name] is a 68 y.o. female with PMH of osteopenia, arthritis, breast cancer, endometriosis and migraines presents for evaluation of a migraine.  Patient has been seen in the ER multiple times for this.  Patient states that the quality of this headache is no different than previous ones.  She tried taking medications at home and was unable to get relief.      Physical Exam   Triage Vital Signs: ED Triage Vitals  Encounter Vitals Group     BP 03/30/24 0852 (!) 115/97     Systolic BP Percentile --      Diastolic BP Percentile --      Pulse Rate 03/30/24 0852 67     Resp 03/30/24 0852 18     Temp 03/30/24 0852 98.3 F (36.8 C)     Temp Source 03/30/24 0852 Oral     SpO2 03/30/24 0852 100 %     Weight 03/30/24 0850 128 lb (58.1 kg)     Height 03/30/24 0850 5' (1.524 m)     Head Circumference --      Peak Flow --      Pain Score 03/30/24 0850 3     Pain Loc --      Pain Education --      Exclude from Growth Chart --     Most recent vital signs: Vitals:   03/30/24 0852  BP: (!) 115/97  Pulse: 67  Resp: 18  Temp: 98.3 F (36.8 C)  SpO2: 100%   General: Awake, no distress.  CV:  Good peripheral perfusion.  Resp:  Normal effort.  Abd:  No distention.  Other:  PERRL, EOM intact, no focal neurodeficits no ataxia.   ED Results / Procedures / Treatments   Labs (all labs ordered are listed, but only abnormal results are displayed) Labs Reviewed - No data to display   PROCEDURES:  Critical Care performed: No  Procedures   MEDICATIONS ORDERED IN ED: Medications  ketorolac  (TORADOL ) 15 MG/ML injection 15 mg (15 mg Intravenous Given 03/30/24 1030)  ondansetron  (ZOFRAN ) injection 4 mg (4 mg Intravenous Given 03/30/24 1023)  dexamethasone  (DECADRON ) injection 10 mg (10 mg Intravenous  Given 03/30/24 1026)  sodium chloride  0.9 % bolus 500 mL (0 mLs Intravenous Stopped 03/30/24 1057)     IMPRESSION / MDM / ASSESSMENT AND PLAN / ED COURSE  I reviewed the triage vital signs and the nursing notes.                             68 year old female presents for evaluation of a migraine.  Diastolic blood pressure is elevated otherwise vital signs are stable.  Patient NAD on exam.  Differential diagnosis includes, but is not limited to, migraine, intracranial hemorrhage, tension headache.  Patient's presentation is most consistent with acute, uncomplicated illness.  No focal neurodeficits on exam.  Do not see any indication for CT scan today.  Since patient has been treated in the ED for this multiple times she has a specific regimen that she has found to be helpful for her.  She was given Zofran , Toradol , Decadron  and some fluids.  After administration of these medications she reported an improvement in her symptoms and stated she is ready  to be discharged.  Patient was discharged in stable condition.      FINAL CLINICAL IMPRESSION(S) / ED DIAGNOSES   Final diagnoses:  Other migraine without status migrainosus, not intractable     Rx / DC Orders   ED Discharge Orders     None        Note:  This document was prepared using Dragon voice recognition software and may include unintentional dictation errors.   Phyliss Breen, PA-C 03/30/24 1203    Jacquie Maudlin, MD 03/30/24 [phone]

## 2024-03-30 NOTE — ED Notes (Signed)
 Patient aware of medications given and states she has taken them in the past.

## 2024-04-03 ENCOUNTER — Encounter: Payer: HMO | Admitting: Obstetrics and Gynecology

## 2024-04-03 NOTE — Progress Notes (Deleted)
 68 y.o. G0P0000 Widowed Caucasian Hispanic female here for a breast and pelvic exam.    The patient is also followed for ***.  PCP: Clemens Curt, MD   No LMP recorded. Patient has had a hysterectomy.           Sexually active: No.  The current method of family planning is post menopausal status.    Menopausal hormone therapy:  n/a Exercising: {yes no:314532}  {types:19826} Smoker:  no  OB History     Gravida  0   Para  0   Term  0   Preterm  0   AB  0   Living  0      SAB  0   IAB  0   Ectopic  0   Multiple  0   Live Births  0           HEALTH MAINTENANCE: Last 2 paps: 02/02/23 neg, 01/28/22 neg HR HPV neg History of abnormal Pap or positive HPV:  no Mammogram:  02/22/24 Breast Density Cat D, BIRADS Cat 1 neg  Colonoscopy:  11/18/22 Bone Density:  11/08/23  Result  osteoporotic    Immunization History  Administered Date(s) Administered   Fluad Quad(high Dose 65+) 09/18/2022   Fluad Trivalent(High Dose 65+) 08/30/2023   Influenza Inj Mdck Quad Pf 08/11/2019   Influenza Whole 08/21/2009, 08/28/2010   Influenza, High Dose Seasonal PF 10/28/2017, 09/13/2018, 10/10/2019, 10/22/2020, 10/10/2021   Influenza,inj,Quad PF,6+ Mos 08/28/2015, 08/03/2016, 08/05/2018   Influenza-Unspecified 09/06/2017, 08/03/2020   PFIZER Comirnaty(Gray Top)Covid-19 Tri-Sucrose Vaccine 06/14/2021   PFIZER(Purple Top)SARS-COV-2 Vaccination 01/05/2020, 01/30/2020, 09/05/2020, 10/22/2020   PNEUMOCOCCAL CONJUGATE-20 11/18/2021   Pfizer Covid-19 Vaccine Bivalent Booster 98yrs & up 11/11/2021   Pneumococcal Polysaccharide-23 10/28/2017, 10/10/2019, 10/22/2020, 10/28/2020   Rsv, Bivalent, Protein Subunit Rsvpref,pf Pattricia Bores) 02/13/2024   Tdap 10/28/2012   Zoster Recombinant(Shingrix) 08/05/2018, 09/24/2018      reports that she has never smoked. She has never been exposed to tobacco smoke. She has never used smokeless tobacco. She reports that she does not drink alcohol and does  not use drugs.  Past Medical History:  Diagnosis Date   Allergic rhinitis    Allergy    seasonal   Anemia    Anxiety    Arthritis    hands, knees   Asthma    "as a child"   Blood transfusion without reported diagnosis    "as a child"   Breast cancer (HCC) 2019   Right Breast Cancer   Cancer (HCC) 09/2018   Breast CA new DX, right breast   Cervical dysplasia    Common bile duct dilation    Diabetes mellitus without complication (HCC)    Endometriosis    Esophageal reflux    Family history of adverse reaction to anesthesia    younger sister anxiety for a dew days after   Family history of breast cancer    Family history of breast cancer    Family history of prostate cancer    Hepatic cyst 08/2020   multiple   Hepatic hemangioma 08/2020   intrahepatic hemangioma   Hiatal hernia    HSV-1 (herpes simplex virus 1) infection    Hyperlipidemia    Hypertension    pt.denies 10/19/22   Hypothyroid    Migraine with aura, without mention of intractable migraine without mention of status migrainosus    Osteopenia 08/2019   T score -2.2 FRAX 11% / 1.7%   Osteoporosis 05/07/2008   Qualifier: Diagnosis of  ByMalissa Se MD, Dannette Dutch    Personal history of radiation therapy 2019   Right Breast Cancer   Pre-diabetes    Raynaud's disease     Past Surgical History:  Procedure Laterality Date   ABDOMINAL HYSTERECTOMY     BIOPSY  09/12/2020   Procedure: BIOPSY;  Surgeon: Janel Medford, MD;  Location: [facility];  Service: Endoscopy;;   BREAST BIOPSY Left 11/27/2022   MM LT BREAST BX W LOC DEV 1ST LESION IMAGE BX SPEC STEREO GUIDE 11/27/2022 [facility]   BREAST EXCISIONAL BIOPSY Bilateral over 10 years ago   benign x 2    BREAST LUMPECTOMY Right 10/10/2018   BREAST LUMPECTOMY WITH RADIOACTIVE SEED AND SENTINEL LYMPH NODE BIOPSY Right 10/10/2018   Procedure: RIGHT BREAST LUMPECTOMY WITH RADIOACTIVE SEED AND RIGHT SENTINEL LYMPH NODE BIOPSY;  Surgeon: Caralyn Chandler, MD;   Location: [facility];  Service: General;  Laterality: Right;   BREAST SURGERY     Benign breast lump excised   CERVICAL CONE BIOPSY  1985   severe dysplasia   COLONOSCOPY  2005 and dec 2019   normal   COLPOSCOPY     endoscopy  10/2018   ESOPHAGOGASTRODUODENOSCOPY (EGD) WITH PROPOFOL  N/A 09/12/2020   Procedure: ESOPHAGOGASTRODUODENOSCOPY (EGD) WITH PROPOFOL ;  Surgeon: Janel Medford, MD;  Location: [facility];  Service: Endoscopy;  Laterality: N/A;   EUS N/A 09/12/2020   Procedure: UPPER ENDOSCOPIC ULTRASOUND (EUS) RADIAL;  Surgeon: Janel Medford, MD;  Location: [facility];  Service: Endoscopy;  Laterality: N/A;   LAPAROSCOPIC ASSISTED VAGINAL HYSTERECTOMY     LAPAROSCOPIC BILATERAL SALPINGO OOPHERECTOMY Bilateral 10/27/2019   Procedure: LAPAROSCOPIC BILATERAL SALPINGO OOPHORECTOMY WITH PERITONEAL WASHINGS;  Surgeon: Lavoie, Marie-Lyne, MD;  Location: [facility] [place];  Service: Gynecology;  Laterality: Bilateral;  request 1:00pm on Friday, Dec. 4th in [place] time held requests one hour OR time   moles removed from upper back, face lft, 1 between breasts, upper leg left inside  10/18/2019   wearing small round bandaids on for 2 weeks   ROTATOR CUFF REPAIR Bilateral 08/2008   UPPER GASTROINTESTINAL ENDOSCOPY      Current Outpatient Medications  Medication Sig Dispense Refill   ALPRAZolam  (XANAX ) 0.5 MG tablet TAKE 1/2 TO 1 TABLET BY MOUTH ONCE DAILY AS NEEDED FOR SLEEP/ANXIETY 15 tablet 2   atenolol  (TENORMIN ) 25 MG tablet Take 12.5 mg by mouth 3 (three) times daily. For migraine prevention     B Complex Vitamins (B COMPLEX PO) Take by mouth.     baclofen  (LIORESAL ) 10 MG tablet Take 1 tablet (10 mg total) by mouth 3 (three) times daily. 30 each 0   CALCIUM  CARBONATE-VITAMIN D  PO Take 1 tablet by mouth daily. 1000 mg     denosumab  (PROLIA ) 60 MG/ML SOSY injection Inject 60 mg into the skin every 6 (six) months.     Eptinezumab -jjmr (VYEPTI   IV) Inject into the vein every 3 (three) months.     famotidine  (PEPCID ) 40 MG tablet Take 40 mg by mouth 2 (two) times daily.      Fremanezumab -vfrm (AJOVY ) 225 MG/1.5ML SOAJ Inject 225 mg into the skin every 30 (thirty) days. 1.5 mL 11   Fremanezumab -vfrm (AJOVY ) 225 MG/1.5ML SOAJ Inject 225mg  into skin every 30 days.  2 samples of AJOVY  NDC 1610960454, LOT [id number]  exp 11/27. 1.5 mL 1   hydrocortisone  2.5 % cream Apply to affected areas BID PRN as directed. 30 g 3   hydrOXYzine  (ATARAX ) 25  MG tablet Take 37.5 mg by mouth at bedtime as needed.     ketorolac  (TORADOL ) 10 MG tablet Take by mouth.     letrozole  (FEMARA ) 2.5 MG tablet Take 1 tablet (2.5 mg total) by mouth daily. 90 tablet 3   levocetirizine (XYZAL) 5 MG tablet Take 5 mg by mouth at bedtime.   3   levothyroxine  (SYNTHROID ) 75 MCG tablet TAKE 1 TABLET BY MOUTH EVERY DAY BEFORE BREAKFAST 90 tablet 2   magnesium  oxide (MAG-OX) 400 MG tablet Take by mouth.     Magnesium  Oxide 400 (240 Mg) MG TABS Take 400 mg by mouth daily.      metFORMIN  (GLUCOPHAGE -XR) 500 MG 24 hr tablet Take 1 tablet (500 mg total) by mouth daily with breakfast. 90 tablet 3   Multiple Vitamin (MULTIVITAMIN WITH MINERALS) TABS tablet Take 1 tablet by mouth daily.     nabumetone  (RELAFEN ) 500 MG tablet Take 500 mg by mouth 2 (two) times daily.     naratriptan  (AMERGE) 2.5 MG tablet TAKE 1 TABLET (2.5 MG TOTAL) BY MOUTH AS NEEDED FOR MIGRAINE. TAKE ONE (1) TABLET AT ONSET OF HEADACHE IF RETURNS OR DOES NOT RESOLVE, MAY REPEAT AFTER 4 HOURS DO NOT EXCEED FIVE (5) MG IN 24 HOURS. 9 tablet 1   neomycin-polymyxin b-dexamethasone  (MAXITROL) 3.5-10000-0.1 SUSP 1 drop 3 (three) times daily.     Omega-3 Fatty Acids (FISH OIL) 1000 MG CAPS Take 1,000 mg by mouth daily.      OnabotulinumtoxinA  (BOTOX  IJ) Inject 1 Dose as directed every 6 (six) weeks.      ondansetron  (ZOFRAN  ODT) 4 MG disintegrating tablet Take 1 tablet (4 mg total) by mouth every 8 (eight) hours as needed. 20  tablet 0   pantoprazole  (PROTONIX ) 20 MG tablet TAKE 1 TABLET BY MOUTH EVERY DAY 90 tablet 1   PRESCRIPTION MEDICATION Inject into the skin every 6 (six) weeks. Steroid Trigger     Probiotic Product (PROBIOTIC-10 PO) Take 1 capsule by mouth daily.      promethazine  (PHENERGAN ) 25 MG tablet Take 25 mg by mouth daily as needed.     rizatriptan  (MAXALT ) 10 MG tablet Take 1 tablet (10 mg total) by mouth once as needed for up to 10 days for migraine. May repeat in 2 hours if needed 10 tablet 0   triamcinolone  cream (KENALOG ) 0.1 % Apply to affected areas BID PRN for up to two weeks at a time as directed. 30 g 1   TURMERIC PO Take 1 tablet by mouth daily.     Zavegepant HCl (ZAVZPRET ) 10 MG/ACT SOLN Place 10 mg into the nose daily as needed (onset migraine). NDC 40981191-47, LOT [id number]  exp 11/2024 1 each 1   No current facility-administered medications for this visit.    ALLERGIES: Ciprofloxacin, Codeine phosphate, Decongestant [pseudoephedrine], Diclofenac, Diphenhydramine hcl (sleep), Flonase  [fluticasone  propionate], Fluticasone , Hydrocodone , Macrobid  [nitrofurantoin ], Nasal spray, Nasonex  [mometasone ], Reglan [metoclopramide], Topamax [topiramate], Benadryl [diphenhydramine hcl], and Sulfa antibiotics  Family History  Problem Relation Age of Onset   Breast cancer Mother 30   Diabetes Mother    Irritable bowel syndrome Mother    Cancer Mother    Hearing loss Mother    Hyperlipidemia Mother    Miscarriages / Stillbirths Mother    Hypertension Father    Heart disease Father    Hypertension Sister    Anuerysm Sister        head   Depression Sister    Irritable bowel syndrome Sister    Hypertension  Sister    Anxiety disorder Sister    Other Sister        prediabetes   Celiac disease Brother    Diabetes Brother    Osteoporosis Maternal Grandmother    Diabetes Maternal Grandmother    Arthritis Maternal Grandmother    COPD Paternal Grandmother    Heart disease Paternal Grandfather     Breast cancer Cousin 63       pat first cousin   Breast cancer Cousin        mat second cousin with breast cancer in her 30s   Thyroid  cancer Cousin 83       pat first cousin   Prostate cancer Other 63   Colon cancer Neg Hx    Stomach cancer Neg Hx    Esophageal cancer Neg Hx    Pancreatic cancer Neg Hx    Rectal cancer Neg Hx    Colon polyps Neg Hx    Crohn's disease Neg Hx    Ulcerative colitis Neg Hx     Review of Systems  PHYSICAL EXAM:  There were no vitals taken for this visit.    General appearance: alert, cooperative and appears stated age Head: normocephalic, without obvious abnormality, atraumatic Neck: no adenopathy, supple, symmetrical, trachea midline and thyroid  normal to inspection and palpation Lungs: clear to auscultation bilaterally Breasts: normal appearance, no masses or tenderness, No nipple retraction or dimpling, No nipple discharge or bleeding, No axillary adenopathy Heart: regular rate and rhythm Abdomen: soft, non-tender; no masses, no organomegaly Extremities: extremities normal, atraumatic, no cyanosis or edema Skin: skin color, texture, turgor normal. No rashes or lesions Lymph nodes: cervical, supraclavicular, and axillary nodes normal. Neurologic: grossly normal  Pelvic: External genitalia:  no lesions              No abnormal inguinal nodes palpated.              Urethra:  normal appearing urethra with no masses, tenderness or lesions              Bartholins and Skenes: normal                 Vagina: normal appearing vagina with normal color and discharge, no lesions              Cervix: no lesions              Pap taken: {yes no:314532} Bimanual Exam:  Uterus:  normal size, contour, position, consistency, mobility, non-tender              Adnexa: no mass, fullness, tenderness              Rectal exam: {yes no:314532}.  Confirms.              Anus:  normal sphincter tone, no lesions  Chaperone was present for exam:   {BSCHAPERONE:31226::"Emily F, CMA"}  ASSESSMENT: Encounter for breast and pelvic exam.   ***  PLAN: Mammogram screening discussed. Self breast awareness reviewed. Pap and HRV collected:  {yes no:314532} Guidelines for Calcium , Vitamin D , regular exercise program including cardiovascular and weight bearing exercise. Medication refills:  *** {LABS (Optional):23779} Follow up:  ***    Additional counseling given.  {yes [id number]. ***  total time was spent for this patient encounter, including preparation, face-to-face counseling with the patient, coordination of care, and documentation of the encounter in addition to doing the breast and pelvic exam.

## 2024-04-05 NOTE — Telephone Encounter (Signed)
 Message relayed to [facility] w/ [facility].

## 2024-04-07 ENCOUNTER — Ambulatory Visit: Admitting: Rehabilitative and Restorative Service Providers"

## 2024-04-07 ENCOUNTER — Encounter: Payer: Self-pay | Admitting: Rehabilitative and Restorative Service Providers"

## 2024-04-07 DIAGNOSIS — G8929 Other chronic pain: Secondary | ICD-10-CM | POA: Diagnosis not present

## 2024-04-07 DIAGNOSIS — M542 Cervicalgia: Secondary | ICD-10-CM | POA: Diagnosis not present

## 2024-04-07 DIAGNOSIS — M546 Pain in thoracic spine: Secondary | ICD-10-CM | POA: Diagnosis not present

## 2024-04-07 DIAGNOSIS — M6281 Muscle weakness (generalized): Secondary | ICD-10-CM

## 2024-04-07 DIAGNOSIS — R293 Abnormal posture: Secondary | ICD-10-CM

## 2024-04-07 DIAGNOSIS — M25511 Pain in right shoulder: Secondary | ICD-10-CM

## 2024-04-07 NOTE — Therapy (Signed)
 OUTPATIENT PHYSICAL THERAPY TREATMENT   Patient Name: Jennifer [name] MRN: [id number] DOB:Jul 18, 1956, 68 y.o., female Today's Date: 04/07/2024  END OF SESSION:  PT End of Session - 04/07/24 0939     Visit Number 3    Number of Visits 20    Date for PT Re-Evaluation 05/29/24    Authorization Type [employer]    Progress Note Due on Visit 10    PT Start Time 0930    PT Stop Time 1010    PT Time Calculation (min) 40 min    Activity Tolerance Patient tolerated treatment well    Behavior During Therapy WFL for tasks assessed/performed               Past Medical History:  Diagnosis Date   Allergic rhinitis    Allergy    seasonal   Anemia    Anxiety    Arthritis    hands, knees   Asthma    "as a child"   Blood transfusion without reported diagnosis    "as a child"   Breast cancer (HCC) 2019   Right Breast Cancer   Cancer (HCC) 09/2018   Breast CA new DX, right breast   Cervical dysplasia    Common bile duct dilation    Diabetes mellitus without complication (HCC)    Endometriosis    Esophageal reflux    Family history of adverse reaction to anesthesia    younger sister anxiety for a dew days after   Family history of breast cancer    Family history of breast cancer    Family history of prostate cancer    Hepatic cyst 08/2020   multiple   Hepatic hemangioma 08/2020   intrahepatic hemangioma   Hiatal hernia    HSV-1 (herpes simplex virus 1) infection    Hyperlipidemia    Hypertension    pt.denies 10/19/22   Hypothyroid    Migraine with aura, without mention of intractable migraine without mention of status migrainosus    Osteopenia 08/2019   T score -2.2 FRAX 11% / 1.7%   Osteoporosis 05/07/2008   Qualifier: Diagnosis of  By: Malissa Se MD, Dannette Dutch    Personal history of radiation therapy 2019   Right Breast Cancer   Pre-diabetes    Raynaud's disease    Past Surgical History:  Procedure Laterality Date   ABDOMINAL HYSTERECTOMY     BIOPSY   09/12/2020   Procedure: BIOPSY;  Surgeon: Janel Medford, MD;  Location: [facility];  Service: Endoscopy;;   BREAST BIOPSY Left 11/27/2022   MM LT BREAST BX W LOC DEV 1ST LESION IMAGE BX SPEC STEREO GUIDE 11/27/2022 [facility]   BREAST EXCISIONAL BIOPSY Bilateral over 10 years ago   benign x 2    BREAST LUMPECTOMY Right 10/10/2018   BREAST LUMPECTOMY WITH RADIOACTIVE SEED AND SENTINEL LYMPH NODE BIOPSY Right 10/10/2018   Procedure: RIGHT BREAST LUMPECTOMY WITH RADIOACTIVE SEED AND RIGHT SENTINEL LYMPH NODE BIOPSY;  Surgeon: Lillette Reid III, MD;  Location: [facility];  Service: General;  Laterality: Right;   BREAST SURGERY     Benign breast lump excised   CERVICAL CONE BIOPSY  1985   severe dysplasia   COLONOSCOPY  2005 and dec 2019   normal   COLPOSCOPY     endoscopy  10/2018   ESOPHAGOGASTRODUODENOSCOPY (EGD) WITH PROPOFOL  N/A 09/12/2020   Procedure: ESOPHAGOGASTRODUODENOSCOPY (EGD) WITH PROPOFOL ;  Surgeon: Janel Medford, MD;  Location: [facility];  Service: Endoscopy;  Laterality: N/A;  EUS N/A 09/12/2020   Procedure: UPPER ENDOSCOPIC ULTRASOUND (EUS) RADIAL;  Surgeon: Janel Medford, MD;  Location: [facility];  Service: Endoscopy;  Laterality: N/A;   LAPAROSCOPIC ASSISTED VAGINAL HYSTERECTOMY     LAPAROSCOPIC BILATERAL SALPINGO OOPHERECTOMY Bilateral 10/27/2019   Procedure: LAPAROSCOPIC BILATERAL SALPINGO OOPHORECTOMY WITH PERITONEAL WASHINGS;  Surgeon: Lavoie, Marie-Lyne, MD;  Location: [facility];  Service: Gynecology;  Laterality: Bilateral;  request 1:00pm on Friday, Dec. 4th in [place] Iqueue time held requests one hour OR time   moles removed from upper back, face lft, 1 between breasts, upper leg left inside  10/18/2019   wearing small round bandaids on for 2 weeks   ROTATOR CUFF REPAIR Bilateral 08/2008   UPPER GASTROINTESTINAL ENDOSCOPY     Patient Active Problem List   Diagnosis Date Noted   Cerumen impaction  11/10/2023   Current use of proton pump inhibitor 09/12/2023   Chronic migraine without aura, with intractable migraine, so stated, with status migrainosus 07/29/2023   Migraine without aura and without status migrainosus, not intractable 07/29/2023   Thoracic back pain 01/21/2023   Pain in right shoulder 08/12/2022   Raynaud's disease 03/17/2022   Iron  deficiency anemia 09/08/2020   Common bile duct dilatation 09/06/2020   History of breast cancer 03/01/2020   Genetic testing 11/14/2019   Family history of prostate cancer    Family history of breast cancer    Malignant neoplasm of upper-inner quadrant of right breast in female, estrogen receptor positive (HCC) 09/20/2018   B12 deficiency 04/13/2018   Joint pain 04/11/2018   Colon cancer screening 09/16/2017   Prediabetes 07/28/2016   Allergic rhinitis 11/13/2014   Hyperlipidemia, mild 09/26/2014   Routine general medical examination at a health care facility 10/28/2012   GERD 09/16/2010   Hypothyroidism 05/07/2008   Osteoporosis 05/07/2008   Migraine with aura 11/11/2007    PCP: Clemens Curt MD  REFERRING PROVIDER: Shauna Del, DO  REFERRING DIAG: [phone] (ICD-10-CM) - Chronic right shoulder pain M12.811 (ICD-10-CM) - Rotator cuff arthropathy of right shoulder M62.830 (ICD-10-CM) - Spasm of right trapezius muscle  THERAPY DIAG:  Cervicalgia  Pain in thoracic spine  Chronic right shoulder pain  Abnormal posture  Muscle weakness (generalized)  Rationale for Evaluation and Treatment: Rehabilitation  ONSET DATE: Acute on Chronic, Feb 2025 worsening.   SUBJECTIVE:  SUBJECTIVE STATEMENT: Pt reported having good days between visits. Reported soreness as chief complaint today.   PERTINENT HISTORY:  Arthritis,  history of Rt breast cancer, DM, osteopenia, raynauds disease, history of ED visits related to migraines  PAIN:  NPRS scale: pain at worst in last 2-3 days : 2/10 Pain location: Rt shoulder Pain description: constant, sharp, tightness at times.  Aggravating factors: Rt arm movement, lifting, head movements at times.  Relieving factors: injection  PRECAUTIONS: None  RED FLAGS: None  WEIGHT BEARING RESTRICTIONS: No  FALLS:  Has patient fallen in last 6 months? 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    OBJECTIVE:   PATIENT SURVEYS: Patient-Specific Activity Scoring Scheme  "0" represents "unable to perform." "10" represents "able to perform at prior level. 0 1 2 3 4 5 6 7 8 9  10 (Date and Score)   Activity Eval  03/20/2024    1. Reaching overhead 7     2. Lifting household items  6    3. Sleeping 5   4.    5.    Score 6 avg    Total score = sum of the activity scores/number of activities Minimum detectable change (90%CI) for average score = 2 points Minimum detectable change (90%CI) for single activity score = 3 points   COGNITION: 03/20/2024 Overall cognitive status: Within functional limits for tasks assessed  SENSATION: 03/20/2024 [facility]  POSTURE:  03/20/2024 rounded shoulders, forward head, and increased thoracic kyphosis  PALPATION: 03/20/2024 Trigger points, palpation tenderness with concordant symptoms Rt upper trap, Rt levator scap   CERVICAL ROM:   ROM AROM (deg) Eval 03/20/2024 AROM 04/07/2024  Flexion 57 68  Extension 50 c Rt neck pulling  60  Right lateral flexion    Left lateral flexion    Right rotation 72 c Rt neck pain 70  Left rotation 68 70   (Blank rows = not tested)  UPPER EXTREMITY ROM:   ROM Right Eval 03/20/2024 Left Eval 03/20/2024   Shoulder flexion [facility]  Shoulder extension    Shoulder abduction    Shoulder adduction    Shoulder extension    Shoulder internal rotation WFL   Shoulder external rotation WFL   Elbow flexion    Elbow extension    Wrist flexion    Wrist extension    Wrist ulnar deviation    Wrist radial deviation    Wrist pronation    Wrist supination     (Blank rows = not tested)  UPPER EXTREMITY MMT:  MMT Right Eval 03/20/2024 Left Eval 03/20/2024 Right 04/07/2024   Shoulder flexion 4+/5 5/5 5/5  Shoulder extension     Shoulder abduction 3+/5 c pain 4/5 4/5  Shoulder adduction     Shoulder extension     Shoulder internal rotation 5/5 5/5   Shoulder external rotation 4/5 5/5   Middle trapezius     Lower trapezius     Elbow flexion 5/5 5/5   Elbow extension 5/5 5/5   Wrist flexion     Wrist extension     Wrist ulnar deviation     Wrist radial deviation     Wrist pronation     Wrist supination     Grip strength      (Blank rows = not tested)  SPECIAL TESTS:  03/20/2024 No cervical testing indicated. (-)  drop arm on Rt, (+) painful arc in lowering on Rt shoulder elevation  FUNCTIONAL TESTS:  03/20/2024 No specific testing today                                                                                                                                                                                 TODAY'S TREATMENT:                                                                                                       DATE: 04/07/2024 Neuro Re-ed (to improve postural recruitment/activation/awareness) Prone scapular retraction with GH ext bilateral 5 sec hold x 10  Prone scapular retraction c horizontal abduction 5 sec hold x 10  Supine cervical retraction isometric hold 5 sec x 10  Standing green band rows c scapular retraction focus 2 x 10  Standing green band GH ext bilateral 2 x 10   Therex: UBE fwd/back 2.5 mins each way lvl 2.0 with 1 min rest between directions   Standing tband green ER c towel under arm 2 x 10 , bilaterally  Seated upper trap stretch 15 sec x 3 bilaterally   Manual Prone lying, STM c percussive device to bilateral upper trap, Rt infraspinatus, levator    Trigger Point Dry Needling Subsequent Treatment: Instructions provided previously at initial dry needling treatment.  Patient Verbal Consent Given: Yes Education Handout Provided: Previously Provided Muscles treated: Rt upper trap  Treatment response/outcome: local twitch response    TODAY'S TREATMENT:                                                                                                       DATE: 03/27/2024 Neuro Re-ed (to improve postural recruitment/activation/awareness) Prone scapular retraction 5 sec hold x 10  Prone scapular retraction with GH ext bilateral 5 sec hold x 10  Supine cervical retraction isometric hold 5 sec x 10   Manual Lt sidelying Rt infraspinatus, Rt upper trap, Rt levator percussive device for STM   Trigger Point Dry Needling Initial Treatment: Pt instructed on Dry Needling rational, procedures, and possible side effects. Pt instructed to expect mild to moderate muscle soreness later in the day and/or into the next day.  Pt instructed in methods to reduce muscle soreness. Pt instructed to continue prescribed HEP. Because Dry Needling was performed over or adjacent to a lung field, pt was educated on S/S of pneumothorax and to seek immediate medical attention should they occur.  Patient was educated on signs and symptoms of infection and other risk factors and advised to seek medical attention should they occur.  Patient verbalized understanding of these instructions and education.  Patient Verbal Consent Given: Yes Education Handout Provided: Previously Provided Muscles treated: Rt upper trap  Treatment response/outcome: local twitch response     TODAY'S TREATMENT:                                                                                                        DATE: 03/20/2024 Therex:    HEP instruction/performance c cues for techniques, handout provided.  Verbal review of each for comprehension and symptom assessment.  See below for exercise list  Neuro Re-ed (to improve postural recruitment/activation/awareness) Tband rows green band with scapular retraction 2 x 15 Green band GH ext 2x 15 Standing Isometric muscle activation for ER, abduction with Rt arm at side 5 sec hold x 5 each with cues for home.   Manual Lt sidelying Rt infraspinatus, Rt upper trap, Rt levator percussive device for STM, pain symptom.     PATIENT EDUCATION:  03/20/2024 Education details: HEP, POC DN (previously provided Person educated: Patient Education method: Explanation, Demonstration, Verbal cues, and Handouts Education comprehension: verbalized understanding, returned demonstration, and verbal cues required  HOME EXERCISE PROGRAM: Access Code: [email or web address] URL: [email or web address] Date: 03/20/2024 Prepared by: Bonna Bustard  Exercises - Seated Upper Trapezius Stretch  - 2 x daily - 7 x weekly - 1 sets - 5 reps - 15 hold - Gentle Levator Scapulae Stretch (Mirrored)  - 2 x daily - 7 x weekly - 1 sets - 5 reps - 15 hold - Standing Shoulder Posterior Capsule Stretch (Mirrored)  - 2 x daily - 7 x weekly - 1 sets - 5 reps - 15 hold - Standing Isometric Shoulder External Rotation with Doorway (Mirrored)  - 1-2 x daily - 7 x weekly - 1 sets - 10 reps - 5 hold - Standing Isometric Shoulder Abduction with Doorway - Arm Bent (Mirrored)  - 1-2 x daily - 7 x weekly - 1 sets - 10 reps - 5-10 hold - Standing Bilateral Low Shoulder Row with Anchored Resistance  - 1-2 x daily - 7 x weekly - 2-3 sets - 10-15 reps - Shoulder Extension with Resistance  - 1-2 x daily - 7 x weekly - 1-2 sets - 10-15 reps - Prone Scapular Retraction  - 1 x daily - 7 x weekly -  1 sets - 10 reps - 5 hold - Prone Scapular Slide with Shoulder Extension  -  1 x daily - 7 x weekly - 1 sets - 10 reps - 5 hold - Prone Scapular Retraction Arms at Side  - 1 x daily - 7 x weekly - 1 sets - 10 reps - 5 hold  ASSESSMENT:  CLINICAL IMPRESSION: Continued positive indicated results from combination of manual, dry needling and home program.  Mild improvements noted in cervical range measurements but reduced symptoms noted.  Continued skilled PT services indicated to continue to progress towards goals.    OBJECTIVE IMPAIRMENTS: decreased activity tolerance, decreased coordination, decreased endurance, decreased mobility, decreased ROM, decreased strength, increased fascial restrictions, impaired perceived functional ability, increased muscle spasms, impaired flexibility, impaired UE functional use, improper body mechanics, postural dysfunction, and pain.   ACTIVITY LIMITATIONS: carrying, lifting, bending, sitting, sleeping, reach over head, and hygiene/grooming  PARTICIPATION LIMITATIONS: meal prep, cleaning, laundry, interpersonal relationship, driving, shopping, community activity, and house reno  PERSONAL FACTORS: Time since onset of injury/illness/exacerbation and Arthritis, history of Rt breast cancer, DM, osteopenia, raynauds disease, history of ED visits related to migraines are also affecting patient's functional outcome.   REHAB POTENTIAL: Good  CLINICAL DECISION MAKING: Evolving/moderate complexity  EVALUATION COMPLEXITY: Moderate   GOALS: Goals reviewed with patient? Yes  SHORT TERM GOALS: (target date for Short term goals are 3 weeks 04/10/2024)  1.Patient will demonstrate independent use of home exercise program to maintain progress from in clinic treatments. Goal status: Met  LONG TERM GOALS: (target dates for all long term goals are 10 weeks  05/29/2024 )   1. Patient will demonstrate/report pain at worst less than or equal to 2/10 to facilitate minimal limitation in daily activity secondary to pain symptoms. Goal status: on going  04/07/2024   2. Patient will demonstrate independent use of home exercise program to facilitate ability to maintain/progress functional gains from skilled physical therapy services. Goal status: on going 04/07/2024   3. Patient will demonstrate Patient specific functional scale avg > or = 8/10 to indicate reduced disability due to condition.  Goal status: on going 04/07/2024   4.  Patient will demonstrate cervical AROM WFL s symptoms to facilitate usual head movements for daily activity including driving, self care.   Goal status: on going 04/07/2024   5.  Patient will demonstrate bilateral shoulder MMT 5/5 throughout s symptoms to facilitate usual lifting around house at [facility].   Goal status: on going 04/07/2024   6.  Patient will demonstrate ability to sleep s restriction for symptoms.  Goal status: on going 04/07/2024     PLAN:  PT FREQUENCY: 1-2x/week  PT DURATION: 10 weeks  Can include 40981- PT Re-evaluation, 97110-Therapeutic exercises, 97530- Therapeutic activity, 97112- Neuromuscular re-education, 97535- Self Care, 97140- Manual therapy, [phone]- Gait training, [phone]- Orthotic Fit/training, [phone]- Canalith repositioning, [id number]- Aquatic Therapy, [phone] Electrical stimulation (unattended), [phone]- Electrical stimulation (manual), [id number]- Vasopneumatic device, [id number]- Ultrasound, [id number] Physical performance testing, [id number]- Traction (mechanical), [id number]- Ionotophoresis 4mg /ml Dexamethasone , Patient/Family education, Balance training, Stair training, Taping, Dry Needling, Joint mobilization, Joint manipulation, Spinal manipulation, Spinal mobilization, Scar mobilization, Vestibular training, Visual/preceptual remediation/compensation, DME instructions, Cryotherapy, and Moist heat.  All performed as medically necessary.  All included unless contraindicated  PLAN FOR NEXT SESSION:  Manual as necessary for symptom relief.  Continued functional strenghtening.   Bonna Bustard, PT, DPT, OCS,  ATC 04/07/24  10:09 AM

## 2024-04-10 ENCOUNTER — Ambulatory Visit: Admitting: Rehabilitative and Restorative Service Providers"

## 2024-04-10 ENCOUNTER — Encounter: Payer: Self-pay | Admitting: Rehabilitative and Restorative Service Providers"

## 2024-04-10 DIAGNOSIS — M6281 Muscle weakness (generalized): Secondary | ICD-10-CM

## 2024-04-10 DIAGNOSIS — R293 Abnormal posture: Secondary | ICD-10-CM

## 2024-04-10 DIAGNOSIS — M546 Pain in thoracic spine: Secondary | ICD-10-CM

## 2024-04-10 DIAGNOSIS — M25511 Pain in right shoulder: Secondary | ICD-10-CM

## 2024-04-10 DIAGNOSIS — M542 Cervicalgia: Secondary | ICD-10-CM | POA: Diagnosis not present

## 2024-04-10 DIAGNOSIS — G8929 Other chronic pain: Secondary | ICD-10-CM

## 2024-04-10 NOTE — Therapy (Signed)
 OUTPATIENT PHYSICAL THERAPY TREATMENT   Patient Name: Jennifer [name] MRN: [id number] DOB:07/30/1956, 68 y.o., female Today's Date: 04/10/2024  END OF SESSION:  PT End of Session - 04/10/24 1611     Visit Number 4    Number of Visits 20    Date for PT Re-Evaluation 05/29/24    Authorization Type [employer]    Progress Note Due on Visit 10    PT Start Time 1603    PT Stop Time 1642    PT Time Calculation (min) 39 min    Activity Tolerance Patient tolerated treatment well    Behavior During Therapy WFL for tasks assessed/performed                Past Medical History:  Diagnosis Date   Allergic rhinitis    Allergy    seasonal   Anemia    Anxiety    Arthritis    hands, knees   Asthma    "as a child"   Blood transfusion without reported diagnosis    "as a child"   Breast cancer (HCC) 2019   Right Breast Cancer   Cancer (HCC) 09/2018   Breast CA new DX, right breast   Cervical dysplasia    Common bile duct dilation    Diabetes mellitus without complication (HCC)    Endometriosis    Esophageal reflux    Family history of adverse reaction to anesthesia    younger sister anxiety for a dew days after   Family history of breast cancer    Family history of breast cancer    Family history of prostate cancer    Hepatic cyst 08/2020   multiple   Hepatic hemangioma 08/2020   intrahepatic hemangioma   Hiatal hernia    HSV-1 (herpes simplex virus 1) infection    Hyperlipidemia    Hypertension    pt.denies 10/19/22   Hypothyroid    Migraine with aura, without mention of intractable migraine without mention of status migrainosus    Osteopenia 08/2019   T score -2.2 FRAX 11% / 1.7%   Osteoporosis 05/07/2008   Qualifier: Diagnosis of  By: Malissa Se MD, Dannette Dutch    Personal history of radiation therapy 2019   Right Breast Cancer   Pre-diabetes    Raynaud's disease    Past Surgical History:  Procedure Laterality Date   ABDOMINAL HYSTERECTOMY     BIOPSY   09/12/2020   Procedure: BIOPSY;  Surgeon: Janel Medford, MD;  Location: [facility];  Service: Endoscopy;;   BREAST BIOPSY Left 11/27/2022   MM LT BREAST BX W LOC DEV 1ST LESION IMAGE BX SPEC STEREO GUIDE 11/27/2022 [facility]   BREAST EXCISIONAL BIOPSY Bilateral over 10 years ago   benign x 2    BREAST LUMPECTOMY Right 10/10/2018   BREAST LUMPECTOMY WITH RADIOACTIVE SEED AND SENTINEL LYMPH NODE BIOPSY Right 10/10/2018   Procedure: RIGHT BREAST LUMPECTOMY WITH RADIOACTIVE SEED AND RIGHT SENTINEL LYMPH NODE BIOPSY;  Surgeon: Lillette Reid III, MD;  Location: [facility];  Service: General;  Laterality: Right;   BREAST SURGERY     Benign breast lump excised   CERVICAL CONE BIOPSY  1985   severe dysplasia   COLONOSCOPY  2005 and dec 2019   normal   COLPOSCOPY     endoscopy  10/2018   ESOPHAGOGASTRODUODENOSCOPY (EGD) WITH PROPOFOL  N/A 09/12/2020   Procedure: ESOPHAGOGASTRODUODENOSCOPY (EGD) WITH PROPOFOL ;  Surgeon: Janel Medford, MD;  Location: [facility];  Service: Endoscopy;  Laterality:  N/A;   EUS N/A 09/12/2020   Procedure: UPPER ENDOSCOPIC ULTRASOUND (EUS) RADIAL;  Surgeon: Janel Medford, MD;  Location: [facility];  Service: Endoscopy;  Laterality: N/A;   LAPAROSCOPIC ASSISTED VAGINAL HYSTERECTOMY     LAPAROSCOPIC BILATERAL SALPINGO OOPHERECTOMY Bilateral 10/27/2019   Procedure: LAPAROSCOPIC BILATERAL SALPINGO OOPHORECTOMY WITH PERITONEAL WASHINGS;  Surgeon: Lavoie, Marie-Lyne, MD;  Location: [facility];  Service: Gynecology;  Laterality: Bilateral;  request 1:00pm on Friday, Dec. 4th in [place] Iqueue time held requests one hour OR time   moles removed from upper back, face lft, 1 between breasts, upper leg left inside  10/18/2019   wearing small round bandaids on for 2 weeks   ROTATOR CUFF REPAIR Bilateral 08/2008   UPPER GASTROINTESTINAL ENDOSCOPY     Patient Active Problem List   Diagnosis Date Noted   Cerumen impaction  11/10/2023   Current use of proton pump inhibitor 09/12/2023   Chronic migraine without aura, with intractable migraine, so stated, with status migrainosus 07/29/2023   Migraine without aura and without status migrainosus, not intractable 07/29/2023   Thoracic back pain 01/21/2023   Pain in right shoulder 08/12/2022   Raynaud's disease 03/17/2022   Iron  deficiency anemia 09/08/2020   Common bile duct dilatation 09/06/2020   History of breast cancer 03/01/2020   Genetic testing 11/14/2019   Family history of prostate cancer    Family history of breast cancer    Malignant neoplasm of upper-inner quadrant of right breast in female, estrogen receptor positive (HCC) 09/20/2018   B12 deficiency 04/13/2018   Joint pain 04/11/2018   Colon cancer screening 09/16/2017   Prediabetes 07/28/2016   Allergic rhinitis 11/13/2014   Hyperlipidemia, mild 09/26/2014   Routine general medical examination at a health care facility 10/28/2012   GERD 09/16/2010   Hypothyroidism 05/07/2008   Osteoporosis 05/07/2008   Migraine with aura 11/11/2007    PCP: Clemens Curt MD  REFERRING PROVIDER: Shauna Del, DO  REFERRING DIAG: [phone] (ICD-10-CM) - Chronic right shoulder pain M12.811 (ICD-10-CM) - Rotator cuff arthropathy of right shoulder M62.830 (ICD-10-CM) - Spasm of right trapezius muscle  THERAPY DIAG:  Cervicalgia  Pain in thoracic spine  Chronic right shoulder pain  Abnormal posture  Muscle weakness (generalized)  Rationale for Evaluation and Treatment: Rehabilitation  ONSET DATE: Acute on Chronic, Feb 2025 worsening.   SUBJECTIVE:  SUBJECTIVE STATEMENT: Pt indicated better overall compared to Friday but still noting soreness.   PERTINENT HISTORY:  Arthritis, history of Rt  breast cancer, DM, osteopenia, raynauds disease, history of ED visits related to migraines  PAIN:  NPRS scale:: 2/10 Pain location: Rt shoulder Pain description: constant, sharp, tightness at times.  Aggravating factors: Rt arm movement, lifting, head movements at times.  Relieving factors: injection  PRECAUTIONS: None  RED FLAGS: None  WEIGHT BEARING RESTRICTIONS: No  FALLS:  Has patient fallen in last 6 months? 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    OBJECTIVE:   PATIENT SURVEYS: Patient-Specific Activity Scoring Scheme  "0" represents "unable to perform." "10" represents "able to perform at prior level. 0 1 2 3 4 5 6 7 8 9  10 (Date and Score)   Activity Eval  03/20/2024    1. Reaching overhead 7     2. Lifting household items  6    3. Sleeping 5   4.    5.    Score 6 avg    Total score = sum of the activity scores/number of activities Minimum detectable change (90%CI) for average score = 2 points Minimum detectable change (90%CI) for single activity score = 3 points   COGNITION: 03/20/2024 Overall cognitive status: Within functional limits for tasks assessed  SENSATION: 03/20/2024 [facility]  POSTURE:  03/20/2024 rounded shoulders, forward head, and increased thoracic kyphosis  PALPATION: 03/20/2024 Trigger points, palpation tenderness with concordant symptoms Rt upper trap, Rt levator scap   CERVICAL ROM:   ROM AROM (deg) Eval 03/20/2024 AROM 04/07/2024  Flexion 57 68  Extension 50 c Rt neck pulling  60  Right lateral flexion    Left lateral flexion    Right rotation 72 c Rt neck pain 70  Left rotation 68 70   (Blank rows = not tested)  UPPER EXTREMITY ROM:   ROM Right Eval 03/20/2024 Left Eval 03/20/2024  Shoulder flexion [facility]  Shoulder extension     Shoulder abduction    Shoulder adduction    Shoulder extension    Shoulder internal rotation WFL   Shoulder external rotation WFL   Elbow flexion    Elbow extension    Wrist flexion    Wrist extension    Wrist ulnar deviation    Wrist radial deviation    Wrist pronation    Wrist supination     (Blank rows = not tested)  UPPER EXTREMITY MMT:  MMT Right Eval 03/20/2024 Left Eval 03/20/2024 Right 04/07/2024   Shoulder flexion 4+/5 5/5 5/5  Shoulder extension     Shoulder abduction 3+/5 c pain 4/5 4/5  Shoulder adduction     Shoulder extension     Shoulder internal rotation 5/5 5/5   Shoulder external rotation 4/5 5/5   Middle trapezius     Lower trapezius     Elbow flexion 5/5 5/5   Elbow extension 5/5 5/5   Wrist flexion     Wrist extension     Wrist ulnar deviation     Wrist radial deviation     Wrist pronation     Wrist supination     Grip strength      (Blank rows = not tested)  SPECIAL TESTS:  03/20/2024 No cervical testing indicated. (-) drop arm on Rt, (+) painful arc in lowering on  Rt shoulder elevation  FUNCTIONAL TESTS:  03/20/2024 No specific testing today                                                                                                                                                                                 TODAY'S TREATMENT:                                                                                                       DATE: 04/10/2024 Neuro Re-ed (to improve postural recruitment/activation/awareness) Prone scapular retraction with GH ext bilateral 5 sec hold x 10  Seated cervical retraction into ball at wall 5 sec hold x 10  Seated scapular retraction c bilateral UE ER 2-3 sec hold x 10 , with green band x 15 total    Therex: UBE fwd/back 3 mins each way lvl 3.0 with 1 min rest between directions  Seated thoracic extension over chair with head in neutral 2-3 sec hold x 10  Upper trap stretch 15 sec x 5 Rt Verbal review  of stretching.   Manual Prone lying, STM c percussive device to bilateral upper trap, Rt infraspinatus, levator scap Prone G3 cPA T3-T8   Trigger Point Dry Needling Subsequent Treatment: Instructions provided previously at initial dry needling treatment.  Patient Verbal Consent Given: Yes Education Handout Provided: Previously Provided Muscles treated: Rt upper trap  Treatment response/outcome: local twitch response  TODAY'S TREATMENT:                                                                                                       DATE: 04/07/2024 Neuro Re-ed (to improve postural recruitment/activation/awareness) Prone scapular retraction with GH ext bilateral 5 sec hold x 10  Prone scapular retraction c horizontal abduction 5 sec hold x 10  Supine cervical retraction isometric hold 5 sec x 10  Standing  green band rows c scapular retraction focus 2 x 10  Standing green band GH ext bilateral 2 x 10   Therex: UBE fwd/back 2.5 mins each way lvl 2.0 with 1 min rest between directions  Standing tband green ER c towel under arm 2 x 10 , bilaterally  Seated upper trap stretch 15 sec x 3 bilaterally   Manual Prone lying, STM c percussive device to bilateral upper trap, Rt infraspinatus, levator    Trigger Point Dry Needling Subsequent Treatment: Instructions provided previously at initial dry needling treatment.  Patient Verbal Consent Given: Yes Education Handout Provided: Previously Provided Muscles treated: Rt upper trap  Treatment response/outcome: local twitch response   PATIENT EDUCATION:  03/20/2024 Education details: HEP, POC DN (previously provided Person educated: Patient Education method: Explanation, Demonstration, Verbal cues, and Handouts Education comprehension: verbalized understanding, returned demonstration, and verbal cues required  HOME EXERCISE PROGRAM: Access Code: [email or web address] URL: [email or web address] Date: 03/20/2024 Prepared by:  Bonna Bustard  Exercises - Seated Upper Trapezius Stretch  - 2 x daily - 7 x weekly - 1 sets - 5 reps - 15 hold - Gentle Levator Scapulae Stretch (Mirrored)  - 2 x daily - 7 x weekly - 1 sets - 5 reps - 15 hold - Standing Shoulder Posterior Capsule Stretch (Mirrored)  - 2 x daily - 7 x weekly - 1 sets - 5 reps - 15 hold - Standing Isometric Shoulder External Rotation with Doorway (Mirrored)  - 1-2 x daily - 7 x weekly - 1 sets - 10 reps - 5 hold - Standing Isometric Shoulder Abduction with Doorway - Arm Bent (Mirrored)  - 1-2 x daily - 7 x weekly - 1 sets - 10 reps - 5-10 hold - Standing Bilateral Low Shoulder Row with Anchored Resistance  - 1-2 x daily - 7 x weekly - 2-3 sets - 10-15 reps - Shoulder Extension with Resistance  - 1-2 x daily - 7 x weekly - 1-2 sets - 10-15 reps - Prone Scapular Retraction  - 1 x daily - 7 x weekly - 1 sets - 10 reps - 5 hold - Prone Scapular Slide with Shoulder Extension  - 1 x daily - 7 x weekly - 1 sets - 10 reps - 5 hold - Prone Scapular Retraction Arms at Side  - 1 x daily - 7 x weekly - 1 sets - 10 reps - 5 hold  ASSESSMENT:  CLINICAL IMPRESSION: Areas of trigger point tightness noted in Rt and Lt upper trap, Rt infraspinatus and Rt levator.  Overall reduced amount of severity of symptoms noted in last few visits.  Pt continued to show good overall knowledge performance on existing HEP.    OBJECTIVE IMPAIRMENTS: decreased activity tolerance, decreased coordination, decreased endurance, decreased mobility, decreased ROM, decreased strength, increased fascial restrictions, impaired perceived functional ability, increased muscle spasms, impaired flexibility, impaired UE functional use, improper body mechanics, postural dysfunction, and pain.   ACTIVITY LIMITATIONS: carrying, lifting, bending, sitting, sleeping, reach over head, and hygiene/grooming  PARTICIPATION LIMITATIONS: meal prep, cleaning, laundry, interpersonal relationship, driving, shopping,  community activity, and house reno  PERSONAL FACTORS: Time since onset of injury/illness/exacerbation and Arthritis, history of Rt breast cancer, DM, osteopenia, raynauds disease, history of ED visits related to migraines are also affecting patient's functional outcome.   REHAB POTENTIAL: Good  CLINICAL DECISION MAKING: Evolving/moderate complexity  EVALUATION COMPLEXITY: Moderate   GOALS: Goals reviewed with patient? Yes  SHORT TERM GOALS: (target date for Short term goals are 3 weeks 04/10/2024)  1.Patient will demonstrate independent use of home exercise program to maintain progress from in clinic treatments. Goal status: Met  LONG TERM GOALS: (target dates for all long term goals are 10 weeks  05/29/2024 )   1. Patient will demonstrate/report pain at worst less than or equal to 2/10 to facilitate minimal limitation in daily activity secondary to pain symptoms. Goal status: on going 04/07/2024   2. Patient will demonstrate independent use of home exercise program to facilitate ability to maintain/progress functional gains from skilled physical therapy services. Goal status: on going 04/07/2024   3. Patient will demonstrate Patient specific functional scale avg > or = 8/10 to indicate reduced disability due to condition.  Goal status: on going 04/07/2024   4.  Patient will demonstrate cervical AROM WFL s symptoms to facilitate usual head movements for daily activity including driving, self care.   Goal status: on going 04/07/2024   5.  Patient will demonstrate bilateral shoulder MMT 5/5 throughout s symptoms to facilitate usual lifting around house at [facility].   Goal status: on going 04/07/2024   6.  Patient will demonstrate ability to sleep s restriction for symptoms.  Goal status: on going 04/07/2024     PLAN:  PT FREQUENCY: 1-2x/week  PT DURATION: 10 weeks  Can include 16109- PT Re-evaluation, 97110-Therapeutic exercises, 97530- Therapeutic activity, 97112- Neuromuscular  re-education, 97535- Self Care, 97140- Manual therapy, [phone]- Gait training, [phone]- Orthotic Fit/training, [phone]- Canalith repositioning, [id number]- Aquatic Therapy, [phone] Electrical stimulation (unattended), [phone]- Electrical stimulation (manual), [id number]- Vasopneumatic device, [id number]- Ultrasound, [id number] Physical performance testing, [id number]- Traction (mechanical), [id number]- Ionotophoresis 4mg /ml Dexamethasone , Patient/Family education, Balance training, Stair training, Taping, Dry Needling, Joint mobilization, Joint manipulation, Spinal manipulation, Spinal mobilization, Scar mobilization, Vestibular training, Visual/preceptual remediation/compensation, DME instructions, Cryotherapy, and Moist heat.  All performed as medically necessary.  All included unless contraindicated  PLAN FOR NEXT SESSION:  Needle?  Bonna Bustard, PT, DPT, OCS, ATC 04/10/24  4:38 PM

## 2024-04-12 DIAGNOSIS — G43711 Chronic migraine without aura, intractable, with status migrainosus: Secondary | ICD-10-CM | POA: Diagnosis not present

## 2024-04-14 ENCOUNTER — Encounter: Admitting: Rehabilitative and Restorative Service Providers"

## 2024-04-18 ENCOUNTER — Encounter: Admitting: Physical Therapy

## 2024-04-20 ENCOUNTER — Telehealth: Payer: Self-pay | Admitting: Physical Therapy

## 2024-04-20 ENCOUNTER — Encounter: Admitting: Physical Therapy

## 2024-04-20 NOTE — Therapy (Incomplete)
 OUTPATIENT PHYSICAL THERAPY TREATMENT   Patient Name: Jennifer [name] MRN: [id number] DOB:1955/12/06, 68 y.o., female Today's Date: 04/20/2024  END OF SESSION:       Past Medical History:  Diagnosis Date   Allergic rhinitis    Allergy    seasonal   Anemia    Anxiety    Arthritis    hands, knees   Asthma    "as a child"   Blood transfusion without reported diagnosis    "as a child"   Breast cancer (HCC) 2019   Right Breast Cancer   Cancer (HCC) 09/2018   Breast CA new DX, right breast   Cervical dysplasia    Common bile duct dilation    Diabetes mellitus without complication (HCC)    Endometriosis    Esophageal reflux    Family history of adverse reaction to anesthesia    younger sister anxiety for a dew days after   Family history of breast cancer    Family history of breast cancer    Family history of prostate cancer    Hepatic cyst 08/2020   multiple   Hepatic hemangioma 08/2020   intrahepatic hemangioma   Hiatal hernia    HSV-1 (herpes simplex virus 1) infection    Hyperlipidemia    Hypertension    pt.denies 10/19/22   Hypothyroid    Migraine with aura, without mention of intractable migraine without mention of status migrainosus    Osteopenia 08/2019   T score -2.2 FRAX 11% / 1.7%   Osteoporosis 05/07/2008   Qualifier: Diagnosis of  By: Malissa Se MD, Dannette Dutch    Personal history of radiation therapy 2019   Right Breast Cancer   Pre-diabetes    Raynaud's disease    Past Surgical History:  Procedure Laterality Date   ABDOMINAL HYSTERECTOMY     BIOPSY  09/12/2020   Procedure: BIOPSY;  Surgeon: Janel Medford, MD;  Location: [facility];  Service: Endoscopy;;   BREAST BIOPSY Left 11/27/2022   MM LT BREAST BX W LOC DEV 1ST LESION IMAGE BX SPEC STEREO GUIDE 11/27/2022 [facility]   BREAST EXCISIONAL BIOPSY Bilateral over 10 years ago   benign x 2    BREAST LUMPECTOMY Right 10/10/2018   BREAST LUMPECTOMY WITH RADIOACTIVE SEED AND SENTINEL LYMPH  NODE BIOPSY Right 10/10/2018   Procedure: RIGHT BREAST LUMPECTOMY WITH RADIOACTIVE SEED AND RIGHT SENTINEL LYMPH NODE BIOPSY;  Surgeon: Lillette Reid III, MD;  Location: [facility];  Service: General;  Laterality: Right;   BREAST SURGERY     Benign breast lump excised   CERVICAL CONE BIOPSY  1985   severe dysplasia   COLONOSCOPY  2005 and dec 2019   normal   COLPOSCOPY     endoscopy  10/2018   ESOPHAGOGASTRODUODENOSCOPY (EGD) WITH PROPOFOL  N/A 09/12/2020   Procedure: ESOPHAGOGASTRODUODENOSCOPY (EGD) WITH PROPOFOL ;  Surgeon: Janel Medford, MD;  Location: [facility];  Service: Endoscopy;  Laterality: N/A;   EUS N/A 09/12/2020   Procedure: UPPER ENDOSCOPIC ULTRASOUND (EUS) RADIAL;  Surgeon: Janel Medford, MD;  Location: [facility];  Service: Endoscopy;  Laterality: N/A;   LAPAROSCOPIC ASSISTED VAGINAL HYSTERECTOMY     LAPAROSCOPIC BILATERAL SALPINGO OOPHERECTOMY Bilateral 10/27/2019   Procedure: LAPAROSCOPIC BILATERAL SALPINGO OOPHORECTOMY WITH PERITONEAL WASHINGS;  Surgeon: Lavoie, Marie-Lyne, MD;  Location: [facility];  Service: Gynecology;  Laterality: Bilateral;  request 1:00pm on Friday, Dec. 4th in [place] Iqueue time held requests one hour OR time   moles removed from upper  back, face lft, 1 between breasts, upper leg left inside  10/18/2019   wearing small round bandaids on for 2 weeks   ROTATOR CUFF REPAIR Bilateral 08/2008   UPPER GASTROINTESTINAL ENDOSCOPY     Patient Active Problem List   Diagnosis Date Noted   Cerumen impaction 11/10/2023   Current use of proton pump inhibitor 09/12/2023   Chronic migraine without aura, with intractable migraine, so stated, with status migrainosus 07/29/2023   Migraine without aura and without status migrainosus, not intractable 07/29/2023   Thoracic back pain 01/21/2023   Pain in right shoulder 08/12/2022   Raynaud's disease 03/17/2022   Iron  deficiency anemia 09/08/2020   Common bile duct  dilatation 09/06/2020   History of breast cancer 03/01/2020   Genetic testing 11/14/2019   Family history of prostate cancer    Family history of breast cancer    Malignant neoplasm of upper-inner quadrant of right breast in female, estrogen receptor positive (HCC) 09/20/2018   B12 deficiency 04/13/2018   Joint pain 04/11/2018   Colon cancer screening 09/16/2017   Prediabetes 07/28/2016   Allergic rhinitis 11/13/2014   Hyperlipidemia, mild 09/26/2014   Routine general medical examination at a health care facility 10/28/2012   GERD 09/16/2010   Hypothyroidism 05/07/2008   Osteoporosis 05/07/2008   Migraine with aura 11/11/2007    PCP: Clemens Curt MD  REFERRING PROVIDER: Shauna Del, DO  REFERRING DIAG: [phone] (ICD-10-CM) - Chronic right shoulder pain M12.811 (ICD-10-CM) - Rotator cuff arthropathy of right shoulder M62.830 (ICD-10-CM) - Spasm of right trapezius muscle  THERAPY DIAG:  No diagnosis found.  Rationale for Evaluation and Treatment: Rehabilitation  ONSET DATE: Acute on Chronic, Feb 2025 worsening.   SUBJECTIVE:                                                                                                                                                                                                         SUBJECTIVE STATEMENT: *** Pt indicated better overall compared to Friday but still noting soreness.   PERTINENT HISTORY:  Arthritis, history of Rt breast cancer, DM, osteopenia, raynauds disease, history of ED visits related to migraines  PAIN:  NPRS scale:: 2/10 Pain location: Rt shoulder Pain description: constant, sharp, tightness at times.  Aggravating factors: Rt arm movement, lifting, head movements at times.  Relieving factors: injection  PRECAUTIONS: None  RED FLAGS: None  WEIGHT BEARING RESTRICTIONS: No  FALLS:  Has patient fallen in last 6 months? 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    OBJECTIVE:   PATIENT SURVEYS: Patient-Specific Activity Scoring Scheme  "0" represents "unable to perform." "10" represents "able to perform at prior level. 0 1 2 3 4 5 6 7 8 9  10 (Date and Score)   Activity Eval  03/20/2024    1. Reaching overhead 7     2. Lifting household items  6    3. Sleeping 5   Score 6 avg    Total score = sum of the activity scores/number of activities Minimum detectable change (90%CI) for average score = 2 points Minimum detectable change (90%CI) for single activity score = 3 points   COGNITION: 03/20/2024 Overall cognitive status: Within functional limits for tasks assessed  SENSATION: 03/20/2024 [facility]  POSTURE:  03/20/2024 rounded shoulders, forward head, and increased thoracic kyphosis  PALPATION: 03/20/2024 Trigger points, palpation tenderness with concordant symptoms Rt upper trap, Rt levator scap   CERVICAL ROM:   ROM AROM (deg) Eval 03/20/2024 AROM 04/07/2024  Flexion 57 68  Extension 50 c Rt neck pulling  60  Right lateral flexion    Left lateral flexion    Right rotation 72 c Rt neck pain 70  Left rotation 68 70   (Blank rows = not tested)  UPPER EXTREMITY ROM:   ROM Right Eval 03/20/2024 Left Eval 03/20/2024  Shoulder flexion [facility]  Shoulder extension    Shoulder abduction    Shoulder adduction    Shoulder extension    Shoulder internal rotation [facility]   Shoulder external rotation WFL    (Blank rows = not tested)  UPPER EXTREMITY MMT:  MMT Right Eval 03/20/2024 Left Eval 03/20/2024 Right 04/07/2024   Shoulder flexion 4+/5 5/5 5/5  Shoulder extension     Shoulder abduction 3+/5 c pain 4/5 4/5  Shoulder adduction     Shoulder extension     Shoulder internal rotation 5/5 5/5   Shoulder external rotation 4/5 5/5   Middle trapezius     Lower trapezius      Elbow flexion 5/5 5/5   Elbow extension 5/5 5/5    (Blank rows = not tested)  SPECIAL TESTS:  03/20/2024 No cervical testing indicated. (-) drop arm on Rt, (+) painful arc in lowering on Rt shoulder elevation  FUNCTIONAL TESTS:  03/20/2024 No specific testing today                                                                                                                                                                                 TODAY'S TREATMENT 04/20/24 ***    04/10/2024 Neuro Re-ed (to improve postural  recruitment/activation/awareness) Prone scapular retraction with GH ext bilateral 5 sec hold x 10  Seated cervical retraction into ball at wall 5 sec hold x 10  Seated scapular retraction c bilateral UE ER 2-3 sec hold x 10 , with green band x 15 total    Therex: UBE fwd/back 3 mins each way lvl 3.0 with 1 min rest between directions  Seated thoracic extension over chair with head in neutral 2-3 sec hold x 10  Upper trap stretch 15 sec x 5 Rt Verbal review of stretching.   Manual Prone lying, STM c percussive device to bilateral upper trap, Rt infraspinatus, levator scap Prone G3 cPA T3-T8   Trigger Point Dry Needling Subsequent Treatment: Instructions provided previously at initial dry needling treatment.  Patient Verbal Consent Given: Yes Education Handout Provided: Previously Provided Muscles treated: Rt upper trap  Treatment response/outcome: local twitch response   04/07/2024 Neuro Re-ed (to improve postural recruitment/activation/awareness) Prone scapular retraction with GH ext bilateral 5 sec hold x 10  Prone scapular retraction c horizontal abduction 5 sec hold x 10  Supine cervical retraction isometric hold 5 sec x 10  Standing green band rows c scapular retraction focus 2 x 10  Standing green band GH ext bilateral 2 x 10   Therex: UBE fwd/back 2.5 mins each way lvl 2.0 with 1 min rest between directions  Standing tband green ER c towel under  arm 2 x 10 , bilaterally  Seated upper trap stretch 15 sec x 3 bilaterally   Manual Prone lying, STM c percussive device to bilateral upper trap, Rt infraspinatus, levator    Trigger Point Dry Needling Subsequent Treatment: Instructions provided previously at initial dry needling treatment.  Patient Verbal Consent Given: Yes Education Handout Provided: Previously Provided Muscles treated: Rt upper trap  Treatment response/outcome: local twitch response   PATIENT EDUCATION:  03/20/2024 Education details: HEP, POC DN (previously provided Person educated: Patient Education method: Explanation, Demonstration, Verbal cues, and Handouts Education comprehension: verbalized understanding, returned demonstration, and verbal cues required  HOME EXERCISE PROGRAM: Access Code: [email or web address] URL: [email or web address] Date: 03/20/2024 Prepared by: Bonna Bustard  Exercises - Seated Upper Trapezius Stretch  - 2 x daily - 7 x weekly - 1 sets - 5 reps - 15 hold - Gentle Levator Scapulae Stretch (Mirrored)  - 2 x daily - 7 x weekly - 1 sets - 5 reps - 15 hold - Standing Shoulder Posterior Capsule Stretch (Mirrored)  - 2 x daily - 7 x weekly - 1 sets - 5 reps - 15 hold - Standing Isometric Shoulder External Rotation with Doorway (Mirrored)  - 1-2 x daily - 7 x weekly - 1 sets - 10 reps - 5 hold - Standing Isometric Shoulder Abduction with Doorway - Arm Bent (Mirrored)  - 1-2 x daily - 7 x weekly - 1 sets - 10 reps - 5-10 hold - Standing Bilateral Low Shoulder Row with Anchored Resistance  - 1-2 x daily - 7 x weekly - 2-3 sets - 10-15 reps - Shoulder Extension with Resistance  - 1-2 x daily - 7 x weekly - 1-2 sets - 10-15 reps - Prone Scapular Retraction  - 1 x daily - 7 x weekly - 1 sets - 10 reps - 5 hold - Prone Scapular Slide with Shoulder Extension  - 1 x daily - 7 x weekly - 1 sets - 10 reps - 5 hold - Prone Scapular Retraction Arms at Side  - 1 x daily - 7 x weekly -  1 sets - 10  reps - 5 hold  ASSESSMENT:  CLINICAL IMPRESSION: *** Areas of trigger point tightness noted in Rt and Lt upper trap, Rt infraspinatus and Rt levator.  Overall reduced amount of severity of symptoms noted in last few visits.  Pt continued to show good overall knowledge performance on existing HEP.    OBJECTIVE IMPAIRMENTS: decreased activity tolerance, decreased coordination, decreased endurance, decreased mobility, decreased ROM, decreased strength, increased fascial restrictions, impaired perceived functional ability, increased muscle spasms, impaired flexibility, impaired UE functional use, improper body mechanics, postural dysfunction, and pain.   ACTIVITY LIMITATIONS: carrying, lifting, bending, sitting, sleeping, reach over head, and hygiene/grooming  PARTICIPATION LIMITATIONS: meal prep, cleaning, laundry, interpersonal relationship, driving, shopping, community activity, and house reno  PERSONAL FACTORS: Time since onset of injury/illness/exacerbation and Arthritis, history of Rt breast cancer, DM, osteopenia, raynauds disease, history of ED visits related to migraines are also affecting patient's functional outcome.   REHAB POTENTIAL: Good  CLINICAL DECISION MAKING: Evolving/moderate complexity  EVALUATION COMPLEXITY: Moderate   GOALS: Goals reviewed with patient? Yes  SHORT TERM GOALS: (target date for Short term goals are 3 weeks 04/10/2024)  1.Patient will demonstrate independent use of home exercise program to maintain progress from in clinic treatments. Goal status: Met  LONG TERM GOALS: (target dates for all long term goals are 10 weeks  05/29/2024 )   1. Patient will demonstrate/report pain at worst less than or equal to 2/10 to facilitate minimal limitation in daily activity secondary to pain symptoms. Goal status: on going 04/07/2024   2. Patient will demonstrate independent use of home exercise program to facilitate ability to maintain/progress functional gains from  skilled physical therapy services. Goal status: on going 04/07/2024   3. Patient will demonstrate Patient specific functional scale avg > or = 8/10 to indicate reduced disability due to condition.  Goal status: on going 04/07/2024   4.  Patient will demonstrate cervical AROM WFL s symptoms to facilitate usual head movements for daily activity including driving, self care.   Goal status: on going 04/07/2024   5.  Patient will demonstrate bilateral shoulder MMT 5/5 throughout s symptoms to facilitate usual lifting around house at [facility].   Goal status: on going 04/07/2024   6.  Patient will demonstrate ability to sleep s restriction for symptoms.  Goal status: on going 04/07/2024     PLAN:  PT FREQUENCY: 1-2x/week  PT DURATION: 10 weeks  Can include 01027- PT Re-evaluation, 97110-Therapeutic exercises, 97530- Therapeutic activity, 97112- Neuromuscular re-education, 97535- Self Care, [id number]- Manual therapy, [phone]- Gait training, [phone]- Orthotic Fit/training, [phone]- Canalith repositioning, [id number]- Aquatic Therapy, [phone] Electrical stimulation (unattended), [phone]- Electrical stimulation (manual), [id number]- Vasopneumatic device, [id number]- Ultrasound, [id number] Physical performance testing, [id number]- Traction (mechanical), [id number]- Ionotophoresis 4mg /ml Dexamethasone , Patient/Family education, Balance training, Stair training, Taping, Dry Needling, Joint mobilization, Joint manipulation, Spinal manipulation, Spinal mobilization, Scar mobilization, Vestibular training, Visual/preceptual remediation/compensation, DME instructions, Cryotherapy, and Moist heat.  All performed as medically necessary.  All included unless contraindicated  PLAN FOR NEXT SESSION:  *** Needle?  Marley Simmers, PT, DPT 04/20/24 7:14 AM

## 2024-04-20 NOTE — Telephone Encounter (Signed)
 LVM for pt as she did not show for her PT appt.  Reminded of next scheduled appt and to call if she needs to cancel.  Marley Simmers, PT, DPT 04/20/24 9:48 AM

## 2024-04-24 ENCOUNTER — Ambulatory Visit: Admitting: Rehabilitative and Restorative Service Providers"

## 2024-04-24 ENCOUNTER — Encounter: Payer: Self-pay | Admitting: Rehabilitative and Restorative Service Providers"

## 2024-04-24 DIAGNOSIS — R293 Abnormal posture: Secondary | ICD-10-CM | POA: Diagnosis not present

## 2024-04-24 DIAGNOSIS — G8929 Other chronic pain: Secondary | ICD-10-CM | POA: Diagnosis not present

## 2024-04-24 DIAGNOSIS — M546 Pain in thoracic spine: Secondary | ICD-10-CM | POA: Diagnosis not present

## 2024-04-24 DIAGNOSIS — M542 Cervicalgia: Secondary | ICD-10-CM

## 2024-04-24 DIAGNOSIS — M25511 Pain in right shoulder: Secondary | ICD-10-CM

## 2024-04-24 DIAGNOSIS — M6281 Muscle weakness (generalized): Secondary | ICD-10-CM

## 2024-04-24 NOTE — Therapy (Signed)
 OUTPATIENT PHYSICAL THERAPY TREATMENT   Patient Name: Jennifer [name] MRN: [id number] DOB:1956-04-26, 68 y.o., female Today's Date: 04/24/2024  END OF SESSION:  PT End of Session - 04/24/24 1300     Visit Number 5    Number of Visits 20    Date for PT Re-Evaluation 05/29/24    Authorization Type [employer]    Progress Note Due on Visit 10    PT Start Time 1300    PT Stop Time 1341    PT Time Calculation (min) 41 min    Activity Tolerance Patient tolerated treatment well    Behavior During Therapy WFL for tasks assessed/performed             Past Medical History:  Diagnosis Date   Allergic rhinitis    Allergy    seasonal   Anemia    Anxiety    Arthritis    hands, knees   Asthma    "as a child"   Blood transfusion without reported diagnosis    "as a child"   Breast cancer (HCC) 2019   Right Breast Cancer   Cancer (HCC) 09/2018   Breast CA new DX, right breast   Cervical dysplasia    Common bile duct dilation    Diabetes mellitus without complication (HCC)    Endometriosis    Esophageal reflux    Family history of adverse reaction to anesthesia    younger sister anxiety for a dew days after   Family history of breast cancer    Family history of breast cancer    Family history of prostate cancer    Hepatic cyst 08/2020   multiple   Hepatic hemangioma 08/2020   intrahepatic hemangioma   Hiatal hernia    HSV-1 (herpes simplex virus 1) infection    Hyperlipidemia    Hypertension    pt.denies 10/19/22   Hypothyroid    Migraine with aura, without mention of intractable migraine without mention of status migrainosus    Osteopenia 08/2019   T score -2.2 FRAX 11% / 1.7%   Osteoporosis 05/07/2008   Qualifier: Diagnosis of  By: Malissa Se MD, Dannette Dutch    Personal history of radiation therapy 2019   Right Breast Cancer   Pre-diabetes    Raynaud's disease    Past Surgical History:  Procedure Laterality Date   ABDOMINAL HYSTERECTOMY     BIOPSY   09/12/2020   Procedure: BIOPSY;  Surgeon: Janel Medford, MD;  Location: [facility];  Service: Endoscopy;;   BREAST BIOPSY Left 11/27/2022   MM LT BREAST BX W LOC DEV 1ST LESION IMAGE BX SPEC STEREO GUIDE 11/27/2022 [facility]   BREAST EXCISIONAL BIOPSY Bilateral over 10 years ago   benign x 2    BREAST LUMPECTOMY Right 10/10/2018   BREAST LUMPECTOMY WITH RADIOACTIVE SEED AND SENTINEL LYMPH NODE BIOPSY Right 10/10/2018   Procedure: RIGHT BREAST LUMPECTOMY WITH RADIOACTIVE SEED AND RIGHT SENTINEL LYMPH NODE BIOPSY;  Surgeon: Lillette Reid III, MD;  Location: [facility];  Service: General;  Laterality: Right;   BREAST SURGERY     Benign breast lump excised   CERVICAL CONE BIOPSY  1985   severe dysplasia   COLONOSCOPY  2005 and dec 2019   normal   COLPOSCOPY     endoscopy  10/2018   ESOPHAGOGASTRODUODENOSCOPY (EGD) WITH PROPOFOL  N/A 09/12/2020   Procedure: ESOPHAGOGASTRODUODENOSCOPY (EGD) WITH PROPOFOL ;  Surgeon: Janel Medford, MD;  Location: [facility];  Service: Endoscopy;  Laterality: N/A;  EUS N/A 09/12/2020   Procedure: UPPER ENDOSCOPIC ULTRASOUND (EUS) RADIAL;  Surgeon: Janel Medford, MD;  Location: [facility];  Service: Endoscopy;  Laterality: N/A;   LAPAROSCOPIC ASSISTED VAGINAL HYSTERECTOMY     LAPAROSCOPIC BILATERAL SALPINGO OOPHERECTOMY Bilateral 10/27/2019   Procedure: LAPAROSCOPIC BILATERAL SALPINGO OOPHORECTOMY WITH PERITONEAL WASHINGS;  Surgeon: Lavoie, Marie-Lyne, MD;  Location: [facility];  Service: Gynecology;  Laterality: Bilateral;  request 1:00pm on Friday, Dec. 4th in [place] Iqueue time held requests one hour OR time   moles removed from upper back, face lft, 1 between breasts, upper leg left inside  10/18/2019   wearing small round bandaids on for 2 weeks   ROTATOR CUFF REPAIR Bilateral 08/2008   UPPER GASTROINTESTINAL ENDOSCOPY     Patient Active Problem List   Diagnosis Date Noted   Cerumen impaction  11/10/2023   Current use of proton pump inhibitor 09/12/2023   Chronic migraine without aura, with intractable migraine, so stated, with status migrainosus 07/29/2023   Migraine without aura and without status migrainosus, not intractable 07/29/2023   Thoracic back pain 01/21/2023   Pain in right shoulder 08/12/2022   Raynaud's disease 03/17/2022   Iron  deficiency anemia 09/08/2020   Common bile duct dilatation 09/06/2020   History of breast cancer 03/01/2020   Genetic testing 11/14/2019   Family history of prostate cancer    Family history of breast cancer    Malignant neoplasm of upper-inner quadrant of right breast in female, estrogen receptor positive (HCC) 09/20/2018   B12 deficiency 04/13/2018   Joint pain 04/11/2018   Colon cancer screening 09/16/2017   Prediabetes 07/28/2016   Allergic rhinitis 11/13/2014   Hyperlipidemia, mild 09/26/2014   Routine general medical examination at a health care facility 10/28/2012   GERD 09/16/2010   Hypothyroidism 05/07/2008   Osteoporosis 05/07/2008   Migraine with aura 11/11/2007    PCP: Clemens Curt MD  REFERRING PROVIDER: Shauna Del, DO  REFERRING DIAG: [phone] (ICD-10-CM) - Chronic right shoulder pain M12.811 (ICD-10-CM) - Rotator cuff arthropathy of right shoulder M62.830 (ICD-10-CM) - Spasm of right trapezius muscle  THERAPY DIAG:  Cervicalgia  Pain in thoracic spine  Chronic right shoulder pain  Abnormal posture  Muscle weakness (generalized)  Rationale for Evaluation and Treatment: Rehabilitation  ONSET DATE: Acute on Chronic, Feb 2025 worsening.   SUBJECTIVE:  SUBJECTIVE STATEMENT: Pt indicated having some lifting/carrying items in yard work that led some pain increase but was able to help with HEP to  improve symptoms.   PERTINENT HISTORY:  Arthritis, history of Rt breast cancer, DM, osteopenia, raynauds disease, history of ED visits related to migraines  PAIN:  NPRS scale:: 1/10 Pain location: Rt shoulder Pain description: constant, sharp, tightness at times.  Aggravating factors lifting Relieving factors: injection, HEP  PRECAUTIONS: None  RED FLAGS: None  WEIGHT BEARING RESTRICTIONS: No  FALLS:  Has patient fallen in last 6 months? 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    OBJECTIVE:   PATIENT SURVEYS: Patient-Specific Activity Scoring Scheme  "0" represents "unable to perform." "10" represents "able to perform at prior level. 0 1 2 3 4 5 6 7 8 9  10 (Date and Score)   Activity Eval  03/20/2024  04/24/2024  1. Reaching overhead 7   9  2. Lifting household items  6  9  3. Sleeping 5 8  4.    5.    Score 6 avg 8.667   Total score = sum of the activity scores/number of activities Minimum detectable change (90%CI) for average score = 2 points Minimum detectable change (90%CI) for single activity score = 3 points   COGNITION: 03/20/2024 Overall cognitive status: Within functional limits for tasks assessed  SENSATION: 03/20/2024 [facility]  POSTURE:  03/20/2024 rounded shoulders, forward head, and increased thoracic kyphosis  PALPATION: 03/20/2024 Trigger points, palpation tenderness with concordant symptoms Rt upper trap, Rt levator scap   CERVICAL ROM:   ROM AROM (deg) Eval 03/20/2024 AROM 04/07/2024  Flexion 57 68  Extension 50 c Rt neck pulling  60  Right lateral flexion    Left lateral flexion    Right rotation 72 c Rt neck pain 70  Left rotation 68 70   (Blank rows = not tested)  UPPER EXTREMITY ROM:   ROM Right Eval 03/20/2024 Left Eval 03/20/2024   Shoulder flexion [facility]  Shoulder extension    Shoulder abduction    Shoulder adduction    Shoulder extension    Shoulder internal rotation WFL   Shoulder external rotation [facility]   Elbow flexion    Elbow extension    Wrist flexion    Wrist extension    Wrist ulnar deviation    Wrist radial deviation    Wrist pronation    Wrist supination     (Blank rows = not tested)  UPPER EXTREMITY MMT:  MMT Right Eval 03/20/2024 Left Eval 03/20/2024 Right 04/07/2024  Right 04/24/2024  Shoulder flexion 4+/5 5/5 5/5 5/5  Shoulder extension      Shoulder abduction 3+/5 c pain 4/5 4/5 4+/5  Shoulder adduction      Shoulder extension      Shoulder internal rotation 5/5 5/5  5/5  Shoulder external rotation 4/5 5/5  4+/5  Middle trapezius      Lower trapezius      Elbow flexion 5/5 5/5    Elbow extension 5/5 5/5    Wrist flexion      Wrist extension      Wrist ulnar deviation      Wrist radial deviation      Wrist pronation      Wrist supination      Grip strength       (  Blank rows = not tested)  SPECIAL TESTS:  03/20/2024 No cervical testing indicated. (-) drop arm on Rt, (+) painful arc in lowering on Rt shoulder elevation  FUNCTIONAL TESTS:  03/20/2024 No specific testing today                                                                                                                                                                                 TODAY'S TREATMENT:                                                                                                       DATE: 04/24/2024 Neuro Re-ed (to improve postural recruitment/activation/awareness) Prone scapular retraction with GH ext bilateral 5 sec hold x 10  Prone scapular retraction c horizontal abduction lift bilateral 5 sec hold x 10   Therex: UBE fwd/back 4 mins each way lvl 2.5 with 1 min rest between directions  Green band ER with towel under arm 2 x 15 bilateral   Manual Prone lying, STM c percussive device to  bilateral upper trap, Rt infraspinatus, levator scap   Trigger Point Dry Needling Subsequent Treatment: Instructions provided previously at initial dry needling treatment.  Patient Verbal Consent Given: Yes Education Handout Provided: Previously Provided Muscles treated: Rt upper trap, Rt infraspinatus Treatment response/outcome: local twitch response  TODAY'S TREATMENT:                                                                                                       DATE: 04/10/2024 Neuro Re-ed (to improve postural recruitment/activation/awareness) Prone scapular retraction with GH ext bilateral 5 sec hold x 10  Seated cervical retraction into ball at wall 5 sec hold x 10  Seated scapular retraction c bilateral UE ER 2-3 sec hold x 10 , with green band x 15 total    Therex: UBE  fwd/back 3 mins each way lvl 3.0 with 1 min rest between directions  Seated thoracic extension over chair with head in neutral 2-3 sec hold x 10  Upper trap stretch 15 sec x 5 Rt Verbal review of stretching.   Manual Prone lying, STM c percussive device to bilateral upper trap, Rt infraspinatus, levator scap Prone G3 cPA T3-T8   Trigger Point Dry Needling Subsequent Treatment: Instructions provided previously at initial dry needling treatment.  Patient Verbal Consent Given: Yes Education Handout Provided: Previously Provided Muscles treated: Rt upper trap  Treatment response/outcome: local twitch response    PATIENT EDUCATION:  03/20/2024 Education details: HEP, POC DN (previously provided Person educated: Patient Education method: Explanation, Demonstration, Verbal cues, and Handouts Education comprehension: verbalized understanding, returned demonstration, and verbal cues required  HOME EXERCISE PROGRAM: Access Code: [email or web address] URL: [email or web address] Date: 03/20/2024 Prepared by: Bonna Bustard  Exercises - Seated Upper Trapezius Stretch  - 2 x daily - 7 x weekly - 1 sets  - 5 reps - 15 hold - Gentle Levator Scapulae Stretch (Mirrored)  - 2 x daily - 7 x weekly - 1 sets - 5 reps - 15 hold - Standing Shoulder Posterior Capsule Stretch (Mirrored)  - 2 x daily - 7 x weekly - 1 sets - 5 reps - 15 hold - Standing Isometric Shoulder External Rotation with Doorway (Mirrored)  - 1-2 x daily - 7 x weekly - 1 sets - 10 reps - 5 hold - Standing Isometric Shoulder Abduction with Doorway - Arm Bent (Mirrored)  - 1-2 x daily - 7 x weekly - 1 sets - 10 reps - 5-10 hold - Standing Bilateral Low Shoulder Row with Anchored Resistance  - 1-2 x daily - 7 x weekly - 2-3 sets - 10-15 reps - Shoulder Extension with Resistance  - 1-2 x daily - 7 x weekly - 1-2 sets - 10-15 reps - Prone Scapular Retraction  - 1 x daily - 7 x weekly - 1 sets - 10 reps - 5 hold - Prone Scapular Slide with Shoulder Extension  - 1 x daily - 7 x weekly - 1 sets - 10 reps - 5 hold - Prone Scapular Retraction Arms at Side  - 1 x daily - 7 x weekly - 1 sets - 10 reps - 5 hold  ASSESSMENT:  CLINICAL IMPRESSION: Pt reported good management of possible symptom increase after lifting/carrying items which is a good sign for home program.  Pt elected for needling to Rt infraspinatus today to help address shoulder/upper arm complaints that are noted at times with lifting/carrying activity.  Continued strengthening improvements to help improve lifting, carrying tolerance.    OBJECTIVE IMPAIRMENTS: decreased activity tolerance, decreased coordination, decreased endurance, decreased mobility, decreased ROM, decreased strength, increased fascial restrictions, impaired perceived functional ability, increased muscle spasms, impaired flexibility, impaired UE functional use, improper body mechanics, postural dysfunction, and pain.   ACTIVITY LIMITATIONS: carrying, lifting, bending, sitting, sleeping, reach over head, and hygiene/grooming  PARTICIPATION LIMITATIONS: meal prep, cleaning, laundry, interpersonal relationship,  driving, shopping, community activity, and house reno  PERSONAL FACTORS: Time since onset of injury/illness/exacerbation and Arthritis, history of Rt breast cancer, DM, osteopenia, raynauds disease, history of ED visits related to migraines are also affecting patient's functional outcome.   REHAB POTENTIAL: Good  CLINICAL DECISION MAKING: Evolving/moderate complexity  EVALUATION COMPLEXITY: Moderate   GOALS: Goals reviewed with patient? Yes  SHORT TERM GOALS: (target date for Short term goals are 3 weeks 04/10/2024)  1.Patient will demonstrate independent  use of home exercise program to maintain progress from in clinic treatments. Goal status: Met  LONG TERM GOALS: (target dates for all long term goals are 10 weeks  05/29/2024 )   1. Patient will demonstrate/report pain at worst less than or equal to 2/10 to facilitate minimal limitation in daily activity secondary to pain symptoms. Goal status: on going 04/07/2024   2. Patient will demonstrate independent use of home exercise program to facilitate ability to maintain/progress functional gains from skilled physical therapy services. Goal status: on going 04/07/2024   3. Patient will demonstrate Patient specific functional scale avg > or = 8/10 to indicate reduced disability due to condition.  Goal status: on going 04/07/2024   4.  Patient will demonstrate cervical AROM WFL s symptoms to facilitate usual head movements for daily activity including driving, self care.   Goal status: on going 04/07/2024   5.  Patient will demonstrate bilateral shoulder MMT 5/5 throughout s symptoms to facilitate usual lifting around house at [facility].   Goal status: on going 04/07/2024   6.  Patient will demonstrate ability to sleep s restriction for symptoms.  Goal status: on going 04/07/2024     PLAN:  PT FREQUENCY: 1-2x/week  PT DURATION: 10 weeks  Can include 40981- PT Re-evaluation, 97110-Therapeutic exercises, 97530- Therapeutic activity, 97112-  Neuromuscular re-education, 97535- Self Care, 97140- Manual therapy, [phone]- Gait training, [phone]- Orthotic Fit/training, [phone]- Canalith repositioning, [id number]- Aquatic Therapy, [phone] Electrical stimulation (unattended), [phone]- Electrical stimulation (manual), [id number]- Vasopneumatic device, [id number]- Ultrasound, [id number] Physical performance testing, [id number]- Traction (mechanical), [id number]- Ionotophoresis 4mg /ml Dexamethasone , Patient/Family education, Balance training, Stair training, Taping, Dry Needling, Joint mobilization, Joint manipulation, Spinal manipulation, Spinal mobilization, Scar mobilization, Vestibular training, Visual/preceptual remediation/compensation, DME instructions, Cryotherapy, and Moist heat.  All performed as medically necessary.  All included unless contraindicated  PLAN FOR NEXT SESSION:  HEP transitioning if symptoms are continued to progress better.   Bonna Bustard, PT, DPT, OCS, ATC 04/24/24  1:40 PM

## 2024-04-25 ENCOUNTER — Ambulatory Visit (INDEPENDENT_AMBULATORY_CARE_PROVIDER_SITE_OTHER): Admitting: Obstetrics and Gynecology

## 2024-04-25 ENCOUNTER — Encounter: Payer: Self-pay | Admitting: Obstetrics and Gynecology

## 2024-04-25 VITALS — BP 116/76 | HR 64 | Ht 61.0 in | Wt 126.0 lb

## 2024-04-25 DIAGNOSIS — Z79818 Long term (current) use of other agents affecting estrogen receptors and estrogen levels: Secondary | ICD-10-CM | POA: Diagnosis not present

## 2024-04-25 DIAGNOSIS — M810 Age-related osteoporosis without current pathological fracture: Secondary | ICD-10-CM

## 2024-04-25 DIAGNOSIS — Z9289 Personal history of other medical treatment: Secondary | ICD-10-CM | POA: Diagnosis not present

## 2024-04-25 DIAGNOSIS — Z853 Personal history of malignant neoplasm of breast: Secondary | ICD-10-CM

## 2024-04-25 DIAGNOSIS — Z01411 Encounter for gynecological examination (general) (routine) with abnormal findings: Secondary | ICD-10-CM

## 2024-04-25 DIAGNOSIS — Z01419 Encounter for gynecological examination (general) (routine) without abnormal findings: Secondary | ICD-10-CM

## 2024-04-25 NOTE — Progress Notes (Unsigned)
 68 y.o. G0P0000 Widowed Caucasian Hispanic female here for a breast and pelvic exam.  Wants to discuss stopping her prolia , would like to discuss other options.  She is due for another Prolia  injection in July through her PCP.     Sees Dr. Gudena for breast cancer care.  On Letrozole .   Is considering a dental implant.  The patient is also followed for remote hx of cervical dysplasia. Hx conization in remote past.   PCP: Tower, Manley Seeds, MD   No LMP recorded. Patient has had a hysterectomy? No confirmatory documentation on chart review of patholoygy reports.         Sexually active: No.  The current method of family planning is post menopausal status.    Menopausal hormone therapy:  n/a Exercising: Yes.    Walking Smoker:  no  OB History     Gravida  0   Para  0   Term  0   Preterm  0   AB  0   Living  0      SAB  0   IAB  0   Ectopic  0   Multiple  0   Live Births  0           HEALTH MAINTENANCE: Last 2 paps: 02/02/23 neg, 01/28/22 neg HR HPV neg History of abnormal Pap or positive HPV:  no Mammogram:  02/22/24 Breast Density Cat D, BIRADS Cat 1 neg  Colonoscopy:  11/18/22 Bone Density:  11/08/23  Result  osteoporotic.  T score left femur neck -2.6, spine -1.6.  Immunization History  Administered Date(s) Administered   Fluad Quad(high Dose 65+) 09/18/2022   Fluad Trivalent(High Dose 65+) 08/30/2023   Influenza Inj Mdck Quad Pf 08/11/2019   Influenza Whole 08/21/2009, 08/28/2010   Influenza, High Dose Seasonal PF 10/28/2017, 09/13/2018, 10/10/2019, 10/22/2020, 10/10/2021   Influenza,inj,Quad PF,6+ Mos 08/28/2015, 08/03/2016, 08/05/2018   Influenza-Unspecified 09/06/2017, 08/03/2020   PFIZER Comirnaty(Gray Top)Covid-19 Tri-Sucrose Vaccine 06/14/2021   PFIZER(Purple Top)SARS-COV-2 Vaccination 01/05/2020, 01/30/2020, 09/05/2020, 10/22/2020   PNEUMOCOCCAL CONJUGATE-20 11/18/2021   Pfizer Covid-19 Vaccine Bivalent Booster 57yrs & up 11/11/2021    Pneumococcal Polysaccharide-23 10/28/2017, 10/10/2019, 10/22/2020, 10/28/2020   Rsv, Bivalent, Protein Subunit Rsvpref,pf Pattricia Bores) 02/13/2024   Tdap 10/28/2012   Zoster Recombinant(Shingrix) 08/05/2018, 09/24/2018      reports that she has never smoked. She has never been exposed to tobacco smoke. She has never used smokeless tobacco. She reports that she does not drink alcohol and does not use drugs.  Past Medical History:  Diagnosis Date   Allergic rhinitis    Allergy    seasonal   Anemia    Anxiety    Arthritis    hands, knees   Asthma    "as a child"   Blood transfusion without reported diagnosis    "as a child"   Breast cancer (HCC) 2019   Right Breast Cancer   Cancer (HCC) 09/2018   Breast CA new DX, right breast   Cervical dysplasia    Common bile duct dilation    Diabetes mellitus without complication (HCC)    Endometriosis    Esophageal reflux    Family history of adverse reaction to anesthesia    younger sister anxiety for a dew days after   Family history of breast cancer    Family history of breast cancer    Family history of prostate cancer    Hepatic cyst 08/2020   multiple   Hepatic hemangioma 08/2020   intrahepatic  hemangioma   Hiatal hernia    HSV-1 (herpes simplex virus 1) infection    Hyperlipidemia    Hypertension    pt.denies 10/19/22   Hypothyroid    Migraine with aura, without mention of intractable migraine without mention of status migrainosus    Osteopenia 08/2019   T score -2.2 FRAX 11% / 1.7%   Osteoporosis 05/07/2008   Qualifier: Diagnosis of  By: Malissa Se MD, Dannette Dutch    Personal history of radiation therapy 2019   Right Breast Cancer   Pre-diabetes    Raynaud's disease     Past Surgical History:  Procedure Laterality Date   ABDOMINAL HYSTERECTOMY     BIOPSY  09/12/2020   Procedure: BIOPSY;  Surgeon: Janel Medford, MD;  Location: [facility];  Service: Endoscopy;;   BREAST BIOPSY Left 11/27/2022   MM LT BREAST BX W LOC  DEV 1ST LESION IMAGE BX SPEC STEREO GUIDE 11/27/2022 [facility]   BREAST EXCISIONAL BIOPSY Bilateral over 10 years ago   benign x 2    BREAST LUMPECTOMY Right 10/10/2018   BREAST LUMPECTOMY WITH RADIOACTIVE SEED AND SENTINEL LYMPH NODE BIOPSY Right 10/10/2018   Procedure: RIGHT BREAST LUMPECTOMY WITH RADIOACTIVE SEED AND RIGHT SENTINEL LYMPH NODE BIOPSY;  Surgeon: Lillette Reid III, MD;  Location: [facility];  Service: General;  Laterality: Right;   BREAST SURGERY     Benign breast lump excised   CERVICAL CONE BIOPSY  1985   severe dysplasia   COLONOSCOPY  2005 and dec 2019   normal   COLPOSCOPY     endoscopy  10/2018   ESOPHAGOGASTRODUODENOSCOPY (EGD) WITH PROPOFOL  N/A 09/12/2020   Procedure: ESOPHAGOGASTRODUODENOSCOPY (EGD) WITH PROPOFOL ;  Surgeon: Janel Medford, MD;  Location: [facility];  Service: Endoscopy;  Laterality: N/A;   EUS N/A 09/12/2020   Procedure: UPPER ENDOSCOPIC ULTRASOUND (EUS) RADIAL;  Surgeon: Janel Medford, MD;  Location: [facility];  Service: Endoscopy;  Laterality: N/A;   LAPAROSCOPIC ASSISTED VAGINAL HYSTERECTOMY     LAPAROSCOPIC BILATERAL SALPINGO OOPHERECTOMY Bilateral 10/27/2019   Procedure: LAPAROSCOPIC BILATERAL SALPINGO OOPHORECTOMY WITH PERITONEAL WASHINGS;  Surgeon: Lavoie, Marie-Lyne, MD;  Location: [facility];  Service: Gynecology;  Laterality: Bilateral;  request 1:00pm on Friday, Dec. 4th in [place] time held requests one hour OR time   moles removed from upper back, face lft, 1 between breasts, upper leg left inside  10/18/2019   wearing small round bandaids on for 2 weeks   ROTATOR CUFF REPAIR Bilateral 08/2008   UPPER GASTROINTESTINAL ENDOSCOPY      Current Outpatient Medications  Medication Sig Dispense Refill   ALPRAZolam  (XANAX ) 0.5 MG tablet TAKE 1/2 TO 1 TABLET BY MOUTH ONCE DAILY AS NEEDED FOR SLEEP/ANXIETY 15 tablet 2   atenolol  (TENORMIN ) 25 MG tablet Take 12.5 mg by mouth 3  (three) times daily. For migraine prevention     B Complex Vitamins (B COMPLEX PO) Take by mouth.     baclofen  (LIORESAL ) 10 MG tablet Take 1 tablet (10 mg total) by mouth 3 (three) times daily. 30 each 0   CALCIUM  CARBONATE-VITAMIN D  PO Take 1 tablet by mouth daily. 1000 mg     denosumab  (PROLIA ) 60 MG/ML SOSY injection Inject 60 mg into the skin every 6 (six) months.     Eptinezumab -jjmr (VYEPTI  IV) Inject into the vein every 3 (three) months.     famotidine  (PEPCID ) 40 MG tablet Take 40 mg by mouth 2 (two) times daily.  Fremanezumab -vfrm (AJOVY ) 225 MG/1.5ML SOAJ Inject 225 mg into the skin every 30 (thirty) days. 1.5 mL 11   Fremanezumab -vfrm (AJOVY ) 225 MG/1.5ML SOAJ Inject 225mg  into skin every 30 days.  2 samples of AJOVY  NDC 1610960454, LOT [id number]  exp 11/27. 1.5 mL 1   hydrocortisone  2.5 % cream Apply to affected areas BID PRN as directed. 30 g 3   hydrOXYzine  (ATARAX ) 25 MG tablet Take 37.5 mg by mouth at bedtime as needed.     ketorolac  (TORADOL ) 10 MG tablet Take by mouth.     letrozole  (FEMARA ) 2.5 MG tablet Take 1 tablet (2.5 mg total) by mouth daily. 90 tablet 3   levocetirizine (XYZAL) 5 MG tablet Take 5 mg by mouth at bedtime.   3   levothyroxine  (SYNTHROID ) 75 MCG tablet TAKE 1 TABLET BY MOUTH EVERY DAY BEFORE BREAKFAST 90 tablet 2   magnesium  oxide (MAG-OX) 400 MG tablet Take by mouth.     Magnesium  Oxide 400 (240 Mg) MG TABS Take 400 mg by mouth daily.      metFORMIN  (GLUCOPHAGE -XR) 500 MG 24 hr tablet Take 1 tablet (500 mg total) by mouth daily with breakfast. 90 tablet 3   Multiple Vitamin (MULTIVITAMIN WITH MINERALS) TABS tablet Take 1 tablet by mouth daily.     nabumetone  (RELAFEN ) 500 MG tablet Take 500 mg by mouth 2 (two) times daily.     naratriptan  (AMERGE) 2.5 MG tablet TAKE 1 TABLET (2.5 MG TOTAL) BY MOUTH AS NEEDED FOR MIGRAINE. TAKE ONE (1) TABLET AT ONSET OF HEADACHE IF RETURNS OR DOES NOT RESOLVE, MAY REPEAT AFTER 4 HOURS DO NOT EXCEED FIVE (5) MG IN 24  HOURS. 9 tablet 1   neomycin-polymyxin b-dexamethasone  (MAXITROL) 3.5-10000-0.1 SUSP 1 drop 3 (three) times daily.     Omega-3 Fatty Acids (FISH OIL) 1000 MG CAPS Take 1,000 mg by mouth daily.      OnabotulinumtoxinA  (BOTOX  IJ) Inject 1 Dose as directed every 6 (six) weeks.      ondansetron  (ZOFRAN  ODT) 4 MG disintegrating tablet Take 1 tablet (4 mg total) by mouth every 8 (eight) hours as needed. 20 tablet 0   pantoprazole  (PROTONIX ) 20 MG tablet TAKE 1 TABLET BY MOUTH EVERY DAY 90 tablet 1   PRESCRIPTION MEDICATION Inject into the skin every 6 (six) weeks. Steroid Trigger     Probiotic Product (PROBIOTIC-10 PO) Take 1 capsule by mouth daily.      promethazine  (PHENERGAN ) 25 MG tablet Take 25 mg by mouth daily as needed.     rizatriptan  (MAXALT ) 10 MG tablet Take 1 tablet (10 mg total) by mouth once as needed for up to 10 days for migraine. May repeat in 2 hours if needed 10 tablet 0   triamcinolone  cream (KENALOG ) 0.1 % Apply to affected areas BID PRN for up to two weeks at a time as directed. 30 g 1   TURMERIC PO Take 1 tablet by mouth daily.     Zavegepant HCl (ZAVZPRET ) 10 MG/ACT SOLN Place 10 mg into the nose daily as needed (onset migraine). NDC 47829562-13, LOT [id number]  exp 11/2024 1 each 1   No current facility-administered medications for this visit.    ALLERGIES: Ciprofloxacin, Codeine phosphate, Decongestant [pseudoephedrine], Diclofenac, Diphenhydramine hcl (sleep), Flonase  [fluticasone  propionate], Fluticasone , Hydrocodone , Macrobid  [nitrofurantoin ], Nasal spray, Nasonex  [mometasone ], Reglan [metoclopramide], Topamax [topiramate], Benadryl [diphenhydramine hcl], and Sulfa antibiotics  Family History  Problem Relation Age of Onset   Breast cancer Mother 14   Diabetes Mother    Irritable bowel  syndrome Mother    Cancer Mother    Hearing loss Mother    Hyperlipidemia Mother    Miscarriages / Stillbirths Mother    Hypertension Father    Heart disease Father    Hypertension  Sister    Anuerysm Sister        head   Depression Sister    Irritable bowel syndrome Sister    Hypertension Sister    Anxiety disorder Sister    Other Sister        prediabetes   Celiac disease Brother    Diabetes Brother    Osteoporosis Maternal Grandmother    Diabetes Maternal Grandmother    Arthritis Maternal Grandmother    COPD Paternal Grandmother    Heart disease Paternal Grandfather    Breast cancer Cousin 21       pat first cousin   Breast cancer Cousin        mat second cousin with breast cancer in her 30s   Thyroid  cancer Cousin 53       pat first cousin   Prostate cancer Other 33   Colon cancer Neg Hx    Stomach cancer Neg Hx    Esophageal cancer Neg Hx    Pancreatic cancer Neg Hx    Rectal cancer Neg Hx    Colon polyps Neg Hx    Crohn's disease Neg Hx    Ulcerative colitis Neg Hx     Review of Systems  PHYSICAL EXAM:  There were no vitals taken for this visit.    General appearance: alert, cooperative and appears stated age Head: normocephalic, without obvious abnormality, atraumatic Neck: no adenopathy, supple, symmetrical, trachea midline and thyroid  normal to inspection and palpation Lungs: clear to auscultation bilaterally Breasts: normal appearance, no masses or tenderness, No nipple retraction or dimpling, No nipple discharge or bleeding, No axillary adenopathy Heart: regular rate and rhythm Abdomen: soft, non-tender; no masses, no organomegaly Extremities: extremities normal, atraumatic, no cyanosis or edema Skin: skin color, texture, turgor normal. No rashes or lesions Lymph nodes: cervical, supraclavicular, and axillary nodes normal. Neurologic: grossly normal  Pelvic: External genitalia:  no lesions              No abnormal inguinal nodes palpated.              Urethra:  normal appearing urethra with no masses, tenderness or lesions              Bartholins and Skenes: normal                 Vagina: normal appearing vagina with normal color  and discharge, no lesions              Cervix: no lesions              Pap taken: no Bimanual Exam:  Uterus:  normal size, contour, position, consistency, mobility, non-tender              Adnexa: no mass, fullness, tenderness              Rectal exam: yes.  Confirms.              Anus:  normal sphincter tone, no lesions  Chaperone was present for exam:  Sophia Dustman, CMA  ASSESSMENT: Encounter for breast and pelvic exam.  Personal history of other medical treatment.  Hx breast cancer. Status post TAH on chart review?   Cervix is definitely present today.   Status post  laparoscopic BSO 10/27/2019. Serous cystadenoma of left ovary on chart review. Hx cervical dysplasia in remote past.  Osteoporosis. No current fracture.  On Prolia .   PLAN: Mammogram screening discussed. Self breast awareness reviewed. Pap and HRV collected:  no.  Guidelines for Calcium , Vitamin D , regular exercise program including cardiovascular and weight bearing exercise. Medication refills:  NA Labs with PCP.  Follow up:  ***    Additional counseling given.  {yes [id number]. ***  total time was spent for this patient encounter, including preparation, face-to-face counseling with the patient, coordination of care, and documentation of the encounter in addition to doing the breast and pelvic exam.

## 2024-04-25 NOTE — Progress Notes (Unsigned)
 68 y.o. G0P0000 Widowed {RACE/ETHNICITY:22866} female here for a breast and pelvic exam.    The patient is also followed for ***.  PCP: Clemens Curt, MD   No LMP recorded. Patient has had a hysterectomy.           Sexually active: No.  The current method of family planning is post menopausal status.    Menopausal hormone therapy:  n/a Exercising: Yes.    walking Smoker:  no  OB History     Gravida  0   Para  0   Term  0   Preterm  0   AB  0   Living  0      SAB  0   IAB  0   Ectopic  0   Multiple  0   Live Births  0           HEALTH MAINTENANCE: Last 2 paps: 02/02/23 neg, 01/28/22 neg HR HPV neg History of abnormal Pap or positive HPV:  no Mammogram:  02/22/24 Breast Density Cat D, BIRADS Cat 1 neg  Colonoscopy:  11/18/22 Bone Density:  11/08/23  Result  osteoporotic     Immunization History  Administered Date(s) Administered   Fluad Quad(high Dose 65+) 09/18/2022   Fluad Trivalent(High Dose 65+) 08/30/2023   Influenza Inj Mdck Quad Pf 08/11/2019   Influenza Whole 08/21/2009, 08/28/2010   Influenza, High Dose Seasonal PF 10/28/2017, 09/13/2018, 10/10/2019, 10/22/2020, 10/10/2021   Influenza,inj,Quad PF,6+ Mos 08/28/2015, 08/03/2016, 08/05/2018   Influenza-Unspecified 09/06/2017, 08/03/2020   PFIZER Comirnaty(Gray Top)Covid-19 Tri-Sucrose Vaccine 06/14/2021   PFIZER(Purple Top)SARS-COV-2 Vaccination 01/05/2020, 01/30/2020, 09/05/2020, 10/22/2020   PNEUMOCOCCAL CONJUGATE-20 11/18/2021   Pfizer Covid-19 Vaccine Bivalent Booster 35yrs & up 11/11/2021   Pneumococcal Polysaccharide-23 10/28/2017, 10/10/2019, 10/22/2020, 10/28/2020   Rsv, Bivalent, Protein Subunit Rsvpref,pf Jennifer [name]) 02/13/2024   Tdap 10/28/2012   Zoster Recombinant(Shingrix) 08/05/2018, 09/24/2018      reports that she has never smoked. She has never been exposed to tobacco smoke. She has never used smokeless tobacco. She reports that she does not drink alcohol and does not use  drugs.  Past Medical History:  Diagnosis Date   Allergic rhinitis    Allergy    seasonal   Anemia    Anxiety    Arthritis    hands, knees   Asthma    "as a child"   Blood transfusion without reported diagnosis    "as a child"   Breast cancer (HCC) 2019   Right Breast Cancer   Cancer (HCC) 09/2018   Breast CA new DX, right breast   Cervical dysplasia    Common bile duct dilation    Diabetes mellitus without complication (HCC)    Endometriosis    Esophageal reflux    Family history of adverse reaction to anesthesia    younger sister anxiety for a dew days after   Family history of breast cancer    Family history of breast cancer    Family history of prostate cancer    Hepatic cyst 08/2020   multiple   Hepatic hemangioma 08/2020   intrahepatic hemangioma   Hiatal hernia    HSV-1 (herpes simplex virus 1) infection    Hyperlipidemia    Hypertension    pt.denies 10/19/22   Hypothyroid    Migraine with aura, without mention of intractable migraine without mention of status migrainosus    Osteopenia 08/2019   T score -2.2 FRAX 11% / 1.7%   Osteoporosis 05/07/2008   Qualifier: Diagnosis of  ByMalissa Se MD, Dannette Dutch    Personal history of radiation therapy 2019   Right Breast Cancer   Pre-diabetes    Raynaud's disease     Past Surgical History:  Procedure Laterality Date   ABDOMINAL HYSTERECTOMY     BIOPSY  09/12/2020   Procedure: BIOPSY;  Surgeon: Janel Medford, MD;  Location: [facility];  Service: Endoscopy;;   BREAST BIOPSY Left 11/27/2022   MM LT BREAST BX W LOC DEV 1ST LESION IMAGE BX SPEC STEREO GUIDE 11/27/2022 [facility]   BREAST EXCISIONAL BIOPSY Bilateral over 10 years ago   benign x 2    BREAST LUMPECTOMY Right 10/10/2018   BREAST LUMPECTOMY WITH RADIOACTIVE SEED AND SENTINEL LYMPH NODE BIOPSY Right 10/10/2018   Procedure: RIGHT BREAST LUMPECTOMY WITH RADIOACTIVE SEED AND RIGHT SENTINEL LYMPH NODE BIOPSY;  Surgeon: Caralyn Chandler, MD;   Location: [facility];  Service: General;  Laterality: Right;   BREAST SURGERY     Benign breast lump excised   CERVICAL CONE BIOPSY  1985   severe dysplasia   COLONOSCOPY  2005 and dec 2019   normal   COLPOSCOPY     endoscopy  10/2018   ESOPHAGOGASTRODUODENOSCOPY (EGD) WITH PROPOFOL  N/A 09/12/2020   Procedure: ESOPHAGOGASTRODUODENOSCOPY (EGD) WITH PROPOFOL ;  Surgeon: Janel Medford, MD;  Location: [facility];  Service: Endoscopy;  Laterality: N/A;   EUS N/A 09/12/2020   Procedure: UPPER ENDOSCOPIC ULTRASOUND (EUS) RADIAL;  Surgeon: Janel Medford, MD;  Location: [facility];  Service: Endoscopy;  Laterality: N/A;   LAPAROSCOPIC ASSISTED VAGINAL HYSTERECTOMY     LAPAROSCOPIC BILATERAL SALPINGO OOPHERECTOMY Bilateral 10/27/2019   Procedure: LAPAROSCOPIC BILATERAL SALPINGO OOPHORECTOMY WITH PERITONEAL WASHINGS;  Surgeon: Lavoie, Marie-Lyne, MD;  Location: [facility];  Service: Gynecology;  Laterality: Bilateral;  request 1:00pm on Friday, Dec. 4th in [place] [facility] time held requests one hour OR time   moles removed from upper back, face lft, 1 between breasts, upper leg left inside  10/18/2019   wearing small round bandaids on for 2 weeks   ROTATOR CUFF REPAIR Bilateral 08/2008   UPPER GASTROINTESTINAL ENDOSCOPY      Current Outpatient Medications  Medication Sig Dispense Refill   ALPRAZolam  (XANAX ) 0.5 MG tablet TAKE 1/2 TO 1 TABLET BY MOUTH ONCE DAILY AS NEEDED FOR SLEEP/ANXIETY 15 tablet 2   atenolol  (TENORMIN ) 25 MG tablet Take 12.5 mg by mouth 3 (three) times daily. For migraine prevention     B Complex Vitamins (B COMPLEX PO) Take by mouth.     baclofen  (LIORESAL ) 10 MG tablet Take 1 tablet (10 mg total) by mouth 3 (three) times daily. 30 each 0   CALCIUM  CARBONATE-VITAMIN D  PO Take 1 tablet by mouth daily. 1000 mg     denosumab  (PROLIA ) 60 MG/ML SOSY injection Inject 60 mg into the skin every 6 (six) months.     Eptinezumab -jjmr (VYEPTI   IV) Inject into the vein every 3 (three) months.     famotidine  (PEPCID ) 40 MG tablet Take 40 mg by mouth 2 (two) times daily.      hydrocortisone  2.5 % cream Apply to affected areas BID PRN as directed. 30 g 3   hydrOXYzine  (ATARAX ) 25 MG tablet Take 37.5 mg by mouth at bedtime as needed.     ketorolac  (TORADOL ) 10 MG tablet Take by mouth.     letrozole  (FEMARA ) 2.5 MG tablet Take 1 tablet (2.5 mg total) by mouth daily. 90 tablet 3   levocetirizine (XYZAL) 5  MG tablet Take 5 mg by mouth at bedtime.   3   levothyroxine  (SYNTHROID ) 75 MCG tablet TAKE 1 TABLET BY MOUTH EVERY DAY BEFORE BREAKFAST 90 tablet 2   magnesium  oxide (MAG-OX) 400 MG tablet Take by mouth.     Magnesium  Oxide 400 (240 Mg) MG TABS Take 400 mg by mouth daily.      metFORMIN  (GLUCOPHAGE -XR) 500 MG 24 hr tablet Take 1 tablet (500 mg total) by mouth daily with breakfast. 90 tablet 3   Multiple Vitamin (MULTIVITAMIN WITH MINERALS) TABS tablet Take 1 tablet by mouth daily.     nabumetone  (RELAFEN ) 500 MG tablet Take 500 mg by mouth 2 (two) times daily.     naratriptan  (AMERGE) 2.5 MG tablet TAKE 1 TABLET (2.5 MG TOTAL) BY MOUTH AS NEEDED FOR MIGRAINE. TAKE ONE (1) TABLET AT ONSET OF HEADACHE IF RETURNS OR DOES NOT RESOLVE, MAY REPEAT AFTER 4 HOURS DO NOT EXCEED FIVE (5) MG IN 24 HOURS. 9 tablet 1   neomycin-polymyxin b-dexamethasone  (MAXITROL) 3.5-10000-0.1 SUSP 1 drop 3 (three) times daily.     Omega-3 Fatty Acids (FISH OIL) 1000 MG CAPS Take 1,000 mg by mouth daily.      OnabotulinumtoxinA  (BOTOX  IJ) Inject 1 Dose as directed every 6 (six) weeks.      ondansetron  (ZOFRAN  ODT) 4 MG disintegrating tablet Take 1 tablet (4 mg total) by mouth every 8 (eight) hours as needed. 20 tablet 0   pantoprazole  (PROTONIX ) 20 MG tablet TAKE 1 TABLET BY MOUTH EVERY DAY 90 tablet 1   PRESCRIPTION MEDICATION Inject into the skin every 6 (six) weeks. Steroid Trigger     Probiotic Product (PROBIOTIC-10 PO) Take 1 capsule by mouth daily.       promethazine  (PHENERGAN ) 25 MG tablet Take 25 mg by mouth daily as needed.     rizatriptan  (MAXALT ) 10 MG tablet Take 1 tablet (10 mg total) by mouth once as needed for up to 10 days for migraine. May repeat in 2 hours if needed 10 tablet 0   triamcinolone  cream (KENALOG ) 0.1 % Apply to affected areas BID PRN for up to two weeks at a time as directed. 30 g 1   TURMERIC PO Take 1 tablet by mouth daily.     Fremanezumab -vfrm (AJOVY ) 225 MG/1.5ML SOAJ Inject 225 mg into the skin every 30 (thirty) days. (Patient not taking: Reported on 04/25/2024) 1.5 mL 11   Fremanezumab -vfrm (AJOVY ) 225 MG/1.5ML SOAJ Inject 225mg  into skin every 30 days.  2 samples of AJOVY  NDC 1191478295, LOT [id number]  exp 11/27. (Patient not taking: Reported on 04/25/2024) 1.5 mL 1   Zavegepant HCl (ZAVZPRET ) 10 MG/ACT SOLN Place 10 mg into the nose daily as needed (onset migraine). NDC 57846962-95, LOT [id number]  exp 11/2024 (Patient not taking: Reported on 04/25/2024) 1 each 1   No current facility-administered medications for this visit.    ALLERGIES: Ciprofloxacin, Codeine phosphate, Decongestant [pseudoephedrine], Diclofenac, Diphenhydramine hcl (sleep), Flonase  [fluticasone  propionate], Fluticasone , Hydrocodone , Macrobid  [nitrofurantoin ], Nasal spray, Nasonex  [mometasone ], Reglan [metoclopramide], Topamax [topiramate], Benadryl [diphenhydramine hcl], and Sulfa antibiotics  Family History  Problem Relation Age of Onset   Breast cancer Mother 3   Diabetes Mother    Irritable bowel syndrome Mother    Cancer Mother    Hearing loss Mother    Hyperlipidemia Mother    Miscarriages / Stillbirths Mother    Hypertension Father    Heart disease Father    Hypertension Sister    Anuerysm Sister  head   Depression Sister    Irritable bowel syndrome Sister    Hypertension Sister    Anxiety disorder Sister    Other Sister        prediabetes   Celiac disease Brother    Diabetes Brother    Osteoporosis Maternal Grandmother     Diabetes Maternal Grandmother    Arthritis Maternal Grandmother    COPD Paternal Grandmother    Heart disease Paternal Grandfather    Breast cancer Cousin 16       pat first cousin   Breast cancer Cousin        mat second cousin with breast cancer in her 30s   Thyroid  cancer Cousin 77       pat first cousin   Prostate cancer Other 66   Colon cancer Neg Hx    Stomach cancer Neg Hx    Esophageal cancer Neg Hx    Pancreatic cancer Neg Hx    Rectal cancer Neg Hx    Colon polyps Neg Hx    Crohn's disease Neg Hx    Ulcerative colitis Neg Hx     Review of Systems  All other systems reviewed and are negative.   PHYSICAL EXAM:  BP 116/76 (BP Location: Left Arm, Patient Position: Sitting)   Pulse 64   Ht 5\' 1"  (1.549 m)   Wt 126 lb (57.2 kg)   SpO2 98%   BMI 23.81 kg/m     General appearance: alert, cooperative and appears stated age Head: normocephalic, without obvious abnormality, atraumatic Neck: no adenopathy, supple, symmetrical, trachea midline and thyroid  normal to inspection and palpation Lungs: clear to auscultation bilaterally Breasts: normal appearance, no masses or tenderness, No nipple retraction or dimpling, No nipple discharge or bleeding, No axillary adenopathy Heart: regular rate and rhythm Abdomen: soft, non-tender; no masses, no organomegaly Extremities: extremities normal, atraumatic, no cyanosis or edema Skin: skin color, texture, turgor normal. No rashes or lesions Lymph nodes: cervical, supraclavicular, and axillary nodes normal. Neurologic: grossly normal  Pelvic: External genitalia:  no lesions              No abnormal inguinal nodes palpated.              Urethra:  normal appearing urethra with no masses, tenderness or lesions              Bartholins and Skenes: normal                 Vagina: normal appearing vagina with normal color and discharge, no lesions              Cervix: no lesions              Pap taken: {yes no:314532} Bimanual Exam:   Uterus:  normal size, contour, position, consistency, mobility, non-tender              Adnexa: no mass, fullness, tenderness              Rectal exam: {yes no:314532}.  Confirms.              Anus:  normal sphincter tone, no lesions  Chaperone was present for exam:  {BSCHAPERONE:31226::"Emily F, CMA"}  ASSESSMENT: Encounter for breast and pelvic exam.   ***  PLAN: Mammogram screening discussed. Self breast awareness reviewed. Pap and HRV collected:  {yes no:314532} Guidelines for Calcium , Vitamin D , regular exercise program including cardiovascular and weight bearing exercise. Medication refills:  *** {LABS (Optional):23779} Follow up:  ***  Additional counseling given.  {yes [id number]. ***  total time was spent for this patient encounter, including preparation, face-to-face counseling with the patient, coordination of care, and documentation of the encounter in addition to doing the breast and pelvic exam.

## 2024-04-25 NOTE — Patient Instructions (Addendum)
 Zoledronic Acid Injection (Bone Disorders) What is this medication? ZOLEDRONIC ACID (ZOE le dron ik AS id) prevents and treats osteoporosis. It may also be used to treat Paget's disease of the bone. It works by Interior and spatial designer stronger and less likely to break (fracture). It belongs to a group of medications called bisphosphonates. This medicine may be used for other purposes; ask your health care provider or pharmacist if you have questions. COMMON BRAND NAME(S): Reclast What should I tell my care team before I take this medication? They need to know if you have any of these conditions: Bleeding disorder Cancer Dental disease Kidney disease Low levels of calcium  in the blood Low red blood cell counts Lung or breathing disease, such as asthma Receiving steroids, such as dexamethasone  or prednisone  An unusual or allergic reaction to zoledronic acid, other medications, foods, dyes, or preservatives Pregnant or trying to get pregnant Breast-feeding How should I use this medication? This medication is injected into a vein. It is given by your care team in a hospital or clinic setting. A special MedGuide will be given to you before each treatment. Be sure to read this information carefully each time. Talk to your care team about the use of this medication in children. Special care may be needed. Overdosage: If you think you have taken too much of this medicine contact a poison control center or emergency room at once. NOTE: This medicine is only for you. Do not share this medicine with others. What if I miss a dose? Keep appointments for follow-up doses. It is important not to miss your dose. Call your care team if you are unable to keep an appointment. What may interact with this medication? Certain antibiotics given by injection Medications for pain and inflammation, such as ibuprofen , naproxen, NSAIDs Some diuretics, such as bumetanide, furosemide Teriparatide This list may not  describe all possible interactions. Give your health care provider a list of all the medicines, herbs, non-prescription drugs, or dietary supplements you use. Also tell them if you smoke, drink alcohol, or use illegal drugs. Some items may interact with your medicine. What should I watch for while using this medication? Visit your care team for regular checks on your progress. It may be some time before you see the benefit from this medication. Some people who take this medication have severe bone, joint, or muscle pain. This medication may also increase your risk for jaw problems or a broken thigh bone. Tell your care team right away if you have severe pain in your jaw, bones, joints, or muscles. Tell your care team if you have any pain that does not go away or that gets worse. You should make sure you get enough calcium  and vitamin D  while you are taking this medication. Discuss the foods you eat and the vitamins you take with your care team. You may need bloodwork while taking this medication. Tell your dentist and dental surgeon that you are taking this medication. You should not have major dental surgery while on this medication. See your dentist to have a dental exam and fix any dental problems before starting this medication. Take good care of your teeth while on this medication. Make sure you see your dentist for regular follow-up appointments. What side effects may I notice from receiving this medication? Side effects that you should report to your care team as soon as possible: Allergic reactions--skin rash, itching, hives, swelling of the face, lips, tongue, or throat Kidney injury--decrease in the amount of urine,  swelling of the ankles, hands, or feet Low calcium  level--muscle pain or cramps, confusion, tingling, or numbness in the hands or feet Osteonecrosis of the jaw--pain, swelling, or redness in the mouth, numbness of the jaw, poor healing after dental work, unusual discharge from the  mouth, visible bones in the mouth Severe bone, joint, or muscle pain Side effects that usually do not require medical attention (report to your care team if they continue or are bothersome): Diarrhea Dizziness Headache Nausea Stomach pain Vomiting This list may not describe all possible side effects. Call your doctor for medical advice about side effects. You may report side effects to FDA at 1-800-FDA-1088. Where should I keep my medication? This medication is given in a hospital or clinic. It will not be stored at home. NOTE: This sheet is a summary. It may not cover all possible information. If you have questions about this medicine, talk to your doctor, pharmacist, or health care provider.  2024 Elsevier/Gold Standard (2021-12-26 00:00:00)  EXERCISE AND DIET:  We recommended that you start or continue a regular exercise program for good health. Regular exercise means any activity that makes your heart beat faster and makes you sweat.  We recommend exercising at least 30 minutes per day at least 3 days a week, preferably 4 or 5.  We also recommend a diet low in fat and sugar.  Inactivity, poor dietary choices and obesity can cause diabetes, heart attack, stroke, and kidney damage, among others.    ALCOHOL AND SMOKING:  Women should limit their alcohol intake to no more than 7 drinks/beers/glasses of wine (combined, not each!) per week. Moderation of alcohol intake to this level decreases your risk of breast cancer and liver damage. And of course, no recreational drugs are part of a healthy lifestyle.  And absolutely no smoking or even second hand smoke. Most people know smoking can cause heart and lung diseases, but did you know it also contributes to weakening of your bones? Aging of your skin?  Yellowing of your teeth and nails?  CALCIUM  AND VITAMIN D :  Adequate intake of calcium  and Vitamin D  are recommended.  The recommendations for exact amounts of these supplements seem to change often,  but generally speaking 600 mg of calcium  (either carbonate or citrate) and 800 units of Vitamin D  per day seems prudent. Certain women may benefit from higher intake of Vitamin D .  If you are among these women, your doctor will have told you during your visit.    PAP SMEARS:  Pap smears, to check for cervical cancer or precancers,  have traditionally been done yearly, although recent scientific advances have shown that most women can have pap smears less often.  However, every woman still should have a physical exam from her gynecologist every year. It will include a breast check, inspection of the vulva and vagina to check for abnormal growths or skin changes, a visual exam of the cervix, and then an exam to evaluate the size and shape of the uterus and ovaries.  And after 68 years of age, a rectal exam is indicated to check for rectal cancers. We will also provide age appropriate advice regarding health maintenance, like when you should have certain vaccines, screening for sexually transmitted diseases, bone density testing, colonoscopy, mammograms, etc.   MAMMOGRAMS:  All women over 9 years old should have a yearly mammogram. Many facilities now offer a "3D" mammogram, which may cost around $50 extra out of pocket. If possible,  we recommend you accept  the option to have the 3D mammogram performed.  It both reduces the number of women who will be called back for extra views which then turn out to be normal, and it is better than the routine mammogram at detecting truly abnormal areas.    COLONOSCOPY:  Colonoscopy to screen for colon cancer is recommended for all women at age 41.  We know, you hate the idea of the prep.  We agree, BUT, having colon cancer and not knowing it is worse!!  Colon cancer so often starts as a polyp that can be seen and removed at colonscopy, which can quite literally save your life!  And if your first colonoscopy is normal and you have no family history of colon cancer, most women  don't have to have it again for 10 years.  Once every ten years, you can do something that may end up saving your life, right?  We will be happy to help you get it scheduled when you are ready.  Be sure to check your insurance coverage so you understand how much it will cost.  It may be covered as a preventative service at no cost, but you should check your particular policy.

## 2024-04-27 ENCOUNTER — Encounter: Payer: Self-pay | Admitting: Neurology

## 2024-04-27 ENCOUNTER — Encounter: Payer: Self-pay | Admitting: Hematology and Oncology

## 2024-04-28 ENCOUNTER — Ambulatory Visit: Admitting: Rehabilitative and Restorative Service Providers"

## 2024-04-28 ENCOUNTER — Encounter: Payer: Self-pay | Admitting: Rehabilitative and Restorative Service Providers"

## 2024-04-28 DIAGNOSIS — M546 Pain in thoracic spine: Secondary | ICD-10-CM | POA: Diagnosis not present

## 2024-04-28 DIAGNOSIS — M25511 Pain in right shoulder: Secondary | ICD-10-CM

## 2024-04-28 DIAGNOSIS — G8929 Other chronic pain: Secondary | ICD-10-CM | POA: Diagnosis not present

## 2024-04-28 DIAGNOSIS — R293 Abnormal posture: Secondary | ICD-10-CM | POA: Diagnosis not present

## 2024-04-28 DIAGNOSIS — M6281 Muscle weakness (generalized): Secondary | ICD-10-CM | POA: Diagnosis not present

## 2024-04-28 DIAGNOSIS — M542 Cervicalgia: Secondary | ICD-10-CM

## 2024-04-28 NOTE — Therapy (Addendum)
 OUTPATIENT PHYSICAL THERAPY TREATMENT / DISCHARGE   Patient Name: Jennifer [name] MRN: [id number] DOB:Nov 06, 1956, 68 y.o., female Today's Date: 04/28/2024  END OF SESSION:  PT End of Session - 04/28/24 1019     Visit Number 6    Number of Visits 20    Date for PT Re-Evaluation 05/29/24    Authorization Type [employer]    Progress Note Due on Visit 10    PT Start Time 1015    PT Stop Time 1055    PT Time Calculation (min) 40 min    Activity Tolerance Patient tolerated treatment well    Behavior During Therapy WFL for tasks assessed/performed              Past Medical History:  Diagnosis Date   Allergic rhinitis    Allergy    seasonal   Anemia    Anxiety    Arthritis    hands, knees   Asthma    as a child   Blood transfusion without reported diagnosis    as a child   Breast cancer (HCC) 2019   Right Breast Cancer   Cancer (HCC) 09/2018   Breast CA new DX, right breast   Cervical dysplasia    Common bile duct dilation    Diabetes mellitus without complication (HCC)    Endometriosis    Esophageal reflux    Family history of adverse reaction to anesthesia    younger sister anxiety for a dew days after   Family history of breast cancer    Family history of breast cancer    Family history of prostate cancer    Hepatic cyst 08/2020   multiple   Hepatic hemangioma 08/2020   intrahepatic hemangioma   Hiatal hernia    HSV-1 (herpes simplex virus 1) infection    Hyperlipidemia    Hypertension    pt.denies 10/19/22   Hypothyroid    Migraine with aura, without mention of intractable migraine without mention of status migrainosus    Osteopenia 08/2019   T score -2.2 FRAX 11% / 1.7%   Osteoporosis 05/07/2008   Qualifier: Diagnosis of  By: Randeen MD, Laine Caldron    Personal history of radiation therapy 2019   Right Breast Cancer   Pre-diabetes    Raynaud's disease    Past Surgical History:  Procedure Laterality Date   ABDOMINAL HYSTERECTOMY      BIOPSY  09/12/2020   Procedure: BIOPSY;  Surgeon: Teressa Toribio SQUIBB, MD;  Location: [facility];  Service: Endoscopy;;   BREAST BIOPSY Left 11/27/2022   MM LT BREAST BX W LOC DEV 1ST LESION IMAGE BX SPEC STEREO GUIDE 11/27/2022 [facility]   BREAST EXCISIONAL BIOPSY Bilateral over 10 years ago   benign x 2    BREAST LUMPECTOMY Right 10/10/2018   BREAST LUMPECTOMY WITH RADIOACTIVE SEED AND SENTINEL LYMPH NODE BIOPSY Right 10/10/2018   Procedure: RIGHT BREAST LUMPECTOMY WITH RADIOACTIVE SEED AND RIGHT SENTINEL LYMPH NODE BIOPSY;  Surgeon: Curvin Mt III, MD;  Location: [facility];  Service: General;  Laterality: Right;   BREAST SURGERY     Benign breast lump excised   CERVICAL CONE BIOPSY  1985   severe dysplasia   COLONOSCOPY  2005 and dec 2019   normal   COLPOSCOPY     endoscopy  10/2018   ESOPHAGOGASTRODUODENOSCOPY (EGD) WITH PROPOFOL  N/A 09/12/2020   Procedure: ESOPHAGOGASTRODUODENOSCOPY (EGD) WITH PROPOFOL ;  Surgeon: Teressa Toribio SQUIBB, MD;  Location: [facility];  Service: Endoscopy;  Laterality:  N/A;   EUS N/A 09/12/2020   Procedure: UPPER ENDOSCOPIC ULTRASOUND (EUS) RADIAL;  Surgeon: Teressa Toribio SQUIBB, MD;  Location: [facility];  Service: Endoscopy;  Laterality: N/A;   LAPAROSCOPIC ASSISTED VAGINAL HYSTERECTOMY     LAPAROSCOPIC BILATERAL SALPINGO OOPHERECTOMY Bilateral 10/27/2019   Procedure: LAPAROSCOPIC BILATERAL SALPINGO OOPHORECTOMY WITH PERITONEAL WASHINGS;  Surgeon: Lavoie, Marie-Lyne, MD;  Location: [facility];  Service: Gynecology;  Laterality: Bilateral;  request 1:00pm on Friday, Dec. 4th in [place] Iqueue time held requests one hour OR time   moles removed from upper back, face lft, 1 between breasts, upper leg left inside  10/18/2019   wearing small round bandaids on for 2 weeks   ROTATOR CUFF REPAIR Bilateral 08/2008   UPPER GASTROINTESTINAL ENDOSCOPY     Patient Active Problem List   Diagnosis Date Noted   Cerumen  impaction 11/10/2023   Current use of proton pump inhibitor 09/12/2023   Chronic migraine without aura, with intractable migraine, so stated, with status migrainosus 07/29/2023   Migraine without aura and without status migrainosus, not intractable 07/29/2023   Thoracic back pain 01/21/2023   Pain in right shoulder 08/12/2022   Raynaud's disease 03/17/2022   Iron  deficiency anemia 09/08/2020   Common bile duct dilatation 09/06/2020   History of breast cancer 03/01/2020   Genetic testing 11/14/2019   Family history of prostate cancer    Family history of breast cancer    Malignant neoplasm of upper-inner quadrant of right breast in female, estrogen receptor positive (HCC) 09/20/2018   B12 deficiency 04/13/2018   Joint pain 04/11/2018   Colon cancer screening 09/16/2017   Prediabetes 07/28/2016   Allergic rhinitis 11/13/2014   Hyperlipidemia, mild 09/26/2014   Routine general medical examination at a health care facility 10/28/2012   GERD 09/16/2010   Hypothyroidism 05/07/2008   Osteoporosis 05/07/2008   Migraine with aura 11/11/2007    PCP: Randeen Laine FELIX MD  REFERRING PROVIDER: Burnetta Brunet, DO  REFERRING DIAG: [phone] (ICD-10-CM) - Chronic right shoulder pain M12.811 (ICD-10-CM) - Rotator cuff arthropathy of right shoulder M62.830 (ICD-10-CM) - Spasm of right trapezius muscle  THERAPY DIAG:  Cervicalgia  Pain in thoracic spine  Chronic right shoulder pain  Abnormal posture  Muscle weakness (generalized)  Rationale for Evaluation and Treatment: Rehabilitation  ONSET DATE: Acute on Chronic, Feb 2025 worsening.   SUBJECTIVE:  SUBJECTIVE STATEMENT: Pt indicated feeling good today and did not reported specific pian today.    PERTINENT HISTORY:  Arthritis,  history of Rt breast cancer, DM, osteopenia, raynauds disease, history of ED visits related to migraines  PAIN:  NPRS scale:: 1/10 Pain location: Rt shoulder Pain description: constant, sharp, tightness at times.  Aggravating factors lifting Relieving factors: injection, HEP  PRECAUTIONS: None  RED FLAGS: None  WEIGHT BEARING RESTRICTIONS: No  FALLS:  Has patient fallen in last 6 months? 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    OBJECTIVE:   PATIENT SURVEYS: Patient-Specific Activity Scoring Scheme  0 represents "unable to perform." 10 represents "able to perform at prior level. 0 1 2 3 4 5 6 7 8 9  10 (Date and Score)   Activity Eval  03/20/2024  04/24/2024  1. Reaching overhead 7   9  2. Lifting household items  6  9  3. Sleeping 5 8  4.    5.    Score 6 avg 8.667   Total score = sum of the activity scores/number of activities Minimum detectable change (90%CI) for average score = 2 points Minimum detectable change (90%CI) for single activity score = 3 points   COGNITION: 03/20/2024 Overall cognitive status: Within functional limits for tasks assessed  SENSATION: 03/20/2024 [facility]  POSTURE:  03/20/2024 rounded shoulders, forward head, and increased thoracic kyphosis  PALPATION: 03/20/2024 Trigger points, palpation tenderness with concordant symptoms Rt upper trap, Rt levator scap   CERVICAL ROM:   ROM AROM (deg) Eval 03/20/2024 AROM 04/07/2024  Flexion 57 68  Extension 50 c Rt neck pulling  60  Right lateral flexion    Left lateral flexion    Right rotation 72 c Rt neck pain 70  Left rotation 68 70   (Blank rows = not tested)  UPPER EXTREMITY ROM:   ROM Right Eval 03/20/2024 Left Eval 03/20/2024  Shoulder flexion [facility]  Shoulder extension     Shoulder abduction    Shoulder adduction    Shoulder extension    Shoulder internal rotation WFL   Shoulder external rotation [facility]   Elbow flexion    Elbow extension    Wrist flexion    Wrist extension    Wrist ulnar deviation    Wrist radial deviation    Wrist pronation    Wrist supination     (Blank rows = not tested)  UPPER EXTREMITY MMT:  MMT Right Eval 03/20/2024 Left Eval 03/20/2024 Right 04/07/2024  Right 04/24/2024  Shoulder flexion 4+/5 5/5 5/5 5/5  Shoulder extension      Shoulder abduction 3+/5 c pain 4/5 4/5 4+/5  Shoulder adduction      Shoulder extension      Shoulder internal rotation 5/5 5/5  5/5  Shoulder external rotation 4/5 5/5  4+/5  Middle trapezius      Lower trapezius      Elbow flexion 5/5 5/5    Elbow extension 5/5 5/5    Wrist flexion      Wrist extension      Wrist ulnar deviation      Wrist radial deviation      Wrist pronation      Wrist supination      Grip strength       (Blank rows = not tested)  SPECIAL TESTS:  03/20/2024  No cervical testing indicated. (-) drop arm on Rt, (+) painful arc in lowering on Rt shoulder elevation  FUNCTIONAL TESTS:  03/20/2024 No specific testing today                                                                                                                                                                                 TODAY'S TREATMENT:                                                                                                       DATE: 04/28/2024 Neuro Re-ed (to improve postural recruitment/activation/awareness) Green tband bilateral rows c scapular retraction 2 x 15 Green tband bilateral gh ext 2 x 15   Therex: UBE fwd/back 3.5 mins each way lvl 3 with 1 min rest between directions  Green band ER with towel under arm 2 x 15 bilateral  Review of existing HEP for techniques. Review of percussive device use for frequency, time of using, etc for self care use at home.    Manual Seated  STM c percussive device to bilateral upper trap, Rt infraspinatus, levator scap   TODAY'S TREATMENT:                                                                                                       DATE: 04/24/2024 Neuro Re-ed (to improve postural recruitment/activation/awareness) Prone scapular retraction with GH ext bilateral 5 sec hold x 10  Prone scapular retraction c horizontal abduction lift bilateral 5 sec hold x 10   Therex: UBE fwd/back 4 mins each way lvl 2.5 with 1 min rest between directions  Green band ER with towel under arm 2 x 15 bilateral   Manual Prone lying, STM c percussive device to bilateral upper trap, Rt infraspinatus, levator scap   Trigger Point Dry Needling Subsequent Treatment: Instructions provided  previously at initial dry needling treatment.  Patient Verbal Consent Given: Yes Education Handout Provided: Previously Provided Muscles treated: Rt upper trap, Rt infraspinatus Treatment response/outcome: local twitch response  TODAY'S TREATMENT:                                                                                                       DATE: 04/10/2024 Neuro Re-ed (to improve postural recruitment/activation/awareness) Prone scapular retraction with GH ext bilateral 5 sec hold x 10  Seated cervical retraction into ball at wall 5 sec hold x 10  Seated scapular retraction c bilateral UE ER 2-3 sec hold x 10 , with green band x 15 total    Therex: UBE fwd/back 3 mins each way lvl 3.0 with 1 min rest between directions  Seated thoracic extension over chair with head in neutral 2-3 sec hold x 10  Upper trap stretch 15 sec x 5 Rt Verbal review of stretching.   Manual Prone lying, STM c percussive device to bilateral upper trap, Rt infraspinatus, levator scap Prone G3 cPA T3-T8   Trigger Point Dry Needling Subsequent Treatment: Instructions provided previously at initial dry needling treatment.  Patient Verbal Consent Given: Yes Education  Handout Provided: Previously Provided Muscles treated: Rt upper trap  Treatment response/outcome: local twitch response    PATIENT EDUCATION:  03/20/2024 Education details: HEP, POC DN (previously provided Person educated: Patient Education method: Explanation, Demonstration, Verbal cues, and Handouts Education comprehension: verbalized understanding, returned demonstration, and verbal cues required  HOME EXERCISE PROGRAM: Access Code: [email or web address] URL: [email or web address] Date: 03/20/2024 Prepared by: Ozell Silvan  Exercises - Seated Upper Trapezius Stretch  - 2 x daily - 7 x weekly - 1 sets - 5 reps - 15 hold - Gentle Levator Scapulae Stretch (Mirrored)  - 2 x daily - 7 x weekly - 1 sets - 5 reps - 15 hold - Standing Shoulder Posterior Capsule Stretch (Mirrored)  - 2 x daily - 7 x weekly - 1 sets - 5 reps - 15 hold - Standing Isometric Shoulder External Rotation with Doorway (Mirrored)  - 1-2 x daily - 7 x weekly - 1 sets - 10 reps - 5 hold - Standing Isometric Shoulder Abduction with Doorway - Arm Bent (Mirrored)  - 1-2 x daily - 7 x weekly - 1 sets - 10 reps - 5-10 hold - Standing Bilateral Low Shoulder Row with Anchored Resistance  - 1-2 x daily - 7 x weekly - 2-3 sets - 10-15 reps - Shoulder Extension with Resistance  - 1-2 x daily - 7 x weekly - 1-2 sets - 10-15 reps - Prone Scapular Retraction  - 1 x daily - 7 x weekly - 1 sets - 10 reps - 5 hold - Prone Scapular Slide with Shoulder Extension  - 1 x daily - 7 x weekly - 1 sets - 10 reps - 5 hold - Prone Scapular Retraction Arms at Side  - 1 x daily - 7 x weekly - 1 sets - 10 reps - 5 hold  ASSESSMENT:  CLINICAL IMPRESSION: Pt has reported continued reduction in  pain symptoms with improved management of symptoms with HEP.  Dicussed continued percussive device use at home as well as HEP.  PT was appropriate for trial HEP at this time.    Deferred dry needling today in part due to symptom presentation better as  well as cost.    OBJECTIVE IMPAIRMENTS: decreased activity tolerance, decreased coordination, decreased endurance, decreased mobility, decreased ROM, decreased strength, increased fascial restrictions, impaired perceived functional ability, increased muscle spasms, impaired flexibility, impaired UE functional use, improper body mechanics, postural dysfunction, and pain.   ACTIVITY LIMITATIONS: carrying, lifting, bending, sitting, sleeping, reach over head, and hygiene/grooming  PARTICIPATION LIMITATIONS: meal prep, cleaning, laundry, interpersonal relationship, driving, shopping, community activity, and house reno  PERSONAL FACTORS: Time since onset of injury/illness/exacerbation and Arthritis, history of Rt breast cancer, DM, osteopenia, raynauds disease, history of ED visits related to migraines are also affecting patient's functional outcome.   REHAB POTENTIAL: Good  CLINICAL DECISION MAKING: Evolving/moderate complexity  EVALUATION COMPLEXITY: Moderate   GOALS: Goals reviewed with patient? Yes  SHORT TERM GOALS: (target date for Short term goals are 3 weeks 04/10/2024)  1.Patient will demonstrate independent use of home exercise program to maintain progress from in clinic treatments. Goal status: Met  LONG TERM GOALS: (target dates for all long term goals are 10 weeks  05/29/2024 )   1. Patient will demonstrate/report pain at worst less than or equal to 2/10 to facilitate minimal limitation in daily activity secondary to pain symptoms. Goal status: Met 04/28/2024   2. Patient will demonstrate independent use of home exercise program to facilitate ability to maintain/progress functional gains from skilled physical therapy services. Goal status: Met 04/28/2024   3. Patient will demonstrate Patient specific functional scale avg > or = 8/10 to indicate reduced disability due to condition.  Goal status: Met 04/28/2024   4.  Patient will demonstrate cervical AROM WFL s symptoms to  facilitate usual head movements for daily activity including driving, self care.   Goal status: Mostly Met 04/28/2024   5.  Patient will demonstrate bilateral shoulder MMT 5/5 throughout s symptoms to facilitate usual lifting around house at [facility].   Goal status: on going 04/07/2024   6.  Patient will demonstrate ability to sleep s restriction for symptoms.  Goal status: Met 04/28/2024     PLAN:  PT FREQUENCY: 1-2x/week  PT DURATION: 10 weeks  Can include 02853- PT Re-evaluation, 97110-Therapeutic exercises, 97530- Therapeutic activity, 97112- Neuromuscular re-education, 97535- Self Care, 97140- Manual therapy, [phone]- Gait training, [phone]- Orthotic Fit/training, [phone]- Canalith repositioning, [id number]- Aquatic Therapy, [phone] Electrical stimulation (unattended), [phone]- Electrical stimulation (manual), [id number]- Vasopneumatic device, [id number]- Ultrasound, [id number] Physical performance testing, [id number]- Traction (mechanical), [id number]- Ionotophoresis 4mg /ml Dexamethasone , Patient/Family education, Balance training, Stair training, Taping, Dry Needling, Joint mobilization, Joint manipulation, Spinal manipulation, Spinal mobilization, Scar mobilization, Vestibular training, Visual/preceptual remediation/compensation, DME instructions, Cryotherapy, and Moist heat.  All performed as medically necessary.  All included unless contraindicated  PLAN FOR NEXT SESSION:  HEP trial. Discharge after 30 days inactivity.   Ozell Silvan, PT, DPT, OCS, ATC 04/28/24  10:51 AM   PHYSICAL THERAPY DISCHARGE SUMMARY  Visits from Start of Care: 6  Current functional level related to goals / functional outcomes: See note   Remaining deficits: See note   Education / Equipment: HEP  Patient goals were partially met. Patient is being discharged due to not returning since the last visit.  Ozell Silvan, PT, DPT, OCS, ATC 07/10/24  2:43 PM

## 2024-05-16 ENCOUNTER — Other Ambulatory Visit: Payer: Self-pay | Admitting: Neurology

## 2024-05-24 ENCOUNTER — Encounter: Payer: Self-pay | Admitting: Neurology

## 2024-05-30 ENCOUNTER — Encounter: Payer: Self-pay | Admitting: Obstetrics and Gynecology

## 2024-05-30 ENCOUNTER — Ambulatory Visit (INDEPENDENT_AMBULATORY_CARE_PROVIDER_SITE_OTHER): Admitting: Obstetrics and Gynecology

## 2024-05-30 ENCOUNTER — Ambulatory Visit

## 2024-05-30 ENCOUNTER — Ambulatory Visit (INDEPENDENT_AMBULATORY_CARE_PROVIDER_SITE_OTHER): Admitting: Internal Medicine

## 2024-05-30 ENCOUNTER — Encounter: Payer: Self-pay | Admitting: Internal Medicine

## 2024-05-30 VITALS — BP 126/84 | HR 64

## 2024-05-30 VITALS — BP 118/72 | HR 58 | Temp 98.1°F | Ht 61.0 in | Wt 124.0 lb

## 2024-05-30 DIAGNOSIS — D219 Benign neoplasm of connective and other soft tissue, unspecified: Secondary | ICD-10-CM

## 2024-05-30 DIAGNOSIS — M25562 Pain in left knee: Secondary | ICD-10-CM | POA: Diagnosis not present

## 2024-05-30 DIAGNOSIS — Z719 Counseling, unspecified: Secondary | ICD-10-CM

## 2024-05-30 DIAGNOSIS — Z01411 Encounter for gynecological examination (general) (routine) with abnormal findings: Secondary | ICD-10-CM

## 2024-05-30 NOTE — Patient Instructions (Signed)
 Fibroids in the Uterus: What to Know  Fibroids are growths (tumors) in the uterus. Fibroids are not cancer. Most people with this condition do not need treatment. Sometimes, fibroids can make it hard to get pregnant. If this happens, you may need surgery to take out the fibroids. What are the causes? The cause of this condition is not known. What increases the risk? You're in your 30s or 40s and have not stopped having a period. You have family members who have had fibroids. You're of African American descent. You started your period at age 63 or younger. You've not given birth. You're overweight or you're obese. You eat a diet low in fruits, vegetables, and vitamin D . What are the signs or symptoms? Heavy bleeding during your period. Bleeding between periods. Pain in the area between your hips (pelvis). Pain during sex. Needing to pee right away or peeing more often than usual. Not being able to have children. Not being able to stay pregnant (miscarriage). Many people do not have symptoms. How is this treated? Treatment may include: Medicines to help with pain, such as aspirin or ibuprofen. Hormone treatment. This may be given as a pill, in a shot, or with a type of birth control device called an IUD. Surgery. This may be done to: Take out the fibroids. This may be done if you want to become pregnant. Take out the uterus. Stop the blood flow to the fibroids. Procedures to shrink the fibroids. This may be done with: Heat and radio energy. Ultrasound waves. Follow these instructions at home: Medicines Take your medicines only as told. You can lose a lot of iron because of heavy bleeding from your period. Ask your doctor if you should: Take iron pills. Eat more foods that have iron in them, such as dark green, leafy vegetables. Managing pain  Put heat on your back or belly as told. Use the heat source that your doctor recommends, such as a moist heat pack or a heating pad. Do  this as often as told. Put a towel between your skin and the heat source. Leave the heat on for 20-30 minutes. If your skin turns red, take off the heat right away to prevent burns. The risk of burns is higher if you can't feel pain, heat, or cold. General instructions Tell your doctor about any changes in your period, such as: Heavy bleeding that needs a change of tampons or pads more than normal. A change in how many days your period lasts. A change in symptoms that come with your period. This might be cramps in your belly or pain in your back. Keep all follow-up visits. Your doctor needs to check your fibroids for any changes. Contact a doctor if: You have pain that does not get better with medicine or heat. This may include pain or cramps in: The area between your hip bones. Your back. Your belly. You have new bleeding between your periods. You have more bleeding during or between your periods. You feel very tired or weak. You feel dizzy. Get help right away if: You faint. You have pain in the area between your hip bones that gets worse. You have bleeding that soaks a tampon or pad in 30 minutes or less. This information is not intended to replace advice given to you by your health care provider. Make sure you discuss any questions you have with your health care provider. Document Revised: 05/11/2023 Document Reviewed: 05/11/2023 Elsevier Patient Education  2024 [employer].
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
## 2024-05-30 NOTE — Assessment & Plan Note (Signed)
 Discussed that there doesn't seem to be any significant internal derangement like ligament/meniscus tear No evidence of sig underlying OA in the past Discussed ice intermittently Diclofenac topical Next step would be orthopedist or sports medicine ([place])
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
## 2024-06-02 ENCOUNTER — Ambulatory Visit: Admitting: Podiatry

## 2024-06-02 DIAGNOSIS — L603 Nail dystrophy: Secondary | ICD-10-CM | POA: Diagnosis not present

## 2024-06-02 NOTE — Progress Notes (Signed)
 Subjective:  Patient ID: Jennifer [name], female    DOB: [birth year],  MRN: [id number]  Chief Complaint  Patient presents with   Nail Problem    Pt is concerned about the discoloration on her nails     68 y.o. female presents with the above complaint.  Patient presents with complaint bilateral hallux nail dystrophy.  She noted some discoloration.  She goes to a nail salon and puts nail polish and coloring on.  She just wanted to get it evaluated.  She has not seen anyone as prior to seeing me denies any other acute complaints would like to discuss treatment options for this.   Review of Systems: Negative except as noted in the HPI. Denies N/V/F/Ch.  Past Medical History:  Diagnosis Date   Allergic rhinitis    Allergy    seasonal   Anemia    Anxiety    Arthritis    hands, knees   Asthma    as a child   Blood transfusion without reported diagnosis    as a child   Breast cancer (HCC) 2019   Right Breast Cancer   Cancer (HCC) 09/2018   Breast CA new DX, right breast   Cervical dysplasia    Common bile duct dilation    Diabetes mellitus without complication (HCC)    Endometriosis    Esophageal reflux    Family history of adverse reaction to anesthesia    younger sister anxiety for a dew days after   Family history of breast cancer    Family history of breast cancer    Family history of prostate cancer    Hepatic cyst 08/2020   multiple   Hepatic hemangioma 08/2020   intrahepatic hemangioma   Hiatal hernia    HSV-1 (herpes simplex virus 1) infection    Hyperlipidemia    Hypertension    pt.denies 10/19/22   Hypothyroid    Migraine with aura, without mention of intractable migraine without mention of status migrainosus    Osteopenia 08/2019   T score -2.2 FRAX 11% / 1.7%   Osteoporosis 05/07/2008   Qualifier: Diagnosis of  By: Randeen MD, Laine Caldron    Personal history of radiation therapy 2019   Right Breast Cancer   Pre-diabetes    Raynaud's disease     Current  Outpatient Medications:    ALPRAZolam  (XANAX ) 0.5 MG tablet, TAKE 1/2 TO 1 TABLET BY MOUTH ONCE DAILY AS NEEDED FOR SLEEP/ANXIETY, Disp: 15 tablet, Rfl: 2   atenolol  (TENORMIN ) 25 MG tablet, Take 12.5 mg by mouth 3 (three) times daily. For migraine prevention, Disp: , Rfl:    B Complex Vitamins (B COMPLEX PO), Take by mouth., Disp: , Rfl:    baclofen  (LIORESAL ) 10 MG tablet, Take 1 tablet (10 mg total) by mouth 3 (three) times daily., Disp: 30 each, Rfl: 0   CALCIUM  CARBONATE-VITAMIN D  PO, Take 1 tablet by mouth daily. 1000 mg, Disp: , Rfl:    denosumab  (PROLIA ) 60 MG/ML SOSY injection, Inject 60 mg into the skin every 6 (six) months., Disp: , Rfl:    Eptinezumab -jjmr (VYEPTI  IV), Inject into the vein every 3 (three) months., Disp: , Rfl:    famotidine  (PEPCID ) 40 MG tablet, Take 40 mg by mouth 2 (two) times daily. , Disp: , Rfl:    Fremanezumab -vfrm (AJOVY ) 225 MG/1.5ML SOAJ, Inject 225 mg into the skin every 30 (thirty) days. (Patient not taking: Reported on 05/30/2024), Disp: 1.5 mL, Rfl: 11   Fremanezumab -vfrm (AJOVY ) 225 MG/1.5ML  SOAJ, Inject 225mg  into skin every 30 days.  2 samples of AJOVY  NDC 4824079788, LOT [id number]  exp 11/27., Disp: 1.5 mL, Rfl: 1   hydrocortisone  2.5 % cream, Apply to affected areas BID PRN as directed., Disp: 30 g, Rfl: 3   hydrOXYzine  (ATARAX ) 25 MG tablet, Take 37.5 mg by mouth at bedtime as needed., Disp: , Rfl:    ketorolac  (TORADOL ) 10 MG tablet, Take by mouth., Disp: , Rfl:    letrozole  (FEMARA ) 2.5 MG tablet, Take 1 tablet (2.5 mg total) by mouth daily., Disp: 90 tablet, Rfl: 3   levocetirizine (XYZAL) 5 MG tablet, Take 5 mg by mouth at bedtime. , Disp: , Rfl: 3   levothyroxine  (SYNTHROID ) 75 MCG tablet, TAKE 1 TABLET BY MOUTH EVERY DAY BEFORE BREAKFAST, Disp: 90 tablet, Rfl: 2   magnesium  oxide (MAG-OX) 400 MG tablet, Take by mouth., Disp: , Rfl:    Magnesium  Oxide 400 (240 Mg) MG TABS, Take 400 mg by mouth daily. , Disp: , Rfl:    metFORMIN  (GLUCOPHAGE -XR) 500  MG 24 hr tablet, Take 1 tablet (500 mg total) by mouth daily with breakfast., Disp: 90 tablet, Rfl: 3   Multiple Vitamin (MULTIVITAMIN WITH MINERALS) TABS tablet, Take 1 tablet by mouth daily., Disp: , Rfl:    nabumetone  (RELAFEN ) 500 MG tablet, Take 500 mg by mouth 2 (two) times daily., Disp: , Rfl:    naratriptan  (AMERGE) 2.5 MG tablet, TAKE 1 TABLET (2.5 MG TOTAL) BY MOUTH AS NEEDED FOR MIGRAINE. TAKE ONE (1) TABLET AT ONSET OF HEADACHE IF RETURNS OR DOES NOT RESOLVE, MAY REPEAT AFTER 4 HOURS DO NOT EXCEED FIVE (5) MG IN 24 HOURS., Disp: 9 tablet, Rfl: 1   neomycin-polymyxin b-dexamethasone  (MAXITROL) 3.5-10000-0.1 SUSP, 1 drop 3 (three) times daily., Disp: , Rfl:    Omega-3 Fatty Acids (FISH OIL) 1000 MG CAPS, Take 1,000 mg by mouth daily. , Disp: , Rfl:    OnabotulinumtoxinA  (BOTOX  IJ), Inject 1 Dose as directed every 6 (six) weeks. , Disp: , Rfl:    ondansetron  (ZOFRAN  ODT) 4 MG disintegrating tablet, Take 1 tablet (4 mg total) by mouth every 8 (eight) hours as needed., Disp: 20 tablet, Rfl: 0   pantoprazole  (PROTONIX ) 20 MG tablet, TAKE 1 TABLET BY MOUTH EVERY DAY, Disp: 90 tablet, Rfl: 1   PRESCRIPTION MEDICATION, Inject into the skin every 6 (six) weeks. Steroid Trigger, Disp: , Rfl:    Probiotic Product (PROBIOTIC-10 PO), Take 1 capsule by mouth daily. , Disp: , Rfl:    promethazine  (PHENERGAN ) 25 MG tablet, Take 25 mg by mouth daily as needed., Disp: , Rfl:    rizatriptan  (MAXALT ) 10 MG tablet, Take 1 tablet (10 mg total) by mouth once as needed for up to 10 days for migraine. May repeat in 2 hours if needed, Disp: 10 tablet, Rfl: 0   triamcinolone  cream (KENALOG ) 0.1 %, Apply to affected areas BID PRN for up to two weeks at a time as directed., Disp: 30 g, Rfl: 1   TURMERIC PO, Take 1 tablet by mouth daily., Disp: , Rfl:    Zavegepant HCl (ZAVZPRET ) 10 MG/ACT SOLN, Place 10 mg into the nose daily as needed (onset migraine). NDC 36460864-97, LOT [id number]  exp 11/2024 (Patient not taking:  Reported on 05/30/2024), Disp: 1 each, Rfl: 1  Social History   Tobacco Use  Smoking Status Never   Passive exposure: Never  Smokeless Tobacco Never    Allergies  Allergen Reactions   Ciprofloxacin Other (See Comments)  Dizziness    Codeine Phosphate Other (See Comments)    Dizziness, heart palpitations, nervous   Decongestant [Pseudoephedrine] Other (See Comments)    dizziness   Diclofenac    Diphenhydramine Hcl (Sleep)    Flonase  [Fluticasone  Propionate] Other (See Comments)    Worsens her migraine    Fluticasone     Hydrocodone  Other (See Comments)    Made nervous--10/09 surgery rotator cuff   Macrobid  [Nitrofurantoin ]     headaches   Nasal Spray    Nasonex  [Mometasone ]     Migraines   Reglan [Metoclopramide] Other (See Comments)    Dizziness   Topamax [Topiramate] Other (See Comments)    Dizziness    Benadryl [Diphenhydramine Hcl] Palpitations   Sulfa Antibiotics Anxiety   Objective:  There were no vitals filed for this visit. There is no height or weight on file to calculate BMI. Constitutional Well developed. Well nourished.  Vascular Dorsalis pedis pulses palpable bilaterally. Posterior tibial pulses palpable bilaterally. Capillary refill normal to all digits.  No cyanosis or clubbing noted. Pedal hair growth normal.  Neurologic Normal speech. Oriented to person, place, and time. Epicritic sensation to light touch grossly present bilaterally.  Dermatologic Nails discoloration whitish noted to both bilateral hallux.  No thickness or signs of fungus noted.  Likely due to dehydration of the nail secondary to nail polish remover Skin within normal limits  Orthopedic: Normal joint ROM without pain or crepitus bilaterally. No visible deformities. No bony tenderness.   Radiographs: None Assessment:   1. Nail dystrophy    Plan:  Patient was evaluated and treated and all questions answered.  Bilateral hallux nail dystrophy - All questions and concerns  were discussed with the patient extensive detail given the amount of discoloration is present at this time and no concern for fungus.  I discussed management of nails in extensive detail.  I encouraged her to discontinue the use of acetone to remove nails.  This likely is due to dehydration of the nail  No follow-ups on file.

## 2024-06-07 ENCOUNTER — Encounter: Payer: Self-pay | Admitting: Neurology

## 2024-06-07 NOTE — Telephone Encounter (Signed)
 Sent message to [facility].

## 2024-06-09 ENCOUNTER — Other Ambulatory Visit: Payer: Self-pay | Admitting: *Deleted

## 2024-06-09 DIAGNOSIS — C50211 Malignant neoplasm of upper-inner quadrant of right female breast: Secondary | ICD-10-CM

## 2024-06-09 LAB — SIGNATERA
SIGNATERA MTM READOUT: 0 MTM/ml
SIGNATERA TEST RESULT: NEGATIVE

## 2024-06-12 ENCOUNTER — Inpatient Hospital Stay: Payer: HMO

## 2024-06-12 ENCOUNTER — Inpatient Hospital Stay: Payer: HMO | Attending: Hematology and Oncology | Admitting: Hematology and Oncology

## 2024-06-12 VITALS — BP 133/71 | HR 70 | Temp 98.1°F | Resp 16 | Ht 61.0 in | Wt 123.2 lb

## 2024-06-12 DIAGNOSIS — Z780 Asymptomatic menopausal state: Secondary | ICD-10-CM

## 2024-06-12 DIAGNOSIS — C50211 Malignant neoplasm of upper-inner quadrant of right female breast: Secondary | ICD-10-CM

## 2024-06-12 DIAGNOSIS — M810 Age-related osteoporosis without current pathological fracture: Secondary | ICD-10-CM | POA: Insufficient documentation

## 2024-06-12 DIAGNOSIS — Z79811 Long term (current) use of aromatase inhibitors: Secondary | ICD-10-CM | POA: Insufficient documentation

## 2024-06-12 DIAGNOSIS — Z1721 Progesterone receptor positive status: Secondary | ICD-10-CM | POA: Diagnosis not present

## 2024-06-12 DIAGNOSIS — Z923 Personal history of irradiation: Secondary | ICD-10-CM | POA: Insufficient documentation

## 2024-06-12 DIAGNOSIS — Z170 Estrogen receptor positive status [ER+]: Secondary | ICD-10-CM | POA: Diagnosis not present

## 2024-06-12 DIAGNOSIS — Z1732 Human epidermal growth factor receptor 2 negative status: Secondary | ICD-10-CM | POA: Insufficient documentation

## 2024-06-12 LAB — CBC WITH DIFFERENTIAL (CANCER CENTER ONLY)
Abs Immature Granulocytes: 0.01 K/uL (ref 0.00–0.07)
Basophils Absolute: 0.1 K/uL (ref 0.0–0.1)
Basophils Relative: 1 %
Eosinophils Absolute: 0.1 K/uL (ref 0.0–0.5)
Eosinophils Relative: 2 %
HCT: 37.5 % (ref 36.0–46.0)
Hemoglobin: 12.7 g/dL (ref 12.0–15.0)
Immature Granulocytes: 0 %
Lymphocytes Relative: 27 %
Lymphs Abs: 1.8 K/uL (ref 0.7–4.0)
MCH: 30.4 pg (ref 26.0–34.0)
MCHC: 33.9 g/dL (ref 30.0–36.0)
MCV: 89.7 fL (ref 80.0–100.0)
Monocytes Absolute: 0.6 K/uL (ref 0.1–1.0)
Monocytes Relative: 8 %
Neutro Abs: 4.1 K/uL (ref 1.7–7.7)
Neutrophils Relative %: 62 %
Platelet Count: 323 K/uL (ref 150–400)
RBC: 4.18 MIL/uL (ref 3.87–5.11)
RDW: 12.1 % (ref 11.5–15.5)
WBC Count: 6.6 K/uL (ref 4.0–10.5)
nRBC: 0 % (ref 0.0–0.2)

## 2024-06-12 LAB — CMP (CANCER CENTER ONLY)
ALT: 11 U/L (ref 0–44)
AST: 13 U/L — ABNORMAL LOW (ref 15–41)
Albumin: 4.2 g/dL (ref 3.5–5.0)
Alkaline Phosphatase: 61 U/L (ref 38–126)
Anion gap: 5 (ref 5–15)
BUN: 16 mg/dL (ref 8–23)
CO2: 31 mmol/L (ref 22–32)
Calcium: 9.6 mg/dL (ref 8.9–10.3)
Chloride: 101 mmol/L (ref 98–111)
Creatinine: 0.71 mg/dL (ref 0.44–1.00)
GFR, Estimated: >60 mL/min (ref >60)
Glucose, Bld: 76 mg/dL (ref 70–99)
Potassium: 4.3 mmol/L (ref 3.5–5.1)
Sodium: 137 mmol/L (ref 135–145)
Total Bilirubin: 0.5 mg/dL (ref 0.0–1.2)
Total Protein: 7.1 g/dL (ref 6.5–8.1)

## 2024-06-12 NOTE — Assessment & Plan Note (Signed)
 10/10/2018: Right lumpectomy: IDC grade 1, 1.2 cm, with intermediate grade DCIS, margins negative, 0/2 lymph nodes negative, T1CN0 stage Ia ER 100%, PR 90%, Ki-67 10%, HER-2 1+ negative  Oncotype DX: 29: 18% risk of recurrence    Treatment plan:  1. Recommended systemic adjuvant chemotherapy with Taxotere and Cytoxan every 3 weeks x4 cycles (patient refused)  2. Adjuvant radiation therapy 11/08/2018-12/05/2018  3. Adjuvant antiestrogen therapy with anastrozole  started 12/05/2018 switched to letrozole  10/09/19    Letrozole  toxicities: Denies any adverse effects to letrozole    Breast cancer surveillance: 1.  Mammogram 02/22/2024: Benign, breast density category D: Because of high breast density recommended contrast-enhanced mammograms for annual surveillance 2. bone density 11/08/2023: T score -2.6: Osteoporosis 3.  Breast exam 06/12/2024: Benign   Osteoporosis discussion: Prolia  along with calcium  and vitamin D .  Return to clinic every 6 months for Prolia  and follow-up with me in 1 year

## 2024-06-12 NOTE — Progress Notes (Signed)
 Patient Care Team: Tower, Laine LABOR, MD as PCP - General (Family Medicine) Odean Potts, MD as Consulting Physician (Hematology and Oncology) Dewey Rush, MD as Consulting Physician (Radiation Oncology) Curvin Deward MOULD, MD as Consulting Physician (General Surgery)  DIAGNOSIS:  Encounter Diagnosis  Name Primary?   Malignant neoplasm of upper-inner quadrant of right breast in female, estrogen receptor positive (HCC) Yes    SUMMARY OF ONCOLOGIC HISTORY: Oncology History  Malignant neoplasm of upper-inner quadrant of right breast in female, estrogen receptor positive (HCC)  09/13/2018 Initial Diagnosis   Diagnostic mammogram detected right breast mass with distortion LIQ at 2:00 9 cm from nipple 1.7 cm, at 1:00 there was a benign 1.2 cm fibrocystic change, biopsy of the 2:00 mass revealed grade 1 IDC with DCIS ER 100%, PR 90%, Ki-67 10%, HER-2 1+ negative, T1CN0 stage Ia clinical stage   10/10/2018 Surgery   Right lumpectomy: IDC grade 1, 1.2 cm, with intermediate grade DCIS, margins negative, 0/2 lymph nodes negative, T1CN0 stage Ia ER 100%, PR 90%, Ki-67 10%, HER-2 1+ negative    10/10/2018 Oncotype testing   oncotype results of 29: Risk of recurrence without chemo 18%   11/07/2018 - 12/08/2018 Radiation Therapy   1. Right Breast / 42.56 Gy in 16 fractions 2. Right Breast Boost / 8 Gy in 4 fractions - Total dose 50.56 Gy   12/2018 -  Anti-estrogen oral therapy   Anastrozole  daily, planned for 7 years   11/11/2019 Genetic Testing   Negative genetic testing on the common hereditary cancer panel.  The Common Hereditary Gene Panel offered by [facility] includes sequencing and/or deletion duplication testing of the following 48 genes: APC, ATM, AXIN2, BARD1, BMPR1A, BRCA1, BRCA2, BRIP1, CDH1, CDK4, CDKN2A (p14ARF), CDKN2A (p16INK4a), CHEK2, CTNNA1, DICER1, EPCAM (Deletion/duplication testing only), GREM1 (promoter region deletion/duplication testing only), KIT, MEN1, MLH1, MSH2, MSH3, MSH6,  MUTYH, NBN, NF1, NHTL1, PALB2, PDGFRA, PMS2, POLD1, POLE, PTEN, RAD50, RAD51C, RAD51D, RNF43, SDHB, SDHC, SDHD, SMAD4, SMARCA4. STK11, TP53, TSC1, TSC2, and VHL.  The following genes were evaluated for sequence changes only: SDHA and HOXB13 c.251G>A variant only. The report date is 11/11/2019     CHIEF COMPLIANT:   HISTORY OF PRESENT ILLNESS: Discussed the use of AI scribe software for clinical note transcription with the patient, who gave verbal consent to proceed.  History of Present Illness Jennifer [name] is a 68 year old female with osteoporosis who presents with concerns about Prolia  treatment and dental issues.  She is concerned about her Prolia  treatment due to recent dental issues. After a scare with her molar, an endodontist suggested extraction, but a periodontist found no damage or inflammation and advised caution with Prolia  use. She received her first Prolia  dose three months ago and is worried about the risk of osteonecrosis of the jaw.  She experiences migraines, averaging eleven days per month, with two ER visits monthly. Vyepti  is the most effective treatment, despite a significant copay. She has met her out-of-pocket maximum.  She feels exhausted, attributing it to stress from a family reunion, and has lost weight from 131 to 123 pounds since January. She is making efforts to maintain nutrition.  Her current medications include metformin , Protonix , Zofran  as needed, probiotics, and Prolia  for osteoporosis management.     ALLERGIES:  is allergic to ciprofloxacin, codeine phosphate, decongestant [pseudoephedrine], diclofenac, diphenhydramine hcl (sleep), flonase  [fluticasone  propionate], fluticasone , hydrocodone , macrobid  [nitrofurantoin ], nasal spray, nasonex  [mometasone ], reglan [metoclopramide], topamax [topiramate], benadryl [diphenhydramine hcl], and sulfa antibiotics.  MEDICATIONS:  Current Outpatient  Medications  Medication Sig Dispense Refill   ALPRAZolam  (XANAX ) 0.5  MG tablet TAKE 1/2 TO 1 TABLET BY MOUTH ONCE DAILY AS NEEDED FOR SLEEP/ANXIETY 15 tablet 2   atenolol  (TENORMIN ) 25 MG tablet Take 12.5 mg by mouth 3 (three) times daily. For migraine prevention     B Complex Vitamins (B COMPLEX PO) Take by mouth.     baclofen  (LIORESAL ) 10 MG tablet Take 1 tablet (10 mg total) by mouth 3 (three) times daily. 30 each 0   CALCIUM  CARBONATE-VITAMIN D  PO Take 1 tablet by mouth daily. 1000 mg     denosumab  (PROLIA ) 60 MG/ML SOSY injection Inject 60 mg into the skin every 6 (six) months.     Eptinezumab -jjmr (VYEPTI  IV) Inject into the vein every 3 (three) months.     famotidine  (PEPCID ) 40 MG tablet Take 40 mg by mouth 2 (two) times daily.      Fremanezumab -vfrm (AJOVY ) 225 MG/1.5ML SOAJ Inject 225 mg into the skin every 30 (thirty) days. (Patient not taking: Reported on 05/30/2024) 1.5 mL 11   Fremanezumab -vfrm (AJOVY ) 225 MG/1.5ML SOAJ Inject 225mg  into skin every 30 days.  2 samples of AJOVY  NDC 4824079788, LOT [id number]  exp 11/27. 1.5 mL 1   hydrocortisone  2.5 % cream Apply to affected areas BID PRN as directed. 30 g 3   hydrOXYzine  (ATARAX ) 25 MG tablet Take 37.5 mg by mouth at bedtime as needed.     ketorolac  (TORADOL ) 10 MG tablet Take by mouth.     letrozole  (FEMARA ) 2.5 MG tablet Take 1 tablet (2.5 mg total) by mouth daily. 90 tablet 3   levocetirizine (XYZAL) 5 MG tablet Take 5 mg by mouth at bedtime.   3   levothyroxine  (SYNTHROID ) 75 MCG tablet TAKE 1 TABLET BY MOUTH EVERY DAY BEFORE BREAKFAST 90 tablet 2   magnesium  oxide (MAG-OX) 400 MG tablet Take by mouth.     Magnesium  Oxide 400 (240 Mg) MG TABS Take 400 mg by mouth daily.      metFORMIN  (GLUCOPHAGE -XR) 500 MG 24 hr tablet Take 1 tablet (500 mg total) by mouth daily with breakfast. 90 tablet 3   Multiple Vitamin (MULTIVITAMIN WITH MINERALS) TABS tablet Take 1 tablet by mouth daily.     nabumetone  (RELAFEN ) 500 MG tablet Take 500 mg by mouth 2 (two) times daily.     naratriptan  (AMERGE) 2.5 MG tablet  TAKE 1 TABLET (2.5 MG TOTAL) BY MOUTH AS NEEDED FOR MIGRAINE. TAKE ONE (1) TABLET AT ONSET OF HEADACHE IF RETURNS OR DOES NOT RESOLVE, MAY REPEAT AFTER 4 HOURS DO NOT EXCEED FIVE (5) MG IN 24 HOURS. 9 tablet 1   neomycin-polymyxin b-dexamethasone  (MAXITROL) 3.5-10000-0.1 SUSP 1 drop 3 (three) times daily.     Omega-3 Fatty Acids (FISH OIL) 1000 MG CAPS Take 1,000 mg by mouth daily.      OnabotulinumtoxinA  (BOTOX  IJ) Inject 1 Dose as directed every 6 (six) weeks.      ondansetron  (ZOFRAN  ODT) 4 MG disintegrating tablet Take 1 tablet (4 mg total) by mouth every 8 (eight) hours as needed. 20 tablet 0   pantoprazole  (PROTONIX ) 20 MG tablet TAKE 1 TABLET BY MOUTH EVERY DAY 90 tablet 1   PRESCRIPTION MEDICATION Inject into the skin every 6 (six) weeks. Steroid Trigger     Probiotic Product (PROBIOTIC-10 PO) Take 1 capsule by mouth daily.      promethazine  (PHENERGAN ) 25 MG tablet Take 25 mg by mouth daily as needed.     rizatriptan  (MAXALT ) 10  MG tablet Take 1 tablet (10 mg total) by mouth once as needed for up to 10 days for migraine. May repeat in 2 hours if needed 10 tablet 0   triamcinolone  cream (KENALOG ) 0.1 % Apply to affected areas BID PRN for up to two weeks at a time as directed. 30 g 1   TURMERIC PO Take 1 tablet by mouth daily.     Zavegepant HCl (ZAVZPRET ) 10 MG/ACT SOLN Place 10 mg into the nose daily as needed (onset migraine). NDC 36460864-97, LOT [id number]  exp 11/2024 (Patient not taking: Reported on 05/30/2024) 1 each 1   No current facility-administered medications for this visit.    PHYSICAL EXAMINATION: ECOG PERFORMANCE STATUS: 1 - Symptomatic but completely ambulatory  There were no vitals filed for this visit. There were no vitals filed for this visit.  Physical Exam MEASUREMENTS: Weight- 123 lbs.  (exam performed in the presence of a chaperone)  LABORATORY DATA:  I have reviewed the data as listed    Latest Ref Rng & Units 01/10/2024    1:58 PM 12/14/2023   12:42 PM  10/20/2023    2:10 PM  CMP  Glucose 70 - 99 mg/dL 892  81  84   BUN 8 - 23 mg/dL 18  17  17    Creatinine 0.44 - 1.00 mg/dL 9.42  9.33  9.29   Sodium 135 - 145 mmol/L 134  135  131   Potassium 3.5 - 5.1 mmol/L 4.0  4.1  4.0   Chloride 98 - 111 mmol/L 100  98  97   CO2 22 - 32 mmol/L 26  31  26    Calcium  8.9 - 10.3 mg/dL 9.3  9.8  9.3   Total Protein 6.5 - 8.1 g/dL  7.3    Total Bilirubin 0.0 - 1.2 mg/dL  0.6    Alkaline Phos 38 - 126 U/L  96    AST 15 - 41 U/L  17    ALT 0 - 44 U/L  15      Lab Results  Component Value Date   WBC 6.6 06/12/2024   HGB 12.7 06/12/2024   HCT 37.5 06/12/2024   MCV 89.7 06/12/2024   PLT 323 06/12/2024   NEUTROABS 4.1 06/12/2024    ASSESSMENT & PLAN:  Malignant neoplasm of upper-inner quadrant of right breast in female, estrogen receptor positive (HCC) 10/10/2018: Right lumpectomy: IDC grade 1, 1.2 cm, with intermediate grade DCIS, margins negative, 0/2 lymph nodes negative, T1CN0 stage Ia ER 100%, PR 90%, Ki-67 10%, HER-2 1+ negative  Oncotype DX: 29: 18% risk of recurrence    Treatment plan:  1. Recommended systemic adjuvant chemotherapy with Taxotere and Cytoxan every 3 weeks x4 cycles (patient refused)  2. Adjuvant radiation therapy 11/08/2018-12/05/2018  3. Adjuvant antiestrogen therapy with anastrozole  started 12/05/2018 switched to letrozole  10/09/19    Letrozole  toxicities: Denies any adverse effects to letrozole    Breast cancer surveillance: 1.  Mammogram 02/22/2024: Benign, breast density category D: Because of high breast density recommended contrast-enhanced mammograms for annual surveillance 2. bone density 11/08/2023: T score -2.6: Osteoporosis 3.  Breast exam 06/12/2024: Benign   Osteoporosis discussion: We recommended holding today's Prolia  injection because she is having some dental issues and has a need to undergo either dental extraction as well as implant placement. We will obtain a bone density in December to see how she is  doing from the Prolia  injection that she received previously. We are likely to reinitiate her bisphosphonate therapy when she  comes back in December after the bone density test.  Return to clinic every 6 months for Prolia  and follow-up with me  Assessment & Plan Osteoporosis On Prolia , concerned about osteonecrosis of the jaw due to dental issues. Risk increases slightly with prolonged use. Considering dental procedures. - Hold Prolia  injection for dental procedures. - Bone density test in December to assess previous Prolia  effects. - Reevaluate bisphosphonate therapy post-bone density test.      No orders of the defined types were placed in this encounter.  The patient has a good understanding of the overall plan. she agrees with it. she will call with any problems that may develop before the next visit here. Total time spent: 30 mins including face to face time and time spent for planning, charting and co-ordination of care   Naomi MARLA Chad, MD 06/12/24

## 2024-06-14 ENCOUNTER — Emergency Department
Admission: EM | Admit: 2024-06-14 | Discharge: 2024-06-14 | Disposition: A | Discharge status: Home / Self Care | Attending: Emergency Medicine | Admitting: Emergency Medicine

## 2024-06-14 DIAGNOSIS — I10 Essential (primary) hypertension: Secondary | ICD-10-CM | POA: Diagnosis not present

## 2024-06-14 DIAGNOSIS — G43909 Migraine, unspecified, not intractable, without status migrainosus: Secondary | ICD-10-CM | POA: Diagnosis not present

## 2024-06-14 DIAGNOSIS — R519 Headache, unspecified: Secondary | ICD-10-CM | POA: Diagnosis present

## 2024-06-14 MED ORDER — SODIUM CHLORIDE 0.9 % IV BOLUS
500.0000 mL | Freq: Once | INTRAVENOUS | Status: AC
  Administered 2024-06-14: 500 mL via INTRAVENOUS

## 2024-06-14 MED ORDER — KETOROLAC TROMETHAMINE 30 MG/ML IJ SOLN
30.0000 mg | Freq: Once | INTRAMUSCULAR | Status: AC
  Administered 2024-06-14: 30 mg via INTRAVENOUS
  Filled 2024-06-14: qty 1

## 2024-06-14 MED ORDER — ONDANSETRON HCL 4 MG/2ML IJ SOLN
4.0000 mg | Freq: Once | INTRAMUSCULAR | Status: AC
  Administered 2024-06-14: 4 mg via INTRAVENOUS
  Filled 2024-06-14: qty 2

## 2024-06-14 MED ORDER — DEXAMETHASONE SODIUM PHOSPHATE 10 MG/ML IJ SOLN
10.0000 mg | Freq: Once | INTRAMUSCULAR | Status: AC
  Administered 2024-06-14: 10 mg via INTRAVENOUS
  Filled 2024-06-14: qty 1

## 2024-06-14 NOTE — ED Provider Notes (Signed)
   [facility] Provider Note    Event Date/Time   First MD Initiated Contact with Patient 06/14/24 1111     (approximate)  History   Chief Complaint: Migraine  HPI  Jennifer [name] is a 68 y.o. female with a past medical history of anxiety, hypertension, hyperlipidemia, migraine, presents to the emergency department for migraine headache.  According to the patient for the past 3 days she has been suffering a migraine and feels consistent with her typical migraine.  Patient states she typically receives Toradol , Zofran , Decadron  and 500 cc of IV fluids.  States this is always worked for her in the past.  Patient denies any red flags.  Physical Exam   Triage Vital Signs: ED Triage Vitals  Encounter Vitals Group     BP 06/14/24 1006 (!) 128/97     Girls Systolic BP Percentile --      Girls Diastolic BP Percentile --      Boys Systolic BP Percentile --      Boys Diastolic BP Percentile --      Pulse Rate 06/14/24 1006 72     Resp 06/14/24 1006 16     Temp 06/14/24 1006 97.8 F (36.6 C)     Temp Source 06/14/24 1006 Oral     SpO2 06/14/24 1006 100 %     Weight 06/14/24 1005 123 lb (55.8 kg)     Height 06/14/24 1005 5' 1 (1.549 m)     Head Circumference --      Peak Flow --      Pain Score 06/14/24 1014 3     Pain Loc --      Pain Education --      Exclude from Growth Chart --     Most recent vital signs: Vitals:   06/14/24 1006  BP: (!) 128/97  Pulse: 72  Resp: 16  Temp: 97.8 F (36.6 C)  SpO2: 100%    General: Awake, no distress.  CV:  Good peripheral perfusion.  Regular rate and rhythm  Resp:  Normal effort.  Equal breath sounds bilaterally.  Abd:  No distention   ED Results / Procedures / Treatments   MEDICATIONS ORDERED IN ED: Medications  ketorolac  (TORADOL ) 30 MG/ML injection 30 mg (has no administration in time range)  ondansetron  (ZOFRAN ) injection 4 mg (has no administration in time range)  sodium chloride  0.9 % bolus 500 mL  (has no administration in time range)  dexamethasone  (DECADRON ) injection 10 mg (has no administration in time range)     IMPRESSION / MDM / ASSESSMENT AND PLAN / ED COURSE  I reviewed the triage vital signs and the nursing notes.  Patient's presentation is most consistent with acute illness / injury with system symptoms.  Patient presents to the emergency department for migraine headache symptoms that have been ongoing x 3 days.  Patient states a history of similar symptoms in the past.  States Toradol /Decadron /Zofran /fluids has always worked for her.  Does not want any additional or different medications only these 3.  Patient denies any red flags throughout my questioning.  Will dose the patient's typical migraine cocktail of medications IV fluids.  As long as the patient is feeling improvement we will discharge home.  FINAL CLINICAL IMPRESSION(S) / ED DIAGNOSES   Migraine headache   Note:  This document was prepared using Dragon voice recognition software and may include unintentional dictation errors.   Dorothyann Drivers, MD 06/14/24 1146

## 2024-06-14 NOTE — ED Triage Notes (Addendum)
 Pt c/o increasing migraine x3 days.  Pt reports she has been treating it w/ naratriptan  w/o relief.  Pt reports taking a leftover rizatriptan  yesterday w/o relief.  Pain score 3/10.  Pt reports it feels like my head is swollen.  Pt reports being seen several times for same. Pt requesting migraine cocktail (zofran , toradol , and decadron ).

## 2024-06-14 NOTE — Discharge Instructions (Signed)
 Please follow-up with your doctor within the next Avril days regarding today's ED visit.  Please obtain plenty of sleep and rest.  Drink plenty of fluids.  Return to the emergency department for any return of/worsening headache, or any other symptom personally concerning to yourself.

## 2024-06-15 ENCOUNTER — Ambulatory Visit: Admitting: Neurology

## 2024-06-20 ENCOUNTER — Ambulatory Visit (INDEPENDENT_AMBULATORY_CARE_PROVIDER_SITE_OTHER): Admitting: Neurology

## 2024-06-20 VITALS — BP 118/82

## 2024-06-20 DIAGNOSIS — G43711 Chronic migraine without aura, intractable, with status migrainosus: Secondary | ICD-10-CM

## 2024-06-20 DIAGNOSIS — G43009 Migraine without aura, not intractable, without status migrainosus: Secondary | ICD-10-CM

## 2024-06-20 MED ORDER — ONABOTULINUMTOXINA 200 UNITS IJ SOLR
200.0000 [IU] | Freq: Once | INTRAMUSCULAR | Status: AC
  Administered 2024-06-20: 200 [IU] via INTRAMUSCULAR

## 2024-06-20 NOTE — Progress Notes (Signed)
   BOTOX  PROCEDURE NOTE FOR MIGRAINE HEADACHE  HISTORY: Jennifer [name] is here for Botox . Last was 03/16/24 with me. On Vyepti  1st 100 mg was in Feb, was having 10 migraines a month, 04/12/24 had 300 mg dose, in June only had 7 migraines, only 5 in July. Did try a few of her triggers and was okay, but eating PF chang caused migraine, went to the ER 06/14/24.  Uses Amerge with very good benefit. The week before her Botox , has more migraines.    Description of procedure:  The patient was placed in a sitting position. The standard protocol was used for Botox  as follows, with 5 units of Botox  injected at each site:   -Procerus muscle, midline injection  -Corrugator muscle, bilateral injection  -Frontalis muscle, bilateral injection, with 2 sites each side, medial injection was performed in the upper one third of the frontalis muscle, in the region vertical from the medial inferior edge of the superior orbital rim. The lateral injection was again in the upper one third of the forehead vertically above the lateral limbus of the cornea, 1.5 cm lateral to the medial injection site.  -Temporalis muscle injection, 4 sites, bilaterally. The first injection was 3 cm above the tragus of the ear, second injection site was 1.5 cm to 3 cm up from the first injection site in line with the tragus of the ear. The third injection site was 1.5-3 cm forward between the first 2 injection sites. The fourth injection site was 1.5 cm posterior to the second injection site.  -Occipitalis muscle injection, 3 sites, bilaterally. The first injection was done one half way between the occipital protuberance and the tip of the mastoid process behind the ear. The second injection site was done lateral and superior to the first, 1 fingerbreadth from the first injection. The third injection site was 1 fingerbreadth superiorly and medially from the first injection site.  -Cervical paraspinal muscle injection, 2 sites, bilateral, the  first injection site was 1 cm from the midline of the cervical spine, 3 cm inferior to the lower border of the occipital protuberance. The second injection site was 1.5 cm superiorly and laterally to the first injection site.  -Trapezius muscle injection was performed at 3 sites, bilaterally. The first injection site was in the upper trapezius muscle halfway between the inflection point of the neck, and the acromion. The second injection site was one half way between the acromion and the first injection site. The third injection was done between the first injection site and the inflection point of the neck.   A 200 unit bottle of Botox  was used, 155 units were injected, the rest of the Botox  was wasted. The patient tolerated the procedure well, there were no complications of the above procedure.  Botox  NDC 9976-6078-97 Lot number [id number] Expiration date 08/2026 BB

## 2024-06-20 NOTE — Progress Notes (Signed)
 Botox - 200 units x 1 vial Lot: [id number] Expiration: 08/2026 NDC: 9976-6078-97  Bacteriostatic 0.9% Sodium Chloride - 4 mL  Lot: [id number] Expiration: 2025/09/22 NDC: 9590803397  Dx: G43.711  B/B Witnessed by a joshua peak

## 2024-06-30 DIAGNOSIS — E039 Hypothyroidism, unspecified: Secondary | ICD-10-CM | POA: Diagnosis not present

## 2024-06-30 DIAGNOSIS — Z853 Personal history of malignant neoplasm of breast: Secondary | ICD-10-CM | POA: Diagnosis not present

## 2024-06-30 DIAGNOSIS — G43909 Migraine, unspecified, not intractable, without status migrainosus: Secondary | ICD-10-CM | POA: Diagnosis not present

## 2024-07-05 ENCOUNTER — Other Ambulatory Visit: Payer: Self-pay | Admitting: Gastroenterology

## 2024-07-07 DIAGNOSIS — R21 Rash and other nonspecific skin eruption: Secondary | ICD-10-CM | POA: Diagnosis not present

## 2024-07-07 DIAGNOSIS — L2989 Other pruritus: Secondary | ICD-10-CM | POA: Diagnosis not present

## 2024-07-07 DIAGNOSIS — J301 Allergic rhinitis due to pollen: Secondary | ICD-10-CM | POA: Diagnosis not present

## 2024-07-12 ENCOUNTER — Ambulatory Visit: Payer: Self-pay | Admitting: Family Medicine

## 2024-07-12 ENCOUNTER — Encounter: Payer: Self-pay | Admitting: Family Medicine

## 2024-07-12 ENCOUNTER — Ambulatory Visit (INDEPENDENT_AMBULATORY_CARE_PROVIDER_SITE_OTHER): Admitting: Family Medicine

## 2024-07-12 VITALS — BP 118/68 | HR 70 | Temp 97.5°F | Ht 61.0 in | Wt 119.0 lb

## 2024-07-12 DIAGNOSIS — E039 Hypothyroidism, unspecified: Secondary | ICD-10-CM

## 2024-07-12 DIAGNOSIS — G43109 Migraine with aura, not intractable, without status migrainosus: Secondary | ICD-10-CM

## 2024-07-12 DIAGNOSIS — R7303 Prediabetes: Secondary | ICD-10-CM

## 2024-07-12 DIAGNOSIS — R634 Abnormal weight loss: Secondary | ICD-10-CM | POA: Insufficient documentation

## 2024-07-12 LAB — TSH: TSH: 1.3 u[IU]/mL (ref 0.35–5.50)

## 2024-07-12 LAB — HEMOGLOBIN A1C: Hgb A1c MFr Bld: 6 % (ref 4.6–6.5)

## 2024-07-12 NOTE — Assessment & Plan Note (Signed)
 Ongoing Recent ER visit out of town Reviewed hospital records, lab results and studies in detail   Improved today  Continues neuro follow up  Vyepti  infusion  Amerge  Botox 

## 2024-07-12 NOTE — Assessment & Plan Note (Signed)
 TSH today in setting of weight loss  Taking levothyroxine  75 mcg daily

## 2024-07-12 NOTE — Assessment & Plan Note (Signed)
 A1c today in setting of weight loss

## 2024-07-12 NOTE — Assessment & Plan Note (Signed)
 From decrease po intake/originally intentional with some intermittent fasting Pt was concerned about rate of loss -13 lb in 2.5 mo  Reassuring exam  Lab for TSH and A1c today  Encouraged pt add back more calories/especially protein  Swap cardio for strength training for goal of more lean muscle mass Continue to monitor  Would recommend she mt weight instead of losing more

## 2024-07-12 NOTE — Patient Instructions (Addendum)
 Stay active  Less focus on cardio  More on strength training Add some strength training to your routine, this is important for bone and brain health and can reduce your risk of falls and help your body use insulin properly and regulate weight  Light weights, exercise bands , and internet videos are a good way to start  Yoga (chair or regular), machines , floor exercises or a gym with machines are also good options    Increase calories/especially protein   Lab today for tsh and A1c   Continue neurologic follow up

## 2024-07-12 NOTE — Progress Notes (Signed)
 Subjective:    Patient ID: Jennifer [name], female    DOB: [birth year], 68 y.o.   MRN: [id number]  HPI  Wt Readings from Last 3 Encounters:  07/12/24 119 lb (54 kg)  06/14/24 123 lb (55.8 kg)  06/12/24 123 lb 3.2 oz (55.9 kg)   22.48 kg/m  Vitals:   07/12/24 1000  BP: 118/68  Pulse: 70  Temp: (!) 97.5 F (36.4 C)  SpO2: 98%    Pt presents with c/o  Weight loss   Has recently lost weight  About 13 lb in 2.5 months  Was trying to loose weight   In May- very busy and had family reunion Very physically active and no time to eat Noted some weight loss - was happy with that and kept it up  Then lost more than she expected   Watching what she ate  Cut sweets  Exercise  Treadmill  Used to body build when younger      Intermittent fasting  First food is 11  Eats a meal then  Snack nuts/fruit at work- 2 pm  Dinner - protein / [place] / not a big meal   Thinks she needs more protein and healthy carbs   Denies body dysmorphia In the past goal weight is 115 lb (that is where she feels good)   Appetite is fair  Does still get hungry          05/30/2024    9:09 AM 10/01/2023   11:03 AM 07/06/2023    2:11 PM 01/21/2023    4:00 PM 06/17/2021    2:51 PM  Depression screen PHQ 2/9  Decreased Interest 0 0 0 0 0  Down, Depressed, Hopeless 0 0 0 0 0  PHQ - 2 Score 0 0 0 0 0  Altered sleeping 0 0  0 0  Tired, decreased energy 0 0  0 1  Change in appetite 0 0  0 0  Feeling bad or failure about yourself  0 0  0 0  Trouble concentrating 0 0  0 0  Moving slowly or fidgety/restless 0 0  0 0  Suicidal thoughts 0 0  0 0  PHQ-9 Score 0 0  0 1  Difficult doing work/chores Not difficult at all Not difficult at all  Not difficult at all       05/30/2024    9:09 AM 10/01/2023   11:03 AM 01/21/2023    4:00 PM  GAD 7 : Generalized Anxiety Score  Nervous, Anxious, on Edge 0 0 0  Control/stop worrying 0 0 0  Worry too much - different things 0 1 0  Trouble relaxing 0 1 0   Restless 0 1 0  Easily annoyed or irritable 0 0 0  Afraid - awful might happen 0 0 0  Total GAD 7 Score 0 3 0  Anxiety Difficulty Not difficult at all Not difficult at all Not difficult at all        History of breast cancer in 2019  Takes letrozole   Mammogram 02/2024   Hypothyroid Lab Results  Component Value Date   TSH 1.25 09/17/2023   Levothyroxine  75 mcg daily  Seen in ER out of town for migraine (in [facility]) on 8/8  Given Dexamethazone injection Ketorolac  Zofran   Fluids   Migrianes have been bad  Improved in June/ a little worse in July  Getting vyepti  infusion -this will be her 2nd 300 mg infusion  Tryptan prn   Lab Results  Component  Value Date   HGBA1C 5.7 09/17/2023   HGBA1C 6.0 08/25/2022   HGBA1C 6.5 06/13/2021   Some increase in stress Caring for mother  Other family obligations     Patient Active Problem List   Diagnosis Date Noted   Weight loss 07/12/2024   Left knee pain 05/30/2024   Cerumen impaction 11/10/2023   Current use of proton pump inhibitor 09/12/2023   Chronic migraine without aura, with intractable migraine, so stated, with status migrainosus 07/29/2023   Migraine without aura and without status migrainosus, not intractable 07/29/2023   Thoracic back pain 01/21/2023   Pain in right shoulder 08/12/2022   Raynaud's disease 03/17/2022   Iron  deficiency anemia 09/08/2020   Common bile duct dilatation 09/06/2020   History of breast cancer 03/01/2020   Genetic testing 11/14/2019   Family history of prostate cancer    Family history of breast cancer    Malignant neoplasm of upper-inner quadrant of right breast in female, estrogen receptor positive (HCC) 09/20/2018   B12 deficiency 04/13/2018   Joint pain 04/11/2018   Colon cancer screening 09/16/2017   Prediabetes 07/28/2016   Allergic rhinitis 11/13/2014   Hyperlipidemia, mild 09/26/2014   Routine general medical examination at a health care facility 10/28/2012   GERD 09/16/2010    Hypothyroidism 05/07/2008   Osteoporosis 05/07/2008   Migraine with aura 11/11/2007   Past Medical History:  Diagnosis Date   Allergic rhinitis    Allergy    seasonal   Anemia    Anxiety    Arthritis    hands, knees   Asthma    as a child   Blood transfusion without reported diagnosis    as a child   Breast cancer (HCC) 2019   Right Breast Cancer   Cancer (HCC) 09/2018   Breast CA new DX, right breast   Cervical dysplasia    Common bile duct dilation    Diabetes mellitus without complication (HCC)    Endometriosis    Esophageal reflux    Family history of adverse reaction to anesthesia    younger sister anxiety for a dew days after   Family history of breast cancer    Family history of breast cancer    Family history of prostate cancer    Hepatic cyst 08/2020   multiple   Hepatic hemangioma 08/2020   intrahepatic hemangioma   Hiatal hernia    HSV-1 (herpes simplex virus 1) infection    Hyperlipidemia    Hypertension    pt.denies 10/19/22   Hypothyroid    Migraine with aura, without mention of intractable migraine without mention of status migrainosus    Osteopenia 08/2019   T score -2.2 FRAX 11% / 1.7%   Osteoporosis 05/07/2008   Qualifier: Diagnosis of  By: Randeen MD, Laine Caldron    Personal history of radiation therapy 2019   Right Breast Cancer   Pre-diabetes    Raynaud's disease    Past Surgical History:  Procedure Laterality Date   ABDOMINAL HYSTERECTOMY  2021   BIOPSY  09/12/2020   Procedure: BIOPSY;  Surgeon: Teressa Toribio SQUIBB, MD;  Location: [facility];  Service: Endoscopy;;   BREAST BIOPSY Left 11/27/2022   MM LT BREAST BX W LOC DEV 1ST LESION IMAGE BX SPEC STEREO GUIDE 11/27/2022 [facility]   BREAST EXCISIONAL BIOPSY Bilateral over 10 years ago   benign x 2    BREAST LUMPECTOMY Right 10/10/2018   BREAST LUMPECTOMY WITH RADIOACTIVE SEED AND SENTINEL LYMPH NODE BIOPSY Right 10/10/2018  Procedure: RIGHT BREAST LUMPECTOMY WITH  RADIOACTIVE SEED AND RIGHT SENTINEL LYMPH NODE BIOPSY;  Surgeon: Curvin Mt III, MD;  Location: [facility];  Service: General;  Laterality: Right;   BREAST SURGERY     Benign breast lump excised   CERVICAL CONE BIOPSY  1985   severe dysplasia   COLONOSCOPY  2005 and dec 2019   normal   COLPOSCOPY     endoscopy  10/2018   ESOPHAGOGASTRODUODENOSCOPY (EGD) WITH PROPOFOL  N/A 09/12/2020   Procedure: ESOPHAGOGASTRODUODENOSCOPY (EGD) WITH PROPOFOL ;  Surgeon: Teressa Toribio SQUIBB, MD;  Location: [facility];  Service: Endoscopy;  Laterality: N/A;   EUS N/A 09/12/2020   Procedure: UPPER ENDOSCOPIC ULTRASOUND (EUS) RADIAL;  Surgeon: Teressa Toribio SQUIBB, MD;  Location: [facility];  Service: Endoscopy;  Laterality: N/A;   LAPAROSCOPIC BILATERAL SALPINGO OOPHERECTOMY Bilateral 10/27/2019   Procedure: LAPAROSCOPIC BILATERAL SALPINGO OOPHORECTOMY WITH PERITONEAL WASHINGS;  Surgeon: Lavoie, Marie-Lyne, MD;  Location: [facility];  Service: Gynecology;  Laterality: Bilateral;  request 1:00pm on Friday, Dec. 4th in [place] Iqueue time held requests one hour OR time   moles removed from upper back, face lft, 1 between breasts, upper leg left inside  10/18/2019   wearing small round bandaids on for 2 weeks   ROTATOR CUFF REPAIR Bilateral 08/2008   UPPER GASTROINTESTINAL ENDOSCOPY     Social History   Tobacco Use   Smoking status: Never    Passive exposure: Never   Smokeless tobacco: Never  Vaping Use   Vaping status: Never Used  Substance Use Topics   Alcohol use: No   Drug use: No   Family History  Problem Relation Age of Onset   Breast cancer Mother 74   Diabetes Mother    Irritable bowel syndrome Mother    Cancer Mother    Hearing loss Mother    Hyperlipidemia Mother    Miscarriages / Stillbirths Mother    Hypertension Mother    Hypertension Father    Heart disease Father    Hypertension Sister    Anuerysm Sister        head   Depression Sister     Irritable bowel syndrome Sister    Hypertension Sister    Anxiety disorder Sister    Other Sister        prediabetes   Celiac disease Brother    Diabetes Brother    Osteoporosis Maternal Grandmother    Diabetes Maternal Grandmother    Arthritis Maternal Grandmother    Heart disease Maternal Grandmother    COPD Paternal Grandmother    Heart disease Paternal Grandfather    Breast cancer Cousin 59       pat first cousin   Breast cancer Cousin        mat second cousin with breast cancer in her 30s   Thyroid  cancer Cousin 20       pat first cousin   Prostate cancer Other 65   Colon cancer Neg Hx    Stomach cancer Neg Hx    Esophageal cancer Neg Hx    Pancreatic cancer Neg Hx    Rectal cancer Neg Hx    Colon polyps Neg Hx    Crohn's disease Neg Hx    Ulcerative colitis Neg Hx    Allergies  Allergen Reactions   Ciprofloxacin Other (See Comments)    Dizziness    Codeine Phosphate Other (See Comments)    Dizziness, heart palpitations, nervous   Decongestant [Pseudoephedrine] Other (See Comments)  dizziness   Diclofenac    Diphenhydramine Hcl (Sleep)    Flonase  [Fluticasone  Propionate] Other (See Comments)    Worsens her migraine    Fluticasone     Hydrocodone  Other (See Comments)    Made nervous--10/09 surgery rotator cuff   Macrobid  [Nitrofurantoin ]     headaches   Nasal Spray    Nasonex  [Mometasone ]     Migraines   Reglan [Metoclopramide] Other (See Comments)    Dizziness   Topamax [Topiramate] Other (See Comments)    Dizziness    Benadryl [Diphenhydramine Hcl] Palpitations   Sulfa Antibiotics Anxiety   Current Outpatient Medications on File Prior to Visit  Medication Sig Dispense Refill   ALPRAZolam  (XANAX ) 0.5 MG tablet TAKE 1/2 TO 1 TABLET BY MOUTH ONCE DAILY AS NEEDED FOR SLEEP/ANXIETY 15 tablet 2   atenolol  (TENORMIN ) 25 MG tablet Take 12.5 mg by mouth 3 (three) times daily. For migraine prevention     B Complex Vitamins (B COMPLEX PO) Take by mouth.      baclofen  (LIORESAL ) 10 MG tablet Take 1 tablet (10 mg total) by mouth 3 (three) times daily. 30 each 0   CALCIUM  CARBONATE-VITAMIN D  PO Take 1 tablet by mouth daily. 1000 mg     Coenzyme Q10 (CO Q 10 PO) Take 1 capsule by mouth daily.     Eptinezumab -jjmr (VYEPTI  IV) Inject into the vein every 3 (three) months.     famotidine  (PEPCID ) 40 MG tablet Take 40 mg by mouth 2 (two) times daily.      hydrocortisone  2.5 % cream Apply to affected areas BID PRN as directed. 30 g 3   hydrOXYzine  (ATARAX ) 25 MG tablet Take 37.5 mg by mouth at bedtime as needed.     ketorolac  (TORADOL ) 10 MG tablet Take by mouth.     letrozole  (FEMARA ) 2.5 MG tablet Take 1 tablet (2.5 mg total) by mouth daily. 90 tablet 3   levocetirizine (XYZAL) 5 MG tablet Take 5 mg by mouth at bedtime.   3   levothyroxine  (SYNTHROID ) 75 MCG tablet TAKE 1 TABLET BY MOUTH EVERY DAY BEFORE BREAKFAST 90 tablet 2   Magnesium  Oxide 400 (240 Mg) MG TABS Take 400 mg by mouth daily.      metFORMIN  (GLUCOPHAGE -XR) 500 MG 24 hr tablet Take 1 tablet (500 mg total) by mouth daily with breakfast. 90 tablet 3   Multiple Vitamin (MULTIVITAMIN WITH MINERALS) TABS tablet Take 1 tablet by mouth daily.     naratriptan  (AMERGE) 2.5 MG tablet TAKE 1 TABLET (2.5 MG TOTAL) BY MOUTH AS NEEDED FOR MIGRAINE. TAKE ONE (1) TABLET AT ONSET OF HEADACHE IF RETURNS OR DOES NOT RESOLVE, MAY REPEAT AFTER 4 HOURS DO NOT EXCEED FIVE (5) MG IN 24 HOURS. 9 tablet 1   neomycin-polymyxin b-dexamethasone  (MAXITROL) 3.5-10000-0.1 SUSP 1 drop 3 (three) times daily.     Omega-3 Fatty Acids (FISH OIL) 1000 MG CAPS Take 1,000 mg by mouth daily.      OnabotulinumtoxinA  (BOTOX  IJ) Inject 1 Dose as directed every 6 (six) weeks.      ondansetron  (ZOFRAN  ODT) 4 MG disintegrating tablet Take 1 tablet (4 mg total) by mouth every 8 (eight) hours as needed. 20 tablet 0   pantoprazole  (PROTONIX ) 20 MG tablet TAKE 1 TABLET BY MOUTH EVERY DAY 90 tablet 1   PRESCRIPTION MEDICATION Inject into the  skin every 6 (six) weeks. Steroid Trigger     Probiotic Product (PROBIOTIC-10 PO) Take 1 capsule by mouth daily.  promethazine  (PHENERGAN ) 25 MG tablet Take 25 mg by mouth daily as needed.     rizatriptan  (MAXALT ) 10 MG tablet Take 1 tablet (10 mg total) by mouth once as needed for up to 10 days for migraine. May repeat in 2 hours if needed 10 tablet 0   triamcinolone  cream (KENALOG ) 0.1 % Apply to affected areas BID PRN for up to two weeks at a time as directed. 30 g 1   TURMERIC PO Take 1 tablet by mouth daily.     VITAMIN D  PO Take 1 capsule by mouth daily.     denosumab  (PROLIA ) 60 MG/ML SOSY injection Inject 60 mg into the skin every 6 (six) months. (Patient not taking: Reported on 07/12/2024)     No current facility-administered medications on file prior to visit.    Review of Systems  Constitutional:  Positive for appetite change. Negative for activity change, fatigue, fever and unexpected weight change.       Weight loss from reduced calories   HENT:  Negative for congestion, ear pain, rhinorrhea, sinus pressure and sore throat.   Eyes:  Negative for pain, redness and visual disturbance.  Respiratory:  Negative for cough, shortness of breath and wheezing.   Cardiovascular:  Negative for chest pain and palpitations.  Gastrointestinal:  Negative for abdominal pain, blood in stool, constipation and diarrhea.  Endocrine: Negative for polydipsia and polyuria.  Genitourinary:  Negative for dysuria, frequency and urgency.  Musculoskeletal:  Negative for arthralgias, back pain and myalgias.  Skin:  Negative for pallor and rash.  Allergic/Immunologic: Negative for environmental allergies.  Neurological:  Positive for headaches. Negative for dizziness and syncope.  Hematological:  Negative for adenopathy. Does not bruise/bleed easily.  Psychiatric/Behavioral:  Negative for decreased concentration and dysphoric mood. The patient is not nervous/anxious.        Increased stressors lately        Objective:   Physical Exam Constitutional:      General: She is not in acute distress.    Appearance: She is well-developed and normal weight. She is not ill-appearing or diaphoretic.  HENT:     Head: Normocephalic and atraumatic.     Mouth/Throat:     Mouth: Mucous membranes are moist.  Eyes:     General: No scleral icterus.       Right eye: No discharge.        Left eye: No discharge.     Conjunctiva/sclera: Conjunctivae normal.     Pupils: Pupils are equal, round, and reactive to light.  Neck:     Thyroid : No thyromegaly.     Vascular: No carotid bruit or JVD.  Cardiovascular:     Rate and Rhythm: Normal rate and regular rhythm.     Heart sounds: Normal heart sounds.     No gallop.  Pulmonary:     Effort: Pulmonary effort is normal. No respiratory distress.     Breath sounds: Normal breath sounds. No stridor. No wheezing, rhonchi or rales.  Abdominal:     General: There is no distension or abdominal bruit.     Palpations: Abdomen is soft. There is no mass.     Tenderness: There is no abdominal tenderness.  Musculoskeletal:     Cervical back: Normal range of motion and neck supple.     Right lower leg: No edema.     Left lower leg: No edema.  Lymphadenopathy:     Cervical: No cervical adenopathy.  Skin:    General: Skin is warm  and dry.     Coloration: Skin is not pale.     Findings: No rash.  Neurological:     Mental Status: She is alert.     Coordination: Coordination normal.     Deep Tendon Reflexes: Reflexes are normal and symmetric. Reflexes normal.  Psychiatric:        Mood and Affect: Mood normal.           Assessment & Plan:   Problem List Items Addressed This Visit       Cardiovascular and Mediastinum   Migraine with aura   Ongoing Recent ER visit out of town Reviewed hospital records, lab results and studies in detail   Improved today  Continues neuro follow up  Vyepti  infusion  Amerge  Botox          Endocrine    Hypothyroidism   TSH today in setting of weight loss  Taking levothyroxine  75 mcg daily      Relevant Orders   TSH     Other   Weight loss - Primary   From decrease po intake/originally intentional with some intermittent fasting Pt was concerned about rate of loss -13 lb in 2.5 mo  Reassuring exam  Lab for TSH and A1c today  Encouraged pt add back more calories/especially protein  Swap cardio for strength training for goal of more lean muscle mass Continue to monitor  Would recommend she mt weight instead of losing more        Prediabetes   A1c today in setting of weight loss       Relevant Orders   Hemoglobin A1c

## 2024-07-13 ENCOUNTER — Other Ambulatory Visit: Payer: Self-pay | Admitting: Neurology

## 2024-07-13 DIAGNOSIS — G43711 Chronic migraine without aura, intractable, with status migrainosus: Secondary | ICD-10-CM | POA: Diagnosis not present

## 2024-07-16 ENCOUNTER — Emergency Department: Admission: EM | Admit: 2024-07-16 | Discharge: 2024-07-16 | Disposition: A | Discharge status: Home / Self Care

## 2024-07-16 ENCOUNTER — Emergency Department

## 2024-07-16 ENCOUNTER — Other Ambulatory Visit: Payer: Self-pay

## 2024-07-16 DIAGNOSIS — G43701 Chronic migraine without aura, not intractable, with status migrainosus: Secondary | ICD-10-CM | POA: Diagnosis not present

## 2024-07-16 DIAGNOSIS — R519 Headache, unspecified: Secondary | ICD-10-CM | POA: Diagnosis not present

## 2024-07-16 DIAGNOSIS — G43709 Chronic migraine without aura, not intractable, without status migrainosus: Secondary | ICD-10-CM | POA: Diagnosis not present

## 2024-07-16 LAB — COMPREHENSIVE METABOLIC PANEL WITH GFR
ALT: 17 U/L (ref 0–44)
AST: 20 U/L (ref 15–41)
Albumin: 4.1 g/dL (ref 3.5–5.0)
Alkaline Phosphatase: 59 U/L (ref 38–126)
Anion gap: 10 (ref 5–15)
BUN: 16 mg/dL (ref 8–23)
CO2: 25 mmol/L (ref 22–32)
Calcium: 9.3 mg/dL (ref 8.9–10.3)
Chloride: 104 mmol/L (ref 98–111)
Creatinine, Ser: 0.54 mg/dL (ref 0.44–1.00)
GFR, Estimated: >60 mL/min (ref >60)
Glucose, Bld: 87 mg/dL (ref 70–99)
Potassium: 3.7 mmol/L (ref 3.5–5.1)
Sodium: 139 mmol/L (ref 135–145)
Total Bilirubin: 0.7 mg/dL (ref 0.0–1.2)
Total Protein: 7.5 g/dL (ref 6.5–8.1)

## 2024-07-16 LAB — CBC WITH DIFFERENTIAL/PLATELET
Abs Immature Granulocytes: 0.01 K/uL (ref 0.00–0.07)
Basophils Absolute: 0.1 K/uL (ref 0.0–0.1)
Basophils Relative: 1 %
Eosinophils Absolute: 0.1 K/uL (ref 0.0–0.5)
Eosinophils Relative: 2 %
HCT: 39.7 % (ref 36.0–46.0)
Hemoglobin: 13.4 g/dL (ref 12.0–15.0)
Immature Granulocytes: 0 %
Lymphocytes Relative: 25 %
Lymphs Abs: 1.5 K/uL (ref 0.7–4.0)
MCH: 30.3 pg (ref 26.0–34.0)
MCHC: 33.8 g/dL (ref 30.0–36.0)
MCV: 89.8 fL (ref 80.0–100.0)
Monocytes Absolute: 0.5 K/uL (ref 0.1–1.0)
Monocytes Relative: 9 %
Neutro Abs: 3.7 K/uL (ref 1.7–7.7)
Neutrophils Relative %: 63 %
Platelets: 281 K/uL (ref 150–400)
RBC: 4.42 MIL/uL (ref 3.87–5.11)
RDW: 12.2 % (ref 11.5–15.5)
WBC: 5.8 K/uL (ref 4.0–10.5)
nRBC: 0 % (ref 0.0–0.2)

## 2024-07-16 MED ORDER — SODIUM CHLORIDE 0.9 % IV BOLUS
1000.0000 mL | Freq: Once | INTRAVENOUS | Status: AC
  Administered 2024-07-16: 1000 mL via INTRAVENOUS

## 2024-07-16 MED ORDER — KETOROLAC TROMETHAMINE 15 MG/ML IJ SOLN
30.0000 mg | Freq: Once | INTRAMUSCULAR | Status: AC
  Administered 2024-07-16: 30 mg via INTRAVENOUS
  Filled 2024-07-16: qty 2

## 2024-07-16 MED ORDER — DEXAMETHASONE SODIUM PHOSPHATE 10 MG/ML IJ SOLN
10.0000 mg | Freq: Once | INTRAMUSCULAR | Status: AC
  Administered 2024-07-16: 10 mg via INTRAVENOUS
  Filled 2024-07-16: qty 1

## 2024-07-16 MED ORDER — ONDANSETRON HCL 4 MG/2ML IJ SOLN
4.0000 mg | Freq: Once | INTRAMUSCULAR | Status: AC
  Administered 2024-07-16: 4 mg via INTRAVENOUS
  Filled 2024-07-16: qty 2

## 2024-07-16 NOTE — ED Provider Notes (Signed)
 [facility] Provider Note    Event Date/Time   First MD Initiated Contact with Patient 07/16/24 1018     (approximate)   History   Migraine   HPI  Jennifer [name] is a 68 y.o. female with PMH of migraines who presents for evaluation of a migraine. Patient states she has had several this month. This migraine began on Wednesday. She has a triptan that she takes, which she took last night around 8pm and normally has relief by this point. She also receives infusions of Vyepti  every three months ofr migrain prevention. She had her last infusion on Thursday. She also gets botox  for her migraines. She is followed by [facility]. She reports that her migraine is atypical in that her symptoms are much more persistent that usual and she is having more migraines per month than usual. She denies visual changes, fever, neck pain.       Physical Exam   Triage Vital Signs: ED Triage Vitals  Encounter Vitals Group     BP 07/16/24 1007 (!) 144/96     Girls Systolic BP Percentile --      Girls Diastolic BP Percentile --      Boys Systolic BP Percentile --      Boys Diastolic BP Percentile --      Pulse Rate 07/16/24 1007 78     Resp 07/16/24 1007 18     Temp 07/16/24 1007 98.5 F (36.9 C)     Temp Source 07/16/24 1007 Oral     SpO2 07/16/24 1007 98 %     Weight 07/16/24 1006 118 lb (53.5 kg)     Height 07/16/24 1006 4' 11 (1.499 m)     Head Circumference --      Peak Flow --      Pain Score 07/16/24 1006 4     Pain Loc --      Pain Education --      Exclude from Growth Chart --     Most recent vital signs: Vitals:   07/16/24 1007  BP: (!) 144/96  Pulse: 78  Resp: 18  Temp: 98.5 F (36.9 C)  SpO2: 98%   General: Awake, no distress.  CV:  Good peripheral perfusion. RRR. Resp:  Normal effort. CTAB.  Abd:  No distention.  Other:  PERRL, EOM intact, no focal neuro deficits, no pronator drift, no ataxia.    ED Results / Procedures /  Treatments   Labs (all labs ordered are listed, but only abnormal results are displayed) Labs Reviewed  CBC WITH DIFFERENTIAL/PLATELET  COMPREHENSIVE METABOLIC PANEL WITH GFR    RADIOLOGY  CT head obtained, interpreted the images as well as reviewed the radiologist report which was negative for any acute abnormalities.    PROCEDURES:  Critical Care performed: No  Procedures   MEDICATIONS ORDERED IN ED: Medications  sodium chloride  0.9 % bolus 1,000 mL (1,000 mLs Intravenous New Bag/Given 07/16/24 1143)  ondansetron  (ZOFRAN ) injection 4 mg (4 mg Intravenous Given 07/16/24 1144)  ketorolac  (TORADOL ) 15 MG/ML injection 30 mg (30 mg Intravenous Given 07/16/24 1144)  dexamethasone  (DECADRON ) injection 10 mg (10 mg Intravenous Given 07/16/24 1145)     IMPRESSION / MDM / ASSESSMENT AND PLAN / ED COURSE  I reviewed the triage vital signs and the nursing notes.                             68 year old  female presents for evaluation of a migraine.  Vital signs are stable aside from elevated blood pressure.  Patient does appear quite uncomfortable on exam.  Differential diagnosis includes, but is not limited to, migraine, tension headache, less likely intracranial bleed, brain mass.  Patient's presentation is most consistent with exacerbation of chronic illness.  Patient's preferred migraine cocktail is fluids, Zofran , Toradol  and Decadron .  Low suspicion for subarachnoid hemorrhage as patient's description of her symptoms are not consistent with this.  Given that she is reporting her migraines are increasing in frequency and are more persistent will obtain a Noncon head CT to rule out structural changes.  CBC and CMP are unremarkable.  CT scan is negative for acute abnormalities.  Patient reports she is feeling significantly better after receiving the IV fluids and medications.  Advised her to follow-up with her neurologist.  She voiced understanding, all questions were answered and  she was stable at discharge.      FINAL CLINICAL IMPRESSION(S) / ED DIAGNOSES   Final diagnoses:  Chronic migraine without aura with status migrainosus, not intractable     Rx / DC Orders   ED Discharge Orders     None        Note:  This document was prepared using Dragon voice recognition software and may include unintentional dictation errors.   Cleaster Tinnie LABOR, PA-C 07/16/24 1344    Clarine Ozell LABOR, MD 07/16/24 [phone]

## 2024-07-16 NOTE — ED Triage Notes (Signed)
 Pt via POV from home. Pt c/o migraine with nausea and light sensitivity. Reports that she has a hx of migraine, but this one has lasted 9 days. Pt is A&OX4 and NAD, ambulatory to triage.

## 2024-07-16 NOTE — Discharge Instructions (Signed)
 Your blood work and CT scan was normal today.  Please follow-up with your neurologist.  Return to the emergency department with any worsening symptoms.

## 2024-07-17 ENCOUNTER — Telehealth: Payer: Self-pay | Admitting: Neurology

## 2024-07-17 ENCOUNTER — Telehealth (INDEPENDENT_AMBULATORY_CARE_PROVIDER_SITE_OTHER): Admitting: Neurology

## 2024-07-17 ENCOUNTER — Telehealth: Payer: Self-pay | Admitting: Pharmacist

## 2024-07-17 DIAGNOSIS — G43009 Migraine without aura, not intractable, without status migrainosus: Secondary | ICD-10-CM | POA: Diagnosis not present

## 2024-07-17 MED ORDER — KETOROLAC TROMETHAMINE 10 MG PO TABS: 10.0000 mg | ORAL_TABLET | Freq: Three times a day (TID) | ORAL | 11 refills | Status: AC | PRN

## 2024-07-17 MED ORDER — NARATRIPTAN HCL 2.5 MG PO TABS
2.5000 mg | ORAL_TABLET | ORAL | 11 refills | Status: AC | PRN
Start: 2024-07-17 — End: ?

## 2024-07-17 MED ORDER — ATENOLOL 25 MG PO TABS: 25.0000 mg | ORAL_TABLET | Freq: Every day | ORAL | 4 refills | Status: AC

## 2024-07-17 MED ORDER — UBRELVY 100 MG PO TABS: 100.0000 mg | ORAL_TABLET | ORAL | 11 refills | Status: DC | PRN

## 2024-07-17 NOTE — Progress Notes (Signed)
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 Toribio SQUIBB, MD;  Location: [facility];  Service: Endoscopy;  Laterality: N/A;   LAPAROSCOPIC BILATERAL SALPINGO OOPHERECTOMY Bilateral 10/27/2019   Procedure: LAPAROSCOPIC BILATERAL SALPINGO OOPHORECTOMY WITH PERITONEAL WASHINGS;  Surgeon: Lavoie, Marie-Lyne, MD;  Location: [facility] [place];  Service: Gynecology;  Laterality: Bilateral;  request 1:00pm on Friday, Dec. 4th in [place] Iqueue time held requests one hour OR time   moles removed from upper back, face lft, 1 between breasts, upper leg left inside  10/18/2019   wearing small round bandaids on for 2 weeks   ROTATOR CUFF REPAIR Bilateral 08/2008   UPPER GASTROINTESTINAL ENDOSCOPY      Current Outpatient Medications  Medication Sig Dispense Refill   Ubrogepant  (UBRELVY ) 100 MG TABS Take 1 tablet (100 mg total) by mouth every 2 (two) hours as needed. Maximum 200mg  a day. 16 tablet 11   ALPRAZolam  (XANAX ) 0.5 MG tablet TAKE 1/2 TO 1 TABLET BY MOUTH ONCE DAILY AS NEEDED FOR SLEEP/ANXIETY 15 tablet 2    atenolol  (TENORMIN ) 25 MG tablet Take 1 tablet (25 mg total) by mouth daily. For migraine prevention 90 tablet 4   B Complex Vitamins (B COMPLEX PO) Take by mouth.     baclofen  (LIORESAL ) 10 MG tablet Take 1 tablet (10 mg total) by mouth 3 (three) times daily. 30 each 0   CALCIUM  CARBONATE-VITAMIN D  PO Take 1 tablet by mouth daily. 1000 mg     Coenzyme Q10 (CO Q 10 PO) Take 1 capsule by mouth daily.     denosumab  (PROLIA ) 60 MG/ML SOSY injection Inject 60 mg into the skin every 6 (six) months. (Patient not taking: Reported on 07/12/2024)     Eptinezumab -jjmr (VYEPTI  IV) Inject into the vein every 3 (three) months.     famotidine  (PEPCID ) 40 MG tablet Take 40 mg by mouth 2 (two) times daily.      hydrocortisone  2.5 % cream Apply to affected areas BID PRN as directed. 30 g 3   hydrOXYzine  (ATARAX ) 25 MG tablet Take 37.5 mg by mouth at bedtime as needed.     ketorolac  (TORADOL ) 10 MG tablet Take 1 tablet (10 mg total) by mouth every 8 (eight) hours as  needed. For migraine. 20 tablet 11   letrozole  (FEMARA ) 2.5 MG tablet Take 1 tablet (2.5 mg total) by mouth daily. 90 tablet 3   levocetirizine (XYZAL) 5 MG tablet Take 5 mg by mouth at bedtime.   3   levothyroxine  (SYNTHROID ) 75 MCG tablet TAKE 1 TABLET BY MOUTH EVERY DAY BEFORE BREAKFAST 90 tablet 2   Magnesium  Oxide 400 (240 Mg) MG TABS Take 400 mg by mouth daily.      metFORMIN  (GLUCOPHAGE -XR) 500 MG 24 hr tablet Take 1 tablet (500 mg total) by mouth daily with breakfast. 90 tablet 3   Multiple Vitamin (MULTIVITAMIN WITH MINERALS) TABS tablet Take 1 tablet by mouth daily.     naratriptan  (AMERGE) 2.5 MG tablet Take 1 tablet (2.5 mg total) by mouth as needed for migraine. Take one (1) tablet at onset of headache; if returns or does not resolve, may repeat after 4 hours; do not exceed five (5) mg in 24 hours. 12 tablet 11   neomycin-polymyxin b-dexamethasone  (MAXITROL) 3.5-10000-0.1 SUSP 1 drop 3 (three) times daily.     Omega-3 Fatty Acids (FISH OIL)  1000 MG CAPS Take 1,000 mg by mouth daily.      OnabotulinumtoxinA  (BOTOX  IJ) Inject 1 Dose as directed every 6 (six) weeks.      ondansetron  (ZOFRAN  ODT) 4 MG disintegrating tablet Take 1 tablet (4 mg total) by mouth every 8 (eight) hours as needed. 20 tablet 0   pantoprazole  (PROTONIX ) 20 MG tablet TAKE 1 TABLET BY MOUTH EVERY DAY 90 tablet 1   PRESCRIPTION MEDICATION Inject into the skin every 6 (six) weeks. Steroid Trigger     Probiotic Product (PROBIOTIC-10 PO) Take 1 capsule by mouth daily.      promethazine  (PHENERGAN ) 25 MG tablet Take 25 mg by mouth daily as needed.     triamcinolone  cream (KENALOG ) 0.1 % Apply to affected areas BID PRN for up to two weeks at a time as directed. 30 g 1   TURMERIC PO Take 1 tablet by mouth daily.     VITAMIN D  PO Take 1 capsule by mouth daily.     No current facility-administered medications for this visit.    Allergies as of 07/17/2024 - Review Complete 07/16/2024  Allergen Reaction Noted   Ciprofloxacin Other (See Comments) 09/21/2018   Codeine phosphate Other (See Comments) 02/07/2007   Decongestant [pseudoephedrine] Other (See Comments) 09/21/2018   Diclofenac  12/23/2022   Diphenhydramine hcl (sleep)  04/19/2022   Flonase  [fluticasone  propionate] Other (See Comments) 11/13/2014   Fluticasone   04/19/2022   Hydrocodone  Other (See Comments) 11/27/2008   Macrobid  [nitrofurantoin ]  01/22/2021   Nasal spray  04/19/2022   Nasonex  [mometasone ]  10/29/2021   Reglan [metoclopramide] Other (See Comments) 09/23/2018   Topamax [topiramate] Other (See Comments) 09/21/2018   Benadryl [diphenhydramine hcl] Palpitations 10/18/2017   Sulfa antibiotics Anxiety 09/05/2014    Vitals: There were no vitals taken for this visit. Last Weight:  Wt Readings from Last 1 Encounters:  07/16/24 118 lb (53.5 kg)   Last Height:   Ht Readings from Last 1 Encounters:  07/16/24 4' 11 (1.499 m)    Physical exam: Exam: Gen: NAD, conversant      CV: No  palpitations or chest pain or SOB. VS: Breathing at a normal rate. Weight appears within normal limits. Not febrile. Eyes: Conjunctivae clear without exudates or hemorrhage  Neuro: Detailed Neurologic Exam  Speech:    Speech is normal; fluent and spontaneous with normal comprehension.  Cognition:  The patient is oriented to person, place, and time;     recent and remote memory intact;     language fluent;     normal attention, concentration, fund of knowledge Cranial Nerves:    The pupils are equal, round, and reactive to light. Visual fields are full Extraocular movements are intact.  The face is symmetric with normal sensation. The palate elevates in the midline. Hearing intact. Voice is normal. Shoulder shrug is normal. The tongue has normal motion without fasciculations.   Coordination: normal  Gait:    No abnormalities noted or reported  Motor Observation:   no involuntary movements noted. Tone:    Appears normal  Posture:    Posture is normal. normal erect    Strength:    Strength is anti-gravity and symmetric in the upper and lower limbs.      Sensation: intact to LT, no reports of numbness or tingling or paresthesias        Assessment/Plan:  Patient with chronic migraines for years, Emgality  not beneficial anymore, Vyepti  not helpful, has been to the ED > 7 times this year for migraines/headaches,   -Continue Vyepti  300mg  at this time - Continue botox  for migraines - continue naratriptan , toradol  acutely prn - Try Ubrelvy  acutely (nurtec in the past was helpful every other day possibly could go back to that if the vyepti  not improve her migraines over the next 1-2 infusions. She has had 2 x300mg  vuepti infusions last was 4 days ago will continue and monitor. - other options for prevention includes ajovy /aimovig to try (tried turkey) - did NOT try Ajovy  or Aimovig - increase Atenolol  25mg  daily (increased from 12.5mg  ) - for acute management can try zavzpret  n  the future as well- she did not try this or the ajovy   - As acute tried multiple triptans last naratriptan , not as effective as she would like, can try Zavzpret  as is fast acting and patient has acute onset migraines necessitating dast action, the American headache Society recommends and oral and non oral fast acting acute medications. Will try Zavzpret  nasal (imitrex had side effects cannot use injections or nasal), can consider other triptans(frovatriptan) or Reyviw or ubrelvy  or zavzpret  or nurtec(was not approved)  - From a thorough review of records, medications tried greater than 3 months that can be used in headache or migraine management include: Tylenol , ibuprofen , Xanax , aspirin , atenolol , baclofen , Fioricet , Decadron , gabapentin , hydrocodone , hydroxyzine , ketorolac  injections and tablets, Claritin, mag oxide p.o., mag sulfate IV, methylprednisolone , Botox  since 2019, Zofran , prednisone  tablets on multiple occasions, Compazine  injections, promethazine  injections and oral, sumatriptan, rizatriptan , tramadol , amitriptyline, nortriptyline, topiramate, zonisamide, Robaxin, Aimovig contraindicated due to constipation, cephaly, Emgality ,vyepti , naratriptan , qulipta, emgality    Meds ordered this encounter  Medications   Ubrogepant  (UBRELVY ) 100 MG TABS    Sig: Take 1 tablet (100 mg total) by mouth every 2 (two) hours as needed. Maximum 200mg  a day.    Dispense:  16 tablet    Refill:  11   atenolol  (TENORMIN ) 25 MG tablet    Sig: Take 1 tablet (25 mg total) by mouth daily. For migraine prevention    Dispense:  90 tablet    Refill:  4   ketorolac  (TORADOL ) 10 MG tablet    Sig: Take 1 tablet (10 mg total) by mouth every 8 (eight) hours as needed. For migraine.    Dispense:  20 tablet    Refill:  11    This is a monthly prescription, do not fill early  naratriptan  (AMERGE) 2.5 MG tablet    Sig: Take 1 tablet (2.5 mg total) by mouth as needed for migraine. Take one (1) tablet at onset of  headache; if returns or does not resolve, may repeat after 4 hours; do not exceed five (5) mg in 24 hours.    Dispense:  12 tablet    Refill:  11    This is a one month prescription, do not fill early    Cc: Tower, Laine LABOR, MD,  Tower, Laine LABOR, MD  Jennifer Epp, MD  [facility] [address] [place], [place] [address]  Phone [phone] Fax [phone]  I spent over 40 minutes of face-to-face and non-face-to-face time with patient on the  1. Migraine without aura and without status migrainosus, not intractable      diagnosis.  This included previsit chart review, lab review, study review, order entry, electronic health record documentation, patient education on the different diagnostic and therapeutic options, counseling and coordination of care, risks and benefits of management, compliance, or risk factor reduction

## 2024-07-17 NOTE — Telephone Encounter (Signed)
 Pharmacy Patient Advocate Encounter   Received notification from Patient Pharmacy that prior authorization for Ubrelvy  100MG  tablets is required/requested.   Insurance verification completed.   The patient is insured through [facility] ADVANTAGE/RX ADVANCE .   Per test claim: PA required; PA submitted to above mentioned insurance via Latent Key/confirmation #/EOC [id number] Status is pending

## 2024-07-17 NOTE — Telephone Encounter (Signed)
 Schedule her with a follow up with sarah in 6 months please for migraines can be virtual

## 2024-07-17 NOTE — Telephone Encounter (Signed)
 Spoke with patient, informed her that Dr. Ines wanted her to see Lauraine in six months for a VV. Patient was unhappy about this and stated that she shouldn't have to wait so long since she is not doing well and she was questioning why she was seeing Lauraine. I asked patient if Dr. Ines had said to stay in touch with her through mychart and she said that's what I think I'll do. I told her that Dr Ines is great at working people in so if she needed to be seen sooner then we'll work it out. For now, patient agreed to a VV with Sarah for 02/07/25 at 2:15pm and was advised to reach out with any concerns

## 2024-07-18 ENCOUNTER — Telehealth: Payer: Self-pay | Admitting: Neurology

## 2024-07-18 ENCOUNTER — Other Ambulatory Visit (HOSPITAL_COMMUNITY): Payer: Self-pay

## 2024-07-18 NOTE — Telephone Encounter (Signed)
 Pt came into office. States she spoke with her pharmacy about Ubrelvy  that Dr. Ines sent for her after yesterdays virtual visit. Pt states the out of pocket cost for this medication is going to be very high for her and was wondering if she could try samples of the drugs before paying this cost. Would like a call to discuss.

## 2024-07-18 NOTE — Telephone Encounter (Signed)
 I called [facility], they are closed for lunch unitl 1400.

## 2024-07-18 NOTE — Telephone Encounter (Signed)
 Pharmacy Patient Advocate Encounter  Received notification from [facility] ADVANTAGE/RX ADVANCE that Prior Authorization for Ubrelvy  has been APPROVED from 07/17/2024 to 07/17/2025. Unable to obtain price due to refill too soon rejection, last fill date 8/252025 next available fill date9/17/2025   PA #/Case ID/Reference #: [id number]

## 2024-07-18 NOTE — Telephone Encounter (Signed)
 Spoke to [facility], cost for 16 tabs $561.28.  I spoke to pt and she is willing to pay for this if the samples of ubrelvy  work.  She is not asking for a lot of samples, just some to see if works.  I will ask and let her know thru mychart.  She was thankful/appreciative.

## 2024-07-20 ENCOUNTER — Encounter: Payer: Self-pay | Admitting: Neurology

## 2024-07-20 MED ORDER — UBRELVY 100 MG PO TABS: ORAL_TABLET | ORAL | 0 refills | Status: DC

## 2024-07-20 NOTE — Telephone Encounter (Signed)
 Please leave Ubrelvy  samples, 6-8 boxes if available, for patient up front. I will let her know via mychart thank you

## 2024-07-20 NOTE — Telephone Encounter (Signed)
 Placed up front #6 tablets Ubrelvy  100mg  tabs for pick up.

## 2024-07-20 NOTE — Addendum Note (Signed)
 Addended by: NEYSA NENA RAMAN on: 07/20/2024 10:46 AM   Modules accepted: Orders

## 2024-07-26 DIAGNOSIS — H0100A Unspecified blepharitis right eye, upper and lower eyelids: Secondary | ICD-10-CM | POA: Diagnosis not present

## 2024-07-26 DIAGNOSIS — B880 Other acariasis: Secondary | ICD-10-CM | POA: Diagnosis not present

## 2024-07-27 ENCOUNTER — Encounter: Payer: Self-pay | Admitting: Neurology

## 2024-07-27 DIAGNOSIS — G43711 Chronic migraine without aura, intractable, with status migrainosus: Secondary | ICD-10-CM

## 2024-07-27 DIAGNOSIS — G43009 Migraine without aura, not intractable, without status migrainosus: Secondary | ICD-10-CM

## 2024-07-27 NOTE — Telephone Encounter (Signed)
 Error

## 2024-07-28 ENCOUNTER — Encounter: Payer: Self-pay | Admitting: Emergency Medicine

## 2024-07-28 ENCOUNTER — Ambulatory Visit: Admitting: Family Medicine

## 2024-07-28 ENCOUNTER — Other Ambulatory Visit: Payer: Self-pay

## 2024-07-28 ENCOUNTER — Emergency Department
Admission: EM | Admit: 2024-07-28 | Discharge: 2024-07-28 | Disposition: A | Discharge status: Home / Self Care | Attending: Emergency Medicine | Admitting: Emergency Medicine

## 2024-07-28 ENCOUNTER — Telehealth: Payer: Self-pay | Admitting: *Deleted

## 2024-07-28 DIAGNOSIS — G43701 Chronic migraine without aura, not intractable, with status migrainosus: Secondary | ICD-10-CM | POA: Insufficient documentation

## 2024-07-28 DIAGNOSIS — G43709 Chronic migraine without aura, not intractable, without status migrainosus: Secondary | ICD-10-CM | POA: Diagnosis not present

## 2024-07-28 DIAGNOSIS — R519 Headache, unspecified: Secondary | ICD-10-CM | POA: Diagnosis present

## 2024-07-28 MED ORDER — ONDANSETRON HCL 4 MG/2ML IJ SOLN
4.0000 mg | Freq: Once | INTRAMUSCULAR | Status: AC
  Administered 2024-07-28: 4 mg via INTRAVENOUS
  Filled 2024-07-28: qty 2

## 2024-07-28 MED ORDER — DEXAMETHASONE SODIUM PHOSPHATE 10 MG/ML IJ SOLN
10.0000 mg | Freq: Once | INTRAMUSCULAR | Status: AC
  Administered 2024-07-28: 10 mg via INTRAVENOUS
  Filled 2024-07-28: qty 1

## 2024-07-28 MED ORDER — SODIUM CHLORIDE 0.9 % IV BOLUS
500.0000 mL | Freq: Once | INTRAVENOUS | Status: AC
  Administered 2024-07-28: 500 mL via INTRAVENOUS

## 2024-07-28 MED ORDER — KETOROLAC TROMETHAMINE 15 MG/ML IJ SOLN
15.0000 mg | Freq: Once | INTRAMUSCULAR | Status: AC
  Administered 2024-07-28: 15 mg via INTRAVENOUS
  Filled 2024-07-28: qty 1

## 2024-07-28 NOTE — Discharge Instructions (Signed)
 It was a pleasure to care for you today!  Please follow-up with your neurologist regarding the other treatment options.  Return to the emergency department with any worsening symptoms.

## 2024-07-28 NOTE — Telephone Encounter (Signed)
Aware-thanks for letting me know  

## 2024-07-28 NOTE — ED Provider Notes (Signed)
 [facility] Provider Note    Event Date/Time   First MD Initiated Contact with Patient 07/28/24 0900     (approximate)   History   Headache   HPI  Jennifer [name] is a 68 y.o. female with PMH of migraines who presents for a headache today.  Patient is well-known to me as I saw her in the emergency department for the same complaint a little over a week ago.  She has tried multiple different medications for treatment of her migraines without relief.  She is actively pursuing other treatments with her neurologist.  She reports today her migraine is the same as previous ones.  She is just here to get a little bit of relief as she knows the IV medications worked well for her.      Physical Exam   Triage Vital Signs: ED Triage Vitals  Encounter Vitals Group     BP 07/28/24 0849 134/74     Girls Systolic BP Percentile --      Girls Diastolic BP Percentile --      Boys Systolic BP Percentile --      Boys Diastolic BP Percentile --      Pulse Rate 07/28/24 0849 (!) 56     Resp 07/28/24 0849 16     Temp 07/28/24 0849 98 F (36.7 C)     Temp Source 07/28/24 0849 Oral     SpO2 07/28/24 0849 100 %     Weight 07/28/24 0848 117 lb 15.1 oz (53.5 kg)     Height 07/28/24 0848 4' 11 (1.499 m)     Head Circumference --      Peak Flow --      Pain Score 07/28/24 0848 3     Pain Loc --      Pain Education --      Exclude from Growth Chart --     Most recent vital signs: Vitals:   07/28/24 0849  BP: 134/74  Pulse: (!) 56  Resp: 16  Temp: 98 F (36.7 C)  SpO2: 100%   General: Awake, no distress.  CV:  Good peripheral perfusion.  Resp:  Normal effort.  Abd:  No distention.  Other:  No focal neuro deficits.   ED Results / Procedures / Treatments   Labs (all labs ordered are listed, but only abnormal results are displayed) Labs Reviewed - No data to display   PROCEDURES:  Critical Care performed: No  Procedures   MEDICATIONS ORDERED IN  ED: Medications  sodium chloride  0.9 % bolus 500 mL (500 mLs Intravenous New Bag/Given 07/28/24 0953)  ketorolac  (TORADOL ) 15 MG/ML injection 15 mg (15 mg Intravenous Given 07/28/24 0958)  ondansetron  (ZOFRAN ) injection 4 mg (4 mg Intravenous Given 07/28/24 0956)  dexamethasone  (DECADRON ) injection 10 mg (10 mg Intravenous Given 07/28/24 0959)     IMPRESSION / MDM / ASSESSMENT AND PLAN / ED COURSE  I reviewed the triage vital signs and the nursing notes.                             68 year old female presents for evaluation of a headache.  Vital signs stable, patient uncomfortable on exam.  Differential diagnosis includes, but is not limited to, migraine, tension headache, very low suspicion for intracranial bleed as patient's headache is the same as previous migraines and she does not have any neurologic deficits.  Patient's presentation is most consistent with acute, uncomplicated illness.  Patient was given her preferred treatment regimen of IV fluids, Toradol , Zofran  and Decadron .  She reported significant improvement in her symptoms after receiving these medications.  Advised her to follow-up with her neurologist about the other options for treatment.  Patient voiced understanding, all questions were answered and she was stable at discharge.      FINAL CLINICAL IMPRESSION(S) / ED DIAGNOSES   Final diagnoses:  Chronic migraine without aura with status migrainosus, not intractable     Rx / DC Orders   ED Discharge Orders     None        Note:  This document was prepared using Dragon voice recognition software and may include unintentional dictation errors.   Cleaster Tinnie LABOR, PA-C 07/28/24 1046    Arlander Charleston, MD 07/28/24 1308

## 2024-07-28 NOTE — ED Triage Notes (Signed)
 C/O left sided headache, left face, left eye since Sunday. Has been treating pain daily.   AAOx3.  Skin warm and dry. NAD

## 2024-07-28 NOTE — Telephone Encounter (Signed)
 Copied from CRM [id number]. Topic: General - Other >> Jul 28, 2024 11:14 AM Dedra B wrote: Reason for CRM: Pt called to cancel appt and wanted to let provider know that she was seen in the ER today.

## 2024-08-04 ENCOUNTER — Encounter: Payer: Self-pay | Admitting: Family Medicine

## 2024-08-04 MED ORDER — ALPRAZOLAM 0.5 MG PO TABS: ORAL_TABLET | ORAL | 2 refills | Status: AC

## 2024-08-04 NOTE — Telephone Encounter (Signed)
 Name of Medication: Xanax  Name of Pharmacy: [facility] Last Fill or Written Date and Quantity: 10/29/23 #15 tabs/ 2 refill  Last Office Visit and Type: Hospital f/u on 07/12/24 Next Office Visit and Type: none scheduled

## 2024-08-11 NOTE — Telephone Encounter (Signed)
 Spoke to patient . Pt wanting to f/u about referrals for treatment of migraines  that was discussed at her last office visit with Dr Ines . Made patient aware Dr Ines is out until the middle of next week. Pt is asking for a sooner appointment to discuss other options to treat Migraines . Pt has a botox  appointment 09/14/2024 with Lauraine NP . Made Pt aware will forward concerns to Sarah,NP and see if she can send Referrals next week. When she comes for her botox  appointment can discuss further with Sarah,NP Pt agreed with Plan of care moving forward.

## 2024-08-12 ENCOUNTER — Emergency Department
Admission: EM | Admit: 2024-08-12 | Discharge: 2024-08-12 | Disposition: A | Discharge status: Home / Self Care | Attending: Emergency Medicine | Admitting: Emergency Medicine

## 2024-08-12 ENCOUNTER — Other Ambulatory Visit: Payer: Self-pay

## 2024-08-12 ENCOUNTER — Encounter: Payer: Self-pay | Admitting: *Deleted

## 2024-08-12 ENCOUNTER — Other Ambulatory Visit: Payer: Self-pay | Admitting: Medical Genetics

## 2024-08-12 DIAGNOSIS — G43809 Other migraine, not intractable, without status migrainosus: Secondary | ICD-10-CM | POA: Insufficient documentation

## 2024-08-12 MED ORDER — ONDANSETRON HCL 4 MG/2ML IJ SOLN
4.0000 mg | Freq: Once | INTRAMUSCULAR | Status: AC
  Administered 2024-08-12: 4 mg via INTRAVENOUS
  Filled 2024-08-12: qty 2

## 2024-08-12 MED ORDER — DEXAMETHASONE SODIUM PHOSPHATE 10 MG/ML IJ SOLN
8.0000 mg | Freq: Once | INTRAMUSCULAR | Status: AC
  Administered 2024-08-12: 8 mg via INTRAVENOUS
  Filled 2024-08-12: qty 1

## 2024-08-12 MED ORDER — SODIUM CHLORIDE 0.9 % IV BOLUS
500.0000 mL | Freq: Once | INTRAVENOUS | Status: AC
  Administered 2024-08-12: 500 mL via INTRAVENOUS

## 2024-08-12 MED ORDER — KETOROLAC TROMETHAMINE 30 MG/ML IJ SOLN
30.0000 mg | Freq: Once | INTRAMUSCULAR | Status: AC
  Administered 2024-08-12: 30 mg via INTRAVENOUS
  Filled 2024-08-12: qty 1

## 2024-08-12 NOTE — ED Triage Notes (Signed)
 Pt has had a migraine since Sunday and has been treating herself at home with sumitriptan but when med wears off migraine returns.  Pt states that her pain is in her whole head with sharper pain on top of her head with stabbing pain in her right eye.  Pt denies any nausea, pt has photophobia.  No focal weakness.

## 2024-08-12 NOTE — ED Provider Notes (Signed)
 [facility] Provider Note    Event Date/Time   First MD Initiated Contact with Patient 08/12/24 [phone]     (approximate)   History   Migraine   HPI  Jennifer [name] is a 68 y.o. female who has a history of difficult to control migraines who presents with typical migraine headache.  She reports she has taken all of her typical medications but symptoms continue, on this happen she usually needs IV Decadron , IV Zofran , IV fluids and IV Toradol .  Per review of records the patient was last seen for this on September 5     Physical Exam   Triage Vital Signs: ED Triage Vitals  Encounter Vitals Group     BP 08/12/24 0848 (!) 143/82     Girls Systolic BP Percentile --      Girls Diastolic BP Percentile --      Boys Systolic BP Percentile --      Boys Diastolic BP Percentile --      Pulse Rate 08/12/24 0848 65     Resp 08/12/24 0848 16     Temp 08/12/24 0848 98.1 F (36.7 C)     Temp Source 08/12/24 0848 Oral     SpO2 08/12/24 0848 100 %     Weight --      Height --      Head Circumference --      Peak Flow --      Pain Score 08/12/24 0849 2     Pain Loc --      Pain Education --      Exclude from Growth Chart --     Most recent vital signs: Vitals:   08/12/24 0848 08/12/24 1100  BP: (!) 143/82 119/69  Pulse: 65 (!) 57  Resp: 16   Temp: 98.1 F (36.7 C)   SpO2: 100% 100%     General: Awake, no distress.. CV:  Good peripheral perfusion.  Resp:  Normal effort.  Abd:  No distention.  Other:  No neurodeficits, reassuring exam   ED Results / Procedures / Treatments   Labs (all labs ordered are listed, but only abnormal results are displayed) Labs Reviewed - No data to display   EKG     RADIOLOGY     PROCEDURES:  Critical Care performed:   Procedures   MEDICATIONS ORDERED IN ED: Medications  ondansetron  (ZOFRAN ) injection 4 mg (4 mg Intravenous Given 08/12/24 1012)  ketorolac  (TORADOL ) 30 MG/ML injection 30 mg (30 mg  Intravenous Given 08/12/24 1012)  dexamethasone  (DECADRON ) injection 8 mg (8 mg Intravenous Given 08/12/24 1012)  sodium chloride  0.9 % bolus 500 mL (0 mLs Intravenous Stopped 08/12/24 1109)     IMPRESSION / MDM / ASSESSMENT AND PLAN / ED COURSE  I reviewed the triage vital signs and the nursing notes. Patient's presentation is most consistent with severe exacerbation of chronic illness.  Patient presents with intractable migraine, no concerning red flag symptoms or neurodeficits.  Will treat with IV Decadron , IV Zofran , IV Toradol  and IV fluids.  She reports she has had all of these before and they are what work for her, she denies allergies to them.  Patient felt better after treatment, appropriate for discharge      FINAL CLINICAL IMPRESSION(S) / ED DIAGNOSES   Final diagnoses:  Other migraine without status migrainosus, not intractable     Rx / DC Orders   ED Discharge Orders     None  Note:  This document was prepared using Dragon voice recognition software and may include unintentional dictation errors.   Arlander Charleston, MD 08/12/24 1149

## 2024-08-15 ENCOUNTER — Other Ambulatory Visit: Payer: Medicare HMO

## 2024-08-15 ENCOUNTER — Ambulatory Visit: Payer: Medicare HMO | Admitting: Hematology and Oncology

## 2024-08-16 ENCOUNTER — Telehealth: Payer: Self-pay | Admitting: Neurology

## 2024-08-16 NOTE — Telephone Encounter (Signed)
 Referral for neurosurgery fax to [facility]. Phone: [phone], Fax: [phone]

## 2024-08-16 NOTE — Telephone Encounter (Signed)
 Submitted auth request via CMM to transition pt to SP, status is pending. Key: [id number]

## 2024-08-16 NOTE — Telephone Encounter (Signed)
 Referral for psychology fax to [facility]. Phone: Fax: [phone]

## 2024-08-16 NOTE — Addendum Note (Signed)
 Addended by: GAYLAND LAURAINE PARAS on: 08/16/2024 05:42 AM   Modules accepted: Orders

## 2024-08-17 ENCOUNTER — Telehealth: Admitting: Adult Health

## 2024-08-23 ENCOUNTER — Other Ambulatory Visit: Payer: Self-pay | Admitting: *Deleted

## 2024-08-23 ENCOUNTER — Encounter: Payer: Self-pay | Admitting: *Deleted

## 2024-08-23 DIAGNOSIS — Z170 Estrogen receptor positive status [ER+]: Secondary | ICD-10-CM

## 2024-08-23 NOTE — Progress Notes (Signed)
 Signatera renewal orders placed per MD request.

## 2024-08-24 ENCOUNTER — Telehealth: Payer: Self-pay | Admitting: *Deleted

## 2024-08-24 NOTE — Telephone Encounter (Signed)
 Pt has an appt with Lauraine Born, NP for botox  on 09-14-2024.  Lauraine will do the order.

## 2024-08-24 NOTE — Telephone Encounter (Signed)
 Jennifer [name] July 15, 1956 will need a new order with a provider her new PA is due to start on 09/09/24 for her next infusion on 11/18

## 2024-08-24 NOTE — Telephone Encounter (Signed)
 Sent message to [place] in [place].

## 2024-08-29 ENCOUNTER — Ambulatory Visit: Admitting: Podiatry

## 2024-08-29 DIAGNOSIS — M722 Plantar fascial fibromatosis: Secondary | ICD-10-CM

## 2024-08-29 NOTE — Progress Notes (Unsigned)
 Subjective:  Patient ID: Jennifer [name], female    DOB: October 23, 1956,  MRN: [id number]  Chief Complaint  Patient presents with   Nail Problem    Nail trim     68 y.o. female presents with the above complaint.  Patient presents with complaint left heel pain that has been going on for quite some time is progressive gotten worse worse with ambulation and shoe pressure has not seen MRIs prior to seeing me denies any other acute complaints would like to discuss treatment options for this pain scale 7 out of 10 dull aching nature   Review of Systems: Negative except as noted in the HPI. Denies N/V/F/Ch.  Past Medical History:  Diagnosis Date   Allergic rhinitis    Allergy    seasonal   Anemia    Anxiety    Arthritis    hands, knees   Asthma    as a child   Blood transfusion without reported diagnosis    as a child   Breast cancer (HCC) 2019   Right Breast Cancer   Cancer (HCC) 09/2018   Breast CA new DX, right breast   Cervical dysplasia    Common bile duct dilation    Diabetes mellitus without complication (HCC)    Endometriosis    Esophageal reflux    Family history of adverse reaction to anesthesia    younger sister anxiety for a dew days after   Family history of breast cancer    Family history of breast cancer    Family history of prostate cancer    Hepatic cyst 08/2020   multiple   Hepatic hemangioma 08/2020   intrahepatic hemangioma   Hiatal hernia    HSV-1 (herpes simplex virus 1) infection    Hyperlipidemia    Hypertension    pt.denies 10/19/22   Hypothyroid    Migraine with aura, without mention of intractable migraine without mention of status migrainosus    Osteopenia 08/2019   T score -2.2 FRAX 11% / 1.7%   Osteoporosis 05/07/2008   Qualifier: Diagnosis of  By: Randeen MD, Laine Caldron    Personal history of radiation therapy 2019   Right Breast Cancer   Pre-diabetes    Raynaud's disease     Current Outpatient Medications:    ALPRAZolam  (XANAX ) 0.5  MG tablet, TAKE 1/2 TO 1 TABLET BY MOUTH ONCE DAILY AS NEEDED FOR SLEEP/ANXIETY, Disp: 15 tablet, Rfl: 2   atenolol  (TENORMIN ) 25 MG tablet, Take 1 tablet (25 mg total) by mouth daily. For migraine prevention, Disp: 90 tablet, Rfl: 4   B Complex Vitamins (B COMPLEX PO), Take by mouth., Disp: , Rfl:    baclofen  (LIORESAL ) 10 MG tablet, Take 1 tablet (10 mg total) by mouth 3 (three) times daily., Disp: 30 each, Rfl: 0   CALCIUM  CARBONATE-VITAMIN D  PO, Take 1 tablet by mouth daily. 1000 mg, Disp: , Rfl:    Coenzyme Q10 (CO Q 10 PO), Take 1 capsule by mouth daily., Disp: , Rfl:    denosumab  (PROLIA ) 60 MG/ML SOSY injection, Inject 60 mg into the skin every 6 (six) months. (Patient not taking: Reported on 07/12/2024), Disp: , Rfl:    Eptinezumab -jjmr (VYEPTI  IV), Inject into the vein every 3 (three) months., Disp: , Rfl:    famotidine  (PEPCID ) 40 MG tablet, Take 40 mg by mouth 2 (two) times daily. , Disp: , Rfl:    hydrocortisone  2.5 % cream, Apply to affected areas BID PRN as directed., Disp: 30 g, Rfl: 3  hydrOXYzine  (ATARAX ) 25 MG tablet, Take 37.5 mg by mouth at bedtime as needed., Disp: , Rfl:    ketorolac  (TORADOL ) 10 MG tablet, Take 1 tablet (10 mg total) by mouth every 8 (eight) hours as needed. For migraine., Disp: 20 tablet, Rfl: 11   letrozole  (FEMARA ) 2.5 MG tablet, Take 1 tablet (2.5 mg total) by mouth daily., Disp: 90 tablet, Rfl: 3   levocetirizine (XYZAL) 5 MG tablet, Take 5 mg by mouth at bedtime. , Disp: , Rfl: 3   levothyroxine  (SYNTHROID ) 75 MCG tablet, TAKE 1 TABLET BY MOUTH EVERY DAY BEFORE BREAKFAST, Disp: 90 tablet, Rfl: 2   Magnesium  Oxide 400 (240 Mg) MG TABS, Take 400 mg by mouth daily. , Disp: , Rfl:    metFORMIN  (GLUCOPHAGE -XR) 500 MG 24 hr tablet, Take 1 tablet (500 mg total) by mouth daily with breakfast., Disp: 90 tablet, Rfl: 3   Multiple Vitamin (MULTIVITAMIN WITH MINERALS) TABS tablet, Take 1 tablet by mouth daily., Disp: , Rfl:    naratriptan  (AMERGE) 2.5 MG tablet,  Take 1 tablet (2.5 mg total) by mouth as needed for migraine. Take one (1) tablet at onset of headache; if returns or does not resolve, may repeat after 4 hours; do not exceed five (5) mg in 24 hours., Disp: 12 tablet, Rfl: 11   neomycin-polymyxin b-dexamethasone  (MAXITROL) 3.5-10000-0.1 SUSP, 1 drop 3 (three) times daily., Disp: , Rfl:    Omega-3 Fatty Acids (FISH OIL) 1000 MG CAPS, Take 1,000 mg by mouth daily. , Disp: , Rfl:    OnabotulinumtoxinA  (BOTOX  IJ), Inject 1 Dose as directed every 6 (six) weeks. , Disp: , Rfl:    ondansetron  (ZOFRAN  ODT) 4 MG disintegrating tablet, Take 1 tablet (4 mg total) by mouth every 8 (eight) hours as needed., Disp: 20 tablet, Rfl: 0   pantoprazole  (PROTONIX ) 20 MG tablet, TAKE 1 TABLET BY MOUTH EVERY DAY, Disp: 90 tablet, Rfl: 1   PRESCRIPTION MEDICATION, Inject into the skin every 6 (six) weeks. Steroid Trigger, Disp: , Rfl:    Probiotic Product (PROBIOTIC-10 PO), Take 1 capsule by mouth daily. , Disp: , Rfl:    promethazine  (PHENERGAN ) 25 MG tablet, Take 25 mg by mouth daily as needed., Disp: , Rfl:    triamcinolone  cream (KENALOG ) 0.1 %, Apply to affected areas BID PRN for up to two weeks at a time as directed., Disp: 30 g, Rfl: 1   TURMERIC PO, Take 1 tablet by mouth daily., Disp: , Rfl:    Ubrogepant  (UBRELVY ) 100 MG TABS, Take 1 tablet (100 mg total) by mouth every 2 (two) hours as needed. Maximum 200mg  a day., Disp: 16 tablet, Rfl: 11   Ubrogepant  (UBRELVY ) 100 MG TABS, Take 1 tablet onset migraine , may repeat in 2 hrs if needed. (Max 2/ 24 hrs), Disp: 6 tablet, Rfl: 0   VITAMIN D  PO, Take 1 capsule by mouth daily., Disp: , Rfl:   Social History   Tobacco Use  Smoking Status Never   Passive exposure: Never  Smokeless Tobacco Never    Allergies  Allergen Reactions   Ciprofloxacin Other (See Comments)    Dizziness    Codeine Phosphate Other (See Comments)    Dizziness, heart palpitations, nervous   Decongestant [Pseudoephedrine] Other (See  Comments)    dizziness   Diclofenac    Diphenhydramine Hcl (Sleep)    Flonase  [Fluticasone  Propionate] Other (See Comments)    Worsens her migraine    Fluticasone     Hydrocodone  Other (See Comments)  Made nervous--10/09 surgery rotator cuff   Macrobid  [Nitrofurantoin ]     headaches   Nasal Spray    Nasonex  [Mometasone ]     Migraines   Reglan [Metoclopramide] Other (See Comments)    Dizziness   Topamax [Topiramate] Other (See Comments)    Dizziness    Benadryl [Diphenhydramine Hcl] Palpitations   Sulfa Antibiotics Anxiety   Objective:  There were no vitals filed for this visit. There is no height or weight on file to calculate BMI. Constitutional Well developed. Well nourished.  Vascular Dorsalis pedis pulses palpable bilaterally. Posterior tibial pulses palpable bilaterally. Capillary refill normal to all digits.  No cyanosis or clubbing noted. Pedal hair growth normal.  Neurologic Normal speech. Oriented to person, place, and time. Epicritic sensation to light touch grossly present bilaterally.  Dermatologic Nails well groomed and normal in appearance. No open wounds. No skin lesions.  Orthopedic: Normal joint ROM without pain or crepitus bilaterally. No visible deformities. Tender to palpation at the calcaneal tuber left. No pain with calcaneal squeeze left. Ankle ROM diminished range of motion left. Silfverskiold Test: positive left.   Radiographs: None  Assessment:   1. Plantar fasciitis of left foot    Plan:  Patient was evaluated and treated and all questions answered.  Plantar Fasciitis, left - XR reviewed as above.  - Educated on icing and stretching. Instructions given.  - Injection delivered to the plantar fascia as below. - DME: Plantar fascial brace dispensed to support the medial longitudinal arch of the foot and offload pressure from the heel and prevent arch collapse during weightbearing - Pharmacologic management: None  Procedure:  Injection Tendon/Ligament Location: Left plantar fascia at the glabrous junction; medial approach. Skin Prep: alcohol Injectate: 0.5 cc 0.5% marcaine  plain, 0.5 cc of 1% Lidocaine , 0.5 cc kenalog  10. Disposition: Patient tolerated procedure well. Injection site dressed with a band-aid.  No follow-ups on file.

## 2024-09-06 ENCOUNTER — Other Ambulatory Visit: Payer: Self-pay

## 2024-09-06 ENCOUNTER — Other Ambulatory Visit: Payer: Self-pay | Admitting: Hematology and Oncology

## 2024-09-06 MED ORDER — BOTOX 200 UNITS IJ SOLR
200.0000 [IU] | INTRAMUSCULAR | 2 refills | Status: AC
  Filled 2024-09-06: qty 1, 90d supply, fill #0

## 2024-09-06 NOTE — Telephone Encounter (Signed)
 Received approval, please send rx to [facility]. She has an appt on 10/23.  Auth#: [id number] (08/23/24-08/23/25)

## 2024-09-06 NOTE — Addendum Note (Signed)
 Addended by: SHONA SAVANT A on: 09/06/2024 09:33 AM   Modules accepted: Orders

## 2024-09-07 ENCOUNTER — Other Ambulatory Visit: Payer: Self-pay

## 2024-09-07 ENCOUNTER — Other Ambulatory Visit (HOSPITAL_COMMUNITY): Payer: Self-pay

## 2024-09-07 NOTE — Telephone Encounter (Signed)
 Pt's copay with [facility] is $482.16. It looks like she was paying about half of that as B/B. I will leave her as B/B for the appt on 10/23.   Lauraine, can you take a look at her and see if there's an alternative to Botox  she could try, or if it's reasonable to keep her as B/B?

## 2024-09-13 DIAGNOSIS — G43909 Migraine, unspecified, not intractable, without status migrainosus: Secondary | ICD-10-CM | POA: Diagnosis not present

## 2024-09-13 DIAGNOSIS — M5481 Occipital neuralgia: Secondary | ICD-10-CM | POA: Diagnosis not present

## 2024-09-14 ENCOUNTER — Telehealth: Payer: Self-pay | Admitting: Neurology

## 2024-09-14 ENCOUNTER — Ambulatory Visit (INDEPENDENT_AMBULATORY_CARE_PROVIDER_SITE_OTHER): Admitting: Neurology

## 2024-09-14 ENCOUNTER — Encounter: Payer: Self-pay | Admitting: Neurology

## 2024-09-14 VITALS — BP 110/68 | HR 63

## 2024-09-14 DIAGNOSIS — G43709 Chronic migraine without aura, not intractable, without status migrainosus: Secondary | ICD-10-CM | POA: Diagnosis not present

## 2024-09-14 DIAGNOSIS — G43009 Migraine without aura, not intractable, without status migrainosus: Secondary | ICD-10-CM

## 2024-09-14 MED ORDER — ONABOTULINUMTOXINA 200 UNITS IJ SOLR
155.0000 [IU] | Freq: Once | INTRAMUSCULAR | Status: AC
  Administered 2024-09-14: 155 [IU] via INTRAMUSCULAR

## 2024-09-14 NOTE — Progress Notes (Signed)
   BOTOX  PROCEDURE NOTE FOR MIGRAINE HEADACHE  HISTORY: Jennifer [name] is here for Botox . Last was 06/20/24 with me. This will be her last injection here, due to issue with insurance. Will need to refer out for Botox  injections. She has been to the ER 4 times since last Botox  for migraines, remains on Vyepti  300 mg. In August and Sept she had 10-12 migraines a month. Drastically changed her eating habits, has lost 16 lbs. Has gone 12 days now without a migraine. Last Vyepti  was August 2025, is due 10/10/24. She did see [facility], Dr. Darlis, having trigger point injection next week.   Description of procedure:  The patient was placed in a sitting position. The standard protocol was used for Botox  as follows, with 5 units of Botox  injected at each site:  -Procerus muscle, midline injection  -Corrugator muscle, bilateral injection  -Frontalis muscle, bilateral injection, with 2 sites each side, medial injection was performed in the upper one third of the frontalis muscle, in the region vertical from the medial inferior edge of the superior orbital rim. The lateral injection was again in the upper one third of the forehead vertically above the lateral limbus of the cornea, 1.5 cm lateral to the medial injection site.  -Temporalis muscle injection, 4 sites, bilaterally. The first injection was 3 cm above the tragus of the ear, second injection site was 1.5 cm to 3 cm up from the first injection site in line with the tragus of the ear. The third injection site was 1.5-3 cm forward between the first 2 injection sites. The fourth injection site was 1.5 cm posterior to the second injection site.  -Occipitalis muscle injection, 3 sites, bilaterally. The first injection was done one half way between the occipital protuberance and the tip of the mastoid process behind the ear. The second injection site was done lateral and superior to the first, 1 fingerbreadth from the first injection. The third  injection site was 1 fingerbreadth superiorly and medially from the first injection site.  -Cervical paraspinal muscle injection, 2 sites, bilateral, the first injection site was 1 cm from the midline of the cervical spine, 3 cm inferior to the lower border of the occipital protuberance. The second injection site was 1.5 cm superiorly and laterally to the first injection site.  -Trapezius muscle injection was performed at 3 sites, bilaterally. The first injection site was in the upper trapezius muscle halfway between the inflection point of the neck, and the acromion. The second injection site was one half way between the acromion and the first injection site. The third injection was done between the first injection site and the inflection point of the neck.   A 200 unit bottle of Botox  was used, 155 units were injected, the rest of the Botox  was wasted. The patient tolerated the procedure well, there were no complications of the above procedure.  Botox  NDC 9976-6078-97 Lot number [id number] Expiration date 08/2026 BB  Referral to [facility]. Would like to transition her care to [facility] since Dr. Ines is no longer here.

## 2024-09-14 NOTE — Progress Notes (Signed)
 Botox - 200 units x 1 vial Lot: [id number] Expiration: 08/2026 NDC: 9976-6078-97  Bacteriostatic 0.9% Sodium Chloride - 30 mL  Lot: [id number] Expiration: OCT-31-2026 NDC: 9590-8033-97  Dx: G43.009 B/B Witnessed by Maurilio Molt, RN

## 2024-09-14 NOTE — Telephone Encounter (Signed)
 Referral To Neurology Faxed to [facility]   [facility]  [phone] Fax: [phone]

## 2024-09-19 ENCOUNTER — Other Ambulatory Visit

## 2024-09-20 ENCOUNTER — Other Ambulatory Visit: Payer: Self-pay | Admitting: *Deleted

## 2024-09-20 DIAGNOSIS — D509 Iron deficiency anemia, unspecified: Secondary | ICD-10-CM

## 2024-09-20 DIAGNOSIS — M5481 Occipital neuralgia: Secondary | ICD-10-CM | POA: Diagnosis not present

## 2024-09-20 DIAGNOSIS — R519 Headache, unspecified: Secondary | ICD-10-CM | POA: Diagnosis not present

## 2024-09-20 DIAGNOSIS — M7918 Myalgia, other site: Secondary | ICD-10-CM | POA: Diagnosis not present

## 2024-09-20 DIAGNOSIS — G43909 Migraine, unspecified, not intractable, without status migrainosus: Secondary | ICD-10-CM | POA: Diagnosis not present

## 2024-09-20 NOTE — Progress Notes (Signed)
 Received call from pt with complaint of ongoing fatigue and unintentional weight loss.  Pt requesting iron  labs be drawn and f/u with MD.  Appts scheduled, pt educated and verbalized understanding.

## 2024-09-22 ENCOUNTER — Inpatient Hospital Stay: Attending: Hematology and Oncology

## 2024-09-22 DIAGNOSIS — Z1732 Human epidermal growth factor receptor 2 negative status: Secondary | ICD-10-CM | POA: Diagnosis not present

## 2024-09-22 DIAGNOSIS — E611 Iron deficiency: Secondary | ICD-10-CM | POA: Diagnosis not present

## 2024-09-22 DIAGNOSIS — Z170 Estrogen receptor positive status [ER+]: Secondary | ICD-10-CM | POA: Insufficient documentation

## 2024-09-22 DIAGNOSIS — Z1721 Progesterone receptor positive status: Secondary | ICD-10-CM | POA: Insufficient documentation

## 2024-09-22 DIAGNOSIS — Z79811 Long term (current) use of aromatase inhibitors: Secondary | ICD-10-CM | POA: Diagnosis not present

## 2024-09-22 DIAGNOSIS — C50211 Malignant neoplasm of upper-inner quadrant of right female breast: Secondary | ICD-10-CM | POA: Insufficient documentation

## 2024-09-22 DIAGNOSIS — D509 Iron deficiency anemia, unspecified: Secondary | ICD-10-CM

## 2024-09-22 DIAGNOSIS — Z923 Personal history of irradiation: Secondary | ICD-10-CM | POA: Insufficient documentation

## 2024-09-22 LAB — CBC WITH DIFFERENTIAL (CANCER CENTER ONLY)
Abs Immature Granulocytes: 0.02 K/uL (ref 0.00–0.07)
Basophils Absolute: 0 K/uL (ref 0.0–0.1)
Basophils Relative: 1 %
Eosinophils Absolute: 0 K/uL (ref 0.0–0.5)
Eosinophils Relative: 1 %
HCT: 39.5 % (ref 36.0–46.0)
Hemoglobin: 13.8 g/dL (ref 12.0–15.0)
Immature Granulocytes: 0 %
Lymphocytes Relative: 25 %
Lymphs Abs: 1.7 K/uL (ref 0.7–4.0)
MCH: 30.6 pg (ref 26.0–34.0)
MCHC: 34.9 g/dL (ref 30.0–36.0)
MCV: 87.6 fL (ref 80.0–100.0)
Monocytes Absolute: 0.5 K/uL (ref 0.1–1.0)
Monocytes Relative: 7 %
Neutro Abs: 4.6 K/uL (ref 1.7–7.7)
Neutrophils Relative %: 66 %
Platelet Count: 304 K/uL (ref 150–400)
RBC: 4.51 MIL/uL (ref 3.87–5.11)
RDW: 12.8 % (ref 11.5–15.5)
WBC Count: 6.9 K/uL (ref 4.0–10.5)
nRBC: 0 % (ref 0.0–0.2)

## 2024-09-22 LAB — CMP (CANCER CENTER ONLY)
ALT: 14 U/L (ref 0–44)
AST: 16 U/L (ref 15–41)
Albumin: 4.5 g/dL (ref 3.5–5.0)
Alkaline Phosphatase: 61 U/L (ref 38–126)
Anion gap: 6 (ref 5–15)
BUN: 15 mg/dL (ref 8–23)
CO2: 28 mmol/L (ref 22–32)
Calcium: 9.6 mg/dL (ref 8.9–10.3)
Chloride: 102 mmol/L (ref 98–111)
Creatinine: 0.68 mg/dL (ref 0.44–1.00)
GFR, Estimated: >60 mL/min (ref >60)
Glucose, Bld: 101 mg/dL — ABNORMAL HIGH (ref 70–99)
Potassium: 4.1 mmol/L (ref 3.5–5.1)
Sodium: 136 mmol/L (ref 135–145)
Total Bilirubin: 0.6 mg/dL (ref 0.0–1.2)
Total Protein: 7.6 g/dL (ref 6.5–8.1)

## 2024-09-22 LAB — IRON AND IRON BINDING CAPACITY (CC-WL,HP ONLY)
Iron: 129 ug/dL (ref 28–170)
Saturation Ratios: 27 % (ref 10.4–31.8)
TIBC: 477 ug/dL — ABNORMAL HIGH (ref 250–450)
UIBC: 348 ug/dL

## 2024-09-22 LAB — VITAMIN B12: Vitamin B-12: 797 pg/mL (ref 180–914)

## 2024-09-22 LAB — FERRITIN: Ferritin: 50 ng/mL (ref 11–307)

## 2024-09-25 ENCOUNTER — Encounter: Payer: Self-pay | Admitting: Radiology

## 2024-09-26 ENCOUNTER — Inpatient Hospital Stay: Attending: Hematology and Oncology | Admitting: Hematology and Oncology

## 2024-09-26 ENCOUNTER — Ambulatory Visit: Admitting: Podiatry

## 2024-09-26 VITALS — BP 98/80 | HR 57 | Temp 97.9°F | Resp 18 | Ht 59.0 in | Wt 116.2 lb

## 2024-09-26 DIAGNOSIS — Z1721 Progesterone receptor positive status: Secondary | ICD-10-CM | POA: Diagnosis not present

## 2024-09-26 DIAGNOSIS — Z1732 Human epidermal growth factor receptor 2 negative status: Secondary | ICD-10-CM | POA: Diagnosis not present

## 2024-09-26 DIAGNOSIS — C50211 Malignant neoplasm of upper-inner quadrant of right female breast: Secondary | ICD-10-CM | POA: Diagnosis not present

## 2024-09-26 DIAGNOSIS — Q667 Congenital pes cavus, unspecified foot: Secondary | ICD-10-CM | POA: Diagnosis not present

## 2024-09-26 DIAGNOSIS — M722 Plantar fascial fibromatosis: Secondary | ICD-10-CM | POA: Diagnosis not present

## 2024-09-26 DIAGNOSIS — R634 Abnormal weight loss: Secondary | ICD-10-CM | POA: Insufficient documentation

## 2024-09-26 DIAGNOSIS — G43909 Migraine, unspecified, not intractable, without status migrainosus: Secondary | ICD-10-CM | POA: Insufficient documentation

## 2024-09-26 DIAGNOSIS — Z170 Estrogen receptor positive status [ER+]: Secondary | ICD-10-CM | POA: Diagnosis not present

## 2024-09-26 DIAGNOSIS — Z923 Personal history of irradiation: Secondary | ICD-10-CM | POA: Diagnosis not present

## 2024-09-26 DIAGNOSIS — M810 Age-related osteoporosis without current pathological fracture: Secondary | ICD-10-CM | POA: Insufficient documentation

## 2024-09-26 DIAGNOSIS — E611 Iron deficiency: Secondary | ICD-10-CM | POA: Diagnosis not present

## 2024-09-26 DIAGNOSIS — Z79811 Long term (current) use of aromatase inhibitors: Secondary | ICD-10-CM | POA: Insufficient documentation

## 2024-09-26 NOTE — Assessment & Plan Note (Signed)
 10/10/2018: Right lumpectomy: IDC grade 1, 1.2 cm, with intermediate grade DCIS, margins negative, 0/2 lymph nodes negative, T1CN0 stage Ia ER 100%, PR 90%, Ki-67 10%, HER-2 1+ negative  Oncotype DX: 29: 18% risk of recurrence    Treatment plan:  1. Recommended systemic adjuvant chemotherapy with Taxotere and Cytoxan every 3 weeks x4 cycles (patient refused)  2. Adjuvant radiation therapy 11/08/2018-12/05/2018  3. Adjuvant antiestrogen therapy with anastrozole  started 12/05/2018 switched to letrozole  10/09/19    Letrozole  toxicities: Denies any adverse effects to letrozole    Breast cancer surveillance: 1.  Mammogram 02/22/2024: Benign, breast density category D: Because of high breast density recommended contrast-enhanced mammograms for annual surveillance 2. bone density 11/08/2023: T score -2.6: Osteoporosis 3.  Breast exam 09/26/2024: Benign   Osteoporosis discussion: We recommended holding today's Prolia  injection because she is having some dental issues and has a need to undergo either dental extraction as well as implant placement. Bone density has been scheduled for November 08, 2024  We are likely to reinitiate her bisphosphonate therapy when she comes back in December after the bone density test.   Return to clinic every 6 months for Prolia  and 1 year for follow-up with me

## 2024-09-26 NOTE — Progress Notes (Signed)
 Subjective:  Patient ID: Jennifer [name], female    DOB: 12/02/1955,  MRN: [id number]  Chief Complaint  Patient presents with   Plantar Fasciitis    68 y.o. female presents with the above complaint.  Patient presents for follow-up of left plantar fasciitis she states is doing a lot better.  She still has some residual pain would like to discuss next treatment plan she does not wear any orthotic she would like to discuss that as an option as well.  Review of Systems: Negative except as noted in the HPI. Denies N/V/F/Ch.  Past Medical History:  Diagnosis Date   Allergic rhinitis    Allergy    seasonal   Anemia    Anxiety    Arthritis    hands, knees   Asthma    as a child   Blood transfusion without reported diagnosis    as a child   Breast cancer (HCC) 2019   Right Breast Cancer   Cancer (HCC) 09/2018   Breast CA new DX, right breast   Cervical dysplasia    Common bile duct dilation    Diabetes mellitus without complication (HCC)    Endometriosis    Esophageal reflux    Family history of adverse reaction to anesthesia    younger sister anxiety for a dew days after   Family history of breast cancer    Family history of breast cancer    Family history of prostate cancer    Hepatic cyst 08/2020   multiple   Hepatic hemangioma 08/2020   intrahepatic hemangioma   Hiatal hernia    HSV-1 (herpes simplex virus 1) infection    Hyperlipidemia    Hypertension    pt.denies 10/19/22   Hypothyroid    Migraine with aura, without mention of intractable migraine without mention of status migrainosus    Osteopenia 08/2019   T score -2.2 FRAX 11% / 1.7%   Osteoporosis 05/07/2008   Qualifier: Diagnosis of  By: Randeen MD, Laine Caldron    Personal history of radiation therapy 2019   Right Breast Cancer   Pre-diabetes    Raynaud's disease     Current Outpatient Medications:    ALPRAZolam  (XANAX ) 0.5 MG tablet, TAKE 1/2 TO 1 TABLET BY MOUTH ONCE DAILY AS NEEDED FOR SLEEP/ANXIETY,  Disp: 15 tablet, Rfl: 2   atenolol  (TENORMIN ) 25 MG tablet, Take 1 tablet (25 mg total) by mouth daily. For migraine prevention, Disp: 90 tablet, Rfl: 4   B Complex Vitamins (B COMPLEX PO), Take by mouth., Disp: , Rfl:    baclofen  (LIORESAL ) 10 MG tablet, Take 1 tablet (10 mg total) by mouth 3 (three) times daily., Disp: 30 each, Rfl: 0   botulinum toxin Type A  (BOTOX ) 200 units injection, Inject 200 Units into the muscle every 3 (three) months. Provider to inject botox  in head and neck muscle every 3 months disgard remaimimg, Disp: 1 each, Rfl: 2   CALCIUM  CARBONATE-VITAMIN D  PO, Take 1 tablet by mouth daily. 1000 mg, Disp: , Rfl:    Coenzyme Q10 (CO Q 10 PO), Take 1 capsule by mouth daily., Disp: , Rfl:    denosumab  (PROLIA ) 60 MG/ML SOSY injection, Inject 60 mg into the skin every 6 (six) months. (Patient not taking: Reported on 09/26/2024), Disp: , Rfl:    Eptinezumab -jjmr (VYEPTI  IV), Inject into the vein every 3 (three) months. (Patient not taking: Reported on 09/26/2024), Disp: , Rfl:    famotidine  (PEPCID ) 40 MG tablet, Take 40 mg by mouth  2 (two) times daily. , Disp: , Rfl:    hydrocortisone  2.5 % cream, Apply to affected areas BID PRN as directed. (Patient not taking: Reported on 09/26/2024), Disp: 30 g, Rfl: 3   hydrOXYzine  (ATARAX ) 25 MG tablet, Take 37.5 mg by mouth at bedtime as needed., Disp: , Rfl:    ketorolac  (TORADOL ) 10 MG tablet, Take 1 tablet (10 mg total) by mouth every 8 (eight) hours as needed. For migraine., Disp: 20 tablet, Rfl: 11   letrozole  (FEMARA ) 2.5 MG tablet, TAKE 1 TABLET BY MOUTH EVERY DAY, Disp: 90 tablet, Rfl: 3   levocetirizine (XYZAL) 5 MG tablet, Take 5 mg by mouth at bedtime. , Disp: , Rfl: 3   levothyroxine  (SYNTHROID ) 75 MCG tablet, TAKE 1 TABLET BY MOUTH EVERY DAY BEFORE BREAKFAST, Disp: 90 tablet, Rfl: 2   Magnesium  Oxide 400 (240 Mg) MG TABS, Take 400 mg by mouth daily. , Disp: , Rfl:    metFORMIN  (GLUCOPHAGE -XR) 500 MG 24 hr tablet, Take 1 tablet (500 mg  total) by mouth daily with breakfast., Disp: 90 tablet, Rfl: 3   Multiple Vitamin (MULTIVITAMIN WITH MINERALS) TABS tablet, Take 1 tablet by mouth daily., Disp: , Rfl:    naratriptan  (AMERGE) 2.5 MG tablet, Take 1 tablet (2.5 mg total) by mouth as needed for migraine. Take one (1) tablet at onset of headache; if returns or does not resolve, may repeat after 4 hours; do not exceed five (5) mg in 24 hours., Disp: 12 tablet, Rfl: 11   neomycin-polymyxin b-dexamethasone  (MAXITROL) 3.5-10000-0.1 SUSP, 1 drop 3 (three) times daily., Disp: , Rfl:    Omega-3 Fatty Acids (FISH OIL) 1000 MG CAPS, Take 1,000 mg by mouth daily. , Disp: , Rfl:    OnabotulinumtoxinA  (BOTOX  IJ), Inject 1 Dose as directed every 6 (six) weeks. , Disp: , Rfl:    ondansetron  (ZOFRAN  ODT) 4 MG disintegrating tablet, Take 1 tablet (4 mg total) by mouth every 8 (eight) hours as needed., Disp: 20 tablet, Rfl: 0   pantoprazole  (PROTONIX ) 20 MG tablet, TAKE 1 TABLET BY MOUTH EVERY DAY, Disp: 90 tablet, Rfl: 1   PRESCRIPTION MEDICATION, Inject into the skin every 6 (six) weeks. Steroid Trigger, Disp: , Rfl:    Probiotic Product (PROBIOTIC-10 PO), Take 1 capsule by mouth daily. , Disp: , Rfl:    promethazine  (PHENERGAN ) 25 MG tablet, Take 25 mg by mouth daily as needed., Disp: , Rfl:    triamcinolone  cream (KENALOG ) 0.1 %, Apply to affected areas BID PRN for up to two weeks at a time as directed., Disp: 30 g, Rfl: 1   TURMERIC PO, Take 1 tablet by mouth daily., Disp: , Rfl:    Ubrogepant  (UBRELVY ) 100 MG TABS, Take 1 tablet (100 mg total) by mouth every 2 (two) hours as needed. Maximum 200mg  a day., Disp: 16 tablet, Rfl: 11   Ubrogepant  (UBRELVY ) 100 MG TABS, Take 1 tablet onset migraine , may repeat in 2 hrs if needed. (Max 2/ 24 hrs), Disp: 6 tablet, Rfl: 0   VITAMIN D  PO, Take 1 capsule by mouth daily., Disp: , Rfl:   Social History   Tobacco Use  Smoking Status Never   Passive exposure: Never  Smokeless Tobacco Never    Allergies   Allergen Reactions   Ciprofloxacin Other (See Comments)    Dizziness    Codeine Phosphate Other (See Comments)    Dizziness, heart palpitations, nervous   Decongestant [Pseudoephedrine] Other (See Comments)    dizziness   Diclofenac  Diphenhydramine Hcl (Sleep)    Flonase  [Fluticasone  Propionate] Other (See Comments)    Worsens her migraine    Fluticasone     Hydrocodone  Other (See Comments)    Made nervous--10/09 surgery rotator cuff   Macrobid  [Nitrofurantoin ]     headaches   Nasal Spray    Nasonex  [Mometasone ]     Migraines   Reglan [Metoclopramide] Other (See Comments)    Dizziness   Topamax [Topiramate] Other (See Comments)    Dizziness    Benadryl [Diphenhydramine Hcl] Palpitations   Sulfa Antibiotics Anxiety   Objective:  There were no vitals filed for this visit. There is no height or weight on file to calculate BMI. Constitutional Well developed. Well nourished.  Vascular Dorsalis pedis pulses palpable bilaterally. Posterior tibial pulses palpable bilaterally. Capillary refill normal to all digits.  No cyanosis or clubbing noted. Pedal hair growth normal.  Neurologic Normal speech. Oriented to person, place, and time. Epicritic sensation to light touch grossly present bilaterally.  Dermatologic Nails well groomed and normal in appearance. No open wounds. No skin lesions.  Orthopedic: Normal joint ROM without pain or crepitus bilaterally. No visible deformities. Tender to palpation at the calcaneal tuber left. No pain with calcaneal squeeze left. Ankle ROM diminished range of motion left. Silfverskiold Test: positive left.   Radiographs: None  Assessment:   1. Plantar fasciitis of left foot   2. Pes cavus     Plan:  Patient was evaluated and treated and all questions answered.  Plantar Fasciitis, left - XR reviewed as above.  - Educated on icing and stretching. Instructions given.  - Second injection delivered to the plantar fascia as  below. - DME: Plantar fascial brace dispensed to support the medial longitudinal arch of the foot and offload pressure from the heel and prevent arch collapse during weightbearing - Pharmacologic management: None  Pes cavus/foot deformity -I explained to patient the etiology of pes planovalgus and relationship with heel pain/arch pain and various treatment options were discussed.  Given patient foot structure in the setting of heel pain/arch pain I believe patient will benefit from custom-made orthotics to help control the hindfoot motion support the arch of the foot and take the stress away from arches.  Patient agrees with the plan like to proceed with orthotics -Patient was casted for orthotics   Procedure: Injection Tendon/Ligament Location: Left plantar fascia at the glabrous junction; medial approach. Skin Prep: alcohol Injectate: 0.5 cc 0.5% marcaine  plain, 0.5 cc of 1% Lidocaine , 0.5 cc kenalog  10. Disposition: Patient tolerated procedure well. Injection site dressed with a band-aid.  No follow-ups on file.

## 2024-09-26 NOTE — Progress Notes (Signed)
 Patient Care Team: Tower, Laine LABOR, MD as PCP - General (Family Medicine) Odean Potts, MD as Consulting Physician (Hematology and Oncology) Dewey Rush, MD as Consulting Physician (Radiation Oncology) Curvin Deward MOULD, MD as Consulting Physician (General Surgery)  DIAGNOSIS:  Encounter Diagnosis  Name Primary?   Malignant neoplasm of upper-inner quadrant of right breast in female, estrogen receptor positive (HCC) Yes    SUMMARY OF ONCOLOGIC HISTORY: Oncology History  Malignant neoplasm of upper-inner quadrant of right breast in female, estrogen receptor positive (HCC)  09/13/2018 Initial Diagnosis   Diagnostic mammogram detected right breast mass with distortion LIQ at 2:00 9 cm from nipple 1.7 cm, at 1:00 there was a benign 1.2 cm fibrocystic change, biopsy of the 2:00 mass revealed grade 1 IDC with DCIS ER 100%, PR 90%, Ki-67 10%, HER-2 1+ negative, T1CN0 stage Ia clinical stage   10/10/2018 Surgery   Right lumpectomy: IDC grade 1, 1.2 cm, with intermediate grade DCIS, margins negative, 0/2 lymph nodes negative, T1CN0 stage Ia ER 100%, PR 90%, Ki-67 10%, HER-2 1+ negative    10/10/2018 Oncotype testing   oncotype results of 29: Risk of recurrence without chemo 18%   11/07/2018 - 12/08/2018 Radiation Therapy   1. Right Breast / 42.56 Gy in 16 fractions 2. Right Breast Boost / 8 Gy in 4 fractions - Total dose 50.56 Gy   12/2018 -  Anti-estrogen oral therapy   Anastrozole  daily, planned for 7 years   11/11/2019 Genetic Testing   Negative genetic testing on the common hereditary cancer panel.  The Common Hereditary Gene Panel offered by [facility] includes sequencing and/or deletion duplication testing of the following 48 genes: APC, ATM, AXIN2, BARD1, BMPR1A, BRCA1, BRCA2, BRIP1, CDH1, CDK4, CDKN2A (p14ARF), CDKN2A (p16INK4a), CHEK2, CTNNA1, DICER1, EPCAM (Deletion/duplication testing only), GREM1 (promoter region deletion/duplication testing only), KIT, MEN1, MLH1, MSH2, MSH3, MSH6,  MUTYH, NBN, NF1, NHTL1, PALB2, PDGFRA, PMS2, POLD1, POLE, PTEN, RAD50, RAD51C, RAD51D, RNF43, SDHB, SDHC, SDHD, SMAD4, SMARCA4. STK11, TP53, TSC1, TSC2, and VHL.  The following genes were evaluated for sequence changes only: SDHA and HOXB13 c.251G>A variant only. The report date is 11/11/2019     CHIEF COMPLIANT: Surveillance of anemia, history of breast cancer  HISTORY OF PRESENT ILLNESS:   History of Present Illness Jennifer [name] is a 68 year old female who presents with significant weight loss and concerns about her migraines.  She has lost approximately 17 pounds since May, initially due to stress, but continues to lose weight with a decreased appetite. She eats only out of necessity and can now go without eating without triggering migraines.  She expresses concern about her iron  levels, with a past history of anemia. Recent labs show elevated TIBC with normal ferritin levels. She experiences fatigue, particularly in the evening.  She is currently taking letrozole  and has been on it for six years. At age 25, she was prescribed Inderal for anxiety-related weight loss, which helped regain her appetite.     ALLERGIES:  is allergic to ciprofloxacin, codeine phosphate, decongestant [pseudoephedrine], diclofenac, diphenhydramine hcl (sleep), flonase  [fluticasone  propionate], fluticasone , hydrocodone , macrobid  [nitrofurantoin ], nasal spray, nasonex  [mometasone ], reglan [metoclopramide], topamax [topiramate], benadryl [diphenhydramine hcl], and sulfa antibiotics.  MEDICATIONS:  Current Outpatient Medications  Medication Sig Dispense Refill   ALPRAZolam  (XANAX ) 0.5 MG tablet TAKE 1/2 TO 1 TABLET BY MOUTH ONCE DAILY AS NEEDED FOR SLEEP/ANXIETY 15 tablet 2   atenolol  (TENORMIN ) 25 MG tablet Take 1 tablet (25 mg total) by mouth daily. For migraine prevention 90 tablet  4   B Complex Vitamins (B COMPLEX PO) Take by mouth.     baclofen  (LIORESAL ) 10 MG tablet Take 1 tablet (10 mg total) by mouth 3  (three) times daily. 30 each 0   botulinum toxin Type A  (BOTOX ) 200 units injection Inject 200 Units into the muscle every 3 (three) months. Provider to inject botox  in head and neck muscle every 3 months disgard remaimimg 1 each 2   CALCIUM  CARBONATE-VITAMIN D  PO Take 1 tablet by mouth daily. 1000 mg     Coenzyme Q10 (CO Q 10 PO) Take 1 capsule by mouth daily.     famotidine  (PEPCID ) 40 MG tablet Take 40 mg by mouth 2 (two) times daily.      hydrOXYzine  (ATARAX ) 25 MG tablet Take 37.5 mg by mouth at bedtime as needed.     ketorolac  (TORADOL ) 10 MG tablet Take 1 tablet (10 mg total) by mouth every 8 (eight) hours as needed. For migraine. 20 tablet 11   letrozole  (FEMARA ) 2.5 MG tablet TAKE 1 TABLET BY MOUTH EVERY DAY 90 tablet 3   levocetirizine (XYZAL) 5 MG tablet Take 5 mg by mouth at bedtime.   3   levothyroxine  (SYNTHROID ) 75 MCG tablet TAKE 1 TABLET BY MOUTH EVERY DAY BEFORE BREAKFAST 90 tablet 2   Magnesium  Oxide 400 (240 Mg) MG TABS Take 400 mg by mouth daily.      metFORMIN  (GLUCOPHAGE -XR) 500 MG 24 hr tablet Take 1 tablet (500 mg total) by mouth daily with breakfast. 90 tablet 3   Multiple Vitamin (MULTIVITAMIN WITH MINERALS) TABS tablet Take 1 tablet by mouth daily.     naratriptan  (AMERGE) 2.5 MG tablet Take 1 tablet (2.5 mg total) by mouth as needed for migraine. Take one (1) tablet at onset of headache; if returns or does not resolve, may repeat after 4 hours; do not exceed five (5) mg in 24 hours. 12 tablet 11   neomycin-polymyxin b-dexamethasone  (MAXITROL) 3.5-10000-0.1 SUSP 1 drop 3 (three) times daily.     Omega-3 Fatty Acids (FISH OIL) 1000 MG CAPS Take 1,000 mg by mouth daily.      OnabotulinumtoxinA  (BOTOX  IJ) Inject 1 Dose as directed every 6 (six) weeks.      ondansetron  (ZOFRAN  ODT) 4 MG disintegrating tablet Take 1 tablet (4 mg total) by mouth every 8 (eight) hours as needed. 20 tablet 0   pantoprazole  (PROTONIX ) 20 MG tablet TAKE 1 TABLET BY MOUTH EVERY DAY 90 tablet 1    PRESCRIPTION MEDICATION Inject into the skin every 6 (six) weeks. Steroid Trigger     Probiotic Product (PROBIOTIC-10 PO) Take 1 capsule by mouth daily.      promethazine  (PHENERGAN ) 25 MG tablet Take 25 mg by mouth daily as needed.     triamcinolone  cream (KENALOG ) 0.1 % Apply to affected areas BID PRN for up to two weeks at a time as directed. 30 g 1   Ubrogepant  (UBRELVY ) 100 MG TABS Take 1 tablet (100 mg total) by mouth every 2 (two) hours as needed. Maximum 200mg  a day. 16 tablet 11   VITAMIN D  PO Take 1 capsule by mouth daily.     denosumab  (PROLIA ) 60 MG/ML SOSY injection Inject 60 mg into the skin every 6 (six) months. (Patient not taking: Reported on 09/26/2024)     Eptinezumab -jjmr (VYEPTI  IV) Inject into the vein every 3 (three) months. (Patient not taking: Reported on 09/26/2024)     hydrocortisone  2.5 % cream Apply to affected areas BID PRN as  directed. (Patient not taking: Reported on 09/26/2024) 30 g 3   TURMERIC PO Take 1 tablet by mouth daily.     Ubrogepant  (UBRELVY ) 100 MG TABS Take 1 tablet onset migraine , may repeat in 2 hrs if needed. (Max 2/ 24 hrs) 6 tablet 0   No current facility-administered medications for this visit.    PHYSICAL EXAMINATION: ECOG PERFORMANCE STATUS: 1 - Symptomatic but completely ambulatory  Vitals:   09/26/24 1108  BP: 98/80  Pulse: (!) 57  Resp: 18  Temp: 97.9 F (36.6 C)  SpO2: 100%   Filed Weights   09/26/24 1108  Weight: 116 lb 3.2 oz (52.7 kg)      LABORATORY DATA:  I have reviewed the data as listed    Latest Ref Rng & Units 09/22/2024   11:25 AM 07/16/2024   11:39 AM 06/12/2024    2:01 PM  CMP  Glucose 70 - 99 mg/dL 898  87  76   BUN 8 - 23 mg/dL 15  16  16    Creatinine 0.44 - 1.00 mg/dL 9.31  9.45  9.28   Sodium 135 - 145 mmol/L 136  139  137   Potassium 3.5 - 5.1 mmol/L 4.1  3.7  4.3   Chloride 98 - 111 mmol/L 102  104  101   CO2 22 - 32 mmol/L 28  25  31    Calcium  8.9 - 10.3 mg/dL 9.6  9.3  9.6   Total Protein 6.5 -  8.1 g/dL 7.6  7.5  7.1   Total Bilirubin 0.0 - 1.2 mg/dL 0.6  0.7  0.5   Alkaline Phos 38 - 126 U/L 61  59  61   AST 15 - 41 U/L 16  20  13    ALT 0 - 44 U/L 14  17  11      Lab Results  Component Value Date   WBC 6.9 09/22/2024   HGB 13.8 09/22/2024   HCT 39.5 09/22/2024   MCV 87.6 09/22/2024   PLT 304 09/22/2024   NEUTROABS 4.6 09/22/2024    ASSESSMENT & PLAN:  Malignant neoplasm of upper-inner quadrant of right breast in female, estrogen receptor positive (HCC) 10/10/2018: Right lumpectomy: IDC grade 1, 1.2 cm, with intermediate grade DCIS, margins negative, 0/2 lymph nodes negative, T1CN0 stage Ia ER 100%, PR 90%, Ki-67 10%, HER-2 1+ negative  Oncotype DX: 29: 18% risk of recurrence    Treatment plan:  1. Recommended systemic adjuvant chemotherapy with Taxotere and Cytoxan every 3 weeks x4 cycles (patient refused)  2. Adjuvant radiation therapy 11/08/2018-12/05/2018  3. Adjuvant antiestrogen therapy with anastrozole  started 12/05/2018 switched to letrozole  10/09/19    Letrozole  toxicities: Denies any adverse effects to letrozole    Breast cancer surveillance: 1.  Mammogram 02/22/2024: Benign, breast density category D: Because of high breast density recommended contrast-enhanced mammograms for annual surveillance 2. bone density 11/08/2023: T score -2.6: Osteoporosis 3.  Breast exam 09/26/2024: Benign   Osteoporosis: Patient will come in for Prolia  injection with labs in a month Bone density has been scheduled for November 08, 2024     Return to clinic every 6 months for Prolia  and 1 year for follow-up with me   Assessment & Plan Malignant neoplasm of right breast, upper-inner quadrant Continues on adjuvant letrozole  therapy with no new breast symptoms. Recent mammogram was normal. - Continue letrozole  therapy. - Perform breast exam today.  Osteoporosis Management with Prolia  was delayed due to dental issues. Bone density assessment is scheduled for next month. -  Review  bone density results next month. - Consider resuming Prolia  after dental issues are resolved.  Unintentional weight loss Weight loss of approximately 17 pounds since May, attributed to stress and decreased appetite. No significant concern from recent lab results regarding iron  levels.  Mild iron  deficiency Slightly elevated TIBC, indicating mild iron  deficiency, but not clinically significant. Ferritin levels are normal.  Migraine Improved frequency with ongoing preventive therapy. Significant reduction in migraine frequency since October, possibly due to Vyepti  infusions.      No orders of the defined types were placed in this encounter.  The patient has a good understanding of the overall plan. she agrees with it. she will call with any problems that may develop before the next visit here.  I personally spent a total of 30 minutes in the care of the patient today including preparing to see the patient, getting/reviewing separately obtained history, performing a medically appropriate exam/evaluation, counseling and educating, placing orders, referring and communicating with other health care professionals, documenting clinical information in the EHR, independently interpreting results, communicating results, and coordinating care.   Viinay K Imunique Samad, MD 09/26/24

## 2024-10-10 DIAGNOSIS — G43711 Chronic migraine without aura, intractable, with status migrainosus: Secondary | ICD-10-CM | POA: Diagnosis not present

## 2024-11-02 ENCOUNTER — Ambulatory Visit: Admitting: Family Medicine

## 2024-11-02 ENCOUNTER — Other Ambulatory Visit: Payer: Self-pay

## 2024-11-02 ENCOUNTER — Emergency Department
Admission: EM | Admit: 2024-11-02 | Discharge: 2024-11-02 | Disposition: A | Discharge status: Home / Self Care | Attending: Emergency Medicine | Admitting: Emergency Medicine

## 2024-11-02 DIAGNOSIS — G43001 Migraine without aura, not intractable, with status migrainosus: Secondary | ICD-10-CM | POA: Insufficient documentation

## 2024-11-02 DIAGNOSIS — E119 Type 2 diabetes mellitus without complications: Secondary | ICD-10-CM | POA: Diagnosis not present

## 2024-11-02 DIAGNOSIS — G43909 Migraine, unspecified, not intractable, without status migrainosus: Secondary | ICD-10-CM | POA: Diagnosis present

## 2024-11-02 MED ORDER — SODIUM CHLORIDE 0.9 % IV BOLUS
1000.0000 mL | Freq: Once | INTRAVENOUS | Status: AC
  Administered 2024-11-02: 1000 mL via INTRAVENOUS

## 2024-11-02 MED ORDER — ONDANSETRON HCL 4 MG/2ML IJ SOLN
4.0000 mg | Freq: Once | INTRAMUSCULAR | Status: AC
  Administered 2024-11-02: 4 mg via INTRAVENOUS
  Filled 2024-11-02: qty 2

## 2024-11-02 MED ORDER — DEXAMETHASONE SOD PHOSPHATE PF 10 MG/ML IJ SOLN
10.0000 mg | Freq: Once | INTRAMUSCULAR | Status: AC
  Administered 2024-11-02: 10 mg via INTRAVENOUS

## 2024-11-02 MED ORDER — KETOROLAC TROMETHAMINE 15 MG/ML IJ SOLN
15.0000 mg | Freq: Once | INTRAMUSCULAR | Status: AC
  Administered 2024-11-02: 15 mg via INTRAVENOUS
  Filled 2024-11-02: qty 1

## 2024-11-02 NOTE — ED Provider Notes (Signed)
 [facility] Provider Note    Event Date/Time   First MD Initiated Contact with Patient 11/02/24 1032     (approximate)   History   Migraine   HPI  Jennifer [name] is a 68 y.o. female history of chronic migraines, diabetes, multiple other past medical problems, saying her medical history, presents emergency department with worsening chronic migraine.  States she has used her medications.  States when she gets like this she has to come to the ER and get Decadron , Toradol , and Zofran .  States then she will feel better for a while.  She does have an appointment to follow-up with her neurologist.  States the headache is not different than any of her others.  No thunderclap no sudden onset etc.  Positive sensitivity to light, some nausea, no vomiting.      Physical Exam   Triage Vital Signs: ED Triage Vitals  Encounter Vitals Group     BP 11/02/24 0925 130/75     Girls Systolic BP Percentile --      Girls Diastolic BP Percentile --      Boys Systolic BP Percentile --      Boys Diastolic BP Percentile --      Pulse Rate 11/02/24 0925 61     Resp 11/02/24 0925 17     Temp 11/02/24 0925 98.6 F (37 C)     Temp src --      SpO2 11/02/24 0925 98 %     Weight 11/02/24 0938 112 lb (50.8 kg)     Height 11/02/24 0938 4' 11 (1.499 m)     Head Circumference --      Peak Flow --      Pain Score 11/02/24 0936 3     Pain Loc --      Pain Education --      Exclude from Growth Chart --     Most recent vital signs: Vitals:   11/02/24 0925 11/02/24 1058  BP: 130/75   Pulse: 61   Resp: 17   Temp: 98.6 F (37 C)   SpO2: 98% 98%     General: Awake, no distress.   CV:  Good peripheral perfusion.  Resp:  Normal effort. Abd:  No distention.   Other:  PERRL, EOMI, cranial nerves II through XII grossly intact   ED Results / Procedures / Treatments   Labs (all labs ordered are listed, but only abnormal results are displayed) Labs Reviewed - No data to  display   EKG     RADIOLOGY     PROCEDURES:   Procedures  Critical Care:  no Chief Complaint  Patient presents with   Migraine      MEDICATIONS ORDERED IN ED: Medications  sodium chloride  0.9 % bolus 1,000 mL (0 mLs Intravenous Stopped 11/02/24 1235)  dexamethasone  (DECADRON ) injection 10 mg (10 mg Intravenous Given 11/02/24 1106)  ondansetron  (ZOFRAN ) injection 4 mg (4 mg Intravenous Given 11/02/24 1104)  ketorolac  (TORADOL ) 15 MG/ML injection 15 mg (15 mg Intravenous Given 11/02/24 1105)     IMPRESSION / MDM / ASSESSMENT AND PLAN / ED COURSE  I reviewed the triage vital signs and the nursing notes.                              Differential diagnosis includes, but is not limited to, chronic migraine exasperation, subdural, SAH, CVA  Patient's presentation is most consistent with acute illness /  injury with system symptoms.   Medications given: Decadron  10 mg IV, Toradol  15 mg IV, Zofran  4 mg IV, normal saline 1 L IV  No red flags to warrant imaging today.  Appears to be more of chronic migraine.  Feel that the subdural, SAH and CVA less likely.  Patient was given the above medications.  States she is feeling better.  Will discharge to home as there are no red flags for admission.  She is to follow-up with her neurologist.  Her regular doctor.  Return emergency department worsening.  She is in agreement treatment plan.  Discharged stable condition.      FINAL CLINICAL IMPRESSION(S) / ED DIAGNOSES   Final diagnoses:  Migraine without aura and with status migrainosus, not intractable     Rx / DC Orders   ED Discharge Orders     None        Note:  This document was prepared using Dragon voice recognition software and may include unintentional dictation errors.    Gasper Devere ORN, PA-C 11/02/24 1250    Waymond Lorelle Cummins, MD 11/02/24 1314

## 2024-11-02 NOTE — Discharge Instructions (Signed)
 Follow-up with your regular doctor as needed.  Return for worsening.  Continue to drink plenty of fluids

## 2024-11-02 NOTE — ED Triage Notes (Signed)
 Pt arrives via POV with c/o migraine since Sunday. Pt took their triptan every day since Monday and it's not working. Pt endorses slight nausea. Pt is A&Ox4 during triage.

## 2024-11-03 ENCOUNTER — Ambulatory Visit (INDEPENDENT_AMBULATORY_CARE_PROVIDER_SITE_OTHER): Admitting: Family Medicine

## 2024-11-03 ENCOUNTER — Encounter: Payer: Self-pay | Admitting: Family Medicine

## 2024-11-03 VITALS — BP 126/72 | HR 67 | Temp 98.1°F | Ht 59.0 in | Wt 118.5 lb

## 2024-11-03 DIAGNOSIS — H9201 Otalgia, right ear: Secondary | ICD-10-CM | POA: Insufficient documentation

## 2024-11-03 MED ORDER — PREDNISONE 20 MG PO TABS: 20.0000 mg | ORAL_TABLET | Freq: Every day | ORAL | 0 refills | Status: DC

## 2024-11-03 NOTE — Progress Notes (Signed)
 Subjective:    Patient ID: Jennifer [name], female    DOB: Aug 08, 1956, 68 y.o.   MRN: [id number]  HPI  Wt Readings from Last 3 Encounters:  11/03/24 118 lb 8 oz (53.8 kg)  11/02/24 112 lb (50.8 kg)  09/26/24 116 lb 3.2 oz (52.7 kg)   23.93 kg/m  Vitals:   11/03/24 1132  BP: 126/72  Pulse: 67  Temp: 98.1 F (36.7 C)  SpO2: 97%    Pt presents with c/o Ear pain   A little ear ache in right ear past several weeks  A little tender to touch / feels funny under ear also  Worse this week   No drainage  No problems hearing out of it  She does use q tips  No swimming or water in it   No nasal sprays-thought flonase  triggered migraine   No congestion  No sore throat  Occational cough if she is in warm/dry air      Did test for covid and flu       Did have ER visit yesterday for migraine (got out of regular schedule with the holidays) Decadron /toradol /zofran   Then took toradol  last night for a 2nd dose -took until 4:30 am for headache to go away  Is tired today       Patient Active Problem List   Diagnosis Date Noted   Right ear pain 11/03/2024   Weight loss 07/12/2024   Left knee pain 05/30/2024   Cerumen impaction 11/10/2023   Current use of proton pump inhibitor 09/12/2023   Chronic migraine without aura, with intractable migraine, so stated, with status migrainosus 07/29/2023   Migraine without aura and without status migrainosus, not intractable 07/29/2023   Thoracic back pain 01/21/2023   Pain in right shoulder 08/12/2022   Raynaud's disease 03/17/2022   Iron  deficiency anemia 09/08/2020   Common bile duct dilatation 09/06/2020   History of breast cancer 03/01/2020   Genetic testing 11/14/2019   Family history of prostate cancer    Family history of breast cancer    Malignant neoplasm of upper-inner quadrant of right breast in female, estrogen receptor positive (HCC) 09/20/2018   B12 deficiency 04/13/2018   Joint pain 04/11/2018   Colon cancer  screening 09/16/2017   Prediabetes 07/28/2016   Allergic rhinitis 11/13/2014   Hyperlipidemia, mild 09/26/2014   Routine general medical examination at a health care facility 10/28/2012   GERD 09/16/2010   Hypothyroidism 05/07/2008   Osteoporosis 05/07/2008   Migraine with aura 11/11/2007   Past Medical History:  Diagnosis Date   Allergic rhinitis    Allergy    seasonal   Anemia    Anxiety    Arthritis    hands, knees   Asthma    as a child   Blood transfusion without reported diagnosis    as a child   Breast cancer (HCC) 2019   Right Breast Cancer   Cancer (HCC) 09/2018   Breast CA new DX, right breast   Cervical dysplasia    Common bile duct dilation    Diabetes mellitus without complication (HCC)    Endometriosis    Esophageal reflux    Family history of adverse reaction to anesthesia    younger sister anxiety for a dew days after   Family history of breast cancer    Family history of breast cancer    Family history of prostate cancer    Hepatic cyst 08/2020   multiple   Hepatic hemangioma 08/2020  intrahepatic hemangioma   Hiatal hernia    HSV-1 (herpes simplex virus 1) infection    Hyperlipidemia    Hypertension    pt.denies 10/19/22   Hypothyroid    Migraine with aura, without mention of intractable migraine without mention of status migrainosus    Osteopenia 08/2019   T score -2.2 FRAX 11% / 1.7%   Osteoporosis 05/07/2008   Qualifier: Diagnosis of  By: Randeen MD, Laine Caldron    Personal history of radiation therapy 2019   Right Breast Cancer   Pre-diabetes    Raynaud's disease    Past Surgical History:  Procedure Laterality Date   ABDOMINAL HYSTERECTOMY  2021   BIOPSY  09/12/2020   Procedure: BIOPSY;  Surgeon: Teressa Toribio SQUIBB, MD;  Location: [facility];  Service: Endoscopy;;   BREAST BIOPSY Left 11/27/2022   MM LT BREAST BX W LOC DEV 1ST LESION IMAGE BX SPEC STEREO GUIDE 11/27/2022 [facility]   BREAST EXCISIONAL BIOPSY Bilateral over  10 years ago   benign x 2    BREAST LUMPECTOMY Right 10/10/2018   BREAST LUMPECTOMY WITH RADIOACTIVE SEED AND SENTINEL LYMPH NODE BIOPSY Right 10/10/2018   Procedure: RIGHT BREAST LUMPECTOMY WITH RADIOACTIVE SEED AND RIGHT SENTINEL LYMPH NODE BIOPSY;  Surgeon: Curvin Mt III, MD;  Location: [facility];  Service: General;  Laterality: Right;   BREAST SURGERY     Benign breast lump excised   CERVICAL CONE BIOPSY  1985   severe dysplasia   COLONOSCOPY  2005 and dec 2019   normal   COLPOSCOPY     endoscopy  10/2018   ESOPHAGOGASTRODUODENOSCOPY (EGD) WITH PROPOFOL  N/A 09/12/2020   Procedure: ESOPHAGOGASTRODUODENOSCOPY (EGD) WITH PROPOFOL ;  Surgeon: Teressa Toribio SQUIBB, MD;  Location: [facility];  Service: Endoscopy;  Laterality: N/A;   EUS N/A 09/12/2020   Procedure: UPPER ENDOSCOPIC ULTRASOUND (EUS) RADIAL;  Surgeon: Teressa Toribio SQUIBB, MD;  Location: [facility];  Service: Endoscopy;  Laterality: N/A;   LAPAROSCOPIC BILATERAL SALPINGO OOPHERECTOMY Bilateral 10/27/2019   Procedure: LAPAROSCOPIC BILATERAL SALPINGO OOPHORECTOMY WITH PERITONEAL WASHINGS;  Surgeon: Lavoie, Marie-Lyne, MD;  Location: [facility] ;  Service: Gynecology;  Laterality: Bilateral;  request 1:00pm on Friday, Dec. 4th in [place] time held requests one hour OR time   moles removed from upper back, face lft, 1 between breasts, upper leg left inside  10/18/2019   wearing small round bandaids on for 2 weeks   ROTATOR CUFF REPAIR Bilateral 08/2008   UPPER GASTROINTESTINAL ENDOSCOPY     Social History[1] Family History  Problem Relation Age of Onset   Breast cancer Mother 35   Diabetes Mother    Irritable bowel syndrome Mother    Cancer Mother    Hearing loss Mother    Hyperlipidemia Mother    Miscarriages / Stillbirths Mother    Hypertension Mother    Hypertension Father    Heart disease Father    Hypertension Sister    Anuerysm Sister        head   Depression Sister     Irritable bowel syndrome Sister    Hypertension Sister    Anxiety disorder Sister    Other Sister        prediabetes   Celiac disease Brother    Diabetes Brother    Osteoporosis Maternal Grandmother    Diabetes Maternal Grandmother    Arthritis Maternal Grandmother    Heart disease Maternal Grandmother    COPD Paternal Grandmother    Heart disease Paternal Grandfather  Breast cancer Cousin 56       pat first cousin   Breast cancer Cousin        mat second cousin with breast cancer in her 30s   Thyroid  cancer Cousin 15       pat first cousin   Prostate cancer Other 72   Colon cancer Neg Hx    Stomach cancer Neg Hx    Esophageal cancer Neg Hx    Pancreatic cancer Neg Hx    Rectal cancer Neg Hx    Colon polyps Neg Hx    Crohn's disease Neg Hx    Ulcerative colitis Neg Hx    Allergies[2] Medications Ordered Prior to Encounter[3]  Review of Systems  Constitutional:  Negative for activity change, appetite change, fatigue, fever and unexpected weight change.  HENT:  Positive for ear pain and rhinorrhea. Negative for congestion, ear discharge, facial swelling, hearing loss, sore throat and trouble swallowing.   Eyes:  Negative for pain, redness, itching and visual disturbance.  Respiratory:  Negative for cough, chest tightness, shortness of breath and wheezing.   Cardiovascular:  Negative for chest pain and palpitations.  Gastrointestinal:  Negative for abdominal pain, blood in stool, constipation, diarrhea and nausea.  Endocrine: Negative for cold intolerance, heat intolerance, polydipsia and polyuria.  Genitourinary:  Negative for difficulty urinating, dysuria, frequency and urgency.  Musculoskeletal:  Negative for arthralgias, joint swelling and myalgias.  Skin:  Negative for pallor and rash.  Neurological:  Positive for headaches. Negative for dizziness, tremors, weakness and numbness.  Hematological:  Negative for adenopathy. Does not bruise/bleed easily.   Psychiatric/Behavioral:  Negative for decreased concentration and dysphoric mood. The patient is not nervous/anxious.        Objective:   Physical Exam Constitutional:      General: She is not in acute distress.    Appearance: Normal appearance. She is well-developed and normal weight. She is not ill-appearing or diaphoretic.  HENT:     Head: Normocephalic and atraumatic.     Right Ear: Tympanic membrane, ear canal and external ear normal. There is no impacted cerumen.     Left Ear: Tympanic membrane, ear canal and external ear normal. There is no impacted cerumen.     Nose: Rhinorrhea present.     Comments: Mild clear rhinorrhea     Mouth/Throat:     Mouth: Mucous membranes are moist.     Pharynx: Oropharynx is clear. No oropharyngeal exudate or posterior oropharyngeal erythema.  Eyes:     Conjunctiva/sclera: Conjunctivae normal.     Pupils: Pupils are equal, round, and reactive to light.  Neck:     Thyroid : No thyromegaly.     Vascular: No carotid bruit or JVD.  Cardiovascular:     Rate and Rhythm: Normal rate and regular rhythm.     Heart sounds: Normal heart sounds.     No gallop.  Pulmonary:     Effort: Pulmonary effort is normal. No respiratory distress.     Breath sounds: Normal breath sounds. No wheezing or rales.  Abdominal:     General: There is no distension or abdominal bruit.     Palpations: Abdomen is soft.  Musculoskeletal:     Cervical back: Normal range of motion and neck supple.     Right lower leg: No edema.     Left lower leg: No edema.  Lymphadenopathy:     Cervical: No cervical adenopathy.  Skin:    General: Skin is warm and dry.  Coloration: Skin is not pale.     Findings: No rash.  Neurological:     Mental Status: She is alert.     Cranial Nerves: No cranial nerve deficit.     Coordination: Coordination normal.     Deep Tendon Reflexes: Reflexes are normal and symmetric. Reflexes normal.  Psychiatric:        Mood and Affect: Mood normal.            Assessment & Plan:   Problem List Items Addressed This Visit       Other   Right ear pain - Primary   Suspect ETD No more rhinorrhea than usual/no fever or other symptoms  Reassuring exam   Intolerant of steroid ns Did get decadron  yesterday in ER for migraine and thinks it helped a bit   Prescription prednisone  20 mg daily 5 d  Nasal saline if needed  Instructed to update if any new symptoms Update if not starting to improve in a week or if worsening   Call back and Er precautions noted in detail today            [1]  Social History Tobacco Use   Smoking status: Never    Passive exposure: Never   Smokeless tobacco: Never  Vaping Use   Vaping status: Never Used  Substance Use Topics   Alcohol use: No   Drug use: No  [2]  Allergies Allergen Reactions   Ciprofloxacin Other (See Comments)    Dizziness    Codeine Phosphate Other (See Comments)    Dizziness, heart palpitations, nervous   Decongestant [Pseudoephedrine] Other (See Comments)    dizziness   Diclofenac    Diphenhydramine Hcl (Sleep)    Flonase  [Fluticasone  Propionate] Other (See Comments)    Worsens her migraine    Fluticasone     Hydrocodone  Other (See Comments)    Made nervous--10/09 surgery rotator cuff   Macrobid  [Nitrofurantoin ]     headaches   Nasal Spray    Nasonex  [Mometasone ]     Migraines   Reglan [Metoclopramide] Other (See Comments)    Dizziness   Topamax [Topiramate] Other (See Comments)    Dizziness    Benadryl [Diphenhydramine Hcl] Palpitations   Sulfa Antibiotics Anxiety  [3]  Current Outpatient Medications on File Prior to Visit  Medication Sig Dispense Refill   ALPRAZolam  (XANAX ) 0.5 MG tablet TAKE 1/2 TO 1 TABLET BY MOUTH ONCE DAILY AS NEEDED FOR SLEEP/ANXIETY 15 tablet 2   atenolol  (TENORMIN ) 25 MG tablet Take 1 tablet (25 mg total) by mouth daily. For migraine prevention 90 tablet 4   B Complex Vitamins (B COMPLEX PO) Take by mouth.     baclofen   (LIORESAL ) 10 MG tablet Take 1 tablet (10 mg total) by mouth 3 (three) times daily. 30 each 0   botulinum toxin Type A  (BOTOX ) 200 units injection Inject 200 Units into the muscle every 3 (three) months. Provider to inject botox  in head and neck muscle every 3 months disgard remaimimg 1 each 2   CALCIUM  CARBONATE-VITAMIN D  PO Take 1 tablet by mouth daily. 1000 mg     Coenzyme Q10 (CO Q 10 PO) Take 1 capsule by mouth daily.     Eptinezumab -jjmr (VYEPTI  IV) Inject into the vein every 3 (three) months.     famotidine  (PEPCID ) 40 MG tablet Take 40 mg by mouth 2 (two) times daily.      hydrocortisone  2.5 % cream Apply to affected areas BID PRN as directed. 30 g 3  hydrOXYzine  (ATARAX ) 25 MG tablet Take 37.5 mg by mouth at bedtime as needed.     ketorolac  (TORADOL ) 10 MG tablet Take 1 tablet (10 mg total) by mouth every 8 (eight) hours as needed. For migraine. 20 tablet 11   letrozole  (FEMARA ) 2.5 MG tablet TAKE 1 TABLET BY MOUTH EVERY DAY 90 tablet 3   levocetirizine (XYZAL) 5 MG tablet Take 5 mg by mouth at bedtime.   3   levothyroxine  (SYNTHROID ) 75 MCG tablet TAKE 1 TABLET BY MOUTH EVERY DAY BEFORE BREAKFAST 90 tablet 2   Magnesium  Oxide 400 (240 Mg) MG TABS Take 400 mg by mouth daily.      metFORMIN  (GLUCOPHAGE -XR) 500 MG 24 hr tablet Take 1 tablet (500 mg total) by mouth daily with breakfast. 90 tablet 3   Multiple Vitamin (MULTIVITAMIN WITH MINERALS) TABS tablet Take 1 tablet by mouth daily.     naratriptan  (AMERGE) 2.5 MG tablet Take 1 tablet (2.5 mg total) by mouth as needed for migraine. Take one (1) tablet at onset of headache; if returns or does not resolve, may repeat after 4 hours; do not exceed five (5) mg in 24 hours. 12 tablet 11   neomycin-polymyxin b-dexamethasone  (MAXITROL) 3.5-10000-0.1 SUSP 1 drop 3 (three) times daily.     Omega-3 Fatty Acids (FISH OIL) 1000 MG CAPS Take 1,000 mg by mouth daily.      OnabotulinumtoxinA  (BOTOX  IJ) Inject 1 Dose as directed every 6 (six) weeks.       ondansetron  (ZOFRAN  ODT) 4 MG disintegrating tablet Take 1 tablet (4 mg total) by mouth every 8 (eight) hours as needed. 20 tablet 0   pantoprazole  (PROTONIX ) 20 MG tablet TAKE 1 TABLET BY MOUTH EVERY DAY 90 tablet 1   PRESCRIPTION MEDICATION Inject into the skin every 6 (six) weeks. Steroid Trigger     Probiotic Product (PROBIOTIC-10 PO) Take 1 capsule by mouth daily.      promethazine  (PHENERGAN ) 25 MG tablet Take 25 mg by mouth daily as needed.     triamcinolone  cream (KENALOG ) 0.1 % Apply to affected areas BID PRN for up to two weeks at a time as directed. 30 g 1   TURMERIC PO Take 1 tablet by mouth daily.     VITAMIN D  PO Take 1 capsule by mouth daily.     No current facility-administered medications on file prior to visit.

## 2024-11-03 NOTE — Assessment & Plan Note (Signed)
 Suspect ETD No more rhinorrhea than usual/no fever or other symptoms  Reassuring exam   Intolerant of steroid ns Did get decadron  yesterday in ER for migraine and thinks it helped a bit   Prescription prednisone  20 mg daily 5 d  Nasal saline if needed  Instructed to update if any new symptoms Update if not starting to improve in a week or if worsening   Call back and Er precautions noted in detail today

## 2024-11-03 NOTE — Patient Instructions (Signed)
 I think your ear discomfort may be from eustachian tube dysfunction  Take the prednisone  20 mg daily for 5 days (take in the am)   Update if not starting to improve in a week or if worsening   If you develop more pain, or any drainage or hearing change let us  know

## 2024-11-05 ENCOUNTER — Other Ambulatory Visit: Payer: Self-pay | Admitting: Family Medicine

## 2024-11-07 ENCOUNTER — Ambulatory Visit: Admitting: Podiatry

## 2024-11-07 DIAGNOSIS — M79675 Pain in left toe(s): Secondary | ICD-10-CM | POA: Diagnosis not present

## 2024-11-07 DIAGNOSIS — B351 Tinea unguium: Secondary | ICD-10-CM

## 2024-11-07 DIAGNOSIS — M79674 Pain in right toe(s): Secondary | ICD-10-CM | POA: Diagnosis not present

## 2024-11-07 NOTE — Progress Notes (Signed)
°  Subjective:  Patient ID: Jennifer [name], female    DOB: 08/31/1956,  MRN: [id number]  Chief Complaint  Patient presents with   Nail Problem    Nail pain    68 y.o. female returns for the above complaint.  Patient presents with thick elongated Shogry mycotic toenails x 10 mild pain on proximal arch with ambulation is with pressure she would like for me debride as she is out of her [place] denies any other acute complaints  Objective:  There were no vitals filed for this visit. Podiatric Exam: Vascular: dorsalis pedis and posterior tibial pulses are palpable bilateral. Capillary return is immediate. Temperature gradient is WNL. Skin turgor WNL  Sensorium: Normal Semmes Weinstein monofilament test. Normal tactile sensation bilaterally. Nail Exam: Pt has thick disfigured discolored nails with subungual debris noted bilateral entire nail hallux through fifth toenails.  Pain on palpation to the nails. Ulcer Exam: There is no evidence of ulcer or pre-ulcerative changes or infection. Orthopedic Exam: Muscle tone and strength are WNL. No limitations in general ROM. No crepitus or effusions noted.  Skin: No Porokeratosis. No infection or ulcers    Assessment & Plan:   1. Pain due to onychomycosis of toenails of both feet     Patient was evaluated and treated and all questions answered.  Onychomycosis with pain  -Nails palliatively debrided as below. -Educated on self-care  Procedure: Nail Debridement Rationale: pain  Type of Debridement: manual, sharp debridement. Instrumentation: Nail nipper, rotary burr. Number of Nails: 10  Procedures and Treatment: Consent by patient was obtained for treatment procedures. The patient understood the discussion of treatment and procedures well. All questions were answered thoroughly reviewed. Debridement of mycotic and hypertrophic toenails, 1 through 5 bilateral and clearing of subungual debris. No ulceration, no infection noted.  Return Visit-Office  Procedure: Patient instructed to return to the office for a follow up visit 3 months for continued evaluation and treatment.  Franky Blanch, DPM    No follow-ups on file.

## 2024-11-08 ENCOUNTER — Inpatient Hospital Stay: Admission: RE | Admit: 2024-11-08 | Discharge: 2024-11-08 | Attending: Hematology and Oncology

## 2024-11-08 DIAGNOSIS — Z780 Asymptomatic menopausal state: Secondary | ICD-10-CM | POA: Diagnosis present

## 2024-11-14 ENCOUNTER — Encounter: Payer: Self-pay | Admitting: Family Medicine

## 2024-11-15 ENCOUNTER — Other Ambulatory Visit: Payer: Self-pay

## 2024-11-15 DIAGNOSIS — C50211 Malignant neoplasm of upper-inner quadrant of right female breast: Secondary | ICD-10-CM

## 2024-11-15 DIAGNOSIS — D509 Iron deficiency anemia, unspecified: Secondary | ICD-10-CM

## 2024-11-15 MED ORDER — PREDNISONE 20 MG PO TABS: 20.0000 mg | ORAL_TABLET | Freq: Every day | ORAL | 0 refills | Status: DC

## 2024-11-15 MED ORDER — AMOXICILLIN 500 MG PO CAPS: 500.0000 mg | ORAL_CAPSULE | Freq: Two times a day (BID) | ORAL | 0 refills | Status: DC

## 2024-11-15 NOTE — Telephone Encounter (Signed)
 Pt notified of Dr. Graham comments and verbalized understanding. Pt asked for me to [facility] and [facility] will get [facility] her to [facility] the [facility] when [facility] finds a local [facility] in [place]

## 2024-11-15 NOTE — Telephone Encounter (Signed)
 I pended prednisone  and amoxicillin  for possible ear infection   Is she is on her way to [place] she will need to give us  a pharmacy there I pended the prescriptions  Please call her ans send to pharm of choice If worse then [facility]   Follow up here after return if needed   Thanks

## 2024-11-20 ENCOUNTER — Inpatient Hospital Stay: Attending: Hematology and Oncology | Admitting: Adult Health

## 2024-11-20 ENCOUNTER — Inpatient Hospital Stay

## 2024-11-20 ENCOUNTER — Ambulatory Visit: Admitting: Family Medicine

## 2024-11-20 ENCOUNTER — Encounter: Payer: Self-pay | Admitting: Adult Health

## 2024-11-20 ENCOUNTER — Ambulatory Visit: Payer: Self-pay

## 2024-11-20 ENCOUNTER — Encounter: Payer: Self-pay | Admitting: Family Medicine

## 2024-11-20 VITALS — BP 126/71 | HR 66 | Temp 98.8°F | Resp 18 | Ht 59.0 in | Wt 115.0 lb

## 2024-11-20 VITALS — BP 128/82 | HR 73 | Temp 98.4°F | Ht 59.0 in | Wt 115.0 lb

## 2024-11-20 DIAGNOSIS — Z1732 Human epidermal growth factor receptor 2 negative status: Secondary | ICD-10-CM | POA: Diagnosis not present

## 2024-11-20 DIAGNOSIS — M810 Age-related osteoporosis without current pathological fracture: Secondary | ICD-10-CM | POA: Insufficient documentation

## 2024-11-20 DIAGNOSIS — Z808 Family history of malignant neoplasm of other organs or systems: Secondary | ICD-10-CM | POA: Insufficient documentation

## 2024-11-20 DIAGNOSIS — Z1721 Progesterone receptor positive status: Secondary | ICD-10-CM | POA: Insufficient documentation

## 2024-11-20 DIAGNOSIS — R519 Headache, unspecified: Secondary | ICD-10-CM

## 2024-11-20 DIAGNOSIS — Z923 Personal history of irradiation: Secondary | ICD-10-CM | POA: Insufficient documentation

## 2024-11-20 DIAGNOSIS — Z803 Family history of malignant neoplasm of breast: Secondary | ICD-10-CM | POA: Insufficient documentation

## 2024-11-20 DIAGNOSIS — G43109 Migraine with aura, not intractable, without status migrainosus: Secondary | ICD-10-CM | POA: Diagnosis not present

## 2024-11-20 DIAGNOSIS — Z9189 Other specified personal risk factors, not elsewhere classified: Secondary | ICD-10-CM

## 2024-11-20 DIAGNOSIS — D509 Iron deficiency anemia, unspecified: Secondary | ICD-10-CM

## 2024-11-20 DIAGNOSIS — H9201 Otalgia, right ear: Secondary | ICD-10-CM | POA: Diagnosis not present

## 2024-11-20 DIAGNOSIS — C50211 Malignant neoplasm of upper-inner quadrant of right female breast: Secondary | ICD-10-CM

## 2024-11-20 DIAGNOSIS — Z170 Estrogen receptor positive status [ER+]: Secondary | ICD-10-CM | POA: Insufficient documentation

## 2024-11-20 DIAGNOSIS — Z79811 Long term (current) use of aromatase inhibitors: Secondary | ICD-10-CM | POA: Diagnosis not present

## 2024-11-20 LAB — CBC WITH DIFFERENTIAL (CANCER CENTER ONLY)
Abs Immature Granulocytes: 0.01 K/uL (ref 0.00–0.07)
Basophils Absolute: 0 K/uL (ref 0.0–0.1)
Basophils Relative: 1 %
Eosinophils Absolute: 0.1 K/uL (ref 0.0–0.5)
Eosinophils Relative: 2 %
HCT: 39.4 % (ref 36.0–46.0)
Hemoglobin: 13.5 g/dL (ref 12.0–15.0)
Immature Granulocytes: 0 %
Lymphocytes Relative: 30 %
Lymphs Abs: 2 K/uL (ref 0.7–4.0)
MCH: 31.5 pg (ref 26.0–34.0)
MCHC: 34.3 g/dL (ref 30.0–36.0)
MCV: 92.1 fL (ref 80.0–100.0)
Monocytes Absolute: 0.6 K/uL (ref 0.1–1.0)
Monocytes Relative: 8 %
Neutro Abs: 3.8 K/uL (ref 1.7–7.7)
Neutrophils Relative %: 59 %
Platelet Count: 308 K/uL (ref 150–400)
RBC: 4.28 MIL/uL (ref 3.87–5.11)
RDW: 13.3 % (ref 11.5–15.5)
WBC Count: 6.5 K/uL (ref 4.0–10.5)
nRBC: 0 % (ref 0.0–0.2)

## 2024-11-20 LAB — CMP (CANCER CENTER ONLY)
ALT: 19 U/L (ref 0–44)
AST: 20 U/L (ref 15–41)
Albumin: 4.6 g/dL (ref 3.5–5.0)
Alkaline Phosphatase: 71 U/L (ref 38–126)
Anion gap: 9 (ref 5–15)
BUN: 13 mg/dL (ref 8–23)
CO2: 31 mmol/L (ref 22–32)
Calcium: 10 mg/dL (ref 8.9–10.3)
Chloride: 102 mmol/L (ref 98–111)
Creatinine: 0.77 mg/dL (ref 0.44–1.00)
GFR, Estimated: >60 mL/min
Glucose, Bld: 74 mg/dL (ref 70–99)
Potassium: 3.7 mmol/L (ref 3.5–5.1)
Sodium: 142 mmol/L (ref 135–145)
Total Bilirubin: 0.5 mg/dL (ref 0.0–1.2)
Total Protein: 7.6 g/dL (ref 6.5–8.1)

## 2024-11-20 LAB — IRON AND IRON BINDING CAPACITY (CC-WL,HP ONLY)
Iron: 104 ug/dL (ref 28–170)
Saturation Ratios: 22 % (ref 10.4–31.8)
TIBC: 480 ug/dL — ABNORMAL HIGH (ref 250–450)
UIBC: 376 ug/dL

## 2024-11-20 NOTE — Patient Instructions (Addendum)
 Take the prednisone    Hold off on the amoxicillin - I do not see an infection today  Lab for esr/crp today (for inflammatory condition to rule out)    Cover ears- protect from extreme temperatures   If fever/ rash/ vision change/ worse pain or other symptoms please call   Mention symptoms to your neurologist also

## 2024-11-20 NOTE — Assessment & Plan Note (Addendum)
 Worsened now (after briefly improving)  No change in exam but pt notes more temporal pain (but not temporal tenderness)   No signs of bacterial infection  Instructed to hold amox  Take the prednisone    Follow up with neuro surg tomorrow for auricular temporal nerve block and occipital nerve block  Crp and esr today-r/o TA  Continues to see neuro for migriane/ had botox    Call back and Er precautions noted in detail today   Watching for fever/rash or other new symptoms

## 2024-11-20 NOTE — Assessment & Plan Note (Addendum)
 Unclear if this is playing a role in right ear/temple pain   Has appointment with neuro for nerve block tomorrow an dwill mention it  No changes in exam   Reviewed neurology notes and neuro surgery notes today

## 2024-11-20 NOTE — Telephone Encounter (Signed)
 FYI Only or Action Required?: FYI only for provider: appointment scheduled on 11/20/24.  Patient was last seen in primary care on 11/03/2024 by Randeen Laine LABOR, MD.  Called Nurse Triage reporting Otalgia.  Symptoms began several weeks ago.  Interventions attempted: Nothing.  Symptoms are: unchanged.  Triage Disposition: See Physician Within 24 Hours  Patient/caregiver understands and will follow disposition?: Yes    Copied from CRM #[id number]. Topic: Clinical - Red Word Triage >> Nov 20, 2024 10:33 AM [place] R wrote: Red Word that prompted transfer to Nurse Triage:  Calling to schedule an appt with PCP - denied:  Ear pain and swelling - worsening.  States previously prescribed medications for this, but did not take as she was not seen in person.     Reason for Disposition  Earache  (Exceptions: Brief ear pain of lasting less than 60 minutes, or earache occurring during air travel.)  Answer Assessment - Initial Assessment Questions Pt called in requesting appt for worsening R ear pain that has progressed into migraines. Pt reports this is the third occurrence; initially treated with prednisone  then was prescribed amoxicillin  and prednisone  but pt did not take abx. Pt states she was out of town at the time and did not see PCP and did not feel comfortable taking abx without seeing provider. Pt reports at this time pain has worsened to migraines. Pt denies fever, cold symptoms or changes in hearing. Appointment scheduled for evaluation. Patient agrees with plan of care, and will call back if anything changes, or if symptoms worsen.     1. LOCATION: Which ear is involved?     R ear   2. ONSET: When did the ear pain start?      Ongoing x 2; pt treated with prednisone  initially, second occurrence pt did not take amoxicillin , only prednisone . Pt reports pain has worsened and now experiencing migraines   3. SEVERITY: How bad is the pain?  (Scale 1-10; mild, moderate or severe)      Moderate  4. URI SYMPTOMS: Do you have a runny nose or cough?     No   5. FEVER: Do you have a fever? If Yes, ask: What is your temperature, how was it measured, and when did it start?     No   7. OTHER SYMPTOMS: Do you have any other symptoms? (e.g., decreased hearing, dizziness, headache, stiff neck, vomiting)     None  Protocols used: Earache-A-AH

## 2024-11-20 NOTE — Assessment & Plan Note (Signed)
 With right ear pain On and off Brief improvement with low dose prednisone /symptoms returned and now temporal pain with ear pain  See a/p ear pain   Lab for ESR/CMP Normal cbc today from oncology  Has neurosurg visit tomorrow for auricular temporal and occipital nerve blocks

## 2024-11-20 NOTE — Telephone Encounter (Signed)
 Will see patient then Agree with ER and UC precautions

## 2024-11-20 NOTE — Progress Notes (Signed)
 "  Subjective:    Patient ID: Jennifer [name], female    DOB: [birth year], 68 y.o.   MRN: [id number]  HPI  Wt Readings from Last 3 Encounters:  11/20/24 115 lb (52.2 kg)  11/20/24 115 lb (52.2 kg)  11/03/24 118 lb 8 oz (53.8 kg)   23.23 kg/m  Vitals:   11/20/24 1506  BP: 128/82  Pulse: 73  Temp: 98.4 F (36.9 C)  SpO2: 98%    Pt presents for c/o  Right ear pain   Seen on 12/12 for this  We prescription prednisone  20 mg daily for 5 d  Suspected ETD  She called on 12/23  Said symptoms got much better then worse again after being out in the cold C/o swelling also    (also had migraine)  Was on her way out of town  We pended more prednisone  and amoxicillin    Just started prednisone  and antibiotic today- 20 minutes ago   Today Pain waxes and wanes  Was worse this am  Now just a little better   A little discomfort if she pulls on ear  Ear feels full  [name] is sensitive  Some discomfort in the temple-may be due to migraine  No rashes  No vision change  No nasal congestion  No fever     More migraines Eating is off during the holidays    Getting another nerve block from neurologist for migraines (Dr Darlis at [facility] having trigger point injection)  Auricular temporal nerve blocks and occipital nerve blocks- reviewed note from10/24     Patient Active Problem List   Diagnosis Date Noted   Temporal pain 11/20/2024   Right ear pain 11/03/2024   Weight loss 07/12/2024   Left knee pain 05/30/2024   Current use of proton pump inhibitor 09/12/2023   Chronic migraine without aura, with intractable migraine, so stated, with status migrainosus 07/29/2023   Migraine without aura and without status migrainosus, not intractable 07/29/2023   Thoracic back pain 01/21/2023   Pain in right shoulder 08/12/2022   Raynaud's disease 03/17/2022   Iron  deficiency anemia 09/08/2020   Common bile duct dilatation 09/06/2020   History of breast cancer 03/01/2020    Genetic testing 11/14/2019   Family history of prostate cancer    Family history of breast cancer    Malignant neoplasm of upper-inner quadrant of right breast in female, estrogen receptor positive (HCC) 09/20/2018   B12 deficiency 04/13/2018   Joint pain 04/11/2018   Colon cancer screening 09/16/2017   Prediabetes 07/28/2016   Allergic rhinitis 11/13/2014   Hyperlipidemia, mild 09/26/2014   Routine general medical examination at a health care facility 10/28/2012   GERD 09/16/2010   Hypothyroidism 05/07/2008   Osteoporosis 05/07/2008   Migraine with aura 11/11/2007   Past Medical History:  Diagnosis Date   Allergic rhinitis    Allergy    seasonal   Anemia    Anxiety    Arthritis    hands, knees   Asthma    as a child   Blood transfusion without reported diagnosis    as a child   Breast cancer (HCC) 2019   Right Breast Cancer   Cancer (HCC) 09/2018   Breast CA new DX, right breast   Cervical dysplasia    Common bile duct dilation    Diabetes mellitus without complication (HCC)    Endometriosis    Esophageal reflux    Family history of adverse reaction to anesthesia    younger  sister anxiety for a dew days after   Family history of breast cancer    Family history of breast cancer    Family history of prostate cancer    Hepatic cyst 08/2020   multiple   Hepatic hemangioma 08/2020   intrahepatic hemangioma   Hiatal hernia    HSV-1 (herpes simplex virus 1) infection    Hyperlipidemia    Hypertension    pt.denies 10/19/22   Hypothyroid    Migraine with aura, without mention of intractable migraine without mention of status migrainosus    Osteopenia 08/2019   T score -2.2 FRAX 11% / 1.7%   Osteoporosis 05/07/2008   Qualifier: Diagnosis of  By: Randeen MD, Laine Caldron    Personal history of radiation therapy 2019   Right Breast Cancer   Pre-diabetes    Raynaud's disease    Past Surgical History:  Procedure Laterality Date   ABDOMINAL HYSTERECTOMY  2021    BIOPSY  09/12/2020   Procedure: BIOPSY;  Surgeon: Teressa Toribio SQUIBB, MD;  Location: [facility];  Service: Endoscopy;;   BREAST BIOPSY Left 11/27/2022   MM LT BREAST BX W LOC DEV 1ST LESION IMAGE BX SPEC STEREO GUIDE 11/27/2022 [facility]   BREAST EXCISIONAL BIOPSY Bilateral over 10 years ago   benign x 2    BREAST LUMPECTOMY Right 10/10/2018   BREAST LUMPECTOMY WITH RADIOACTIVE SEED AND SENTINEL LYMPH NODE BIOPSY Right 10/10/2018   Procedure: RIGHT BREAST LUMPECTOMY WITH RADIOACTIVE SEED AND RIGHT SENTINEL LYMPH NODE BIOPSY;  Surgeon: Curvin Mt III, MD;  Location: [facility];  Service: General;  Laterality: Right;   BREAST SURGERY     Benign breast lump excised   CERVICAL CONE BIOPSY  1985   severe dysplasia   COLONOSCOPY  2005 and dec 2019   normal   COLPOSCOPY     endoscopy  10/2018   ESOPHAGOGASTRODUODENOSCOPY (EGD) WITH PROPOFOL  N/A 09/12/2020   Procedure: ESOPHAGOGASTRODUODENOSCOPY (EGD) WITH PROPOFOL ;  Surgeon: Teressa Toribio SQUIBB, MD;  Location: [facility];  Service: Endoscopy;  Laterality: N/A;   EUS N/A 09/12/2020   Procedure: UPPER ENDOSCOPIC ULTRASOUND (EUS) RADIAL;  Surgeon: Teressa Toribio SQUIBB, MD;  Location: [facility];  Service: Endoscopy;  Laterality: N/A;   LAPAROSCOPIC BILATERAL SALPINGO OOPHERECTOMY Bilateral 10/27/2019   Procedure: LAPAROSCOPIC BILATERAL SALPINGO OOPHORECTOMY WITH PERITONEAL WASHINGS;  Surgeon: Lavoie, Marie-Lyne, MD;  Location: [facility];  Service: Gynecology;  Laterality: Bilateral;  request 1:00pm on Friday, Dec. 4th in [place] Iqueue time held requests one hour OR time   moles removed from upper back, face lft, 1 between breasts, upper leg left inside  10/18/2019   wearing small round bandaids on for 2 weeks   ROTATOR CUFF REPAIR Bilateral 08/2008   UPPER GASTROINTESTINAL ENDOSCOPY     Social History[1] Family History  Problem Relation Age of Onset   Breast cancer Mother 66   Diabetes Mother     Irritable bowel syndrome Mother    Cancer Mother    Hearing loss Mother    Hyperlipidemia Mother    Miscarriages / Stillbirths Mother    Hypertension Mother    Hypertension Father    Heart disease Father    Hypertension Sister    Anuerysm Sister        head   Depression Sister    Irritable bowel syndrome Sister    Hypertension Sister    Anxiety disorder Sister    Other Sister        prediabetes  Celiac disease Brother    Diabetes Brother    Osteoporosis Maternal Grandmother    Diabetes Maternal Grandmother    Arthritis Maternal Grandmother    Heart disease Maternal Grandmother    COPD Paternal Grandmother    Heart disease Paternal Grandfather    Breast cancer Cousin 62       pat first cousin   Breast cancer Cousin        mat second cousin with breast cancer in her 30s   Thyroid  cancer Cousin 32       pat first cousin   Prostate cancer Other 49   Colon cancer Neg Hx    Stomach cancer Neg Hx    Esophageal cancer Neg Hx    Pancreatic cancer Neg Hx    Rectal cancer Neg Hx    Colon polyps Neg Hx    Crohn's disease Neg Hx    Ulcerative colitis Neg Hx    Allergies[2] Medications Ordered Prior to Encounter[3]  Review of Systems  Constitutional:  Negative for chills and fever.  HENT:  Positive for ear pain. Negative for ear discharge, facial swelling, hearing loss, postnasal drip, rhinorrhea, sinus pressure, sinus pain, sneezing, sore throat, trouble swallowing and voice change.   Respiratory:  Negative for cough, shortness of breath and wheezing.   Neurological:  Negative for dizziness, tremors, seizures, syncope, facial asymmetry, speech difficulty, weakness, light-headedness, numbness and headaches.  Hematological:  Negative for adenopathy.       Objective:   Physical Exam Constitutional:      General: She is not in acute distress.    Appearance: Normal appearance. She is well-developed and normal weight. She is not ill-appearing or diaphoretic.  HENT:     Head:  Normocephalic and atraumatic.     Comments: No temporal or sinus tenderness   No TM joint tenderness or popping     Right Ear: Ear canal and external ear normal. There is no impacted cerumen.     Left Ear: Tympanic membrane, ear canal and external ear normal. There is no impacted cerumen.     Ears:     Comments: Right TM is dull  No erythema or bulging   Some discomfort with tugging pinna  Ear canal is entirely clear     Nose: Nose normal. No congestion or rhinorrhea.     Mouth/Throat:     Mouth: Mucous membranes are moist.     Pharynx: Oropharynx is clear.  Eyes:     Conjunctiva/sclera: Conjunctivae normal.     Pupils: Pupils are equal, round, and reactive to light.  Neck:     Thyroid : No thyromegaly.     Vascular: No carotid bruit or JVD.  Cardiovascular:     Rate and Rhythm: Normal rate and regular rhythm.     Heart sounds: Normal heart sounds.     No gallop.  Pulmonary:     Effort: Pulmonary effort is normal. No respiratory distress.     Breath sounds: Normal breath sounds. No wheezing or rales.  Abdominal:     General: There is no distension or abdominal bruit.     Palpations: Abdomen is soft.  Musculoskeletal:     Cervical back: Normal range of motion and neck supple.     Right lower leg: No edema.     Left lower leg: No edema.  Lymphadenopathy:     Cervical: No cervical adenopathy.  Skin:    General: Skin is warm and dry.     Coloration: Skin is not  pale.     Findings: No rash.  Neurological:     Mental Status: She is alert.     Coordination: Coordination normal.     Deep Tendon Reflexes: Reflexes are normal and symmetric. Reflexes normal.  Psychiatric:        Mood and Affect: Mood normal.           Assessment & Plan:   Problem List Items Addressed This Visit       Cardiovascular and Mediastinum   Migraine with aura   Unclear if this is playing a role in right ear/temple pain   Has appointment with neuro for nerve block tomorrow an dwill mention  it  No changes in exam   Reviewed neurology notes and neuro surgery notes today        Other   Temporal pain   With right ear pain On and off Brief improvement with low dose prednisone /symptoms returned and now temporal pain with ear pain  See a/p ear pain   Lab for ESR/CMP Normal cbc today from oncology  Has neurosurg visit tomorrow for auricular temporal and occipital nerve blocks         Relevant Orders   Sedimentation Rate   C-reactive protein   Right ear pain - Primary   Worsened now (after briefly improving)  No change in exam but pt notes more temporal pain (but not temporal tenderness)   No signs of bacterial infection  Instructed to hold amox  Take the prednisone    Follow up with neuro surg tomorrow for auricular temporal nerve block and occipital nerve block  Crp and esr today-r/o TA  Continues to see neuro for migriane/ had botox    Call back and Er precautions noted in detail today   Watching for fever/rash or other new symptoms      Relevant Orders   Sedimentation Rate   C-reactive protein      [1]  Social History Tobacco Use   Smoking status: Never    Passive exposure: Never   Smokeless tobacco: Never  Vaping Use   Vaping status: Never Used  Substance Use Topics   Alcohol use: No   Drug use: No  [2]  Allergies Allergen Reactions   Ciprofloxacin Other (See Comments)    Dizziness    Codeine Phosphate Other (See Comments)    Dizziness, heart palpitations, nervous   Decongestant [Pseudoephedrine] Other (See Comments)    dizziness   Diclofenac    Diphenhydramine Hcl (Sleep)    Flonase  [Fluticasone  Propionate] Other (See Comments)    Worsens her migraine    Fluticasone     Hydrocodone  Other (See Comments)    Made nervous--10/09 surgery rotator cuff   Macrobid  [Nitrofurantoin ]     headaches   Nasal Spray    Nasonex  [Mometasone ]     Migraines   Reglan [Metoclopramide] Other (See Comments)    Dizziness   Topamax [Topiramate] Other  (See Comments)    Dizziness    Benadryl [Diphenhydramine Hcl] Palpitations   Sulfa Antibiotics Anxiety  [3]  Current Outpatient Medications on File Prior to Visit  Medication Sig Dispense Refill   ALPRAZolam  (XANAX ) 0.5 MG tablet TAKE 1/2 TO 1 TABLET BY MOUTH ONCE DAILY AS NEEDED FOR SLEEP/ANXIETY 15 tablet 2   atenolol  (TENORMIN ) 25 MG tablet Take 1 tablet (25 mg total) by mouth daily. For migraine prevention 90 tablet 4   B Complex Vitamins (B COMPLEX PO) Take by mouth.     baclofen  (LIORESAL ) 10 MG tablet Take 1  tablet (10 mg total) by mouth 3 (three) times daily. 30 each 0   botulinum toxin Type A  (BOTOX ) 200 units injection Inject 200 Units into the muscle every 3 (three) months. Provider to inject botox  in head and neck muscle every 3 months disgard remaimimg 1 each 2   CALCIUM  CARBONATE-VITAMIN D  PO Take 1 tablet by mouth daily. 1000 mg     Coenzyme Q10 (CO Q 10 PO) Take 1 capsule by mouth daily.     Eptinezumab -jjmr (VYEPTI  IV) Inject into the vein every 3 (three) months.     famotidine  (PEPCID ) 40 MG tablet Take 40 mg by mouth 2 (two) times daily.      hydrocortisone  2.5 % cream Apply to affected areas BID PRN as directed. 30 g 3   hydrOXYzine  (ATARAX ) 25 MG tablet Take 37.5 mg by mouth at bedtime as needed.     ketorolac  (TORADOL ) 10 MG tablet Take 1 tablet (10 mg total) by mouth every 8 (eight) hours as needed. For migraine. 20 tablet 11   letrozole  (FEMARA ) 2.5 MG tablet TAKE 1 TABLET BY MOUTH EVERY DAY 90 tablet 3   levocetirizine (XYZAL) 5 MG tablet Take 5 mg by mouth at bedtime.   3   levothyroxine  (SYNTHROID ) 75 MCG tablet TAKE 1 TABLET BY MOUTH EVERY DAY BEFORE BREAKFAST 90 tablet 1   Magnesium  Oxide 400 (240 Mg) MG TABS Take 400 mg by mouth daily.      metFORMIN  (GLUCOPHAGE -XR) 500 MG 24 hr tablet Take 1 tablet (500 mg total) by mouth daily with breakfast. 90 tablet 3   Multiple Vitamin (MULTIVITAMIN WITH MINERALS) TABS tablet Take 1 tablet by mouth daily.     naratriptan   (AMERGE) 2.5 MG tablet Take 1 tablet (2.5 mg total) by mouth as needed for migraine. Take one (1) tablet at onset of headache; if returns or does not resolve, may repeat after 4 hours; do not exceed five (5) mg in 24 hours. 12 tablet 11   neomycin-polymyxin b-dexamethasone  (MAXITROL) 3.5-10000-0.1 SUSP 1 drop 3 (three) times daily.     Omega-3 Fatty Acids (FISH OIL) 1000 MG CAPS Take 1,000 mg by mouth daily.      OnabotulinumtoxinA  (BOTOX  IJ) Inject 1 Dose as directed every 6 (six) weeks.      ondansetron  (ZOFRAN  ODT) 4 MG disintegrating tablet Take 1 tablet (4 mg total) by mouth every 8 (eight) hours as needed. 20 tablet 0   pantoprazole  (PROTONIX ) 20 MG tablet TAKE 1 TABLET BY MOUTH EVERY DAY 90 tablet 1   predniSONE  (DELTASONE ) 20 MG tablet Take 1 tablet (20 mg total) by mouth daily with breakfast. 5 tablet 0   PRESCRIPTION MEDICATION Inject into the skin every 6 (six) weeks. Steroid Trigger     Probiotic Product (PROBIOTIC-10 PO) Take 1 capsule by mouth daily.      promethazine  (PHENERGAN ) 25 MG tablet Take 25 mg by mouth daily as needed.     triamcinolone  cream (KENALOG ) 0.1 % Apply to affected areas BID PRN for up to two weeks at a time as directed. 30 g 1   TURMERIC PO Take 1 tablet by mouth daily.     VITAMIN D  PO Take 1 capsule by mouth daily.     No current facility-administered medications on file prior to visit.   "

## 2024-11-20 NOTE — Progress Notes (Signed)
 [facility] Cancer Follow up:    Tower, Laine LABOR, MD [address] [place] [address]   DIAGNOSIS: Cancer Staging  Malignant neoplasm of upper-inner quadrant of right breast in female, estrogen receptor positive (HCC) Staging form: Breast, AJCC 8th Edition - Clinical stage from 09/21/2018: Stage IA (cT1c, cN0, cM0, G1, ER+, PR+, HER2-) - Unsigned Histologic grading system: 3 grade system Laterality: Right Staged by: Pathologist and managing physician Stage used in treatment planning: Yes National guidelines used in treatment planning: Yes Type of national guideline used in treatment planning: NCCN - Pathologic: Stage IA (pT1c, pN0, cM0, G1, ER+, PR+, HER2-, Oncotype DX score: 28) - Unsigned Multigene prognostic tests performed: Oncotype DX Recurrence score range: Greater than or equal to 11 Histologic grading system: 3 grade system    SUMMARY OF ONCOLOGIC HISTORY: Oncology History  Malignant neoplasm of upper-inner quadrant of right breast in female, estrogen receptor positive (HCC)  09/13/2018 Initial Diagnosis   Diagnostic mammogram detected right breast mass with distortion LIQ at 2:00 9 cm from nipple 1.7 cm, at 1:00 there was a benign 1.2 cm fibrocystic change, biopsy of the 2:00 mass revealed grade 1 IDC with DCIS ER 100%, PR 90%, Ki-67 10%, HER-2 1+ negative, T1CN0 stage Ia clinical stage   10/10/2018 Surgery   Right lumpectomy: IDC grade 1, 1.2 cm, with intermediate grade DCIS, margins negative, 0/2 lymph nodes negative, T1CN0 stage Ia ER 100%, PR 90%, Ki-67 10%, HER-2 1+ negative    10/10/2018 Oncotype testing   oncotype results of 29: Risk of recurrence without chemo 18%   11/07/2018 - 12/08/2018 Radiation Therapy   1. Right Breast / 42.56 Gy in 16 fractions 2. Right Breast Boost / 8 Gy in 4 fractions - Total dose 50.56 Gy   12/2018 -  Anti-estrogen oral therapy   Anastrozole  daily, planned for 7 years   11/11/2019 Genetic Testing    Negative genetic testing on the common hereditary cancer panel.  The Common Hereditary Gene Panel offered by [facility] includes sequencing and/or deletion duplication testing of the following 48 genes: APC, ATM, AXIN2, BARD1, BMPR1A, BRCA1, BRCA2, BRIP1, CDH1, CDK4, CDKN2A (p14ARF), CDKN2A (p16INK4a), CHEK2, CTNNA1, DICER1, EPCAM (Deletion/duplication testing only), GREM1 (promoter region deletion/duplication testing only), KIT, MEN1, MLH1, MSH2, MSH3, MSH6, MUTYH, NBN, NF1, NHTL1, PALB2, PDGFRA, PMS2, POLD1, POLE, PTEN, RAD50, RAD51C, RAD51D, RNF43, SDHB, SDHC, SDHD, SMAD4, SMARCA4. STK11, TP53, TSC1, TSC2, and VHL.  The following genes were evaluated for sequence changes only: SDHA and HOXB13 c.251G>A variant only. The report date is 11/11/2019     CURRENT THERAPY: prolia   INTERVAL HISTORY:  Discussed the use of AI scribe software for clinical note transcription with the patient, who gave verbal consent to proceed.  History of Present Illness Jennifer [name] is a 68 year old female with ER-positive right breast cancer and osteoporosis who presents for oncology follow-up and review of bone health.  She is under surveillance for ER-positive right breast cancer. She has dense breast tissue (category D) and at her last visit with Dr. Odean he recommended she undergo a contrast-enhanced mammogram in April 2026 that she continue Signatera blood test for recurrence monitoring. She is worried about recurrence, including extra-mammary sites, but has no new breast masses, pain, discharge, lumps, abnormal bleeding, or bruising.   She has osteopenia (improved from osteoporosis) with bone density on November 08, 2024 showing lowest T-score -2.3 at the femoral neck, improved from -2.6 in December 2024. Lumbar spine was stable and total hip T-score  was -1.7. She received a single dose of Prolia  in March 2025 but stopped due to intermittent dental pain and concern for osteonecrosis of the jaw. She is off Prolia  without  interval bone loss and questions whether she should proceed with Prolia  today or  non-pharmacologic strategies, including increased weight-bearing exercise, to support bone health.  She endorses some new dental pain that has not yet been evaluated by her dentist.      Patient Active Problem List   Diagnosis Date Noted   Right ear pain 11/03/2024   Weight loss 07/12/2024   Left knee pain 05/30/2024   Cerumen impaction 11/10/2023   Current use of proton pump inhibitor 09/12/2023   Chronic migraine without aura, with intractable migraine, so stated, with status migrainosus 07/29/2023   Migraine without aura and without status migrainosus, not intractable 07/29/2023   Thoracic back pain 01/21/2023   Pain in right shoulder 08/12/2022   Raynaud's disease 03/17/2022   Iron  deficiency anemia 09/08/2020   Common bile duct dilatation 09/06/2020   History of breast cancer 03/01/2020   Genetic testing 11/14/2019   Family history of prostate cancer    Family history of breast cancer    Malignant neoplasm of upper-inner quadrant of right breast in female, estrogen receptor positive (HCC) 09/20/2018   B12 deficiency 04/13/2018   Joint pain 04/11/2018   Colon cancer screening 09/16/2017   Prediabetes 07/28/2016   Allergic rhinitis 11/13/2014   Hyperlipidemia, mild 09/26/2014   Routine general medical examination at a health care facility 10/28/2012   GERD 09/16/2010   Hypothyroidism 05/07/2008   Osteoporosis 05/07/2008   Migraine with aura 11/11/2007    is allergic to ciprofloxacin, codeine phosphate, decongestant [pseudoephedrine], diclofenac, diphenhydramine hcl (sleep), flonase  [fluticasone  propionate], fluticasone , hydrocodone , macrobid  [nitrofurantoin ], nasal spray, nasonex  [mometasone ], reglan [metoclopramide], topamax [topiramate], benadryl [diphenhydramine hcl], and sulfa antibiotics.  MEDICAL HISTORY: Past Medical History:  Diagnosis Date   Allergic rhinitis    Allergy     seasonal   Anemia    Anxiety    Arthritis    hands, knees   Asthma    as a child   Blood transfusion without reported diagnosis    as a child   Breast cancer (HCC) 2019   Right Breast Cancer   Cancer (HCC) 09/2018   Breast CA new DX, right breast   Cervical dysplasia    Common bile duct dilation    Diabetes mellitus without complication (HCC)    Endometriosis    Esophageal reflux    Family history of adverse reaction to anesthesia    younger sister anxiety for a dew days after   Family history of breast cancer    Family history of breast cancer    Family history of prostate cancer    Hepatic cyst 08/2020   multiple   Hepatic hemangioma 08/2020   intrahepatic hemangioma   Hiatal hernia    HSV-1 (herpes simplex virus 1) infection    Hyperlipidemia    Hypertension    pt.denies 10/19/22   Hypothyroid    Migraine with aura, without mention of intractable migraine without mention of status migrainosus    Osteopenia 08/2019   T score -2.2 FRAX 11% / 1.7%   Osteoporosis 05/07/2008   Qualifier: Diagnosis of  By: Randeen MD, Laine Caldron    Personal history of radiation therapy 2019   Right Breast Cancer   Pre-diabetes    Raynaud's disease     SURGICAL HISTORY: Past Surgical History:  Procedure Laterality Date  ABDOMINAL HYSTERECTOMY  2021   BIOPSY  09/12/2020   Procedure: BIOPSY;  Surgeon: Teressa Toribio SQUIBB, MD;  Location: [facility];  Service: Endoscopy;;   BREAST BIOPSY Left 11/27/2022   MM LT BREAST BX W LOC DEV 1ST LESION IMAGE BX SPEC STEREO GUIDE 11/27/2022 [facility]   BREAST EXCISIONAL BIOPSY Bilateral over 10 years ago   benign x 2    BREAST LUMPECTOMY Right 10/10/2018   BREAST LUMPECTOMY WITH RADIOACTIVE SEED AND SENTINEL LYMPH NODE BIOPSY Right 10/10/2018   Procedure: RIGHT BREAST LUMPECTOMY WITH RADIOACTIVE SEED AND RIGHT SENTINEL LYMPH NODE BIOPSY;  Surgeon: Curvin Mt III, MD;  Location: [facility];  Service: General;  Laterality:  Right;   BREAST SURGERY     Benign breast lump excised   CERVICAL CONE BIOPSY  1985   severe dysplasia   COLONOSCOPY  2005 and dec 2019   normal   COLPOSCOPY     endoscopy  10/2018   ESOPHAGOGASTRODUODENOSCOPY (EGD) WITH PROPOFOL  N/A 09/12/2020   Procedure: ESOPHAGOGASTRODUODENOSCOPY (EGD) WITH PROPOFOL ;  Surgeon: Teressa Toribio SQUIBB, MD;  Location: [facility];  Service: Endoscopy;  Laterality: N/A;   EUS N/A 09/12/2020   Procedure: UPPER ENDOSCOPIC ULTRASOUND (EUS) RADIAL;  Surgeon: Teressa Toribio SQUIBB, MD;  Location: [facility];  Service: Endoscopy;  Laterality: N/A;   LAPAROSCOPIC BILATERAL SALPINGO OOPHERECTOMY Bilateral 10/27/2019   Procedure: LAPAROSCOPIC BILATERAL SALPINGO OOPHORECTOMY WITH PERITONEAL WASHINGS;  Surgeon: Lavoie, Marie-Lyne, MD;  Location: [facility];  Service: Gynecology;  Laterality: Bilateral;  request 1:00pm on Friday, Dec. 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   Social Drivers of Health   Tobacco Use: Low Risk (11/20/2024)   Patient History    Smoking Tobacco Use: Never    Smokeless Tobacco Use: Never    Passive Exposure: Never  Financial Resource Strain: Low Risk (11/20/2024)   Overall Financial Resource Strain (CARDIA)    Difficulty of Paying Living Expenses: Not hard at all  Food Insecurity: No Food Insecurity (11/20/2024)   Epic    Worried About Radiation Protection Practitioner of Food in the Last Year: Never true    Ran Out of Food in the Last Year: Never true  Transportation Needs: No Transportation Needs (10/31/2024)   Epic    Lack of Transportation (Medical): No  Lack of Transportation (Non-Medical): No  Physical Activity: Inactive (11/20/2024)   Exercise Vital Sign    Days of Exercise per Week: 0 days    Minutes of Exercise per Session: 0 min  Stress: No Stress Concern Present (11/20/2024)   [employer] of Occupational Health - Occupational Stress Questionnaire    Feeling of Stress: Only a little  Social Connections: Moderately Integrated (10/31/2024)   Social Connection and Isolation Panel    Frequency of Communication with Friends and Family: More than three times a week    Frequency of Social Gatherings with Friends and Family: Twice a week    Attends Religious Services: More than 4 times per year    Active Member of [employer] or Organizations: Yes    Attends Banker Meetings: More than 4 times per year    Marital Status: Widowed  Intimate Partner Violence: Not At Risk (11/20/2024)   Epic    Fear of Current or Ex-Partner: No    Emotionally Abused: No    Physically Abused: No    Sexually Abused: No  Depression (PHQ2-9): Low Risk (11/20/2024)   Depression (PHQ2-9)    PHQ-2 Score: 0  Alcohol Screen: Low Risk (11/20/2024)   Alcohol Screen    Last Alcohol Screening Score (AUDIT): 0  Housing: Unknown (11/20/2024)   Epic    Unable to Pay for Housing in the Last Year:  No    Number of Times Moved in the Last Year: Not on file    Homeless in the Last Year: No  Utilities: Not At Risk (11/20/2024)   Epic    Threatened with loss of utilities: No  Health Literacy: Adequate Health Literacy (11/20/2024)   B1300 Health Literacy    Frequency of need for help with medical instructions: Never    FAMILY HISTORY: Family History  Problem Relation Age of Onset   Breast cancer Mother 63   Diabetes Mother    Irritable bowel syndrome Mother    Cancer Mother    Hearing loss Mother    Hyperlipidemia Mother    Miscarriages / Stillbirths Mother    Hypertension Mother    Hypertension Father    Heart disease Father    Hypertension Sister    Anuerysm Sister        head   Depression Sister    Irritable bowel syndrome Sister    Hypertension Sister    Anxiety disorder Sister    Other Sister        prediabetes   Celiac disease Brother    Diabetes Brother    Osteoporosis Maternal Grandmother    Diabetes Maternal Grandmother    Arthritis Maternal Grandmother    Heart disease Maternal Grandmother    COPD Paternal Grandmother    Heart disease Paternal Grandfather    Breast cancer Cousin 77       pat first cousin   Breast cancer Cousin        mat second cousin with breast cancer in her 30s   Thyroid  cancer Cousin 26       pat first cousin   Prostate cancer Other 73   Colon cancer Neg Hx    Stomach cancer Neg Hx    Esophageal cancer Neg Hx    Pancreatic cancer Neg Hx    Rectal cancer Neg Hx    Colon polyps Neg Hx    Crohn's disease Neg Hx    Ulcerative colitis Neg Hx     Review of Systems  Constitutional:  Negative  for appetite change, chills, fatigue, fever and unexpected weight change.  HENT:   Negative for hearing loss, lump/mass and trouble swallowing.   Eyes:  Negative for eye problems and icterus.  Respiratory:  Negative for chest tightness, cough and shortness of breath.   Cardiovascular:  Negative for chest pain, leg swelling and palpitations.   Gastrointestinal:  Negative for abdominal distention, abdominal pain, constipation, diarrhea, nausea and vomiting.  Endocrine: Negative for hot flashes.  Genitourinary:  Negative for difficulty urinating.   Musculoskeletal:  Negative for arthralgias.  Skin:  Negative for itching and rash.  Neurological:  Negative for dizziness, extremity weakness, headaches and numbness.  Hematological:  Negative for adenopathy. Does not bruise/bleed easily.  Psychiatric/Behavioral:  Negative for depression. The patient is not nervous/anxious.       PHYSICAL EXAMINATION   Onc Performance Status - 11/20/24 1158       ECOG Perf Status   ECOG Perf Status Restricted in physically strenuous activity but ambulatory and able to carry out work of a light or sedentary nature, e.g., light house work, office work      KPS SCALE   KPS % SCORE Able to carry on normal activity, minor s/s of disease          Vitals:   11/20/24 1149  BP: 126/71  Pulse: 66  Resp: 18  Temp: 98.8 F (37.1 C)  SpO2: 99%    Physical Exam Constitutional:      General: She is not in acute distress.    Appearance: Normal appearance. She is not toxic-appearing.  HENT:     Head: Normocephalic and atraumatic.  Eyes:     General: No scleral icterus. Skin:    General: Skin is warm and dry.     Findings: No rash.  Neurological:     General: No focal deficit present.     Mental Status: She is alert.  Psychiatric:        Mood and Affect: Mood normal.        Behavior: Behavior normal.   Breast exam deferred since recently completed in 09/2024 and no breast concerns today  LABORATORY DATA:  CBC    Component Value Date/Time   WBC 6.5 11/20/2024 1122   WBC 5.8 07/16/2024 1139   RBC 4.28 11/20/2024 1122   HGB 13.5 11/20/2024 1122   HGB 14.1 03/17/2015 0608   HCT 39.4 11/20/2024 1122   HCT 40.7 03/17/2015 0608   PLT 308 11/20/2024 1122   PLT 255 03/17/2015 0608   MCV 92.1 11/20/2024 1122   MCV 92 03/17/2015 0608    MCH 31.5 11/20/2024 1122   MCHC 34.3 11/20/2024 1122   RDW 13.3 11/20/2024 1122   RDW 12.3 03/17/2015 0608   LYMPHSABS 2.0 11/20/2024 1122   LYMPHSABS 0.3 (L) 03/17/2015 0608   MONOABS 0.6 11/20/2024 1122   MONOABS 0.3 03/17/2015 0608   EOSABS 0.1 11/20/2024 1122   EOSABS 0.0 03/17/2015 0608   BASOSABS 0.0 11/20/2024 1122   BASOSABS 0.0 03/17/2015 0608    CMP     Component Value Date/Time   NA 142 11/20/2024 1122   NA 140 03/17/2015 0608   K 3.7 11/20/2024 1122   K 3.5 03/17/2015 0608   CL 102 11/20/2024 1122   CL 103 03/17/2015 0608   CO2 31 11/20/2024 1122   CO2 26 03/17/2015 0608   GLUCOSE 74 11/20/2024 1122   GLUCOSE 126 (H) 03/17/2015 0608   BUN 13 11/20/2024 1122   BUN 13 03/17/2015 9391  CREATININE 0.77 11/20/2024 1122   CREATININE 0.72 03/17/2015 0608   CALCIUM  10.0 11/20/2024 1122   CALCIUM  8.8 (L) 03/17/2015 0608   PROT 7.6 11/20/2024 1122   PROT 7.6 03/17/2015 0608   ALBUMIN 4.6 11/20/2024 1122   ALBUMIN 4.4 03/17/2015 0608   AST 20 11/20/2024 1122   ALT 19 11/20/2024 1122   ALT 23 03/17/2015 0608   ALKPHOS 71 11/20/2024 1122   ALKPHOS 48 03/17/2015 0608   BILITOT 0.5 11/20/2024 1122   GFRNONAA >60 11/20/2024 1122   GFRNONAA >60 03/17/2015 0608   GFRAA >60 11/06/2019 1234   GFRAA >60 09/21/2018 1234   GFRAA >60 03/17/2015 0608     ASSESSMENT and THERAPY PLAN:    Assessment and Plan Assessment & Plan Estrogen receptor positive right breast cancer Under active surveillance with no recurrence.  Continues on endocrine therapy for risk reduction. Aware of recurrence risks and surveillance rationale. - Ordered contrast-enhanced mammogram for April 2026 due to dense breast tissue. - Continue Letrozole  daily - Recommended continued surveillance with Signatera testing; next test next month.  Age-related osteoporosis Osteoporosis with improved bone density, now in osteopenic range. Will hold today's prolia  dose due to dental concerns. Discussed  osteonecrosis risk with Prolia . Prefers non-pharmacologic measures. Labs normal. - Recommended holding Prolia  until dental issues evaluated by dentist - Advised repeat bone density testing in one year. - Encouraged weight-bearing exercise.  RTC in 6 months for labs, f/u, and injection.   All questions were answered. The patient knows to call the clinic with any problems, questions or concerns. We can certainly see the patient much sooner if necessary.  Total encounter time:30 minutes*in face-to-face visit time, chart review, lab review, care coordination, order entry, and documentation of the encounter time.    Morna Kendall, NP 11/20/2024 12:02 PM Medical Oncology and Hematology [facility] [address] [place], [place] [address] Tel. [phone]    Fax. [phone]  *Total Encounter Time as defined by the Centers for Medicare and Medicaid Services includes, in addition to the face-to-face time of a patient visit (documented in the note above) non-face-to-face time: obtaining and reviewing outside history, ordering and reviewing medications, tests or procedures, care coordination (communications with other health care professionals or caregivers) and documentation in the medical record.

## 2024-11-21 ENCOUNTER — Ambulatory Visit: Payer: Self-pay | Admitting: Family Medicine

## 2024-11-21 ENCOUNTER — Ambulatory Visit (INDEPENDENT_AMBULATORY_CARE_PROVIDER_SITE_OTHER)

## 2024-11-21 VITALS — BP 118/64 | Ht 59.0 in | Wt 115.0 lb

## 2024-11-21 DIAGNOSIS — Z0000 Encounter for general adult medical examination without abnormal findings: Secondary | ICD-10-CM | POA: Diagnosis not present

## 2024-11-21 LAB — SEDIMENTATION RATE: Sed Rate: 7 mm/h (ref 0–30)

## 2024-11-21 LAB — C-REACTIVE PROTEIN: CRP: <0.5 mg/dL — ABNORMAL LOW (ref 1.0–20.0)

## 2024-11-21 NOTE — Patient Instructions (Signed)
 Jennifer [name],  Thank you for taking the time for your Medicare Wellness Visit. I appreciate your continued commitment to your health goals. Please review the care plan we discussed, and feel free to reach out if I can assist you further.  Please note that Annual Wellness Visits do not include a physical exam. Some assessments may be limited, especially if the visit was conducted virtually. If needed, we may recommend an in-person follow-up with your provider.  Ongoing Care Seeing your primary care provider every 3 to 6 months helps us  monitor your health and provide consistent, personalized care.   Referrals If a referral was made during today's visit and you haven't received any updates within two weeks, please contact the referred provider directly to check on the status.  Recommended Screenings:  Health Maintenance  Topic Date Due   DTaP/Tdap/Td vaccine (2 - Td or Tdap) 10/28/2022   Medicare Annual Wellness Visit  07/05/2024   COVID-19 Vaccine (7 - 2025-26 season) 07/28/2025*   Breast Cancer Screening  02/21/2025   Colon Cancer Screening  08/20/2026   Osteoporosis screening with Bone Density Scan  11/08/2026   Pneumococcal Vaccine for age over 23  Completed   Flu Shot  Completed   Hepatitis C Screening  Completed   Zoster (Shingles) Vaccine  Completed   Meningitis B Vaccine  Aged Out  *Topic was postponed. The date shown is not the original due date.       11/21/2024    9:44 AM  Advanced Directives  Does Patient Have a Medical Advance Directive? Yes  Type of Advance Directive Living will;Healthcare Power of Attorney  Does patient want to make changes to medical advance directive? No - Patient declined  Copy of Healthcare Power of Attorney in Chart? No - copy requested    Vision: Annual vision screenings are recommended for early detection of glaucoma, cataracts, and diabetic retinopathy. These exams can also reveal signs of chronic conditions such as diabetes and high blood  pressure.  Dental: Annual dental screenings help detect early signs of oral cancer, gum disease, and other conditions linked to overall health, including heart disease and diabetes.

## 2024-11-21 NOTE — Progress Notes (Signed)
 "  Chief Complaint  Patient presents with   Medicare Wellness     Subjective:   Jennifer [name] is a 68 y.o. female who presents for a Medicare Annual Wellness Visit.  Visit info / Clinical Intake: Medicare Wellness Visit Type:: Subsequent Annual Wellness Visit Persons participating in visit and providing information:: patient Medicare Wellness Visit Mode:: In-person (required for WTM) Interpreter Needed?: No Pre-visit prep was completed: yes AWV questionnaire completed by patient prior to visit?: no Living arrangements:: with family/others (mom lives with pt) Patient's Overall Health Status Rating: good Typical amount of pain: some Does pain affect daily life?: (!) yes Are you currently prescribed opioids?: no  Dietary Habits and Nutritional Risks How many meals a day?: 3 Eats fruit and vegetables daily?: yes Most meals are obtained by: preparing own meals In the last 2 weeks, have you had any of the following?: (!) nausea, vomiting, diarrhea (nausea from blocked eushatioan tube rt ear) Diabetic:: no  Functional Status Activities of Daily Living (to include ambulation/medication): Independent Ambulation: Independent Medication Administration: Independent Home Management (perform basic housework or laundry): Independent Manage your own finances?: yes Primary transportation is: driving Concerns about vision?: no *vision screening is required for WTM* Concerns about hearing?: no  Fall Screening Falls in the past year?: 0 Number of falls in past year: 0 Was there an injury with Fall?: 0 Fall Risk Category Calculator: 0 Patient Fall Risk Level: Low Fall Risk  Fall Risk Patient at Risk for Falls Due to: No Fall Risks Fall risk Follow up: Falls evaluation completed; Education provided; Falls prevention discussed  Home and Transportation Safety: All rugs have non-skid backing?: N/A, no rugs All stairs or steps have railings?: yes Grab bars in the bathtub or shower?:  yes Have non-skid surface in bathtub or shower?: yes Good home lighting?: yes Regular seat belt use?: yes Hospital stays in the last year:: no  Cognitive Assessment Difficulty concentrating, remembering, or making decisions? : yes Will 6CIT or Mini Cog be Completed: yes What year is it?: 0 points What month is it?: 0 points Give patient an address phrase to remember (5 components): [address] [place]  About what time is it?: 0 points Count backwards from 20 to 1: 0 points Say the months of the year in reverse: 0 points Repeat the address phrase from earlier: 0 points 6 CIT Score: 0 points  Advance Directives (For Healthcare) Does Patient Have a Medical Advance Directive?: Yes Does patient want to make changes to medical advance directive?: No - Patient declined Type of Advance Directive: Living will; Healthcare Power of Attorney Copy of Healthcare Power of Attorney in Chart?: No - copy requested Copy of Living Will in Chart?: No - copy requested Would patient like information on creating a medical advance directive?: No - Patient declined  Reviewed/Updated  Reviewed/Updated: Reviewed All (Medical, Surgical, Family, Medications, Allergies, Care Teams, Patient Goals)    Allergies (verified) Ciprofloxacin, Codeine phosphate, Decongestant [pseudoephedrine], Diclofenac, Diphenhydramine hcl (sleep), Flonase  [fluticasone  propionate], Fluticasone , Hydrocodone , Macrobid  [nitrofurantoin ], Nasal spray, Nasonex  [mometasone ], Reglan [metoclopramide], Topamax [topiramate], Benadryl [diphenhydramine hcl], and Sulfa antibiotics   Current Medications (verified) Outpatient Encounter Medications as of 11/21/2024  Medication Sig   ALPRAZolam  (XANAX ) 0.5 MG tablet TAKE 1/2 TO 1 TABLET BY MOUTH ONCE DAILY AS NEEDED FOR SLEEP/ANXIETY   atenolol  (TENORMIN ) 25 MG tablet Take 1 tablet (25 mg total) by mouth daily. For migraine prevention   B Complex Vitamins (B COMPLEX PO) Take by mouth.    baclofen  (LIORESAL )  10 MG tablet Take 1 tablet (10 mg total) by mouth 3 (three) times daily.   botulinum toxin Type A  (BOTOX ) 200 units injection Inject 200 Units into the muscle every 3 (three) months. Provider to inject botox  in head and neck muscle every 3 months disgard remaimimg   CALCIUM  CARBONATE-VITAMIN D  PO Take 1 tablet by mouth daily. 1000 mg   Coenzyme Q10 (CO Q 10 PO) Take 1 capsule by mouth daily.   Eptinezumab -jjmr (VYEPTI  IV) Inject into the vein every 3 (three) months.   famotidine  (PEPCID ) 40 MG tablet Take 40 mg by mouth 2 (two) times daily.    hydrocortisone  2.5 % cream Apply to affected areas BID PRN as directed.   hydrOXYzine  (ATARAX ) 25 MG tablet Take 37.5 mg by mouth at bedtime as needed.   ketorolac  (TORADOL ) 10 MG tablet Take 1 tablet (10 mg total) by mouth every 8 (eight) hours as needed. For migraine.   letrozole  (FEMARA ) 2.5 MG tablet TAKE 1 TABLET BY MOUTH EVERY DAY   levocetirizine (XYZAL) 5 MG tablet Take 5 mg by mouth at bedtime.    levothyroxine  (SYNTHROID ) 75 MCG tablet TAKE 1 TABLET BY MOUTH EVERY DAY BEFORE BREAKFAST   Magnesium  Oxide 400 (240 Mg) MG TABS Take 400 mg by mouth daily.    metFORMIN  (GLUCOPHAGE -XR) 500 MG 24 hr tablet Take 1 tablet (500 mg total) by mouth daily with breakfast.   Multiple Vitamin (MULTIVITAMIN WITH MINERALS) TABS tablet Take 1 tablet by mouth daily.   naratriptan  (AMERGE) 2.5 MG tablet Take 1 tablet (2.5 mg total) by mouth as needed for migraine. Take one (1) tablet at onset of headache; if returns or does not resolve, may repeat after 4 hours; do not exceed five (5) mg in 24 hours.   neomycin-polymyxin b-dexamethasone  (MAXITROL) 3.5-10000-0.1 SUSP 1 drop 3 (three) times daily.   Omega-3 Fatty Acids (FISH OIL) 1000 MG CAPS Take 1,000 mg by mouth daily.    OnabotulinumtoxinA  (BOTOX  IJ) Inject 1 Dose as directed every 6 (six) weeks.    ondansetron  (ZOFRAN  ODT) 4 MG disintegrating tablet Take 1 tablet (4 mg total) by mouth every 8  (eight) hours as needed.   pantoprazole  (PROTONIX ) 20 MG tablet TAKE 1 TABLET BY MOUTH EVERY DAY   predniSONE  (DELTASONE ) 20 MG tablet Take 1 tablet (20 mg total) by mouth daily with breakfast.   PRESCRIPTION MEDICATION Inject into the skin every 6 (six) weeks. Steroid Trigger   Probiotic Product (PROBIOTIC-10 PO) Take 1 capsule by mouth daily.    promethazine  (PHENERGAN ) 25 MG tablet Take 25 mg by mouth daily as needed.   triamcinolone  cream (KENALOG ) 0.1 % Apply to affected areas BID PRN for up to two weeks at a time as directed.   TURMERIC PO Take 1 tablet by mouth daily.   VITAMIN D  PO Take 1 capsule by mouth daily.   No facility-administered encounter medications on file as of 11/21/2024.    History: Past Medical History:  Diagnosis Date   Allergic rhinitis    Allergy    seasonal   Anemia    Anxiety    Arthritis    hands, knees   Asthma    as a child   Blood transfusion without reported diagnosis    as a child   Breast cancer (HCC) 2019   Right Breast Cancer   Cancer (HCC) 09/2018   Breast CA new DX, right breast   Cervical dysplasia    Common bile duct dilation    Diabetes  mellitus without complication (HCC)    Endometriosis    Esophageal reflux    Family history of adverse reaction to anesthesia    younger sister anxiety for a dew days after   Family history of breast cancer    Family history of breast cancer    Family history of prostate cancer    Hepatic cyst 08/2020   multiple   Hepatic hemangioma 08/2020   intrahepatic hemangioma   Hiatal hernia    HSV-1 (herpes simplex virus 1) infection    Hyperlipidemia    Hypertension    pt.denies 10/19/22   Hypothyroid    Migraine with aura, without mention of intractable migraine without mention of status migrainosus    Osteopenia 08/2019   T score -2.2 FRAX 11% / 1.7%   Osteoporosis 05/07/2008   Qualifier: Diagnosis of  By: Randeen MD, Laine Caldron    Personal history of radiation therapy 2019   Right Breast  Cancer   Pre-diabetes    Raynaud's disease    Past Surgical History:  Procedure Laterality Date   ABDOMINAL HYSTERECTOMY  2021   BIOPSY  09/12/2020   Procedure: BIOPSY;  Surgeon: Teressa Toribio SQUIBB, MD;  Location: [facility];  Service: Endoscopy;;   BREAST BIOPSY Left 11/27/2022   MM LT BREAST BX W LOC DEV 1ST LESION IMAGE BX SPEC STEREO GUIDE 11/27/2022 [facility]   BREAST EXCISIONAL BIOPSY Bilateral over 10 years ago   benign x 2    BREAST LUMPECTOMY Right 10/10/2018   BREAST LUMPECTOMY WITH RADIOACTIVE SEED AND SENTINEL LYMPH NODE BIOPSY Right 10/10/2018   Procedure: RIGHT BREAST LUMPECTOMY WITH RADIOACTIVE SEED AND RIGHT SENTINEL LYMPH NODE BIOPSY;  Surgeon: Curvin Mt III, MD;  Location: [facility];  Service: General;  Laterality: Right;   BREAST SURGERY     Benign breast lump excised   CERVICAL CONE BIOPSY  1985   severe dysplasia   COLONOSCOPY  2005 and dec 2019   normal   COLPOSCOPY     endoscopy  10/2018   ESOPHAGOGASTRODUODENOSCOPY (EGD) WITH PROPOFOL  N/A 09/12/2020   Procedure: ESOPHAGOGASTRODUODENOSCOPY (EGD) WITH PROPOFOL ;  Surgeon: Teressa Toribio SQUIBB, MD;  Location: [facility];  Service: Endoscopy;  Laterality: N/A;   EUS N/A 09/12/2020   Procedure: UPPER ENDOSCOPIC ULTRASOUND (EUS) RADIAL;  Surgeon: Teressa Toribio SQUIBB, MD;  Location: [facility];  Service: Endoscopy;  Laterality: N/A;   LAPAROSCOPIC BILATERAL SALPINGO OOPHERECTOMY Bilateral 10/27/2019   Procedure: LAPAROSCOPIC BILATERAL SALPINGO OOPHORECTOMY WITH PERITONEAL WASHINGS;  Surgeon: Lavoie, Marie-Lyne, MD;  Location: [facility];  Service: Gynecology;  Laterality: Bilateral;  request 1:00pm on Friday, Dec. 4th in [place] Iqueue time held requests one hour OR time   moles removed from upper back, face lft, 1 between breasts, upper leg left inside  10/18/2019   wearing small round bandaids on for 2 weeks   ROTATOR CUFF REPAIR Bilateral 08/2008   UPPER GASTROINTESTINAL  ENDOSCOPY     Family History  Problem Relation Age of Onset   Breast cancer Mother 10   Diabetes Mother    Irritable bowel syndrome Mother    Cancer Mother    Hearing loss Mother    Hyperlipidemia Mother    Miscarriages / Stillbirths Mother    Hypertension Mother    Hypertension Father    Heart disease Father    Hypertension Sister    Anuerysm Sister        head   Depression Sister    Irritable bowel syndrome Sister  Hypertension Sister    Anxiety disorder Sister    Other Sister        prediabetes   Celiac disease Brother    Diabetes Brother    Osteoporosis Maternal Grandmother    Diabetes Maternal Grandmother    Arthritis Maternal Grandmother    Heart disease Maternal Grandmother    COPD Paternal Grandmother    Heart disease Paternal Grandfather    Breast cancer Cousin 85       pat first cousin   Breast cancer Cousin        mat second cousin with breast cancer in her 30s   Thyroid  cancer Cousin 76       pat first cousin   Prostate cancer Other 93   Colon cancer Neg Hx    Stomach cancer Neg Hx    Esophageal cancer Neg Hx    Pancreatic cancer Neg Hx    Rectal cancer Neg Hx    Colon polyps Neg Hx    Crohn's disease Neg Hx    Ulcerative colitis Neg Hx    Social History   Occupational History   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  Tobacco Use   Smoking status: Never    Passive exposure: Never   Smokeless tobacco: Never  Vaping Use   Vaping status: Never Used  Substance and Sexual Activity   Alcohol use: No   Drug use: No   Sexual activity: Not Currently    Birth control/protection: Post-menopausal    Comment: 1st intercourse 16 yo-5 partners, hx of breast cancer, HSV oral   Tobacco Counseling Counseling given: Not Answered  SDOH Screenings   Food Insecurity: No Food Insecurity (11/21/2024)  Housing: Low Risk (11/21/2024)  Transportation Needs: No Transportation Needs (11/21/2024)  Utilities: Not At Risk (11/21/2024)  Alcohol Screen: Low Risk (11/20/2024)   Depression (PHQ2-9): Low Risk (11/21/2024)  Financial Resource Strain: Low Risk (11/20/2024)  Physical Activity: Inactive (11/21/2024)  Social Connections: Moderately Integrated (11/21/2024)  Stress: No Stress Concern Present (11/21/2024)  Tobacco Use: Low Risk (11/21/2024)  Health Literacy: Adequate Health Literacy (11/21/2024)   See flowsheets for full screening details  Depression Screen PHQ 2 & 9 Depression Scale- Over the past 2 weeks, how often have you been bothered by any of the following problems? Little interest or pleasure in doing things: 0 Feeling down, depressed, or hopeless (PHQ Adolescent also includes...irritable): 0 PHQ-2 Total Score: 0 Trouble falling or staying asleep, or sleeping too much: 0 Feeling tired or having little energy: 1 Poor appetite or overeating (PHQ Adolescent also includes...weight loss): 1 Feeling bad about yourself - or that you are a failure or have let yourself or your family down: 0 Trouble concentrating on things, such as reading the newspaper or watching television (PHQ Adolescent also includes...like school work): 0 Moving or speaking so slowly that other people could have noticed. 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  Hearing/Vision  screen Vision Screening - Comments:: UTD w/visits to [facility] Jennifer [name] Immunizations and Health Maintenance Health Maintenance  Topic Date Due   DTaP/Tdap/Td (2 - Td or Tdap) 10/28/2022   Medicare Annual Wellness (AWV)  07/05/2024   COVID-19 Vaccine (7 - 2025-26 season) 07/28/2025 (Originally 07/24/2024)   Mammogram  02/21/2025   Colonoscopy  08/20/2026   Bone Density Scan  11/08/2026   Pneumococcal Vaccine: 50+ Years  Completed   Influenza Vaccine  Completed   Hepatitis C Screening  Completed   Zoster Vaccines- Shingrix  Completed   Meningococcal B Vaccine  Aged Out        Assessment/Plan:  This is a routine wellness examination for Jennifer [name].  Patient Care Team: Tower, Laine LABOR, MD as PCP - General (Family Medicine) Odean Potts, MD as Consulting Physician (Hematology and Oncology) Dewey Rush, MD as Consulting Physician (Radiation Oncology) Curvin Deward MOULD, MD as Consulting Physician (General Surgery) Jennifer [name] Feliciano Hugger, MD as Consulting Physician (Ophthalmology)  I have personally reviewed and noted the following in the patients chart:   Medical and social history Use of alcohol, tobacco or illicit drugs  Current medications and supplements including opioid prescriptions. Functional ability and status Nutritional status Physical activity Advanced directives List of other physicians Hospitalizations, surgeries, and ER visits in previous 12 months Vitals Screenings to include cognitive, depression, and falls Referrals and appointments  No orders of the defined types were placed in this encounter.  In addition, I have reviewed and discussed with patient certain preventive protocols, quality metrics, and best practice recommendations. A written personalized care plan for preventive services as well as general preventive health recommendations were provided to patient.   Erminio LITTIE Saris, LPN   [id number]   No follow-ups on file.  After Visit Summary: (In  Person-Printed) AVS printed and given to the patient  Nurse Notes: Patient advised to keep follow-up appointment with PCP (Feb 2026) Appointment(s) made: (Feb 2026) Screenings not ordered: Declined Referral/Order for Covid Will discuss Covid with PCP at next visit  "

## 2024-11-28 ENCOUNTER — Encounter: Payer: Self-pay | Admitting: Hematology and Oncology

## 2024-11-28 ENCOUNTER — Telehealth: Payer: Self-pay | Admitting: Neurology

## 2024-11-28 NOTE — Telephone Encounter (Signed)
 I called pt to see if she wanted to still continue Botox  with our office. She states [facility] couldn't get her in until May so she will be keeping appt with us . She provided new [facility] insurance as well, submitted PA via CMM. Key: [id number]

## 2024-11-30 ENCOUNTER — Other Ambulatory Visit: Payer: Self-pay

## 2024-11-30 ENCOUNTER — Other Ambulatory Visit (HOSPITAL_COMMUNITY): Payer: Self-pay

## 2024-11-30 ENCOUNTER — Encounter: Payer: Self-pay | Admitting: Hematology and Oncology

## 2024-11-30 NOTE — Telephone Encounter (Signed)
 Auth#: [id number] (11/28/24-11/22/25)

## 2024-12-04 ENCOUNTER — Other Ambulatory Visit (HOSPITAL_COMMUNITY): Payer: Self-pay

## 2024-12-05 NOTE — Telephone Encounter (Signed)
 I received a message from [facility] that the patient was upset and could not afford $700 copay to fill through [facility]. It seems like you talked about this at September 2025 appt, when she was referred to [facility]. She told the pharmacy she doesn't want to cancel her appt with us  next week. I'm not entirely sure how to move forward?

## 2024-12-07 LAB — SIGNATERA ONLY (NATERA MANAGED)
SIGNATERA MTM READOUT: 0 MTM/ml
SIGNATERA TEST RESULT: NEGATIVE

## 2024-12-07 NOTE — Telephone Encounter (Signed)
 FYI- Spoke with Angie, she was okay with pt being B/B for this last round with our office.

## 2024-12-09 ENCOUNTER — Other Ambulatory Visit: Payer: Self-pay | Admitting: Family Medicine

## 2024-12-13 ENCOUNTER — Encounter: Payer: Self-pay | Admitting: Hematology and Oncology

## 2024-12-13 ENCOUNTER — Ambulatory Visit: Admitting: Neurology

## 2024-12-13 VITALS — BP 115/79

## 2024-12-13 DIAGNOSIS — G43711 Chronic migraine without aura, intractable, with status migrainosus: Secondary | ICD-10-CM

## 2024-12-13 DIAGNOSIS — G43009 Migraine without aura, not intractable, without status migrainosus: Secondary | ICD-10-CM

## 2024-12-13 DIAGNOSIS — G43709 Chronic migraine without aura, not intractable, without status migrainosus: Secondary | ICD-10-CM

## 2024-12-13 MED ORDER — ONABOTULINUMTOXINA 100 UNITS IJ SOLR
155.0000 [IU] | Freq: Once | INTRAMUSCULAR | Status: AC
  Administered 2024-12-13: 155 [IU] via INTRAMUSCULAR

## 2024-12-13 NOTE — Progress Notes (Signed)
 Botox - 100 units x 1 vial Lot: [id number] Expiration: 2027/06 NDC: 9976-8854-95  Botox - 100 units x 1 vial Lot: [id number] Expiration: 2027/05 NDC: 9976-8854-95  Bacteriostatic 0.9% Sodium Chloride - 4mL total Lot: [id number] Expiration: 2027-Mar-31 NDC: 9590-8033-97  Dx:  G43.009   [facility] Witnessed by: Delon ORN

## 2024-12-13 NOTE — Progress Notes (Signed)
" ° °  BOTOX  PROCEDURE NOTE FOR MIGRAINE HEADACHE   HISTORY: Jennifer [name] is here for Botox . Last was 09/14/24 with me.  She remains on Vyepti  300 mg every 3 months.  Also receiving nerve blocks from Dr. Eichman.  This seems to be a very good plan for her, has about 4 migraines a month.  Scheduled to see [facility].   Description of procedure:  The patient was placed in a sitting position. The standard protocol was used for Botox  as follows, with 5 units of Botox  injected at each site:   -Procerus muscle, midline injection  -Corrugator muscle, bilateral injection  -Frontalis muscle, bilateral injection, with 2 sites each side, medial injection was performed in the upper one third of the frontalis muscle, in the region vertical from the medial inferior edge of the superior orbital rim. The lateral injection was again in the upper one third of the forehead vertically above the lateral limbus of the cornea, 1.5 cm lateral to the medial injection site.  -Temporalis muscle injection, 4 sites, bilaterally. The first injection was 3 cm above the tragus of the ear, second injection site was 1.5 cm to 3 cm up from the first injection site in line with the tragus of the ear. The third injection site was 1.5-3 cm forward between the first 2 injection sites. The fourth injection site was 1.5 cm posterior to the second injection site.  -Occipitalis muscle injection, 3 sites, bilaterally. The first injection was done one half way between the occipital protuberance and the tip of the mastoid process behind the ear. The second injection site was done lateral and superior to the first, 1 fingerbreadth from the first injection. The third injection site was 1 fingerbreadth superiorly and medially from the first injection site.  -Cervical paraspinal muscle injection, 2 sites, bilateral, the first injection site was 1 cm from the midline of the cervical spine, 3 cm inferior to the lower border of the occipital  protuberance. The second injection site was 1.5 cm superiorly and laterally to the first injection site.  -Trapezius muscle injection was performed at 3 sites, bilaterally. The first injection site was in the upper trapezius muscle halfway between the inflection point of the neck, and the acromion. The second injection site was one half way between the acromion and the first injection site. The third injection was done between the first injection site and the inflection point of the neck.   A 200 unit bottle of Botox  was used, 155 units were injected, the rest of the Botox  was wasted. The patient tolerated the procedure well, there were no complications of the above procedure.  Botox  NDC [id number] Lot number [id number] Expiration date 04/2026 [facility]   "

## 2024-12-14 ENCOUNTER — Ambulatory Visit: Payer: Self-pay

## 2024-12-14 NOTE — Telephone Encounter (Signed)
 Will see patient then Agree with ER and UC precautions

## 2024-12-14 NOTE — Telephone Encounter (Signed)
 FYI Only or Action Required?: FYI only for provider: appointment scheduled on 12/15/24.  Patient was last seen in primary care on 11/20/2024 by Randeen Laine LABOR, MD.  Called Nurse Triage reporting Migraine.  Symptoms began a week ago.  Interventions attempted: Prescription medications: naratriptan .  Symptoms are: unchanged.  Triage Disposition: See PCP When Office is Open (Within 3 Days)  Patient/caregiver understands and will follow disposition?: Yes                 Message from Hadassah PARAS sent at 12/14/2024  9:47 AM EST  Reason for Triage: Worsening migraines for 5 days. Have been treating with OTC medication and it is not working   Reason for Disposition  [1] MILD-MODERATE headache AND [2] present > 3 days (72 hours) AND [3] no improvement after using Care Advice  Answer Assessment - Initial Assessment Questions 12/13/24 Neurology Botox  injection for migraines. Wants to come in to see PCP for a steroid taper.   1. LOCATION: Where does it hurt?      pain on right side of face (right ear beginning to hurt again)  2. ONSET: When did the headache start? (e.g., minutes, hours, days)      7 days ago. States in the past 6 days has treated 5 times with naratriptan .  3. PATTERN: Does the pain come and go, or has it been constant since it started?     Constant.  4. SEVERITY: How bad is the pain? and What does it keep you from doing?  (e.g., Scale 1-10; mild, moderate, or severe)     Tightness 3/10.  5. RECURRENT SYMPTOM: Have you ever had headaches before? If Yes, ask: When was the last time? and What happened that time?      Yes, frequently suffers from migraine headaches.  6. CAUSE: What do you think is causing the headache?     Unsure.  7. MIGRAINE: Have you been diagnosed with migraine headaches? If Yes, ask: Is this headache similar?      Yes, this feels similar but is not going away with her naratriptan .  8. HEAD INJURY: Has there been any  recent injury to your head?      No.  9. OTHER SYMPTOMS: Do you have any other symptoms? (e.g., fever, stiff neck, eye pain, sore throat, cold symptoms)     Nausea x today, Significant weight loss I'm concerned something is really wrong with me about 20 pound weight loss since May 2025. Nausea vomiting dizziness 2 days ago, treated with naratriptan  No LOC/syncope, facial droop, unilateral weakness, unilateral numbness, changes in speech or vision, fever.  Protocols used: [facility]

## 2024-12-15 ENCOUNTER — Encounter: Payer: Self-pay | Admitting: Family Medicine

## 2024-12-15 ENCOUNTER — Ambulatory Visit (INDEPENDENT_AMBULATORY_CARE_PROVIDER_SITE_OTHER): Admitting: Family Medicine

## 2024-12-15 VITALS — BP 116/68 | HR 58 | Temp 97.8°F | Ht 59.0 in | Wt 113.5 lb

## 2024-12-15 DIAGNOSIS — G43009 Migraine without aura, not intractable, without status migrainosus: Secondary | ICD-10-CM | POA: Diagnosis not present

## 2024-12-15 DIAGNOSIS — R519 Headache, unspecified: Secondary | ICD-10-CM

## 2024-12-15 DIAGNOSIS — G43711 Chronic migraine without aura, intractable, with status migrainosus: Secondary | ICD-10-CM

## 2024-12-15 MED ORDER — PREDNISONE 10 MG PO TABS: ORAL_TABLET | ORAL | 0 refills | Status: AC

## 2024-12-15 NOTE — Assessment & Plan Note (Addendum)
 Along with migraine   Getting nerve blocks from neuro surg ? Dx of occipital neuralgia? Reassuring exam  Pred taper today

## 2024-12-15 NOTE — Progress Notes (Unsigned)
 "  Subjective:    Patient ID: Jennifer [name], female    DOB: 1956-11-16, 69 y.o.   MRN: [id number]  HPI  Wt Readings from Last 3 Encounters:  12/15/24 113 lb 8 oz (51.5 kg)  11/21/24 115 lb (52.2 kg)  11/20/24 115 lb (52.2 kg)   22.92 kg/m  Vitals:   12/15/24 0932  BP: 116/68  Pulse: (!) 58  Temp: 97.8 F (36.6 C)  SpO2: 99%    Pt presents with c/o  Acute headache/migraine , occipital neuralgia    [facility] to [facility] Neuro surg Dr Darlis- gets nerve blocks - ? Occipital neuralgia (has had 2, last once dec 30th)   Is getting botox  injections    Amerge Toradol -has not used  Atenolol  25 mg - (off for several days)- back on it  Zofran  -prn  Vyepti  IV Q 3 mo (C[facility])- next infusion is next month -it started working after the 3 rd shot   Has ice to apply/cap , rebounds when she takes it off   Dec -had 4 migraines (a miracle for her) in setting of stress 3 in November Is making progress overall    Today- 8 th day of a headache On 1/13 ate 2 chocolate candies -set her off (is usually very careful about sugar intake)  In 7 days had 5 migraines Treated with emerge  Stopped working   N/v times one (zofran )  Had her botox  on wed am   Took 30 mg of prednisone  that she had left  Made her more anxious than usual so did not take next dose   Good with fluids   Right side of head - not throbbing , is constant  No vision change  Today some pain /sensitivity of her right ear  Today is better      Patient Active Problem List   Diagnosis Date Noted   Temporal pain 11/20/2024   Right ear pain 11/03/2024   Weight loss 07/12/2024   Left knee pain 05/30/2024   Current use of proton pump inhibitor 09/12/2023   Chronic migraine without aura, with intractable migraine, so stated, with status migrainosus 07/29/2023   Migraine without aura and without status migrainosus, not intractable 07/29/2023   Thoracic back pain 01/21/2023   Pain in right shoulder  08/12/2022   Raynaud's disease 03/17/2022   Iron  deficiency anemia 09/08/2020   Common bile duct dilatation 09/06/2020   History of breast cancer 03/01/2020   Genetic testing 11/14/2019   Family history of prostate cancer    Family history of breast cancer    Malignant neoplasm of upper-inner quadrant of right breast in female, estrogen receptor positive (HCC) 09/20/2018   B12 deficiency 04/13/2018   Joint pain 04/11/2018   Colon cancer screening 09/16/2017   Prediabetes 07/28/2016   Allergic rhinitis 11/13/2014   Hyperlipidemia, mild 09/26/2014   Routine general medical examination at a health care facility 10/28/2012   GERD 09/16/2010   Hypothyroidism 05/07/2008   Osteoporosis 05/07/2008   Migraine with aura 11/11/2007   Past Medical History:  Diagnosis Date   Allergic rhinitis    Allergy    seasonal   Anemia    Anxiety    Arthritis    hands, knees   Asthma    as a child   Blood transfusion without reported diagnosis    as a child   Breast cancer (HCC) 2019   Right Breast Cancer   Cancer (HCC) 09/2018   Breast CA new DX, right  breast   Cervical dysplasia    Common bile duct dilation    Diabetes mellitus without complication (HCC)    Endometriosis    Esophageal reflux    Family history of adverse reaction to anesthesia    younger sister anxiety for a dew days after   Family history of breast cancer    Family history of breast cancer    Family history of prostate cancer    Hepatic cyst 08/2020   multiple   Hepatic hemangioma 08/2020   intrahepatic hemangioma   Hiatal hernia    HSV-1 (herpes simplex virus 1) infection    Hyperlipidemia    Hypertension    pt.denies 10/19/22   Hypothyroid    Migraine with aura, without mention of intractable migraine without mention of status migrainosus    Osteopenia 08/2019   T score -2.2 FRAX 11% / 1.7%   Osteoporosis 05/07/2008   Qualifier: Diagnosis of  By: Randeen MD, Laine Caldron    Personal history of radiation  therapy 2019   Right Breast Cancer   Pre-diabetes    Raynaud's disease    Past Surgical History:  Procedure Laterality Date   ABDOMINAL HYSTERECTOMY  2021   BIOPSY  09/12/2020   Procedure: BIOPSY;  Surgeon: Teressa Toribio SQUIBB, MD;  Location: [facility];  Service: Endoscopy;;   BREAST BIOPSY Left 11/27/2022   MM LT BREAST BX W LOC DEV 1ST LESION IMAGE BX SPEC STEREO GUIDE 11/27/2022 [facility]   BREAST EXCISIONAL BIOPSY Bilateral over 10 years ago   benign x 2    BREAST LUMPECTOMY Right 10/10/2018   BREAST LUMPECTOMY WITH RADIOACTIVE SEED AND SENTINEL LYMPH NODE BIOPSY Right 10/10/2018   Procedure: RIGHT BREAST LUMPECTOMY WITH RADIOACTIVE SEED AND RIGHT SENTINEL LYMPH NODE BIOPSY;  Surgeon: Curvin Mt III, MD;  Location: [facility];  Service: General;  Laterality: Right;   BREAST SURGERY     Benign breast lump excised   CERVICAL CONE BIOPSY  1985   severe dysplasia   COLONOSCOPY  2005 and dec 2019   normal   COLPOSCOPY     endoscopy  10/2018   ESOPHAGOGASTRODUODENOSCOPY (EGD) WITH PROPOFOL  N/A 09/12/2020   Procedure: ESOPHAGOGASTRODUODENOSCOPY (EGD) WITH PROPOFOL ;  Surgeon: Teressa Toribio SQUIBB, MD;  Location: [facility];  Service: Endoscopy;  Laterality: N/A;   EUS N/A 09/12/2020   Procedure: UPPER ENDOSCOPIC ULTRASOUND (EUS) RADIAL;  Surgeon: Teressa Toribio SQUIBB, MD;  Location: [facility];  Service: Endoscopy;  Laterality: N/A;   LAPAROSCOPIC BILATERAL SALPINGO OOPHERECTOMY Bilateral 10/27/2019   Procedure: LAPAROSCOPIC BILATERAL SALPINGO OOPHORECTOMY WITH PERITONEAL WASHINGS;  Surgeon: Lavoie, Marie-Lyne, MD;  Location: [facility];  Service: Gynecology;  Laterality: Bilateral;  request 1:00pm on Friday, Dec. 4th in [place] Iqueue time held requests one hour OR time   moles removed from upper back, face lft, 1 between breasts, upper leg left inside  10/18/2019   wearing small round bandaids on for 2 weeks   ROTATOR CUFF REPAIR Bilateral  08/2008   UPPER GASTROINTESTINAL ENDOSCOPY     Social History[1] Family History  Problem Relation Age of Onset   Breast cancer Mother 16   Diabetes Mother    Irritable bowel syndrome Mother    Cancer Mother    Hearing loss Mother    Hyperlipidemia Mother    Miscarriages / Stillbirths Mother    Hypertension Mother    Hypertension Father    Heart disease Father    Hypertension Sister    Anuerysm Sister  head   Depression Sister    Irritable bowel syndrome Sister    Hypertension Sister    Anxiety disorder Sister    Other Sister        prediabetes   Celiac disease Brother    Diabetes Brother    Osteoporosis Maternal Grandmother    Diabetes Maternal Grandmother    Arthritis Maternal Grandmother    Heart disease Maternal Grandmother    COPD Paternal Grandmother    Heart disease Paternal Grandfather    Breast cancer Cousin 108       pat first cousin   Breast cancer Cousin        mat second cousin with breast cancer in her 30s   Thyroid  cancer Cousin 68       pat first cousin   Prostate cancer Other 42   Colon cancer Neg Hx    Stomach cancer Neg Hx    Esophageal cancer Neg Hx    Pancreatic cancer Neg Hx    Rectal cancer Neg Hx    Colon polyps Neg Hx    Crohn's disease Neg Hx    Ulcerative colitis Neg Hx    Allergies[2] Medications Ordered Prior to Encounter[3]  Review of Systems     Objective:   Physical Exam        Assessment & Plan:   Problem List Items Addressed This Visit       Cardiovascular and Mediastinum   Migraine without aura and without status migrainosus, not intractable   Migraine with aura - Primary   Chronic migraine without aura, with intractable migraine, so stated, with status migrainosus      [1]  Social History Tobacco Use   Smoking status: Never    Passive exposure: Never   Smokeless tobacco: Never  Vaping Use   Vaping status: Never Used  Substance Use Topics   Alcohol use: No   Drug use: No  [2]   Allergies Allergen Reactions   Ciprofloxacin Other (See Comments)    Dizziness    Codeine Phosphate Other (See Comments)    Dizziness, heart palpitations, nervous   Decongestant [Pseudoephedrine] Other (See Comments)    dizziness   Diclofenac    Diphenhydramine Hcl (Sleep)    Flonase  [Fluticasone  Propionate] Other (See Comments)    Worsens her migraine    Fluticasone     Hydrocodone  Other (See Comments)    Made nervous--10/09 surgery rotator cuff   Macrobid  [Nitrofurantoin ]     headaches   Nasal Spray    Nasonex  [Mometasone ]     Migraines   Reglan [Metoclopramide] Other (See Comments)    Dizziness   Topamax [Topiramate] Other (See Comments)    Dizziness    Benadryl [Diphenhydramine Hcl] Palpitations   Sulfa Antibiotics Anxiety  [3]  Current Outpatient Medications on File Prior to Visit  Medication Sig Dispense Refill   ALPRAZolam  (XANAX ) 0.5 MG tablet TAKE 1/2 TO 1 TABLET BY MOUTH ONCE DAILY AS NEEDED FOR SLEEP/ANXIETY 15 tablet 2   atenolol  (TENORMIN ) 25 MG tablet Take 1 tablet (25 mg total) by mouth daily. For migraine prevention 90 tablet 4   B Complex Vitamins (B COMPLEX PO) Take by mouth.     baclofen  (LIORESAL ) 10 MG tablet Take 1 tablet (10 mg total) by mouth 3 (three) times daily. 30 each 0   botulinum toxin Type A  (BOTOX ) 200 units injection Inject 200 Units into the muscle every 3 (three) months. Provider to inject botox  in head and neck muscle every 3 months disgard  remaimimg 1 each 2   CALCIUM  CARBONATE-VITAMIN D  PO Take 1 tablet by mouth daily. 1000 mg     Coenzyme Q10 (CO Q 10 PO) Take 1 capsule by mouth daily.     Eptinezumab -jjmr (VYEPTI  IV) Inject into the vein every 3 (three) months.     famotidine  (PEPCID ) 40 MG tablet Take 40 mg by mouth 2 (two) times daily.      hydrocortisone  2.5 % cream Apply to affected areas BID PRN as directed. 30 g 3   hydrOXYzine  (ATARAX ) 25 MG tablet Take 37.5 mg by mouth at bedtime as needed.     ketorolac  (TORADOL ) 10 MG  tablet Take 1 tablet (10 mg total) by mouth every 8 (eight) hours as needed. For migraine. 20 tablet 11   letrozole  (FEMARA ) 2.5 MG tablet TAKE 1 TABLET BY MOUTH EVERY DAY 90 tablet 3   levocetirizine (XYZAL) 5 MG tablet Take 5 mg by mouth at bedtime.   3   levothyroxine  (SYNTHROID ) 75 MCG tablet TAKE 1 TABLET BY MOUTH EVERY DAY BEFORE BREAKFAST 90 tablet 1   Magnesium  Oxide 400 (240 Mg) MG TABS Take 400 mg by mouth daily.      metFORMIN  (GLUCOPHAGE -XR) 500 MG 24 hr tablet TAKE 1 TABLET BY MOUTH EVERY DAY WITH BREAKFAST 90 tablet 1   Multiple Vitamin (MULTIVITAMIN WITH MINERALS) TABS tablet Take 1 tablet by mouth daily.     naratriptan  (AMERGE) 2.5 MG tablet Take 1 tablet (2.5 mg total) by mouth as needed for migraine. Take one (1) tablet at onset of headache; if returns or does not resolve, may repeat after 4 hours; do not exceed five (5) mg in 24 hours. 12 tablet 11   neomycin-polymyxin b-dexamethasone  (MAXITROL) 3.5-10000-0.1 SUSP 1 drop 3 (three) times daily.     Omega-3 Fatty Acids (FISH OIL) 1000 MG CAPS Take 1,000 mg by mouth daily.      OnabotulinumtoxinA  (BOTOX  IJ) Inject 1 Dose as directed every 6 (six) weeks.      ondansetron  (ZOFRAN  ODT) 4 MG disintegrating tablet Take 1 tablet (4 mg total) by mouth every 8 (eight) hours as needed. 20 tablet 0   pantoprazole  (PROTONIX ) 20 MG tablet TAKE 1 TABLET BY MOUTH EVERY DAY 90 tablet 1   predniSONE  (DELTASONE ) 20 MG tablet Take 1 tablet (20 mg total) by mouth daily with breakfast. 5 tablet 0   PRESCRIPTION MEDICATION Inject into the skin every 6 (six) weeks. Steroid Trigger     Probiotic Product (PROBIOTIC-10 PO) Take 1 capsule by mouth daily.      promethazine  (PHENERGAN ) 25 MG tablet Take 25 mg by mouth daily as needed.     triamcinolone  cream (KENALOG ) 0.1 % Apply to affected areas BID PRN for up to two weeks at a time as directed. 30 g 1   TURMERIC PO Take 1 tablet by mouth daily.     VITAMIN D  PO Take 1 capsule by mouth daily.     No  current facility-administered medications on file prior to visit.   "

## 2024-12-15 NOTE — Patient Instructions (Signed)
 Keep up fluids   Sometimes some caffeine  helps during a headache   Remember phenergan  or zofran  if needed   Take the prednisone  taper as directed   Update if not starting to improve in a week or if worsening  Stay in contact with neurology

## 2024-12-15 NOTE — Assessment & Plan Note (Addendum)
 No aura today  Has had headache most of 8 days Took 30 mg of prednisone  at home an dit did help , afraid it will come back without a taper    Continues atenolol  25 mg daily (urged to not miss doses) Phenergan  or zofran  if needed for nausea  Reviewed neurology notes and recent botox  note  Continues nerve blocks (occipital), neuro surg dr Darlis  Vyepti  IV q 3 mo with [facility]-this has started helping  Also botox  -helpful Emerge , toradol  for rescue   Reassuring exam today   Today -prescription prednisone  30 mg taper to help suppress this flare   Call back and Er precautions noted in detail today  Continue neuro and neurosurg f/u

## 2024-12-17 ENCOUNTER — Telehealth: Payer: Self-pay | Admitting: Family Medicine

## 2024-12-17 DIAGNOSIS — M810 Age-related osteoporosis without current pathological fracture: Secondary | ICD-10-CM

## 2024-12-17 DIAGNOSIS — Z79899 Other long term (current) drug therapy: Secondary | ICD-10-CM

## 2024-12-17 DIAGNOSIS — E785 Hyperlipidemia, unspecified: Secondary | ICD-10-CM

## 2024-12-17 DIAGNOSIS — E538 Deficiency of other specified B group vitamins: Secondary | ICD-10-CM

## 2024-12-17 DIAGNOSIS — R7303 Prediabetes: Secondary | ICD-10-CM

## 2024-12-17 DIAGNOSIS — D509 Iron deficiency anemia, unspecified: Secondary | ICD-10-CM

## 2024-12-17 DIAGNOSIS — E039 Hypothyroidism, unspecified: Secondary | ICD-10-CM

## 2024-12-17 NOTE — Telephone Encounter (Signed)
-----   Message from Veva JINNY Ferrari sent at 12/05/2024  3:46 PM EST ----- Regarding: Lab orders for Tue, 1.27.26 Patient is scheduled for CPX labs, please order future labs, Thanks , Veva

## 2024-12-19 ENCOUNTER — Other Ambulatory Visit

## 2024-12-26 ENCOUNTER — Encounter: Admitting: Family Medicine

## 2024-12-27 ENCOUNTER — Emergency Department
Admission: EM | Admit: 2024-12-27 | Discharge: 2024-12-27 | Disposition: A | Discharge status: Home / Self Care | Attending: Emergency Medicine | Admitting: Emergency Medicine

## 2024-12-27 ENCOUNTER — Other Ambulatory Visit: Payer: Self-pay

## 2024-12-27 ENCOUNTER — Encounter: Payer: Self-pay | Admitting: Medical Oncology

## 2024-12-27 DIAGNOSIS — G43909 Migraine, unspecified, not intractable, without status migrainosus: Secondary | ICD-10-CM | POA: Insufficient documentation

## 2024-12-27 DIAGNOSIS — H9201 Otalgia, right ear: Secondary | ICD-10-CM | POA: Insufficient documentation

## 2024-12-27 MED ORDER — METHYLPREDNISOLONE SODIUM SUCC 125 MG IJ SOLR
125.0000 mg | Freq: Once | INTRAMUSCULAR | Status: DC
  Filled 2024-12-27: qty 2

## 2024-12-27 MED ORDER — NEOMYCIN-POLYMYXIN-HC 3.5-10000-1 OT SOLN: 3.0000 [drp] | Freq: Three times a day (TID) | OTIC | 0 refills | Status: DC

## 2024-12-27 MED ORDER — ONDANSETRON HCL 4 MG/2ML IJ SOLN
4.0000 mg | Freq: Once | INTRAMUSCULAR | Status: AC
  Administered 2024-12-27: 4 mg via INTRAVENOUS
  Filled 2024-12-27: qty 2

## 2024-12-27 MED ORDER — KETOROLAC TROMETHAMINE 15 MG/ML IJ SOLN
15.0000 mg | Freq: Once | INTRAMUSCULAR | Status: AC
  Administered 2024-12-27: 15 mg via INTRAVENOUS
  Filled 2024-12-27: qty 1

## 2024-12-27 MED ORDER — NEOMYCIN-POLYMYXIN-HC 3.5-10000-1 OT SOLN: 3.0000 [drp] | Freq: Three times a day (TID) | OTIC | 0 refills | Status: AC

## 2024-12-27 MED ORDER — DEXAMETHASONE SOD PHOSPHATE PF 10 MG/ML IJ SOLN
10.0000 mg | Freq: Once | INTRAMUSCULAR | Status: AC
  Administered 2024-12-27: 10 mg via INTRAVENOUS
  Filled 2024-12-27: qty 1

## 2024-12-27 NOTE — Discharge Instructions (Addendum)
 Follow-up with your primary care provider.  We have also given you referral information for ENT.  We have prescribed Cortisporin eardrops since with the tenderness on examination of your right ear it is possibility you are having an external ear infection.  Return to the ER immediately for new, worsening, or persistent severe headache, ear pain or hearing loss, dizziness, or any other new or worsening symptoms that concern you.

## 2024-12-27 NOTE — ED Triage Notes (Signed)
 Pt reports migraine x 5 days with hx of migraines. Taking prescribed med for it without relief also reports nausea and light sensitivity, no vomiting.

## 2024-12-27 NOTE — ED Provider Notes (Signed)
 "  [facility] Provider Note    Event Date/Time   First MD Initiated Contact with Patient 12/27/24 [phone]     (approximate)   History   Migraine   HPI  Jennifer [name] is a 69 y.o. female with a history of migraines who presents with a headache for the last 7 days, mainly on the right side of her head, constant, associated with some nausea and photophobia.  The patient states that her recent migraines have been more on the left, but other than this, the intensity and quality of the migraine is similar to prior.  She also reports some right ear pain that has been present for several months.  The patient reports that she gets quarterly Vyepti  infusions but is almost due for her next infusion and so thinks that she may be having more migraines now because this medicine has worn off.  I reviewed the past medical records.  The patient's most recent outpatient encounter was with primary care on 1/23 for an acute headache.  The patient received a prednisone  taper at that time.  She follows with neurology and with neurosurgery for a simple nerve blocks.  Physical Exam   Triage Vital Signs: ED Triage Vitals  Encounter Vitals Group     BP 12/27/24 0829 (!) 155/74     Girls Systolic BP Percentile --      Girls Diastolic BP Percentile --      Boys Systolic BP Percentile --      Boys Diastolic BP Percentile --      Pulse Rate 12/27/24 0829 63     Resp 12/27/24 0829 16     Temp 12/27/24 0829 98.4 F (36.9 C)     Temp Source 12/27/24 0829 Oral     SpO2 12/27/24 0829 100 %     Weight 12/27/24 0828 110 lb (49.9 kg)     Height 12/27/24 0828 4' 11 (1.499 m)     Head Circumference --      Peak Flow --      Pain Score 12/27/24 0828 3     Pain Loc --      Pain Education --      Exclude from Growth Chart --     Most recent vital signs: Vitals:   12/27/24 0829  BP: (!) 155/74  Pulse: 63  Resp: 16  Temp: 98.4 F (36.9 C)  SpO2: 100%     General: Alert,  well-appearing, no distress.  CV:  Good peripheral perfusion.  Resp:  Normal effort.  Abd:  No distention.  Other:  EOMI.  PERRLA.  No significant photophobia.  No facial droop.  Normal speech.  Motor intact in all extremities.  No ataxia on finger-to-nose.  Right TM appears normal.  Ear canal with slight erythema and mild tenderness to the tragus.   ED Results / Procedures / Treatments   Labs (all labs ordered are listed, but only abnormal results are displayed) Labs Reviewed - No data to display   EKG     RADIOLOGY   PROCEDURES:  Critical Care performed: No  Procedures   MEDICATIONS ORDERED IN ED: Medications  ondansetron  (ZOFRAN ) injection 4 mg (4 mg Intravenous Given 12/27/24 1004)  ketorolac  (TORADOL ) 15 MG/ML injection 15 mg (15 mg Intravenous Given 12/27/24 1004)  dexamethasone  (DECADRON ) injection 10 mg (10 mg Intravenous Given 12/27/24 1020)     IMPRESSION / MDM / ASSESSMENT AND PLAN / ED COURSE  I reviewed the triage vital signs  and the nursing notes.  69 year old female with PMH as noted above presents with a headache over the last 7 days, similar to prior migraines.  It is not relieved by her triptan medication or Toradol  at home.  The neurologic exam is nonfocal and the patient is overall well-appearing.  Differential diagnosis includes, but is not limited to, migraine headache, cluster headache, tension headache.  There is no evidence of SAH or meningitis.  There is no indication for imaging at this time.  We will give a migraine cocktail and reassess.    The patient also reports some subacute right ear pain.  Although the TM appears normal, she is slightly tender on exam.  This could raise the possibility of an otitis externa.  Patient's presentation is most consistent with severe exacerbation of chronic illness.  ----------------------------------------- 11:47 AM on 12/27/2024 -----------------------------------------  The patient is feeling markedly  better.  She is eager to go home.  She is stable for discharge at this time.  I have prescribed Cortisporin otic for possible right otitis externa and given her an ENT referral.  There is no need to do a steroid taper given that she got dexamethasone .  I answered all of the patient's questions, gave strict return precautions, and she expressed understanding.   FINAL CLINICAL IMPRESSION(S) / ED DIAGNOSES   Final diagnoses:  Migraine without status migrainosus, not intractable, unspecified migraine type  Otalgia, right     Rx / DC Orders   ED Discharge Orders          Ordered    neomycin -polymyxin-hydrocortisone  (CORTISPORIN) OTIC solution  3 times daily,   Status:  Discontinued        12/27/24 1144    neomycin -polymyxin-hydrocortisone  (CORTISPORIN) OTIC solution  3 times daily,   Status:  Discontinued        12/27/24 1145    neomycin -polymyxin-hydrocortisone  (CORTISPORIN) OTIC solution  3 times daily        12/27/24 1146             Note:  This document was prepared using Dragon voice recognition software and may include unintentional dictation errors.    Jacolyn Pae, MD 12/27/24 1148  "

## 2024-12-28 NOTE — Progress Notes (Signed)
 Jennifer [name]                                          MRN: [id number]   12/28/2024   The [facility] Quality Team Specialist reviewed this patient medical record for the purposes of chart review for care gap closure. The following were reviewed: chart review for care gap closure-kidney health evaluation for diabetes:eGFR  and uACR.    [facility] Quality Team

## 2024-12-29 ENCOUNTER — Encounter: Payer: Self-pay | Admitting: Hematology and Oncology

## 2025-01-01 ENCOUNTER — Other Ambulatory Visit

## 2025-01-03 ENCOUNTER — Encounter: Admitting: Family Medicine

## 2025-01-05 ENCOUNTER — Encounter: Admitting: Family Medicine

## 2025-02-07 ENCOUNTER — Telehealth: Admitting: Neurology

## 2025-05-21 ENCOUNTER — Inpatient Hospital Stay

## 2025-05-21 ENCOUNTER — Inpatient Hospital Stay: Admitting: Hematology and Oncology
